# Patient Record
Sex: Female | Born: 1950 | Race: White | Hispanic: No | Smoking: Current every day smoker
Health system: Southern US, Community
[De-identification: ages and names within clinical notes are randomized; demographics above are authoritative.]

## PROBLEM LIST (undated history)

## (undated) DIAGNOSIS — K267 Chronic duodenal ulcer without hemorrhage or perforation: Secondary | ICD-10-CM

## (undated) DIAGNOSIS — M1990 Unspecified osteoarthritis, unspecified site: Secondary | ICD-10-CM

## (undated) DIAGNOSIS — R45851 Suicidal ideations: Secondary | ICD-10-CM

## (undated) DIAGNOSIS — J449 Chronic obstructive pulmonary disease, unspecified: Secondary | ICD-10-CM

## (undated) DIAGNOSIS — F419 Anxiety disorder, unspecified: Secondary | ICD-10-CM

## (undated) DIAGNOSIS — M545 Low back pain, unspecified: Secondary | ICD-10-CM

## (undated) DIAGNOSIS — M5430 Sciatica, unspecified side: Secondary | ICD-10-CM

## (undated) DIAGNOSIS — Z9289 Personal history of other medical treatment: Secondary | ICD-10-CM

## (undated) DIAGNOSIS — J189 Pneumonia, unspecified organism: Secondary | ICD-10-CM

## (undated) DIAGNOSIS — F329 Major depressive disorder, single episode, unspecified: Secondary | ICD-10-CM

## (undated) DIAGNOSIS — R7989 Other specified abnormal findings of blood chemistry: Secondary | ICD-10-CM

## (undated) DIAGNOSIS — Z9981 Dependence on supplemental oxygen: Secondary | ICD-10-CM

## (undated) DIAGNOSIS — R0602 Shortness of breath: Secondary | ICD-10-CM

## (undated) DIAGNOSIS — M810 Age-related osteoporosis without current pathological fracture: Secondary | ICD-10-CM

## (undated) DIAGNOSIS — R945 Abnormal results of liver function studies: Secondary | ICD-10-CM

## (undated) DIAGNOSIS — F319 Bipolar disorder, unspecified: Secondary | ICD-10-CM

## (undated) DIAGNOSIS — G894 Chronic pain syndrome: Secondary | ICD-10-CM

## (undated) DIAGNOSIS — E0590 Thyrotoxicosis, unspecified without thyrotoxic crisis or storm: Secondary | ICD-10-CM

## (undated) DIAGNOSIS — G8929 Other chronic pain: Secondary | ICD-10-CM

## (undated) DIAGNOSIS — F32A Depression, unspecified: Secondary | ICD-10-CM

## (undated) DIAGNOSIS — D649 Anemia, unspecified: Secondary | ICD-10-CM

## (undated) DIAGNOSIS — E559 Vitamin D deficiency, unspecified: Secondary | ICD-10-CM

## (undated) DIAGNOSIS — F1910 Other psychoactive substance abuse, uncomplicated: Secondary | ICD-10-CM

## (undated) HISTORY — DX: Other specified abnormal findings of blood chemistry: R79.89

## (undated) HISTORY — DX: Abnormal results of liver function studies: R94.5

## (undated) HISTORY — DX: Anemia, unspecified: D64.9

## (undated) HISTORY — DX: Major depressive disorder, single episode, unspecified: F32.9

## (undated) HISTORY — DX: Other psychoactive substance abuse, uncomplicated: F19.10

## (undated) HISTORY — PX: FRACTURE SURGERY: SHX138

## (undated) HISTORY — PX: AUGMENTATION MAMMAPLASTY: SUR837

## (undated) HISTORY — DX: Sciatica, unspecified side: M54.30

## (undated) HISTORY — DX: Suicidal ideations: R45.851

## (undated) HISTORY — DX: Age-related osteoporosis without current pathological fracture: M81.0

## (undated) HISTORY — PX: CATARACT EXTRACTION W/ INTRAOCULAR LENS  IMPLANT, BILATERAL: SHX1307

## (undated) HISTORY — DX: Bipolar disorder, unspecified: F31.9

## (undated) HISTORY — DX: Depression, unspecified: F32.A

---

## 1977-03-14 HISTORY — PX: TUBAL LIGATION: SHX77

## 1999-08-30 ENCOUNTER — Encounter: Payer: Self-pay | Admitting: Emergency Medicine

## 1999-08-30 ENCOUNTER — Emergency Department (HOSPITAL_COMMUNITY): Admission: EM | Admit: 1999-08-30 | Discharge: 1999-08-30 | Payer: Self-pay | Admitting: Emergency Medicine

## 2005-03-14 DIAGNOSIS — R45851 Suicidal ideations: Secondary | ICD-10-CM

## 2005-03-14 HISTORY — DX: Suicidal ideations: R45.851

## 2005-12-02 ENCOUNTER — Ambulatory Visit: Payer: Self-pay | Admitting: Hospitalist

## 2005-12-02 ENCOUNTER — Ambulatory Visit (HOSPITAL_COMMUNITY): Admission: RE | Admit: 2005-12-02 | Discharge: 2005-12-02 | Payer: Self-pay | Admitting: Hospitalist

## 2005-12-06 ENCOUNTER — Ambulatory Visit: Payer: Self-pay | Admitting: Hospitalist

## 2005-12-07 ENCOUNTER — Encounter: Payer: Self-pay | Admitting: Internal Medicine

## 2005-12-07 DIAGNOSIS — M549 Dorsalgia, unspecified: Secondary | ICD-10-CM

## 2005-12-07 DIAGNOSIS — G8929 Other chronic pain: Secondary | ICD-10-CM

## 2005-12-09 ENCOUNTER — Ambulatory Visit: Payer: Self-pay | Admitting: Internal Medicine

## 2005-12-09 ENCOUNTER — Ambulatory Visit (HOSPITAL_COMMUNITY): Admission: RE | Admit: 2005-12-09 | Discharge: 2005-12-09 | Payer: Self-pay | Admitting: Internal Medicine

## 2005-12-20 ENCOUNTER — Ambulatory Visit: Payer: Self-pay | Admitting: Internal Medicine

## 2006-01-03 ENCOUNTER — Ambulatory Visit: Payer: Self-pay | Admitting: Internal Medicine

## 2006-01-03 ENCOUNTER — Encounter (INDEPENDENT_AMBULATORY_CARE_PROVIDER_SITE_OTHER): Payer: Self-pay | Admitting: Pulmonary Disease

## 2006-01-03 LAB — CONVERTED CEMR LAB
HCT: 43 % (ref 36.0–46.0)
Leukocyte count, blood: 5.3 10*9/L (ref 4.0–10.5)
MCV: 92.1 fL (ref 78.0–100.0)
Platelets: 362 10*3/uL (ref 150–400)
RBC: 4.67 M/uL (ref 3.87–5.11)

## 2006-01-25 ENCOUNTER — Ambulatory Visit: Payer: Self-pay | Admitting: Internal Medicine

## 2006-06-01 ENCOUNTER — Ambulatory Visit: Payer: Self-pay | Admitting: Internal Medicine

## 2006-06-01 ENCOUNTER — Encounter (INDEPENDENT_AMBULATORY_CARE_PROVIDER_SITE_OTHER): Payer: Self-pay | Admitting: Internal Medicine

## 2006-06-12 ENCOUNTER — Telehealth: Payer: Self-pay | Admitting: *Deleted

## 2006-10-23 ENCOUNTER — Telehealth: Payer: Self-pay | Admitting: *Deleted

## 2006-11-20 ENCOUNTER — Telehealth (INDEPENDENT_AMBULATORY_CARE_PROVIDER_SITE_OTHER): Payer: Self-pay | Admitting: *Deleted

## 2007-01-01 ENCOUNTER — Encounter (INDEPENDENT_AMBULATORY_CARE_PROVIDER_SITE_OTHER): Payer: Self-pay | Admitting: Internal Medicine

## 2007-01-01 ENCOUNTER — Ambulatory Visit: Payer: Self-pay | Admitting: *Deleted

## 2007-01-03 ENCOUNTER — Encounter (INDEPENDENT_AMBULATORY_CARE_PROVIDER_SITE_OTHER): Payer: Self-pay | Admitting: Internal Medicine

## 2007-01-03 DIAGNOSIS — E559 Vitamin D deficiency, unspecified: Secondary | ICD-10-CM

## 2007-01-03 HISTORY — DX: Vitamin D deficiency, unspecified: E55.9

## 2007-01-03 LAB — CONVERTED CEMR LAB
Calcium: 8.6 mg/dL (ref 8.4–10.5)
Creatinine, Ser: 0.66 mg/dL (ref 0.40–1.20)
HCT: 40 % (ref 36.0–46.0)
MCHC: 33.8 g/dL (ref 30.0–36.0)
MCV: 92 fL (ref 78.0–100.0)
Platelets: 355 10*3/uL (ref 150–400)
RDW: 12.8 % (ref 11.5–14.0)

## 2007-02-18 ENCOUNTER — Encounter (INDEPENDENT_AMBULATORY_CARE_PROVIDER_SITE_OTHER): Payer: Self-pay | Admitting: Internal Medicine

## 2007-03-01 ENCOUNTER — Ambulatory Visit (HOSPITAL_COMMUNITY): Admission: RE | Admit: 2007-03-01 | Discharge: 2007-03-01 | Payer: Self-pay | Admitting: Internal Medicine

## 2007-04-19 ENCOUNTER — Encounter (INDEPENDENT_AMBULATORY_CARE_PROVIDER_SITE_OTHER): Payer: Self-pay | Admitting: Internal Medicine

## 2007-04-19 ENCOUNTER — Ambulatory Visit: Payer: Self-pay | Admitting: Hospitalist

## 2007-05-18 ENCOUNTER — Telehealth: Payer: Self-pay | Admitting: *Deleted

## 2007-05-23 ENCOUNTER — Encounter: Admission: RE | Admit: 2007-05-23 | Discharge: 2007-05-23 | Payer: Self-pay | Admitting: Sports Medicine

## 2007-06-11 ENCOUNTER — Encounter: Admission: RE | Admit: 2007-06-11 | Discharge: 2007-06-11 | Payer: Self-pay | Admitting: Sports Medicine

## 2007-10-19 ENCOUNTER — Encounter (INDEPENDENT_AMBULATORY_CARE_PROVIDER_SITE_OTHER): Payer: Self-pay | Admitting: Internal Medicine

## 2007-10-19 ENCOUNTER — Ambulatory Visit: Payer: Self-pay | Admitting: Internal Medicine

## 2007-10-20 LAB — CONVERTED CEMR LAB
ALT: 13 units/L (ref 0–35)
Albumin: 4.5 g/dL (ref 3.5–5.2)
Alkaline Phosphatase: 167 units/L — ABNORMAL HIGH (ref 39–117)
CO2: 23 meq/L (ref 19–32)
Cholesterol: 210 mg/dL — ABNORMAL HIGH (ref 0–200)
Glucose, Bld: 103 mg/dL — ABNORMAL HIGH (ref 70–99)
LDL Cholesterol: 125 mg/dL — ABNORMAL HIGH (ref 0–99)
Potassium: 4 meq/L (ref 3.5–5.3)
RBC: 4.22 M/uL (ref 3.87–5.11)
Sodium: 137 meq/L (ref 135–145)
Total Protein: 7 g/dL (ref 6.0–8.3)
Triglycerides: 51 mg/dL (ref <150)
WBC: 12.9 10*3/uL — ABNORMAL HIGH (ref 4.0–10.5)

## 2007-12-03 ENCOUNTER — Ambulatory Visit: Payer: Self-pay | Admitting: Internal Medicine

## 2007-12-03 ENCOUNTER — Encounter (INDEPENDENT_AMBULATORY_CARE_PROVIDER_SITE_OTHER): Payer: Self-pay | Admitting: Internal Medicine

## 2007-12-31 ENCOUNTER — Telehealth (INDEPENDENT_AMBULATORY_CARE_PROVIDER_SITE_OTHER): Payer: Self-pay | Admitting: Internal Medicine

## 2008-01-01 ENCOUNTER — Encounter (INDEPENDENT_AMBULATORY_CARE_PROVIDER_SITE_OTHER): Payer: Self-pay | Admitting: Internal Medicine

## 2008-01-16 ENCOUNTER — Encounter (INDEPENDENT_AMBULATORY_CARE_PROVIDER_SITE_OTHER): Payer: Self-pay | Admitting: Internal Medicine

## 2008-02-26 ENCOUNTER — Ambulatory Visit: Payer: Self-pay | Admitting: Internal Medicine

## 2008-02-26 ENCOUNTER — Encounter (INDEPENDENT_AMBULATORY_CARE_PROVIDER_SITE_OTHER): Payer: Self-pay | Admitting: Internal Medicine

## 2008-03-28 ENCOUNTER — Encounter (INDEPENDENT_AMBULATORY_CARE_PROVIDER_SITE_OTHER): Payer: Self-pay | Admitting: Internal Medicine

## 2008-03-28 ENCOUNTER — Ambulatory Visit: Payer: Self-pay | Admitting: Infectious Disease

## 2008-03-28 LAB — HM PAP SMEAR: HM Pap smear: NEGATIVE

## 2008-03-29 ENCOUNTER — Encounter (INDEPENDENT_AMBULATORY_CARE_PROVIDER_SITE_OTHER): Payer: Self-pay | Admitting: Internal Medicine

## 2008-03-29 LAB — CONVERTED CEMR LAB
Candida species: NEGATIVE
Gardnerella vaginalis: NEGATIVE
Trichomonal Vaginitis: NEGATIVE

## 2008-04-15 ENCOUNTER — Encounter: Payer: Self-pay | Admitting: Licensed Clinical Social Worker

## 2008-04-30 ENCOUNTER — Telehealth (INDEPENDENT_AMBULATORY_CARE_PROVIDER_SITE_OTHER): Payer: Self-pay | Admitting: Internal Medicine

## 2008-04-30 ENCOUNTER — Encounter (INDEPENDENT_AMBULATORY_CARE_PROVIDER_SITE_OTHER): Payer: Self-pay | Admitting: Internal Medicine

## 2008-05-12 ENCOUNTER — Ambulatory Visit: Payer: Self-pay | Admitting: Internal Medicine

## 2008-05-12 ENCOUNTER — Encounter (INDEPENDENT_AMBULATORY_CARE_PROVIDER_SITE_OTHER): Payer: Self-pay | Admitting: Internal Medicine

## 2008-05-15 ENCOUNTER — Telehealth: Payer: Self-pay | Admitting: Licensed Clinical Social Worker

## 2008-05-30 ENCOUNTER — Telehealth (INDEPENDENT_AMBULATORY_CARE_PROVIDER_SITE_OTHER): Payer: Self-pay | Admitting: *Deleted

## 2008-05-30 ENCOUNTER — Encounter: Payer: Self-pay | Admitting: Internal Medicine

## 2008-06-04 ENCOUNTER — Encounter (INDEPENDENT_AMBULATORY_CARE_PROVIDER_SITE_OTHER): Payer: Self-pay | Admitting: Internal Medicine

## 2008-06-04 ENCOUNTER — Telehealth (INDEPENDENT_AMBULATORY_CARE_PROVIDER_SITE_OTHER): Payer: Self-pay | Admitting: Internal Medicine

## 2008-06-23 ENCOUNTER — Ambulatory Visit: Payer: Self-pay | Admitting: Internal Medicine

## 2008-07-08 ENCOUNTER — Ambulatory Visit: Payer: Self-pay | Admitting: Internal Medicine

## 2008-07-08 ENCOUNTER — Encounter (INDEPENDENT_AMBULATORY_CARE_PROVIDER_SITE_OTHER): Payer: Self-pay | Admitting: Internal Medicine

## 2008-07-11 ENCOUNTER — Encounter (INDEPENDENT_AMBULATORY_CARE_PROVIDER_SITE_OTHER): Payer: Self-pay | Admitting: Internal Medicine

## 2008-07-24 ENCOUNTER — Telehealth (INDEPENDENT_AMBULATORY_CARE_PROVIDER_SITE_OTHER): Payer: Self-pay | Admitting: Internal Medicine

## 2008-07-24 ENCOUNTER — Encounter (INDEPENDENT_AMBULATORY_CARE_PROVIDER_SITE_OTHER): Payer: Self-pay | Admitting: Internal Medicine

## 2008-08-07 ENCOUNTER — Ambulatory Visit: Payer: Self-pay | Admitting: *Deleted

## 2008-08-07 ENCOUNTER — Ambulatory Visit (HOSPITAL_COMMUNITY): Admission: RE | Admit: 2008-08-07 | Discharge: 2008-08-07 | Payer: Self-pay | Admitting: *Deleted

## 2008-08-21 ENCOUNTER — Encounter (HOSPITAL_COMMUNITY): Admission: RE | Admit: 2008-08-21 | Discharge: 2008-11-19 | Payer: Self-pay | Admitting: Orthopedic Surgery

## 2008-08-25 ENCOUNTER — Ambulatory Visit: Payer: Self-pay | Admitting: Internal Medicine

## 2008-09-04 ENCOUNTER — Encounter: Admission: RE | Admit: 2008-09-04 | Discharge: 2008-09-04 | Payer: Self-pay | Admitting: Orthopedic Surgery

## 2008-09-24 ENCOUNTER — Telehealth: Payer: Self-pay | Admitting: *Deleted

## 2008-09-29 ENCOUNTER — Encounter (INDEPENDENT_AMBULATORY_CARE_PROVIDER_SITE_OTHER): Payer: Self-pay | Admitting: Internal Medicine

## 2008-09-29 ENCOUNTER — Ambulatory Visit: Payer: Self-pay | Admitting: Internal Medicine

## 2008-09-29 LAB — CONVERTED CEMR LAB: HDL: 73 mg/dL (ref >39)

## 2008-10-06 ENCOUNTER — Ambulatory Visit (HOSPITAL_COMMUNITY): Admission: RE | Admit: 2008-10-06 | Discharge: 2008-10-06 | Payer: Self-pay | Admitting: Internal Medicine

## 2008-10-06 LAB — HM MAMMOGRAPHY

## 2008-10-23 ENCOUNTER — Telehealth: Payer: Self-pay | Admitting: *Deleted

## 2008-10-30 ENCOUNTER — Encounter: Admission: RE | Admit: 2008-10-30 | Discharge: 2008-10-30 | Payer: Self-pay | Admitting: Orthopedic Surgery

## 2008-10-30 ENCOUNTER — Encounter (INDEPENDENT_AMBULATORY_CARE_PROVIDER_SITE_OTHER): Payer: Self-pay | Admitting: Internal Medicine

## 2008-11-04 ENCOUNTER — Encounter (INDEPENDENT_AMBULATORY_CARE_PROVIDER_SITE_OTHER): Payer: Self-pay | Admitting: Internal Medicine

## 2008-11-25 ENCOUNTER — Encounter
Admission: RE | Admit: 2008-11-25 | Discharge: 2009-02-11 | Payer: Self-pay | Admitting: Physical Medicine & Rehabilitation

## 2008-11-27 ENCOUNTER — Ambulatory Visit: Payer: Self-pay | Admitting: Physical Medicine & Rehabilitation

## 2008-12-11 ENCOUNTER — Encounter (INDEPENDENT_AMBULATORY_CARE_PROVIDER_SITE_OTHER): Payer: Self-pay | Admitting: Internal Medicine

## 2008-12-15 ENCOUNTER — Ambulatory Visit: Payer: Self-pay | Admitting: Physical Medicine & Rehabilitation

## 2009-01-09 ENCOUNTER — Ambulatory Visit: Payer: Self-pay | Admitting: Internal Medicine

## 2009-01-12 ENCOUNTER — Ambulatory Visit: Payer: Self-pay | Admitting: Physical Medicine & Rehabilitation

## 2009-01-13 ENCOUNTER — Telehealth (INDEPENDENT_AMBULATORY_CARE_PROVIDER_SITE_OTHER): Payer: Self-pay | Admitting: Internal Medicine

## 2009-01-14 ENCOUNTER — Ambulatory Visit: Payer: Self-pay | Admitting: Infectious Disease

## 2009-02-11 ENCOUNTER — Encounter
Admission: RE | Admit: 2009-02-11 | Discharge: 2009-03-05 | Payer: Self-pay | Admitting: Physical Medicine & Rehabilitation

## 2009-02-12 ENCOUNTER — Ambulatory Visit: Payer: Self-pay | Admitting: Physical Medicine & Rehabilitation

## 2009-03-16 ENCOUNTER — Encounter
Admission: RE | Admit: 2009-03-16 | Discharge: 2009-05-11 | Payer: Self-pay | Admitting: Physical Medicine & Rehabilitation

## 2009-03-16 ENCOUNTER — Ambulatory Visit: Payer: Self-pay | Admitting: Physical Medicine & Rehabilitation

## 2009-04-15 ENCOUNTER — Ambulatory Visit: Payer: Self-pay | Admitting: Physical Medicine & Rehabilitation

## 2009-05-11 ENCOUNTER — Encounter
Admission: RE | Admit: 2009-05-11 | Discharge: 2009-08-09 | Payer: Self-pay | Admitting: Physical Medicine & Rehabilitation

## 2009-05-13 ENCOUNTER — Ambulatory Visit: Payer: Self-pay | Admitting: Physical Medicine & Rehabilitation

## 2009-06-02 ENCOUNTER — Ambulatory Visit: Payer: Self-pay | Admitting: Internal Medicine

## 2009-06-02 DIAGNOSIS — F3130 Bipolar disorder, current episode depressed, mild or moderate severity, unspecified: Secondary | ICD-10-CM

## 2009-06-02 LAB — CONVERTED CEMR LAB: Hgb A1c MFr Bld: 5.7 %

## 2009-06-03 LAB — CONVERTED CEMR LAB
ALT: 13 units/L (ref 0–35)
AST: 17 units/L (ref 0–37)
Albumin: 4.4 g/dL (ref 3.5–5.2)
CO2: 24 meq/L (ref 19–32)
Calcium: 8.8 mg/dL (ref 8.4–10.5)
Chloride: 97 meq/L (ref 96–112)
Cholesterol: 176 mg/dL (ref 0–200)
Creatinine, Ser: 0.6 mg/dL (ref 0.40–1.20)
Potassium: 4.6 meq/L (ref 3.5–5.3)
TSH: 1.079 microintl units/mL (ref 0.350–4.5)
Total CHOL/HDL Ratio: 2.4
Total Protein: 6.6 g/dL (ref 6.0–8.3)
Vit D, 25-Hydroxy: <10 ng/mL — ABNORMAL LOW (ref 30–89)

## 2009-06-04 ENCOUNTER — Ambulatory Visit (HOSPITAL_COMMUNITY): Admission: RE | Admit: 2009-06-04 | Discharge: 2009-06-04 | Payer: Self-pay | Admitting: Internal Medicine

## 2009-06-08 ENCOUNTER — Ambulatory Visit: Payer: Self-pay | Admitting: Physical Medicine & Rehabilitation

## 2009-06-11 ENCOUNTER — Encounter (INDEPENDENT_AMBULATORY_CARE_PROVIDER_SITE_OTHER): Payer: Self-pay | Admitting: Internal Medicine

## 2009-06-17 DIAGNOSIS — M810 Age-related osteoporosis without current pathological fracture: Secondary | ICD-10-CM

## 2009-06-17 HISTORY — DX: Age-related osteoporosis without current pathological fracture: M81.0

## 2009-07-08 ENCOUNTER — Ambulatory Visit: Payer: Self-pay | Admitting: Physical Medicine & Rehabilitation

## 2009-07-31 ENCOUNTER — Ambulatory Visit: Payer: Self-pay | Admitting: Physical Medicine & Rehabilitation

## 2009-08-28 ENCOUNTER — Encounter
Admission: RE | Admit: 2009-08-28 | Discharge: 2009-11-26 | Payer: Self-pay | Admitting: Physical Medicine & Rehabilitation

## 2009-09-03 ENCOUNTER — Ambulatory Visit: Payer: Self-pay | Admitting: Physical Medicine & Rehabilitation

## 2009-09-30 ENCOUNTER — Ambulatory Visit: Payer: Self-pay | Admitting: Physical Medicine & Rehabilitation

## 2009-10-16 ENCOUNTER — Encounter: Payer: Self-pay | Admitting: Internal Medicine

## 2009-10-16 ENCOUNTER — Ambulatory Visit: Payer: Self-pay | Admitting: Internal Medicine

## 2009-10-16 ENCOUNTER — Encounter: Payer: Self-pay | Admitting: Licensed Clinical Social Worker

## 2009-10-16 DIAGNOSIS — M549 Dorsalgia, unspecified: Secondary | ICD-10-CM | POA: Insufficient documentation

## 2009-10-16 LAB — CONVERTED CEMR LAB: Vit D, 25-Hydroxy: 42 ng/mL (ref 30–89)

## 2009-10-28 ENCOUNTER — Ambulatory Visit: Payer: Self-pay | Admitting: Physical Medicine & Rehabilitation

## 2009-11-24 ENCOUNTER — Encounter
Admission: RE | Admit: 2009-11-24 | Discharge: 2010-02-22 | Payer: Self-pay | Source: Home / Self Care | Attending: Physical Medicine & Rehabilitation | Admitting: Physical Medicine & Rehabilitation

## 2009-11-27 ENCOUNTER — Ambulatory Visit: Payer: Self-pay | Admitting: Physical Medicine & Rehabilitation

## 2009-12-15 ENCOUNTER — Ambulatory Visit: Payer: Self-pay | Admitting: Physical Medicine & Rehabilitation

## 2010-01-18 ENCOUNTER — Ambulatory Visit: Payer: Self-pay | Admitting: Physical Medicine & Rehabilitation

## 2010-01-28 ENCOUNTER — Telehealth: Payer: Self-pay | Admitting: Licensed Clinical Social Worker

## 2010-02-02 ENCOUNTER — Telehealth: Payer: Self-pay | Admitting: Licensed Clinical Social Worker

## 2010-02-15 ENCOUNTER — Ambulatory Visit: Payer: Self-pay | Admitting: Physical Medicine & Rehabilitation

## 2010-03-23 ENCOUNTER — Encounter: Payer: Self-pay | Admitting: Ophthalmology

## 2010-03-23 ENCOUNTER — Encounter
Admission: RE | Admit: 2010-03-23 | Discharge: 2010-04-13 | Payer: Self-pay | Source: Home / Self Care | Attending: Physical Medicine & Rehabilitation | Admitting: Physical Medicine & Rehabilitation

## 2010-03-23 ENCOUNTER — Inpatient Hospital Stay (HOSPITAL_COMMUNITY)
Admission: EM | Admit: 2010-03-23 | Discharge: 2010-03-25 | Payer: Self-pay | Source: Home / Self Care | Attending: Internal Medicine | Admitting: Internal Medicine

## 2010-03-24 ENCOUNTER — Ambulatory Visit: Admit: 2010-03-24 | Payer: Self-pay

## 2010-03-25 ENCOUNTER — Encounter: Payer: Self-pay | Admitting: Ophthalmology

## 2010-03-25 ENCOUNTER — Ambulatory Visit: Admit: 2010-03-25 | Payer: Self-pay | Admitting: Physical Medicine & Rehabilitation

## 2010-03-25 DIAGNOSIS — I6200 Nontraumatic subdural hemorrhage, unspecified: Secondary | ICD-10-CM | POA: Insufficient documentation

## 2010-03-29 LAB — URINALYSIS, ROUTINE W REFLEX MICROSCOPIC
Bilirubin Urine: NEGATIVE
Ketones, ur: NEGATIVE mg/dL
Leukocytes, UA: NEGATIVE
Nitrite: NEGATIVE
Protein, ur: NEGATIVE mg/dL
Specific Gravity, Urine: 1.006 (ref 1.005–1.030)
Urine Glucose, Fasting: NEGATIVE mg/dL
Urobilinogen, UA: 0.2 mg/dL (ref 0.0–1.0)
pH: 7.5 (ref 5.0–8.0)

## 2010-03-29 LAB — POCT I-STAT, CHEM 8
BUN: <3 mg/dL — ABNORMAL LOW (ref 6–23)
Calcium, Ion: 1.05 mmol/L — ABNORMAL LOW (ref 1.12–1.32)
Chloride: 96 mEq/L (ref 96–112)
Creatinine, Ser: 0.8 mg/dL (ref 0.4–1.2)
Glucose, Bld: 91 mg/dL (ref 70–99)
HCT: 40 % (ref 36.0–46.0)
Hemoglobin: 13.6 g/dL (ref 12.0–15.0)
Potassium: 4.2 mEq/L (ref 3.5–5.1)
Sodium: 140 mEq/L (ref 135–145)
TCO2: 37 mmol/L (ref 0–100)

## 2010-03-29 LAB — COMPREHENSIVE METABOLIC PANEL
ALT: 17 U/L (ref 0–35)
ALT: 14 U/L (ref 0–35)
AST: 20 U/L (ref 0–37)
AST: 15 U/L (ref 0–37)
Albumin: 3.4 g/dL — ABNORMAL LOW (ref 3.5–5.2)
Albumin: 2.9 g/dL — ABNORMAL LOW (ref 3.5–5.2)
Alkaline Phosphatase: 101 U/L (ref 39–117)
Alkaline Phosphatase: 93 U/L (ref 39–117)
BUN: 2 mg/dL — ABNORMAL LOW (ref 6–23)
BUN: 6 mg/dL (ref 6–23)
CO2: 33 mEq/L — ABNORMAL HIGH (ref 19–32)
CO2: 31 mEq/L (ref 19–32)
Calcium: 8.6 mg/dL (ref 8.4–10.5)
Calcium: 7.9 mg/dL — ABNORMAL LOW (ref 8.4–10.5)
Chloride: 98 mEq/L (ref 96–112)
Chloride: 102 mEq/L (ref 96–112)
Creatinine, Ser: 0.56 mg/dL (ref 0.4–1.2)
Creatinine, Ser: 0.55 mg/dL (ref 0.4–1.2)
GFR calc Af Amer: >60 mL/min (ref >60)
GFR calc Af Amer: >60 mL/min (ref >60)
GFR calc non Af Amer: >60 mL/min (ref >60)
GFR calc non Af Amer: >60 mL/min (ref >60)
Glucose, Bld: 93 mg/dL (ref 70–99)
Glucose, Bld: 83 mg/dL (ref 70–99)
Potassium: 4.3 mEq/L (ref 3.5–5.1)
Potassium: 3.9 mEq/L (ref 3.5–5.1)
Sodium: 141 mEq/L (ref 135–145)
Sodium: 142 mEq/L (ref 135–145)
Total Bilirubin: 0.5 mg/dL (ref 0.3–1.2)
Total Bilirubin: 0.5 mg/dL (ref 0.3–1.2)
Total Protein: 6.7 g/dL (ref 6.0–8.3)
Total Protein: 6 g/dL (ref 6.0–8.3)

## 2010-03-29 LAB — CBC
HCT: 39.3 % (ref 36.0–46.0)
HCT: 37.9 % (ref 36.0–46.0)
Hemoglobin: 13.1 g/dL (ref 12.0–15.0)
Hemoglobin: 12.4 g/dL (ref 12.0–15.0)
MCH: 31.3 pg (ref 26.0–34.0)
MCH: 31.2 pg (ref 26.0–34.0)
MCHC: 33.3 g/dL (ref 30.0–36.0)
MCHC: 32.7 g/dL (ref 30.0–36.0)
MCV: 93.8 fL (ref 78.0–100.0)
MCV: 95.5 fL (ref 78.0–100.0)
Platelets: 371 10*3/uL (ref 150–400)
Platelets: 381 10*3/uL (ref 150–400)
RBC: 4.19 MIL/uL (ref 3.87–5.11)
RBC: 3.97 MIL/uL (ref 3.87–5.11)
RDW: 13.3 % (ref 11.5–15.5)
RDW: 13.5 % (ref 11.5–15.5)
WBC: 9.8 10*3/uL (ref 4.0–10.5)
WBC: 8.3 10*3/uL (ref 4.0–10.5)

## 2010-03-29 LAB — RAPID URINE DRUG SCREEN, HOSP PERFORMED
Amphetamines: NOT DETECTED
Barbiturates: NOT DETECTED
Benzodiazepines: NOT DETECTED
Cocaine: NOT DETECTED
Opiates: POSITIVE — AB
Tetrahydrocannabinol: NOT DETECTED

## 2010-03-29 LAB — DIFFERENTIAL
Basophils Absolute: 0 10*3/uL (ref 0.0–0.1)
Basophils Relative: 0 % (ref 0–1)
Eosinophils Absolute: 0.1 10*3/uL (ref 0.0–0.7)
Eosinophils Relative: 1 % (ref 0–5)
Lymphocytes Relative: 20 % (ref 12–46)
Lymphs Abs: 1.9 10*3/uL (ref 0.7–4.0)
Monocytes Absolute: 1 10*3/uL (ref 0.1–1.0)
Monocytes Relative: 10 % (ref 3–12)
Neutro Abs: 6.7 10*3/uL (ref 1.7–7.7)
Neutrophils Relative %: 69 % (ref 43–77)

## 2010-03-29 LAB — CARDIAC PANEL(CRET KIN+CKTOT+MB+TROPI)
CK, MB: 1.4 ng/mL (ref 0.3–4.0)
CK, MB: 1.4 ng/mL (ref 0.3–4.0)
Relative Index: 1.1 (ref 0.0–2.5)
Relative Index: 1.3 (ref 0.0–2.5)
Total CK: 124 U/L (ref 7–177)
Total CK: 109 U/L (ref 7–177)
Troponin I: 0.01 ng/mL (ref 0.00–0.06)
Troponin I: 0.01 ng/mL (ref 0.00–0.06)

## 2010-03-29 LAB — ACETAMINOPHEN LEVEL: Acetaminophen (Tylenol), Serum: <10 ug/mL — ABNORMAL LOW (ref 10–30)

## 2010-03-29 LAB — PROTIME-INR
INR: 0.96 (ref 0.00–1.49)
Prothrombin Time: 13 seconds (ref 11.6–15.2)

## 2010-03-29 LAB — ETHANOL: Alcohol, Ethyl (B): <5 mg/dL (ref 0–10)

## 2010-03-29 LAB — POCT CARDIAC MARKERS
CKMB, poc: 2.4 ng/mL (ref 1.0–8.0)
CKMB, poc: 2.2 ng/mL (ref 1.0–8.0)
Myoglobin, poc: 133 ng/mL (ref 12–200)
Myoglobin, poc: 154 ng/mL (ref 12–200)
Troponin i, poc: <0.05 ng/mL (ref 0.00–0.09)
Troponin i, poc: <0.05 ng/mL (ref 0.00–0.09)

## 2010-03-29 LAB — MAGNESIUM: Magnesium: 2 mg/dL (ref 1.5–2.5)

## 2010-03-29 LAB — TSH: TSH: 0.419 u[IU]/mL (ref 0.350–4.500)

## 2010-03-29 LAB — SALICYLATE LEVEL: Salicylate Lvl: <4 mg/dL (ref 2.8–20.0)

## 2010-03-29 LAB — MRSA PCR SCREENING: MRSA by PCR: NEGATIVE

## 2010-03-29 LAB — URINE MICROSCOPIC-ADD ON

## 2010-04-02 ENCOUNTER — Ambulatory Visit: Admission: RE | Admit: 2010-04-02 | Discharge: 2010-04-02 | Payer: Self-pay | Source: Home / Self Care

## 2010-04-02 DIAGNOSIS — R4182 Altered mental status, unspecified: Secondary | ICD-10-CM | POA: Insufficient documentation

## 2010-04-04 ENCOUNTER — Encounter: Payer: Self-pay | Admitting: Internal Medicine

## 2010-04-05 ENCOUNTER — Encounter: Payer: Self-pay | Admitting: Internal Medicine

## 2010-04-05 ENCOUNTER — Encounter: Payer: Self-pay | Admitting: Orthopedic Surgery

## 2010-04-10 NOTE — Discharge Summary (Signed)
[name], [name]                ACCOUNT NO.:  [id number]  MEDICAL RECORD NO.:  [id number]          PATIENT TYPE:  INP  LOCATION:  2601                         FACILITY:  [facility]  PHYSICIAN:  Mariea Stable, MD   DATE OF BIRTH:  [birth year]  DATE OF ADMISSION:  03/23/2010 DATE OF DISCHARGE:  03/25/2010                              DISCHARGE SUMMARY   DISCHARGE DIAGNOSES: 1. Altered mental status secondary to opioid over use. 2. Shortness of breath. 3. Depression/bipolar disorder. 4. Chronic hip and shoulder pain.  DISCHARGE MEDICATIONS: 1. Zyprexa 20 mg to take 1 tablet at bedtime. 2. Lamictal 100 mg tabs, take 1 tablet in the morning and 2 tablets at     night. 3. Cymbalta 30 mg tabs, take 1 tablet twice daily. 4. MS Contin 30 mg XR 12H tab, take 1 tablet twice a day by mouth. 5. Calcium carbonate 600 mg tabs, take 1 tablet by mouth 2 times     daily. 6. Alendronate 35 mg tabs, take 1 tablet by mouth every week, 30     minutes before food/drink/meds and avoid lying down for 30 minutes     after taking meds. 7. Methocarbamol 500 mg tabs, take 1 tablet by mouth 3 times a day. 8 . Vitamin B1 1000 units tabs, 2 tabs daily. 1. Ventolin HFA 109 mcg/Act aers, take 1 puff every 6 hours as needed     for wheezing.  PROCEDURES PERFORMED: 1. A 2-view chest x-ray was performed on March 23, 2010 which     demonstrated chronic obstructive pulmonary disease with advanced     upper lobe emphysematous changes.  No definite acute findings. 2. CT of the head without contrast was performed on March 23, 2010     which demonstrated a right-sided scalp hematoma without underlying     skull fracture.  There were subtle signs of extra-axial fluid     collection over the right frontal region.  There was also less     visible subarachnoid space on the right versus the left though the     significance of this was uncertain.  CONSULTATIONS:  None.  BRIEF ADMITTING HISTORY AND PHYSICAL:  This  is a 60 year old female with past medical history significant for bipolar disorder, depressive subtype, chronic pain in the shoulder and hip joints as well as OCD who presents with altered mental status after taking 8 pills of morphine 30 mg tabs.  The patient denies denying to hurt or harm herself and only claims to remember that her brother brought her to the hospital.  The patient on presentation also complained of some mild shortness of breath over the past 3 days, but little further history was able to be obtained secondary to altered mental status.  Per nursing report, the patient did have a mechanical fall during the week prior to her admission where she hit her head.  A CT scan was performed in the outside hospital in August which demonstrated a subdural hematoma.  On admission, the patient denied any chest pain or changes in her bowel movements as well as any fever or chills.  ALLERGIES:  No known drug allergies.  PAST MEDICAL HISTORY: 1. Bipolar disorder with depression. 2. Chronic hip, right shoulder and right hip pain with significant     spinal pathology managed by Dr. Wynn Banker. 3. OCD 4. Poor dentition. 5. History of suicidal ideation with a plan back in September 2007. 6. Seasonal allergies.  PHYSICAL EXAMINATION:  VITAL SIGNS ON ADMISSION:  Temperature 99.4, pulse 83, blood pressure 110/63, respiratory rate 19, O2 sat 94% on room air. GENERAL:  Limited to cooperative examination.  The patient was difficult to arouse.  She appears older than stated age. HEAD:  Healing laceration on the right temporal portion of the face and extending around the eye. EYES:  Pupils round, reactive to light and accommodation.  Vision grossly intact. MOUTH:  Oropharynx pink and moist.  No erythema or exudates. NECK:  Supple.  Full range of motion.  No thyromegaly or JVD. LUNGS:  Bilaterally clear to auscultation.  There were mild wheezes and crackles diffusely, especially in the  right middle and lower lobes. HEART:  Regular rate and rhythm.  No murmurs, gallops, or rubs. ABDOMEN:  Soft, nontender, normal bowel sounds, no distention. MUSCULOSKELETAL:  There is tenderness around the right hip joint. PULSES:  2+ DP/PT pulses bilaterally. EXTREMITIES: No clubbing, cyanosis, or edema. NEUROLOGIC:  2+ DTR reflexes bilaterally. SKIN:  No rashes.  LABS ON ADMISSION:  White blood cell count 9.8, hemoglobin 13.1, hematocrit 39.3, platelet count 371,000.  BMET:  Sodium 141, potassium 4.3, chloride 98, bicarb 33, glucose 93, BUN 2, creatinine 0.56.  INR 0.96.  Salicylate level less than 4, alcohol less than 5.  Her UDS was negative.  HOSPITAL COURSE BY PROBLEM: 1. Altered mental status secondary to opioid overuse.  Apparently, the     patient's sister-in-law contacted 911 on the day of admission     because the patient was behaving strangely over the telephone.     Report her nursing stated that the patient took 8 tablets of her MS     Contin though the patient denied this degree of excessive use after     she regained her alertness.  The patient was brought to the ED and     4 rounds of Narcan were given.  The patient had improved alertness     after each round of Narcan.  There was no concern over respiratory     distress.  As such, we did not start a Narcan drip and admitted the     patient to the step-down unit for overnight observation.  The     patient's O2 sats and vital signs remained stable throughout the     night and by hospital day #1, she had had complete resolution of     her altered mental status and was alert and oriented x3, but denied     having any memory of the prior day.  A CT scan was performed in the     ED which did not show any new acute findings.  However, it did     demonstrate an old subdural hematoma that was stable when compared     to the prior image based on records that we were able to obtained     from the hospital in [place].  As  such we did not feel that the     patient's subdural hematoma was of any significant consequence and     was unlikely to be the etiology of the patient's altered mental  status.  The patient had significant pain at the beginning of     hospital day #2 because we had been holding her morphine. As such     we restarted IV morphine for pain control.  This was accomplished     without any noted changes in the patient's mental status.  We were     certain to discuss to the patient whether or not this was a     suicidal attempt and she vehemently denied any current worsening     depression, suicidal ideation, or homicidal ideation.  This was     corroborated with the patient's family who also did not feel that     this was a suicide attempt.  The importance of the patient not     taking more than 1 MS Contin was discussed with the patient at     length as she understands the consequences of doing so. 2. Fall risk:  The patient had a significant mechanical fall in the     week prior to her admission which just occurred because she tripped     over her feet according to her.  The result of this fall was a     subdural hematoma and significant scalp laceration which is now     currently healing well.  I did provide the patient with extensive     information on the importance of avoiding such falls in the future.     We did discuss things such as removing throw rugs from her house     and getting dressed by sitting on the bed instead of trying to     dress standing up as this has caused her to fall in the past.  The     patient was encouraged to discuss this further with her primary     care physician given that the risks of subdural hematoma or hip     fracture are great in individual of her age.  The patient     understands the risks and agrees to be more careful. 3. Shortness of breath:  Based on our history, there is no known COPD     or emphysema in this patient, she also denied any current  cigarette     use.  The chest x-ray, however, did demonstrate significant signs     of COPD and emphysema.  As such, it is recommended that  DICTATION ENDS AT THIS POINT     Sinda Du, MD   ______________________________ Mariea Stable, MD    BB/MEDQ  D:  03/25/2010  T:  03/26/2010  Job:  [id number]  Electronically Signed by Sinda Du MD on 04/08/2010 10:30:04 AM Electronically Signed by Mariea Stable MD on 04/10/2010 03:26:20 PM

## 2010-04-15 NOTE — Progress Notes (Signed)
Summary: SW  Phone Note Outgoing Call   Summary of Call: Called Ms. Marina Goodell at [facility]  [phone] who confirmed that the patient does not have full Medicaid, but the Medicaid that pays for copays and coinsurance.  She is not eligible for [facility] services unfortunately.   Plan is to call patient to communicate this.       Appended Document: SW Called patient and informed her about the type of Medicaid she has.   Gave her name and phone number of her Medicaid worker should she have further questions.  Patient understood information given and thanked me for follow-up.
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
## 2010-04-15 NOTE — Progress Notes (Signed)
  Phone Note Outgoing Call   Summary of Call: Call to patient.  Inocencio Homes forwarded a phone message that she was having a problem with the [employer] and her Medicaid number.

## 2010-04-15 NOTE — Assessment & Plan Note (Signed)
Summary: REASSIGNED NEW TO DR/CFB   Vital Signs:  Patient profile:   60 year old female Height:      61 inches (154.94 cm) Weight:      93.0 pounds (42.27 kg) BMI:     17.64 Temp:     97.7 degrees F (36.50 degrees C) oral Pulse rate:   80 / minute BP sitting:   107 / 67  (right arm)  Vitals Entered By: Stanton Kidney Ditzler RN (October 16, 2009 11:22 AM) Is Patient Diabetic? No Pain Assessment Patient in pain? yes     Location: hip and back Intensity: 9-10 Type: throbbing Onset of pain  past 2-3 years Nutritional Status BMI of < 19 = underweight Nutritional Status Detail appetite fair  Have you ever been in a relationship where you felt threatened, hurt or afraid?denies   Does patient need assistance? Functional Status Self care, Cook/clean, Shopping, Social activities Ambulation Normal Comments Needs CNA and labs. CNA with pt today.   Primary Care Haydyn Girvan:  Joaquin Courts  MD   History of Present Illness: 60 yo female presents for follow up on Vit-D since her last VIt D was low.  Patient has completed a course of 8 weeks of Vit D 50,000 units.  She is also interested in getting an AID for herself at home, cannot bath herself and put on her clothes, she has fallen before trying to dress herself.  She cannot do daily activities at home.  She is followed at pain management clinic by Dr.  Fritzi Mandes. She still has back pain that is 9-10 in severity, on Morphine 30mg  three times a day. Other meds she's taking include:  Methocarbamol 500mg  three times a day cal 600 two times a day zyprexa 20 at bedtime lamotrigine 100 three times a day cymbalta 30mg  two times a day Vit D3- 1000u  Patient is depressed because her mother passed away in Sep 05, 2009.  She denies any visual hallucination but admits to hearing radio noices.  She is followed by mental health every 2 weeks.  Denies any SI/HI currently but did have SI about a year ago with plan; patient reported that she was not hospitalized  and was managed by Mental Health. She does have social support from her best friend,June and sister.    Refuse to have colonoscopy.  Had DEXA in 3/11.  Pap and mammogram 7/10.        Depression History:      The patient denies a depressed mood most of the day and a diminished interest in her usual daily activities.         Preventive Screening-Counseling & Management  Alcohol-Tobacco     Alcohol drinks/day: 0     Smoking Status: current     Smoking Cessation Counseling: yes     Smoke Cessation Stage: ready     Packs/Day: 3-4cigs/day     Year Started: AT THE AGE OF 18  Caffeine-Diet-Exercise     Does Patient Exercise: yes     Type of exercise: WALKING     Times/week: 2-3  Allergies: No Known Drug Allergies  Review of Systems  The patient denies anorexia, fever, weight loss, weight gain, vision loss, decreased hearing, hoarseness, chest pain, syncope, dyspnea on exertion, peripheral edema, prolonged cough, headaches, hemoptysis, abdominal pain, melena, hematochezia, severe indigestion/heartburn, hematuria, incontinence, genital sores, muscle weakness, suspicious skin lesions, transient blindness, difficulty walking, depression, unusual weight change, abnormal bleeding, enlarged lymph nodes, angioedema, breast masses, and testicular masses.  Physical Exam  General:  alert and well-developed. Thin  Lungs:  normal respiratory effort, no intercostal retractions, no accessory muscle use, normal breath sounds, no crackles, and no wheezes.   Heart:  normal rate, regular rhythm, no murmur, no gallop, no rub, and no JVD.   Abdomen:  soft, non-tender, normal bowel sounds, no distention, no masses, and no guarding.   Neurologic:  alert & oriented X3.      Impression & Recommendations:  Problem # 1:  DEFICIENCY, VITAMIN D NOS (ICD-268.9) Assessment Improved  Patient has been taking 8 weeks of vit D 50,000units + calcium 600 as well as getting out in the sun.  I would recheck his  vit D level again today.  If her Vit D level is still low, I would give another course of vit D 50,000 u x 8wks.    Orders: T-Vitamin D 25-Hydroxy & 1,25 Dihydroxy (0454)  Problem # 2:  OSTEOPENIA (ICD-733.90) Assessment: Unchanged  DEXA scan in 3/11 showed osteopenia.  Will start Fosamax 35mg  po qwkly and continue Ca 600 and vit D 2000 u to prevent the progression to osteoporosis especially patient is a thin white female.   Her updated medication list for this problem includes:    Alendronate Sodium 35 Mg Tabs (Alendronate sodium) .Marland Kitchen... Take 1 tablet by mouth every week 30 mins before food/drink/med and avoid lying down x 30 mins after taking med.  Problem # 3:  BACK PAIN, CHRONIC (ICD-724.5) stable.  Will continue current medication regimen that is managed by Pain Management clinic.     The following medications were removed from the medication list:    Hydrocodone-acetaminophen 10-500 Mg Tabs (Hydrocodone-acetaminophen) .Marland Kitchen... Take one tab by mouth once every 6 hours as needed for breakthrough pain    Ms Contin 30 Mg Xr12h-tab (Morphine sulfate) .Marland Kitchen... Take 1 tablet by mouth two times a day    Cyclobenzaprine Hcl 5 Mg Tabs (Cyclobenzaprine hcl) .Marland Kitchen... Take 1 tablet by mouth three times a day Her updated medication list for this problem includes:    Ms Contin 30 Mg Xr12h-tab (Morphine sulfate) .Marland Kitchen... Take 1 tablet by mouth two times a day    Methocarbamol 500 Mg Tabs (Methocarbamol) .Marland Kitchen... 1 tabletby mouth three times a day  Problem # 4:  DEPRESSION (ICD-311) Assessment: Unchanged stable. Continue current meds managed by mental health.  The following medications were removed from the medication list:    Clonazepam 1 Mg Tabs (Clonazepam) .Marland Kitchen... Take 1 tab by mouth at bedtime Her updated medication list for this problem includes:    Cymbalta 30 Mg Cpep (Duloxetine hcl) .Marland Kitchen... Take 1 tablet twice a day.  Complete Medication List: 1)  Zyprexa 20 Mg Tabs (Olanzapine) .... Take 1 tablet at bed  time. 2)  Lamictal 100 Mg Tabs (Lamotrigine) .... Take one tab in the morning and 2 tab at night 3)  Cymbalta 30 Mg Cpep (Duloxetine hcl) .... Take 1 tablet twice a day. 4)  Ms Contin 30 Mg Xr12h-tab (Morphine sulfate) .... Take 1 tablet by mouth two times a day 5)  Calcium Carbonate 600 Mg Tabs (Calcium carbonate) .... Take 1 tablet by mouth two times a day 6)  Alendronate Sodium 35 Mg Tabs (Alendronate sodium) .... Take 1 tablet by mouth every week 30 mins before food/drink/med and avoid lying down x 30 mins after taking med. 7)  Methocarbamol 500 Mg Tabs (Methocarbamol) .Marland Kitchen.. 1 tabletby mouth three times a day 8)  Vitamin D 1000 Unit Tabs (Cholecalciferol) .... 2  tablets daily  Other Orders: Social Work Referral (Social )  Patient Instructions: 1)  1. Start Fosamax 35mg  by mouth every week, 30 mins before first  food/drink/med, avoid lying down 30 mins after taking med, 2)  2. Continue taking Calcium 600 + Vit D 2000u 3)  2. Follow up in 3-4 months Prescriptions: ALENDRONATE SODIUM 35 MG TABS (ALENDRONATE SODIUM) take 1 tablet by mouth every week 30 mins before food/drink/med and avoid lying down x 30 mins after taking med.  #4 x 12   Entered and Authorized by:   Rosana Berger MD   Signed by:   Zoila Shutter MD on 10/16/2009   Method used:   Electronically to        [facility] [address] [phone]* (retail)       [address]       [place], [place]  [address]       Ph: [phone] or [phone]       Fax: [phone]   RxID:   [id number]  Process Orders Check Orders Results:     [facility]: Check successful Tests Sent for requisitioning (October 19, 2009 7:00 PM):     10/16/2009: [facility] -- T-Vitamin D 25-Hydroxy & 1,25 Dihydroxy [[id number]] (signed)     Prevention & Chronic Care Immunizations   Influenza vaccine: Fluvax 3+  (01/09/2009)    Tetanus booster: Not documented    Pneumococcal vaccine: Not  documented  Colorectal Screening   Hemoccult: Not documented   Hemoccult action/deferral: Ordered  (09/29/2008)    Colonoscopy: Not documented   Colonoscopy action/deferral: Refused  (01/09/2009)  Other Screening   Pap smear: NEGATIVE FOR INTRAEPITHELIAL LESIONS OR MALIGNANCY.  (03/28/2008)   Pap smear action/deferral: Deferred-2 yr interval  (06/02/2009)   Pap smear due: 03/29/2011    Mammogram: No specific mammographic evidence of malignancy.  Assessment: BIRADS 1. Bilateral implants.   (10/06/2008)   Mammogram action/deferral: Ordered  (09/29/2008)   Smoking status: current  (10/16/2009)   Smoking cessation counseling: yes  (10/16/2009)  Lipids   Total Cholesterol: 176  (06/02/2009)   Lipid panel action/deferral: Lipid Panel ordered   LDL: 86  (06/02/2009)   LDL Direct: Not documented   HDL: 73  (06/02/2009)   Triglycerides: 87  (06/02/2009)      Resource handout printed.

## 2010-04-15 NOTE — Miscellaneous (Signed)
Summary: [facility]  [place] [facility]   Imported By: Shon Hough 10/16/2009 16:23:23  _____________________________________________________________________  External Attachment:    Type:   Image     Comment:   External Document
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
## 2010-04-15 NOTE — Miscellaneous (Signed)
Summary: Orders Update  Clinical Lists Changes  Orders: Added new Referral order of Social Work Referral (Social ) - Signed 

## 2010-04-15 NOTE — Assessment & Plan Note (Signed)
Summary: HFU/SB.   Vital Signs:  Patient profile:   60 year old female Height:      61 inches Weight:      93.5 pounds BMI:     17.73 Temp:     97.2 degrees F rectal Pulse rate:   76 / minute BP sitting:   108 / 63  (right arm)  Vitals Entered By: Filomena Jungling NT II (April 02, 2010 9:37 AM) CC: HFU, Depression   ? VITAMIN D Is Patient Diabetic? No Pain Assessment Patient in pain? no      Nutritional Status BMI of < 19 = underweight  Have you ever been in a relationship where you felt threatened, hurt or afraid?No   Does patient need assistance? Functional Status Self care Ambulation Normal   Primary Care Provider:  Rosana Berger MD  CC:  HFU and Depression   ? VITAMIN D.  History of Present Illness: [name] is a 60 year-old lady with a history of bipolar disorder. chronic pain in shoulder, back and hips and OCD who presented for hosptial follow up. She was admitted on 1/10 for AMS in setting of excessive MS contin intake. She was fine after narcan adminitration. She did have a subdural hematoma on the CT scan which occured possibly from a fall she had 1 week prior to the admissin although this seemed to be getting better and no further workup was required.   she denies headache, weakness, episodes of passing out or other compaints.  she lives by herself in [facility] which is a semi returement facility where she gets lunch everyday and they have aid if she needs it but she cooks her own food rest of the day.  she has been taking 1 tab morphine ER three times a day as prescribed.  she was going to Dr. Wynn Banker but she says they dropped her because she tried to commit suicide  she asks if she can take a shower with her wound on the head from fall 2 weeks ago  Depression History:      The patient denies a depressed mood most of the day and a diminished interest in her usual daily activities.         Preventive Screening-Counseling &  Management  Alcohol-Tobacco     Smoking Status: current     Year Started: AGE 60     Cigars/week: 2-3 A DAY  Caffeine-Diet-Exercise     Does Patient Exercise: yes     Type of exercise: WALKING     Times/week: 2-3  Current Medications (verified): 1)  Zyprexa 20 Mg Tabs (Olanzapine) .... Take 1 Tablet At Bed Time. 2)  Lamictal 100 Mg  Tabs (Lamotrigine) .... Take One Tab in The Morning and 2 Tab At Night 3)  Cymbalta 30 Mg Cpep (Duloxetine Hcl) .... Take 1 Tablet Twice A Day. 4)  Ms Contin 30 Mg Xr12h-Tab (Morphine Sulfate) .... Take 1 Tablet By Mouth Two Times A Day 5)  Calcium Carbonate 600 Mg Tabs (Calcium Carbonate) .... Take 1 Tablet By Mouth Two Times A Day 6)  Alendronate Sodium 35 Mg Tabs (Alendronate Sodium) .... Take 1 Tablet By Mouth Every Week 30 Mins Before Food/drink/med and Avoid Lying Down X 30 Mins After Taking Med. 7)  Methocarbamol 500 Mg Tabs (Methocarbamol) .Marland Kitchen.. 1 Tabletby Mouth Three Times A Day 8)  Vitamin D 1000 Unit Tabs (Cholecalciferol) .... 2 Tablets Daily 9)  Ventolin Hfa 108 (90 Base) Mcg/act Aers (Albuterol Sulfate) .... Take  1 Puff Every 6 Hours As Needed For Wheezing. 10)  Zolpidem Tartrate 5 Mg Tabs (Zolpidem Tartrate) .Marland Kitchen.. 1 and A Half Tablet At Night As Needed For Sleep 11)  Clonazepam 1 Mg Tabs (Clonazepam) .Marland Kitchen.. 1 Tablet At Bedtime  Allergies (verified): No Known Drug Allergies  Review of Systems  The patient denies anorexia, fever, weight loss, weight gain, vision loss, decreased hearing, hoarseness, chest pain, syncope, dyspnea on exertion, peripheral edema, prolonged cough, headaches, hemoptysis, abdominal pain, melena, hematochezia, severe indigestion/heartburn, hematuria, incontinence, genital sores, muscle weakness, suspicious skin lesions, transient blindness, difficulty walking, depression, unusual weight change, abnormal bleeding, enlarged lymph nodes, angioedema, breast masses, and testicular masses.    Physical Exam  General:  Gen: VS  reveiwed, Alert, well developed, nodistress ENT: mucous membranes pink & moist. No abnormal finds in ear and nose. CVC:S1 S2 , no murmurs, no abnormal heart sounds. Lungs: Clear to auscultation B/L. No wheezes, crackles or other abnormal sounds Abdomen: soft, non distended, no tender. Normal Bowel sounds EXT: no pitting edema, no engorged veins, Pulsations normal  Neuro:alert, oriented *3, cranial nerved 2-12 intact, strenght normal in all  extremities, senstations normal to light touch.  head- wound on the head, right temple cleaned, well healed, some remianing scabs     Impression & Recommendations:  Problem # 1:  NARCOTIC ABUSE (ICD-305.90) patient says Dr. Wynn Banker will not see her anymore, but it seems like she has an appointment set up in Feb called  his office and confirmed that they are not going to see her anymore.  Will try to get her in touch with another pain clinic. will prescribe ms contin from opc if that does not work out Will not prescibe any naroctics from here today she is taking 1 tablet of ms contin three times a day as prescribed (counted the tablets in the pill bottle.)  Problem # 2:  ALTERED MENTAL STATUS (ICD-780.97) from narcotic ovedose admitted 03/23/09 patient seems ok now. she is oriented to tpp, neurological exma normal continue current pain and psychiatric medication regime per pain clinic and [facility]  Problem # 3:  HEMATOMA, SUBDURAL (ICD-432.1) no headache, neurological abnormality continue to monitor  Complete Medication List: 1)  Zyprexa 20 Mg Tabs (Olanzapine) .... Take 1 tablet at bed time. 2)  Lamictal 100 Mg Tabs (Lamotrigine) .... Take one tab in the morning and 2 tab at night 3)  Cymbalta 30 Mg Cpep (Duloxetine hcl) .... Take 1 tablet twice a day. 4)  Ms Contin 30 Mg Xr12h-tab (Morphine sulfate) .... Take 1 tablet by mouth two times a day 5)  Calcium Carbonate 600 Mg Tabs (Calcium carbonate) .... Take 1 tablet by mouth two  times a day 6)  Alendronate Sodium 35 Mg Tabs (Alendronate sodium) .... Take 1 tablet by mouth every week 30 mins before food/drink/med and avoid lying down x 30 mins after taking med. 7)  Methocarbamol 500 Mg Tabs (Methocarbamol) .Marland Kitchen.. 1 tabletby mouth three times a day 8)  Vitamin D 1000 Unit Tabs (Cholecalciferol) .... 2 tablets daily 9)  Ventolin Hfa 108 (90 Base) Mcg/act Aers (Albuterol sulfate) .... Take 1 puff every 6 hours as needed for wheezing. 10)  Zolpidem Tartrate 5 Mg Tabs (Zolpidem tartrate) .Marland Kitchen.. 1 and a half tablet at night as needed for sleep 11)  Clonazepam 1 Mg Tabs (Clonazepam) .Marland Kitchen.. 1 tablet at bedtime  Patient Instructions: 1)  Please schedule a follow-up appointment in 1 month.   Orders Added: 1)  Est. Patient Level  IV [id number]      Prevention & Chronic Care Immunizations   Influenza vaccine: Fluvax 3+  (01/09/2009)    Tetanus booster: Not documented    Pneumococcal vaccine: Not documented  Colorectal Screening   Hemoccult: Not documented   Hemoccult action/deferral: Ordered  (09/29/2008)    Colonoscopy: Not documented   Colonoscopy action/deferral: Refused  (01/09/2009)  Other Screening   Pap smear: NEGATIVE FOR INTRAEPITHELIAL LESIONS OR MALIGNANCY.  (03/28/2008)   Pap smear action/deferral: Deferred-2 yr interval  (06/02/2009)   Pap smear due: 03/29/2011    Mammogram: No specific mammographic evidence of malignancy.  Assessment: BIRADS 1. Bilateral implants.   (10/06/2008)   Mammogram action/deferral: Ordered  (09/29/2008)   Smoking status: current  (04/02/2010)   Smoking cessation counseling: yes  (10/16/2009)  Lipids   Total Cholesterol: 176  (06/02/2009)   Lipid panel action/deferral: Lipid Panel ordered   LDL: 86  (06/02/2009)   LDL Direct: Not documented   HDL: 73  (06/02/2009)   Triglycerides: 87  (06/02/2009)

## 2010-04-15 NOTE — Letter (Signed)
Summary: [facility]  [facility] [place]   Imported By: Margie Billet 06/11/2009 14:31:11  _____________________________________________________________________  External Attachment:    Type:   Image     Comment:   External Document  Appended Document: [facility] [place]    Clinical Lists Changes  Problems: Added new problem of OSTEOPOROSIS (ICD-733.00) - Signed       Impression & Recommendations:  Problem # 1:  OSTEOPOROSIS (ICD-733.00) Will need to wait until vit D is replete before starting bisphosphonate.  Will recheck Vit D level in 2-3 mos and make sure she is taking the repleting dose w/ daily calcium.   Complete Medication List: 1)  Clonazepam 1 Mg Tabs (Clonazepam) .... Take 1 tab by mouth at bedtime 2)  Hydrocodone-acetaminophen 10-500 Mg Tabs (Hydrocodone-acetaminophen) .... Take one tab by mouth once every 6 hours as needed for breakthrough pain 3)  Zyprexa 20 Mg Tabs (Olanzapine) .... Take 1 tablet at bed time. 4)  Lamictal 100 Mg Tabs (Lamotrigine) .... Take one tab in the morning and 2 tab at night 5)  Ms Contin 30 Mg Xr12h-tab (Morphine sulfate) .... Take 1 tablet by mouth two times a day 6)  Cymbalta 30 Mg Cpep (Duloxetine hcl) .... Take 1 tablet twice a day. 7)  Flonase 50 Mcg/act Susp (Fluticasone propionate) .... Instill 1 spray per nostril daily. 8)  Guaifenesin 200 Mg Tabs (Guaifenesin) .... Take 1 tablet by mouth twice a day. 9)  Cyclobenzaprine Hcl 5 Mg Tabs (Cyclobenzaprine hcl) .... Take 1 tablet by mouth three times a day 10)  Ms Contin 30 Mg Xr12h-tab (Morphine sulfate) .... Take 1 tablet by mouth two times a day 11)  Vitamin D (ergocalciferol) 50000 Unit Caps (Ergocalciferol) .... Take 1 tab by mouth once a week for 8 weeks. 12)  Calcium Carbonate 600 Mg Tabs (Calcium carbonate) .... Take 1 tablet by mouth two times a day  Appended Document: [facility] [place] Left message  on home phone ID recording - Dr Andrey Campanile suggest to cont with Vit D and to rept Vit D bloodwork 2-3 months and continue with calium suppliment.

## 2010-04-15 NOTE — Assessment & Plan Note (Signed)
Summary: Soc. Work  Met with patient and aide ([facility]).  I find a very thin frail looking Caucasian woman who expresses need for home aide services for bathing, dressing, home chores, and meal prep.  The patient has both Medicaid and Medicare.  The patient reports sciatica/back pain as main reason for need for services.  She is also coping with bipolar disorder.   Discussed with Dr. Anselm Jungling who signed off on request for in-home services.    Appended Document: Soc. Work Faxed request to [employer] today.

## 2010-04-30 ENCOUNTER — Encounter: Payer: Self-pay | Admitting: Internal Medicine

## 2010-04-30 ENCOUNTER — Ambulatory Visit (INDEPENDENT_AMBULATORY_CARE_PROVIDER_SITE_OTHER): Payer: Medicare Other | Admitting: Internal Medicine

## 2010-04-30 VITALS — BP 107/66 | HR 89 | Temp 98.9°F | Wt 96.8 lb

## 2010-04-30 DIAGNOSIS — F1910 Other psychoactive substance abuse, uncomplicated: Secondary | ICD-10-CM

## 2010-04-30 DIAGNOSIS — F329 Major depressive disorder, single episode, unspecified: Secondary | ICD-10-CM

## 2010-04-30 DIAGNOSIS — J329 Chronic sinusitis, unspecified: Secondary | ICD-10-CM | POA: Insufficient documentation

## 2010-04-30 MED ORDER — TRAMADOL HCL 50 MG PO TABS: 50.0000 mg | ORAL_TABLET | Freq: Three times a day (TID) | ORAL | Status: DC | PRN

## 2010-04-30 MED ORDER — AZITHROMYCIN 1 G PO PACK: 1.0000 | PACK | Freq: Once | ORAL | Status: AC

## 2010-04-30 NOTE — Assessment & Plan Note (Signed)
Patient has history of narcotic abuse and per Dr. Trecia Rogers report she has tried to overdose on morphine. She was dismissed to a pain clinic and was told she cannot get refill on her pain medications. I have discussed this with patient and explained that these violations I taken seriously and for that reason we cannot continue to prescribe any narcotics. I have also discussed this case with Dr. Coralee Pesa and we have agreed that we can offer patient tramadol for pain control. Patient agreed to try tramadol. I have also discussed potential drug drug interaction with Cymbalta and explained to the patient if she experiences any of those symptoms to call us back immediately or to go to emergency department.

## 2010-04-30 NOTE — Progress Notes (Signed)
Subjective:    Patient ID: [name], female    DOB: 1951/02/03, 60 y.o.   MRN: [id number]  HPI  patient is 60 year old female who presents to clinic with main concern pain in sinuses. She reports problems started approximately one week ago and has been getting progressively worse. She reports associated fevers and chills, pain in the sinus area. She has history of similar symptoms in the past when she gets treated with antibiotics her symptoms resolve. She denies other systemic symptoms of night sweats, appetite change, weight loss. In addition she denies any abdominal or urinary concerns, no blood in urine or stool, no episodes of chest pain or cough. Her other problem is chronic back pain which radiates down to her legs, 8/10 in severity, sharp to dull, alleviated with morphine. She was receiving morphine from pain clinic by Dr. Baxter Hire however, she was dismissed from pain clinic and they told her it was because she tried to overdose on morphine. Patient denies trying to overdose, she reports she was trying to control her pain and by mistake took higher doses of pain medication and recommended. She denies any depressive symptoms, has chronic difficulty with sleeping but takes sleeping medications which do help. She also reports that her depression medications which are prescribed by mental health department (Dr. Rosalia Hammers) help. She denies any suicidal or homicidal ideations at the moment. Her recent hospitalization was approximately 3 weeks ago when she was hospitalized for presumptive overdose on pain medication.   Review of Systems  Constitutional: Positive for fever and chills. Negative for activity change, appetite change, fatigue and unexpected weight change.  HENT: Positive for congestion, rhinorrhea and postnasal drip. Negative for hearing loss, ear pain, facial swelling, neck pain, neck stiffness and ear discharge.   Eyes: Negative.   Respiratory: Negative.   Cardiovascular: Negative.     Gastrointestinal: Negative.   Genitourinary: Negative.   Musculoskeletal: Positive for back pain.  Neurological: Negative.   Psychiatric/Behavioral: Negative.        Objective:   Physical Exam  Constitutional: She appears well-developed and well-nourished. No distress.  HENT:  Head: Normocephalic and atraumatic. No trismus in the jaw.  Right Ear: External ear normal.  Left Ear: External ear normal.  Nose: Mucosal edema, rhinorrhea and sinus tenderness present. No nose lacerations, nasal deformity, septal deviation or nasal septal hematoma. No epistaxis.  No foreign bodies. Right sinus exhibits maxillary sinus tenderness and frontal sinus tenderness. Left sinus exhibits maxillary sinus tenderness and frontal sinus tenderness.  Mouth/Throat: Oropharynx is clear and moist and mucous membranes are normal. She does not have dentures. No oral lesions. Abnormal dentition. Dental caries present. No dental abscesses, uvula swelling or lacerations. No oropharyngeal exudate.  Eyes: Conjunctivae and EOM are normal. Pupils are equal, round, and reactive to light. Right eye exhibits no discharge. Left eye exhibits no discharge. No scleral icterus.  Neck: Normal range of motion. Neck supple. No JVD present. No tracheal deviation present.  Cardiovascular: Normal rate, regular rhythm and intact distal pulses.  Exam reveals no gallop and no friction rub.   No murmur heard. Pulmonary/Chest: Effort normal and breath sounds normal. No respiratory distress. She has no wheezes. She has no rales. She exhibits no tenderness.  Abdominal: Soft. Bowel sounds are normal. She exhibits no distension and no mass. There is no tenderness. There is no rebound and no guarding.  Lymphadenopathy:    She has no cervical adenopathy.  Skin: Skin is warm and dry. No rash noted. She is not diaphoretic.  No erythema. No pallor.  Psychiatric: She has a normal mood and affect. Her behavior is normal. Judgment and thought content  normal.          Assessment & Plan:

## 2010-04-30 NOTE — Assessment & Plan Note (Signed)
Patient's symptoms and physical exam findings are consistent with sinusitis infection. We will treat her with a course of azithromycin antibiotic. Patient was advised if her symptoms do not improve and she experiences worsening fevers and chills she needs to call us back or go to the emergency department.

## 2010-04-30 NOTE — Assessment & Plan Note (Signed)
Depression appears to be well controlled at the moment. Patient will continue to follow with mental health department where she gets her medications from. There is a concern the patient is taking medications more than recommended. Patient has follow up appointment with the mental health department.  Please send copy of the note to mental health department expressing our concerns and we'll follow up on their recommendations.

## 2010-04-30 NOTE — Patient Instructions (Signed)
Schedule follow up appointment in 6 months

## 2010-05-06 ENCOUNTER — Other Ambulatory Visit: Payer: Self-pay | Admitting: *Deleted

## 2010-05-06 MED ORDER — AZITHROMYCIN 1 G PO PACK: 1.0000 | PACK | Freq: Once | ORAL | Status: AC

## 2010-05-06 NOTE — Telephone Encounter (Signed)
Pt states she needs another zpack, that it usually takes 2 rounds to rid her of a sinus infection, she is now requesting that #2.

## 2010-05-14 ENCOUNTER — Telehealth: Payer: Self-pay | Admitting: *Deleted

## 2010-05-14 NOTE — Telephone Encounter (Signed)
Pt calls and states the tramadol is not helping her sciatica, she states in the past she had been on something that was effective, she also requests an appt asap, transferred to appts. For scheduling

## 2010-05-18 NOTE — Telephone Encounter (Signed)
Patient needs to be evaluated in office.  I have not seen this patient in the past

## 2010-05-25 ENCOUNTER — Encounter: Payer: Self-pay | Admitting: Internal Medicine

## 2010-05-25 ENCOUNTER — Ambulatory Visit (INDEPENDENT_AMBULATORY_CARE_PROVIDER_SITE_OTHER): Payer: Medicare Other | Admitting: Internal Medicine

## 2010-05-25 VITALS — BP 113/68 | HR 86 | Temp 97.3°F | Ht 61.0 in | Wt 97.3 lb

## 2010-05-25 DIAGNOSIS — M545 Low back pain: Secondary | ICD-10-CM

## 2010-05-25 DIAGNOSIS — J329 Chronic sinusitis, unspecified: Secondary | ICD-10-CM

## 2010-05-25 MED ORDER — IPRATROPIUM BROMIDE 0.03 % NA SOLN: 2.0000 | Freq: Three times a day (TID) | NASAL | Status: DC

## 2010-05-25 MED ORDER — GABAPENTIN 400 MG PO CAPS: 400.0000 mg | ORAL_CAPSULE | Freq: Three times a day (TID) | ORAL | Status: DC

## 2010-05-25 NOTE — Assessment & Plan Note (Signed)
Symptoms consistent with post nasal drip/viral.  She does have mild tenderness on maxillary sinuses.  She stated that she completed the Azithromax prescribed by Dr. Aldine Contes already.   -Will prescribe Atrovent nasal spray- 2 sprays in each nostril 3 times per day -I advised patient that if she continues to cough and have purulent sputum with fever and chills, she needs to come back for evaluation or go to ED.

## 2010-05-25 NOTE — Assessment & Plan Note (Signed)
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 Patient has a history of narcotic abuse and was recently admitted for MS contin overdose in January 2012.  She was also dismissed from Dr. Fritzi Mandes pain clinic. She states that Tramadol and NSAIDs do not work for her and that we have tried some medication in the past that actually helped with her hip/back pain either Neurontin or Flexeril; therefore, will try Neurontin x 3 weeks and if no improvement, will switch to muscle relaxant.   We discussed all the treatment options including: steroid injections, NSAIDs, surgery, muscle relaxants, and opioids.  She said that she tried all already and does not want surgery.  Also discussed the risk of overdose again on narcotics -Will refer to another pain clinic -Will start Neurontin 400mg  taper-up dose to three times daily and advised to decrease dose if she starts feeling nauseous

## 2010-05-25 NOTE — Progress Notes (Signed)
  Subjective:    Patient ID: [name], female    DOB: 1950-09-16, 60 y.o.   MRN: [id number]  HPI Comments: She also complains of cough with clear phlegm x 1 month, post nasal drip symptoms.  Denies any fever, N/V, chestpain or SOB.  She feels that her sinus infection has improved  She states that Tramadol is not helping with her pain and she wants to be referred to a new pain clinic since she was dismissed from Dr. Trecia Rogers clinic.  Back Pain This is a chronic problem. The current episode started more than 1 year ago. The problem occurs constantly. The problem is unchanged. The pain is present in the lumbar spine. The quality of the pain is described as aching. The pain radiates to the right thigh and left thigh. The pain is at a severity of 8/10. The pain is severe. The pain is the same all the time. The symptoms are aggravated by bending and position. Associated symptoms include leg pain. Pertinent negatives include no fever. She has tried analgesics and NSAIDs for the symptoms. The treatment provided no relief.  Hip Pain  The incident occurred more than 1 week ago. There was no injury mechanism. The pain is present in the right hip and left hip. The quality of the pain is described as aching and shooting. The pain is at a severity of 8/10. The pain is severe. The pain has been constant since onset. She reports no foreign bodies present. The symptoms are aggravated by movement. She has tried acetaminophen and NSAIDs for the symptoms. The treatment provided no relief.      Review of Systems  Constitutional: Positive for chills. Negative for fever, diaphoresis and fatigue.  HENT: Positive for congestion, rhinorrhea, postnasal drip and sinus pressure. Negative for hearing loss and ear pain.   Respiratory: Positive for cough. Negative for apnea, choking, chest tightness, shortness of breath, wheezing and stridor.   Cardiovascular: Negative.   Gastrointestinal: Negative.   Genitourinary:  Negative.   Musculoskeletal: Positive for back pain.  Skin: Negative.   Neurological: Negative.   Psychiatric/Behavioral: Negative.   [all other systems reviewed and are negative       Objective:   Physical Exam  Constitutional: She is oriented to person, place, and time.       Thin, frail looking woman  HENT:  Right Ear: External ear normal.  Left Ear: External ear normal.  Nose: Nose normal.  Mouth/Throat: Oropharynx is clear and moist.  Eyes: Pupils are equal, round, and reactive to light. Left eye exhibits no discharge.  Neck: Normal range of motion. Neck supple.  Cardiovascular: Normal rate, regular rhythm and intact distal pulses.  Exam reveals no gallop and no friction rub.   No murmur heard. Pulmonary/Chest: Effort normal and breath sounds normal. No respiratory distress. She has no wheezes. She has no rales. She exhibits no tenderness.  Abdominal: Soft. She exhibits no distension and no mass. There is no tenderness. There is no rebound and no guarding.  Musculoskeletal: She exhibits tenderness.       Positive straight leg raising bilaterally  Neurological: She is alert and oriented to person, place, and time. No cranial nerve deficit.  Psychiatric: She has a normal mood and affect. Her behavior is normal. Judgment and thought content normal.          Assessment & Plan:

## 2010-05-25 NOTE — Patient Instructions (Signed)
You will need to take Neurontin 400mg  1 tablet x 1 day, then 2 tablets x 1 day, then 3 tablets daily, if you start feeling nauseous, you can decrease the dose if Neurontin does not help improve your pain, we will try a muscle relaxant next office visit Please use Atrovent 2 nasal spraysin each nostril three times daily for your post nasal drip If you continue to cough and have fever or chills, you will need to come back to the office or go to the ED for evaluation. We will refer you to another pain clinic Please follow up with Dr. Anselm Jungling in 2-3 weeks if no improvement

## 2010-06-07 ENCOUNTER — Telehealth: Payer: Self-pay | Admitting: *Deleted

## 2010-06-07 NOTE — Telephone Encounter (Signed)
Pt called to inform you that the gabapentin 400 mg is not helping her pain.  She would like something stronger.  Pt # [id number]

## 2010-06-08 NOTE — Telephone Encounter (Signed)
No change in meds between visits.  Make her an app't with Dr. Anselm Jungling.

## 2010-06-08 NOTE — Telephone Encounter (Signed)
Pt informed and scheduling will set up appointment

## 2010-06-22 ENCOUNTER — Ambulatory Visit (INDEPENDENT_AMBULATORY_CARE_PROVIDER_SITE_OTHER): Payer: Medicare Other | Admitting: Internal Medicine

## 2010-06-22 DIAGNOSIS — F1910 Other psychoactive substance abuse, uncomplicated: Secondary | ICD-10-CM

## 2010-06-22 DIAGNOSIS — J309 Allergic rhinitis, unspecified: Secondary | ICD-10-CM | POA: Insufficient documentation

## 2010-06-22 DIAGNOSIS — J329 Chronic sinusitis, unspecified: Secondary | ICD-10-CM

## 2010-06-22 DIAGNOSIS — J45909 Unspecified asthma, uncomplicated: Secondary | ICD-10-CM

## 2010-06-22 MED ORDER — FEXOFENADINE-PSEUDOEPHED ER 60-120 MG PO TB12: 1.0000 | ORAL_TABLET | Freq: Two times a day (BID) | ORAL | Status: DC

## 2010-06-22 MED ORDER — FLUTICASONE PROPIONATE 50 MCG/ACT NA SUSP: 2.0000 | Freq: Every day | NASAL | Status: DC

## 2010-06-22 MED ORDER — GUAIFENESIN ER 600 MG PO TB12: 600.0000 mg | ORAL_TABLET | Freq: Two times a day (BID) | ORAL | Status: DC

## 2010-06-22 MED ORDER — SALINE NASAL SPRAY 0.65 % NA SOLN: 1.0000 | NASAL | Status: DC | PRN

## 2010-06-22 NOTE — Assessment & Plan Note (Signed)
Red boggy mucosa, will start her on flonase for better control

## 2010-06-22 NOTE — Assessment & Plan Note (Signed)
Allergic bronchitis with wheezing likely secondary to heavy pollen exposure. I will continue atrovent nasal spray and add albuterol prn bases.

## 2010-06-22 NOTE — Patient Instructions (Signed)
Wear mask to limit pollen exposure Use air purifier for few days if possible. Take new medications as prescribed. Return in one month.

## 2010-06-22 NOTE — Assessment & Plan Note (Signed)
Patient now seen by other pain physician, not taking tramadol or gabapentin. Now on hydrocodone.

## 2010-06-22 NOTE — Assessment & Plan Note (Signed)
Has taken two z packs without releif. Her sinusitis does not look infective right now. It seems allergic. Treat with saline, docongestant with antihistamine and nasal steroids.

## 2010-06-22 NOTE — Progress Notes (Signed)
  Subjective:    Patient ID: [name], female    DOB: 1950/03/28, 60 y.o.   MRN: [id number]  HPI 60 year old female with past medical history of bipolar disorder, narcotic abuse, recent bouts of allergic rhinitis with sinusitis, who presents with complaints of unresolved sinusitis and allergic bronchitis. She reports that even after taking over-the-counter decongestant as well as allergy medicine she is no better. She's also taking Atrovent as prescribed. She continues to have some wheezing, shortness of breath when she walks outside. She also reports Henrene Dodge is a congestion, cough, inability to sleep during the night due to cough.  She's no longer taking Tylenol as well as gabapentin. Her pain is addition has now started her on 10 mg of hydrocodone.   Review of Systems  All other systems reviewed and are negative.       Objective:   Physical Exam BP 110/64  Pulse 77  Temp(Src) 98.2 F (36.8 C) (Oral)  Ht 6\' 1"  (1.854 m)  Wt 100 lb 3.2 oz (45.45 kg)  BMI 13.22 kg/m2  General Appearance:    Alert, cooperative, no distress, appears stated age  Head:    Normocephalic, without obvious abnormality, atraumatic  Eyes:    PERRL, conjunctiva/corneas clear, EOM's intact, fundi    benign, both eyes  Ears:    Normal TM's and external ear canals, both ears  Nose: Swollen red nasal mucosa, post nasal drip, sinus tenderness  Throat:   Lips, mucosa, and tongue normal; teeth and gums normal  Neck:   Supple, symmetrical, trachea midline, no adenopathy;    thyroid:  no enlargement/tenderness/nodules; no carotid   bruit or JVD  Back:     Symmetric, no curvature, ROM normal, no CVA tenderness  Lungs:     corase rhonchi, wheezing mild, good bilateral air entry  Chest Wall:    No tenderness or deformity   Heart:    Regular rate and rhythm, S1 and S2 normal, no murmur, rub   or gallop  Abdomen:     Soft, non-tender, bowel sounds active all four quadrants,    no masses, no organomegaly    Extremities:   Extremities normal, atraumatic, no cyanosis or edema  Pulses:   2+ and symmetric all extremities  Skin:   Skin color, texture, turgor normal, no rashes or lesions  Lymph nodes:   Cervical, supraclavicular, and axillary nodes normal  Neurologic:   CNII-XII intact, normal strength, sensation and reflexes    throughout          Assessment & Plan:

## 2010-07-09 ENCOUNTER — Ambulatory Visit
Admission: RE | Admit: 2010-07-09 | Discharge: 2010-07-09 | Disposition: A | Discharge status: Home / Self Care | Payer: Medicare Other | Source: Ambulatory Visit | Attending: Pain Medicine | Admitting: Pain Medicine

## 2010-07-09 ENCOUNTER — Other Ambulatory Visit: Payer: Self-pay | Admitting: Pain Medicine

## 2010-07-09 DIAGNOSIS — W19XXXA Unspecified fall, initial encounter: Secondary | ICD-10-CM

## 2010-07-12 ENCOUNTER — Telehealth: Payer: Self-pay | Admitting: Licensed Clinical Social Worker

## 2010-07-12 NOTE — Telephone Encounter (Signed)
[name] [id number]   Out of office.    Ms. [name]  Supervisor   [phone]  Left message regarding active Medicaid.  Paperwork came in regarding home aide.  No indication that patient discussed with MD.  Unclear if she has full Medicaid.

## 2010-07-27 ENCOUNTER — Ambulatory Visit (INDEPENDENT_AMBULATORY_CARE_PROVIDER_SITE_OTHER): Payer: Medicare Other | Admitting: Internal Medicine

## 2010-07-27 ENCOUNTER — Encounter: Payer: Self-pay | Admitting: Internal Medicine

## 2010-07-27 ENCOUNTER — Encounter: Payer: Medicare Other | Admitting: Internal Medicine

## 2010-07-27 DIAGNOSIS — Z8659 Personal history of other mental and behavioral disorders: Secondary | ICD-10-CM

## 2010-07-27 DIAGNOSIS — Z1211 Encounter for screening for malignant neoplasm of colon: Secondary | ICD-10-CM

## 2010-07-27 DIAGNOSIS — F1910 Other psychoactive substance abuse, uncomplicated: Secondary | ICD-10-CM

## 2010-07-27 DIAGNOSIS — Z1239 Encounter for other screening for malignant neoplasm of breast: Secondary | ICD-10-CM

## 2010-07-27 DIAGNOSIS — J309 Allergic rhinitis, unspecified: Secondary | ICD-10-CM

## 2010-07-27 NOTE — Assessment & Plan Note (Addendum)
Stable.  Denies any SI/HI/auditory or visual hallucinations.  Continue current medication.

## 2010-07-27 NOTE — Progress Notes (Signed)
  Subjective:    Patient ID: [name], female    DOB: 12/11/1950, 60 y.o.   MRN: [id number]   HPI 60 yo woman with PMH of osteoporosis, depression, bipolar disorder, allergic rhinitis presents for follow up.  She reports feeling much better after using the flonase and saline rinse and allergra + mucinex.  She only uses allegra once daily instead of twice daily as prescribed.  She still has post nasal drip and an occasional cough.  Patient denies any fever, productive phlegm, chills, n/v, chest pain, or sob.  She also reports cutting down on her cigarette use from 2 ppd to 2-3 cigarettes per day that she is planning to stop smoking because she knows it aggravates her allergy.  No other complaints at this time.    Review of Systems Per HPI     Objective:   Physical Exam  Constitutional: She is oriented to person, place, and time.  HENT:  Head: Normocephalic and atraumatic.  Right Ear: External ear normal.  Left Ear: External ear normal.  Mouth/Throat: Oropharynx is clear and moist. No oropharyngeal exudate.       Nasal mucosa: erythematous and mild swelling, no drainage  Eyes: Conjunctivae are normal. Pupils are equal, round, and reactive to light.  Neck: Normal range of motion. Neck supple. No JVD present. No tracheal deviation present. No thyromegaly present.  Cardiovascular: Normal rate and regular rhythm.  Exam reveals no gallop and no friction rub.   No murmur heard.      Distant heart sounds  Pulmonary/Chest: Effort normal and breath sounds normal. No stridor. No respiratory distress. She has no wheezes. She has no rales. She exhibits no tenderness.  Abdominal: Soft. Bowel sounds are normal. She exhibits no distension. There is no tenderness. There is no rebound and no guarding.  Musculoskeletal: Normal range of motion. She exhibits no edema and no tenderness.  Neurological: She is alert and oriented to person, place, and time. She has normal reflexes.  Skin: No rash noted. No  erythema. No pallor.  Psychiatric: She has a normal mood and affect. Her behavior is normal. Judgment and thought content normal.          Assessment & Plan:

## 2010-07-27 NOTE — Assessment & Plan Note (Signed)
Stable, no complaints at this time.  Pain medication is being prescribed by pain clinic.  She states that sometimes she loose her balance likely from narcotics; therefore, her pain doctor prescribed her a walker to prevent falls.

## 2010-07-27 NOTE — Assessment & Plan Note (Signed)
Significant improvement.  Will continue current regimen including Flonase, saline rinse, allegra, and mucinex.  Counseled patient on smoking cessation and avoid known irritants that cause her allergy.

## 2010-07-27 NOTE — Patient Instructions (Signed)
Will schedule for colonoscopy and mammogram. Follow up with Dr. Anselm Jungling in 3-4 months. Continue current medications for allergy

## 2010-07-30 NOTE — Group Therapy Note (Signed)
[name] is a 60 year old female, referred for evaluation of  back and hip pain.  The patient is looking for nonnarcotic treatment  options.  She states her pain has been ongoing for several years.  She  has had lumbar x-rays dating back to 2007, that showing some  degenerative disk disease at L4-5 as evidenced by decreased disk space.  She had bilateral facet arthropathy L4-5, L5-S1 as well.  She has had  hip films, negative hip in 2007, mild degenerative changes in right SI  joint in 2007.  She underwent MRI scanning on March 02, 2007, showing  L4-5 bilateral facet overgrowth, mild stenosis centrally.  She was  evaluated by Orthopedics, both Dr. Penni Mcclure and then Dr. Leotis Mcclure.  She  has been on MS Contin 30 mg q.12 h. and hydrocodone 10/325 q.6 in  addition to Cymbalta 20 mg 2 a day and clonazepam 1 mg b.i.d., ibuprofen  400 per day, Zyprexa 10 mg 3 tablets at night, Lamictal 1 tablet in the  morning, 2 tablets at night.   Average pain is 8/10.  Pain interferes with activity at 6/10.  Sleep is  poor.  Pain is worse with walking, sitting, inactivity, and standing.  Relief from meds is fair.  Ability to climb steps is intact.  She does  not drive.   Her review of systems is positive for dizziness, depression, and  anxiety.  She has had suicidal thoughts, but no plan with more passive  ideations, she gets followed by her psychiatrist every 2 weeks.  Monitored quite closely.  Other review of systems is positive for  diarrhea.   SOCIAL HISTORY:  Divorced, lives alone, smokes one third of a pack per  day.   Review of lumbar myelogram I looked at the films as well as the report,  this was done in a standard position as well as supine position.  She  had dynamic instability L4-5 due to facet arthropathy at that level,  also had some bilateral L5 encroachment with standing and  anterolisthesis L4 on L5, which had mild reduction with extension.   PHYSICAL EXAMINATION:  GENERAL:   No acute stress.  Mood and affect flat,  but otherwise appropriate.  NECK:  Range of motion is full.  UPPER EXTREMITIES:  Strength, range of motion, and sensation are all  normal.  Deep tendon reflexes are normal.  LOWER EXTREMITIES:  She has full range of motion in bilateral hips,  knees, and ankles.  Normal sensation in the lower extremities.  Normal  deep tendon reflexes in the lower extremities.  BACK:  Her back has tenderness to palpation in the lower lumbosacral  junction bilaterally, but worse on the left than on the right.  She has  a 0-25% of normal extension, this is very painful when she extends  particularly toward the left side.  In flexion, she has 50% of normal  range of motion and while this is painful at end range, it is not as  painful as with extension.   Lower extremities without edema.  No evidence of erythema.  No joint  swelling noted in the upper or lower extremity.   IMPRESSION:  Lumbar facet arthropathy, certainly impressive on lumbar  myelogram CT and correlating signs of pain with extension.  For this  reason, I am setting her up for medial branch blocks, L5 dorsal ramus,  L3-L4 medial branch.  She may be a candidate for radiofrequency  neurotomy if she only gets temporary relief with medial-branch  blocks.  I have discussed this with her, given some literature, she elects to  proceed with this.   She also has a followup with Dr. Darrelyn Mcclure next week.   Does not appear to have any radicular signs at this time, we will hold  off any type of epidural injections.      [name], M.D.  Electronically Signed     [facility]  D:  11/27/2008 14:43:57  T:  11/28/2008 04:44:09  Job #:  [id number]   cc:   [name], M.D.  Fax: [phone]   Jordan Courts, MD  Fax: [phone]   Dr. Tiburcio Mcclure   Dr. Leotis Mcclure

## 2010-08-05 ENCOUNTER — Telehealth: Payer: Self-pay | Admitting: Internal Medicine

## 2010-08-05 ENCOUNTER — Emergency Department (HOSPITAL_COMMUNITY): Payer: Medicare Other

## 2010-08-05 ENCOUNTER — Emergency Department (HOSPITAL_COMMUNITY)
Admission: EM | Admit: 2010-08-05 | Discharge: 2010-08-05 | Disposition: A | Discharge status: Home / Self Care | Payer: Medicare Other | Attending: Emergency Medicine | Admitting: Emergency Medicine

## 2010-08-05 DIAGNOSIS — J4489 Other specified chronic obstructive pulmonary disease: Secondary | ICD-10-CM | POA: Insufficient documentation

## 2010-08-05 DIAGNOSIS — R071 Chest pain on breathing: Secondary | ICD-10-CM | POA: Insufficient documentation

## 2010-08-05 DIAGNOSIS — W1809XA Striking against other object with subsequent fall, initial encounter: Secondary | ICD-10-CM | POA: Insufficient documentation

## 2010-08-05 DIAGNOSIS — IMO0002 Reserved for concepts with insufficient information to code with codable children: Secondary | ICD-10-CM | POA: Insufficient documentation

## 2010-08-05 DIAGNOSIS — R062 Wheezing: Secondary | ICD-10-CM | POA: Insufficient documentation

## 2010-08-05 DIAGNOSIS — J449 Chronic obstructive pulmonary disease, unspecified: Secondary | ICD-10-CM | POA: Insufficient documentation

## 2010-08-05 DIAGNOSIS — R079 Chest pain, unspecified: Secondary | ICD-10-CM | POA: Insufficient documentation

## 2010-08-05 DIAGNOSIS — F341 Dysthymic disorder: Secondary | ICD-10-CM | POA: Insufficient documentation

## 2010-08-05 NOTE — Telephone Encounter (Signed)
ED Physician Assistant called to see if we can get a close follow up appointment to follow up ED's visit for mild COPD exacerbation.

## 2010-08-10 ENCOUNTER — Ambulatory Visit: Payer: Medicare Other | Admitting: Internal Medicine

## 2010-08-11 NOTE — Telephone Encounter (Signed)
Has full Medicaid.  CCME paperwork faxed on 07/20/10.

## 2010-09-08 ENCOUNTER — Encounter (HOSPITAL_COMMUNITY): Payer: Self-pay | Admitting: Radiology

## 2010-09-08 ENCOUNTER — Encounter: Payer: Self-pay | Admitting: Internal Medicine

## 2010-09-08 ENCOUNTER — Inpatient Hospital Stay (HOSPITAL_COMMUNITY)
Admission: EM | Admit: 2010-09-08 | Discharge: 2010-09-14 | DRG: 191 | Disposition: A | Discharge status: Skilled Nursing Facility | Payer: Medicare Other | Attending: Internal Medicine | Admitting: Internal Medicine

## 2010-09-08 ENCOUNTER — Telehealth: Payer: Self-pay | Admitting: Internal Medicine

## 2010-09-08 ENCOUNTER — Emergency Department (HOSPITAL_COMMUNITY): Payer: Medicare Other

## 2010-09-08 DIAGNOSIS — E876 Hypokalemia: Secondary | ICD-10-CM | POA: Diagnosis present

## 2010-09-08 DIAGNOSIS — F17200 Nicotine dependence, unspecified, uncomplicated: Secondary | ICD-10-CM | POA: Diagnosis present

## 2010-09-08 DIAGNOSIS — J440 Chronic obstructive pulmonary disease with acute lower respiratory infection: Principal | ICD-10-CM | POA: Diagnosis present

## 2010-09-08 DIAGNOSIS — J209 Acute bronchitis, unspecified: Principal | ICD-10-CM | POA: Diagnosis present

## 2010-09-08 DIAGNOSIS — F1010 Alcohol abuse, uncomplicated: Secondary | ICD-10-CM | POA: Diagnosis present

## 2010-09-08 DIAGNOSIS — F319 Bipolar disorder, unspecified: Secondary | ICD-10-CM | POA: Diagnosis present

## 2010-09-08 DIAGNOSIS — G894 Chronic pain syndrome: Secondary | ICD-10-CM | POA: Diagnosis present

## 2010-09-08 DIAGNOSIS — R0902 Hypoxemia: Secondary | ICD-10-CM | POA: Diagnosis present

## 2010-09-08 DIAGNOSIS — Z9181 History of falling: Secondary | ICD-10-CM

## 2010-09-08 DIAGNOSIS — Z23 Encounter for immunization: Secondary | ICD-10-CM

## 2010-09-08 DIAGNOSIS — IMO0002 Reserved for concepts with insufficient information to code with codable children: Secondary | ICD-10-CM | POA: Diagnosis present

## 2010-09-08 DIAGNOSIS — E871 Hypo-osmolality and hyponatremia: Secondary | ICD-10-CM | POA: Diagnosis present

## 2010-09-08 LAB — PRO B NATRIURETIC PEPTIDE: Pro B Natriuretic peptide (BNP): 404.8 pg/mL — ABNORMAL HIGH (ref 0–125)

## 2010-09-08 LAB — CBC
MCHC: 35.6 g/dL (ref 30.0–36.0)
Platelets: 324 10*3/uL (ref 150–400)
RDW: 12.4 % (ref 11.5–15.5)

## 2010-09-08 LAB — POCT I-STAT, CHEM 8
Hemoglobin: 14.3 g/dL (ref 12.0–15.0)
Sodium: 126 mEq/L — ABNORMAL LOW (ref 135–145)
TCO2: 30 mmol/L (ref 0–100)

## 2010-09-08 LAB — BASIC METABOLIC PANEL
BUN: 5 mg/dL — ABNORMAL LOW (ref 6–23)
Potassium: 3.9 mEq/L (ref 3.5–5.1)
Sodium: 127 mEq/L — ABNORMAL LOW (ref 135–145)

## 2010-09-08 LAB — CK TOTAL AND CKMB (NOT AT ARMC)
CK, MB: 6.4 ng/mL (ref 0.3–4.0)
Total CK: 202 U/L — ABNORMAL HIGH (ref 7–177)

## 2010-09-08 NOTE — H&P (Signed)
Hospital Admission Note Date: 09/08/2010  Patient name: [name] Medical record number: [id number] Date of birth: 20-Jan-1951 Age: 60 y.o. Gender: female PCP: Carrolyn Meiers, MD  Medical Service: Internal Medicine Teaching Service B-2  Attending physician: Dr. Rod Can Resident [phone]): Dr. Lars Mage   Pager: [phone] Resident (R1): Dr. Lyn Hollingshead   Pager: [phone]  Chief Complaint: Falls, Bronchitis  History of Present Illness: [name] is a 60 year old woman who presented to the [facility] ER from [facility].  She was at her visit today at their office and states that she feel in the waiting room.  She denies any LOC, dizziness, or seizure activity.  She states that her legs felt weak.  She states that she normally is able to walk from [facility] where she lives to her grocery store and [employer] which is approximately 5 blocks away but over the last few months has gotten progressively weaker.  She states that she has had falls in the past.  Per her pain clinics last notes they have been working to taper her off her Norco onto only a Social research officer, government.  EMS reported that the patient states she took 2 of her Norco and 2 of her Klonipin before coming to her appointment.  On arrival in the ER she was somnolent but arousable. She also had an unwitnessed fall in the ER while trying to get up to use the bathroom.    She states that she has recently cut back her smoking from 2 ppd which she has done since she was 18 to 2-3 cigarettes per day.  In the ER she was found to be saturating 87% on RA and had significant bilateral wheezes.  She denies fever, chills, nausea, vomiting, diarrhea, constipation, increased sputum production.  She states she has had some SOB with activity recently.   Current Outpatient Prescriptions  Medication Sig Dispense Refill  . albuterol (VENTOLIN HFA) 108 (90 BASE) MCG/ACT inhaler Inhale 1 puff into the lungs every 6 (six) hours as needed. For wheezing       .  alendronate (FOSAMAX) 35 MG tablet Take 35 mg by mouth every 7 (seven) days. Take in the morning with a full glass of water, on an empty stomach, and do not take anything else by mouth or lie down for the next 30 min.      . calcium carbonate (OS-CAL) 600 MG TABS Take 600 mg by mouth 2 (two) times daily.        . cholecalciferol (VITAMIN D) 1000 UNITS tablet Take 2,000 Units by mouth daily.        . clonazePAM (KLONOPIN) 1 MG tablet Take 1 mg by mouth at bedtime.        . DULoxetine (CYMBALTA) 30 MG capsule Take 30 mg by mouth. Take 1 capsule in the morning an 2 at bedtime (Mental Health)      . fexofenadine-pseudoephedrine (ALLEGRA-D) 60-120 MG per tablet Take 1 tablet by mouth 2 (two) times daily.  30 tablet  2  . fluticasone (FLONASE) 50 MCG/ACT nasal spray 2 sprays by Nasal route daily.  16 g  2  . guaiFENesin (MUCINEX) 600 MG 12 hr tablet Take 1 tablet (600 mg total) by mouth 2 (two) times daily.  60 tablet  2  . HYDROcodone-acetaminophen (NORCO) 10-325 MG per tablet Take  1/2 to 1 tablet by mouth every 8 hours as needed. Given by Ernestina Penna faucette office- pain doctor      .  ipratropium (ATROVENT) 0.03 % nasal spray 2 sprays by Nasal route 3 (three) times daily.  30 mL  2  . lamoTRIgine (LAMICTAL) 100 MG tablet Take by mouth. 1 tablet in the morning and 2 tablets at night       . OLANZapine (ZYPREXA) 20 MG tablet Take 20 mg by mouth at bedtime.        . sodium chloride (OCEAN NASAL SPRAY) 0.65 % nasal spray 1 spray by Nasal route as needed for congestion.  45 mL  3  . zolpidem (AMBIEN) 5 MG tablet Take 7.5 mg by mouth at bedtime as needed. For sleep        Allergies: Review of patient's allergies indicates no known allergies.  Past Medical History  Diagnosis Date  . Depression   . Hypertension   . Suicidal ideation 2007     attempted overdose 2012  Dr. Gwendalyn Ege report  . Bipolar disorder   . Substance abuse      narcotics, alcohol, tobacco  . Active smoker    No past surgical  history on file.  Family History  Problem Relation Age of Onset  . Stroke Neg Hx   . Cancer Neg Hx    History   Social History  . Marital Status: Single    Spouse Name: N/A    Number of Children: N/A  . Years of Education: N/A   Occupational History  . Not on file.   Social History Main Topics  . Smoking status: Current Everyday Smoker -- 0.3 packs/day    Types: Cigarettes  . Smokeless tobacco: Not on file  . Alcohol Use: No  . Drug Use: No  . Sexually Active: Not on file   Other Topics Concern  . Not on file   Social History Narrative  . No narrative on file   Review of Systems: ROS was negative except for as indicated in the HPI.  Physical Exam: Vitals: Tm: 98.7  P: 81-108  BP: 137/71  Resp: 22 O2 Sat: 96% on 2L  Constitutional: Vital signs reviewed.  Patient is a thin elderly appearing woman in no acute distress and cooperative with exam. Alert and oriented x3.  Head: Normocephalic and atraumatic Ear: TM normal bilaterally Mouth: no erythema or exudates, MMM Eyes: PERRL, EOMI, conjunctivae normal, No scleral icterus.  Neck: Supple, Trachea midline normal ROM, No JVD, mass, thyromegaly, or carotid bruit present.  Cardiovascular: RRR, S1 normal, S2 normal, no MRG, pulses symmetric and intact bilaterally Pulmonary/Chest: Diffuse expiratory wheezes bilaterally, no crackles or rhonchi noted.  Abdominal: Soft. Non-tender, non-distended, bowel sounds are normal, no masses, organomegaly, or guarding present.  GU: no CVA tenderness Musculoskeletal: No joint deformities, erythema, or stiffness, ROM full and no nontender Hematology: no cervical, inginal, or axillary adenopathy.  Neurological: A&O x3, Strength is symmetric bilaterally and only mildly diminished in the lower extremities. Cranial nerve II-XII are grossly intact, no focal motor deficit, sensory intact to light touch bilaterally.  No ankle clonus noted and DTR are normal Skin: Warm, dry and intact. No rash,  cyanosis, or clubbing.  Psychiatric: Normal mood and affect. speech and behavior is normal. Judgment and thought content normal. Cognition and memory are normal.    Lab results: Admission on 09/08/2010  Component Value Range  . Sodium (mEq/L) 126* 135-145  . Potassium (mEq/L) 4.1  3.5-5.1  . Chloride (mEq/L) 86* 96-112  . BUN (mg/dL) <3* 0-45  . Creatinine, Ser (mg/dL) 4.09  8.11-9.14  . Glucose, Bld (mg/dL) 86  78-29  .  Calcium, Ion (mmol/L) 0.88* 1.12-1.32  . TCO2 (mmol/L) 30  0-100  . Hemoglobin (g/dL) 16.1  09.6-04.5  . HCT (%) 42.0  36.0-46.0  . WBC (K/uL) 9.3  4.0-10.5  . RBC (MIL/uL) 4.29  3.87-5.11  . Hemoglobin (g/dL) 40.9  81.1-91.4  . HCT (%) 38.5  36.0-46.0  . MCV (fL) 89.7  78.0-100.0  . MCH (pg) 31.9  26.0-34.0  . MCHC (g/dL) 78.2  95.6-21.3  . RDW (%) 12.4  11.5-15.5  . Platelets (K/uL) 324  150-400  . Sodium (mEq/L) 127* 135-145  . Potassium (mEq/L) 3.9  3.5-5.1  . Chloride (mEq/L) 84* 96-112  . CO2 (mEq/L) 33* 19-32  . Glucose, Bld (mg/dL) 75  08-65  . BUN (mg/dL) 5* 7-84  . Creatinine, Ser (mg/dL) <6.96* 2.95-2.84  . Calcium (mg/dL) 7.7* 1.3-24.4  . GFR calc non Af Amer (mL/min) NOT CALCULATED  >60  . GFR calc Af Amer (mL/min) NOT CALCULATED  >60   Imaging results:  Chest X-ray:  IMPRESSION:   Severe chronic obstructive lung disease/emphysema.  More prominent   interstitial markings in the lower lungs likely reflect bronchitis.  Interstitial pneumonia in that region could have a similar  appearance.    Question 1 cm density lateral to the right hilum.  This may relate  to the current inflammatory process, but followup is suggested to  assure this does not represent a developing lung mass.  CT of the C-Spine   Findings: No evidence of fracture or traumatic malalignment.  There is mild spondylosis at C4-5, C5-6 and C6-7 with small uncovertebral osteophytes.  Head CT w/o CM Findings: The brain has an unremarkable appearance without evidence  of atrophy, old  or acute infarction, mass lesion, hemorrhage,  hydrocephalus or extra-axial collection.  There is minimal mucosal  thickening in the right maxillary sinus.  X-ray of the [facility]   IMPRESSION:  Negative  Assessment & Plan by Problem: 1. SOB: this is likely multifactorial. Patient physical exam finding of diffuse wheezing and rhonchi and CXR finding consistent with acute bronchitis and severe emphysema,  COPD exacerbation is likely. She has no current diagnosis of COPD and no PFTs but clinically she appears to have this as well as her extensive smoking history.  Also infiltration on CXR could represent interstitial pneumonia, she could be harboring an infectious process, which could have triggered her COPD. Also will need to rule out acute coronary processes too.  Plan: - Cycle cardiac enzymes,  - Avelox for PNA/Bronchitis - Albuterol and Atrovent nebs - Prednisone taper - Will consider CT chest after acute presentation to better define the underlying process and evaluate the 1 cm pulmonary nodule found of CXR which will need to be followed up regardless in about 6 weeks.   2. Fall: lost her balance. She does not c/o any dizziness, no LOC. She did hit her head but does not c/o any headaches/nausea or vomiting. The etiology of the fall is uncertain at this time but is most likely accidental from the history of narcotic abuse and acute bronchitis presentation, but could be postural hypotension vs palpitations vs acute rhythm abnormality that pripated it. We will Monitor her on Tele. Start her on IV fluids.  3. Hyponatremia: serum osmolality 259.9, differential for hypo-osmolar hyponatremia include volume depletion, hormonal changes, such adrenal insufficiency, hypothyroidism, and ectopic production of atrial natriuretic peptide also advanced renal failure, primary polydipsia, and low dietary solute intake. Given the patients history, physical exam, volume depletion is the most likely cause. Will  provide  IVF and continue to monitor for improvement, if it worsens hyponatremia with IVF consider a diagnosis of SIADH.   4. Anxiety/Depression/Bipolar: Patient denies suicidal ideation, depression is at baseline, however this depression could be contributing to her pain. Will contine home meds meds.      R2/3______________________________      R1________________________________  ATTENDING: I performed and/or observed a history and physical examination of the patient.  I discussed the case with the residents as noted and reviewed the residents' notes.  I agree with the findings and plan--please refer to the attending physician note for more details.  Signature________________________________  Printed Name_____________________________

## 2010-09-08 NOTE — Telephone Encounter (Signed)
Called by PA at pain management office. Patient in for routine visit with their office found to be ataxic with severe gait disturbance. Not felt to be related to opiate pain medication. Patient being sent to ED by EMS because of severity and the fact that she could not walk safely. She has been seen on other occasions in ED and sent home, progresive and worsening. PA asked if IM service would admit patient. I notified the on call resident to review patients ED records and if needed to touch base with ED physician about possible admission for ataxix gait. Needs complete w/u including attention to psych meds/toxicity and head imaging.

## 2010-09-09 DIAGNOSIS — R52 Pain, unspecified: Secondary | ICD-10-CM

## 2010-09-09 LAB — BASIC METABOLIC PANEL
BUN: 4 mg/dL — ABNORMAL LOW (ref 6–23)
Chloride: 97 mEq/L (ref 96–112)
Potassium: 4.1 mEq/L (ref 3.5–5.1)

## 2010-09-09 LAB — CARDIAC PANEL(CRET KIN+CKTOT+MB+TROPI)
Relative Index: 2.9 — ABNORMAL HIGH (ref 0.0–2.5)
Relative Index: 2.6 — ABNORMAL HIGH (ref 0.0–2.5)
Total CK: 168 U/L (ref 7–177)
Total CK: 141 U/L (ref 7–177)
Troponin I: <0.3 ng/mL (ref <0.30)
Troponin I: <0.3 ng/mL (ref <0.30)

## 2010-09-09 LAB — CBC
HCT: 42 % (ref 36.0–46.0)
MCV: 93.1 fL (ref 78.0–100.0)
RDW: 12.8 % (ref 11.5–15.5)
WBC: 8 10*3/uL (ref 4.0–10.5)

## 2010-09-10 ENCOUNTER — Inpatient Hospital Stay (HOSPITAL_COMMUNITY): Payer: Medicare Other

## 2010-09-10 DIAGNOSIS — R0602 Shortness of breath: Secondary | ICD-10-CM

## 2010-09-10 DIAGNOSIS — Z9181 History of falling: Secondary | ICD-10-CM

## 2010-09-10 DIAGNOSIS — F3112 Bipolar disorder, current episode manic without psychotic features, moderate: Secondary | ICD-10-CM

## 2010-09-11 LAB — BASIC METABOLIC PANEL
BUN: 8 mg/dL (ref 6–23)
Calcium: 7.9 mg/dL — ABNORMAL LOW (ref 8.4–10.5)
Chloride: 98 mEq/L (ref 96–112)
Glucose, Bld: 94 mg/dL (ref 70–99)
Potassium: 3.1 mEq/L — ABNORMAL LOW (ref 3.5–5.1)
Sodium: 143 mEq/L (ref 135–145)

## 2010-09-11 LAB — CBC
Hemoglobin: 12.7 g/dL (ref 12.0–15.0)
MCHC: 33.9 g/dL (ref 30.0–36.0)
Platelets: 359 10*3/uL (ref 150–400)
RDW: 13.3 % (ref 11.5–15.5)

## 2010-09-12 LAB — BASIC METABOLIC PANEL
BUN: 9 mg/dL (ref 6–23)
Chloride: 99 mEq/L (ref 96–112)
Creatinine, Ser: <0.47 mg/dL — ABNORMAL LOW (ref 0.50–1.10)
Glucose, Bld: 117 mg/dL — ABNORMAL HIGH (ref 70–99)
Potassium: 4.5 mEq/L (ref 3.5–5.1)

## 2010-09-12 LAB — GLUCOSE, CAPILLARY

## 2010-09-12 LAB — MAGNESIUM: Magnesium: 1.9 mg/dL (ref 1.5–2.5)

## 2010-09-13 DIAGNOSIS — Z9181 History of falling: Secondary | ICD-10-CM

## 2010-09-13 DIAGNOSIS — R0602 Shortness of breath: Secondary | ICD-10-CM

## 2010-09-13 LAB — BASIC METABOLIC PANEL
BUN: 11 mg/dL (ref 6–23)
CO2: 39 mEq/L — ABNORMAL HIGH (ref 19–32)
Calcium: 7.6 mg/dL — ABNORMAL LOW (ref 8.4–10.5)
Creatinine, Ser: 0.48 mg/dL — ABNORMAL LOW (ref 0.50–1.10)
GFR calc non Af Amer: >60 mL/min (ref >60)
Glucose, Bld: 88 mg/dL (ref 70–99)
Sodium: 145 mEq/L (ref 135–145)

## 2010-09-14 DIAGNOSIS — Z9181 History of falling: Secondary | ICD-10-CM

## 2010-09-14 DIAGNOSIS — F3112 Bipolar disorder, current episode manic without psychotic features, moderate: Secondary | ICD-10-CM

## 2010-09-14 DIAGNOSIS — R0602 Shortness of breath: Secondary | ICD-10-CM

## 2010-09-17 ENCOUNTER — Telehealth: Payer: Self-pay | Admitting: *Deleted

## 2010-09-17 NOTE — Telephone Encounter (Signed)
Ms Mcclure calls, identifying herself as pt's sister, she states she has questions and concerns, i do not see her listed as a contact person but she states that she is listed at the nsg facility where pt is residing at the moment. She feels pt is overmedicating herself at home That she is falling because of first action And that pt is having mental changes... As in thinking she is staying at a hotel.  Her ph # is [phone], [name]

## 2010-10-13 HISTORY — PX: ANKLE DEBRIDEMENT: SHX1147

## 2010-10-13 HISTORY — PX: ORIF ANKLE FRACTURE: SUR919

## 2010-10-14 NOTE — Discharge Summary (Signed)
NAMEARMONI, KLUDT NO.:  [id number]  MEDICAL RECORD NO.:  [id number]  LOCATION:  [facility]                         FACILITY:  [facility]  PHYSICIAN:  Blanch Media, M.D.DATE OF BIRTH:  26-Nov-1950  DATE OF ADMISSION:  09/08/2010 DATE OF DISCHARGE:  09/14/2010                              DISCHARGE SUMMARY   ATTENDING PHYSICIAN:  Blanch Media, MD  DISCHARGE DIAGNOSES: 1. Shortness of breath secondary to chronic obstructive pulmonary     disease exacerbation. 2. Hyponatremia. 3. Fall secondary to polypharmacy. 4. Bipolar disorder. 5. Anxiety and depression. 6. Hypercarbia.  DISCHARGE MEDICATIONS: 1. Vitamin D 800 units p.o. daily with meal. 2. Cymbalta 30 mg p.o. q.a.m. 3. Cymbalta 60 mg p.o. at bedtime. 4. Advair 250/50 Diskus 1 puff inhaled b.i.d. 5. Mucinex 600 mg p.o. b.i.d. 6. Lamictal 200 mg p.o. at bedtime. 7. Avelox 400 mg p.o. daily x3 days. 8. Prednisone 10 mg take one tablet daily x1 day, then 2 tablets daily     x3 days, then 1 tablet x3 days then stop. 9. Calcium over-the-counter 1 tablet every morning. 10.Tylenol 650 b.i.d. p.r.n. pain. 11.Zyprexa 5 mg p.o. at bedtime.  DISPOSITION AND FOLLOWUP:  Ms. Abeln was discharged from [facility] on September 14, 2010 in stable and improved condition.  She has no other episode of fall and her altered mental status has resolved.  She will need to continue to take Avelox 400 mg p.o. daily x3 days as well as prednisone tapering dose as described above.  She will have a followup appointment with her primary care physician as well as Mental Health.  At that time, they will need to: 1. Reevaluate her mental meds. 2. Follow up on her pain control to ensure that the patient is     comfortable but not oversedated with pain medication.  She will     also need a followup with pain clinic. 3. Check a BMET to ensure that her hypercarbia has resolved. 4. Please follow up on her 1-cm lung nodule that was  noted on chest x-     ray on September 08, 2010.  Repeat a chest x-ray in at least 8 weeks to     ensure resolution of the nodule.  CONSULTATION:  None.  PROCEDURE PERFORMED: 1. CT head on September 08, 2010 shows negative CT head. 2. X-ray of pelvis on September 08, 2010 negative. 3. CT of cervical spine on September 08, 2010 showed no acute or traumatic     findings.  Minor degenerative changes of a chronic nature. 4. Chest x-ray on September 08, 2010, shows severe chronic obstructive lung     disease/emphysema.  More prominent interstitial markings in the     lower lung, likely reflect bronchitis.  Interstitial pneumonia in     that region could have a similar appearance.  Question 1 cm density     lateral to the right hilum.  This may relate to the current     inflammatory process, but followup is suggested to ensure that this     does not represent a developing lung mass. 5. Chest x-ray on September 10, 2010 shows emphysema with stable right  peribronchial thickening and mild nodular opacity from recent exam     - likely infectious.  No interval worsening ventilation.  Possible     small right pleural effusion.  ADMISSION HISTORY:  Ms. Strege is a 60 year old woman who presented to the ED from [facility].  She was visiting the office on the day of admission and states that she fell in the waiting room. She denied any loss of consciousness, dizziness, or seizure activity. She states that her legs felt weak.  She stated she is normally is able to walk from [facility] where she lives to grocery store and [employer] which is approximately 5 blocks away but over the last few months has gotten progressively weaker.  She stated that she has fallen in the past.  Per pain clinic last note, they have been working to taper her off Norco on to [employer] patch.  EMS reported that the patient states she took 2 pills of her Norco and 2 of her Klonopin before coming to her appointment.  On arrival, she was  somnolent but arousable.  She also had an unwitnessed fall in the ER while trying to get up to use the bathroom.  She states that she has recently cut back her smoking from 2 packs per day when she was 18 to 2-3 cigarettes per day.  In the ER, she was found to be saturating 87% on room air and had significant bilateral wheezes.  She denies any fever, chills, nausea and vomiting, diarrhea, constipation, increased sputum production.  The patient states that she has some shortness of breath with activity recently.  ADMISSION PHYSICAL EXAMINATION:  VITAL SIGNS:  Temperature 98.7, pulse 81-108, blood pressure 137/71, respirations 22, O2 sat 96% on 2 L of nasal cannula. GENERAL:  An elderly appearing woman in no acute distress, cooperative to examination, alert and oriented x3. HEENT:  Normocephalic, atraumatic.  TMs normal bilaterally.  Pupil equal, round, reactive to light and accommodation.  Extraocular movement intact.  Conjunctivae normal.  No scleral icterus. NECK:  Supple.  Trachea midline.  Normal range of motion.  No JVD.  No thyromegaly.  No carotid bruits. CARDIOVASCULAR:  Regular rate and rhythm.  S1, S2 normal.  No murmur, gallop or rubs.  Pulses symmetric and intact bilaterally. PULMONARY:  Diffuse expiratory wheezes bilaterally.  No crackle or rhonchi noted. ABDOMEN:  Soft, nontender, nondistended.  Normal bowel sounds.  No masses or organomegaly. GU:  No CVA tenderness. MUSCULOSKELETAL:  No joint deformity, erythema, or stiffness.  Full range of motion. NEUROLOGIC:  Alert and oriented x3.  Strength is symmetric bilaterally and only mildly diminished in the lower extremities.  Cranial nerves II through XII grossly intact.  No focal motor deficit.  Sensation intact to light touch.  No ankle clonus noted and DTR are normal. SKIN:  Warm, dry, and intact.  No rashes, cyanosis, or clubbing. PSYCHIATRIC:  Normal mood and affect.  Speech and behavior normal. Judgment and thought  content normal.  Cognition and memory are normal.  ADMISSION LABORATORY DATA:  Sodium 127, potassium 3.9, chloride 84, bicarb 33, glucose 75, BUN 5, creatinine less than 0.47, calcium 7.7, WBC 9.3, hematocrit 43, hemoglobin 13.7, MCV 89.7, platelets 324.  HOSPITAL COURSE: 1. Shortness of breath is likely multifactorial.  Chest x-ray was     consistent with acute bronchitis and severe emphysema, COPD     exacerbation.  The patient was treated with Avelox, albuterol, and     Atrovent nebs as well as  a prednisone tapering dose.  During PT and     OT, the patient desaturated down to 79% on room air.  The patient     will likely need home oxygen at time of discharge.  Her shortness     of breath continued to improve throughout hospital course and it is     recommended that she get an outpatient PFT as well as a repeat     chest x-ray in 6-8 weeks to ensure the resolution of a 1-cm     pulmonary nodule found on x-ray on September 08, 2010. 2. Fall secondary to loss of balance as well as polypharmacy.  The     patient denies any dizziness or loss of consciousness.  The patient     does have a history of narcotic abuse as well as on multi mental     health medications.  We discontinued Klonopin, Ambien, MS Contin,  morphine as this could be the culprit for her fall.  The patient     did not have any falling episode during this hospital course. 3. Hyponatremia with a sodium of 127 and a serum osmolality of 259.9.     Given the patient's history and physical examination, volume     depletion is the most likely cause.  The patient was given IVF     normal saline and her hyponatremia resolved with IVF repletion. 4. Bipolar disorder.  The patient denies any suicidal ideation or     homicidal ideation, or any visual or auditory lesion.  We continued     her Cymbalta as well as her Zyprexa during hospital course.  We     discontinued her other mental health medication including Klonopin,     Ambien. 5.  Chronic pain syndrome.  Pain was stable and it was controlled with     Tylenol. 6. Hypercarbia likely secondary to COPD.  We will continue to monitor     her BMET as outpatient as well as getting a PFT and smoking     cessation counseling.  DISCHARGE VITAL SIGNS:  Temperature 98.1, pulse 81, blood pressure 150/81, respirations 20, O2 sat 92% on 2 L nasal cannula.  DISCHARGE LABORATORY DATA:  Sodium 145, potassium 4.0, chloride 100, bicarb 39, glucose 88, BUN 11, creatinine 0.48, calcium 7.6.    ______________________________ Carrolyn Meiers, MD   ______________________________ Blanch Media, M.D.    MH/MEDQ  D:  09/14/2010  T:  09/14/2010  Job:  [id number]  Electronically Signed by Carrolyn Meiers MD on 09/28/2010 10:45:38 AM Electronically Signed by Blanch Media M.D. on 10/14/2010 10:31:16 AM

## 2010-10-19 ENCOUNTER — Ambulatory Visit (INDEPENDENT_AMBULATORY_CARE_PROVIDER_SITE_OTHER): Payer: Medicare Other | Admitting: Internal Medicine

## 2010-10-19 ENCOUNTER — Encounter: Payer: Self-pay | Admitting: Internal Medicine

## 2010-10-19 ENCOUNTER — Ambulatory Visit (HOSPITAL_COMMUNITY)
Admission: RE | Admit: 2010-10-19 | Discharge: 2010-10-19 | Disposition: A | Discharge status: Home / Self Care | Payer: Medicare Other | Source: Ambulatory Visit | Attending: Internal Medicine | Admitting: Internal Medicine

## 2010-10-19 VITALS — BP 121/67 | HR 101 | Temp 97.9°F | Ht 61.0 in | Wt 97.8 lb

## 2010-10-19 DIAGNOSIS — R0602 Shortness of breath: Secondary | ICD-10-CM

## 2010-10-19 DIAGNOSIS — Z8659 Personal history of other mental and behavioral disorders: Secondary | ICD-10-CM

## 2010-10-19 DIAGNOSIS — J984 Other disorders of lung: Secondary | ICD-10-CM | POA: Insufficient documentation

## 2010-10-19 DIAGNOSIS — J329 Chronic sinusitis, unspecified: Secondary | ICD-10-CM

## 2010-10-19 MED ORDER — ZOLPIDEM TARTRATE 5 MG PO TABS: 5.0000 mg | ORAL_TABLET | Freq: Every evening | ORAL | Status: DC | PRN

## 2010-10-19 MED ORDER — IPRATROPIUM BROMIDE 0.03 % NA SOLN: 2.0000 | Freq: Three times a day (TID) | NASAL | Status: DC

## 2010-10-19 MED ORDER — FLUTICASONE-SALMETEROL 250-50 MCG/DOSE IN AEPB: 1.0000 | INHALATION_SPRAY | Freq: Two times a day (BID) | RESPIRATORY_TRACT | Status: DC

## 2010-10-19 MED ORDER — GABAPENTIN 400 MG PO TABS: 400.0000 mg | ORAL_TABLET | Freq: Two times a day (BID) | ORAL | Status: DC

## 2010-10-19 MED ORDER — ALBUTEROL SULFATE HFA 108 (90 BASE) MCG/ACT IN AERS: 1.0000 | INHALATION_SPRAY | RESPIRATORY_TRACT | Status: DC | PRN

## 2010-10-19 MED ORDER — OLANZAPINE 5 MG PO TABS: 20.0000 mg | ORAL_TABLET | Freq: Every day | ORAL | Status: DC

## 2010-10-19 MED ORDER — TRAMADOL HCL 50 MG PO TABS: 50.0000 mg | ORAL_TABLET | Freq: Three times a day (TID) | ORAL | Status: DC | PRN

## 2010-10-19 NOTE — Progress Notes (Signed)
History of present illness: [name] is a 60 year old woman with past medical history bipolar, shortness of breath, anxiety and depression, chronic back and hip pain presents today for hospital followup. Patient was recently admitted on 09/08/2010 for shortness of breath most likely secondary to COPD exacerbation given her long history of smoking; however patient has never had a PFT done in the past.  In addition, chest x-ray on 09/08/2010 showed a 1 cm nodular density lateral to the right hilum and that radiology recommended repeat chest x-ray.  Since hospital discharge, patient went to rehabilitation for her deconditioning and that she has been home in the past one week.   She finished the prednisone tapering course as well as Avelox. She reports much better and that she is not drowsy or somnolent. She has not had any more falls since she left the hospital. She does report having short of breath when she walks about one to 2 blocks. She denies any chest pain, fever, chills, nausea or vomiting, or any other focal weaknesses.  She continues to have left lower back and hip pain for which she follows at a pain clinic. She also reported that she will see pain management tomorrow 10/20/2002. For her pain regimen, currently she is on Vicodin 5/325 mg by mouth Q8 hours when necessary pain, gabapentin 400 mg twice a day, and tramadol 50 mg by mouth Q8 hours when necessary. She is also going to followup with mental health this week since some of her medication were discontinued during the hospital secondary to polypharmacy and her drowsiness (Ambien and Klonopin), Zyprexa was also decreased to 5mg  and Lamictal was also decreased to 200 mg by mouth each bedtime. Of note, however since hospital discharge, patient continues to take all her medications including the ones that were supposed to be discontinued.  Review of system: As per history of present illness  Physical examination: General: alert, thin-appearing using  rolling-walker, and cooperative to examination.   Lungs: normal respiratory effort, no accessory muscle use, coarse breath sounds, no crackles, and no wheezes. Heart: normal rate, regular rhythm, no murmur, no gallop, and no rub.  Abdomen: soft, non-tender, normal bowel sounds, no distention, no guarding, no rebound tenderness Msk: no joint swelling, no joint warmth, and no redness over joints.  Pulses: 2+ DP/PT pulses bilaterally Extremities: No cyanosis, clubbing, edema Neurologic: alert & oriented X3, cranial nerves II-XII intact, strength normal in all extremities, sensation intact to light touch, and gait normal.  Skin: turgor normal and no rashes.  Psych: Oriented X3, memory intact for recent and remote, normally interactive, good eye contact, not anxious appearing, and not depressed appearing. Denies any SI or HI or hallucinations

## 2010-10-19 NOTE — Patient Instructions (Signed)
Stop taking Klonopin because it could make you more drowsy Please only take Tramadol if you are in severe pain as we discussed during the office visit because it could make you very drowsy and make you fall.  You need to put that medication away!! Follow up with pain clinic and mental health Please get chest xray and labs today.  I will call you with any abnormal lab results. Follow up with Dr. Anselm Jungling in 1-2 months

## 2010-10-19 NOTE — Assessment & Plan Note (Addendum)
This is likely secondary to COPD exacerbation given her strong smoking history.  She continues to smoke she continues to smoke 3-4 cigarettes per day whenever she has her meals as well as coffee. However patient has never had a PFTs in the past to evaluate her lung function. Today on room air her O2 sat was 92%; however, when she started to ambulate her O2 sat drops down to 88%. During hospital course, during one of her PT and OT sessions, patient actually desaturated down to 79% on room air.  It is recommended that patient use home oxygen to prevent pulmonary hypertension which is not controlled, she will eventually developed cor pulmonale.  Patient is somewhat reluctant to use home O2 at/however she states that she will try. I would like for patient to use at least 18 hours of oxygen daily in order to receive the maximum benefit. -Prescribed albuterol inhaler when necessary shortness of breath -Advised patient to continue using her Atrovent/Advair inhaler -Referral for PFT -Repeat chest x-ray to ensure that her 1 cm nodule density lateral to the right hilum is now resolved which was seen on 09/08/2010 x-ray (recommended by radiology). -Patient will followup with me in 3-4 months -Referral for home health for home O2 -Repeat BMP to make sure that her hypercarbia is now resolved.  Case discussed with Dr. Aundria Rud.

## 2010-10-19 NOTE — Assessment & Plan Note (Signed)
Stable, patient is doing well denies any SI/HI/ hallucinations. She states that her mood is great since hospital discharge.  During hospital course in June 2012, some of her medications were discontinued because of her increasing drowsiness/somnolent /recurrent falls; however patient continues to take off her medications including those that were discontinue or change in dosage. We discontinue her Klonopin and Ambien. We also decreased her Zyprexa to 5 mg by mouth each bedtime. We also decreased her Lamictal to 200 mg by mouth each bedtime.  Patient will followup with mental health this week and she was instructed to show mental health her new medication list so that the patient would not be prescribed a medication which would make her overly sedated. -Will refill her Ambien as she states that she cannot sleep without this medication -Continue Zyprexa 5 mg by mouth each bedtime -Continue Lamictal 200 mg by mouth each bedtime -Continue Cymbalta 30 mg every morning and 60 mg each bedtime -I will fax this note to her psychiatrist so that he/she will be updated  Case discussed with Dr. Aundria Rud

## 2010-10-20 LAB — BASIC METABOLIC PANEL WITH GFR
BUN: 14 mg/dL (ref 6–23)
Calcium: 8.1 mg/dL — ABNORMAL LOW (ref 8.4–10.5)
GFR, Est African American: >60 mL/min (ref >60)
GFR, Est Non African American: >60 mL/min (ref >60)
Potassium: 4.5 mEq/L (ref 3.5–5.3)
Sodium: 141 mEq/L (ref 135–145)

## 2010-10-25 ENCOUNTER — Emergency Department (HOSPITAL_COMMUNITY)
Admission: EM | Admit: 2010-10-25 | Discharge: 2010-10-25 | Disposition: A | Discharge status: Home / Self Care | Payer: Medicare Other | Attending: Emergency Medicine | Admitting: Emergency Medicine

## 2010-10-25 ENCOUNTER — Emergency Department (HOSPITAL_COMMUNITY): Payer: Medicare Other

## 2010-10-25 DIAGNOSIS — W19XXXA Unspecified fall, initial encounter: Secondary | ICD-10-CM | POA: Insufficient documentation

## 2010-10-25 DIAGNOSIS — R61 Generalized hyperhidrosis: Secondary | ICD-10-CM | POA: Insufficient documentation

## 2010-10-25 DIAGNOSIS — S92109A Unspecified fracture of unspecified talus, initial encounter for closed fracture: Secondary | ICD-10-CM | POA: Insufficient documentation

## 2010-10-25 DIAGNOSIS — R4182 Altered mental status, unspecified: Secondary | ICD-10-CM | POA: Insufficient documentation

## 2010-10-25 DIAGNOSIS — R404 Transient alteration of awareness: Secondary | ICD-10-CM | POA: Insufficient documentation

## 2010-10-25 DIAGNOSIS — Y92009 Unspecified place in unspecified non-institutional (private) residence as the place of occurrence of the external cause: Secondary | ICD-10-CM | POA: Insufficient documentation

## 2010-10-25 DIAGNOSIS — F29 Unspecified psychosis not due to a substance or known physiological condition: Secondary | ICD-10-CM | POA: Insufficient documentation

## 2010-10-25 LAB — BASIC METABOLIC PANEL
BUN: 8 mg/dL (ref 6–23)
Creatinine, Ser: 0.59 mg/dL (ref 0.50–1.10)
GFR calc Af Amer: >60 mL/min (ref >60)
GFR calc non Af Amer: >60 mL/min (ref >60)
Potassium: 3.8 mEq/L (ref 3.5–5.1)

## 2010-10-25 LAB — DIFFERENTIAL
Basophils Absolute: 0.1 10*3/uL (ref 0.0–0.1)
Basophils Relative: 1 % (ref 0–1)
Eosinophils Absolute: 0.1 10*3/uL (ref 0.0–0.7)
Monocytes Relative: 9 % (ref 3–12)
Neutrophils Relative %: 64 % (ref 43–77)

## 2010-10-25 LAB — POCT I-STAT, CHEM 8
Calcium, Ion: 1.05 mmol/L — ABNORMAL LOW (ref 1.12–1.32)
HCT: 36 % (ref 36.0–46.0)
Hemoglobin: 12.2 g/dL (ref 12.0–15.0)
Sodium: 138 mEq/L (ref 135–145)
TCO2: 28 mmol/L (ref 0–100)

## 2010-10-25 LAB — POCT I-STAT TROPONIN I: Troponin i, poc: 0.01 ng/mL (ref 0.00–0.08)

## 2010-10-25 LAB — CBC
MCH: 31.5 pg (ref 26.0–34.0)
Platelets: 351 10*3/uL (ref 150–400)
RBC: 3.65 MIL/uL — ABNORMAL LOW (ref 3.87–5.11)

## 2010-10-26 ENCOUNTER — Emergency Department (HOSPITAL_COMMUNITY): Payer: Medicare Other

## 2010-10-26 ENCOUNTER — Inpatient Hospital Stay (HOSPITAL_COMMUNITY)
Admission: EM | Admit: 2010-10-26 | Discharge: 2010-10-29 | DRG: 493 | Disposition: A | Discharge status: Skilled Nursing Facility | Payer: Medicare Other | Attending: Internal Medicine | Admitting: Internal Medicine

## 2010-10-26 ENCOUNTER — Encounter: Payer: Self-pay | Admitting: Internal Medicine

## 2010-10-26 DIAGNOSIS — S62339A Displaced fracture of neck of unspecified metacarpal bone, initial encounter for closed fracture: Secondary | ICD-10-CM | POA: Diagnosis present

## 2010-10-26 DIAGNOSIS — Z79899 Other long term (current) drug therapy: Secondary | ICD-10-CM

## 2010-10-26 DIAGNOSIS — F3130 Bipolar disorder, current episode depressed, mild or moderate severity, unspecified: Secondary | ICD-10-CM | POA: Diagnosis present

## 2010-10-26 DIAGNOSIS — W19XXXA Unspecified fall, initial encounter: Secondary | ICD-10-CM | POA: Diagnosis present

## 2010-10-26 DIAGNOSIS — R636 Underweight: Secondary | ICD-10-CM | POA: Diagnosis present

## 2010-10-26 DIAGNOSIS — J4489 Other specified chronic obstructive pulmonary disease: Secondary | ICD-10-CM | POA: Diagnosis present

## 2010-10-26 DIAGNOSIS — F17200 Nicotine dependence, unspecified, uncomplicated: Secondary | ICD-10-CM | POA: Diagnosis present

## 2010-10-26 DIAGNOSIS — S82853A Displaced trimalleolar fracture of unspecified lower leg, initial encounter for closed fracture: Principal | ICD-10-CM | POA: Diagnosis present

## 2010-10-26 DIAGNOSIS — S92109A Unspecified fracture of unspecified talus, initial encounter for closed fracture: Secondary | ICD-10-CM | POA: Diagnosis present

## 2010-10-26 DIAGNOSIS — Z681 Body mass index (BMI) 19 or less, adult: Secondary | ICD-10-CM

## 2010-10-26 DIAGNOSIS — Y92009 Unspecified place in unspecified non-institutional (private) residence as the place of occurrence of the external cause: Secondary | ICD-10-CM

## 2010-10-26 DIAGNOSIS — J449 Chronic obstructive pulmonary disease, unspecified: Secondary | ICD-10-CM | POA: Diagnosis present

## 2010-10-26 LAB — DIFFERENTIAL
Basophils Relative: 0 % (ref 0–1)
Eosinophils Absolute: 0 10*3/uL (ref 0.0–0.7)
Neutrophils Relative %: 89 % — ABNORMAL HIGH (ref 43–77)

## 2010-10-26 LAB — POCT I-STAT, CHEM 8
Calcium, Ion: 0.98 mmol/L — ABNORMAL LOW (ref 1.12–1.32)
Chloride: 102 mEq/L (ref 96–112)
HCT: 39 % (ref 36.0–46.0)
Sodium: 137 mEq/L (ref 135–145)
TCO2: 23 mmol/L (ref 0–100)

## 2010-10-26 LAB — CBC
Platelets: 374 10*3/uL (ref 150–400)
RBC: 3.92 MIL/uL (ref 3.87–5.11)
WBC: 13.8 10*3/uL — ABNORMAL HIGH (ref 4.0–10.5)

## 2010-10-26 NOTE — H&P (Signed)
Hospital Admission Note Date: 10/26/2010  Patient name:  [name]  Medical record number:  [id number] Date of birth:  1951-01-08  Age: 60 y.o. Gender:  female PCP:    Carrolyn Meiers, MD  Medical Service:   Internal Medicine Teaching Service   Attending physician:  Dr. Rogelia Boga First Contact:   Dr. Yaakov Guthrie  Pager: [phone] Second Contact:   Dr. Scot Dock  Pager: [phone] After Hours:    First Contact   Pager: [phone]      Second Contact  Pager: [phone]   Chief Complaint: fall  History of Present Illness: Patient is a 60 y.o. female with a PMHx depression, bipolar disorder, substance abuse and COPD who presents with a chief complaint of fall. Patient presented to the emergency room yesterday with similar complaints and had a ankle fracture of the right foot but left AGAINST MEDICAL ADVICE. Pateint states she went home, was out on her patio smoking a cigarette when she turned to get back into her house and fell. This happened at about 2130 last night. She states she woke up this morning with a left ankle deformity and left hand pain. Patient denied loss of consciousness, dizzyness, aura before or after the fall, loss of bladder or bowel control, palpitations, headache or seizure activity. She states that her legs felt weak. The fall was not witnessed.  She has a small bleeding abrasion on her right lower forehead.  She stated that she had decreased her klonopin and ambien as recommended for her evening dose yesterday, although she was advised to stop taking those meds in ED. Patient has had multiple episodes of fall in last several months including an admission in July 2012. Patient was seen by Dr. Anselm Jungling on August 7 when she discontinued Klonopin from her med list but patient continues to ignore the recommendations.    Current Outpatient Medications: Current Outpatient Prescriptions  Medication Sig Dispense Refill  . albuterol (VENTOLIN HFA) 108 (90 BASE) MCG/ACT inhaler Inhale 1 puff into the  lungs every 4 (four) hours as needed. For wheezing  1 Inhaler  6  . alendronate (FOSAMAX) 35 MG tablet Take 35 mg by mouth every 7 (seven) days. Take in the morning with a full glass of water, on an empty stomach, and do not take anything else by mouth or lie down for the next 30 min.      . calcium carbonate (OS-CAL) 600 MG TABS Take 600 mg by mouth 2 (two) times daily.        . cholecalciferol (VITAMIN D) 1000 UNITS tablet Take 2,000 Units by mouth daily.        . DULoxetine (CYMBALTA) 30 MG capsule Take 30 mg by mouth. Take 1 capsule in the morning an 2 at bedtime (Mental Health)      . fluticasone (FLONASE) 50 MCG/ACT nasal spray 2 sprays by Nasal route daily.  16 g  2  . Fluticasone-Salmeterol (ADVAIR) 250-50 MCG/DOSE AEPB Inhale 1 puff into the lungs 2 (two) times daily.  1 each  6  . gabapentin (NEURONTIN) 400 MG tablet Take 1 tablet (400 mg total) by mouth 2 (two) times daily.  60 tablet  1  . guaiFENesin (MUCINEX) 600 MG 12 hr tablet Take 1 tablet (600 mg total) by mouth 2 (two) times daily.  60 tablet  2  . HYDROcodone-acetaminophen (NORCO) 10-325 MG per tablet Take  1/2 to 1 tablet by mouth every 8 hours as needed. Given by Ernestina Penna faucette office- pain doctor      .  ipratropium (ATROVENT) 0.03 % nasal spray Place 2 sprays into the nose 3 (three) times daily.  30 mL  2  . lamoTRIgine (LAMICTAL) 100 MG tablet Take by mouth. 1 tablet in the morning and 2 tablets at night       . OLANZapine (ZYPREXA) 5 MG tablet Take 4 tablets (20 mg total) by mouth at bedtime.  30 tablet  3  . sodium chloride (OCEAN NASAL SPRAY) 0.65 % nasal spray 1 spray by Nasal route as needed for congestion.  45 mL  3  . traMADol (ULTRAM) 50 MG tablet Take 1 tablet (50 mg total) by mouth every 8 (eight) hours as needed for pain.  20 tablet  0  . zolpidem (AMBIEN) 5 MG tablet Take 1 tablet (5 mg total) by mouth at bedtime as needed. For sleep  30 tablet  1    Allergies: Review of patient's allergies indicates no  known allergies.  Past Medical History: Past Medical History  Diagnosis Date  . Depression   . Hypertension   . Suicidal ideation 2007     attempted overdose 2012  Dr. Gwendalyn Ege report  . Bipolar disorder   . Substance abuse      narcotics, alcohol, tobacco  . Active smoker     Past Surgical History: No past surgical history on file.  Family History: Family History  Problem Relation Age of Onset  . Stroke Neg Hx   . Cancer Neg Hx     Social History: History   Social History  . Marital Status: Single    Spouse Name: N/A    Number of Children: N/A  . Years of Education: N/A   Occupational History  . Not on file.   Social History Main Topics  . Smoking status: Current Everyday Smoker -- 0.3 packs/day    Types: Cigarettes  . Smokeless tobacco: Not on file  . Alcohol Use: No  . Drug Use: No  . Sexually Active: Not on file   Other Topics Concern  . Not on file   Social History Narrative  . No narrative on file    Review of Systems: Pertinent items are noted in HPI.  Vital Signs: T:  98.1  P:  82  BP:  114/66  RR:  20  O2 sat:  100% on 3 L nasal cannula    Physical Exam: General: Vital signs reviewed and noted. Well-developed, well-nourished, in no acute distress; alert, appropriate and cooperative throughout examination.  Head: Normocephalic, bruising noted in the right forehead and below right eye.  Eyes: PERRL, EOMI, No signs of anemia or jaundince.  Ears: TM nonerythematous, not bulging, good light reflex bilaterally.  Nose: Mucous membranes moist, not inflammed, nonerythematous.  Throat: Oropharynx nonerythematous, no exudate appreciated.   Neck: No deformities, masses, or tenderness noted.Supple, No carotid Bruits, no JVD.  Lungs:  Normal respiratory effort. Clear to auscultation BL without crackles or wheezes.  Heart: RRR. S1 and S2 normal without gallop, murmur, or rubs.  Abdomen:  BS normoactive. Soft, Nondistended, non-tender.  No masses or  organomegaly.  Extremities:  both right and left lower extremity bandaged up to the knees, left upper extremity bandaged up to elbow.  Neurologic: A&O X3, CN II - XII are grossly intact. Motor strength is 5/5 in the all 4 extremities, Sensations intact to light touch, Cerebellar signs negative.  Skin: No visible rashes, scars.   Lab results: CBC:    Component Value Date/Time   WBC 13.8* 10/26/2010 1125   HGB  13.3 10/26/2010 1143   HCT 39.0 10/26/2010 1143   PLT 374 10/26/2010 1125   MCV 93.6 10/26/2010 1125   NEUTROABS 12.2* 10/26/2010 1125   LYMPHSABS 0.7 10/26/2010 1125   MONOABS 0.8 10/26/2010 1125   EOSABS 0.0 10/26/2010 1125   BASOSABS 0.0 10/26/2010 1125      Comprehensive Metabolic Panel:    Component Value Date/Time   NA 137 10/26/2010 1143   K 4.0 10/26/2010 1143   CL 102 10/26/2010 1143   CO2 30 10/25/2010 1147   BUN 8 10/26/2010 1143   CREATININE 0.70 10/26/2010 1143   CREATININE 0.70 10/19/2010 1648   GLUCOSE 93 10/26/2010 1143   CALCIUM 8.6 10/25/2010 1147   AST 15 03/24/2010 0612   ALT 14 03/24/2010 0612   ALKPHOS 93 03/24/2010 0612   BILITOT 0.5 03/24/2010 0612   PROT 6.0 03/24/2010 0612   ALBUMIN 2.9* 03/24/2010 0612     Lab Results  Component Value Date   CKTOTAL 141 09/09/2010   CKMB 3.7 09/09/2010   TROPONINI <0.30 09/09/2010      Imaging results:  CXR 10/25/2010   Severe COPD/emphysema.  No acute cardiopulmonary disease.  Stable   chest x-ray.  CT Head and Neck  10/25/2010   No acute or traumatic finding.  Ordinary mild degenerative changes   as described. Negative head CT  Xray left ankle 10/25/2010   Avulsion fracture arising from the dorsal aspect of the distal   talus.  No other fractures.  Osteopenia.  Xray Right ankle 10/26/2010   Trimalleolar fracture / dislocation of the left ankle as discussed   Above.  Xray Right hand 10/26/2010   Mildly displaced fractures of the third and fourth metacarpal necks   with associated soft tissue  swelling.   Assessment & Plan:  Problem #1 multiple fractures following a fall: The patient will be taken acutely to the OR by Dr. Lajoyce Corners to fix left comminuted  ankle fracture. Patient currently to be kept on pain control with IV morphine and social work will be consulted to look for skilled nursing facility with ability to manage psychiatric patient's. The most likely reason for fall is medication induced gait abnormality. Despite several repeated recommendations to discontinue Klonopin and Ambien, patient continues to take these medications. We will communicate with patient's mental health clinic not to prescribe these medications for her anymore to avoid future incidence of fall. Patient does have osteopenia and is currently on vitamin D and calcium. Patient has Fosamax on the med list but compliance is questionable. Patient most likely will need to be on osteoporosis treatment following this fall. As per Dr Lajoyce Corners, she will be non weight bearing for atleast next 6 week.  Problem #2 psychiatric problems including bipolar disorder, depression and substance abuse: We will continue patient's home medications at this time. We'll check urine drug screen.   Problem #3 smoking: Nicotine patch 21 mg.   DVT PPX:  Lovenox 40 mg sq daily     (PGY1):  ____________________________________    Date/ Time:      ____________________________________     Lars Mage, M.D. (Senior resident):    ____________________________________    Date/ Time:      ____________________________________     I have seen and examined the patient. I reviewed the resident/fellow note and agree with the findings and plan of care as documented. My additions and revisions are included.   Signature:  ____________________________________________     Internal Medicine Teaching Service Attending    Date:  ____________________________________________      

## 2010-10-26 NOTE — Progress Notes (Signed)
Called by the ED physician to admit Ms [name] who came in today after a fall this morning. Patient had c/o pain in her arm and leg. While taking the history, patient refused to get admitted and demanded to be seen in the clinic instead. An appointment with Dr Thad Ranger was made for Wednesday at 10.15 am and patient was recommended to stop taking her Klonipin and [place]. Patient agreed with the recommendations and Dr Ignacia Palma in the ED discharged patient home with the above mentioned instructions.

## 2010-10-27 ENCOUNTER — Ambulatory Visit: Payer: Medicare Other | Admitting: Internal Medicine

## 2010-10-27 DIAGNOSIS — F3112 Bipolar disorder, current episode manic without psychotic features, moderate: Secondary | ICD-10-CM

## 2010-10-27 LAB — BASIC METABOLIC PANEL
CO2: 26 mEq/L (ref 19–32)
Calcium: 7 mg/dL — ABNORMAL LOW (ref 8.4–10.5)
Chloride: 104 mEq/L (ref 96–112)
Creatinine, Ser: <0.47 mg/dL — ABNORMAL LOW (ref 0.50–1.10)
Glucose, Bld: 77 mg/dL (ref 70–99)
Sodium: 139 mEq/L (ref 135–145)

## 2010-10-27 LAB — CBC
Platelets: 303 10*3/uL (ref 150–400)
RBC: 2.91 MIL/uL — ABNORMAL LOW (ref 3.87–5.11)
WBC: 11.9 10*3/uL — ABNORMAL HIGH (ref 4.0–10.5)

## 2010-10-27 LAB — VITAMIN D 25 HYDROXY (VIT D DEFICIENCY, FRACTURES): Vit D, 25-Hydroxy: 31 ng/mL (ref 30–89)

## 2010-10-27 LAB — PROTIME-INR: Prothrombin Time: 15.6 seconds — ABNORMAL HIGH (ref 11.6–15.2)

## 2010-10-27 LAB — TSH: TSH: 1.903 u[IU]/mL (ref 0.350–4.500)

## 2010-10-28 DIAGNOSIS — W19XXXA Unspecified fall, initial encounter: Secondary | ICD-10-CM

## 2010-10-28 DIAGNOSIS — S82899A Other fracture of unspecified lower leg, initial encounter for closed fracture: Secondary | ICD-10-CM

## 2010-10-28 LAB — BASIC METABOLIC PANEL
BUN: 5 mg/dL — ABNORMAL LOW (ref 6–23)
CO2: 27 mEq/L (ref 19–32)
Calcium: 7.8 mg/dL — ABNORMAL LOW (ref 8.4–10.5)
Chloride: 96 mEq/L (ref 96–112)
Creatinine, Ser: <0.47 mg/dL — ABNORMAL LOW (ref 0.50–1.10)

## 2010-10-28 LAB — CBC
HCT: 27.7 % — ABNORMAL LOW (ref 36.0–46.0)
MCH: 31.6 pg (ref 26.0–34.0)
MCV: 95.2 fL (ref 78.0–100.0)
Platelets: 257 10*3/uL (ref 150–400)
RDW: 14.3 % (ref 11.5–15.5)
WBC: 9.4 10*3/uL (ref 4.0–10.5)

## 2010-10-28 NOTE — Consult Note (Signed)
NAMESUNSHYNE, [name]                ACCOUNT NO.:  [id number]  MEDICAL RECORD NO.:  [id number]  LOCATION:  [facility]                         FACILITY:  [facility]  PHYSICIAN:  Eulogio Ditch, MD DATE OF BIRTH:  [birth year]  DATE OF CONSULTATION:  10/27/2010 DATE OF DISCHARGE:                                CONSULTATION   REASON FOR CONSULTATION:  Med adjustment, repeated falls.  HISTORY OF PRESENT ILLNESS:  A 60 year old Caucasian female who has a history of bipolar disorder, admitted on the medical floor because of a chief complaint of fall.  The patient told me that she was not told properly how to take her morphine, so she was taking morphine a lot, whenever it stopped working, she was taking it.  She denied any history of substance abuse like cocaine, marijuana, or alcohol.  She told me "I just take pain pills" and i dont abuse them.  The patient explained, "I just take medication when they do not work and Dr. Imogene Burn works on my medications, but I do not abuse them to get high.  The patient reported that she is following at [facility] for her bipolar medication in the outpatient setting.  She takes her Lamictal 200 mg at bedtime, Zyprexa 20 mg at bedtime, Cymbalta 60 mg at bedtime along with Neurontin 400 mg b.i.d.  The patient stated that this combination is working good for her and she feels much better.  The patient denied any history of psych hospitalization in the past  or any history of suicide attempts.  The patient reported that she lives by herself.  She has a home health aide who helps to get groceries and help with other stuff at home.  The patient's one sister lives in [place], son in [place], daughter in [place].  She stated that she has unlimited falls, so she is with them on the phone and has good support from them.  PAST MEDICAL HISTORY:  The patient has a history of sciatica and HTN.  ALLERGIES:  No known drug allergies.  MENTAL STATUS EXAM:  The patient  is calm, cooperative during interview. Fair eye contact.  No abnormal movements noticed.  Speech is normal in rate, rhythm, and volume.  No pressured speech.  No flight of ideas present.  She is logical and goal directed.  Not internally preoccupied, not hallucinating or delusional, not suicidal or homicidal.  Alert, awake, oriented x3.  Memory immediate, recent, and remote fair. Attention concentration fair.  Abstraction ability fair.  Insight and judgment fair.  DIAGNOSES:  AXIS I:  As per history, bipolar disorder type 1, history of substance abuse. AXIS II:  Deferred. AXIS III:  See medical notes. AXIS IV:  Chronic mental health and medical issues. AXIS V:  50.  RECOMMENDATIONS: 1. At this time, the patient can be continued on her current     medication regime. 2. I told the patient not to take Klonopin.  That would prevent her     frequent falls as she is on NO. OF MEDS. I also educated her to take her Zyprexa at bedtime,     not in the morning.  The patient was advised to remain compliant  with treatment in the outpatient setting.  Thanks for involving me in taking care of this patient.     Eulogio Ditch, MD     SA/MEDQ  D:  10/27/2010  T:  10/27/2010  Job:  [id number]  Electronically Signed by Eulogio Ditch  on 10/28/2010 09:04:37 AM

## 2010-10-29 DIAGNOSIS — F3112 Bipolar disorder, current episode manic without psychotic features, moderate: Secondary | ICD-10-CM

## 2010-10-29 LAB — PROTIME-INR: INR: 1.81 — ABNORMAL HIGH (ref 0.00–1.49)

## 2010-10-29 LAB — VITAMIN D 1,25 DIHYDROXY
Vitamin D2 1, 25 (OH)2: <8 pg/mL
Vitamin D3 1, 25 (OH)2: 21 pg/mL

## 2010-10-29 NOTE — Op Note (Signed)
NAMEGURTHA, PICKER                ACCOUNT NO.:  [id number]  MEDICAL RECORD NO.:  [id number]  LOCATION:  [facility]                         FACILITY:  [facility]  PHYSICIAN:  Nadara Mustard, MD     DATE OF BIRTH:  1950/12/14  DATE OF PROCEDURE:  10/26/2010 DATE OF DISCHARGE:                              OPERATIVE REPORT   PREOPERATIVE DIAGNOSIS:  Trimalleolar left ankle fracture-dislocation, closed.  POSTOPERATIVE DIAGNOSIS:  Trimalleolar left ankle fracture-dislocation, closed.  PROCEDURE:  Open reduction and internal fixation, trimalleolar ankle fracture.  SURGEON:  Nadara Mustard, MD  ANESTHESIA:  General.  ESTIMATED BLOOD LOSS:  Minimal.  ANTIBIOTICS:  One gram of Kefzol.  DRAINS:  None.  COMPLICATIONS:  None.  TOURNIQUET TIME:  None.  DISPOSITION:  To PACU in stable condition.  INDICATIONS FOR PROCEDURE:  The patient is a 60 year old woman with multiple psychiatric medical conditions who was seen last night in the emergency room with a talar avulsion fracture.  She was placed in a splint, was discharged home.  She then fell when she got home again and sustaining a significantly displaced fracture-dislocation of the left ankle.  She later ran overnight, was brought to the emergency room this morning.  Upon consultation through the emergency room, she was seen urgently.  She underwent conscious sedation in the emergency room for attempted closed reduction by the ER staff.  There was no reduction of the fracture dislocation and the patient was splinted with her ankle dislocated.  On examination, she had tenting of the skin with ischemic changes over the medial aspect of the ankle.  There was ecchymosis and bruising with the concern for possibly developing fracture blisters due to the significant length of time that the patient had the fracture dislocation.  She was brought urgently at this time for open reduction and internal fixation.  The patient does have a history of  tobacco abuse and discussed the patient is at risk for ischemic wounds due to the nature of the fracture dislocation with tenting of the skin as well as with the damage to the microcirculation with tobacco.  She is at risk for wound complications, wound not healing, potential for amputation. Other risks and benefits including infection, neurovascular injury, DVT, pulmonary embolus, need for additional surgery.  The patient states she understands and wished to proceed at this time.  DESCRIPTION OF PROCEDURE:  The patient was brought to OR room 5 and underwent a general anesthetic.  After adequate level of anesthesia was obtained, the patient's left lower extremity was prepped using DuraPrep and draped into sterile field.  A lateral incision was made over the fibula.  This was carried down to the fracture site.  There was significant comminution at the metaphyseal-diaphyseal junction of the fibular fracture.  The fibula was reduced out to length.  The Biomet locking plate was first locked distally.  This was used to further reduce the length of the fibula.  Using the compression screws, it was compressed proximally and multiple locking screws were placed to the distal fragment as well as compression screws proximally to stabilize the reduction.  C-arm fluoroscopy verified reduction.  Two cannulated screws, 44 mm in length, partially  threaded were placed medially.  The wound was irrigated with normal saline.  Subcu was closed using 2-0 Vicryl.  Skin was closed using approximating staples.  Radiographs both AP and lateral showed a congruent mortise and a congruent reduction in lateral view.  The wounds were covered with Adaptic, orthopedic sponges, ABD dressing, Webril, and Coban.  The patient was extubated and taken to PACU in stable condition.  Anticipate discharge to skilled nursing.     Nadara Mustard, MD     MVD/MEDQ  D:  10/26/2010  T:  10/27/2010  Job:   [id number]  Electronically Signed by Aldean Baker MD on 10/29/2010 06:21:01 AM

## 2010-11-11 ENCOUNTER — Inpatient Hospital Stay (HOSPITAL_COMMUNITY)
Admission: AD | Admit: 2010-11-11 | Discharge: 2010-11-16 | DRG: 493 | Disposition: A | Discharge status: Skilled Nursing Facility | Payer: Medicare Other | Source: Ambulatory Visit | Attending: Orthopedic Surgery | Admitting: Orthopedic Surgery

## 2010-11-11 DIAGNOSIS — J438 Other emphysema: Secondary | ICD-10-CM | POA: Diagnosis present

## 2010-11-11 DIAGNOSIS — Y831 Surgical operation with implant of artificial internal device as the cause of abnormal reaction of the patient, or of later complication, without mention of misadventure at the time of the procedure: Secondary | ICD-10-CM | POA: Diagnosis present

## 2010-11-11 DIAGNOSIS — G7109 Other specified muscular dystrophies: Secondary | ICD-10-CM | POA: Diagnosis present

## 2010-11-11 DIAGNOSIS — L02419 Cutaneous abscess of limb, unspecified: Secondary | ICD-10-CM | POA: Diagnosis present

## 2010-11-11 DIAGNOSIS — Z79899 Other long term (current) drug therapy: Secondary | ICD-10-CM

## 2010-11-11 DIAGNOSIS — T847XXA Infection and inflammatory reaction due to other internal orthopedic prosthetic devices, implants and grafts, initial encounter: Principal | ICD-10-CM | POA: Diagnosis present

## 2010-11-11 DIAGNOSIS — I10 Essential (primary) hypertension: Secondary | ICD-10-CM | POA: Diagnosis present

## 2010-11-11 DIAGNOSIS — M5430 Sciatica, unspecified side: Secondary | ICD-10-CM | POA: Diagnosis present

## 2010-11-11 DIAGNOSIS — F319 Bipolar disorder, unspecified: Secondary | ICD-10-CM | POA: Diagnosis present

## 2010-11-11 LAB — COMPREHENSIVE METABOLIC PANEL
ALT: 9 U/L (ref 0–35)
AST: 10 U/L (ref 0–37)
Albumin: 3 g/dL — ABNORMAL LOW (ref 3.5–5.2)
CO2: 30 mEq/L (ref 19–32)
Calcium: 8.8 mg/dL (ref 8.4–10.5)
Chloride: 101 mEq/L (ref 96–112)
GFR calc non Af Amer: >60 mL/min (ref >60)
Sodium: 142 mEq/L (ref 135–145)
Total Bilirubin: 0.2 mg/dL — ABNORMAL LOW (ref 0.3–1.2)

## 2010-11-11 LAB — DIFFERENTIAL
Basophils Relative: 1 % (ref 0–1)
Eosinophils Relative: 3 % (ref 0–5)
Lymphocytes Relative: 33 % (ref 12–46)
Monocytes Relative: 9 % (ref 3–12)
Neutrophils Relative %: 54 % (ref 43–77)
Smear Review: INCREASED

## 2010-11-11 LAB — CBC
HCT: 31.9 % — ABNORMAL LOW (ref 36.0–46.0)
Hemoglobin: 10.6 g/dL — ABNORMAL LOW (ref 12.0–15.0)
WBC: 8.1 10*3/uL (ref 4.0–10.5)

## 2010-11-11 LAB — PROTIME-INR: INR: 1.05 (ref 0.00–1.49)

## 2010-11-13 LAB — PROTIME-INR: Prothrombin Time: 14.6 seconds (ref 11.6–15.2)

## 2010-11-14 LAB — PROTIME-INR
INR: 1.18 (ref 0.00–1.49)
Prothrombin Time: 15.3 seconds — ABNORMAL HIGH (ref 11.6–15.2)

## 2010-11-15 LAB — PROTIME-INR: Prothrombin Time: 15.9 seconds — ABNORMAL HIGH (ref 11.6–15.2)

## 2010-11-15 NOTE — Op Note (Signed)
  [name], [name]                ACCOUNT NO.:  [id number]  MEDICAL RECORD NO.:  [id number]  LOCATION:  5021                         FACILITY:  [facility]  PHYSICIAN:  Nadara Mustard, MD     DATE OF BIRTH:  1950-06-28  DATE OF PROCEDURE:  11/12/2010 DATE OF DISCHARGE:                              OPERATIVE REPORT   PREOPERATIVE DIAGNOSIS:  Infected left ankle status post open reduction and internal fixation trimalleolar fracture dislocation.  POSTOPERATIVE DIAGNOSIS:  Infected left ankle status post open reduction and internal fixation trimalleolar fracture dislocation.  PROCEDURES: 1. Removal of deep retained hardware. 2. Debridement of skin, soft tissue and bone and excision of necrotic     wound edges. 3. Placement of antibiotic beads with vancomycin 1 g and gentamicin     160 mg.  SURGEON:  Nadara Mustard, MD  ANESTHESIA:  General.  ESTIMATED BLOOD LOSS:  Minimal.  ANTIBIOTICS:  Vanc and Zosyn preoperatively.  DRAINS:  None.  COMPLICATIONS:  None.  TOURNIQUET TIME:  None.  DISPOSITION:  To PACU in stable condition.  INDICATIONS OF PROCEDURE:  The patient is a 60 year old woman status post trimalleolar fracture dislocation who developed an infection in the left ankle.  She presents at this time for removal of deep retained hardware, placement of antibiotic beads.  Risks and benefits of surgery were discussed including persistent infection, neurovascular injury, nonhealing of the wounds, need for additional surgery.  The patient states she understands and wished to proceed at this time.  DESCRIPTION OF PROCEDURE:  The patient was brought to OR room 1 and underwent a general anesthetic after adequate level of anesthesia obtained.  The patient's left lower extremity was prepped using DuraPrep and draped into a sterile field.  The medial and lateral incisions were ellipsed out to ellipse out the necrotic tissue.  There was infection deep within the hardware.  The  hardware and screws were removed medially and laterally.  This was irrigated with pulsatile lavage.  Skin, soft tissue, muscle and bone were debrided.  Antibiotic beads were mixed on the back table with the stimulant beads 1 g of vanc, 160 mg of gentamicin.  These were placed medially and laterally.  The incision was closed using a far-near, near-far suture technique with 2-0 nylon.  The wounds were covered with Adaptic orthopedic sponges, ABD, Webril and Coban and a sugar-tong splint.  The patient was extubated, taken to PACU in stable condition.  Plan to discharge skilled nursing on Tuesday.     Nadara Mustard, MD     MVD/MEDQ  D:  11/12/2010  T:  11/12/2010  Job:  [id number]  Electronically Signed by Aldean Baker MD on 11/15/2010 06:44:42 AM

## 2010-11-16 LAB — PROTIME-INR: INR: 1.47 (ref 0.00–1.49)

## 2010-11-16 NOTE — Discharge Summary (Signed)
[name], [name]                ACCOUNT NO.:  [id number]  MEDICAL RECORD NO.:  [id number]  LOCATION:  [facility]                         FACILITY:  [facility]  PHYSICIAN:  Blanch Media, M.D.DATE OF BIRTH:  [birth year]  DATE OF ADMISSION:  10/26/2010 DATE OF DISCHARGE:  10/29/2010                        DISCHARGE SUMMARY - REFERRING   DISCHARGE DIAGNOSES: 1. Multiple fractures:  Left ankle trimalleolar fracture dislocation,     right ankle avulsion fracture of the dorsal distal talus, left hand     fractures of the third and fourth metacarpals. 2. Bipolar disorder and depression.  DISCHARGE MEDICATIONS: 1. Calcium carbonate 500 mg by mouth daily. 2. Cholecalciferol vitamin D3, 1000 units take 4 tablets (4000 units)     by mouth daily. 3. Docusate sodium 100 mg capsules take 100 mg by mouth twice daily. 4. Gabapentin 400 mg take one by mouth twice daily. 5. Lamotrigine 100 mg take one by mouth every morning and two by mouth     every night daily. 6. Nicotine 21 mg/24-hour patch NicoDerm CQ transdermally daily. 7. Oxycodone/acetaminophen 5/325 mg take every 6 hours by mouth as     needed for pain. 8. Ventolin inhaler 1-2 puffs by mouth q.2 h. p.r.n., 1-2 puffs by     mouth for emergency shortness of breath only. 9. Warfarin 1 mg by mouth daily. 10.Advair Diskus 2 puffs inhaled twice daily. 11.Duloxetine 30 mg tablets take 1 tablet every morning and 2 tablets     everyday at bedtime. 12.Guaifenesin XR 600 mg 1 tablet by mouth twice daily. 13.Olanzapine 20 mg 1 tablet by mouth daily at bedtime.  DISPOSITION:  The patient is to follow up in the outpatient clinic at [facility] in 2 to 4 weeks.  This appointment will be scheduled for the patient.  The patient is also to follow up with Orthopedics with Dr. Lajoyce Corners.  This appointment will be scheduled and the patient will be notified. The patient is being discharged to the [facility] in [facility] for  rehabilitation given her fractures in 3 extremities. She is to be no weightbearing for 6 weeks on the left lower extremity, which is to remain in the boot.  She may be weightbearing as tolerated in the boot on her right lower extremity.  She also may be weightbearing as tolerated on her left hand.  PROCEDURES:  October 25, 2010, chest x-ray:  Severe COPD emphysema.  No acute cardiopulmonary disease.  Stable chest x-ray. October 26, 2010, portable chest x-ray:  Emphysema without acute cardiopulmonary disease.  No pneumothorax or displaced rib fractures. CT of head without contrast:  Negative head CT. CT of cervical spine without contrast:  No acute or traumatic findings. Ordinary mild degenerative changes including ordinary degenerative change between the anterior arch of C1 and dens, but no slippage or encroachment.  There is mild spondylosis at C4-C5, C5-C6, and C6-C7 with small uncovertebral osteophytes.  Lung x-ray showed advanced emphysema. Right foot x-ray:  Avulsion fracture rises from the distal to dorsal aspect of the distal talus.  No other fractures.  Osteopenia. Left foot, ankle film:  Trimalleolar fracture/dislocation of the left ankle.  There is lateral dislocation of the talar with  respect to the distal tibia.  The fracture of the medial malleolus extends through the plafond.  There is marked lateral displacement of proximal retraction of the distal left fibular fragment. Left hand x-ray:  Mildly displaced fracture of the third and fourth metacarpal neck with associated soft tissue swelling.  ADMITTING HISTORY AND PHYSICAL:  The patient is a 60 year old female with a past medical history of depression, bipolar disorder, substance abuse, and COPD who presents with a chief complaint of fall.  The patient presented to the emergency room yesterday with similar complaints of ankle fracture of her right foot, but left against medical advice.  The patient states she went home, was  out in her patio smoking a cigarette, when she turned to get back in her house she fell.  This happened at 21:30 last night.  She states she woke up this morning with left ankle deformity and left hand pain.  The patient denied loss of consciousness, dizziness, or aura after the fall, loss of bladder or bowel control, palpitations, headache, or seizure activity.  She states that her legs felt weak.  The fall was not witnessed.  She had a small bleeding abrasion on her right lower forehead/nose bridge.  She stated that she had decreased her Klonopin and Ambien as recommended on her evening dose yesterday, although she actually was told to stop taking this medication twice previously including yesterday in the emergency department.  The patient has had multiple episodes of fall in the last several months including on admission in July 2012.  ADMITTING PHYSICAL EXAMINATION:  VITALS:  Temperature 98.1, pulse 82, blood pressure 114/66, respirations 20, O2 saturation of 100% on 3 L oxygen. GENERAL: Well-developed, well-nourished, in no acute distress.  Alert, appropriate, cooperative throughout the examination. HEAD:  Normocephalic, bruising noted on the right forehead/nose bridge as well as below the right eye. EYES:  Pupils equally round and reactive to light.  Extraocular motion intact.  No signs of anemia or jaundice. EARS:  TM nonerythematous.  Not bulging.  Good light reflex bilaterally. NOSE:  Mucous membranes moist, noninflamed, nonerythematous. THROAT:  Oropharynx nonerythematous.  No exudate appreciated. NECK:  No deformities, masses, or tenderness noted.  Supple.  No carotid bruits.  No JVD. LUNGS:  Normal respiratory effort.  Clear to auscultation bilaterally without crackles or wheezes. HEART:  Regular rate and rhythm.  S1 and S2 normal without gallop, murmur, or rubs. ABDOMEN:  Bowel sounds normoactive, soft, nondistended, nontender.  No masses or organomegaly. EXTREMITIES:   Both upper and lower extremities bandaged up to the knees. Left upper extremity bandaged up to the elbow. NEUROLOGIC:  Alert and oriented x3.  Cranial nerves II through XII grossly intact.  Motor strength is 5/5 in all four extremities. Sensation intact to light touch.  Cerebellar signs negative. SKIN:  No visible rashes or scars.  ADMISSION LABS:  CBC:  White blood cell is 13.8, hematocrit 39.0, hemoglobin 13.3, platelets 374, absolute neutrophils 12.2. BMET:  Sodium 137, potassium 4.0, chloride 102, bicarb 30, BUN 8, creatinine 0.70, glucose 93, calcium 8.6. Cardiac enzymes:  CK total 141, CK-MB 3.7, troponin less than 0.30.  HOSPITAL COURSE: 1. Fractures.  The patient's left ankle trimalleolar fracture was     repaired by Dr. Lajoyce Corners in Orthopedics on the day of admission.     Postoperatively, she was treated for pain initially with IV     morphine, which was subsequently converted to p.o. Percocet during     this hospitalization.  She did not  have bowel movement for the     first couple of days after surgery, so laxatives were given.  She     is being discharged on a stool softener twice daily as long as she     is taking narcotics for pain control.  She is to be     nonweightbearing on the left ankle for six weeks, but she may bear     weight as tolerated in the right ankle as long as she is wearing     the boot on that ankle.  She is to wear the boot in the left ankle     as well for protection.  She is also weightbearing as tolerated on     her left hand.  The right ankle and the left hand were not operated     on during this hospitalization. 2. Bipolar disorder/depression:  The patient's home medication regimen     was continued during hospitalization.  Psychiatric consult was     sought and the consult agreed with continuing her home medications     as she had been taking them.  Klonopin and Ambien were discontinued     on admission, but these had been previously discontinued.   She had     just been taking these against medical recommendations.  DISCHARGED VITAL SIGNS AND LAB RESULTS:  Vital signs on discharge: Temperature 98.8, pulse 98, respirations 18, blood pressure 92/58, O2 saturation 94% on 2 L of oxygen. BMET from October 28, 2010:  Sodium 142, potassium 3.6, chloride 96, bicarb 28, glucose 100, BUN 5, creatinine less than 0.47. CBC from October 28, 2010:  White blood cells 9.4, hemoglobin 9.2, hematocrit 27.7, platelets 257. INR 1.81.    ______________________________ Blanca Friend, MD   ______________________________ Blanch Media, M.D.    BW/MEDQ  D:  10/29/2010  T:  10/29/2010  Job:  [id number]  Electronically Signed by Blanca Friend MD on 11/05/2010 10:55:00 PM Electronically Signed by Blanch Media M.D. on 11/16/2010 04:38:14 PM

## 2010-11-30 ENCOUNTER — Encounter: Payer: Medicare Other | Admitting: Internal Medicine

## 2010-12-02 NOTE — Discharge Summary (Signed)
  [name], [name]                ACCOUNT NO.:  [id number]  MEDICAL RECORD NO.:  [id number]  LOCATION:  5021                         FACILITY:  [facility]  PHYSICIAN:  Nadara Mustard, MD     DATE OF BIRTH:  [birth year]  DATE OF ADMISSION:  11/11/2010 DATE OF DISCHARGE:  11/16/2010                              DISCHARGE SUMMARY   FINAL DIAGNOSIS:  Infected left ankle, status post open reduction and internal fixation with deep retained hardware.  SURGICAL PROCEDURES: 1. Removal of deep retained hardware. 2. Debridement of skin, soft tissue, and bone and excision of necrotic     wound edges. 3. Placement of antibiotic beads with vancomycin 1 g and gentamicin     160 mg.  Discharged to skilled nursing in stable condition.  IV antibiotics during the hospital stay were vancomycin and Zosyn.  DISCHARGE MEDICATIONS:  Her admission medications per the medical reconciliation form plus Tylox 1 p.o. q.4 h. p.r.n. for pain and doxycycline 100 mg p.o. b.i.d. for 1 month.  Discharged to skilled nursing in stable condition.  No wound dressing changes to the left ankle.  Physical therapy, progressive ambulation, nonweightbearing on the left.  The patient is to keep her left leg elevated at all times with her foot level with the heart.  Follow up in the office in 1 week.     Nadara Mustard, MD     MVD/MEDQ  D:  11/16/2010  T:  11/16/2010  Job:  [id number]  Electronically Signed by Aldean Baker MD on 12/02/2010 06:23:15 AM

## 2010-12-14 NOTE — Progress Notes (Signed)
Addended by: Neomia Dear on: 12/14/2010 05:03 PM   Modules accepted: Orders

## 2010-12-23 ENCOUNTER — Other Ambulatory Visit: Payer: Self-pay | Admitting: *Deleted

## 2010-12-23 DIAGNOSIS — F329 Major depressive disorder, single episode, unspecified: Secondary | ICD-10-CM

## 2010-12-23 NOTE — Telephone Encounter (Signed)
Also clarify Cymbalta instruction.

## 2010-12-23 NOTE — Telephone Encounter (Signed)
Original rx was 4 tablets daily but qty only #30.

## 2010-12-28 MED ORDER — DULOXETINE HCL 30 MG PO CPEP: ORAL_CAPSULE | ORAL | Status: DC

## 2010-12-28 NOTE — Telephone Encounter (Signed)
The cymbalta is to be taken as follows 30 mg (1 capsule) by mouth in the morning and 60 mg (2 capsules) at bedtime.  In Dr. Christie Nottingham last clinic note she documented that the olanzapine was decreased to 5 mg PO QHS at bedtime during a June admission because of "polypharmacy".  This apparently was not corrected in the medication section of the EMR.  I will therefore ask Dr. Anselm Jungling to clarify the dosing and quantity of the olanzapine since there is conflicting documentation.

## 2011-01-03 ENCOUNTER — Other Ambulatory Visit: Payer: Self-pay | Admitting: Internal Medicine

## 2011-01-03 MED ORDER — OLANZAPINE 5 MG PO TABS: 5.0000 mg | ORAL_TABLET | Freq: Every day | ORAL | Status: DC

## 2011-03-01 ENCOUNTER — Ambulatory Visit (INDEPENDENT_AMBULATORY_CARE_PROVIDER_SITE_OTHER): Payer: Medicare Other | Admitting: Internal Medicine

## 2011-03-01 ENCOUNTER — Encounter: Payer: Self-pay | Admitting: Internal Medicine

## 2011-03-01 VITALS — BP 105/59 | HR 98 | Temp 99.5°F | Ht 61.0 in | Wt 97.9 lb

## 2011-03-01 DIAGNOSIS — J449 Chronic obstructive pulmonary disease, unspecified: Secondary | ICD-10-CM

## 2011-03-01 DIAGNOSIS — J439 Emphysema, unspecified: Secondary | ICD-10-CM | POA: Insufficient documentation

## 2011-03-01 DIAGNOSIS — M79605 Pain in left leg: Secondary | ICD-10-CM | POA: Insufficient documentation

## 2011-03-01 DIAGNOSIS — Z8659 Personal history of other mental and behavioral disorders: Secondary | ICD-10-CM

## 2011-03-01 DIAGNOSIS — Z23 Encounter for immunization: Secondary | ICD-10-CM

## 2011-03-01 DIAGNOSIS — M79609 Pain in unspecified limb: Secondary | ICD-10-CM

## 2011-03-01 MED ORDER — TRAMADOL HCL 50 MG PO TABS: 50.0000 mg | ORAL_TABLET | Freq: Three times a day (TID) | ORAL | Status: DC | PRN

## 2011-03-01 MED ORDER — NICOTINE 21 MG/24HR TD PT24: 1.0000 | MEDICATED_PATCH | TRANSDERMAL | Status: DC

## 2011-03-01 NOTE — Assessment & Plan Note (Signed)
Stable.  Will continue current medications as prescribed by mental health.  Patient has an appointment to see mental health tomorrow!

## 2011-03-01 NOTE — Progress Notes (Signed)
HPI: [name] is a 60 yo woman with PMH of bipolar disorder, depression, back pain, hx of narcotic abuse, COPD who had a fall in August with multiple fractures of her left ankle as well as her left hand.  She was subsequently sent to rehab for 2 months and came home right before Thanksgiving.  She has been wearing the CAM boot and was prescribed Tramadol for pain control by Dr. Lajoyce Corners, but she states that she has not had any pain med in the past 2 months.  She is requesting for some pain med because her sciatica pain on the left leg is "acting up" and pain radiates down her left leg and that she is not able to get into pain clinic until early January.  She denies any lightheadedness, drowsiness or lethargy since she came home and has not had any other falls since the one in August.  As for her mood, she is stable and denies any manic episodes.  She is taking the medications as prescribed.  She would like to have a prescription for Nicotine Patch which had really help her quit smoking when she was at rehab.  After discharge from rehab, she resumed smoking because she does not have anymore Nicotine patch.  She also states that she is looking at assisted living place so that she can be closer to her sister and that would also be safer for patient as well. She is also scheduled to see mental health tomorrow.  No other complaints today. She denies any shortness of breath, chest pain, fever or chills. Of note, she states that she has been on oral antibiotics in the past 3 weeks twice daily for her infected left ankle but she does not know the name of the medication.  ROS: as per HPI  PE: General: alert, thin appearing walking with a walker, and cooperative to examination. .  Lungs: normal respiratory effort, no accessory muscle use, normal breath sounds, no crackles, and no wheezes. Heart: normal rate, regular rhythm, no murmur, no gallop, and no rub.  Abdomen: soft, non-tender, normal bowel sounds, no distention,  no guarding, no rebound tenderness, no hepatomegaly, and no splenomegaly.  Pulses: 2+ DP/PT pulses bilaterally Extremities: No cyanosis, clubbing, edema.  Left Knee crepitation on passive ROM, + straight leg raising, wearing CAM boot.  Right LE: wnl Neurologic: alert & oriented X3, cranial nerves II-XII intact, strength normal in all extremities, sensation intact to light touch, and walking with CAM boot Skin: turgor normal and no rashes.  Psych: Oriented X3, memory intact for recent and remote, normally interactive, good eye contact,+ slightly anxious appearing, and not depressed appearing.

## 2011-03-01 NOTE — Assessment & Plan Note (Addendum)
Likely sciatica vs lower back pain with radiculopathy.  Patient is being managed by pain clinic given her high risk of overdose as well as the sedation from psych medications.  I am very cautious with her pain medication especially narcotics given her history of altered mental status, falls, and overdose.  We ran the narcotic database on her and she only had 1 Rx filled in August by pain clinic which is consistent with her story.  She has not had any pain med in the past few months.  She is planning to see pain clinic in early January.  In the mean time, I will give a short course of Tramadol 50mg  po q8hr PRN #20 with NO refill.  As for her left ankle fracture, she is being managed by Dr. Lajoyce Corners.   -Will also refer to social work to help assist with finding an assisted living facility for patient's safety given her recurrent falls. Case discussed with attending, Dr. Phillips Odor.

## 2011-03-01 NOTE — Patient Instructions (Signed)
Take Advair 2 puffs daily, not on an as needed basis Stop smoking and resume Nicotine Patch daily You can take Tramadol 50mg  one tablet every 8 hours as needed for pain Follow up with Dr. Anselm Jungling in 4 months or sooner if needed

## 2011-03-01 NOTE — Assessment & Plan Note (Signed)
Stable.  No SOB or wheezing, fever or chills.  She has been using ventolin and Advair on an as needed basis.  I instructed patient to use Advair on a daily basis and Ventolin PRN.

## 2011-03-16 ENCOUNTER — Telehealth: Payer: Self-pay | Admitting: Licensed Clinical Social Worker

## 2011-03-16 ENCOUNTER — Other Ambulatory Visit: Payer: Self-pay | Admitting: *Deleted

## 2011-03-16 NOTE — Telephone Encounter (Signed)
Spoke w/ patient today about assisted living.  Explained how it is financed through [employer] and [employer].  Patient interested in moving to [facility] where her sister lives.  She is not so sure she wants to give up most of her check to go into AL.  The patient now has an aide 7 days a week for a total of 15 hours a week and said that she feels this is enough.  She will call me if she wants to pursue AL further and I also told her she could go to DSS for further information.

## 2011-03-16 NOTE — Telephone Encounter (Signed)
Call pt when ready [phone]

## 2011-03-21 ENCOUNTER — Other Ambulatory Visit: Payer: Self-pay | Admitting: *Deleted

## 2011-06-06 ENCOUNTER — Telehealth: Payer: Self-pay | Admitting: *Deleted

## 2011-06-06 NOTE — Telephone Encounter (Signed)
Amy rn calls and states pt dismissed self from the services of preferred pain management today after calling and requesting narcotics and being told by deanna faucette pa that narcotics were not an option based on pt's hx. Amy states pt has been calling several times weekly to request refills on tramadol and the addition of narcotics, today her behavior was addressed by pa and pt dismissed self from clinic. If you would like to speak with the pa the ph# [phone]

## 2011-06-08 NOTE — Telephone Encounter (Signed)
Pt calls for a referral to a different pain management clinic for narcotics, appt 06/09/2011 dr Anselm Jungling for referral

## 2011-06-09 ENCOUNTER — Ambulatory Visit (INDEPENDENT_AMBULATORY_CARE_PROVIDER_SITE_OTHER): Payer: Medicare Other | Admitting: Internal Medicine

## 2011-06-09 ENCOUNTER — Encounter: Payer: Self-pay | Admitting: Internal Medicine

## 2011-06-09 VITALS — BP 122/72 | HR 103 | Temp 99.1°F | Wt 87.3 lb

## 2011-06-09 DIAGNOSIS — F329 Major depressive disorder, single episode, unspecified: Secondary | ICD-10-CM

## 2011-06-09 DIAGNOSIS — M545 Low back pain: Secondary | ICD-10-CM

## 2011-06-09 DIAGNOSIS — J449 Chronic obstructive pulmonary disease, unspecified: Secondary | ICD-10-CM

## 2011-06-09 DIAGNOSIS — Z8659 Personal history of other mental and behavioral disorders: Secondary | ICD-10-CM

## 2011-06-09 MED ORDER — VITAMIN D 1000 UNITS PO TABS: 1000.0000 [IU] | ORAL_TABLET | Freq: Every day | ORAL | Status: DC

## 2011-06-09 MED ORDER — LIDOCAINE 5 % EX PTCH: 1.0000 | MEDICATED_PATCH | CUTANEOUS | Status: DC

## 2011-06-09 MED ORDER — CALCIUM CARBONATE 600 MG PO TABS: 600.0000 mg | ORAL_TABLET | Freq: Two times a day (BID) | ORAL | Status: DC

## 2011-06-09 MED ORDER — ALBUTEROL SULFATE HFA 108 (90 BASE) MCG/ACT IN AERS: 1.0000 | INHALATION_SPRAY | RESPIRATORY_TRACT | Status: DC | PRN

## 2011-06-09 MED ORDER — FLUTICASONE-SALMETEROL 250-50 MCG/DOSE IN AEPB: 1.0000 | INHALATION_SPRAY | Freq: Two times a day (BID) | RESPIRATORY_TRACT | Status: DC

## 2011-06-09 MED ORDER — ALENDRONATE SODIUM 35 MG PO TABS: 35.0000 mg | ORAL_TABLET | ORAL | Status: DC

## 2011-06-09 NOTE — Assessment & Plan Note (Signed)
Stable. Patient denies any short of breath. On lung exam, lungs were clear to auscultation bilaterally, no wheezes or crackles. Patient ran out of her inhalers.  I instructed patient to continue using the Advair twice a day and albuterol when necessary. -Refill Advair and albuterol inhalers

## 2011-06-09 NOTE — Progress Notes (Signed)
History of present illness: [name] is a 61 yo woman with PMH of depression, osteoporosis, narcotic abuse, bipolar affective disorder, COPD presents today for followup. She states that she continues to have sharp/throbbing lower back pain and left ankle pain, 8/10 in severity, without any radiation. Patient recently had a fall in which she broke her left ankle, wrists and was being managed by orthopedics Dr. Lajoyce Corners. Given her history of intentional overdose of medication especially narcotics, the patient has been seen by pain management for her pain medication. Patient actually dismissed herself from the current pain clinic because they refused to give her stronger medication. She states that tremor.has not really worked for her and would like to be referred to a new pain clinic. She has lost some weight due to poor appetite because of her pain. She reports that she has not been fallen nor feel drowsy at home.  She has not moved into an assisted living as of yet because of her medical insurance but is still planning on moving to an assisted living in [place] so she can be close to her sister.  She reports that her breathing is much better and that she does not use her inhalers at home after she ran out of the medication. She has decreased her smoking from 3 packs per day to 3 cigarettes per day now.  As for her left ankle, there is an indurated area at the end of the healed scar and she wants to know if she needs to be evaluated by orthopedics . No other complaints today.   Review of system: As per history of present illness  Physical examination: General: Pleasant, alert, thin appearing, and cooperative to examination.  Lungs: normal respiratory effort, no accessory muscle use, normal breath sounds, no crackles, and no wheezes. Heart: normal rate, regular rhythm, no murmur, no gallop, and no rub.  Abdomen: soft, non-tender, normal bowel sounds, no distention, no guarding, no rebound tenderness Pulses:  2+ DP/PT pulses bilaterally Extremities: Left lower extremity: Healed 10 cm scar on the left ankle laterally at the proximal end of the scar, there is an indurated/calcified nodular area without any tenderness, drainage, erythema or swelling.  Neurologic: alert & oriented X3, cranial nerves II-XII intact, strength normal in all extremities, sensation intact to light touch, and walking with a rolling walker  Psych: Oriented X3, memory intact for recent and remote, normally interactive, good eye contact, ++anxious appearing, and not depressed appearing.

## 2011-06-09 NOTE — Assessment & Plan Note (Signed)
Stable mood.  Continue current meds.  Being managed by mental health

## 2011-06-09 NOTE — Assessment & Plan Note (Addendum)
Patient has chronic back pain which is likely due to DJD.  Patient has been seen by several clinics and dismiss herself from the most recent clinic because they refused to give her stronger narcotics and that tramadol does not work for her. She does have a history of overdose intentionally and a history of multiple falls in the past which is very concerning. I explained to her that patient I am concerned about her safety and does not want to do any harm by prescribing her narcotics.  She verbalized her understanding and agreed to try a Lidocaine patch. -I prescribed lidocaine patch 5% daily#30 -Per patient's request, will refer her to a new pain clinic

## 2011-06-09 NOTE — Patient Instructions (Signed)
You can apply Lidocaine Patch every 24 hours as needed for pain We will refer you to a new pain clinic Follow up with Dr. Anselm Jungling in 3 months

## 2011-06-09 NOTE — Assessment & Plan Note (Signed)
Stable, will continue current regimen 

## 2011-06-14 ENCOUNTER — Telehealth: Payer: Self-pay | Admitting: *Deleted

## 2011-06-14 NOTE — Telephone Encounter (Signed)
Call from pt. Pt states lidocaine patches are not helping; requesting something stronger for the pain. Please advise.  Thanks

## 2011-06-14 NOTE — Telephone Encounter (Signed)
Tell her it will take time.  We are not able to prescribe stronger pain meds due to her history of MULTIPLE falls and overdose.  We are waiting to get her in with another pain clinic so they can better manage her. Thanks

## 2011-06-15 NOTE — Telephone Encounter (Signed)
Pt was called and informed per Dr Christie Nottingham instruction. Pt stated"something has to be done".  She is not eating nor sleeping; and stated she will lie down If she gets dizzy.

## 2011-06-16 ENCOUNTER — Other Ambulatory Visit: Payer: Self-pay | Admitting: *Deleted

## 2011-06-16 NOTE — Telephone Encounter (Signed)
Pt states the patches are working very well, she now needs step 2 of the patches

## 2011-06-16 NOTE — Telephone Encounter (Signed)
[name], have we found new clinic for [name] yet? Thanks

## 2011-06-18 MED ORDER — NICOTINE 21 MG/24HR TD PT24: 1.0000 | MEDICATED_PATCH | TRANSDERMAL | Status: AC

## 2011-06-28 ENCOUNTER — Telehealth: Payer: Self-pay | Admitting: *Deleted

## 2011-06-28 NOTE — Telephone Encounter (Signed)
Pt called stating she was seen 2 weeks ago for lumbar back pain with radiation to right leg. She was given tramadol and lidoderm patches with no relief. Can you order something else?  She states she has NOT been falling and morphine used to work well for her. Also she thought you were looking for another pain management doctor for her.  Call pt at [phone]

## 2011-06-29 NOTE — Telephone Encounter (Signed)
Paged Dr Anselm Jungling for answer.

## 2011-06-29 NOTE — Telephone Encounter (Signed)
Pt called in again states that tramadol is not helping, she is not sleeping.  She is in a lot of throbbing  pain with both legs from hip to toes.  Rates pain 8/10

## 2011-06-30 ENCOUNTER — Other Ambulatory Visit: Payer: Self-pay | Admitting: *Deleted

## 2011-06-30 NOTE — Telephone Encounter (Signed)
I talked with Dr Anselm Jungling and she only wants pt to use meds given at last visit. Lidoderm patches and tramadol. Pt informed. Purnell Shoemaker is working on finding another pain clinic for pt.  She talked with Kennon Rounds and told her that she is working on it but it has been hard to find one that will accept pt.

## 2011-06-30 NOTE — Telephone Encounter (Signed)
Tramadol was being prescribed by her previous pain clinic that is why it is on her list; however, I am not prescribing any pain meds for her beside the Lidoderm Patch given her history of overdose especially on narcotics.  Patient will need to follow up with a new pain clinic. Dr. Rogelia Boga, do you have any other suggestions to what else I can do for [name]?

## 2011-06-30 NOTE — Telephone Encounter (Signed)
I vaguely remember her from a previous admission. It seems that it is well documented by several prescibers that she is not able to safely use opioids and that the [facility] has made the decision that we cannot prescribe them to her. Unless there is a sig change in her home life / supervision / etc, we need to continue with that policy. If you feel strongly that pt needs opioids, then you can look into increased supervision - could [facility] play a role in supplying her with limited quantity of opioids, could they go to her house and put on a fentanyl patch q 3 days but not leave the extra patches at her house? Otherwise, cont to see if there is a pain clinic that will accept her although it seems doubtful.

## 2011-06-30 NOTE — Telephone Encounter (Signed)
Pt called again and asked if you would increase the dose of tramadol. Rx says take one tab twice a day.  That is not helping so she would like the dose increased.

## 2011-07-04 ENCOUNTER — Other Ambulatory Visit: Payer: Self-pay | Admitting: Internal Medicine

## 2011-07-04 MED ORDER — LIDOCAINE 5 % EX PTCH: 1.0000 | MEDICATED_PATCH | CUTANEOUS | Status: AC

## 2011-07-18 ENCOUNTER — Other Ambulatory Visit: Payer: Self-pay | Admitting: Internal Medicine

## 2011-08-10 ENCOUNTER — Telehealth: Payer: Self-pay | Admitting: *Deleted

## 2011-08-10 NOTE — Telephone Encounter (Signed)
Pt request refill on lidoderm 5%  patch  Last time given was 07/04/11

## 2011-08-11 NOTE — Telephone Encounter (Signed)
i thought she said it wasn't working.  Plus she is being prescribed narcotics at the pain center. Will not refill Lidoderm

## 2011-08-11 NOTE — Telephone Encounter (Signed)
Pt informed and voices understanding 

## 2011-09-09 ENCOUNTER — Other Ambulatory Visit: Payer: Self-pay | Admitting: *Deleted

## 2011-09-11 MED ORDER — FLUTICASONE-SALMETEROL 250-50 MCG/DOSE IN AEPB: 1.0000 | INHALATION_SPRAY | Freq: Two times a day (BID) | RESPIRATORY_TRACT | Status: DC

## 2011-09-11 MED ORDER — ALENDRONATE SODIUM 35 MG PO TABS: 35.0000 mg | ORAL_TABLET | ORAL | Status: DC

## 2011-10-11 ENCOUNTER — Encounter: Payer: Self-pay | Admitting: Internal Medicine

## 2011-10-11 ENCOUNTER — Ambulatory Visit (HOSPITAL_COMMUNITY)
Admission: RE | Admit: 2011-10-11 | Discharge: 2011-10-11 | Disposition: A | Discharge status: Home / Self Care | Payer: Medicare Other | Source: Ambulatory Visit | Attending: Internal Medicine | Admitting: Internal Medicine

## 2011-10-11 ENCOUNTER — Ambulatory Visit (INDEPENDENT_AMBULATORY_CARE_PROVIDER_SITE_OTHER): Payer: Medicare Other | Admitting: Internal Medicine

## 2011-10-11 VITALS — BP 113/72 | HR 82 | Temp 99.6°F | Ht 61.0 in | Wt 84.9 lb

## 2011-10-11 DIAGNOSIS — R05 Cough: Secondary | ICD-10-CM | POA: Insufficient documentation

## 2011-10-11 DIAGNOSIS — Z136 Encounter for screening for cardiovascular disorders: Secondary | ICD-10-CM

## 2011-10-11 DIAGNOSIS — F329 Major depressive disorder, single episode, unspecified: Secondary | ICD-10-CM

## 2011-10-11 DIAGNOSIS — J449 Chronic obstructive pulmonary disease, unspecified: Secondary | ICD-10-CM

## 2011-10-11 DIAGNOSIS — Z79899 Other long term (current) drug therapy: Secondary | ICD-10-CM

## 2011-10-11 DIAGNOSIS — Z79891 Long term (current) use of opiate analgesic: Secondary | ICD-10-CM

## 2011-10-11 DIAGNOSIS — Z13220 Encounter for screening for lipoid disorders: Secondary | ICD-10-CM

## 2011-10-11 DIAGNOSIS — R059 Cough, unspecified: Secondary | ICD-10-CM | POA: Insufficient documentation

## 2011-10-11 DIAGNOSIS — J438 Other emphysema: Secondary | ICD-10-CM | POA: Insufficient documentation

## 2011-10-11 DIAGNOSIS — Z139 Encounter for screening, unspecified: Secondary | ICD-10-CM

## 2011-10-11 DIAGNOSIS — F3289 Other specified depressive episodes: Secondary | ICD-10-CM

## 2011-10-11 DIAGNOSIS — Z7983 Long term (current) use of bisphosphonates: Secondary | ICD-10-CM

## 2011-10-11 DIAGNOSIS — E559 Vitamin D deficiency, unspecified: Secondary | ICD-10-CM

## 2011-10-11 LAB — CBC WITH DIFFERENTIAL/PLATELET
Hemoglobin: 13.9 g/dL (ref 12.0–15.0)
Lymphocytes Relative: 41 % (ref 12–46)
Lymphs Abs: 2.9 10*3/uL (ref 0.7–4.0)
Monocytes Relative: 8 % (ref 3–12)
Neutro Abs: 3.5 10*3/uL (ref 1.7–7.7)
Neutrophils Relative %: 49 % (ref 43–77)
Platelets: 400 10*3/uL (ref 150–400)
RBC: 4.38 MIL/uL (ref 3.87–5.11)
WBC: 7 10*3/uL (ref 4.0–10.5)

## 2011-10-11 MED ORDER — DOXYCYCLINE HYCLATE 100 MG PO TABS: 100.0000 mg | ORAL_TABLET | Freq: Two times a day (BID) | ORAL | Status: AC

## 2011-10-11 MED ORDER — PREDNISONE 10 MG PO TABS: ORAL_TABLET | ORAL | Status: DC

## 2011-10-11 NOTE — Assessment & Plan Note (Addendum)
Shoulder pain is likely 2/2 cough/pleuritic in nature.  Unlikely ACS given her clinical presentation.  Aortic dissection is on the differential but less likely since it is not radiating or tearing sensation.  She may have a mild COPD exacerbation given her sputum production and cough x 1 wk in duration.  Denies any fever or chills. O2 sat was 95% on RA. -Will get CXR to rule out PNA, aortic dissection -Empirically treat with 7 day course of Doxycycline 100mg  bid and Prednisone 40mg  qd -Continue albuterol and advair inhalers -Get CBC with diff -RTO if not improved

## 2011-10-11 NOTE — Assessment & Plan Note (Signed)
Will check lipid panel. 

## 2011-10-11 NOTE — Assessment & Plan Note (Signed)
Will repeat vit D 25 today -Continue taking supplement

## 2011-10-11 NOTE — Progress Notes (Signed)
HPI: [name] is a 61 yo woman with PMH of bipolar disorder, COPD presents with Cough x 1 week, with yellowish sputum.  Pain across her shoulder blade near her fractured ribs when she cough.  Sharp pain that last a few seconds and resolves after coughing spell is gone. Denies any fever or chills. Occasionally she has SOB with exertion. Denies chest pain. Doing well with pain meds- on Percocet 10/325mg  QID, denies any drowsiness or falls recently.   Has appointment with mental health in 10/2011.  ROS: as per HPI  PE: General: alert, thin appearing and cooperative to examination.   Lungs: normal respiratory effort, no accessory muscle use, coarse breath sounds, no crackles, and no wheezes. Heart: normal rate, regular rhythm, no murmur, no gallop, and no rub.  Abdomen: soft, non-tender, normal bowel sounds, no distention, no guarding, no rebound tenderness, Msk: no joint swelling, no joint warmth, and no redness over joints.  Pulses: 2+ DP/PT pulses bilaterally Extremities: No cyanosis, clubbing, edema Neurologic: nonfocal

## 2011-10-12 LAB — LIPID PANEL
Cholesterol: 200 mg/dL (ref 0–200)
Total CHOL/HDL Ratio: 2.8 Ratio
Triglycerides: 70 mg/dL (ref <150)
VLDL: 14 mg/dL (ref 0–40)

## 2011-10-12 LAB — COMPLETE METABOLIC PANEL WITH GFR
ALT: 24 U/L (ref 0–35)
BUN: 7 mg/dL (ref 6–23)
CO2: 27 mEq/L (ref 19–32)
Calcium: 9.1 mg/dL (ref 8.4–10.5)
Creat: 0.68 mg/dL (ref 0.50–1.10)
GFR, Est African American: >89 mL/min
GFR, Est Non African American: >89 mL/min
Glucose, Bld: 75 mg/dL (ref 70–99)
Total Bilirubin: 0.3 mg/dL (ref 0.3–1.2)

## 2011-10-14 ENCOUNTER — Telehealth: Payer: Self-pay | Admitting: *Deleted

## 2011-10-14 NOTE — Telephone Encounter (Signed)
Please call this pt asap re: chest xray and abx  Thank you

## 2011-10-17 NOTE — Telephone Encounter (Signed)
Have spoken w/ pt, she filled meds and started sat. She was asked to call if not feeling better in 3 to 5 days and if she has concerns before, she is agreeable

## 2011-10-17 NOTE — Telephone Encounter (Signed)
Please notify patient to go ahead and take her prescriptions as prescribed including her abx.

## 2011-10-20 ENCOUNTER — Ambulatory Visit (INDEPENDENT_AMBULATORY_CARE_PROVIDER_SITE_OTHER): Payer: Medicare Other | Admitting: Internal Medicine

## 2011-10-20 ENCOUNTER — Encounter: Payer: Self-pay | Admitting: Internal Medicine

## 2011-10-20 VITALS — BP 131/77 | HR 85 | Temp 100.3°F | Ht 61.0 in | Wt 86.2 lb

## 2011-10-20 DIAGNOSIS — J449 Chronic obstructive pulmonary disease, unspecified: Secondary | ICD-10-CM

## 2011-10-20 MED ORDER — DM-GUAIFENESIN ER 30-600 MG PO TB12: 1.0000 | ORAL_TABLET | Freq: Two times a day (BID) | ORAL | Status: AC

## 2011-10-20 NOTE — Assessment & Plan Note (Signed)
The clinical manifestation is consistent with COPD exacerbation. No s/s PNA.  - will continue current prednisone and Doxycycline - will order Mucinex 600 mg po BID x 2 weeks.  - patient is instructed to come back in one week if she does not get better.

## 2011-10-20 NOTE — Patient Instructions (Addendum)
1. Follow  Up with the clinic in one week if you do not get better 2. Come to the clinic or Go to ED if you have increase SOB. Fever.  3. Stop smoking

## 2011-10-20 NOTE — Progress Notes (Signed)
Subjective:   Patient ID: KYLINA VULTAGGIO female   DOB: 1951/02/07 61 y.o.   MRN: [id number]  HPI: Ms.Shaquia ANTHONIA MONGER is a 61 y.o.  woman with PMH of bipolar disorder, COPD and current Tobacco abuse who presents to the clinic for follow ups since her last office visit on 10/11/11.   She was evaluated by Dr. Anselm Jungling on 10/11/11 for one week course of productive cough with yellowish sputum and pleuritic pain across her shoulder blade near her fractured ribs when she cough. Her CXR shows advanced emphysematous changes but no acute overlying pulmonary process.  She was given Doxycycline 100 mg 1 tab BID and prednisone 40 mg po daily x 7 days. She did not get her medications filled until 5 days ago due to financial stress. She reports medical compliance with all of her medications. However, she does not feel better. States that her productive cough is worsening with moderate amount yellowish sputum.  She still has Sharp/stabbing chest pleuritic pain worsening with deep breathing and cough, and lasts a few seconds and resolves after coughing spell is gone. Reports feeling hot and chills but did not take her temperature at home.   Denies headache or night sweats. Denies cold like symptoms. Denies chest pressure or palpitation. Denies N/V abdominal pain. Denies dysuria and urinary frequency and urgency.   Patient has had h/o COPD well controlled with treatment of Albuterol and Advair inhaler. She has occasional SOB and wheezing which is her baseline.  No worsening of SOB or wheezing.   Past Medical History  Diagnosis Date  . Depression   . Hypertension   . Suicidal ideation 2007     attempted overdose 2012  Dr. Gwendalyn Ege report  . Bipolar disorder   . Substance abuse      narcotics, alcohol, tobacco  . Active smoker    Current Outpatient Prescriptions  Medication Sig Dispense Refill  . albuterol (VENTOLIN HFA) 108 (90 BASE) MCG/ACT inhaler Inhale 1 puff into the lungs every 4 (four) hours as needed. For  wheezing  1 Inhaler  6  . alendronate (FOSAMAX) 35 MG tablet Take 1 tablet (35 mg total) by mouth every 7 (seven) days. Take in the morning with a full glass of water, on an empty stomach, and do not take anything else by mouth or lie down for the next 30 min.  4 tablet  6  . calcium carbonate (OS-CAL) 600 MG TABS Take 1 tablet (600 mg total) by mouth 2 (two) times daily.  60 tablet  3  . cholecalciferol (VITAMIN D) 1000 UNITS tablet Take 1 tablet (1,000 Units total) by mouth daily.  31 tablet  6  . CYMBALTA 30 MG capsule TAKE 1 CAPSULE IN THE MORNING AND 2 AT BEDTIME  90 capsule  5  . doxycycline (VIBRA-TABS) 100 MG tablet Take 1 tablet (100 mg total) by mouth 2 (two) times daily.  14 tablet  0  . Fluticasone-Salmeterol (ADVAIR) 250-50 MCG/DOSE AEPB Inhale 1 puff into the lungs 2 (two) times daily.  1 each  6  . lamoTRIgine (LAMICTAL) 100 MG tablet Take by mouth. 1 tablet in the morning and 2 tablets at night       . OLANZapine (ZYPREXA) 5 MG tablet Take 1 tablet (5 mg total) by mouth at bedtime.  30 tablet  3  . oxyCODONE-acetaminophen (PERCOCET) 10-325 MG per tablet Take 1 tablet by mouth every 6 (six) hours as needed for pain.  120 tablet  0  . predniSONE (DELTASONE)  10 MG tablet Take 4 tablets daily x 7 days  28 tablet  0   Family History  Problem Relation Age of Onset  . Stroke Neg Hx   . Cancer Neg Hx    History   Social History  . Marital Status: Single    Spouse Name: N/A    Number of Children: N/A  . Years of Education: N/A   Social History Main Topics  . Smoking status: Current Everyday Smoker -- 0.3 packs/day    Types: Cigarettes  . Smokeless tobacco: None   Comment: trying to quit completely  . Alcohol Use: No  . Drug Use: No  . Sexually Active: None   Other Topics Concern  . None   Social History Narrative  . None   Review of Systems: See HPI  Objective:  Physical Exam: Filed Vitals:   10/20/11 1525  BP: 131/77  Pulse: 85  Temp: 100.3 F (37.9 C)    TempSrc: Oral  Height: 5\' 1"  (1.549 m)  Weight: 86 lb 3.2 oz (39.1 kg)  SpO2: 93%   General: alert, thin appearing and cooperative to examination.   Lungs: normal respiratory effort, no accessory muscle use, coarse breath sounds, with minimum exp. Wheezing noted at bibasilar lungs. no crackles.  Heart: normal rate, regular rhythm, no murmur, no gallop, and no rub.  Abdomen: soft, non-tender, normal bowel sounds, no distention, no guarding, no rebound tenderness, Msk: no joint swelling, no joint warmth, and no redness over joints.  Pulses: 2+ DP/PT pulses bilaterally Extremities: No cyanosis, clubbing, edema Neurologic: nonfocal  Assessment & Plan:

## 2011-10-21 LAB — BASIC METABOLIC PANEL WITH GFR
BUN: 14 mg/dL (ref 6–23)
CO2: 32 mEq/L (ref 19–32)
Calcium: 9.2 mg/dL (ref 8.4–10.5)
GFR, Est African American: >89 mL/min
Glucose, Bld: 107 mg/dL — ABNORMAL HIGH (ref 70–99)
Sodium: 139 mEq/L (ref 135–145)

## 2011-10-27 ENCOUNTER — Other Ambulatory Visit: Payer: Self-pay | Admitting: Internal Medicine

## 2011-11-02 ENCOUNTER — Ambulatory Visit (INDEPENDENT_AMBULATORY_CARE_PROVIDER_SITE_OTHER): Payer: Medicare Other | Admitting: Internal Medicine

## 2011-11-02 ENCOUNTER — Encounter: Payer: Self-pay | Admitting: Internal Medicine

## 2011-11-02 VITALS — BP 119/66 | HR 78 | Temp 98.7°F | Ht 61.0 in | Wt 87.1 lb

## 2011-11-02 DIAGNOSIS — R05 Cough: Secondary | ICD-10-CM

## 2011-11-02 MED ORDER — HYDROCOD POLST-CHLORPHEN POLST 10-8 MG/5ML PO LQCR: 5.0000 mL | Freq: Two times a day (BID) | ORAL | Status: DC | PRN

## 2011-11-02 NOTE — Patient Instructions (Signed)
We are going to give you a medicine to help stop the cough. This medicine has two medicines in it and one prevents the cough and the other treats the cause of the cough which is likely from allergies and irritation of your lungs. Please take it twice a day for 1-2 weeks then up to 2 times daily as needed. Work on stopping smoking. Please come back as needed or in 2 weeks if the cough is not improved. Our number is [phone] and you can call with questions or problems.   Cough, Adult  A cough is a reflex that helps clear your throat and airways. It can help heal the body or may be a reaction to an irritated airway. A cough may only last 2 or 3 weeks (acute) or may last more than 8 weeks (chronic).  CAUSES Acute cough:  Viral or bacterial infections.  Chronic cough:  Infections.   Allergies.   Asthma.   Post-nasal drip.   Smoking.   Heartburn or acid reflux.   Some medicines.   Chronic lung problems (COPD).   Cancer.  SYMPTOMS   Cough.   Fever.   Chest pain.   Increased breathing rate.   High-pitched whistling sound when breathing (wheezing).   Colored mucus that you cough up (sputum).  TREATMENT   A bacterial cough may be treated with antibiotic medicine.   A viral cough must run its course and will not respond to antibiotics.   Your caregiver may recommend other treatments if you have a chronic cough.  HOME CARE INSTRUCTIONS   Only take over-the-counter or prescription medicines for pain, discomfort, or fever as directed by your caregiver. Use cough suppressants only as directed by your caregiver.   Use a cold steam vaporizer or humidifier in your bedroom or home to help loosen secretions.   Sleep in a semi-upright position if your cough is worse at night.   Rest as needed.   Stop smoking if you smoke.  SEEK IMMEDIATE MEDICAL CARE IF:   You have pus in your sputum.   Your cough starts to worsen.   You cannot control your cough with suppressants and  are losing sleep.   You begin coughing up blood.   You have difficulty breathing.   You develop pain which is getting worse or is uncontrolled with medicine.   You have a fever.  MAKE SURE YOU:   Understand these instructions.   Will watch your condition.   Will get help right away if you are not doing well or get worse.  Document Released: 08/27/2010 Document Revised: 02/17/2011 Document Reviewed: 08/27/2010 [facility] Patient Information 2012 [place], [place].

## 2011-11-02 NOTE — Progress Notes (Signed)
Subjective:     Patient ID: [name], female   DOB: [birth year], 61 y.o.   MRN: [id number]  HPI The patient is a 61 YO female who comes in for cough which is ongoing for 1 month now and is not resolved with prednisone taper and doxycycline course. She has significant past medical history of COPD, depression, back pain, allergic rhinitis. She states that during this entire time course she has not had any worsening shortness of breath. She has not had any medications that were stopped or new during this time period except for prednisone, doxycycline. She has not had to use her inhalers more during this time period. She continues to use Advair, albuterol. She states that she occasionally uses the albuterol twice daily. But on a good day may only have to use it once. She states that she gets more short of breath during activity. She never has shortness of breath when she's sitting. She states that this cough has been ongoing and stayed the same over this whole month. She states she has coughing fits where she goes into spasm. She states that she's coughing up a yellowish sputum from time to time. She's not had any fevers. She states subjective chills during this month however doesn't have a thermometer and was not able to check her temperature during this time. She states that she's had prednisone taper with doxycycline course twice during this and has not seemed to help. She also took Mucinex DM which did not help her cough at all. It states that actually helped her to cough up a little bit more but never really stopped her cough. She states that she has no acid reflux symptoms, no sour taste in her mouth except immediately following medication taking. This does clear with brushing her teeth. She has no chest pain no acid reflux pain no pain at nighttime. She states the only pain she gets is when she has coughing fits she gets a little bit sore. She states that she does have allergies and has noticed that at  the onset of this cough she was having a lot more drainage from her allergies. She states that she's still having allergies fairly badly.  Review of Systems  Constitutional: Positive for chills. Negative for fever, diaphoresis, activity change, appetite change, fatigue and unexpected weight change.  HENT: Positive for congestion, rhinorrhea and postnasal drip. Negative for sore throat, sneezing, drooling, mouth sores, trouble swallowing, dental problem, voice change and sinus pressure.   Eyes: Negative.   Respiratory: Positive for cough. Negative for apnea, choking, chest tightness, shortness of breath, wheezing and stridor.   Cardiovascular: Positive for chest pain. Negative for palpitations and leg swelling.       Chest pain with coughing.   Gastrointestinal: Negative for nausea, vomiting, abdominal pain, diarrhea, constipation and abdominal distention.       No reflux or GERD symptoms.   Genitourinary: Negative.   Musculoskeletal: Positive for back pain, arthralgias and gait problem. Negative for myalgias and joint swelling.       Chronic, uses walker with seat to ambulate.  Skin: Negative.   Neurological: Negative.   Hematological: Negative.   Psychiatric/Behavioral: Negative.        Objective:   Physical Exam  Constitutional: She is oriented to person, place, and time. She appears well-developed and well-nourished.       Very skinny.  HENT:  Head: Normocephalic and atraumatic.  Eyes: EOM are normal. Pupils are equal, round, and reactive to light.  Neck: Normal range of motion. Neck supple.  Cardiovascular: Normal rate and regular rhythm.   Pulmonary/Chest: Effort normal. No respiratory distress. She has wheezes. She has no rales. She exhibits no tenderness.       Some mild diffuse expiratory wheeze present. No focal wheezing heard or rales.   Abdominal: Soft. Bowel sounds are normal. She exhibits no distension. There is no tenderness. There is no rebound.  Musculoskeletal:  Normal range of motion. She exhibits no edema.  Neurological: She is alert and oriented to person, place, and time. No cranial nerve deficit.       Assessment/Plan:   1. Chronic cough-the patient does not cough ongoing for one month. She has had treatment for possible COPD exacerbation which did not help. She had chest x-ray at the onset of this cough which did not show any focal pneumonia signs. She does not have any focal GERD symptoms however could remain in the differential. More likely possibility seems to be a postnasal drip/allergic causes to her cough. We'll trial her on first generation antihistamine in tussionex. If this does not alleviate her cough would trial PPI for her. Advised to quit smoking and she has tapered to 2 cigarettes per day.  2. Disposition-the patient will be seen back as needed or in about 2 weeks if her cough does not subside. Advised her to take Tussionex twice daily for 2 weeks then try to back off on doses and see if her cough is alleviated.

## 2011-11-03 MED ORDER — GUAIFENESIN-CODEINE 100-10 MG/5ML PO SYRP: 5.0000 mL | ORAL_SOLUTION | Freq: Three times a day (TID) | ORAL | Status: AC | PRN

## 2011-11-03 MED ORDER — CLEMASTINE FUMARATE 2.68 MG PO TABS: 2.6800 mg | ORAL_TABLET | Freq: Two times a day (BID) | ORAL | Status: DC

## 2011-11-03 NOTE — Addendum Note (Signed)
Addended by: Genella Mech A on: 11/03/2011 09:54 AM   Modules accepted: Orders

## 2011-12-14 ENCOUNTER — Other Ambulatory Visit (INDEPENDENT_AMBULATORY_CARE_PROVIDER_SITE_OTHER): Payer: Self-pay | Admitting: Surgery

## 2011-12-20 ENCOUNTER — Encounter: Payer: Self-pay | Admitting: Internal Medicine

## 2011-12-20 ENCOUNTER — Ambulatory Visit (INDEPENDENT_AMBULATORY_CARE_PROVIDER_SITE_OTHER): Payer: Medicare Other | Admitting: Internal Medicine

## 2011-12-20 VITALS — BP 117/69 | HR 79 | Temp 98.8°F | Wt 86.7 lb

## 2011-12-20 DIAGNOSIS — Z23 Encounter for immunization: Secondary | ICD-10-CM

## 2011-12-20 DIAGNOSIS — J329 Chronic sinusitis, unspecified: Secondary | ICD-10-CM

## 2011-12-20 DIAGNOSIS — J449 Chronic obstructive pulmonary disease, unspecified: Secondary | ICD-10-CM

## 2011-12-20 MED ORDER — PSEUDOEPHEDRINE HCL 60 MG PO TABS: 60.0000 mg | ORAL_TABLET | ORAL | Status: DC | PRN

## 2011-12-20 MED ORDER — ALBUTEROL SULFATE (2.5 MG/3ML) 0.083% IN NEBU: 2.5000 mg | INHALATION_SOLUTION | Freq: Four times a day (QID) | RESPIRATORY_TRACT | Status: DC | PRN

## 2011-12-20 MED ORDER — AMOXICILLIN-POT CLAVULANATE 875-125 MG PO TABS: 1.0000 | ORAL_TABLET | Freq: Two times a day (BID) | ORAL | Status: AC

## 2011-12-20 MED ORDER — FLUTICASONE PROPIONATE 50 MCG/ACT NA SUSP: 1.0000 | Freq: Every day | NASAL | Status: DC

## 2011-12-20 MED ORDER — TIOTROPIUM BROMIDE MONOHYDRATE 18 MCG IN CAPS: 18.0000 ug | ORAL_CAPSULE | Freq: Every day | RESPIRATORY_TRACT | Status: DC

## 2011-12-20 NOTE — Assessment & Plan Note (Signed)
Clinical presentation and physical exam is consistent with sinusitis, this maybe bacterial so will treat with a course of abx. -Will start Augmentin 875mg  po bid x 10 days -Nasal steroids with Flonase -Antihistamine-Clemastine was prescribed to her during last visit with Dr. Dorise Hiss -Nasal decongestant with over the counter sudafed PRN -Follow up in 2 weeks if no improvement

## 2011-12-20 NOTE — Progress Notes (Signed)
HPI: Ms. Errickson is a 60 yo woman with history of bipolar disorder, narcotic abuse now being managed by pain clinic, COPD presents today for follow up. She has been having sinus symptoms in the past 7 days with purulent(yellow and green) nasal discharge.  She also tenderness over the frontal and maxillary sinuses.  Denies any fever or ear pain. She also has post nasal congestion and SOB and productive cough with white/yellow sputum in the past several months. She completed a course of prednisone and doxy but still has a cough.  She denies having any GERD symptoms.  She still smokes about 2 cigarettes per day.   As for her pain- it is worse since the cold weather start, pain mostly in her back and legs.  She states that pain management had put stimulator in her back which really helped her leg pain but then did nothing for her back so they took it out. They are planning to insert it again but at a higher place in her spinal cord to help with her back.  No other complaints today. O2 was 89 % on arrival but ambulating O2 was 92% on RA. Patient is not on home O2.   She does want a pap smear next office visit  ROS: as per HPI  PE: General: alert, thin appearing, and cooperative to examination.  Mouth: pharynx pink and moist, no erythema, and no exudates.  FAce:+tenderness to maxillary and frontal sinuses.  TM unremarkable bilaterally.  Nasal mucosa erythematous and mild purulent discharge Lungs: normal respiratory effort, no accessory muscle use, mild breath sounds at bases, no crackles, and no wheezes. Heart: normal rate, regular rhythm, no murmur, no gallop, and no rub.  Abdomen: soft, non-tender, normal bowel sounds, no distention, no guarding, no rebound tenderness Msk: no joint swelling, no joint warmth, and no redness over joints.  Pulses: 2+ DP/PT pulses bilaterally Extremities: No cyanosis, clubbing, edema Neurologic: nonfocal  Skin: turgor normal and no rashes.  Psych: Oriented X3, memory  intact for recent and remote, normally interactive, good eye contact, + anxious appearing, and not depressed appearing.

## 2011-12-20 NOTE — Patient Instructions (Addendum)
Start taking Augmentin 875mg  one tablet twice daily Start using Flonase 1 spray in each nostril daily You can start using your nebulizers every 6 hours as needed for shortness of breath You can also take Sudafed over the counter for your congestion Follow up in 2 weeks if no improvement

## 2011-12-20 NOTE — Assessment & Plan Note (Addendum)
I do not think she has a COPD exacerbation.  Lung exam did not show any wheezing or tightness except for mild coarse breath sounds at bases.  I think her cough is 2/2 smoking and I encouraged her to stop smoking.  She has been using inhalers as prescribed.  I think she may benefit for nebulizers at home PRN.  Will not treat with prednisone and abx at this time.  Her O2 sat on ambulation was 92% on RA. -Will start neubulizer-albuterol q6hr prn -I ordered nebulizer machine -Continue albuterol and advair inhaler -Will add spiriva to her regimen

## 2011-12-22 ENCOUNTER — Other Ambulatory Visit: Payer: Self-pay | Admitting: *Deleted

## 2011-12-22 DIAGNOSIS — J449 Chronic obstructive pulmonary disease, unspecified: Secondary | ICD-10-CM

## 2011-12-23 MED ORDER — ALBUTEROL SULFATE (2.5 MG/3ML) 0.083% IN NEBU: 2.5000 mg | INHALATION_SOLUTION | Freq: Four times a day (QID) | RESPIRATORY_TRACT | Status: DC | PRN

## 2011-12-27 MED ORDER — ALBUTEROL SULFATE (2.5 MG/3ML) 0.083% IN NEBU: 2.5000 mg | INHALATION_SOLUTION | Freq: Four times a day (QID) | RESPIRATORY_TRACT | Status: DC | PRN

## 2011-12-27 NOTE — Addendum Note (Signed)
Addended by: Maura Crandall on: 12/27/2011 01:46 PM   Modules accepted: Orders

## 2011-12-28 ENCOUNTER — Telehealth: Payer: Self-pay | Admitting: *Deleted

## 2011-12-28 NOTE — Telephone Encounter (Signed)
Rx faxed.Kingsley Spittle Cassady10/16/20133:17 PM

## 2011-12-28 NOTE — Telephone Encounter (Signed)
Message left from [facility] pharmacist requesting Albuterol rx. Telephone note 12/22/11 showed rx was electronically sent to [facility]. I called Ms. Elvin So; stated she only uses [facility] - has not heard of [facility]. And she has already received the refill from [facility].

## 2012-01-10 ENCOUNTER — Emergency Department (HOSPITAL_COMMUNITY): Payer: Medicare Other

## 2012-01-10 ENCOUNTER — Encounter (HOSPITAL_COMMUNITY): Payer: Self-pay

## 2012-01-10 ENCOUNTER — Inpatient Hospital Stay (HOSPITAL_COMMUNITY)
Admission: EM | Admit: 2012-01-10 | Discharge: 2012-01-12 | DRG: 193 | Disposition: A | Discharge status: Home / Self Care | Payer: Medicare Other | Attending: Infectious Diseases | Admitting: Infectious Diseases

## 2012-01-10 DIAGNOSIS — I509 Heart failure, unspecified: Secondary | ICD-10-CM | POA: Diagnosis present

## 2012-01-10 DIAGNOSIS — J441 Chronic obstructive pulmonary disease with (acute) exacerbation: Secondary | ICD-10-CM | POA: Diagnosis present

## 2012-01-10 DIAGNOSIS — L8991 Pressure ulcer of unspecified site, stage 1: Secondary | ICD-10-CM | POA: Diagnosis present

## 2012-01-10 DIAGNOSIS — F1010 Alcohol abuse, uncomplicated: Secondary | ICD-10-CM | POA: Diagnosis present

## 2012-01-10 DIAGNOSIS — G8929 Other chronic pain: Secondary | ICD-10-CM | POA: Diagnosis present

## 2012-01-10 DIAGNOSIS — J9620 Acute and chronic respiratory failure, unspecified whether with hypoxia or hypercapnia: Secondary | ICD-10-CM | POA: Diagnosis present

## 2012-01-10 DIAGNOSIS — Z681 Body mass index (BMI) 19 or less, adult: Secondary | ICD-10-CM

## 2012-01-10 DIAGNOSIS — L89109 Pressure ulcer of unspecified part of back, unspecified stage: Secondary | ICD-10-CM | POA: Diagnosis present

## 2012-01-10 DIAGNOSIS — E559 Vitamin D deficiency, unspecified: Secondary | ICD-10-CM | POA: Diagnosis present

## 2012-01-10 DIAGNOSIS — F3130 Bipolar disorder, current episode depressed, mild or moderate severity, unspecified: Secondary | ICD-10-CM

## 2012-01-10 DIAGNOSIS — R64 Cachexia: Secondary | ICD-10-CM | POA: Diagnosis present

## 2012-01-10 DIAGNOSIS — F1110 Opioid abuse, uncomplicated: Secondary | ICD-10-CM | POA: Diagnosis present

## 2012-01-10 DIAGNOSIS — F40298 Other specified phobia: Secondary | ICD-10-CM | POA: Diagnosis not present

## 2012-01-10 DIAGNOSIS — I10 Essential (primary) hypertension: Secondary | ICD-10-CM | POA: Diagnosis present

## 2012-01-10 DIAGNOSIS — R000 Tachycardia, unspecified: Secondary | ICD-10-CM | POA: Diagnosis present

## 2012-01-10 DIAGNOSIS — R7989 Other specified abnormal findings of blood chemistry: Secondary | ICD-10-CM | POA: Diagnosis present

## 2012-01-10 DIAGNOSIS — E871 Hypo-osmolality and hyponatremia: Secondary | ICD-10-CM

## 2012-01-10 DIAGNOSIS — F17200 Nicotine dependence, unspecified, uncomplicated: Secondary | ICD-10-CM | POA: Diagnosis present

## 2012-01-10 DIAGNOSIS — J189 Pneumonia, unspecified organism: Principal | ICD-10-CM | POA: Diagnosis present

## 2012-01-10 DIAGNOSIS — Z79899 Other long term (current) drug therapy: Secondary | ICD-10-CM

## 2012-01-10 DIAGNOSIS — I959 Hypotension, unspecified: Secondary | ICD-10-CM | POA: Diagnosis present

## 2012-01-10 DIAGNOSIS — F411 Generalized anxiety disorder: Secondary | ICD-10-CM | POA: Diagnosis present

## 2012-01-10 HISTORY — DX: Vitamin D deficiency, unspecified: E55.9

## 2012-01-10 HISTORY — DX: Low back pain, unspecified: M54.50

## 2012-01-10 HISTORY — DX: Shortness of breath: R06.02

## 2012-01-10 HISTORY — DX: Chronic obstructive pulmonary disease, unspecified: J44.9

## 2012-01-10 HISTORY — DX: Chronic pain syndrome: G89.4

## 2012-01-10 HISTORY — DX: Low back pain: M54.5

## 2012-01-10 HISTORY — DX: Anxiety disorder, unspecified: F41.9

## 2012-01-10 HISTORY — DX: Unspecified osteoarthritis, unspecified site: M19.90

## 2012-01-10 HISTORY — DX: Other chronic pain: G89.29

## 2012-01-10 LAB — POCT I-STAT 3, VENOUS BLOOD GAS (G3P V)
Bicarbonate: 25.8 mEq/L — ABNORMAL HIGH (ref 20.0–24.0)
pH, Ven: 7.259 (ref 7.250–7.300)

## 2012-01-10 LAB — CREATININE, URINE, RANDOM: Creatinine, Urine: 23.1 mg/dL

## 2012-01-10 LAB — CBC WITH DIFFERENTIAL/PLATELET
Basophils Absolute: 0 10*3/uL (ref 0.0–0.1)
Basophils Relative: 0 % (ref 0–1)
Eosinophils Relative: 0 % (ref 0–5)
HCT: 34.5 % — ABNORMAL LOW (ref 36.0–46.0)
MCH: 31.7 pg (ref 26.0–34.0)
MCHC: 34.8 g/dL (ref 30.0–36.0)
MCV: 91 fL (ref 78.0–100.0)
Monocytes Absolute: 1.1 10*3/uL — ABNORMAL HIGH (ref 0.1–1.0)
RDW: 12.2 % (ref 11.5–15.5)

## 2012-01-10 LAB — COMPREHENSIVE METABOLIC PANEL
AST: 142 U/L — ABNORMAL HIGH (ref 0–37)
Albumin: 3.4 g/dL — ABNORMAL LOW (ref 3.5–5.2)
CO2: 24 mEq/L (ref 19–32)
Calcium: 7.9 mg/dL — ABNORMAL LOW (ref 8.4–10.5)
Creatinine, Ser: 0.28 mg/dL — ABNORMAL LOW (ref 0.50–1.10)
GFR calc non Af Amer: >90 mL/min (ref >90)

## 2012-01-10 LAB — BASIC METABOLIC PANEL
BUN: 6 mg/dL (ref 6–23)
CO2: 25 mEq/L (ref 19–32)
CO2: 25 mEq/L (ref 19–32)
Calcium: 7.9 mg/dL — ABNORMAL LOW (ref 8.4–10.5)
Chloride: 84 mEq/L — ABNORMAL LOW (ref 96–112)
Creatinine, Ser: 0.31 mg/dL — ABNORMAL LOW (ref 0.50–1.10)
Creatinine, Ser: 0.33 mg/dL — ABNORMAL LOW (ref 0.50–1.10)
GFR calc Af Amer: >90 mL/min (ref >90)
GFR calc non Af Amer: >90 mL/min (ref >90)
Glucose, Bld: 164 mg/dL — ABNORMAL HIGH (ref 70–99)
Potassium: 4.5 mEq/L (ref 3.5–5.1)
Sodium: 118 mEq/L — CL (ref 135–145)
Sodium: 124 mEq/L — ABNORMAL LOW (ref 135–145)

## 2012-01-10 LAB — SODIUM, URINE, RANDOM: Sodium, Ur: <10 mEq/L

## 2012-01-10 LAB — HEPATIC FUNCTION PANEL
AST: 133 U/L — ABNORMAL HIGH (ref 0–37)
Bilirubin, Direct: <0.1 mg/dL (ref 0.0–0.3)

## 2012-01-10 LAB — TSH: TSH: 1.03 u[IU]/mL (ref 0.350–4.500)

## 2012-01-10 LAB — OSMOLALITY: Osmolality: 248 mOsm/kg — ABNORMAL LOW (ref 275–300)

## 2012-01-10 LAB — OSMOLALITY, URINE: Osmolality, Ur: 464 mOsm/kg (ref 390–1090)

## 2012-01-10 LAB — RAPID URINE DRUG SCREEN, HOSP PERFORMED
Amphetamines: NOT DETECTED
Barbiturates: NOT DETECTED
Benzodiazepines: NOT DETECTED

## 2012-01-10 LAB — PRO B NATRIURETIC PEPTIDE: Pro B Natriuretic peptide (BNP): 1969 pg/mL — ABNORMAL HIGH (ref 0–125)

## 2012-01-10 LAB — CK TOTAL AND CKMB (NOT AT ARMC)
CK, MB: 16.2 ng/mL (ref 0.3–4.0)
Relative Index: 4 — ABNORMAL HIGH (ref 0.0–2.5)
Total CK: 519 U/L — ABNORMAL HIGH (ref 7–177)

## 2012-01-10 LAB — GLUCOSE, CAPILLARY: Glucose-Capillary: 102 mg/dL — ABNORMAL HIGH (ref 70–99)

## 2012-01-10 LAB — MRSA PCR SCREENING: MRSA by PCR: NEGATIVE

## 2012-01-10 LAB — TROPONIN I: Troponin I: <0.3 ng/mL (ref <0.30)

## 2012-01-10 LAB — LACTIC ACID, PLASMA: Lactic Acid, Venous: 1 mmol/L (ref 0.5–2.2)

## 2012-01-10 MED ORDER — ALBUTEROL SULFATE (5 MG/ML) 0.5% IN NEBU
10.0000 mg | INHALATION_SOLUTION | RESPIRATORY_TRACT | Status: DC
  Filled 2012-01-10: qty 0.5

## 2012-01-10 MED ORDER — LORAZEPAM 2 MG/ML IJ SOLN
0.5000 mg | Freq: Four times a day (QID) | INTRAMUSCULAR | Status: DC | PRN
  Administered 2012-01-10: 0.5 mg via INTRAVENOUS
  Filled 2012-01-10: qty 1

## 2012-01-10 MED ORDER — ALBUTEROL SULFATE (5 MG/ML) 0.5% IN NEBU
2.5000 mg | INHALATION_SOLUTION | RESPIRATORY_TRACT | Status: DC | PRN
  Administered 2012-01-10: 2.5 mg via RESPIRATORY_TRACT

## 2012-01-10 MED ORDER — AZITHROMYCIN 500 MG IV SOLR
250.0000 mg | INTRAVENOUS | Status: DC
  Administered 2012-01-11: 250 mg via INTRAVENOUS
  Filled 2012-01-10: qty 250

## 2012-01-10 MED ORDER — METHYLPREDNISOLONE SODIUM SUCC 125 MG IJ SOLR
60.0000 mg | Freq: Two times a day (BID) | INTRAMUSCULAR | Status: DC
  Administered 2012-01-10 – 2012-01-11 (×2): 60 mg via INTRAVENOUS
  Filled 2012-01-10 (×3): qty 0.96

## 2012-01-10 MED ORDER — DEXTROSE 5 % IV SOLN
1.0000 g | INTRAVENOUS | Status: DC
  Administered 2012-01-10: 1 g via INTRAVENOUS
  Filled 2012-01-10: qty 10

## 2012-01-10 MED ORDER — IPRATROPIUM BROMIDE 0.02 % IN SOLN
0.5000 mg | RESPIRATORY_TRACT | Status: DC | PRN
  Administered 2012-01-10: 0.5 mg via RESPIRATORY_TRACT

## 2012-01-10 MED ORDER — IPRATROPIUM BROMIDE 0.02 % IN SOLN
0.5000 mg | RESPIRATORY_TRACT | Status: DC
  Filled 2012-01-10: qty 2.5

## 2012-01-10 MED ORDER — METHYLPREDNISOLONE SODIUM SUCC 125 MG IJ SOLR
125.0000 mg | Freq: Once | INTRAMUSCULAR | Status: AC
  Administered 2012-01-10: 125 mg via INTRAVENOUS
  Filled 2012-01-10: qty 2

## 2012-01-10 MED ORDER — DEXTROSE 5 % IV SOLN
500.0000 mg | Freq: Once | INTRAVENOUS | Status: AC
  Administered 2012-01-10: 500 mg via INTRAVENOUS
  Filled 2012-01-10: qty 500

## 2012-01-10 MED ORDER — ONDANSETRON HCL 4 MG/2ML IJ SOLN: 4.0000 mg | Freq: Three times a day (TID) | INTRAMUSCULAR | Status: AC | PRN

## 2012-01-10 MED ORDER — OXYCODONE HCL 5 MG PO TABS
5.0000 mg | ORAL_TABLET | Freq: Four times a day (QID) | ORAL | Status: DC | PRN
  Administered 2012-01-10 – 2012-01-12 (×4): 5 mg via ORAL
  Filled 2012-01-10 (×4): qty 1

## 2012-01-10 MED ORDER — FUROSEMIDE 10 MG/ML IJ SOLN
20.0000 mg | Freq: Once | INTRAMUSCULAR | Status: DC
  Filled 2012-01-10: qty 2

## 2012-01-10 MED ORDER — ALBUTEROL SULFATE (5 MG/ML) 0.5% IN NEBU
2.5000 mg | INHALATION_SOLUTION | RESPIRATORY_TRACT | Status: DC
  Administered 2012-01-10 – 2012-01-12 (×10): 2.5 mg via RESPIRATORY_TRACT
  Filled 2012-01-10 (×10): qty 0.5

## 2012-01-10 MED ORDER — CALCIUM CARBONATE 1250 (500 CA) MG PO TABS
1250.0000 mg | ORAL_TABLET | Freq: Two times a day (BID) | ORAL | Status: DC
  Administered 2012-01-10 – 2012-01-12 (×4): 1250 mg via ORAL
  Filled 2012-01-10 (×5): qty 1

## 2012-01-10 MED ORDER — ALBUTEROL SULFATE (5 MG/ML) 0.5% IN NEBU
10.0000 mg | INHALATION_SOLUTION | RESPIRATORY_TRACT | Status: DC
  Administered 2012-01-10: 10 mg via RESPIRATORY_TRACT
  Filled 2012-01-10: qty 0.5
  Filled 2012-01-10: qty 1.5

## 2012-01-10 MED ORDER — DEXTROSE 5 % IV SOLN: 500.0000 mg | Freq: Every day | INTRAVENOUS | Status: DC

## 2012-01-10 MED ORDER — LAMOTRIGINE 100 MG PO TABS
100.0000 mg | ORAL_TABLET | Freq: Every day | ORAL | Status: DC
  Administered 2012-01-11 – 2012-01-12 (×2): 100 mg via ORAL
  Filled 2012-01-10 (×2): qty 1

## 2012-01-10 MED ORDER — VITAMIN D3 25 MCG (1000 UNIT) PO TABS
1000.0000 [IU] | ORAL_TABLET | Freq: Every day | ORAL | Status: DC
  Administered 2012-01-10 – 2012-01-12 (×3): 1000 [IU] via ORAL
  Filled 2012-01-10 (×3): qty 1

## 2012-01-10 MED ORDER — LORATADINE 10 MG PO TABS
10.0000 mg | ORAL_TABLET | Freq: Every day | ORAL | Status: DC
  Administered 2012-01-11 – 2012-01-12 (×2): 10 mg via ORAL
  Filled 2012-01-10 (×2): qty 1

## 2012-01-10 MED ORDER — LAMOTRIGINE 200 MG PO TABS
200.0000 mg | ORAL_TABLET | Freq: Every day | ORAL | Status: DC
  Administered 2012-01-10 – 2012-01-11 (×2): 200 mg via ORAL
  Filled 2012-01-10 (×4): qty 1

## 2012-01-10 MED ORDER — OXYCODONE-ACETAMINOPHEN 10-325 MG PO TABS: 1.0000 | ORAL_TABLET | Freq: Four times a day (QID) | ORAL | Status: DC | PRN

## 2012-01-10 MED ORDER — DULOXETINE HCL 60 MG PO CPEP
60.0000 mg | ORAL_CAPSULE | Freq: Every day | ORAL | Status: DC
  Administered 2012-01-10 – 2012-01-11 (×2): 60 mg via ORAL
  Filled 2012-01-10 (×3): qty 1

## 2012-01-10 MED ORDER — BIOTENE DRY MOUTH MT LIQD
15.0000 mL | Freq: Two times a day (BID) | OROMUCOSAL | Status: DC
  Administered 2012-01-10 – 2012-01-12 (×5): 15 mL via OROMUCOSAL

## 2012-01-10 MED ORDER — MOMETASONE FURO-FORMOTEROL FUM 100-5 MCG/ACT IN AERO: 2.0000 | INHALATION_SPRAY | Freq: Two times a day (BID) | RESPIRATORY_TRACT | Status: DC

## 2012-01-10 MED ORDER — IPRATROPIUM BROMIDE 0.02 % IN SOLN
0.5000 mg | RESPIRATORY_TRACT | Status: DC
  Administered 2012-01-10 – 2012-01-12 (×10): 0.5 mg via RESPIRATORY_TRACT
  Filled 2012-01-10 (×10): qty 2.5

## 2012-01-10 MED ORDER — OXYCODONE-ACETAMINOPHEN 5-325 MG PO TABS
1.0000 | ORAL_TABLET | Freq: Four times a day (QID) | ORAL | Status: DC | PRN
  Administered 2012-01-10 – 2012-01-12 (×5): 1 via ORAL
  Filled 2012-01-10 (×6): qty 1

## 2012-01-10 MED ORDER — DULOXETINE HCL 30 MG PO CPEP
30.0000 mg | ORAL_CAPSULE | Freq: Every day | ORAL | Status: DC
  Administered 2012-01-11 – 2012-01-12 (×2): 30 mg via ORAL
  Filled 2012-01-10 (×2): qty 1

## 2012-01-10 MED ORDER — FLUTICASONE PROPIONATE 50 MCG/ACT NA SUSP
1.0000 | Freq: Every day | NASAL | Status: DC
  Administered 2012-01-11 – 2012-01-12 (×2): 1 via NASAL
  Filled 2012-01-10: qty 16

## 2012-01-10 MED ORDER — CALCIUM CARBONATE 600 MG PO TABS
600.0000 mg | ORAL_TABLET | Freq: Two times a day (BID) | ORAL | Status: DC
  Filled 2012-01-10: qty 1

## 2012-01-10 MED ORDER — OLANZAPINE 5 MG PO TABS
5.0000 mg | ORAL_TABLET | Freq: Every day | ORAL | Status: DC
  Administered 2012-01-10 – 2012-01-11 (×2): 5 mg via ORAL
  Filled 2012-01-10 (×3): qty 1

## 2012-01-10 MED ORDER — ALBUTEROL SULFATE HFA 108 (90 BASE) MCG/ACT IN AERS
2.0000 | INHALATION_SPRAY | RESPIRATORY_TRACT | Status: DC | PRN
  Filled 2012-01-10: qty 6.7

## 2012-01-10 MED ORDER — CHLORHEXIDINE GLUCONATE 0.12 % MT SOLN
15.0000 mL | Freq: Two times a day (BID) | OROMUCOSAL | Status: DC
  Administered 2012-01-10 – 2012-01-12 (×4): 15 mL via OROMUCOSAL
  Filled 2012-01-10 (×6): qty 15

## 2012-01-10 MED ORDER — BENZONATATE 100 MG PO CAPS
100.0000 mg | ORAL_CAPSULE | Freq: Three times a day (TID) | ORAL | Status: DC | PRN
  Filled 2012-01-10 (×2): qty 1

## 2012-01-10 MED ORDER — IPRATROPIUM BROMIDE 0.02 % IN SOLN
0.5000 mg | RESPIRATORY_TRACT | Status: DC
  Administered 2012-01-10: 0.5 mg via RESPIRATORY_TRACT
  Filled 2012-01-10 (×2): qty 2.5

## 2012-01-10 NOTE — ED Notes (Signed)
Respiratory at bedside with RN ready to transport pt to floor. Admitting MD at bedside speaking to pt.

## 2012-01-10 NOTE — Progress Notes (Signed)
CRITICAL VALUE ALERT  Critical value received:  Serum osmolarity-248  Date of notification:  01-10-12  Time of notification:  1633  Critical value read back:yes  Nurse who received alert:  Armida Sans RN  MD notified (1st page):  Saralyn Pilar MD  Time of first page:  1635  MD notified (2nd page):  Time of second page:  Responding MD:  Saralyn Pilar MD  Time MD responded:  1640

## 2012-01-10 NOTE — ED Provider Notes (Signed)
History     CSN: [id number]  Arrival date & time 01/10/12  4098   First MD Initiated Contact with Patient 01/10/12 [id number]      No chief complaint on file.   (Consider location/radiation/quality/duration/timing/severity/associated sxs/prior treatment) HPI Respiratory distress, wheezing.  Pt with h/o of COPD. Does not wear oxygen at home. Denies any pain.  The location of the patient's problem is lungs and occurred at home. Pt lives by herself.  Onset was gradual last night with worsening course since that time.   Modifying factors:  Worse with exertion. Did not improve with home breathing treatment.  Associated symptoms: productive cough, no fever, no abd pain. No AMS.    Past Medical History  Diagnosis Date  . Depression   . Hypertension   . Suicidal ideation 2007     attempted overdose 2012  Dr. Gwendalyn Ege report  . Bipolar disorder   . Substance abuse      narcotics, alcohol, tobacco  . Active smoker   . COPD (chronic obstructive pulmonary disease)     History reviewed. No pertinent past surgical history.  Family History  Problem Relation Age of Onset  . Stroke Neg Hx   . Cancer Neg Hx     History  Substance Use Topics  . Smoking status: Current Every Day Smoker -- 0.3 packs/day    Types: Cigarettes  . Smokeless tobacco: Not on file   Comment: trying to quit completely  . Alcohol Use: No    OB History    Grav Para Term Preterm Abortions TAB SAB Ect Mult Living                  Review of Systems Positive for respiratory distress, cough. Negative for vomiting, diarrhea.  All other systems reviewed and negative unless noted in HPI.    Allergies  Review of patient's allergies indicates no known allergies.  Home Medications   Current Outpatient Rx  Name Route Sig Dispense Refill  . ALBUTEROL SULFATE (2.5 MG/3ML) 0.083% IN NEBU Nebulization Take 3 mLs (2.5 mg total) by nebulization every 6 (six) hours as needed for wheezing. 75 mL 12  . ALBUTEROL SULFATE  HFA 108 (90 BASE) MCG/ACT IN AERS Inhalation Inhale 1 puff into the lungs every 4 (four) hours as needed. For wheezing 1 Inhaler 6  . ALENDRONATE SODIUM 35 MG PO TABS Oral Take 1 tablet (35 mg total) by mouth every 7 (seven) days. Take in the morning with a full glass of water, on an empty stomach, and do not take anything else by mouth or lie down for the next 30 min. 4 tablet 6  . CALCIUM CARBONATE 600 MG PO TABS Oral Take 1 tablet (600 mg total) by mouth 2 (two) times daily. 60 tablet 3  . VITAMIN D 1000 UNITS PO TABS Oral Take 1 tablet (1,000 Units total) by mouth daily. 31 tablet 6  . CLEMASTINE FUMARATE 2.68 MG PO TABS Oral Take 1 tablet (2.68 mg total) by mouth 2 (two) times daily. 60 tablet 0  . CYMBALTA 30 MG PO CPEP  TAKE 1 CAPSULE IN THE MORNING AND 2 AT BEDTIME 90 capsule 5  . FLUTICASONE PROPIONATE 50 MCG/ACT NA SUSP Nasal Place 1 spray into the nose daily. 16 g 2  . FLUTICASONE-SALMETEROL 250-50 MCG/DOSE IN AEPB Inhalation Inhale 1 puff into the lungs 2 (two) times daily. 1 each 6  . LAMOTRIGINE 100 MG PO TABS Oral Take by mouth. 1 tablet in the morning and 2 tablets  at night     . OLANZAPINE 5 MG PO TABS Oral Take 1 tablet (5 mg total) by mouth at bedtime. 30 tablet 3  . OXYCODONE-ACETAMINOPHEN 10-325 MG PO TABS Oral Take 1 tablet by mouth every 6 (six) hours as needed for pain. 120 tablet 0    Prescribed by Pain CLINIC  . PROAIR HFA 108 (90 BASE) MCG/ACT IN AERS  INHALE 1 PUFF IN MOUTH EVERY 4 HOURS AS NEEDED FOR WHEEZING 1 Inhaler 2  . PSEUDOEPHEDRINE HCL 60 MG PO TABS Oral Take 1 tablet (60 mg total) by mouth every 4 (four) hours as needed for congestion. 30 tablet 0  . TIOTROPIUM BROMIDE MONOHYDRATE 18 MCG IN CAPS Inhalation Place 1 capsule (18 mcg total) into inhaler and inhale daily. 30 capsule 2    BP 160/77  Pulse 96  Temp 98.4 F (36.9 C) (Oral)  Resp 22  SpO2 100%  Physical Exam Nursing note and vitals reviewed.  Constitutional: Pt is alert and appears stated age.  Able to speak in 2-3 word phrases. Oropharynx: Airway open without erythema or exudate. Respiratory: Tachypnea. Wheezing bilaterally.  CV: Extremities warm and well perfused. Neuro: No motor nor sensory deficit. Head: Normocephalic and atraumatic. Eyes: No conjunctivitis, no scleral icterus. Neck: Supple, no mass. Chest: Non-tender. Abdomen: Soft, non-tender MSK: Extremities are atraumatic without deformity. Skin: No rash, no wounds.  ED Course  Procedures (including critical care time)  Labs Reviewed  CBC WITH DIFFERENTIAL - Abnormal; Notable for the following:    WBC 13.0 (*)     RBC 3.79 (*)     HCT 34.5 (*)     Neutrophils Relative 86 (*)     Neutro Abs 11.1 (*)     Lymphocytes Relative 6 (*)     Monocytes Absolute 1.1 (*)     All other components within normal limits  COMPREHENSIVE METABOLIC PANEL - Abnormal; Notable for the following:    Sodium 117 (*)     Chloride 81 (*)     Glucose, Bld 133 (*)     Creatinine, Ser 0.28 (*)     Calcium 7.9 (*)     Albumin 3.4 (*)     AST 142 (*)     ALT 166 (*)     Alkaline Phosphatase 258 (*)     All other components within normal limits  PRO B NATRIURETIC PEPTIDE - Abnormal; Notable for the following:    Pro B Natriuretic peptide (BNP) 1969.0 (*)     All other components within normal limits  POCT I-STAT 3, BLOOD GAS (G3P V) - Abnormal; Notable for the following:    pCO2, Ven 57.5 (*)     pO2, Ven 122.0 (*)     Bicarbonate 25.8 (*)     Acid-base deficit 3.0 (*)     All other components within normal limits  LACTIC ACID, PLASMA  TROPONIN I  CULTURE, BLOOD (ROUTINE X 2)  CULTURE, BLOOD (ROUTINE X 2)  OSMOLALITY, URINE  SODIUM, URINE, RANDOM  OSMOLALITY  HEPATIC FUNCTION PANEL  TSH  HEPATITIS PANEL, ACUTE  BASIC METABOLIC PANEL  URINE RAPID DRUG SCREEN (HOSP PERFORMED)   Dg Chest Portable 1 View  01/10/2012  *RADIOLOGY REPORT*  Clinical Data: Severe COPD today, former smoking history  PORTABLE CHEST - 1 VIEW   Comparison: Chest x-ray of 10/11/2011  Findings: Severe changes of COPD are noted with marked hyperaeration.  There is some haziness at the left lung base and a patchy area of pneumonia cannot  be excluded.  A lateral chest x-ray would be helpful.  No definite effusion is seen.  Mediastinal contours are stable.  The heart is within normal limits in size.  IMPRESSION:  1.  Severe COPD. 2.  Haziness at the left lung base.  Cannot exclude pneumonia. Recommend two-view chest x-ray of possible   Original Report Authenticated By: Juline Patch, M.D.      1. COPD exacerbation   2. Hyponatremia       MDM  61 y.o. female here with respiratory distress, wheezing.  Pertinent past problems include COPD, tobacco use, HTN, bipolar. Likely COPD exacerbation will treat accordingly. Will also give abx due to sputum production.   Medications/interventions:  Oxygen, duoneb continuous, IV solumedrol, IV rocephin, IV azithro.   Data reviewed: EKG ordered and interpreted by me: NSR, no QRS widening, nonspecific ST-T wave changes, no significant changes fromprior EKG and right atrial enlargment.  Lab tests ordered and reviewed by me: VBG with respiratory acidosis, CBC with slight leukocytosis, BNP elevated, CMP remarkable for hyponatremia, elevated liver enzymes. Lactic acid, troponin low.  I independently viewed the following imaging studies and reviewed radiology's interpretation as summarized: CXR with severe COPD, cannot exclude pneumonia.  Course of care: Started on bipap secondary to respiratory acidosis. Pt continues to be stable. Called medicine teaching service. Plan for step down.   Medical Decision Making discussed with ED attending Gerhard Munch, MD         Charm Barges, MD 01/10/12 1316

## 2012-01-10 NOTE — ED Notes (Signed)
Pt undressed, in gown, on monitor, continuous pulse oximetry and  blood pressure cuff; EKG performed; pt receiving a breathing treatment at this time

## 2012-01-10 NOTE — Progress Notes (Signed)
2035: Pt 02 stats @ 100% on BIPAP, pt noncompliant with keeping mask on. Weaned pt on to nasal cannula at 4/L min. Pt no distress noted. O2 stats >95%. Pt complains of hunger and feeling fatigued.Will continue to monitor and update MD on pt status and complaints of pt. 2145: Dr.Kesty notified of pt status and complaints. MD reccommended to wean pt  down to 2L/min nasal cannula to keep pt at goal of 90%- 92% O2 stats. Also, allowed pt to have as much crackers and peanut butter as desired with sips of water. Updated pt on continued course of care. Will continue to monitor pt.

## 2012-01-10 NOTE — ED Notes (Signed)
Reports given to Samantha, RN

## 2012-01-10 NOTE — ED Notes (Signed)
Dr.Lockwood notified of critical Na level.

## 2012-01-10 NOTE — H&P (Signed)
Internal Medicine Teaching Service Attending Note Date: 01/10/2012  Patient name: [name]  Medical record number: [id number]  Date of birth: April 06, 1950   I have seen and evaluated [name] and discussed their care with the Residency Team.   61 yo F with hx of bipolar, narcotic abuse, and COPD (no home O2, not on steroids, no previous intubations). She was seen in IM clinic 10-8 and treated for sinusitis with augmentin for 10 days. She now comes to ED with 2 days of worsening SOB and cough productive of green sputum. She had no f/c at home. She had difficulty sleeping/laying flat last PM to the point she had to sleep in a chair.     . methylPREDNISolone (SOLU-MEDROL) injection  125 mg Intravenous Once    Physical Exam: Blood pressure 160/80, pulse 85, temperature 98.4 F (36.9 C), temperature source Oral, resp. rate 25, SpO2 87.00%. General appearance: alert, cachectic and moderate distress Neck: using accesory muscles Lungs: rhonchi bilaterally Heart: tachycardic Abdomen: normal findings: bowel sounds normal and soft, non-tender Extremities: edema none  Lab results: Results for orders placed during the hospital encounter of 01/10/12 (from the past 24 hour(s))  CBC WITH DIFFERENTIAL     Status: Abnormal   Collection Time   01/10/12 10:17 AM      Component Value Range   WBC 13.0 (*) 4.0 - 10.5 K/uL   RBC 3.79 (*) 3.87 - 5.11 MIL/uL   Hemoglobin 12.0  12.0 - 15.0 g/dL   HCT 78.2 (*) 95.6 - 21.3 %   MCV 91.0  78.0 - 100.0 fL   MCH 31.7  26.0 - 34.0 pg   MCHC 34.8  30.0 - 36.0 g/dL   RDW 08.6  57.8 - 46.9 %   Platelets 346  150 - 400 K/uL   Neutrophils Relative 86 (*) 43 - 77 %   Neutro Abs 11.1 (*) 1.7 - 7.7 K/uL   Lymphocytes Relative 6 (*) 12 - 46 %   Lymphs Abs 0.8  0.7 - 4.0 K/uL   Monocytes Relative 8  3 - 12 %   Monocytes Absolute 1.1 (*) 0.1 - 1.0 K/uL   Eosinophils Relative 0  0 - 5 %   Eosinophils Absolute 0.0  0.0 - 0.7 K/uL   Basophils Relative 0  0 -  1 %   Basophils Absolute 0.0  0.0 - 0.1 K/uL  COMPREHENSIVE METABOLIC PANEL     Status: Abnormal   Collection Time   01/10/12 10:17 AM      Component Value Range   Sodium 117 (*) 135 - 145 mEq/L   Potassium 4.4  3.5 - 5.1 mEq/L   Chloride 81 (*) 96 - 112 mEq/L   CO2 24  19 - 32 mEq/L   Glucose, Bld 133 (*) 70 - 99 mg/dL   BUN 7  6 - 23 mg/dL   Creatinine, Ser 6.29 (*) 0.50 - 1.10 mg/dL   Calcium 7.9 (*) 8.4 - 10.5 mg/dL   Total Protein 6.8  6.0 - 8.3 g/dL   Albumin 3.4 (*) 3.5 - 5.2 g/dL   AST 528 (*) 0 - 37 U/L   ALT 166 (*) 0 - 35 U/L   Alkaline Phosphatase 258 (*) 39 - 117 U/L   Total Bilirubin 0.3  0.3 - 1.2 mg/dL   GFR calc non Af Amer >90  >90 mL/min   GFR calc Af Amer >90  >90 mL/min  PRO B NATRIURETIC PEPTIDE     Status:  Abnormal   Collection Time   01/10/12 10:25 AM      Component Value Range   Pro B Natriuretic peptide (BNP) 1969.0 (*) 0 - 125 pg/mL  LACTIC ACID, PLASMA     Status: Normal   Collection Time   01/10/12 10:25 AM      Component Value Range   Lactic Acid, Venous 1.0  0.5 - 2.2 mmol/L  POCT I-STAT 3, BLOOD GAS (G3P V)     Status: Abnormal   Collection Time   01/10/12 10:53 AM      Component Value Range   pH, Ven 7.259  7.250 - 7.300   pCO2, Ven 57.5 (*) 45.0 - 50.0 mmHg   pO2, Ven 122.0 (*) 30.0 - 45.0 mmHg   Bicarbonate 25.8 (*) 20.0 - 24.0 mEq/L   TCO2 28  0 - 100 mmol/L   O2 Saturation 98.0     Acid-base deficit 3.0 (*) 0.0 - 2.0 mmol/L   Sample type VENOUS    TROPONIN I     Status: Normal   Collection Time   01/10/12 12:01 PM      Component Value Range   Troponin I <0.30  <0.30 ng/mL  HEPATIC FUNCTION PANEL     Status: Abnormal   Collection Time   01/10/12 12:24 PM      Component Value Range   Total Protein 7.0  6.0 - 8.3 g/dL   Albumin 3.4 (*) 3.5 - 5.2 g/dL   AST 604 (*) 0 - 37 U/L   ALT 168 (*) 0 - 35 U/L   Alkaline Phosphatase 262 (*) 39 - 117 U/L   Total Bilirubin 0.2 (*) 0.3 - 1.2 mg/dL   Bilirubin, Direct <5.4  0.0 - 0.3 mg/dL    Indirect Bilirubin NOT CALCULATED  0.3 - 0.9 mg/dL  BASIC METABOLIC PANEL     Status: Abnormal   Collection Time   01/10/12 12:24 PM      Component Value Range   Sodium 118 (*) 135 - 145 mEq/L   Potassium 4.0  3.5 - 5.1 mEq/L   Chloride 80 (*) 96 - 112 mEq/L   CO2 25  19 - 32 mEq/L   Glucose, Bld 164 (*) 70 - 99 mg/dL   BUN 6  6 - 23 mg/dL   Creatinine, Ser 0.98 (*) 0.50 - 1.10 mg/dL   Calcium 7.9 (*) 8.4 - 10.5 mg/dL   GFR calc non Af Amer >90  >90 mL/min   GFR calc Af Amer >90  >90 mL/min    Imaging results:  Dg Chest Portable 1 View  01/10/2012  *RADIOLOGY REPORT*  Clinical Data: Severe COPD today, former smoking history  PORTABLE CHEST - 1 VIEW  Comparison: Chest x-ray of 10/11/2011  Findings: Severe changes of COPD are noted with marked hyperaeration.  There is some haziness at the left lung base and a patchy area of pneumonia cannot be excluded.  A lateral chest x-ray would be helpful.  No definite effusion is seen.  Mediastinal contours are stable.  The heart is within normal limits in size.  IMPRESSION:  1.  Severe COPD. 2.  Haziness at the left lung base.  Cannot exclude pneumonia. Recommend two-view chest x-ray of possible   Original Report Authenticated By: Juline Patch, M.D.     Assessment and Plan: I agree with the formulated Assessment and Plan with the following changes:   COPD LLL pneumonia  Would- start anbx (ceftriaxone and azithro)  nebulizers  Steroids  Close  monitoring of CO2.  BiPAP as needed  HypoNatremia  Check urine Na, serum and urine Osm  Hepatitis  Not clear source of this. Check hepatitis seroolgies, HIV  Could be due to fluid overload?  CHF?  BNP elevated but clinically she seems dry. CXR does not definitively say CHF...  Check TTE  Check cardiac enzymes   Ginnie Smart, MD 10/29/20132:25 PM

## 2012-01-10 NOTE — Progress Notes (Addendum)
Documented on pt in error

## 2012-01-10 NOTE — H&P (Signed)
[name] is an 61 y.o. female. HPI  [name] is a 61 yo woman with a history of COPD, HTN, and manic depressive disorder that presents with a 1 day history of increasing shortness of breath.  Per the patient she was fine 10/27 but woke up 10/28 with shortness of breath which worsened throughout the day progressing to orthopnea, she mentioned having to sleep in her chair last night due to dyspnea with lying flat.  Pt endorses productive cough (greenish sputum) starting 10/28.    Review of Systems  Constitutional: Positive for diaphoresis. Negative for fever and chills.  Respiratory: Positive for cough, sputum production, shortness of breath and wheezing.   Cardiovascular: Positive for orthopnea.  Neurological: Negative for dizziness.  Psychiatric/Behavioral: The patient is nervous/anxious.     Past Medical Hx:  Active Medical Problems - Past Medical History  Diagnosis Date  . Depression   . Hypertension   . Suicidal ideation 2007     attempted overdose 2012  Dr. Gwendalyn Ege report  . Bipolar disorder   . Substance abuse      narcotics, alcohol, tobacco  . COPD (chronic obstructive pulmonary disease)     not on home oxygen  . Chronic pain syndrome     follows at pain managment  . DEFICIENCY, VITAMIN D NOS 01/03/2007  . Shortness of breath     "all the time" (01/10/2012)  . Arthritis     "legs, back" (01/10/2012)  . Chronic lower back pain   . Anxiety    Home Medications -  Albuterol -Inhale 2 puffs into the lungs every 4 (four) hours as needed. For shortness of breath Tiotropium (SPIRIVA) 18 MCG inhalation capsule - 18 mcg inhaled daily Fluticasone-Salmeterol (ADVAIR) 250-50 MCG/DOSE AEPB - 1 puff into the lungs 2 (two) times daily  Clemastine (TAVIST) 2.68 MG TABS - Take 2.68 mg by mouth 2 (two) times daily. Fluticasone (FLONASE) 50 MCG/ACT nasal spray - 1 spray into the nose daily pseudoephedrine (SUDAFED) 60 MG tablet - 60 mg by mouth every 4 (four) hours as needed.  For congestion  Alendronate - 35 mg by mouth every 7 (seven) days Calcium Carbonate - 600 mg by mouth 2 (two) times daily  Duloxetine (CYMBALTA) 30 MG capsule - Take 30-60 mg by mouth daily. Takes 30 mg in the morning and 60 mg in the evenings Lamotrigine (LAMICTAL) 100 MG tablet - Take 100-200 mg by mouth 2 (two) times daily. 1 tablet in the morning and 2 tablets at night Olanzapine (ZYPREXA) 5 MG tablet - Take 5 mg by mouth at bedtime  Oxycodone-acetaminophen (PERCOCET) 10-325 MG per tablet - 1 tablet by mouth every 6 (six) hours as needed. For pain  Allergies -  Allergies  Allergen Reactions  . Banana Nausea And Vomiting   Surgeries -  Ankle Surgery - ORIF 11/16/2010  Family Hx: Negative for stroke Negative for cancer  Social Hx: Current smoker - 1/3 ppd Denies alcohol use Denies illicit drug use  Physical Exam  Vitals - Temp 36.9, BP 160/80, Pulse 85, Resp rate 22, SpO2 100 on BiPAP General - Cachectic appearing woman in bed in acute distress, BiPAP mask in place HEENT - NCAT CV - RRR, no murmurs, rubs, or clicks appreciated on auscultation, 2+ radial and dorsalis pedis pulses bilaterally Lungs - Increased work of breathing (accessory muscle use on both inhalation and exhalation), expiratory wheezes in all lung fields, no crackles heard, lungs tympanic to percussion in all fields Abdominal - Difficult to assess  as pt was tightening abdominal muscles significantly on expiration, +BS, no pain or tenderness to palpation, no masses felt Extremities - No signs of peripheral edema Neuro - Alert and oriented x 3, pt moved all extremities spontaneously, normal grip strength, 5/5 plantar flexion and dorsiflexion Psych - Appropriate for interaction  Labs: Results for orders placed during the hospital encounter of 01/10/12 (from the past 72 hour(s))  CBC WITH DIFFERENTIAL     Status: Abnormal   Collection Time   01/10/12 10:17 AM      Component Value Range Comment   WBC 13.0 (*)  4.0 - 10.5 K/uL    RBC 3.79 (*) 3.87 - 5.11 MIL/uL    Hemoglobin 12.0  12.0 - 15.0 g/dL    HCT 40.9 (*) 81.1 - 46.0 %    MCV 91.0  78.0 - 100.0 fL    MCH 31.7  26.0 - 34.0 pg    MCHC 34.8  30.0 - 36.0 g/dL    RDW 91.4  78.2 - 95.6 %    Platelets 346  150 - 400 K/uL    Neutrophils Relative 86 (*) 43 - 77 %    Neutro Abs 11.1 (*) 1.7 - 7.7 K/uL    Lymphocytes Relative 6 (*) 12 - 46 %    Lymphs Abs 0.8  0.7 - 4.0 K/uL    Monocytes Relative 8  3 - 12 %    Monocytes Absolute 1.1 (*) 0.1 - 1.0 K/uL    Eosinophils Relative 0  0 - 5 %    Eosinophils Absolute 0.0  0.0 - 0.7 K/uL    Basophils Relative 0  0 - 1 %    Basophils Absolute 0.0  0.0 - 0.1 K/uL   COMPREHENSIVE METABOLIC PANEL     Status: Abnormal   Collection Time   01/10/12 10:17 AM      Component Value Range Comment   Sodium 117 (*) 135 - 145 mEq/L    Potassium 4.4  3.5 - 5.1 mEq/L    Chloride 81 (*) 96 - 112 mEq/L    CO2 24  19 - 32 mEq/L    Glucose, Bld 133 (*) 70 - 99 mg/dL    BUN 7  6 - 23 mg/dL    Creatinine, Ser 2.13 (*) 0.50 - 1.10 mg/dL    Calcium 7.9 (*) 8.4 - 10.5 mg/dL    Total Protein 6.8  6.0 - 8.3 g/dL    Albumin 3.4 (*) 3.5 - 5.2 g/dL    AST 086 (*) 0 - 37 U/L    ALT 166 (*) 0 - 35 U/L    Alkaline Phosphatase 258 (*) 39 - 117 U/L    Total Bilirubin 0.3  0.3 - 1.2 mg/dL    GFR calc non Af Amer >90  >90 mL/min    GFR calc Af Amer >90  >90 mL/min   PRO B NATRIURETIC PEPTIDE     Status: Abnormal   Collection Time   01/10/12 10:25 AM      Component Value Range Comment   Pro B Natriuretic peptide (BNP) 1969.0 (*) 0 - 125 pg/mL   LACTIC ACID, PLASMA     Status: Normal   Collection Time   01/10/12 10:25 AM      Component Value Range Comment   Lactic Acid, Venous 1.0  0.5 - 2.2 mmol/L   POCT I-STAT 3, BLOOD GAS (G3P V)     Status: Abnormal   Collection Time   01/10/12 10:53 AM  Component Value Range Comment   pH, Ven 7.259  7.250 - 7.300    pCO2, Ven 57.5 (*) 45.0 - 50.0 mmHg    pO2, Ven 122.0 (*)  30.0 - 45.0 mmHg    Bicarbonate 25.8 (*) 20.0 - 24.0 mEq/L    TCO2 28  0 - 100 mmol/L    O2 Saturation 98.0      Acid-base deficit 3.0 (*) 0.0 - 2.0 mmol/L    Sample type VENOUS     TROPONIN I     Status: Normal   Collection Time   01/10/12 12:01 PM      Component Value Range Comment   Troponin I <0.30  <0.30 ng/mL   OSMOLALITY     Status: Abnormal   Collection Time   01/10/12 12:24 PM      Component Value Range Comment   Osmolality 248 (*) 275 - 300 mOsm/kg   HEPATIC FUNCTION PANEL     Status: Abnormal   Collection Time   01/10/12 12:24 PM      Component Value Range Comment   Total Protein 7.0  6.0 - 8.3 g/dL    Albumin 3.4 (*) 3.5 - 5.2 g/dL    AST 161 (*) 0 - 37 U/L    ALT 168 (*) 0 - 35 U/L    Alkaline Phosphatase 262 (*) 39 - 117 U/L    Total Bilirubin 0.2 (*) 0.3 - 1.2 mg/dL    Bilirubin, Direct <0.9  0.0 - 0.3 mg/dL    Indirect Bilirubin NOT CALCULATED  0.3 - 0.9 mg/dL   TSH     Status: Normal   Collection Time   01/10/12 12:24 PM      Component Value Range Comment   TSH 1.030  0.350 - 4.500 uIU/mL   BASIC METABOLIC PANEL     Status: Abnormal   Collection Time   01/10/12 12:24 PM      Component Value Range Comment   Sodium 118 (*) 135 - 145 mEq/L    Potassium 4.0  3.5 - 5.1 mEq/L    Chloride 80 (*) 96 - 112 mEq/L    CO2 25  19 - 32 mEq/L    Glucose, Bld 164 (*) 70 - 99 mg/dL    BUN 6  6 - 23 mg/dL    Creatinine, Ser 6.04 (*) 0.50 - 1.10 mg/dL    Calcium 7.9 (*) 8.4 - 10.5 mg/dL    GFR calc non Af Amer >90  >90 mL/min    GFR calc Af Amer >90  >90 mL/min   OSMOLALITY, URINE     Status: Normal   Collection Time   01/10/12  1:39 PM      Component Value Range Comment   Osmolality, Ur 464  390 - 1090 mOsm/kg   SODIUM, URINE, RANDOM     Status: Normal   Collection Time   01/10/12  1:39 PM      Component Value Range Comment   Sodium, Ur <10   RESULTS CONFIRMED BY MANUAL DILUTION  URINE RAPID DRUG SCREEN (HOSP PERFORMED)     Status: Normal   Collection Time    01/10/12  1:39 PM      Component Value Range Comment   Opiates NONE DETECTED  NONE DETECTED    Cocaine NONE DETECTED  NONE DETECTED    Benzodiazepines NONE DETECTED  NONE DETECTED    Amphetamines NONE DETECTED  NONE DETECTED    Tetrahydrocannabinol NONE DETECTED  NONE DETECTED    Barbiturates  NONE DETECTED  NONE DETECTED   TROPONIN I     Status: Normal   Collection Time   01/10/12  3:10 PM      Component Value Range Comment   Troponin I <0.30  <0.30 ng/mL   CK TOTAL AND CKMB     Status: Abnormal   Collection Time   01/10/12  3:10 PM      Component Value Range Comment   Total CK 408 (*) 7 - 177 U/L    CK, MB 16.2 (*) 0.3 - 4.0 ng/mL    Relative Index 4.0 (*) 0.0 - 2.5   MRSA PCR SCREENING     Status: Normal   Collection Time   01/10/12  3:22 PM      Component Value Range Comment   MRSA by PCR NEGATIVE  NEGATIVE     Imaging: Dg Chest Portable 1 View  01/10/2012  *RADIOLOGY REPORT*  Clinical Data: Severe COPD today, former smoking history  PORTABLE CHEST - 1 VIEW  Comparison: Chest x-ray of 10/11/2011  Findings: Severe changes of COPD are noted with marked hyperaeration.  There is some haziness at the left lung base and a patchy area of pneumonia cannot be excluded.  A lateral chest x-ray would be helpful.  No definite effusion is seen.  Mediastinal contours are stable.  The heart is within normal limits in size.  IMPRESSION:  1.  Severe COPD. 2.  Haziness at the left lung base.  Cannot exclude pneumonia. Recommend two-view chest x-ray of possible   Original Report Authenticated By: Juline Patch, M.D.     Assessment/Plan [name] is a 61 yo woman with a history of COPD, HTN, and multiple psychiatric issues on HD #0 being treated for an acute COPD exacerbation and hyponatremia.  Acute COPD Exacerbation:  Pt has a past history of COPD, shortness of breath came on suddenly with no associated fever or chills making pneumonia less likely.  Chest x-ray has been interpreted as severe  COPD but could not rule out pneumonia.  She meets 3 out of 3 cardinal criteria for a moderate to severe COPD exacerbation (increased dyspnea, increased sputum volume, sputum is purulent). -Scheduled nebulized albuterol -Scheduled ipratropium - anticholinergic  -Systemic corticosteroids -Empiric abx therapy - Ceftriaxone and Azithromycin -O2 therapy - keep O2 sat >92%; BiPAP prn if respiratory effort decreases -2 view chest x ray to further assess if pt has pneumonia  Hyponatremia:  Plasma osmolality is decreased at 248 indicating a hypotonic hyponatremia, pt appears euvolemic on clinical exam, and urine sodium is <10 suggesting kidneys are reclaiming maximal sodium a common reaction to decreased perfusion.  Could be a result of CHF and volume overload.  SIADH is also in the differential, she is a smoker and has lost 10% of body weight in the last year, however urine osmolality is 464 would expect it to be higher in SIADH.  CO2 is normal arguing against renal tubular acidosis and TSH is normal arguing against hypothyroidism, cannot rule out glucocorticoid deficiency.  She is also a very thin cachectic appearing woman, her weight has been trending down, and her albumin is slightly low at 3.4 making malnutrion a possibility however her total protein is normal arguing against decreased intake.  Medication side effects should also be considered, duloxetine is cautioned against in a pt with hyponatremia.   -Morning cortisol  Elevated Pro-BNP:  Pt does not appear clinically hypervolemia (no crackles on pulmonary exam, no peripheral edema, no significant findings on cardiovascular exam)  but Pro-BNP is increased to 1969 suggests volume overload.  CK-MB is elevated but troponin is normal making an acute coronary syndrome less likely, CK could have been increased due to increased respiratory muscle use -Defer treatment until we better understand her hyponatremia  HTN: Pt has a historical diagnosis of HTN but is  currently on no anti-hypertensives at home, pt presented with HTN but has since resolved -Not treating HTN because her BP's have been in the normal range for the last few hours -Monitor vital signs  Elevated Liver Function Tests: Two potential causes for this patient are inflammation from an infectious source and venous congestion from CHF or hepatic vein obstruction.  A biliary tract obstruction or insult is potentially a cause given her increased alkaline phosphatase but is less likely given her decreased bilirubin level.  Liver damage 2/2 alcohol is a potential cause but normally AST is greater than ALT.   -Hepatitis panel  Anxiety: Pt became claustrophobic with BiPAP mask on and was refusing to wear it -Lorazepam prn -O2 therapy via mask with BiPAP prn if respiratory effort decreases  Depression/Bipolar Disorder: -Continue home medications of Duloxetine, Lamotrigine, and Olanzapine   Frederik Pear 01/10/2012, 6:13 PM   Addendum to medical student admission note:  I have seen the patient and reviewed the admission note by Frederik Pear and discussed the care of the patient with him. See below for documentation of my findings, assessment, and plans.   Subjective:    Interval Events:  I agree with above documentation.  Progressive dyspnea for 24 hours with productive cough and purulent sputum.  Also reports some orthopnea but no dizziness, fevers, chills, or chest pain.    Objective:    Vital Signs:  Above vital signs were reviewed.   Physical Exam: GENERAL:  alert and oriented; moderate distress ENT:  moist mucosa NECK:  no JVD LUNGS:  wheezes throughout expiratory phase and in all lung fields; accessory muscle use; no rales HEART:  normal rate; regular rhythm, exam limited by lung sounds ABDOMEN:  soft, non-tender, normal bowel sounds, no masses palpated EXTREMITIES:  no edema SKIN:  normal turgor     Labs:  Above labs and radiographic studies were reviewed.     Assessment/ Plan:    I agree with the above assessment and plan.  Acute COPD exacerbation:  Treating with abx, steroids, and nebulized treatments.  Hyponatremia:  SIADH versus CHF.  AM cortisol ordered.  Acute on chronic respiratory failure:  Secondary to acute COPD exacerbation.  Requires non-rebreather and PRN bipap.  Elevated LFTs:  Viral hepatitis possible.  Normal bilirubin makes obstructive processes unlikely including choledocholithiasis, PSC, and cholangiocarcinoma.  Bilirubin levels can be normal early in PBC - consider ordering anti-mitochondrial antibody.    Length of Stay: 0 days   Signed by:  Dorthula Rue. Earlene Plater, MD PGY-I, Internal Medicine Pager [phone]  01/10/2012, 7:39 PM

## 2012-01-10 NOTE — Progress Notes (Addendum)
    1552 MD Earlene Plater assessed pt, informed nurse that pt will remain on non re-breather at this time with BIPAP PRN.  Nurse informed MD that pt complains of a cough and "feeling claustrophobic" with the mask on, MD will provide orders.  Nurse will continue to monitor.  Pt removed BIPAP mask and refused to place it back on face.  Nurse place non rebreather mask on pt; saturations 100.  Pt has increased work of breathing.  MD notified

## 2012-01-10 NOTE — Progress Notes (Signed)
CRITICAL VALUE ALERT  Critical value received:  CKMB 16.2  Date of notification:  01/10/12  Time of notification:  1600  Critical value read back:yes  Nurse who received alert:  Lora Havens  MD notified (1st page):  MD Earlene Plater  Time of first page:  1600  MD notified (2nd page):  Time of second page:  Responding MD:  MD Earlene Plater  Time MD responded:  [phone]

## 2012-01-10 NOTE — Progress Notes (Signed)
Utilization Review Completed.[name] T10/29/2013   

## 2012-01-10 NOTE — ED Notes (Signed)
Pt from home, c/o increasing SOB since yesterday, did own neb at home with no relief, now only exp wheezes, very tight, denies pain, fire had sats of 64% on room air

## 2012-01-11 ENCOUNTER — Inpatient Hospital Stay (HOSPITAL_COMMUNITY): Payer: Medicare Other

## 2012-01-11 LAB — GLUCOSE, CAPILLARY
Glucose-Capillary: 113 mg/dL — ABNORMAL HIGH (ref 70–99)
Glucose-Capillary: 107 mg/dL — ABNORMAL HIGH (ref 70–99)
Glucose-Capillary: 166 mg/dL — ABNORMAL HIGH (ref 70–99)

## 2012-01-11 LAB — HEPATITIS PANEL, ACUTE
HCV Ab: NEGATIVE
Hepatitis B Surface Ag: NEGATIVE

## 2012-01-11 LAB — BASIC METABOLIC PANEL
CO2: 26 mEq/L (ref 19–32)
Chloride: 89 mEq/L — ABNORMAL LOW (ref 96–112)
Glucose, Bld: 102 mg/dL — ABNORMAL HIGH (ref 70–99)
Potassium: 4.3 mEq/L (ref 3.5–5.1)
Sodium: 130 mEq/L — ABNORMAL LOW (ref 135–145)

## 2012-01-11 LAB — CORTISOL-AM, BLOOD: Cortisol - AM: 16.6 ug/dL (ref 4.3–22.4)

## 2012-01-11 LAB — CK TOTAL AND CKMB (NOT AT ARMC): Total CK: 431 U/L — ABNORMAL HIGH (ref 7–177)

## 2012-01-11 LAB — TROPONIN I: Troponin I: <0.3 ng/mL (ref <0.30)

## 2012-01-11 MED ORDER — ENOXAPARIN SODIUM 30 MG/0.3ML ~~LOC~~ SOLN
20.0000 mg | SUBCUTANEOUS | Status: DC
  Filled 2012-01-11 (×2): qty 0.2

## 2012-01-11 MED ORDER — PREDNISONE 50 MG PO TABS
60.0000 mg | ORAL_TABLET | Freq: Every day | ORAL | Status: AC
  Administered 2012-01-11: 60 mg via ORAL
  Filled 2012-01-11: qty 1

## 2012-01-11 MED ORDER — PREDNISONE 50 MG PO TABS
60.0000 mg | ORAL_TABLET | Freq: Every day | ORAL | Status: DC
  Administered 2012-01-12: 60 mg via ORAL
  Filled 2012-01-11 (×2): qty 1

## 2012-01-11 MED ORDER — INSULIN ASPART 100 UNIT/ML ~~LOC~~ SOLN: 0.0000 [IU] | Freq: Every day | SUBCUTANEOUS | Status: DC

## 2012-01-11 MED ORDER — IBUPROFEN 400 MG PO TABS
400.0000 mg | ORAL_TABLET | Freq: Four times a day (QID) | ORAL | Status: DC | PRN
  Administered 2012-01-11: 400 mg via ORAL
  Filled 2012-01-11 (×3): qty 1

## 2012-01-11 MED ORDER — INSULIN ASPART 100 UNIT/ML ~~LOC~~ SOLN
0.0000 [IU] | Freq: Three times a day (TID) | SUBCUTANEOUS | Status: DC
  Administered 2012-01-11: 7 [IU] via SUBCUTANEOUS
  Administered 2012-01-12: 2 [IU] via SUBCUTANEOUS

## 2012-01-11 MED ORDER — AZITHROMYCIN 250 MG PO TABS
250.0000 mg | ORAL_TABLET | ORAL | Status: DC
  Administered 2012-01-11: 250 mg via ORAL
  Filled 2012-01-11 (×2): qty 1

## 2012-01-11 MED ORDER — DEXTROSE 5 % IV SOLN
1.0000 g | INTRAVENOUS | Status: DC
  Administered 2012-01-11: 1 g via INTRAVENOUS
  Filled 2012-01-11 (×2): qty 10

## 2012-01-11 MED ORDER — ENOXAPARIN SODIUM 40 MG/0.4ML ~~LOC~~ SOLN
40.0000 mg | SUBCUTANEOUS | Status: DC
  Administered 2012-01-11: 40 mg via SUBCUTANEOUS
  Filled 2012-01-11: qty 0.4

## 2012-01-11 MED ORDER — ENOXAPARIN SODIUM 40 MG/0.4ML ~~LOC~~ SOLN: 40.0000 mg | SUBCUTANEOUS | Status: DC

## 2012-01-11 NOTE — Progress Notes (Signed)
Patient ID: [name], female   DOB: [birth year], 61 y.o.   MRN: [id number] Medical Student Daily Progress Note   Subjective:    Interval Events:  Pt had several episodes of hypotension and tachycardia overnight.  Per record pt was urinating throughout the night with an overall loss of >2 L, upon questioning pt reports that she normally drinks 10 or more 8 oz glasses of water daily and often urinates 7 or more times a day including nocturia.  Patient says she feels, "100% better" this morning with regards to her breathing.  Pt denies SOB but endorses productive cough that is worse than her baseline.  Pt says she feels like she has a fever stating, "When I get a fever my skin hurts and it is feeling really sensitive now."  She endorses a frontal throbbing headache and stiff neck which she contributes to poor sleep position.  Pt denies SOB, chills, chest pain, changes in vision, abdominal pain, weakness, numbness, or tingling.    Objective:    Vital Signs:   Temp:  [97.6 F (36.4 C)-99.5 F (37.5 C)] 98.8 F (37.1 C) (10/30 1218) Pulse Rate:  [79-114] 101  (10/30 1218) Resp:  [17-25] 22  (10/30 1218) BP: (89-135)/(43-72) 127/57 mmHg (10/30 1218) SpO2:  [93 %-100 %] 94 % (10/30 1218) FiO2 (%):  [6 %-100 %] 6 % (10/29 1738) Weight:  [39 kg (85 lb 15.7 oz)-40.2 kg (88 lb 10 oz)] 39 kg (85 lb 15.7 oz) (10/30 0500) Last BM Date: 01/10/12   Weights: 24-hour Weight change:   Filed Weights   01/10/12 1441 01/10/12 1512 01/11/12 0500  Weight: 40.2 kg (88 lb 10 oz) 40.2 kg (88 lb 10 oz) 39 kg (85 lb 15.7 oz)   Net since admission:  -1.2 kg   Intake/Output:   Intake/Output Summary (Last 24 hours) at 01/11/12 1334 Last data filed at 01/11/12 1215  Gross per 24 hour  Intake    390 ml  Output   3752 ml  Net  -3362 ml      Physical Exam: GENERAL:  alert and oriented; resting comfortably in bed and in no distress EYES:  pupils equal, round, and reactive to light; sclera  anicteric ENT:  moist mucosa, throat is clean and non-erythematous NECK:  no JVD, no pain with flexion LUNGS:  End expiratory wheezes in all lung fields, slightly increased work of breathing some accessory muscle use but markedly better than at admission HEART:  normal rate; regular rhythm; normal S1 and S2, no S3 or S4 appreciated; no murmurs, rubs, or clicks ABDOMEN:  soft, non-tender, normal bowel sounds, no masses palpated EXTREMITIES:  No peripheral edema, 2+ radial and dorsalis pedis pulses NEURO: EOMI, tongue protrudes midline, symmetric smile, symmetric eyebrow raise, normal tight eye closure bilaterally, normal facial and extremity sensation to light touch, 5/5 strength in dorsiflexion and plantar flexion bilaterally, good grip strength SKIN:  normal turgor, stage 1 decubitus noted at sacrum (non-blanchable 1x1 cm spot at top right of gluteal cleft), pt did not complain of pain with touching of any skin during the exam   Labs: Basic Metabolic Panel:  Lab 01/11/12 7829 01/10/12 2128 01/10/12 1224 01/10/12 1017  NA 130* 124* 118* 117*  K 4.3 4.5 4.0 4.4  CL 89* 84* 80* 81*  CO2 26 25 25 24   GLUCOSE 102* 94 164* 133*  BUN 7 6 6 7   CREATININE 0.37* 0.33* 0.31* 0.28*  CALCIUM 8.6 8.9 7.9* --  MG -- -- -- --  PHOS -- -- -- --    Liver Function Tests:  Lab 01/10/12 1224 01/10/12 1017  AST 133* 142*  ALT 168* 166*  ALKPHOS 262* 258*  BILITOT 0.2* 0.3  PROT 7.0 6.8  ALBUMIN 3.4* 3.4*    CBC:  Lab 01/10/12 1017  WBC 13.0*  NEUTROABS 11.1*  HGB 12.0  HCT 34.5*  MCV 91.0  PLT 346    Cardiac Enzymes:  Lab 01/11/12 0355 01/10/12 2128 01/10/12 1510 01/10/12 1201  CKTOTAL 431* 519* 408* --  CKMB 14.1* 18.4* 16.2* --  CKMBINDEX -- -- -- --  TROPONINI <0.30 <0.30 <0.30 <0.30    BNP (last 3 results):  Basename 01/10/12 1025  PROBNP 1969.0*    CBG:  Lab 01/11/12 1238 01/11/12 0757 01/10/12 2213  GLUCAP 107* 113* 102*   Cortisol AM: 16.6 (4.3 - 22.4 ug/dL  reference range)  Urine Osmolality: 464  FENa: 0.23% (Ur Na - <10, Ur Cr - 23.1, Pl Na - 130, Pl Cr - 0.37)  Microbiology: Results for orders placed during the hospital encounter of 01/10/12  CULTURE, BLOOD (ROUTINE X 2)     Status: Normal (Preliminary result)   Collection Time   01/10/12 10:30 AM      Component Value Range Status Comment   Specimen Description BLOOD LEFT ANTECUBITAL   Final    Special Requests BOTTLES DRAWN AEROBIC AND ANAEROBIC 10CC   Final    Culture  Setup Time 01/10/2012 19:43   Final    Culture     Final    Value:        BLOOD CULTURE RECEIVED NO GROWTH TO DATE CULTURE WILL BE HELD FOR 5 DAYS BEFORE ISSUING A FINAL NEGATIVE REPORT   Report Status PENDING   Incomplete   CULTURE, BLOOD (ROUTINE X 2)     Status: Normal (Preliminary result)   Collection Time   01/10/12 10:35 AM      Component Value Range Status Comment   Specimen Description BLOOD LEFT HAND   Final    Special Requests BOTTLES DRAWN AEROBIC AND ANAEROBIC 10CC   Final    Culture  Setup Time 01/10/2012 19:42   Final    Culture     Final    Value:        BLOOD CULTURE RECEIVED NO GROWTH TO DATE CULTURE WILL BE HELD FOR 5 DAYS BEFORE ISSUING A FINAL NEGATIVE REPORT   Report Status PENDING   Incomplete   MRSA PCR SCREENING     Status: Normal   Collection Time   01/10/12  3:22 PM      Component Value Range Status Comment   MRSA by PCR NEGATIVE  NEGATIVE Final    HIV testing: NON REACTIVE (10/29 1510)  Imaging: Dg Chest 2 View  01/11/2012  *RADIOLOGY REPORT*  Clinical Data: COPD exacerbation, possible left lower lobe pneumonia  CHEST - 2 VIEW  Comparison: 01/10/2012  Findings: Emphysematous changes/hyperinflation.  Patchy retrocardiac opacity on the lateral view, although less conspicuous on the current frontal view, suspicious for left lower lobe pneumonia.  No pleural effusion or pneumothorax.  Possible nodular opacity in the medial right upper lobe, overlying the posterior sixth rib.   Cardiomediastinal silhouette is within normal limits.  Mild degenerative changes of the visualized thoracolumbar spine.  IMPRESSION: Patchy left lower lobe opacity, suspicious for pneumonia.  Possible nodular opacity overlying the medial right upper lobe. While this may reflect superimposition of osseous structures, consider CT with contrast for further evaluation.   Original Report  Authenticated By: Charline Bills, M.D.    Dg Chest Portable 1 View  01/10/2012  *RADIOLOGY REPORT*  Clinical Data: Severe COPD today, former smoking history  PORTABLE CHEST - 1 VIEW  Comparison: Chest x-ray of 10/11/2011  Findings: Severe changes of COPD are noted with marked hyperaeration.  There is some haziness at the left lung base and a patchy area of pneumonia cannot be excluded.  A lateral chest x-ray would be helpful.  No definite effusion is seen.  Mediastinal contours are stable.  The heart is within normal limits in size.  IMPRESSION:  1.  Severe COPD. 2.  Haziness at the left lung base.  Cannot exclude pneumonia. Recommend two-view chest x-ray of possible   Original Report Authenticated By: Juline Patch, M.D.       Medications:    Infusions:    . DISCONTD: albuterol 10 mg (01/10/12 1025)  . DISCONTD: ipratropium 0.5 mg (01/10/12 1055)    Scheduled Medications:    . albuterol  2.5 mg Nebulization Q4H   And  . ipratropium  0.5 mg Nebulization Q4H  . antiseptic oral rinse  15 mL Mouth Rinse q12n4p  . azithromycin  250 mg Intravenous Q24H  . calcium carbonate  1,250 mg Oral BID  . chlorhexidine  15 mL Mouth Rinse BID  . cholecalciferol  1,000 Units Oral Daily  . DULoxetine  30 mg Oral Daily  . DULoxetine  60 mg Oral QHS  . enoxaparin (LOVENOX) injection  40 mg Subcutaneous Q24H  . fluticasone  1 spray Each Nare Daily  . lamoTRIgine  100 mg Oral Daily  . lamoTRIgine  200 mg Oral QHS  . loratadine  10 mg Oral Daily  . methylPREDNISolone (SOLU-MEDROL) injection  60 mg Intravenous BID  .  OLANZapine  5 mg Oral QHS  . DISCONTD: albuterol  10 mg Nebulization Q4H  . DISCONTD: azithromycin (ZITHROMAX) 500 MG IVPB  500 mg Intravenous Daily  . DISCONTD: calcium carbonate  600 mg Oral BID  . DISCONTD: cefTRIAXone (ROCEPHIN)  IV  1 g Intravenous Q24H  . DISCONTD: enoxaparin (LOVENOX) injection  40 mg Subcutaneous Q24H  . DISCONTD: furosemide  20 mg Intravenous Once  . DISCONTD: ipratropium  0.5 mg Nebulization Q4H  . DISCONTD: mometasone-formoterol  2 puff Inhalation BID     PRN Medications: albuterol, albuterol, benzonatate, ipratropium, LORazepam, ondansetron (ZOFRAN) IV, oxyCODONE, oxyCODONE-acetaminophen, DISCONTD: oxyCODONE-acetaminophen    Assessment/ Plan:    Assessment/Plan  [name] is a 61 yo woman with a history of COPD, HTN, and multiple psychiatric issues on HD #1 being treated for an acute COPD exacerbation and hyponatremia.   Acute COPD Exacerbation: Pt has a past history of COPD, shortness of breath came on suddenly with no associated fever or chills making pneumonia less likely. Chest x-ray has been interpreted as severe COPD but could not rule out pneumonia. She met 3 out of 3 cardinal criteria for a moderate to severe COPD exacerbation at admission (increased dyspnea, increased sputum volume, sputum is purulent).  -Scheduled nebulized albuterol  -Scheduled ipratropium - anticholinergic  -Systemic corticosteroids  -Abx therapy - azithromycin Day #2  -O2 therapy - currently on 1 L/min via nasal canula, keep O2 sat >92%; BiPAP prn if respiratory effort decreases  -Scheduled loratadine  -Repeat CXR is suspicious for pneumonia will continue current abx therapy, given clinical improvement no need for CT at this time -Goal is return her to baseline of no supplementary O2 and ambulatory/able perform ADL's independently  Hyponatremia:  Na  has increased today 130, after seeing her output through the night and high water intake from history, 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  -Morning cortisol was normal making adrenal insufficiency less likely -Pt continues to appear euvolemic on exam -Continue to monitor BMET  -Anticipate resolution with normal urination and reduced fluid intake  Elevated Pro-BNP: Pt does not appear clinically hypervolemia (no crackles on pulmonary exam, no peripheral edema, no significant findings on cardiovascular exam) but Pro-BNP is increased to 1969 suggests volume overload. CK-MB is still elevated but trending down troponin is still normal making an acute coronary syndrome less likely, CK could have been increased due to increased respiratory muscle use.  Given increased likelyhood of primary polydipsia increased intravascular volume would explain increased Pro-BNP.  -No treatment needed   Hypotension/Tachycardia: Likely secondary to increased diuresis after polydipsia, tachycardia could also be a side effect of her bronchodilator and corticosteroid therapies -Despite lack of evidence on clinical exam increased Pro-BNP suggests fluid overload -Pt is taking PO well -Likely to resolve without intervention as she recovers from fluid overload and COPD exacerbation  Stage 1 Decubitus: On physical exam non-blanchable 1x1 cm area noted at sacrum -Offload the patient with frequent turns, air mattress, or other device -Cover ulcer with bandage -Maintain nutrition -Monitor for skin breakdown  HTN: Pt has a historical diagnosis of HTN but is currently on no anti-hypertensives at home, pt presented with HTN but has since resolved.  Pt was actually HYPOtensive overnight -Not treating HTN because her BP's have been in the normal or low range since admission  -Monitor vital signs per floor routine  Elevated Liver Function Tests: Two potential causes for this patient are inflammation from an infectious source and venous congestion from CHF or hepatic vein obstruction. A biliary tract obstruction or insult is  potentially a cause given her increased alkaline phosphatase but is less likely given her decreased bilirubin level. Liver damage 2/2 alcohol is a potential cause but normally AST is greater than ALT.  -Hepatitis panel pending -HIV non reactive  Anxiety: Pt became claustrophobic with BiPAP mask on and was refusing to wear it  -Don't anticipate issues as patient's saturations are stable on nasal canula -Lorazepam prn  -O2 therapy via mask with BiPAP prn if respiratory effort decreases   Depression/Bipolar Disorder:  -Continue home medications of Duloxetine, Lamotrigine, and Olanzapine  VTE Prophylaxis: -Scheduled enoxaparin  Diet: -Regular diet  Dispo: -Plan to discharge back to home at [facility] once pt has returned to baseline (no supplemental O2, ambulatory, able to perform ADLs)      Length of Stay: 1 days   This is a Psychologist, occupational Note.  The care of the patient was discussed with Dr. Earlene Plater and the assessment and plan formulated with their assistance.  Please see their attached note or addendum for official documentation of the daily encounter.  Addendum to medical student daily progress note:  I have seen the patient and reviewed the daily progress note by Frederik Pear and discussed the care of the patient with him. See below for documentation of my findings, assessment, and plans.   Subjective:    Interval Events:  I agree with above documentation.  Much improved.  Able to tolerate PO now.      Objective:    Vital Signs:  Above vital signs were reviewed.   Physical Exam: GENERAL:  alert and oriented; resting comfortably in bed and in no distress ENT:  moist mucosa LUNGS:  End-expiratory wheezes auscultated in all lung fields, normal work of breathing HEART:  normal rate; regular rhythm; normal S1 and S2, no S3 or S4 appreciated; no murmurs, rubs, or clicks     Labs:  Above labs and radiographic studies were reviewed.    Assessment/ Plan:    I agree  with the above assessment and plan.  1.   Acute COPD exacerbation and acute on chronic respiratory failure:  Transitioned to oral prednisone and we anticipate a 12 day taper of this.  Still on PO azithromycin and IV ceftriaxone; anticipate transition to PO abx tomorrow.  Will space out nebulized treatments tomorrow and can d/c home once she is weaned off of oxygen and scheduled nebs.  2.   Hyponatremia:  Very mixed picture.  Clinical picture does not support CHF.  Laboratory studies do not completely fit with SIADH or psychogenic polydipsia.  It is, however, improving.  TSH and morning cortisol were normal.  3.   Elevated LFTs:  We will obtain an abdominal ultrasound after repeating LFTs tomorrow.  This will likely be deferred to outpatient arena.    Length of Stay: 1 days   Signed by:  Dorthula Rue. Earlene Plater, MD PGY-I, Internal Medicine Pager [phone]  01/11/2012, 5:55 PM

## 2012-01-11 NOTE — Progress Notes (Signed)
Pt is now off bipap and saturations are at 94% on 1lpm .  bipap was removed from room today.

## 2012-01-11 NOTE — Progress Notes (Signed)
Internal Medicine Teaching Service Attending Note Date: 01/11/2012  Patient name: [name]  Medical record number: [id number]  Date of birth: August 31, 1950   I have seen and evaluated [name] and discussed their care with the Residency Team.   Feels better. No CP. Continued cough.     Marland Kitchen albuterol  2.5 mg Nebulization Q4H   And  . ipratropium  0.5 mg Nebulization Q4H  . antiseptic oral rinse  15 mL Mouth Rinse q12n4p  . azithromycin  250 mg Intravenous Q24H  . azithromycin (ZITHROMAX) 500 MG IVPB  500 mg Intravenous Once  . calcium carbonate  1,250 mg Oral BID  . chlorhexidine  15 mL Mouth Rinse BID  . cholecalciferol  1,000 Units Oral Daily  . DULoxetine  30 mg Oral Daily  . DULoxetine  60 mg Oral QHS  . enoxaparin (LOVENOX) injection  40 mg Subcutaneous Q24H  . fluticasone  1 spray Each Nare Daily  . lamoTRIgine  100 mg Oral Daily  . lamoTRIgine  200 mg Oral QHS  . loratadine  10 mg Oral Daily  . methylPREDNISolone (SOLU-MEDROL) injection  125 mg Intravenous Once  . methylPREDNISolone (SOLU-MEDROL) injection  60 mg Intravenous BID  . OLANZapine  5 mg Oral QHS  . DISCONTD: albuterol  10 mg Nebulization Q4H  . DISCONTD: azithromycin (ZITHROMAX) 500 MG IVPB  500 mg Intravenous Daily  . DISCONTD: calcium carbonate  600 mg Oral BID  . DISCONTD: cefTRIAXone (ROCEPHIN)  IV  1 g Intravenous Q24H  . DISCONTD: enoxaparin (LOVENOX) injection  40 mg Subcutaneous Q24H  . DISCONTD: furosemide  20 mg Intravenous Once  . DISCONTD: ipratropium  0.5 mg Nebulization Q4H  . DISCONTD: mometasone-formoterol  2 puff Inhalation BID    Physical Exam: Blood pressure 110/54, pulse 90, temperature 98.2 F (36.8 C), temperature source Oral, resp. rate 21, height 5\' 1"  (1.549 m), weight 39 kg (85 lb 15.7 oz), SpO2 99.00%. General appearance: alert, cooperative, cachectic and no distress Neck: JVD - several cm above sternal notch and supple, symmetrical, trachea midline Resp: rhonchi  bilaterally and wheezes bilaterally Cardio: tachycardia GI: normal findings: bowel sounds normal and soft, non-tender Extremities: edema none  Lab results: Results for orders placed during the hospital encounter of 01/10/12 (from the past 24 hour(s))  CBC WITH DIFFERENTIAL     Status: Abnormal   Collection Time   01/10/12 10:17 AM      Component Value Range   WBC 13.0 (*) 4.0 - 10.5 K/uL   RBC 3.79 (*) 3.87 - 5.11 MIL/uL   Hemoglobin 12.0  12.0 - 15.0 g/dL   HCT 40.9 (*) 81.1 - 91.4 %   MCV 91.0  78.0 - 100.0 fL   MCH 31.7  26.0 - 34.0 pg   MCHC 34.8  30.0 - 36.0 g/dL   RDW 78.2  95.6 - 21.3 %   Platelets 346  150 - 400 K/uL   Neutrophils Relative 86 (*) 43 - 77 %   Neutro Abs 11.1 (*) 1.7 - 7.7 K/uL   Lymphocytes Relative 6 (*) 12 - 46 %   Lymphs Abs 0.8  0.7 - 4.0 K/uL   Monocytes Relative 8  3 - 12 %   Monocytes Absolute 1.1 (*) 0.1 - 1.0 K/uL   Eosinophils Relative 0  0 - 5 %   Eosinophils Absolute 0.0  0.0 - 0.7 K/uL   Basophils Relative 0  0 - 1 %   Basophils Absolute 0.0  0.0 - 0.1  K/uL  COMPREHENSIVE METABOLIC PANEL     Status: Abnormal   Collection Time   01/10/12 10:17 AM      Component Value Range   Sodium 117 (*) 135 - 145 mEq/L   Potassium 4.4  3.5 - 5.1 mEq/L   Chloride 81 (*) 96 - 112 mEq/L   CO2 24  19 - 32 mEq/L   Glucose, Bld 133 (*) 70 - 99 mg/dL   BUN 7  6 - 23 mg/dL   Creatinine, Ser 1.47 (*) 0.50 - 1.10 mg/dL   Calcium 7.9 (*) 8.4 - 10.5 mg/dL   Total Protein 6.8  6.0 - 8.3 g/dL   Albumin 3.4 (*) 3.5 - 5.2 g/dL   AST 829 (*) 0 - 37 U/L   ALT 166 (*) 0 - 35 U/L   Alkaline Phosphatase 258 (*) 39 - 117 U/L   Total Bilirubin 0.3  0.3 - 1.2 mg/dL   GFR calc non Af Amer >90  >90 mL/min   GFR calc Af Amer >90  >90 mL/min  PRO B NATRIURETIC PEPTIDE     Status: Abnormal   Collection Time   01/10/12 10:25 AM      Component Value Range   Pro B Natriuretic peptide (BNP) 1969.0 (*) 0 - 125 pg/mL  LACTIC ACID, PLASMA     Status: Normal   Collection Time    01/10/12 10:25 AM      Component Value Range   Lactic Acid, Venous 1.0  0.5 - 2.2 mmol/L  CULTURE, BLOOD (ROUTINE X 2)     Status: Normal (Preliminary result)   Collection Time   01/10/12 10:30 AM      Component Value Range   Specimen Description BLOOD LEFT ANTECUBITAL     Special Requests BOTTLES DRAWN AEROBIC AND ANAEROBIC 10CC     Culture  Setup Time 01/10/2012 19:43     Culture       Value:        BLOOD CULTURE RECEIVED NO GROWTH TO DATE CULTURE WILL BE HELD FOR 5 DAYS BEFORE ISSUING A FINAL NEGATIVE REPORT   Report Status PENDING    CULTURE, BLOOD (ROUTINE X 2)     Status: Normal (Preliminary result)   Collection Time   01/10/12 10:35 AM      Component Value Range   Specimen Description BLOOD LEFT HAND     Special Requests BOTTLES DRAWN AEROBIC AND ANAEROBIC 10CC     Culture  Setup Time 01/10/2012 19:42     Culture       Value:        BLOOD CULTURE RECEIVED NO GROWTH TO DATE CULTURE WILL BE HELD FOR 5 DAYS BEFORE ISSUING A FINAL NEGATIVE REPORT   Report Status PENDING    POCT I-STAT 3, BLOOD GAS (G3P V)     Status: Abnormal   Collection Time   01/10/12 10:53 AM      Component Value Range   pH, Ven 7.259  7.250 - 7.300   pCO2, Ven 57.5 (*) 45.0 - 50.0 mmHg   pO2, Ven 122.0 (*) 30.0 - 45.0 mmHg   Bicarbonate 25.8 (*) 20.0 - 24.0 mEq/L   TCO2 28  0 - 100 mmol/L   O2 Saturation 98.0     Acid-base deficit 3.0 (*) 0.0 - 2.0 mmol/L   Sample type VENOUS    TROPONIN I     Status: Normal   Collection Time   01/10/12 12:01 PM      Component Value Range  Troponin I <0.30  <0.30 ng/mL  OSMOLALITY     Status: Abnormal   Collection Time   01/10/12 12:24 PM      Component Value Range   Osmolality 248 (*) 275 - 300 mOsm/kg  HEPATIC FUNCTION PANEL     Status: Abnormal   Collection Time   01/10/12 12:24 PM      Component Value Range   Total Protein 7.0  6.0 - 8.3 g/dL   Albumin 3.4 (*) 3.5 - 5.2 g/dL   AST 147 (*) 0 - 37 U/L   ALT 168 (*) 0 - 35 U/L   Alkaline Phosphatase  262 (*) 39 - 117 U/L   Total Bilirubin 0.2 (*) 0.3 - 1.2 mg/dL   Bilirubin, Direct <8.2  0.0 - 0.3 mg/dL   Indirect Bilirubin NOT CALCULATED  0.3 - 0.9 mg/dL  TSH     Status: Normal   Collection Time   01/10/12 12:24 PM      Component Value Range   TSH 1.030  0.350 - 4.500 uIU/mL  BASIC METABOLIC PANEL     Status: Abnormal   Collection Time   01/10/12 12:24 PM      Component Value Range   Sodium 118 (*) 135 - 145 mEq/L   Potassium 4.0  3.5 - 5.1 mEq/L   Chloride 80 (*) 96 - 112 mEq/L   CO2 25  19 - 32 mEq/L   Glucose, Bld 164 (*) 70 - 99 mg/dL   BUN 6  6 - 23 mg/dL   Creatinine, Ser 9.56 (*) 0.50 - 1.10 mg/dL   Calcium 7.9 (*) 8.4 - 10.5 mg/dL   GFR calc non Af Amer >90  >90 mL/min   GFR calc Af Amer >90  >90 mL/min  OSMOLALITY, URINE     Status: Normal   Collection Time   01/10/12  1:39 PM      Component Value Range   Osmolality, Ur 464  390 - 1090 mOsm/kg  SODIUM, URINE, RANDOM     Status: Normal   Collection Time   01/10/12  1:39 PM      Component Value Range   Sodium, Ur <10    URINE RAPID DRUG SCREEN (HOSP PERFORMED)     Status: Normal   Collection Time   01/10/12  1:39 PM      Component Value Range   Opiates NONE DETECTED  NONE DETECTED   Cocaine NONE DETECTED  NONE DETECTED   Benzodiazepines NONE DETECTED  NONE DETECTED   Amphetamines NONE DETECTED  NONE DETECTED   Tetrahydrocannabinol NONE DETECTED  NONE DETECTED   Barbiturates NONE DETECTED  NONE DETECTED  HIV ANTIBODY (ROUTINE TESTING)     Status: Normal   Collection Time   01/10/12  3:10 PM      Component Value Range   HIV NON REACTIVE  NON REACTIVE  TROPONIN I     Status: Normal   Collection Time   01/10/12  3:10 PM      Component Value Range   Troponin I <0.30  <0.30 ng/mL  CK TOTAL AND CKMB     Status: Abnormal   Collection Time   01/10/12  3:10 PM      Component Value Range   Total CK 408 (*) 7 - 177 U/L   CK, MB 16.2 (*) 0.3 - 4.0 ng/mL   Relative Index 4.0 (*) 0.0 - 2.5  MRSA PCR SCREENING      Status: Normal   Collection Time  01/10/12  3:22 PM      Component Value Range   MRSA by PCR NEGATIVE  NEGATIVE  CREATININE, URINE, RANDOM     Status: Normal   Collection Time   01/10/12  5:30 PM      Component Value Range   Creatinine, Urine 23.10    TROPONIN I     Status: Normal   Collection Time   01/10/12  9:28 PM      Component Value Range   Troponin I <0.30  <0.30 ng/mL  CK TOTAL AND CKMB     Status: Abnormal   Collection Time   01/10/12  9:28 PM      Component Value Range   Total CK 519 (*) 7 - 177 U/L   CK, MB 18.4 (*) 0.3 - 4.0 ng/mL   Relative Index 3.5 (*) 0.0 - 2.5  BASIC METABOLIC PANEL     Status: Abnormal   Collection Time   01/10/12  9:28 PM      Component Value Range   Sodium 124 (*) 135 - 145 mEq/L   Potassium 4.5  3.5 - 5.1 mEq/L   Chloride 84 (*) 96 - 112 mEq/L   CO2 25  19 - 32 mEq/L   Glucose, Bld 94  70 - 99 mg/dL   BUN 6  6 - 23 mg/dL   Creatinine, Ser 2.95 (*) 0.50 - 1.10 mg/dL   Calcium 8.9  8.4 - 62.1 mg/dL   GFR calc non Af Amer >90  >90 mL/min   GFR calc Af Amer >90  >90 mL/min  GLUCOSE, CAPILLARY     Status: Abnormal   Collection Time   01/10/12 10:13 PM      Component Value Range   Glucose-Capillary 102 (*) 70 - 99 mg/dL   Comment 1 Notify RN     Comment 2 Documented in Chart    TROPONIN I     Status: Normal   Collection Time   01/11/12  3:55 AM      Component Value Range   Troponin I <0.30  <0.30 ng/mL  CK TOTAL AND CKMB     Status: Abnormal   Collection Time   01/11/12  3:55 AM      Component Value Range   Total CK 431 (*) 7 - 177 U/L   CK, MB 14.1 (*) 0.3 - 4.0 ng/mL   Relative Index 3.3 (*) 0.0 - 2.5  BASIC METABOLIC PANEL     Status: Abnormal   Collection Time   01/11/12  3:55 AM      Component Value Range   Sodium 130 (*) 135 - 145 mEq/L   Potassium 4.3  3.5 - 5.1 mEq/L   Chloride 89 (*) 96 - 112 mEq/L   CO2 26  19 - 32 mEq/L   Glucose, Bld 102 (*) 70 - 99 mg/dL   BUN 7  6 - 23 mg/dL   Creatinine, Ser 3.08 (*)  0.50 - 1.10 mg/dL   Calcium 8.6  8.4 - 65.7 mg/dL   GFR calc non Af Amer >90  >90 mL/min   GFR calc Af Amer >90  >90 mL/min    Imaging results:  Dg Chest 2 View  01/11/2012  *RADIOLOGY REPORT*  Clinical Data: COPD exacerbation, possible left lower lobe pneumonia  CHEST - 2 VIEW  Comparison: 01/10/2012  Findings: Emphysematous changes/hyperinflation.  Patchy retrocardiac opacity on the lateral view, although less conspicuous on the current frontal view, suspicious for left lower lobe pneumonia.  No pleural effusion or pneumothorax.  Possible nodular opacity in the medial right upper lobe, overlying the posterior sixth rib.  Cardiomediastinal silhouette is within normal limits.  Mild degenerative changes of the visualized thoracolumbar spine.  IMPRESSION: Patchy left lower lobe opacity, suspicious for pneumonia.  Possible nodular opacity overlying the medial right upper lobe. While this may reflect superimposition of osseous structures, consider CT with contrast for further evaluation.   Original Report Authenticated By: Charline Bills, M.D.    Dg Chest Portable 1 View  01/10/2012  *RADIOLOGY REPORT*  Clinical Data: Severe COPD today, former smoking history  PORTABLE CHEST - 1 VIEW  Comparison: Chest x-ray of 10/11/2011  Findings: Severe changes of COPD are noted with marked hyperaeration.  There is some haziness at the left lung base and a patchy area of pneumonia cannot be excluded.  A lateral chest x-ray would be helpful.  No definite effusion is seen.  Mediastinal contours are stable.  The heart is within normal limits in size.  IMPRESSION:  1.  Severe COPD. 2.  Haziness at the left lung base.  Cannot exclude pneumonia. Recommend two-view chest x-ray of possible   Original Report Authenticated By: Juline Patch, M.D.     Assessment and Plan: I agree with the formulated Assessment and Plan with the following changes:   COPD LLL PNA (day 2 ceftriaxone/azithro) hypoNatremia Hepatitis Elevated  CK/MB   CHF? BNP 1969  Na better F/u TTE BCx are NGTD. Consider CV eval.  continue steroids Tele bed?

## 2012-01-11 NOTE — ED Provider Notes (Signed)
  I performed a history and physical examination of [name] and discussed her management with Dr. Gregary Cromer.  I agree with the history, physical, assessment, and plan of care, with the following exceptions: None  I saw the ECG, relevant labs and studies - I agree with the interpretation.   Elyse Jarvis, MD 01/11/12 [phone]

## 2012-01-11 NOTE — Progress Notes (Signed)
CRITICAL VALUE ALERT  Critical value received: CK-MB 18.4  Date of notification:  Viewed by Servando Salina, RN 01/11/12  Time of notification: No notification by Lab  Critical value read back:no  Nurse who received alert:  Viewed by Suzan Slick   MD notified: Dr. Collier Bullock  Time of first page: [phone]  MD notified (2nd page):  Time of second page:  Responding MD: Koleen Distance  Time MD responded:  [phone]

## 2012-01-11 NOTE — Progress Notes (Signed)
CRITICAL VALUE ALERT  Critical value received: CK-MB 14.1  Date of notification:  Viewed by Laveda Norman Gaelle Adriance,RN 01/11/12  Time of notification: No notification by Lab  Critical value read back:no  Nurse who received alert:  Viewed by Suzan Slick  MD notified (1st page):  Dr. Collier Bullock  Time of first page: [phone]  MD notified (2nd page):  Time of second page:  Responding MD: [facility]  Time MD responded:  [id number]  [id number]: Notified MD of no order for VTE prophylaxis. No new orders received.

## 2012-01-12 ENCOUNTER — Inpatient Hospital Stay (HOSPITAL_COMMUNITY): Payer: Medicare Other

## 2012-01-12 LAB — BASIC METABOLIC PANEL
BUN: 11 mg/dL (ref 6–23)
CO2: 29 mEq/L (ref 19–32)
Calcium: 9.1 mg/dL (ref 8.4–10.5)
Creatinine, Ser: 0.39 mg/dL — ABNORMAL LOW (ref 0.50–1.10)
GFR calc non Af Amer: >90 mL/min (ref >90)
Glucose, Bld: 146 mg/dL — ABNORMAL HIGH (ref 70–99)

## 2012-01-12 LAB — HEPATIC FUNCTION PANEL
ALT: 86 U/L — ABNORMAL HIGH (ref 0–35)
AST: 37 U/L (ref 0–37)
Albumin: 3.1 g/dL — ABNORMAL LOW (ref 3.5–5.2)
Alkaline Phosphatase: 186 U/L — ABNORMAL HIGH (ref 39–117)
Bilirubin, Direct: <0.1 mg/dL (ref 0.0–0.3)
Total Bilirubin: 0.2 mg/dL — ABNORMAL LOW (ref 0.3–1.2)
Total Protein: 6.5 g/dL (ref 6.0–8.3)

## 2012-01-12 LAB — GLUCOSE, CAPILLARY: Glucose-Capillary: 154 mg/dL — ABNORMAL HIGH (ref 70–99)

## 2012-01-12 MED ORDER — LEVOFLOXACIN 750 MG PO TABS
750.0000 mg | ORAL_TABLET | Freq: Every day | ORAL | Status: DC
  Administered 2012-01-12: 750 mg via ORAL
  Filled 2012-01-12: qty 1

## 2012-01-12 MED ORDER — ENOXAPARIN SODIUM 30 MG/0.3ML ~~LOC~~ SOLN
20.0000 mg | SUBCUTANEOUS | Status: DC
  Administered 2012-01-12: 20 mg via SUBCUTANEOUS
  Filled 2012-01-12: qty 0.2

## 2012-01-12 MED ORDER — PREDNISONE 20 MG PO TABS: ORAL_TABLET | ORAL | Status: DC

## 2012-01-12 MED ORDER — PREDNISONE 20 MG PO TABS: ORAL_TABLET | ORAL | Status: AC

## 2012-01-12 MED ORDER — LEVOFLOXACIN 750 MG PO TABS: 750.0000 mg | ORAL_TABLET | Freq: Every day | ORAL | Status: DC

## 2012-01-12 MED ORDER — ENOXAPARIN SODIUM 30 MG/0.3ML ~~LOC~~ SOLN
20.0000 mg | SUBCUTANEOUS | Status: DC
  Filled 2012-01-12: qty 0.2

## 2012-01-12 MED ORDER — LEVOFLOXACIN 750 MG PO TABS: 750.0000 mg | ORAL_TABLET | Freq: Every day | ORAL | Status: AC

## 2012-01-12 NOTE — Progress Notes (Signed)
Pt off oxygen saturation 90-92 percent sitting calmly but drops to 87-88% with eating, talking or minimal movement in bed. Denies feeling SOB.

## 2012-01-12 NOTE — Progress Notes (Addendum)
Pt sleeping oxygen saturation on RA 88%.  Pt ambulated down hall to nurses station 154feet and back.  Gait steady but c/o dizziness at nurses station.  Oxyen saturation slowly dropped to 79% by end of walk.  Remained in 80s until oxygen placed back on pt for several minutes.  Harsh cough noted, pt states bringing up tan sputum.   Pt oxygen saturation increased to 88% after being on the 1L cannula for 1 minute and 93% after 2 minutes.  Pt states dizziness has passed.

## 2012-01-12 NOTE — Progress Notes (Signed)
Internal Medicine Teaching Service Attending Note Date: 01/12/2012  Patient name: [name]  Medical record number: [id number]  Date of birth: 1950-05-01   I have seen and evaluated [name] and discussed their care with the Residency Team.   Feels better. O2 turned off and pt maintains SPO2 in the mid 90s    . antiseptic oral rinse  15 mL Mouth Rinse q12n4p  . calcium carbonate  1,250 mg Oral BID  . chlorhexidine  15 mL Mouth Rinse BID  . cholecalciferol  1,000 Units Oral Daily  . DULoxetine  30 mg Oral Daily  . DULoxetine  60 mg Oral QHS  . enoxaparin (LOVENOX) injection  20 mg Subcutaneous Q24H  . fluticasone  1 spray Each Nare Daily  . insulin aspart  0-5 Units Subcutaneous QHS  . insulin aspart  0-9 Units Subcutaneous TID WC  . lamoTRIgine  100 mg Oral Daily  . lamoTRIgine  200 mg Oral QHS  . levofloxacin  750 mg Oral Daily  . loratadine  10 mg Oral Daily  . OLANZapine  5 mg Oral QHS  . predniSONE  60 mg Oral Q supper  . predniSONE  60 mg Oral Q breakfast  . DISCONTD: albuterol  2.5 mg Nebulization Q4H  . DISCONTD: azithromycin  250 mg Intravenous Q24H  . DISCONTD: azithromycin  250 mg Oral Q24H  . DISCONTD: cefTRIAXone (ROCEPHIN)  IV  1 g Intravenous Q24H  . DISCONTD: enoxaparin (LOVENOX) injection  20 mg Subcutaneous Q24H  . DISCONTD: enoxaparin (LOVENOX) injection  20 mg Subcutaneous Q24H  . DISCONTD: enoxaparin (LOVENOX) injection  40 mg Subcutaneous Q24H  . DISCONTD: ipratropium  0.5 mg Nebulization Q4H  . DISCONTD: methylPREDNISolone (SOLU-MEDROL) injection  60 mg Intravenous BID     Physical Exam: Blood pressure 131/76, pulse 95, temperature 97.9 F (36.6 C), temperature source Oral, resp. rate 16, height 5\' 1"  (1.549 m), weight 36.5 kg (80 lb 7.5 oz), SpO2 90.00%. General appearance: alert, cooperative and no distress Resp: wheezes bibasilar and bilaterally Cardio: regular rate and rhythm GI: normal findings: bowel sounds normal and soft,  non-tender  Lab results: Results for orders placed during the hospital encounter of 01/10/12 (from the past 24 hour(s))  GLUCOSE, CAPILLARY     Status: Abnormal   Collection Time   01/11/12 12:38 PM      Component Value Range   Glucose-Capillary 107 (*) 70 - 99 mg/dL   Comment 1 Notify RN    GLUCOSE, CAPILLARY     Status: Abnormal   Collection Time   01/11/12  4:17 PM      Component Value Range   Glucose-Capillary 330 (*) 70 - 99 mg/dL  GLUCOSE, CAPILLARY     Status: Abnormal   Collection Time   01/11/12  9:42 PM      Component Value Range   Glucose-Capillary 166 (*) 70 - 99 mg/dL   Comment 1 Documented in Chart     Comment 2 Notify RN    BASIC METABOLIC PANEL     Status: Abnormal   Collection Time   01/12/12  5:24 AM      Component Value Range   Sodium 138  135 - 145 mEq/L   Potassium 5.0  3.5 - 5.1 mEq/L   Chloride 99  96 - 112 mEq/L   CO2 29  19 - 32 mEq/L   Glucose, Bld 146 (*) 70 - 99 mg/dL   BUN 11  6 - 23 mg/dL   Creatinine, Ser  0.39 (*) 0.50 - 1.10 mg/dL   Calcium 9.1  8.4 - 45.4 mg/dL   GFR calc non Af Amer >90  >90 mL/min   GFR calc Af Amer >90  >90 mL/min  HEPATIC FUNCTION PANEL     Status: Abnormal   Collection Time   01/12/12  5:24 AM      Component Value Range   Total Protein 6.5  6.0 - 8.3 g/dL   Albumin 3.1 (*) 3.5 - 5.2 g/dL   AST 37  0 - 37 U/L   ALT 86 (*) 0 - 35 U/L   Alkaline Phosphatase 186 (*) 39 - 117 U/L   Total Bilirubin 0.2 (*) 0.3 - 1.2 mg/dL   Bilirubin, Direct <0.9  0.0 - 0.3 mg/dL   Indirect Bilirubin NOT CALCULATED  0.3 - 0.9 mg/dL  GLUCOSE, CAPILLARY     Status: Abnormal   Collection Time   01/12/12  8:32 AM      Component Value Range   Glucose-Capillary 115 (*) 70 - 99 mg/dL    Imaging results:  Dg Chest 2 View  01/11/2012  *RADIOLOGY REPORT*  Clinical Data: COPD exacerbation, possible left lower lobe pneumonia  CHEST - 2 VIEW  Comparison: 01/10/2012  Findings: Emphysematous changes/hyperinflation.  Patchy retrocardiac  opacity on the lateral view, although less conspicuous on the current frontal view, suspicious for left lower lobe pneumonia.  No pleural effusion or pneumothorax.  Possible nodular opacity in the medial right upper lobe, overlying the posterior sixth rib.  Cardiomediastinal silhouette is within normal limits.  Mild degenerative changes of the visualized thoracolumbar spine.  IMPRESSION: Patchy left lower lobe opacity, suspicious for pneumonia.  Possible nodular opacity overlying the medial right upper lobe. While this may reflect superimposition of osseous structures, consider CT with contrast for further evaluation.   Original Report Authenticated By: Charline Bills, M.D.    US Abdomen Complete  01/12/2012  *RADIOLOGY REPORT*  Clinical Data:  Elevated LFTs  COMPLETE ABDOMINAL ULTRASOUND  Comparison:  CT scan 09/04/2008  Findings:  Gallbladder:  No gallstones, gallbladder wall thickening, or pericholecystic fluid. No sonographic Murphy's sign.  Common bile duct:  Measures 5 mm in diameter within normal limits.  Liver:  No intrahepatic biliary ductal dilatation. Within normal limits in parenchymal echogenicity. Again noted a small cyst in the right hepatic lobe subcapsular measures 9 mm on the prior exam measures 9 x 7 mm.  IVC:  Appears normal.  Pancreas:  No focal abnormality seen.  Spleen:  Measures 4.4 cm in length.  Normal echogenicity.  Right Kidney:  Measures 11 cm in length.  No mass, hydronephrosis or diagnostic renal calculus  Left Kidney:  Measures 11.7 cm in length.  No mass, hydronephrosis or diagnostic renal calculus  Abdominal aorta:  No aneurysm identified. Measures up to 1.6 cm in diameter.  IMPRESSION:  1.  No gallstones are noted within gallbladder.  Normal CBD. 2.  Stable small right hepatic lobe cyst.  3.  No hydronephrosis or diagnostic renal calculus.   Original Report Authenticated By: Natasha Mead, M.D.     Assessment and Plan: I agree with the formulated Assessment and Plan with the  following changes:   COPD  LLL PNA (day 3 ceftriaxone/azithro)  hypoNatremia (resolved) Hepatitis  Elevated CK/MB  CHF? BNP 1969   Plan is for her to be titrated off O2 (see recnt RN note). Hopefully home without O2.  She will be d/c on anbx as well.  Could consider TTE at outpt f/u if  clinically needed

## 2012-01-12 NOTE — Progress Notes (Signed)
Order for d/c to home.  Home oxygen arranged per case management.  D/c instructions reviewed with pt using teach back and pt verbalized understanding.  Written instructions given to pt.  IV d/ced without problems and reviewed s/s of infection at site.  Pt given Rx and told when all meds need to be taken.   Pt states she understands instructions.  She states she has no ride home.  SW notified and bus pass given to pt.  Pt states she has no pants.  Disposable scrubs ordered.  Pt to d/c via WC with nurse tech.  Oxygen at bedside and to go with pt.  All belongings including glasses to go with pt.

## 2012-01-12 NOTE — Discharge Summary (Signed)
Patient Name:  [name] MRN: [id number]  PCP: Mathis Dad, MD DOB:  08/19/1950       Date of Admission:  01/10/2012  Date of Discharge:  01/12/2012      Attending Physician: Ginnie Smart, MD         DISCHARGE DIAGNOSES: 1.   Acute COPD exacerbation:  Home with O2, prednisone, and levofloxacin 2.   Acute on chronic respiratory failure:  Improved, on home O2 now 3.   Hyponatremia:  resolved 4.   Decubitus, stage 1:  stable 5.   Elevated liver function tests:  improving 6.   Claustrophobia:  With bipap 7.   Depression:  stable 8.   Bipolar disorder:  Stable 9.   Community acquired pneumonia:  levofloxacin   DISPOSITION AND FOLLOW-UP: [name] is to follow-up with the listed providers as detailed below, at which time, the following should be addressed:  1. Follow-up visits: 1. ELEVATED LFTs:  Bilirubin remained normal but AST, ALT, and alkaline phosphatase were elevated at admission, but then trended down.  Abdominal ultrasound was unremarkable.  Follow up with repeat LFT panel. 2. COPD:  Home with prednisone taper and antibiotics.  Home oxygen was also set up.  Assess how she is doing regarding oxygen use at home. 3. HYPONATREMIA:  Mixed picture regarding cause and actual volume status.  We considered "tea & toast" nutrition as a cause, psychogenic polydipsia, SIADH, and CHF.  Clinical picture and laboratory data did not fit any of these.  Follow up with BMET. 4. DECUBITUS:  Stage 1 on sacrum.  Monitor for progression/resolution.  2. Labs and images needed: 1. BMET - monitor sodium 2. LFTs  3. Pending labs and tests needing follow-up:  NONE   Follow-up Information    Follow up with HO,MICHELE, MD. On 01/17/2012. (Tuesday November 5th at 2:15PM)    Contact information:   [address] [place] [address] [phone]         Discharge Orders    Future Appointments: Provider: Department: Dept Phone: Center:   01/17/2012 2:15 PM  Mathis Dad, MD [facility] [phone] [facility]     Future Orders Please Complete By Expires   Diet - low sodium heart healthy      Increase activity slowly      Call MD for:  temperature >100.4      Call MD for:  difficulty breathing, headache or visual disturbances      Call MD for:  persistant dizziness or light-headedness          DISCHARGE MEDICATIONS:   Medication List     As of 01/12/2012  3:24 PM    TAKE these medications         albuterol 108 (90 BASE) MCG/ACT inhaler   Commonly known as: PROVENTIL HFA;VENTOLIN HFA   Inhale 1 puff into the lungs every 4 (four) hours as needed. For wheezing      albuterol (2.5 MG/3ML) 0.083% nebulizer solution   Commonly known as: PROVENTIL   Take 2.5 mg by nebulization every 6 (six) hours as needed. Shortness of breath      albuterol 108 (90 BASE) MCG/ACT inhaler   Commonly known as: PROVENTIL HFA;VENTOLIN HFA   Inhale 2 puffs into the lungs every 4 (four) hours as needed. For shortness of breath      alendronate 35 MG tablet   Commonly known as: FOSAMAX   Take 35 mg by mouth  every 7 (seven) days. Take in the morning with a full glass of water, on an empty stomach, and do not take anything else by mouth or lie down for the next 30 min.      calcium carbonate 600 MG Tabs   Commonly known as: OS-CAL   Take 600 mg by mouth 2 (two) times daily.      cholecalciferol 1000 UNITS tablet   Commonly known as: VITAMIN D   Take 1,000 Units by mouth daily.      clemastine 2.68 MG Tabs   Commonly known as: TAVIST   Take 2.68 mg by mouth 2 (two) times daily.      DULoxetine 30 MG capsule   Commonly known as: CYMBALTA   Take 30-60 mg by mouth daily. Takes 30 mg in the morning and 60 mg in the evenings      fluticasone 50 MCG/ACT nasal spray   Commonly known as: FLONASE   Place 1 spray into the nose daily.      Fluticasone-Salmeterol 250-50 MCG/DOSE Aepb   Commonly known as: ADVAIR   Inhale 1 puff into the lungs 2 (two) times  daily.      lamoTRIgine 100 MG tablet   Commonly known as: LAMICTAL   Take 100-200 mg by mouth 2 (two) times daily. 1 tablet in the morning and 2 tablets at night      levofloxacin 750 MG tablet   Commonly known as: LEVAQUIN   Take 1 tablet (750 mg total) by mouth daily.      OLANZapine 5 MG tablet   Commonly known as: ZYPREXA   Take 5 mg by mouth at bedtime.      PERCOCET 10-325 MG per tablet   Generic drug: oxyCODONE-acetaminophen   Take 1 tablet by mouth every 6 (six) hours as needed. For pain      predniSONE 20 MG tablet   Commonly known as: DELTASONE   Take by mouth with breakfast. Take 3 pills (60mg ) for 2 days, then 2 pills (40mg ) for 3 days, then 1 pill (20mg ) for 3 days. Finish on 11/8.      pseudoephedrine 60 MG tablet   Commonly known as: SUDAFED   Take 60 mg by mouth every 4 (four) hours as needed. For congestion      tiotropium 18 MCG inhalation capsule   Commonly known as: SPIRIVA   Place 18 mcg into inhaler and inhale daily.         CONSULTS:  NONE   PROCEDURES PERFORMED:   Dg Chest Portable 1 View 01/10/2012  FINDINGS: Severe changes of COPD are noted with marked hyperaeration.  There is some haziness at the left lung base and a patchy area of pneumonia cannot be excluded.  A lateral chest x-ray would be helpful.  No definite effusion is seen.  Mediastinal contours are stable.  The heart is within normal limits in size.   IMPRESSION:  1.  Severe COPD. 2.  Haziness at the left lung base.  Cannot exclude pneumonia. Recommend two-view chest x-ray of possible    Dg Chest 2 View 01/11/2012  FINDINGS: Emphysematous changes/hyperinflation.  Patchy retrocardiac opacity on the lateral view, although less conspicuous on the current frontal view, suspicious for left lower lobe pneumonia.  No pleural effusion or pneumothorax.  Possible nodular opacity in the medial right upper lobe, overlying the posterior sixth rib.  Cardiomediastinal silhouette is within normal  limits.  Mild degenerative changes of the visualized thoracolumbar spine.   IMPRESSION: Patchy  left lower lobe opacity, suspicious for pneumonia.  Possible nodular opacity overlying the medial right upper lobe. While this may reflect superimposition of osseous structures, consider CT with contrast for further evaluation.  US Abdomen Complete 01/12/2012  FINDINGS:  Gallbladder:  No gallstones, gallbladder wall thickening, or pericholecystic fluid. No sonographic Murphy's sign.  Common bile duct:  Measures 5 mm in diameter within normal limits.  Liver:  No intrahepatic biliary ductal dilatation. Within normal limits in parenchymal echogenicity. Again noted a small cyst in the right hepatic lobe subcapsular measures 9 mm on the prior exam measures 9 x 7 mm.  IVC:  Appears normal.  Pancreas:  No focal abnormality seen.  Spleen:  Measures 4.4 cm in length.  Normal echogenicity.  Right Kidney:  Measures 11 cm in length.  No mass, hydronephrosis or diagnostic renal calculus  Left Kidney:  Measures 11.7 cm in length.  No mass, hydronephrosis or diagnostic renal calculus  Abdominal aorta:  No aneurysm identified. Measures up to 1.6 cm in diameter.   IMPRESSION:  1.  No gallstones are noted within gallbladder.  Normal CBD. 2.  Stable small right hepatic lobe cyst.  3.  No hydronephrosis or diagnostic renal calculus.     ADMISSION DATA: H&P:  Ms. Madole is a 61 yo woman with a history of COPD, HTN, and manic depressive disorder that presents with a 1 day history of increasing shortness of breath. Per the patient she was fine 10/27 but woke up 10/28 with shortness of breath which worsened throughout the day progressing to orthopnea, she mentioned having to sleep in her chair last night due to dyspnea with lying flat. Pt endorses productive cough (greenish sputum) starting 10/28.   Physical Exam: Vitals - Temp 36.9, BP 160/80, Pulse 85, Resp rate 22, SpO2 100 on BiPAP  General - Cachectic appearing woman in bed in  acute distress, BiPAP mask in place  HEENT - NCAT  CV - RRR, no murmurs, rubs, or clicks appreciated on auscultation, 2+ radial and dorsalis pedis pulses bilaterally  Lungs - Increased work of breathing (accessory muscle use on both inhalation and exhalation), expiratory wheezes in all lung fields, no crackles heard, lungs tympanic to percussion in all fields  Abdominal - Difficult to assess as pt was tightening abdominal muscles significantly on expiration, +BS, no pain or tenderness to palpation, no masses felt  Extremities - No signs of peripheral edema  Neuro - Alert and oriented x 3, pt moved all extremities spontaneously, normal grip strength, 5/5 plantar flexion and dorsiflexion  Psych - Appropriate for interaction   Labs: Basic Metabolic Panel:  Lab  01/12/12 0524  01/11/12 0355  01/10/12 2128  01/10/12 1224  01/10/12 1017   NA  138  130*  124*  118*  117*   K  5.0  4.3  4.5  4.0  4.4   CL  99  89*  84*  80*  81*   CO2  29  26  25  25  24    GLUCOSE  146*  102*  94  164*  133*   BUN  11  7  6  6  7    CREATININE  0.39*  0.37*  0.33*  0.31*  0.28*   CALCIUM  9.1  8.6  8.9  --  --   MG  --  --  --  --  --   PHOS  --  --  --  --  --    Liver Function Tests:  Lab  01/12/12 0524  01/10/12 1224  01/10/12 1017   AST  37  133*  142*   ALT  86*  168*  166*   ALKPHOS  186*  262*  258*   BILITOT  0.2*  0.2*  0.3   PROT  6.5  7.0  6.8   ALBUMIN  3.1*  3.4*  3.4*    CBC:  Lab  01/10/12 1017   WBC  13.0*   NEUTROABS  11.1*   HGB  12.0   HCT  34.5*   MCV  91.0   PLT  346    Cardiac Enzymes:  Lab  01/11/12 0355  01/10/12 2128  01/10/12 1510  01/10/12 1201   CKTOTAL  431*  519*  408*  --   CKMB  14.1*  18.4*  16.2*  --   CKMBINDEX  --  --  --  --   TROPONINI  <0.30  <0.30  <0.30  <0.30    Venous blood gas Lab  01/10/12 1053  pH 7.259  pCO2 57.5  pO2 122.0  Bicarb 25.8  CO2 28  O2 Sat 98    HOSPITAL COURSE: 1.   Acute COPD Exacerbation:  Pt presented with increased  dyspnea, increased sputum production, and a change in sputum culture. Physical exam was significant for expiratory wheezes throughout all lung fields. Chest x ray demonstrated COPD and questionable pneumonia. During this stay she was managed with scheduled nubulizers of albuterol and ipratropium for bronchodilation. Empiric antibiotic therapy was started with ceftriaxone and azithromycin and continued until last day of admission when she was switched to levofloxacin. Pt is being discharged with a 7 day course of levofloxacin (6 more days). Inflammation was treated with methylprednisolone until last day of admission when she was switched to oral prednisone. She is being discharged on a 9 day total taper of prednisone (8 more days). On last day of admission pt was taken off of O2 and stayed stable 90-92% but desaturated to 88% with eating and talking; on ambulation on room air she desaturated to 79%. She is being discharged with home oxygen therapy.  2.   Hyponatremia : Discovered on admission labs pt had a Na of 118. She was initially fluid restricted yet she urinated almost 5 L of urine during this admission and her sodium corrected to 130 on 10/31 and normalized to 138 on 10/31. No pharmaceutical intervention was used. Very mixed picture. Clinical picture does not support CHF. Laboratory studies do not completely fit with SIADH or psychogenic polydipsia. It is, however, improving. TSH and morning cortisol were normal.  3.   Elevated Pro-BNP:  Discovered with initial lab work up for CHF concern. Pt's physical exams did not suggest fluid overload, she showed no other signs of heart failure, and her sodium corrected so no further work up or treatment was pursued.  4.   Stage 1 decubitus: Found by nursing staff at admission on patient's sacrum. It was followed with serial physical exams for further breakdown with no evidence of progression. Pt was encouraged to turn herself in bed regularly and maintain good  nutrition when her NPO status was lifted.   5.   Elevated liver function tests:  At admission:  AST 142, ALT 166, alkaline phosphatase 258, and total bilirubin 0.3.  These decreased to:  AST 37, ALT 86, and alkaline phosphatase 186 at discharge.  Abdominal ultrasound is remarkable only for a stable hepatic cyst.  6.   Claustrophobia:   Pt became  claustrophobic with BiPAP mask on and was refusing to wear it. Lorazepam was ordered and used once.  7.   Depression and bipolar disorder:  Stable.  Kept on home medications of duloxetine, lamotrigine, and olanzapine.  8.   Community acquired pneumonia:  Left lower lobe visualized on chest xray.  Likely trigger for #1.  Treated with levofloxacin as per #1.   DISCHARGE DATA: Vital Signs: BP 136/66  Pulse 88  Temp 98 F (36.7 C) (Oral)  Resp 18  Ht 5\' 1"  (1.549 m)  Wt 80 lb 7.5 oz (36.5 kg)  BMI 15.20 kg/m2  SpO2 93%  Labs: Results for orders placed during the hospital encounter of 01/10/12 (from the past 24 hour(s))  GLUCOSE, CAPILLARY     Status: Abnormal   Collection Time   01/11/12  4:17 PM      Component Value Range   Glucose-Capillary 330 (*) 70 - 99 mg/dL  GLUCOSE, CAPILLARY     Status: Abnormal   Collection Time   01/11/12  9:42 PM      Component Value Range   Glucose-Capillary 166 (*) 70 - 99 mg/dL   Comment 1 Documented in Chart     Comment 2 Notify RN    BASIC METABOLIC PANEL     Status: Abnormal   Collection Time   01/12/12  5:24 AM      Component Value Range   Sodium 138  135 - 145 mEq/L   Potassium 5.0  3.5 - 5.1 mEq/L   Chloride 99  96 - 112 mEq/L   CO2 29  19 - 32 mEq/L   Glucose, Bld 146 (*) 70 - 99 mg/dL   BUN 11  6 - 23 mg/dL   Creatinine, Ser 1.61 (*) 0.50 - 1.10 mg/dL   Calcium 9.1  8.4 - 09.6 mg/dL   GFR calc non Af Amer >90  >90 mL/min   GFR calc Af Amer >90  >90 mL/min  HEPATIC FUNCTION PANEL     Status: Abnormal   Collection Time   01/12/12  5:24 AM      Component Value Range   Total Protein 6.5   6.0 - 8.3 g/dL   Albumin 3.1 (*) 3.5 - 5.2 g/dL   AST 37  0 - 37 U/L   ALT 86 (*) 0 - 35 U/L   Alkaline Phosphatase 186 (*) 39 - 117 U/L   Total Bilirubin 0.2 (*) 0.3 - 1.2 mg/dL   Bilirubin, Direct <0.4  0.0 - 0.3 mg/dL   Indirect Bilirubin NOT CALCULATED  0.3 - 0.9 mg/dL  GLUCOSE, CAPILLARY     Status: Abnormal   Collection Time   01/12/12  8:32 AM      Component Value Range   Glucose-Capillary 115 (*) 70 - 99 mg/dL  GLUCOSE, CAPILLARY     Status: Abnormal   Collection Time   01/12/12 12:41 PM      Component Value Range   Glucose-Capillary 154 (*) 70 - 99 mg/dL     Time spent on discharge: 45 minutes   Services Ordered on Discharge: PT - No OT - No RN- No Other - No  Signed by:  Dorthula Rue. Earlene Plater, MD PGY-I, Internal Medicine  01/12/2012, 3:24 PM

## 2012-01-12 NOTE — Progress Notes (Addendum)
Patient ID: [name], female   DOB: 01/19/1951, 61 y.o.   MRN: [id number] Medical Student Daily Progress Note   Subjective:    Interval Events:   No acute events overnight.  Pt feels that her breathing is back to baseline.  Still has a productive cough but this again is baseline for her.  She is able to ambulate to the bedside commode, she is apprehensive to try any more activity saying she is attached to too many things. No new complaints this morning.  Denies HA, chest pain, SOB, abdominal pain, and weakness/tingling in extremities.    Objective:    Vital Signs:   Temp:  [97 F (36.1 C)-98.8 F (37.1 C)] 97.9 F (36.6 C) (10/31 0747) Pulse Rate:  [82-103] 95  (10/31 1000) Resp:  [14-22] 16  (10/31 1000) BP: (117-134)/(50-76) 131/76 mmHg (10/31 0747) SpO2:  [89 %-100 %] 90 % (10/31 1000) Weight:  [36.5 kg (80 lb 7.5 oz)] 36.5 kg (80 lb 7.5 oz) (10/31 0430) Last BM Date: 01/10/12   Weights: 24-hour Weight change: -3.7 kg (-8 lb 2.5 oz)  Filed Weights   01/10/12 1512 01/11/12 0500 01/12/12 0430  Weight: 40.2 kg (88 lb 10 oz) 39 kg (85 lb 15.7 oz) 36.5 kg (80 lb 7.5 oz)   Net since admission:  -3.7 kg   Intake/Output:   Intake/Output Summary (Last 24 hours) at 01/12/12 1055 Last data filed at 01/12/12 0900  Gross per 24 hour  Intake   1255 ml  Output   3075 ml  Net  -1820 ml     Net since admission:  -4822 mL LBM - 10/29   Physical Exam: GENERAL: alert and oriented; resting comfortably sitting up in bed and in no distress  EYES: pupils equal, round, and reactive to light; sclera anicteric  ENT: moist mucosa, throat is clean and non-erythematous  NECK: no JVD, no lymphadenopathy LUNGS: Faint end expiratory wheezes in all lung fields, minimal if any accessory muscle use appreciated HEART: normal rate; regular rhythm; normal S1 and S2, no S3 or S4 appreciated; no murmurs, rubs, or clicks  ABDOMEN: soft, tender to deep palpation in RUQ, normal bowel sounds, no  masses palpated  EXTREMITIES: No peripheral edema, no calf tenderness, 2+ radial and dorsalis pedis pulses  NEURO: EOMI, tongue protrudes midline  SKIN: normal turgor, stage 1 decubitus noted at sacrum (non-blanchable 1x1 cm spot at top right of gluteal cleft)    Labs: Basic Metabolic Panel:  Lab 01/12/12 2956 01/11/12 0355 01/10/12 2128 01/10/12 1224 01/10/12 1017  NA 138 130* 124* 118* 117*  K 5.0 4.3 4.5 4.0 4.4  CL 99 89* 84* 80* 81*  CO2 29 26 25 25 24   GLUCOSE 146* 102* 94 164* 133*  BUN 11 7 6 6 7   CREATININE 0.39* 0.37* 0.33* 0.31* 0.28*  CALCIUM 9.1 8.6 8.9 -- --  MG -- -- -- -- --  PHOS -- -- -- -- --    Liver Function Tests:  Lab 01/12/12 0524 01/10/12 1224 01/10/12 1017  AST 37 133* 142*  ALT 86* 168* 166*  ALKPHOS 186* 262* 258*  BILITOT 0.2* 0.2* 0.3  PROT 6.5 7.0 6.8  ALBUMIN 3.1* 3.4* 3.4*   No results found for this basename: LIPASE:5,AMYLASE:5 in the last 168 hours No results found for this basename: AMMONIA:3 in the last 168 hours  CBC:  Lab 01/10/12 1017  WBC 13.0*  NEUTROABS 11.1*  HGB 12.0  HCT 34.5*  MCV 91.0  PLT 346  Cardiac Enzymes:  Lab 01/11/12 0355 01/10/12 2128 01/10/12 1510 01/10/12 1201  CKTOTAL 431* 519* 408* --  CKMB 14.1* 18.4* 16.2* --  CKMBINDEX -- -- -- --  TROPONINI <0.30 <0.30 <0.30 <0.30    BNP (last 3 results):  Basename 01/10/12 1025  PROBNP 1969.0*    CBG:  Lab 01/12/12 0832 01/11/12 2142 01/11/12 1617 01/11/12 1238 01/11/12 0757  GLUCAP 115* 166* 330* 107* 113*    Coagulation Studies: No results found for this basename: LABPROT:5,INR:5 in the last 72 hours  Microbiology: Results for orders placed during the hospital encounter of 01/10/12  CULTURE, BLOOD (ROUTINE X 2)     Status: Normal (Preliminary result)   Collection Time   01/10/12 10:30 AM      Component Value Range Status Comment   Specimen Description BLOOD LEFT ANTECUBITAL   Final    Special Requests BOTTLES DRAWN AEROBIC AND ANAEROBIC  10CC   Final    Culture  Setup Time 01/10/2012 19:43   Final    Culture     Final    Value:        BLOOD CULTURE RECEIVED NO GROWTH TO DATE CULTURE WILL BE HELD FOR 5 DAYS BEFORE ISSUING A FINAL NEGATIVE REPORT   Report Status PENDING   Incomplete   CULTURE, BLOOD (ROUTINE X 2)     Status: Normal (Preliminary result)   Collection Time   01/10/12 10:35 AM      Component Value Range Status Comment   Specimen Description BLOOD LEFT HAND   Final    Special Requests BOTTLES DRAWN AEROBIC AND ANAEROBIC 10CC   Final    Culture  Setup Time 01/10/2012 19:42   Final    Culture     Final    Value:        BLOOD CULTURE RECEIVED NO GROWTH TO DATE CULTURE WILL BE HELD FOR 5 DAYS BEFORE ISSUING A FINAL NEGATIVE REPORT   Report Status PENDING   Incomplete   MRSA PCR SCREENING     Status: Normal   Collection Time   01/10/12  3:22 PM      Component Value Range Status Comment   MRSA by PCR NEGATIVE  NEGATIVE Final     Imaging: Dg Chest 2 View  01/11/2012  *RADIOLOGY REPORT*  Clinical Data: COPD exacerbation, possible left lower lobe pneumonia  CHEST - 2 VIEW  Comparison: 01/10/2012  Findings: Emphysematous changes/hyperinflation.  Patchy retrocardiac opacity on the lateral view, although less conspicuous on the current frontal view, suspicious for left lower lobe pneumonia.  No pleural effusion or pneumothorax.  Possible nodular opacity in the medial right upper lobe, overlying the posterior sixth rib.  Cardiomediastinal silhouette is within normal limits.  Mild degenerative changes of the visualized thoracolumbar spine.  IMPRESSION: Patchy left lower lobe opacity, suspicious for pneumonia.  Possible nodular opacity overlying the medial right upper lobe. While this may reflect superimposition of osseous structures, consider CT with contrast for further evaluation.   Original Report Authenticated By: Charline Bills, M.D.    US Abdomen Complete  01/12/2012  *RADIOLOGY REPORT*  Clinical Data:  Elevated LFTs   COMPLETE ABDOMINAL ULTRASOUND  Comparison:  CT scan 09/04/2008  Findings:  Gallbladder:  No gallstones, gallbladder wall thickening, or pericholecystic fluid. No sonographic Murphy's sign.  Common bile duct:  Measures 5 mm in diameter within normal limits.  Liver:  No intrahepatic biliary ductal dilatation. Within normal limits in parenchymal echogenicity. Again noted a small cyst in the right hepatic lobe subcapsular  measures 9 mm on the prior exam measures 9 x 7 mm.  IVC:  Appears normal.  Pancreas:  No focal abnormality seen.  Spleen:  Measures 4.4 cm in length.  Normal echogenicity.  Right Kidney:  Measures 11 cm in length.  No mass, hydronephrosis or diagnostic renal calculus  Left Kidney:  Measures 11.7 cm in length.  No mass, hydronephrosis or diagnostic renal calculus  Abdominal aorta:  No aneurysm identified. Measures up to 1.6 cm in diameter.  IMPRESSION:  1.  No gallstones are noted within gallbladder.  Normal CBD. 2.  Stable small right hepatic lobe cyst.  3.  No hydronephrosis or diagnostic renal calculus.   Original Report Authenticated By: Natasha Mead, M.D.       Medications:    Scheduled Medications:    . antiseptic oral rinse  15 mL Mouth Rinse q12n4p  . calcium carbonate  1,250 mg Oral BID  . chlorhexidine  15 mL Mouth Rinse BID  . cholecalciferol  1,000 Units Oral Daily  . DULoxetine  30 mg Oral Daily  . DULoxetine  60 mg Oral QHS  . enoxaparin (LOVENOX) injection  20 mg Subcutaneous Q24H  . fluticasone  1 spray Each Nare Daily  . insulin aspart  0-5 Units Subcutaneous QHS  . insulin aspart  0-9 Units Subcutaneous TID WC  . lamoTRIgine  100 mg Oral Daily  . lamoTRIgine  200 mg Oral QHS  . levofloxacin  750 mg Oral Daily  . loratadine  10 mg Oral Daily  . OLANZapine  5 mg Oral QHS  . predniSONE  60 mg Oral Q supper  . predniSONE  60 mg Oral Q breakfast  . DISCONTD: albuterol  2.5 mg Nebulization Q4H  . DISCONTD: azithromycin  250 mg Intravenous Q24H  . DISCONTD:  azithromycin  250 mg Oral Q24H  . DISCONTD: cefTRIAXone (ROCEPHIN)  IV  1 g Intravenous Q24H  . DISCONTD: enoxaparin (LOVENOX) injection  40 mg Subcutaneous Q24H  . DISCONTD: ipratropium  0.5 mg Nebulization Q4H  . DISCONTD: methylPREDNISolone (SOLU-MEDROL) injection  60 mg Intravenous BID     PRN Medications: albuterol, albuterol, benzonatate, ibuprofen, ipratropium, LORazepam, oxyCODONE, oxyCODONE-acetaminophen    Assessment/ Plan:    Ms. Lee is a 61 yo woman with a history of COPD, HTN, and multiple psychiatric issues on HD #2 being treated for an acute COPD exacerbation and hyponatremia.   Acute COPD Exacerbation: Pt has a past history of COPD, shortness of breath came on suddenly with no associated fever or chills making pneumonia less likely. 2 view CXR showed COPD with suspicion for pneumonia. She met 3 out of 3 cardinal criteria for a moderate to severe COPD exacerbation at admission (increased dyspnea, increased sputum volume, sputum is purulent).  -Currently scheduled albuterol  -Currently scheduled ipratropium -Oral Prednisone with taper at discharge - started on 60 mg yesterday, then 60 mg x4 days, 40 mg x4 days, finish with 20 mg x 4 days  -Ceftriaxone and azithromycin Day #3, pt tolerated oral abx well, will dc these today, start 7 day course of oral levofloxacin today  -O2 therapy - 90-92% saturation on RA, dipped to 87% while eating and talking; Plan to keep her off O2 and ambulate her this afternoon w/ pulse oximetry if O2 sats drop to 87% or less plan to discharge her on home oxygen. 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  -No further intervention needed  Elevated Pro-BNP: Pt does not appear clinically hypervolemia (no crackles on pulmonary exam, no peripheral edema, no significant findings on  cardiovascular exam) but Pro-BNP is increased to 1969 suggests volume overload. CK-MB is still elevated but trending down troponin is still normal making an acute coronary syndrome less likely, CK could have been increased due to increased respiratory muscle use. Given increased likelyhood of primary polydipsia increased intravascular volume would explain increased Pro-BNP.  -No treatment needed   Hypotension/Tachycardia: Likely secondary to increased diuresis after polydipsia, tachycardia could also be a side effect of her bronchodilator and corticosteroid therapies  -Pt's BP has been fluctuating but roughly normal -No treatment needed  Stage 1 Decubitus: On physical exam non-blanchable 1x1 cm area noted at sacrum  -Offload the patient with frequent turns, air mattress, or other device  -Cover ulcer with bandage  -Maintain nutrition  -Monitor for skin breakdown   HTN: Pt has a historical diagnosis of HTN but is currently on no anti-hypertensives at home, pt presented with HTN but has since resolved. Pt was actually HYPOtensive overnight  -Not treating HTN because her BP's have been in the normal or low range since admission  -Monitor vital signs per floor routine   Elevated Liver Function Tests: AST has corrected ALT still elevated but significantly decreased, alkaline phoshatase also decreasing.  Two potential causes for this patient are inflammation from an infectious source and venous congestion from CHF or hepatic vein obstruction. A biliary tract obstruction or insult is potentially a cause given her increased alkaline phosphatase but is less likely given her decreased bilirubin level. Liver damage 2/2 alcohol is a potential cause but normally AST is greater than ALT.  -Ultrasound of abdomen was normal -Hepatitis panel negative  -HIV non reactive x2 -Follow up as an outpatient  Anxiety: Pt became claustrophobic with BiPAP mask on and was refusing to wear it  -Don't anticipate issues as  patient's saturations are stable on nasal canula  -Lorazepam prn  -O2 therapy via mask with BiPAP prn if respiratory effort decreases   Depression/Bipolar Disorder:  -Continue home medications of Duloxetine, Lamotrigine, and Olanzapine   VTE Prophylaxis:  -Scheduled enoxaparin   Diet:  -Regular diet   Dispo:  -Plan to discharge back to home at [facility] today    Length of Stay: 2 days   This is a Psychologist, occupational Note.  The care of the patient was discussed with Dr. Earlene Plater and the assessment and plan formulated with their assistance.  Please see their attached note or addendum for official documentation of the daily encounter.  Addendum to medical student daily progress note:  I have seen the patient and reviewed the daily progress note by Frederik Pear and discussed the care of the patient with him. See below for documentation of my findings, assessment, and plans.   Subjective:    Interval Events:  I agree with above documentation.  Much better, almost back to baseline.    Objective:    Vital Signs:  Above vital signs were reviewed.   Physical Exam: GENERAL:  alert and oriented; resting comfortably in bed and in no distress LUNGS:  mild, end-expiratory wheezes; normal work of breathing HEART:  normal rate; regular rhythm; normal S1 and S2, no S3 or S4 appreciated; no murmurs, rubs, or clicks     Labs:  Above labs and radiographic studies were reviewed.    Assessment/ Plan:    I agree  with the above assessment and plan.  1.   Acute COPD exacerbation and acute on chronic respiratory failure:  Transitioned to oral prednisone; she will complete a 9 day taper.  Antibiotics transitioned to oral levofloxacin and she will take this for 6 more days.  She desaturates to 79% with walking, so we are setting her up with home oxygen.  2.   Hyponatremia:  Very mixed picture.  Clinical picture does not support CHF.  Laboratory studies do not completely fit with SIADH or  psychogenic polydipsia.  It is, however, improving.  TSH and morning cortisol were normal.  Sodium correct to 138 today.  3.   Elevated LFTs:  AST, ALT, and alkaline phosphatase are down trending.  Abdominal ultrasound is remarkable only for a stable hepatic cyst.  4.   Community acquired pneumonia:  Opacity noted in left lower lobe suspicious for pneumonia.  Likely trigger for #1.  Will treat with levofloxacin.  5.   Disposition:  Home with home oxygen.  Follow up with IM clinic.    Length of Stay: 2 days   Signed by:  Dorthula Rue. Earlene Plater, MD PGY-I, Internal Medicine Pager [phone]  01/12/2012, 3:06 PM

## 2012-01-12 NOTE — Progress Notes (Signed)
Pt seen, discussed with housestaff. Please see my note as well.

## 2012-01-12 NOTE — Care Management Note (Signed)
    Page 1 of 1   01/12/2012     3:03:32 PM   CARE MANAGEMENT NOTE 01/12/2012  Patient:  [name], [name]   Account Number:  [id number]  Date Initiated:  01/11/2012  Documentation initiated by:  Alvira Philips Assessment:   61 yr-old female adm with COPD exac; lives alone @ [address], has neb machine from [place]     Action/Plan:   Anticipated DC Date:  01/12/2012   Anticipated DC Plan:  HOME/SELF CARE      DC Planning Services  CM consult      Choice offered to / List presented to:             Status of service:   Medicare Important Message given?   (If response is "NO", the following Medicare IM given date fields will be blank) Date Medicare IM given:   Date Additional Medicare IM given:    Discharge Disposition:    Per UR Regulation:  Reviewed for med. necessity/level of care/duration of stay  If discussed at Long Length of Stay Meetings, dates discussed:    Comments:  PCP:  Dr. Carrolyn Meiers [facility]  01-12-12 3pm Avie Arenas, RNBSN [phone] Plan for patient to dc home today with continuous oxygen at 2 liters.  Notified [facility] of need - have paperwork ready to fax but no order placed yet.  Waiting for order to fax to [facility].  Mandy from [facility] called back to say she has already delivered tank to room and will deliver oxygen concentrator to house once she is discharged.  Will have nurse call Angelica Chessman when patient leaving hospital to deliver concentrator.  01/11/12 1059 Henrietta Mayo RN MSN CCM Received TC from [place] with Patsy Lager [phone]) who states that overnight oximetry revealed the need for nocturnal O2. Pt was hospitalized before O2 was delivered.  Mandy requested TC when pt discharged so [facility] can supply nocturnal O2.  If continuous O2 is needed, O2 sat of 88% or lower will need to be documented per protocol.

## 2012-01-16 LAB — CULTURE, BLOOD (ROUTINE X 2): Culture: NO GROWTH

## 2012-01-17 ENCOUNTER — Ambulatory Visit (INDEPENDENT_AMBULATORY_CARE_PROVIDER_SITE_OTHER): Payer: Medicare Other | Admitting: Internal Medicine

## 2012-01-17 ENCOUNTER — Encounter: Payer: Medicare Other | Admitting: Internal Medicine

## 2012-01-17 ENCOUNTER — Encounter: Payer: Self-pay | Admitting: Internal Medicine

## 2012-01-17 VITALS — BP 129/65 | HR 88 | Temp 100.0°F | Ht 61.0 in | Wt 87.0 lb

## 2012-01-17 DIAGNOSIS — E871 Hypo-osmolality and hyponatremia: Secondary | ICD-10-CM

## 2012-01-17 DIAGNOSIS — J449 Chronic obstructive pulmonary disease, unspecified: Secondary | ICD-10-CM

## 2012-01-17 DIAGNOSIS — R7989 Other specified abnormal findings of blood chemistry: Secondary | ICD-10-CM

## 2012-01-17 NOTE — Assessment & Plan Note (Signed)
She was recently found be hyponatremic with Na level of 117. It was a very mixed pictured but largely felt to be a result of 'tea and toast'. Na level was better when she was discharged with 138 on 10/31. Plan  - check with CMET to include LFTs as well which were elevated at the same time  -she will be contacted if the results are abnormal

## 2012-01-17 NOTE — Assessment & Plan Note (Signed)
She was found to have elevated liver enzymes during her recent hospitalization. Hepatitis panel was negative. The cause of this elevation in liver enzymes was not determined. Plan -Comprehensive metabolic panel.

## 2012-01-17 NOTE — Patient Instructions (Addendum)
Please continue with your current medication. No changes has been made. Please continue with your oxygen all the time  Please stop smoking  You will be contacted if the blood check results are abnormal otherwise, Dr Anselm Jungling will discuss with you the results

## 2012-01-17 NOTE — Assessment & Plan Note (Addendum)
Ms. Urbanski was recently admitted for COPD exacerbation and was started on home oxygen at 2 L per minute. She has responded well to this treatment in combination with a short course of oral prednisone and levofloxacin for treatment of the COPD exacerbation. Her oxygen saturation today in the clinic was 92% on 2 L by nasal cannula. The patient reports improved breathing and increased exercise tolerance. She does not have any complaints. She does not report any wheezing. She reports that she has cut down on cigarette smoking from 2 packs per day to just 2 cigarettes per day. She has been congratulated on this achievement. She reports extra caution to smoke outside the house, and she has been counseled about the risk of fire if she smokes with her oxygen on connected.   Plan  - continue with home oxygen  - continue with inhalers as prescribed including albuterol, Advair and Spiriva -she will complete her remaining days of oral levofloxacin and predinosone -She will follow up with Dr Anselm Jungling in 3 months  - she has been encouraged to call the clinic at time she has a problem

## 2012-01-17 NOTE — Progress Notes (Signed)
Patient ID: [name], female   DOB: [birth year], 61 y.o.   MRN: [id number]  Subjective:   Patient ID: [name] female   DOB: 1950/08/27 61 y.o.   MRN: [id number]  HPI: [name] is a 61 y.o. with past medical history significant for COPD, hypertension, depression, chronic low back pain, and history of substance abuse, presents today for hospital followup.   The patient was admitted recently and discharged on 01/12/2012 with a diagnosis of COPD exacerbation. She was discharged on levofloxacin in an oral prednisone to treat this exacerbation. She was also started on home oxygen at discharge. She is currently on 2 L per min. The patient reports that she is doing well with better breathing, no history of cough, no wheezing, no chest pain, no fevers or chills. She does not report any sputum production. She reports to be adherent with her medications. She does not have any complaints today and she is very happy with the improvement after treatment. In the hospital. Her inhalers include albuterol as needed, Spiriva and Advair. She is taking the rest of her medications with good compliance.  She also presents for followup blood check for hyponatremia and elevated liver enzymes. The cause of these was undetermined in the hospital.     Past Medical History  Diagnosis Date  . Depression   . Hypertension   . Suicidal ideation 2007     attempted overdose 2012  Dr. Gwendalyn Ege report  . Bipolar disorder   . Substance abuse      narcotics, alcohol, tobacco  . COPD (chronic obstructive pulmonary disease)     not on home oxygen  . Chronic pain syndrome     follows at pain managment  . DEFICIENCY, VITAMIN D NOS 01/03/2007  . Shortness of breath     "all the time" (01/10/2012)  . Arthritis     "legs, back" (01/10/2012)  . Chronic lower back pain   . Anxiety    Current Outpatient Prescriptions  Medication Sig Dispense Refill  . albuterol (PROVENTIL HFA;VENTOLIN HFA) 108 (90 BASE)  MCG/ACT inhaler Inhale 2 puffs into the lungs every 4 (four) hours as needed. For shortness of breath      . albuterol (PROVENTIL) (2.5 MG/3ML) 0.083% nebulizer solution Take 2.5 mg by nebulization every 6 (six) hours as needed. Shortness of breath      . alendronate (FOSAMAX) 35 MG tablet Take 35 mg by mouth every 7 (seven) days. Take in the morning with a full glass of water, on an empty stomach, and do not take anything else by mouth or lie down for the next 30 min.      . calcium carbonate (OS-CAL) 600 MG TABS Take 600 mg by mouth 2 (two) times daily.      . cholecalciferol (VITAMIN D) 1000 UNITS tablet Take 1,000 Units by mouth daily.      . clemastine (TAVIST) 2.68 MG TABS Take 2.68 mg by mouth 2 (two) times daily.      . DULoxetine (CYMBALTA) 30 MG capsule Take 30-60 mg by mouth daily. Takes 30 mg in the morning and 60 mg in the evenings      . fluticasone (FLONASE) 50 MCG/ACT nasal spray Place 1 spray into the nose daily.      . Fluticasone-Salmeterol (ADVAIR) 250-50 MCG/DOSE AEPB Inhale 1 puff into the lungs 2 (two) times daily.      Marland Kitchen lamoTRIgine (LAMICTAL) 100 MG tablet Take 100-200 mg by mouth 2 (two) times daily.  1 tablet in the morning and 2 tablets at night      . levofloxacin (LEVAQUIN) 750 MG tablet Take 1 tablet (750 mg total) by mouth daily.  6 tablet  0  . OLANZapine (ZYPREXA) 5 MG tablet Take 5 mg by mouth at bedtime.      Marland Kitchen oxyCODONE-acetaminophen (PERCOCET) 10-325 MG per tablet Take 1 tablet by mouth every 6 (six) hours as needed. For pain  120 tablet  0  . predniSONE (DELTASONE) 20 MG tablet Take by mouth with breakfast. Take 3 pills (60mg ) for 2 days, then 2 pills (40mg ) for 3 days, then 1 pill (20mg ) for 3 days. Finish on 11/8.  15 tablet  0  . pseudoephedrine (SUDAFED) 60 MG tablet Take 60 mg by mouth every 4 (four) hours as needed. For congestion      . tiotropium (SPIRIVA) 18 MCG inhalation capsule Place 18 mcg into inhaler and inhale daily.      . [DISCONTINUED]  albuterol (PROVENTIL HFA;VENTOLIN HFA) 108 (90 BASE) MCG/ACT inhaler Inhale 1 puff into the lungs every 4 (four) hours as needed. For wheezing       Family History  Problem Relation Age of Onset  . Stroke Neg Hx   . Cancer Neg Hx    History   Social History  . Marital Status: Single    Spouse Name: N/A    Number of Children: N/A  . Years of Education: N/A   Social History Main Topics  . Smoking status: Current Every Day Smoker -- 0.1 packs/day for 42 years    Types: Cigarettes  . Smokeless tobacco: Never Used     Comment: trying to quit completely - 2 cigs/day.  . Alcohol Use: No  . Drug Use: No  . Sexually Active: None   Other Topics Concern  . None   Social History Narrative  . None   Review of Systems: Review of Systems  Constitutional: Negative.   HENT: Negative.   Eyes: Negative.   Respiratory: Negative for cough, sputum production, shortness of breath and wheezing.   Cardiovascular: Negative for chest pain, palpitations, orthopnea, claudication, leg swelling and PND.  Gastrointestinal: Negative.   Genitourinary: Negative.   Musculoskeletal: Negative.   Neurological: Negative.   Endo/Heme/Allergies: Negative.   Psychiatric/Behavioral: Negative.     Objective:  Physical Exam: Filed Vitals:   01/17/12 1420 01/17/12 1435  BP: 129/65   Pulse: 88   Temp: 100 F (37.8 C)   TempSrc: Oral   Height: 5\' 1"  (1.549 m)   Weight: 87 lb (39.463 kg)   SpO2: 86% 91%   Physical Exam  Constitutional: She is oriented to person, place, and time. No distress.  HENT:  Head: Normocephalic and atraumatic.  Eyes: Conjunctivae normal and EOM are normal. Pupils are equal, round, and reactive to light.  Neck: Normal range of motion. Neck supple.  Cardiovascular: Normal rate, regular rhythm, normal heart sounds and intact distal pulses.  Exam reveals no gallop and no friction rub.   No murmur heard. Pulmonary/Chest: Effort normal and breath sounds normal. No respiratory  distress. She has no wheezes. She has no rales. She exhibits no tenderness.  Abdominal: Soft. Bowel sounds are normal.  Musculoskeletal: She exhibits no edema and no tenderness.  Neurological: She is alert and oriented to person, place, and time. No cranial nerve deficit.  Skin: Skin is warm and dry.  Psychiatric: Affect normal.    Assessment & Plan:   The assessment and plan for the care of  this patient has been discussed with Dr. Rogelia Boga, as detailed, under eahc Problem.  In the brief, the patient has responded well to home oxygen with oxygen saturations being 92% on 2 L min when measured in the clinic. Examination of her lungs is unremarkable without wheezing and with good air entry. The patient is not in respiratory distress. She reports good compliance to oxygen. She has a nursing aid who check on her in her apartment for 2hrs daily. There are no changes that have been made in her medications and she will continue with the rest of her treatments. She will complete her antibiotics and an oral of prednisolone as prescribed on discharge. A complete metabolic panel has been ordered and the patient will be contacted in case of abnormal results.

## 2012-01-18 LAB — COMPREHENSIVE METABOLIC PANEL
ALT: 34 U/L (ref 0–35)
BUN: 9 mg/dL (ref 6–23)
CO2: 31 mEq/L (ref 19–32)
Calcium: 8.7 mg/dL (ref 8.4–10.5)
Creat: 0.52 mg/dL (ref 0.50–1.10)
Glucose, Bld: 99 mg/dL (ref 70–99)
Sodium: 137 mEq/L (ref 135–145)
Total Bilirubin: 0.3 mg/dL (ref 0.3–1.2)
Total Protein: 6 g/dL (ref 6.0–8.3)

## 2012-01-19 NOTE — Progress Notes (Signed)
I saw, examined, and discussed the patient with Dr Kirtland Bouchard and agree with the note contained here. [name] looks so much better today than she has in the past. She has cut down from 2 PPD to 2 cig per day that she smokes outside off O2. No more falls since stopping opioids and benzos. Able to walk with her walker and O2 to nearby grocery store and pharmacy even though it is up hill although she has to stop and sit to rest.

## 2012-02-16 ENCOUNTER — Other Ambulatory Visit: Payer: Self-pay | Admitting: Internal Medicine

## 2012-03-25 ENCOUNTER — Other Ambulatory Visit: Payer: Self-pay | Admitting: Internal Medicine

## 2012-04-30 ENCOUNTER — Encounter: Payer: Self-pay | Admitting: Internal Medicine

## 2012-04-30 ENCOUNTER — Ambulatory Visit (INDEPENDENT_AMBULATORY_CARE_PROVIDER_SITE_OTHER): Payer: Medicare Other | Admitting: Internal Medicine

## 2012-04-30 VITALS — BP 92/50 | HR 78 | Temp 97.9°F | Ht 61.0 in | Wt 89.1 lb

## 2012-04-30 DIAGNOSIS — I959 Hypotension, unspecified: Secondary | ICD-10-CM

## 2012-04-30 DIAGNOSIS — J329 Chronic sinusitis, unspecified: Secondary | ICD-10-CM

## 2012-04-30 DIAGNOSIS — J019 Acute sinusitis, unspecified: Secondary | ICD-10-CM

## 2012-04-30 LAB — BASIC METABOLIC PANEL WITH GFR
BUN: 10 mg/dL (ref 6–23)
CO2: 29 mEq/L (ref 19–32)
Chloride: 95 mEq/L — ABNORMAL LOW (ref 96–112)
Creat: 0.53 mg/dL (ref 0.50–1.10)
Potassium: 4.6 mEq/L (ref 3.5–5.3)

## 2012-04-30 LAB — CBC WITH DIFFERENTIAL/PLATELET
Eosinophils Relative: 2 % (ref 0–5)
HCT: 34.6 % — ABNORMAL LOW (ref 36.0–46.0)
Hemoglobin: 11.8 g/dL — ABNORMAL LOW (ref 12.0–15.0)
Lymphocytes Relative: 41 % (ref 12–46)
Lymphs Abs: 3 10*3/uL (ref 0.7–4.0)
MCV: 95.3 fL (ref 78.0–100.0)
Monocytes Absolute: 0.7 10*3/uL (ref 0.1–1.0)
Monocytes Relative: 9 % (ref 3–12)
Neutro Abs: 3.5 10*3/uL (ref 1.7–7.7)
RBC: 3.63 MIL/uL — ABNORMAL LOW (ref 3.87–5.11)
WBC: 7.3 10*3/uL (ref 4.0–10.5)

## 2012-04-30 MED ORDER — FLUTICASONE PROPIONATE 50 MCG/ACT NA SUSP: 1.0000 | Freq: Two times a day (BID) | NASAL | Status: DC

## 2012-04-30 MED ORDER — AMOXICILLIN-POT CLAVULANATE 875-125 MG PO TABS: 1.0000 | ORAL_TABLET | Freq: Two times a day (BID) | ORAL | Status: DC

## 2012-04-30 MED ORDER — ZOLPIDEM TARTRATE 5 MG PO TABS: 5.0000 mg | ORAL_TABLET | Freq: Every evening | ORAL | Status: DC | PRN

## 2012-04-30 MED ORDER — PSEUDOEPHEDRINE HCL 60 MG PO TABS: 60.0000 mg | ORAL_TABLET | Freq: Four times a day (QID) | ORAL | Status: DC | PRN

## 2012-04-30 MED ORDER — CHLORZOXAZONE 500 MG PO TABS: 500.0000 mg | ORAL_TABLET | Freq: Three times a day (TID) | ORAL | Status: DC | PRN

## 2012-04-30 MED ORDER — OXYCODONE-ACETAMINOPHEN 10-325 MG PO TABS: 1.5000 | ORAL_TABLET | Freq: Four times a day (QID) | ORAL | Status: DC | PRN

## 2012-04-30 NOTE — Progress Notes (Signed)
Patient ID: LYNNANN KNUDSEN, female   DOB: Dec 17, 1950, 62 y.o.   MRN: [id number]  History of present illness: Ms. Stull is a 62 yo woman with PMH of bipolar disorder presents for sinus pressure since Friday. +sore throat, +post nasal drip, +nasal congestion, clear and yellow but not milky.  + coughwith milky clear sputum.  Increased in sputum production.  No fever, +chills but no n/v (able to tolerate food and fluids), abd pain, diarrhea or lightheadedness. +sob but no chest pain.  +headache in sinuses areas.     Review of system: As per history of present illness  Physical examination: General: alert, thin appearing woman in no acute distress and cooperative to examination.  FACE: tenderness to palpation of maxillary and frontal sinuses.  +mild mucopurulent nasal drainage with erythema mucosa.  TM unremarkable bilaterally.  No tonsillar exudates.   Lungs: normal respiratory effort, no accessory muscle use, normal breath sounds, no crackles, and no wheezes. Wearing 2L Stem Heart: normal rate, regular rhythm, no murmur, no gallop, and no rub.  Abdomen: soft, non-tender, normal bowel sounds, no distention, no guarding, no rebound tenderness Neurologic: nonfocal Skin: turgor normal and no rashes.  Psych: appropriate

## 2012-04-30 NOTE — Assessment & Plan Note (Addendum)
BP Readings from Last 3 Encounters:  04/30/12 92/50  01/17/12 129/65  01/12/12 135/65    Lab Results  Component Value Date   NA 137 01/17/2012   K 4.8 01/17/2012   CREATININE 0.52 01/17/2012    Assessment:  Blood pressure control:  soft BP, unclear etiology.  Questionable Infection from sinusitis?  She is completely asymptomatic and is able to tolerate oral intake.  Her SBP is usually in 120's.  Progress toward BP goal:   NO    Plan:  Will check stat CBC and BMP, lactic acid-will call patient back with results  Encourage oral fluid intake  Will see her back in 1 week  Case discussed with attending, Dr. Eben Burow

## 2012-04-30 NOTE — Patient Instructions (Addendum)
Start taking Augmentin one tablet twice daily x 10 days Start using Flonase twice daily for your nose You can take Sudafed for nasal decongestant Follow up in 2 weeks if no improvement

## 2012-04-30 NOTE — Assessment & Plan Note (Signed)
Viral vs bacterial.  Given mucopurulent drainage and tenderness to palpation of her maxillary and frontal sinuses, she may have acute bacterial sinusitis.   Start taking Augmentin one tablet twice daily x 10 days Start using Flonase twice daily  Sudafed for nasal decongestant Follow up in 1 weeks if no improvement or go to the ED if you have nausea or vomiting or increased in shortness of breath

## 2012-05-07 ENCOUNTER — Encounter: Payer: Self-pay | Admitting: Internal Medicine

## 2012-05-07 ENCOUNTER — Ambulatory Visit (HOSPITAL_COMMUNITY)
Admission: RE | Admit: 2012-05-07 | Discharge: 2012-05-07 | Disposition: A | Discharge status: Home / Self Care | Payer: Medicare Other | Source: Ambulatory Visit | Attending: Internal Medicine | Admitting: Internal Medicine

## 2012-05-07 ENCOUNTER — Ambulatory Visit (INDEPENDENT_AMBULATORY_CARE_PROVIDER_SITE_OTHER): Payer: Medicare Other | Admitting: Internal Medicine

## 2012-05-07 VITALS — BP 115/60 | HR 83 | Temp 99.7°F | Ht 61.0 in | Wt 89.4 lb

## 2012-05-07 DIAGNOSIS — I959 Hypotension, unspecified: Secondary | ICD-10-CM

## 2012-05-07 DIAGNOSIS — R059 Cough, unspecified: Secondary | ICD-10-CM | POA: Insufficient documentation

## 2012-05-07 DIAGNOSIS — J329 Chronic sinusitis, unspecified: Secondary | ICD-10-CM

## 2012-05-07 DIAGNOSIS — R05 Cough: Secondary | ICD-10-CM | POA: Insufficient documentation

## 2012-05-07 DIAGNOSIS — J449 Chronic obstructive pulmonary disease, unspecified: Secondary | ICD-10-CM | POA: Insufficient documentation

## 2012-05-07 DIAGNOSIS — J4489 Other specified chronic obstructive pulmonary disease: Secondary | ICD-10-CM | POA: Insufficient documentation

## 2012-05-07 MED ORDER — AMOXICILLIN-POT CLAVULANATE 875-125 MG PO TABS: 1.0000 | ORAL_TABLET | Freq: Two times a day (BID) | ORAL | Status: DC

## 2012-05-07 NOTE — Assessment & Plan Note (Signed)
Resolved. Labs drawn on 2/17 were normal, normal lactic acid level

## 2012-05-07 NOTE — Progress Notes (Signed)
Patient ID: [name], female   DOB: Jul 30, 1950, 62 y.o.   MRN: [id number] History of present illness: Ms. [name] is a 62 yo woman who is well known to me with hx of bipolar, copd on home O2: 2L [place]  presents today for follow up.  She was seen on 2/17 for sinusitis and has finished 8/10 days of Augmentin. She still has 2 more days left.  Still not feeling good, aching all over and feels like she went to sleep all the time.  Feel sluggish, head hurts, stuff up with clear nasal drainage, drainage in back of throat. Coughing, bring up clear sputum (much more than usual). No fever or chills.  Baseline SOB. BP last visit was slight soft so she is here to recheck BP.   ROS: as per HPI  PE; General: alert, thin appearing but nad, and cooperative to examination.  HEENT: Erythematous nasal mucosa with mild purulent drainage, no oral exudate noted, tympanic membrane unremarkable. + mild tenderness to palpation of maxillary/ frontal sinuses. Lungs: normal respiratory effort, no accessory muscle use, decreased breath sounds, no crackles, and no wheezes. Heart: normal rate, regular rhythm, no murmur, no gallop, and no rub.  Abdomen: soft, non-tender, normal bowel sounds, no distention, no guarding, no rebound tenderness Neurologic: nonfocal Skin: turgor normal and no rashes.  Psych: appropriate

## 2012-05-07 NOTE — Assessment & Plan Note (Signed)
She has only taken 8 days of Augmentin.  I think she may have superimposed bacterial in addition to viral. -Continue 6 more days of Abx -Continue using flonase -Sudafed for nasal decongestant -Will get CXR to rule out pna

## 2012-05-07 NOTE — Patient Instructions (Addendum)
Will get chest xray today and I will call you with any abnormal results Take 6 more days of antibiotics: Augmentin 875mg  one tablet twice daily Follow up with Dr. Anselm Jungling in 1-2 weeks if no improvement

## 2012-05-15 ENCOUNTER — Ambulatory Visit (INDEPENDENT_AMBULATORY_CARE_PROVIDER_SITE_OTHER): Payer: Medicare Other | Admitting: Internal Medicine

## 2012-05-15 ENCOUNTER — Encounter: Payer: Self-pay | Admitting: Internal Medicine

## 2012-05-15 VITALS — BP 107/55 | HR 76 | Temp 98.2°F | Wt 91.7 lb

## 2012-05-15 DIAGNOSIS — J329 Chronic sinusitis, unspecified: Secondary | ICD-10-CM

## 2012-05-15 DIAGNOSIS — Z139 Encounter for screening, unspecified: Secondary | ICD-10-CM

## 2012-05-15 DIAGNOSIS — J449 Chronic obstructive pulmonary disease, unspecified: Secondary | ICD-10-CM

## 2012-05-15 MED ORDER — DOXYCYCLINE HYCLATE 100 MG PO TABS: 100.0000 mg | ORAL_TABLET | Freq: Two times a day (BID) | ORAL | Status: DC

## 2012-05-15 MED ORDER — PREDNISONE 20 MG PO TABS: ORAL_TABLET | ORAL | Status: DC

## 2012-05-15 NOTE — Progress Notes (Signed)
Patient ID: [name], female   DOB: November 09, 1950, 62 y.o.   MRN: [id number] History of present illness: [name] is a 62 yo woman who is well known to me with hx of bipolar, copd on home O2: 2L [place] presents today for follow up. She was seen on 2/17 for sinusitis and has finished 8/10 days of Augmentin. She was then seen on 2/24 because her symptoms did not improve. She was instructed to finish 6 more days of Augmentin for total of 2 weeks. She presents today because her symptoms have been persistent and that she still feel the frontal as well as maxillary headache along with nasal congestion and drainage. She feels more short of breath and has been using her inhaler 4-5 times daily in addition to her nebulizers. She has a productive sputum that is milky in color. She continues to feel sluggish but denies any fever or chills. O2 sat was 97% on 2 L nasal cannula. She is currently not short of breath during office visit.  ROS: as per HPI   PE;  General: alert, thin appearing but nad, and cooperative to examination.  HEENT: Erythematous nasal mucosa with mild purulent drainage, no oral exudate noted, tympanic membrane unremarkable. + tenderness to palpation of maxillary/ frontal sinuses. Lungs: normal respiratory effort, no accessory muscle use, decreased breath sounds, no crackles, and + mild wheezes diffusely  Heart: normal rate, regular rhythm, no murmur, no gallop, and no rub.  Abdomen: soft, non-tender, normal bowel sounds, no distention, no guarding, no rebound tenderness Neurologic: nonfocal Skin: turgor normal and no rashes.  Psych: appropriate

## 2012-05-15 NOTE — Assessment & Plan Note (Signed)
Will need to schedule for a mammogram as well as a Pap smear

## 2012-05-15 NOTE — Assessment & Plan Note (Signed)
Persistent sinusitis despite 2 courses of Augmentin and nasal steroids. I will switch to a different antibiotic class to see if this will help her symptoms. Next up would be refer her to ENT and she may need a CT of sinus. -Doxycycline 100 mg by mouth twice a day x10 days -Prednisone taper course x10 days -Continue Flonase spray - Saline rinse prior to using Flonase nasal spray for better absorption of the medication -Will refer her to ENT -Patient is to followup in 2 weeks if no improved -Case discussed with attending, Dr. Criselda Peaches

## 2012-05-15 NOTE — Assessment & Plan Note (Addendum)
Patient has mild wheezing on lung exam. She also has been using inhalers 4-5 times per day in addition to her nebulizers. She does report some shortness of breath but not currently in the office. O2 sat was 97% on 2 L nasal cannula which is baseline for her. Recent chest x-ray on February 24 did not show any infiltrates therefore will not repeat chest x-ray today. -Will treat as mild COPD exacerbation as outpatient -Prednisone taper course x10 days -Doxycycline 100 mg by mouth twice a day x10 days -Patient is to follow up in 2-3 weeks if not improved

## 2012-05-15 NOTE — Patient Instructions (Addendum)
Start taking doxycycline 100 mg twice daily for 10 day Start taking prednisone as prescribed We'll refer you to ears nose and throat doctor Followup in 2 weeks if no improvement

## 2012-05-24 ENCOUNTER — Other Ambulatory Visit: Payer: Self-pay | Admitting: *Deleted

## 2012-05-24 DIAGNOSIS — J449 Chronic obstructive pulmonary disease, unspecified: Secondary | ICD-10-CM

## 2012-05-24 MED ORDER — FLUTICASONE-SALMETEROL 250-50 MCG/DOSE IN AEPB: 1.0000 | INHALATION_SPRAY | Freq: Two times a day (BID) | RESPIRATORY_TRACT | Status: DC

## 2012-06-05 ENCOUNTER — Encounter: Payer: Self-pay | Admitting: Internal Medicine

## 2012-06-05 ENCOUNTER — Ambulatory Visit (INDEPENDENT_AMBULATORY_CARE_PROVIDER_SITE_OTHER): Payer: Medicare Other | Admitting: Internal Medicine

## 2012-06-05 VITALS — BP 113/65 | HR 82 | Temp 98.3°F | Ht 61.0 in | Wt 91.6 lb

## 2012-06-05 DIAGNOSIS — Z139 Encounter for screening, unspecified: Secondary | ICD-10-CM

## 2012-06-05 DIAGNOSIS — Z8659 Personal history of other mental and behavioral disorders: Secondary | ICD-10-CM

## 2012-06-05 DIAGNOSIS — J449 Chronic obstructive pulmonary disease, unspecified: Secondary | ICD-10-CM

## 2012-06-05 DIAGNOSIS — J329 Chronic sinusitis, unspecified: Secondary | ICD-10-CM

## 2012-06-05 MED ORDER — AZITHROMYCIN 250 MG PO TABS: ORAL_TABLET | ORAL | Status: AC

## 2012-06-05 MED ORDER — CHLORPHENIRAMINE MALEATE 4 MG PO TABS: 4.0000 mg | ORAL_TABLET | Freq: Four times a day (QID) | ORAL | Status: DC | PRN

## 2012-06-05 MED ORDER — SODIUM CHLORIDE 0.9 % IV SOLN: 125.0000 mg | Freq: Once | INTRAVENOUS | Status: DC

## 2012-06-05 MED ORDER — METHYLPREDNISOLONE SODIUM SUCC 125 MG IJ SOLR
125.0000 mg | Freq: Once | INTRAMUSCULAR | Status: AC
  Administered 2012-06-05: 125 mg via INTRAMUSCULAR

## 2012-06-05 MED ORDER — PREDNISONE 20 MG PO TABS: ORAL_TABLET | ORAL | Status: DC

## 2012-06-05 NOTE — Assessment & Plan Note (Addendum)
Patient has increase in coughing as well as sputum reduction in yellow in color, increase in shortness of breath and wheezing on lung exam. She is not hypoxic, O2 sat 94% on 2 L nasal cannula. Although patient has failed outpatient treatment, she does not want to get admitted to the hospital and does not want to get another chest x-ray today. But she does know to go to the ED if she becomes more short of breath at home. Gave IM solumedrol 125mg  in office -Will start Z-Pak x5 days -Prednisone tapering course x10 days -Albuterol inhaler, nebulizers -Will also try chlorpheniramine to help herr With the runny nose as it has anticholinergic effect -Continue O2 supplementation at home -Consider CT chest and sinus if no improvement

## 2012-06-05 NOTE — Addendum Note (Signed)
Addended by: Angelina Ok F on: 06/05/2012 04:51 PM   Modules accepted: Orders

## 2012-06-05 NOTE — Assessment & Plan Note (Addendum)
Patient has tried Augmentin as well as doxycycline in the past 2-3 weeks without improvement in addition to Prednisone taper course. Patient continues to report copious nasal drainage which could have caused her to have cough and wheezing. -I will try chlorphentermine to help dry her out -We'll also send her to ENT- she was not able to get an appointment with ENT last week because her insurance had a different provider -She is currently on Flonase spray at home

## 2012-06-05 NOTE — Addendum Note (Signed)
Addended by: Maura Crandall on: 06/05/2012 03:51 PM   Modules accepted: Orders

## 2012-06-05 NOTE — Patient Instructions (Addendum)
Take Zpak as prescribed Take Prednisone as prescribed Will refer to ENT doctor Please go to the ED if you become more short of breath Follow up in 2 weeks

## 2012-06-05 NOTE — Assessment & Plan Note (Signed)
Will need pap next visit Schedule for mammogram and screening colonoscopy

## 2012-06-05 NOTE — Progress Notes (Signed)
Patient ID: [name], female   DOB: 21-Jun-1950, 62 y.o.   MRN: [id number] HPI: Ms. [name] is here for follow up on her sinusitis. Patient states "I still feel lousy".  COughing more, with yellow sputum.  No chills or fever, but more SOB.  Using albuterol inhaler every 4 hrs and nebs 3 times per day. Nose is still runny.  She has not seen ENT yet. She completed doxy and prednisone 10 days ago. CXR 1 month ago did not show any infiltrate.  Patient does not want another CXR today.    ROS: as per HPI  PE: General: alert, well-developed, and cooperative to examination.  HEENT: white plagues in her nasal mucosa., mild erythema.  No oral exudates Lungs: normal respiratory effort, no accessory muscle use, normal breath sounds, no crackles, and + scattered wheezes throughout Heart: normal rate, regular rhythm, no murmur, no gallop, and no rub.  Abdomen: soft, non-tender, normal bowel sounds, no distention, no guarding, no rebound tenderness Neurologic: nonfocal Skin: turgor normal and no rashes.  Psych: appropriate

## 2012-06-06 ENCOUNTER — Telehealth: Payer: Self-pay | Admitting: Licensed Clinical Social Worker

## 2012-06-06 NOTE — Telephone Encounter (Signed)
Ms. Derocher was referred to CSW for additional information on Assisted Living.  Ms. Tashiro states she receives PCS daily to assist with housekeeping and bathing.  Pt states she had a fall some time ago in the bathtub which she had a 3 month SNF placement, and has made pt fearful of getting in/out of bathtub.  Ms. Iannelli states she is independent with managing her medications and appointments.  Pt inquired if her current living facility is considered independent living.  Pt states she pays monthly rent and receives no services from apt complex.  From pt's explanation, this is independent living with PCS daily.  CSW informed Ms. Elvin [facility] facilities offer nursing staff and add'l services.  Pt states she is comfortable with her current living situation and thanked CSW for clarifying difference between independent living and assisted living.  CSW informed Ms. Chirino if and when she is in need of add'l resources pt is eligible for [facility] care management.  Pt aware and denies add'l needs at this time.  CSW will sign off.

## 2012-06-19 ENCOUNTER — Ambulatory Visit (INDEPENDENT_AMBULATORY_CARE_PROVIDER_SITE_OTHER): Payer: Medicare Other | Admitting: Internal Medicine

## 2012-06-19 ENCOUNTER — Encounter: Payer: Self-pay | Admitting: Internal Medicine

## 2012-06-19 VITALS — BP 112/58 | HR 77 | Temp 99.8°F | Ht 61.0 in | Wt 88.3 lb

## 2012-06-19 DIAGNOSIS — J329 Chronic sinusitis, unspecified: Secondary | ICD-10-CM

## 2012-06-19 MED ORDER — CETIRIZINE HCL 10 MG PO TABS: 10.0000 mg | ORAL_TABLET | Freq: Every day | ORAL | Status: DC

## 2012-06-19 NOTE — Progress Notes (Signed)
Subjective:   Patient ID: [name] female   DOB: 1950/03/27 62 y.o.   MRN: [id number]  HPI: [name] is a 62 y.o.woman with PMH of HTN, COPD on home O2 2L, arthritis, chronic pain syndrome,  depression, and bipolar disorder, who presents to the clinic for followup  She was evaluated and treated for persistent sinusitis since 04/30/12.   Sinusitis evaluation as follows. 04/30/12--14 days of Augmentin 05/07/12--CXR indicated COPD. No active disease 05/15/12-- Doxycycline 10 days, prednisone taper x 10 days, ENT referral 06/04/12--Z pack x 5 d, prednisone taper x 10 d, Albuterol inhaler, chlorpheniramine  Patient reports no improvement/worsening of her symptoms after she finished the treatment prescribed on 05/25/12.  She reports that she feels tired and "drained". Still has sinus congestion and pressure.  C/o postnasal drip and worsening of cough/wheezing in the morning. Productive cough with moderate beige and yellow thick sputum. C/o similar drainage from her nostrils.   States that she has increased wheezing and SOB mainly when she lies down.  Used more albuterol inhaler 3-4 times daily.  Denies headache,  fever, chill, night sweats, chest pain, chest pressure, nausea, vomiting, abdominal pain. Decreased appetite and 3 lbs weight loss.    Patient states that she has allergy problem and usually takes OTC Claritin. She has not taken any anti-allergy medication this year. She was given chlorpheniramine on 06/04/12 and states that it causes her dry mouth. She would like to try a different allergy medication.    Past Medical History  Diagnosis Date  . Depression   . Hypertension   . Suicidal ideation 2007     attempted overdose 2012  Dr. Gwendalyn Ege report  . Bipolar disorder   . Substance abuse      narcotics, alcohol, tobacco  . COPD (chronic obstructive pulmonary disease)     not on home oxygen  . Chronic pain syndrome     follows at pain managment  . DEFICIENCY, VITAMIN D  NOS 01/03/2007  . Shortness of breath     "all the time" (01/10/2012)  . Arthritis     "legs, back" (01/10/2012)  . Chronic lower back pain   . Anxiety    Current Outpatient Prescriptions  Medication Sig Dispense Refill  . albuterol (PROVENTIL HFA;VENTOLIN HFA) 108 (90 BASE) MCG/ACT inhaler Inhale 2 puffs into the lungs every 4 (four) hours as needed. For shortness of breath      . albuterol (PROVENTIL) (2.5 MG/3ML) 0.083% nebulizer solution Take 2.5 mg by nebulization every 6 (six) hours as needed. Shortness of breath      . alendronate (FOSAMAX) 35 MG tablet Take 35 mg by mouth every 7 (seven) days. Take in the morning with a full glass of water, on an empty stomach, and do not take anything else by mouth or lie down for the next 30 min.      . calcium carbonate (OS-CAL) 600 MG TABS Take 600 mg by mouth 2 (two) times daily.      . cetirizine (ZYRTEC) 10 MG tablet Take 1 tablet (10 mg total) by mouth daily.  30 tablet  4  . chlorpheniramine (SB CHLORPHENIRAMINE) 4 MG tablet Take 1 tablet (4 mg total) by mouth every 6 (six) hours as needed for allergies or rhinitis.  30 tablet  0  . chlorzoxazone (PARAFON) 500 MG tablet Take 1 tablet (500 mg total) by mouth 3 (three) times daily as needed for muscle spasms.  30 tablet  0  . cholecalciferol (VITAMIN D) 1000  UNITS tablet Take 1,000 Units by mouth daily.      . clemastine (TAVIST) 2.68 MG TABS Take 2.68 mg by mouth 2 (two) times daily.      . DULoxetine (CYMBALTA) 30 MG capsule Take 30-60 mg by mouth daily. Takes 30 mg in the morning and 60 mg in the evenings      . fluticasone (FLONASE) 50 MCG/ACT nasal spray Place 1 spray into the nose 2 (two) times daily.  16 g  1  . Fluticasone-Salmeterol (ADVAIR) 250-50 MCG/DOSE AEPB Inhale 1 puff into the lungs 2 (two) times daily.  60 each  11  . lamoTRIgine (LAMICTAL) 100 MG tablet Take 100-200 mg by mouth 2 (two) times daily. 1 tablet in the morning and 2 tablets at night      . OLANZapine (ZYPREXA) 5 MG  tablet Take 5 mg by mouth at bedtime.      Marland Kitchen oxyCODONE-acetaminophen (PERCOCET) 10-325 MG per tablet Take 1.5 tablets by mouth every 6 (six) hours as needed for pain. For pain  120 tablet  0  . predniSONE (DELTASONE) 20 MG tablet Take 5 tablets daily x 2 days, then 4 tablet daily x 2 days, then 3 tablets daily x 2 days, then 2 tablets daily x 2, then 1 tablet daily x 2 days  30 tablet  0  . PROAIR HFA 108 (90 BASE) MCG/ACT inhaler INHALE 1 PUFF IN MOUTH EVERY 4 HOURS AS NEEDED FOR WHEEZING  8.5 each  2  . PROAIR HFA 108 (90 BASE) MCG/ACT inhaler INHALE 1 PUFF IN MOUTH EVERY 4 HOURS AS NEEDED FOR WHEEZING  8.5 each  2  . tiotropium (SPIRIVA) 18 MCG inhalation capsule Place 18 mcg into inhaler and inhale daily.      Marland Kitchen zolpidem (AMBIEN) 5 MG tablet Take 1 tablet (5 mg total) by mouth at bedtime as needed for sleep.  30 tablet  0   No current facility-administered medications for this visit.   Family History  Problem Relation Age of Onset  . Stroke Neg Hx   . Cancer Neg Hx    History   Social History  . Marital Status: Single    Spouse Name: N/A    Number of Children: N/A  . Years of Education: N/A   Social History Main Topics  . Smoking status: Current Every Day Smoker -- 0.12 packs/day for 42 years    Types: Cigarettes  . Smokeless tobacco: Never Used     Comment: trying to quit completely - 2 cigs/day.  . Alcohol Use: No  . Drug Use: No  . Sexually Active: None   Other Topics Concern  . None   Social History Narrative  . None   Review of Systems: Review of Systems:  Constitutional:  Denies fever, chills, diaphoresis, positive appetite change and fatigue.   HEENT:  positive congestion,  Rhinorrhea, denies sneezing, mouth sores, trouble swallowing, neck pain   Respiratory:  positive SOB, cough, and wheezing.   Cardiovascular:  Denies palpitations and leg swelling.   Gastrointestinal:  Denies nausea, vomiting, abdominal pain, diarrhea, constipation, blood in stool and  abdominal distention.   Genitourinary:  Denies dysuria, urgency, frequency, hematuria, flank pain and difficulty urinating.   Musculoskeletal:  Denies myalgias, back pain, joint swelling, arthralgias and gait problem.   Skin:  Denies pallor, rash and wound.   Neurological:  Denies dizziness, seizures, syncope, weakness, light-headedness, numbness and headaches.    .    Objective:  Physical Exam: Filed Vitals:  06/19/12 1351  BP: 112/58  Pulse: 77  Temp: 99.8 F (37.7 C)  TempSrc: Oral  Height: 5\' 1"  (1.549 m)  Weight: 88 lb 4.8 oz (40.053 kg)  SpO2: 100%   General: alert, well-developed, and cooperative to examination.  HEENT: Erythematous nasal mucosa with mild purulent drainage, no oral exudate noted, tympanic membrane unremarkable. + tenderness to palpation of maxillary/ frontal sinuses. Lungs: normal respiratory effort, no accessory muscle use, normal breath sounds, no crackles, and no wheezes. Heart: normal rate, regular rhythm, no murmur, no gallop, and no rub.  Abdomen: soft, non-tender, normal bowel sounds, no distention, no guarding, no rebound tenderness, no hepatomegaly, and no splenomegaly.  Msk: no joint swelling, no joint warmth, and no redness over joints.  Pulses: 2+ DP/PT pulses bilaterally Extremities: No cyanosis, clubbing, edema Neurologic: alert & oriented X3, cranial nerves II-XII intact, strength normal in all extremities, sensation intact to light touch, and gait normal.  Skin: turgor normal and no rashes.  Psych: Oriented X3, memory intact for recent and remote, normally interactive, good eye contact, not anxious appearing, and not depressed appearing.    Assessment & Plan:

## 2012-06-19 NOTE — Patient Instructions (Addendum)
1. Will try Zyrtec 10 mg daily for one week. 2. You will need sinus CT if no improvement. Please call your PCP Dr. Anselm Jungling in one week.   Cetirizine tablets What is this medicine? CETIRIZINE (se TI ra zeen) is an antihistamine. This medicine is used to treat or prevent symptoms of allergies. It is also used to help reduce itchy skin rash and hives. This medicine may be used for other purposes; ask your health care provider or pharmacist if you have questions. What should I tell my health care provider before I take this medicine? They need to know if you have any of these conditions: -kidney disease -liver disease -an unusual or allergic reaction to cetirizine, hydroxyzine, other medicines, foods, dyes, or preservatives -pregnant or trying to get pregnant -breast-feeding How should I use this medicine? Take this medicine by mouth with a glass of water. Follow the directions on the prescription label. You can take this medicine with food or on an empty stomach. Take your medicine at regular times. Do not take more often than directed. You may need to take this medicine for several days before your symptoms improve. Talk to your pediatrician regarding the use of this medicine in children. Special care may be needed. While this drug may be prescribed for children as young as 31 years of age for selected conditions, precautions do apply. Overdosage: If you think you have taken too much of this medicine contact a poison control center or emergency room at once. NOTE: This medicine is only for you. Do not share this medicine with others. What if I miss a dose? If you miss a dose, take it as soon as you can. If it is almost time for your next dose, take only that dose. Do not take double or extra doses. What may interact with this medicine? -other medicines for colds or allergies -theophylline This list may not describe all possible interactions. Give your health care provider a list of all the medicines,  herbs, non-prescription drugs, or dietary supplements you use. Also tell them if you smoke, drink alcohol, or use illegal drugs. Some items may interact with your medicine. What should I watch for while using this medicine? Visit your doctor or health care professional for regular checks on your health. Tell your doctor if your symptoms do not improve. You may get drowsy or dizzy. Do not drive, use machinery, or do anything that needs mental alertness until you know how this medicine affects you. Do not stand or sit up quickly, especially if you are an older patient. This reduces the risk of dizzy or fainting spells. Your mouth may get dry. Chewing sugarless gum or sucking hard candy, and drinking plenty of water may help. Contact your doctor if the problem does not go away or is severe. What side effects may I notice from receiving this medicine? Side effects that you should report to your doctor or health care professional as soon as possible: -allergic reactions like skin rash, itching or hives, swelling of the face, lips, or tongue -changes in vision or hearing -fast heartbeat -high blood pressure -infection -trouble passing urine or change in the amount of urine Side effects that usually do not require medical attention (report to your doctor or health care professional if they continue or are bothersome): -irritability -loss of sleep -sore throat -stomach pain -swelling This list may not describe all possible side effects. Call your doctor for medical advice about side effects. You may report side effects to  FDA at 1-800-FDA-1088. Where should I keep my medicine? Keep out of the reach of children. Store at room temperature between 15 and 30 degrees C (59 and 86 degrees F). Throw away any unused medicine after the expiration date. NOTE: This sheet is a summary. It may not cover all possible information. If you have questions about this medicine, talk to your doctor, pharmacist, or health  care provider.  2013, [employer]. (05/07/2007 6:28:02 PM)

## 2012-06-19 NOTE — Assessment & Plan Note (Addendum)
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  She has had nasal saline, intranasal glucocorticoid, oral glucocorticoid, antibiotics and antihistamine without much improvement. I discussed the management of chronic sinusitis with her. Given her clinical manifestations along with tenderness over her facial sinuses, I discussed about the sinus CT for further diagnosis and ENT referral since she failed medical treatment.   Patient states that she has dry mouth, which is likely from anticholinergic effects 2/2 chlorpheniramine. She would like to try 2nd generation of antihistamine like zyrtec for another week before she would consider sinus CT scan.   -I will prescribe Zyrtec for her to try for another week. -She is instructed to come to the clinic if her symptoms worsen, or she develops fever, chills, and worsening of SOB. Otherwise she can follow up with her PCP Dr. Anselm Jungling in one week. Patient understands above plan.  - ENT referral is in process. We do not have an appt yet.

## 2012-06-23 ENCOUNTER — Other Ambulatory Visit: Payer: Self-pay | Admitting: Internal Medicine

## 2012-06-27 ENCOUNTER — Ambulatory Visit (INDEPENDENT_AMBULATORY_CARE_PROVIDER_SITE_OTHER): Payer: Medicare Other | Admitting: Radiation Oncology

## 2012-06-27 ENCOUNTER — Encounter: Payer: Self-pay | Admitting: Radiation Oncology

## 2012-06-27 ENCOUNTER — Ambulatory Visit: Payer: Medicare Other | Admitting: Radiation Oncology

## 2012-06-27 VITALS — BP 114/58 | HR 83 | Temp 98.0°F | Ht 61.0 in

## 2012-06-27 DIAGNOSIS — J4489 Other specified chronic obstructive pulmonary disease: Secondary | ICD-10-CM

## 2012-06-27 DIAGNOSIS — J449 Chronic obstructive pulmonary disease, unspecified: Secondary | ICD-10-CM

## 2012-06-27 DIAGNOSIS — J329 Chronic sinusitis, unspecified: Secondary | ICD-10-CM

## 2012-06-27 MED ORDER — FLUTICASONE PROPIONATE 50 MCG/ACT NA SUSP: 1.0000 | Freq: Two times a day (BID) | NASAL | Status: DC

## 2012-06-27 MED ORDER — PREDNISONE 20 MG PO TABS: 40.0000 mg | ORAL_TABLET | Freq: Every day | ORAL | Status: DC

## 2012-06-27 NOTE — Assessment & Plan Note (Signed)
Pt's symptoms persist with some slight worsening. Will pursue diagnostic imaging at this time, as to most effectively treat the patient for chronic rhinosinusitis it will be necessary to identify whether or not it is associated with polyposis, as this would change management.  - CT sinus - prednisone 40mg  x 5d => will update pt with CT results and amend therapy as indicated - flonase bid (pt was not previously using this medication) - cont zyrtec - will start antibiotics x 3 wks if CT does not reveal polyps

## 2012-06-27 NOTE — Progress Notes (Signed)
Pt aware of appt CT [facility] 06/29/12 10:30AM. [facility] Timiya Howells RN 06/27/12 10:50AM

## 2012-06-27 NOTE — Progress Notes (Signed)
Agree with note and plans . 

## 2012-06-27 NOTE — Progress Notes (Signed)
Subjective:    Patient ID: [name], female    DOB: Jul 29, 1950, 62 y.o.   MRN: [id number]  HPI Patient is a 62 year old woman with PMH significant for COPD requiring 2 L home O2 (24 hours a day) who presents to clinic today for followup of complaints consistent with sinusitis. She has had these complaints since 04/2012, however she states she has milder baseline sinus congestion and postnasal drip for at least 2 years. Since 04/30/2012 when her symptoms began to worsen, she has been prescribed 2 courses of oral steroids as well as a 14 day course of Augmentin, a ten-day course of doxycycline, and a five-day course of azithromycin.   She has not taken any steroids or antibiotics for approximately 2 weeks as of today's visit, and states her symptoms are worsening. She complains of facial pressure and postnasal drainage, but also admits to increased shortness of breath, wheezing, and increased cough with sputum productive of yellow/brown material. She has been using her albuterol inhaler more frequently at home.   Review of Systems  All other systems reviewed and are negative.   Current Outpatient Medications: Current Outpatient Prescriptions  Medication Sig Dispense Refill  . albuterol (PROVENTIL HFA;VENTOLIN HFA) 108 (90 BASE) MCG/ACT inhaler Inhale 2 puffs into the lungs every 4 (four) hours as needed. For shortness of breath      . albuterol (PROVENTIL) (2.5 MG/3ML) 0.083% nebulizer solution Take 2.5 mg by nebulization every 6 (six) hours as needed. Shortness of breath      . alendronate (FOSAMAX) 35 MG tablet Take 35 mg by mouth every 7 (seven) days. Take in the morning with a full glass of water, on an empty stomach, and do not take anything else by mouth or lie down for the next 30 min.      . calcium carbonate (OS-CAL) 600 MG TABS Take 600 mg by mouth 2 (two) times daily.      . cetirizine (ZYRTEC) 10 MG tablet Take 1 tablet (10 mg total) by mouth daily.  30 tablet  4  . chlorzoxazone  (PARAFON) 500 MG tablet Take 1 tablet (500 mg total) by mouth 3 (three) times daily as needed for muscle spasms.  30 tablet  0  . cholecalciferol (VITAMIN D) 1000 UNITS tablet Take 1,000 Units by mouth daily.      . clemastine (TAVIST) 2.68 MG TABS Take 2.68 mg by mouth 2 (two) times daily.      . DULoxetine (CYMBALTA) 30 MG capsule Take 30-60 mg by mouth daily. Takes 30 mg in the morning and 60 mg in the evenings      . fluticasone (FLONASE) 50 MCG/ACT nasal spray Place 1 spray into the nose 2 (two) times daily.  16 g  1  . Fluticasone-Salmeterol (ADVAIR) 250-50 MCG/DOSE AEPB Inhale 1 puff into the lungs 2 (two) times daily.  60 each  11  . lamoTRIgine (LAMICTAL) 100 MG tablet Take 100-200 mg by mouth 2 (two) times daily. 1 tablet in the morning and 2 tablets at night      . OLANZapine (ZYPREXA) 5 MG tablet Take 5 mg by mouth at bedtime.      Marland Kitchen oxyCODONE-acetaminophen (PERCOCET) 10-325 MG per tablet Take 1.5 tablets by mouth every 6 (six) hours as needed for pain. For pain  120 tablet  0  . predniSONE (DELTASONE) 20 MG tablet Take 2 tablets (40 mg total) by mouth daily.  10 tablet  0  . PROAIR HFA 108 (90 BASE) MCG/ACT inhaler  INHALE 1 PUFF IN MOUTH EVERY 4 HOURS AS NEEDED FOR WHEEZING  8.5 each  2  . PROAIR HFA 108 (90 BASE) MCG/ACT inhaler INHALE 1 PUFF IN MOUTH EVERY 4 HOURS AS NEEDED FOR WHEEZING  8.5 each  2  . tiotropium (SPIRIVA) 18 MCG inhalation capsule Place 18 mcg into inhaler and inhale daily.      Marland Kitchen zolpidem (AMBIEN) 5 MG tablet Take 1 tablet (5 mg total) by mouth at bedtime as needed for sleep.  30 tablet  0   No current facility-administered medications for this visit.    Allergies: Allergies  Allergen Reactions  . Banana Nausea And Vomiting     Past Medical History: Past Medical History  Diagnosis Date  . Depression   . Hypertension   . Suicidal ideation 2007     attempted overdose 2012  Dr. Gwendalyn Ege report  . Bipolar disorder   . Substance abuse      narcotics,  alcohol, tobacco  . COPD (chronic obstructive pulmonary disease)     not on home oxygen  . Chronic pain syndrome     follows at pain managment  . DEFICIENCY, VITAMIN D NOS 01/03/2007  . Shortness of breath     "all the time" (01/10/2012)  . Arthritis     "legs, back" (01/10/2012)  . Chronic lower back pain   . Anxiety     Past Surgical History: Past Surgical History  Procedure Laterality Date  . Ankle surgery  11/16/2010    s/p ORIF; left  . Cesarean section  59; 1979    Family History: Family History  Problem Relation Age of Onset  . Stroke Neg Hx   . Cancer Neg Hx     Social History: History   Social History  . Marital Status: Single    Spouse Name: N/A    Number of Children: N/A  . Years of Education: N/A   Occupational History  . Not on file.   Social History Main Topics  . Smoking status: Current Every Day Smoker -- 0.12 packs/day for 42 years    Types: Cigarettes  . Smokeless tobacco: Never Used     Comment: trying to quit completely - 2 cigs/day.  . Alcohol Use: No  . Drug Use: No  . Sexually Active: Not on file   Other Topics Concern  . Not on file   Social History Narrative  . No narrative on file     Vital Signs: Blood pressure 114/58, pulse 83, temperature 98 F (36.7 C), temperature source Oral, height 5\' 1"  (1.549 m), SpO2 95.00%.      Objective:   Physical Exam  Constitutional: She is oriented to person, place, and time. She appears well-developed and well-nourished.   in place.   HENT:  Head: Normocephalic and atraumatic.  Nasal turbinates appear normal, with no injection or erythema. Cobblestoning of posterior oropharynx. TTP over maxillary and frontal sinuses.   Eyes: Conjunctivae are normal. Pupils are equal, round, and reactive to light.  Neck: Normal range of motion. Neck supple. No tracheal deviation present.  Cardiovascular: Normal rate and regular rhythm.   No murmur heard. Pulmonary/Chest: Effort normal. She has  decreased breath sounds. She has no wheezes. She has no rales.  Abdominal: Soft. Bowel sounds are normal. She exhibits no distension. There is no tenderness.  Musculoskeletal: Normal range of motion. She exhibits no edema.  Neurological: She is alert and oriented to person, place, and time. No cranial nerve deficit.  Skin: Skin is  warm and dry. No erythema.  Psychiatric: She has a normal mood and affect. Her behavior is normal.          Assessment & Plan:

## 2012-06-27 NOTE — Patient Instructions (Addendum)
Please take prednisone as prescribed today--you will complete a 5 day course unless we call you with instructions to continue this medication for longer.   Begin using your flonase nasal spray twice daily in each nostril as discussed during your visit.   We will contact you with the results of your CT scan.   It was a pleasure to meet you. Have a great day.

## 2012-06-27 NOTE — Assessment & Plan Note (Signed)
Pt is having a mild COPD exacerbation 2/2 sinusitis. SOB is only minimally increased from baseline. As there is no evidence of pulmonary infection given the source of her complaints are related to sinus disease, no antibiotics were prescribed today for COPD exacerbation, despite having 3/3 cardinal symptoms.  - prednisone 40mg  x 5d - cont albuterol - cont advair - cont home O2

## 2012-06-29 ENCOUNTER — Ambulatory Visit (HOSPITAL_COMMUNITY)
Admission: RE | Admit: 2012-06-29 | Discharge: 2012-06-29 | Disposition: A | Discharge status: Home / Self Care | Payer: Medicare Other | Source: Ambulatory Visit | Attending: Internal Medicine | Admitting: Internal Medicine

## 2012-06-29 DIAGNOSIS — J342 Deviated nasal septum: Secondary | ICD-10-CM | POA: Insufficient documentation

## 2012-06-29 DIAGNOSIS — D164 Benign neoplasm of bones of skull and face: Secondary | ICD-10-CM | POA: Insufficient documentation

## 2012-06-29 DIAGNOSIS — J329 Chronic sinusitis, unspecified: Secondary | ICD-10-CM | POA: Insufficient documentation

## 2012-07-09 ENCOUNTER — Ambulatory Visit (INDEPENDENT_AMBULATORY_CARE_PROVIDER_SITE_OTHER): Payer: Medicare Other | Admitting: Internal Medicine

## 2012-07-09 ENCOUNTER — Encounter: Payer: Self-pay | Admitting: Internal Medicine

## 2012-07-09 VITALS — BP 128/64 | HR 83 | Temp 97.8°F | Ht 61.0 in | Wt 86.2 lb

## 2012-07-09 DIAGNOSIS — J302 Other seasonal allergic rhinitis: Secondary | ICD-10-CM | POA: Insufficient documentation

## 2012-07-09 DIAGNOSIS — J309 Allergic rhinitis, unspecified: Secondary | ICD-10-CM

## 2012-07-09 MED ORDER — FLUTICASONE PROPIONATE 50 MCG/ACT NA SUSP: 2.0000 | Freq: Two times a day (BID) | NASAL | Status: DC

## 2012-07-09 NOTE — Progress Notes (Signed)
Patient: [name]   MRN: [id number]  DOB: February 05, 1951  PCP: Mathis Dad, MD   Subjective:    CC: Results   HPI: [name] is a 62 y.o. female with a PMHx as outlined below, who presented to clinic today for the following:  1) Seasonal Allergies / Allergic rhinitis - patient indicates a 2 month history of worsening symptoms. She Admits to symptoms of congestion, sneezing, sore throat, nasal blockage, post nasal drip and productive cough (unchanged from her baseline productive cough), itchy watery eyes, sneezing, hoarseness of throat. Denies swollen glands, fever and chills. The patient does not experience seasonal allergies historically, and at present, she is on antihistamines/ nasal steroids - has been on x 1 month. Has been helpful, but not completely resolving issue. She has not required increased usage of her albuterol or oxygen during this time. Is known to have bad allergies and has been to an allergist in the past (was told it is easier to distinguish what environmental allergies she does not have). Since 04/30/2012 when her symptoms began to worsen, she has been prescribed 2 courses of oral steroids as well as a 14 day course of Augmentin, a ten-day course of doxycycline, and a five-day course of azithromycin. She was started on Zyrtec and Flonase ~ 1 month ago with some improvement of sx.  During last visit on 4/16, had CT maxillosinus that was essentially wnl. As well, treated with an additional 5 day course of prednisone - was not helpful for sx.    Review of Systems: Per HPI.   Current Outpatient Medications: Medication Sig  . ADVAIR DISKUS 250-50 MCG/DOSE AEPB INHALE 1 PUFF INTO THE LUNGS 2 (TWO) TIMES DAILY.  Marland Kitchen albuterol (PROVENTIL HFA;VENTOLIN HFA) 108 (90 BASE) MCG/ACT inhaler Inhale 2 puffs into the lungs every 4 (four) hours as needed. For shortness of breath  . albuterol (PROVENTIL) (2.5 MG/3ML) 0.083% nebulizer solution Take 2.5 mg by nebulization every 6  (six) hours as needed. Shortness of breath  . alendronate (FOSAMAX) 35 MG tablet Take 35 mg by mouth every 7 (seven) days. Take in the morning with a full glass of water, on an empty stomach, and do not take anything else by mouth or lie down for the next 30 min.  . calcium carbonate (OS-CAL) 600 MG TABS Take 600 mg by mouth 2 (two) times daily.  . cetirizine (ZYRTEC) 10 MG tablet Take 1 tablet (10 mg total) by mouth daily.  . chlorzoxazone (PARAFON) 500 MG tablet Take 1 tablet (500 mg total) by mouth 3 (three) times daily as needed for muscle spasms.  . cholecalciferol (VITAMIN D) 1000 UNITS tablet Take 1,000 Units by mouth daily.  . clemastine (TAVIST) 2.68 MG TABS Take 2.68 mg by mouth 2 (two) times daily.  . DULoxetine (CYMBALTA) 30 MG capsule Take 30-60 mg by mouth daily. Takes 30 mg in the morning and 60 mg in the evenings  . fluticasone (FLONASE) 50 MCG/ACT nasal spray Place 1 spray into the nose 2 (two) times daily.  . Fluticasone-Salmeterol (ADVAIR) 250-50 MCG/DOSE AEPB Inhale 1 puff into the lungs 2 (two) times daily.  Marland Kitchen lamoTRIgine (LAMICTAL) 100 MG tablet Take 100-200 mg by mouth 2 (two) times daily. 1 tablet in the morning and 2 tablets at night  . OLANZapine (ZYPREXA) 5 MG tablet Take 5 mg by mouth at bedtime.  Marland Kitchen oxyCODONE-acetaminophen (PERCOCET) 10-325 MG per tablet Take 1.5 tablets by mouth every 6 (six) hours as needed for pain.  For pain  . predniSONE (DELTASONE) 20 MG tablet Take 2 tablets (40 mg total) by mouth daily.  Marland Kitchen PROAIR HFA 108 (90 BASE) MCG/ACT inhaler INHALE 1 PUFF IN MOUTH EVERY 4 HOURS AS NEEDED FOR WHEEZING  . PROAIR HFA 108 (90 BASE) MCG/ACT inhaler INHALE 1 PUFF IN MOUTH EVERY 4 HOURS AS NEEDED FOR WHEEZING  . tiotropium (SPIRIVA) 18 MCG inhalation capsule Place 18 mcg into inhaler and inhale daily.  Marland Kitchen zolpidem (AMBIEN) 5 MG tablet Take 1 tablet (5 mg total) by mouth at bedtime as needed for sleep.   No current facility-administered medications for this visit.     Allergies: Allergies  Allergen Reactions  . Banana Nausea And Vomiting    Past Medical History  Diagnosis Date  . Depression   . Hypertension   . Suicidal ideation 2007     attempted overdose 2012  Dr. Gwendalyn Ege report  . Bipolar disorder   . Substance abuse      narcotics, alcohol, tobacco  . COPD (chronic obstructive pulmonary disease)     not on home oxygen  . Chronic pain syndrome     follows at pain managment  . DEFICIENCY, VITAMIN D NOS 01/03/2007  . Shortness of breath     "all the time" (01/10/2012)  . Arthritis     "legs, back" (01/10/2012)  . Chronic lower back pain   . Anxiety      Objective:    Physical Exam: Filed Vitals:   07/09/12 0916  BP: 128/64  Pulse: 83  Temp: 97.8 F (36.6 C)     General: Vital signs reviewed and noted. Thin, chronically ill appearing woman, in no acute distress; alert, appropriate and cooperative throughout examination.  Head: Normocephalic, atraumatic.  Eyes: conjunctivae/corneas clear. PERRL.  Ears: TM nonerythematous, not bulging, good light reflex bilaterally.  Nose: Mucous membranes moist, mildly inflammed, nonerythematous.  Throat: Oropharynx nonerythematous, no exudate appreciated.   Neck: No deformities, masses, or tenderness noted.  Lungs:  Normal respiratory effort. Decreased breath sounds diffusely. Otherwise, no overt crackles or wheezes.  Heart: RRR. S1 and S2 normal without gallop, rubs. No murmur.  Abdomen:  BS normoactive. Soft, Nondistended, non-tender.  No masses or organomegaly.  Extremities: No pretibial edema.    Assessment/ Plan:   The patient's case and plan of care was discussed with attending physician, Dr. Doneen Poisson.

## 2012-07-09 NOTE — Assessment & Plan Note (Signed)
Pertinent Data Reviewed: Maxillofacial CT (06/29/2012) - IMPRESSION: No evidence of sinonasal polyposis. Stable and essentially negative paranasal sinuses. Original Report Authenticated By: Erskine Speed, M.D.  Assessment: Patient describes symptoms very consistent with seasonal allergies atop baseline significant environmental allergies (as per allergy testing several years ago). She has already had multiple courses of Abx and steroid therapy without resolution. I am more inclined to treat towards her allergies first, which are likely exacerbated in this unpredictable season. She has noted some improvement with starting Zyrtec and Flonase a month ago.  Plan:      Continue Zyrtec.  Increase flonase to 2 inhalations BID.  PRN nasal washings with distilled water if needed.  Avoid allergens as able - staying indoors more and wearing mask as able outside.  If persistent symptoms, may require allergy/ immunology referral for possible allergy shots. The patient was informed of the red flag symptoms that should prompt her immediate return for reevaluation - such as, but not limited to the following: Fevers, chills, worsening headaches, difficulty breathing, shortness of breath. She verbally expresses understanding of the information provided.

## 2012-07-09 NOTE — Patient Instructions (Addendum)
General Instructions:  Please follow-up at the clinic in 1 month, at which time we will reevaluate your nasal congestion and chronic issues - OR, please follow-up in the clinic sooner if needed.  There have been changes in your medications:  INCREASE your flonase to 2 sprays twice a day    If you develop fevers, chills, worsening headaches or congestion - come in sooner for reevaluation.  If symptoms worsen, or new symptoms arise, please call the clinic or go to the ER.  PLEASE BRING ALL OF YOUR MEDICATIONS  IN A BAG TO YOUR NEXT APPOINTMENT   Treatment Goals:  Goals (1 Years of Data) as of 07/09/12   None      Self Care Goals & Plans:  Self Care Goal 06/27/2012  Manage my medications take my medicines as prescribed; bring my medications to every visit; refill my medications on time; follow the sick day instructions if I am sick  Monitor my health keep track of my weight  Eat healthy foods eat more vegetables; eat fruit for snacks and desserts; eat foods that are low in salt; eat baked foods instead of fried foods; eat smaller portions  Be physically active take a walk every day; find an activity I enjoy     Allergic Rhinitis Allergic rhinitis is when the mucous membranes in the nose respond to allergens. Allergens are particles in the air that cause your body to have an allergic reaction. This causes you to release allergic antibodies. Through a chain of events, these eventually cause you to release histamine into the blood stream (hence the use of antihistamines). Although meant to be protective to the body, it is this release that causes your discomfort, such as frequent sneezing, congestion and an itchy runny nose.  CAUSES  The pollen allergens may come from grasses, trees, and weeds. This is seasonal allergic rhinitis, or "hay fever." Other allergens cause year-round allergic rhinitis (perennial allergic rhinitis) such as house dust mite allergen, pet dander and mold spores.   SYMPTOMS   Nasal stuffiness (congestion).  Runny, itchy nose with sneezing and tearing of the eyes.  There is often an itching of the mouth, eyes and ears. It cannot be cured, but it can be controlled with medications. DIAGNOSIS  If you are unable to determine the offending allergen, skin or blood testing may find it. TREATMENT   Avoid the allergen.  Medications and allergy shots (immunotherapy) can help.  Hay fever may often be treated with antihistamines in pill or nasal spray forms. Antihistamines block the effects of histamine. There are over-the-counter medicines that may help with nasal congestion and swelling around the eyes. Check with your caregiver before taking or giving this medicine. If the treatment above does not work, there are many new medications your caregiver can prescribe. Stronger medications may be used if initial measures are ineffective. Desensitizing injections can be used if medications and avoidance fails. Desensitization is when a patient is given ongoing shots until the body becomes less sensitive to the allergen. Make sure you follow up with your caregiver if problems continue. SEEK MEDICAL CARE IF:   You develop fever (more than 100.5 F (38.1 C).  You develop a cough that does not stop easily (persistent).  You have shortness of breath.  You start wheezing.  Symptoms interfere with normal daily activities. Document Released: 11/23/2000 Document Revised: 05/23/2011 Document Reviewed: 06/04/2008 [facility] Patient Information 2013 [place], [place].

## 2012-07-10 ENCOUNTER — Other Ambulatory Visit: Payer: Self-pay | Admitting: *Deleted

## 2012-07-10 NOTE — Progress Notes (Signed)
Case discussed with Dr. Kalia-Reynolds at the time of the visit.  We reviewed the resident's history and exam and pertinent patient test results.  I agree with the assessment, diagnosis and plan of care documented in the resident's note. 

## 2012-07-11 MED ORDER — ALBUTEROL SULFATE HFA 108 (90 BASE) MCG/ACT IN AERS: INHALATION_SPRAY | RESPIRATORY_TRACT | Status: DC

## 2012-08-08 ENCOUNTER — Ambulatory Visit (INDEPENDENT_AMBULATORY_CARE_PROVIDER_SITE_OTHER): Payer: Medicare Other | Admitting: Internal Medicine

## 2012-08-08 ENCOUNTER — Encounter: Payer: Self-pay | Admitting: Internal Medicine

## 2012-08-08 VITALS — BP 117/66 | HR 87 | Temp 98.8°F | Ht 61.0 in | Wt 86.9 lb

## 2012-08-08 DIAGNOSIS — J309 Allergic rhinitis, unspecified: Secondary | ICD-10-CM

## 2012-08-08 DIAGNOSIS — J302 Other seasonal allergic rhinitis: Secondary | ICD-10-CM

## 2012-08-08 DIAGNOSIS — Z1239 Encounter for other screening for malignant neoplasm of breast: Secondary | ICD-10-CM

## 2012-08-08 DIAGNOSIS — R634 Abnormal weight loss: Secondary | ICD-10-CM

## 2012-08-08 DIAGNOSIS — J449 Chronic obstructive pulmonary disease, unspecified: Secondary | ICD-10-CM

## 2012-08-08 MED ORDER — LEVOCETIRIZINE DIHYDROCHLORIDE 5 MG PO TABS
5.0000 mg | ORAL_TABLET | Freq: Every evening | ORAL | Status: DC
Start: 2012-08-08 — End: 2012-08-21

## 2012-08-08 MED ORDER — BECLOMETHASONE DIPROP MONOHYD 42 MCG/SPRAY NA SUSP: 2.0000 | Freq: Two times a day (BID) | NASAL | Status: DC

## 2012-08-08 MED ORDER — ENSURE HIGH PROTEIN PO LIQD: 1.0000 | Freq: Three times a day (TID) | ORAL | Status: DC

## 2012-08-08 NOTE — Assessment & Plan Note (Signed)
I will change patient to Levocetrizine and beclomethasone nasal spray. Recommended to continue nasal washing when necessary. Reevaluate patient in 3-4 weeks.

## 2012-08-08 NOTE — Progress Notes (Signed)
Subjective:   Patient ID: [name] female   DOB: 12-Dec-1950 62 y.o.   MRN: [id number]  HPI: [name] is a 62 y.o. female with past history significant as outlined below who presented to with congestion, coughing, nasal drainage. Patient was evaluated with similar symptoms on 07/09/12. She was instructed to continue Zyrtec and increase Flonase to 2 sprays twice a day as well as  nasal washing prn.  Patient noted that this  did not improve her symptoms at all. She continues to have congestion and nasal drainage along with that coughing. She has the coughing spells the cause of her wheezing and she has to use her albuterol inhaler. She noted she normally takes her Advair, Spiriva and albuterol nebulizer 3 times a day. Patient was further complaining about that her legs would just give out on her. Denies any pain in her joints. Denies any headache, vision changes, numbness or tingling.  tsh normal 10/ 2013  Past Medical History  Diagnosis Date  . Depression   . Hypertension   . Suicidal ideation 2007     attempted overdose 2012  Dr. Gwendalyn Ege report  . Bipolar disorder   . Substance abuse      narcotics, alcohol, tobacco  . COPD (chronic obstructive pulmonary disease)     not on home oxygen  . Chronic pain syndrome     follows at pain managment  . DEFICIENCY, VITAMIN D NOS 01/03/2007  . Shortness of breath     "all the time" (01/10/2012)  . Arthritis     "legs, back" (01/10/2012)  . Chronic lower back pain   . Anxiety    Current Outpatient Prescriptions  Medication Sig Dispense Refill  . albuterol (PROAIR HFA) 108 (90 BASE) MCG/ACT inhaler INHALE 1 PUFF IN MOUTH EVERY 4 HOURS AS NEEDED FOR WHEEZING  8.5 each  2  . albuterol (PROVENTIL) (2.5 MG/3ML) 0.083% nebulizer solution Take 2.5 mg by nebulization every 6 (six) hours as needed. Shortness of breath      . alendronate (FOSAMAX) 35 MG tablet Take 35 mg by mouth every 7 (seven) days. Take in the morning with a full glass  of water, on an empty stomach, and do not take anything else by mouth or lie down for the next 30 min.      . calcium carbonate (OS-CAL) 600 MG TABS Take 600 mg by mouth 2 (two) times daily.      . cetirizine (ZYRTEC) 10 MG tablet Take 1 tablet (10 mg total) by mouth daily.  30 tablet  4  . chlorzoxazone (PARAFON) 500 MG tablet Take 1 tablet (500 mg total) by mouth 3 (three) times daily as needed for muscle spasms.  30 tablet  0  . cholecalciferol (VITAMIN D) 1000 UNITS tablet Take 1,000 Units by mouth daily.      . clemastine (TAVIST) 2.68 MG TABS Take 2.68 mg by mouth 2 (two) times daily.      . DULoxetine (CYMBALTA) 30 MG capsule Take 30-60 mg by mouth daily. Takes 30 mg in the morning and 60 mg in the evenings      . fluticasone (FLONASE) 50 MCG/ACT nasal spray Place 2 sprays into the nose 2 (two) times daily.  16 g  1  . Fluticasone-Salmeterol (ADVAIR) 250-50 MCG/DOSE AEPB Inhale 1 puff into the lungs 2 (two) times daily.  60 each  11  . lamoTRIgine (LAMICTAL) 100 MG tablet Take 100-200 mg by mouth 2 (two) times daily. 1 tablet in the  morning and 2 tablets at night      . OLANZapine (ZYPREXA) 5 MG tablet Take 5 mg by mouth at bedtime.      Marland Kitchen oxyCODONE-acetaminophen (PERCOCET) 10-325 MG per tablet Take 1.5 tablets by mouth every 6 (six) hours as needed for pain. For pain  120 tablet  0  . PROAIR HFA 108 (90 BASE) MCG/ACT inhaler INHALE 1 PUFF IN MOUTH EVERY 4 HOURS AS NEEDED FOR WHEEZING  8.5 each  2  . tiotropium (SPIRIVA) 18 MCG inhalation capsule Place 18 mcg into inhaler and inhale daily.      Marland Kitchen zolpidem (AMBIEN) 5 MG tablet Take 1 tablet (5 mg total) by mouth at bedtime as needed for sleep.  30 tablet  0   No current facility-administered medications for this visit.   Family History  Problem Relation Age of Onset  . Stroke Neg Hx   . Cancer Neg Hx    History   Social History  . Marital Status: Single    Spouse Name: N/A    Number of Children: N/A  . Years of Education: N/A    Social History Main Topics  . Smoking status: Current Every Day Smoker -- 0.12 packs/day for 42 years    Types: Cigarettes  . Smokeless tobacco: Never Used     Comment: trying to quit completely - 2 cigs/day.  . Alcohol Use: No  . Drug Use: No  . Sexually Active: None   Other Topics Concern  . None   Social History Narrative  . None   Review of Systems: Constitutional: Denies fever, chills, diaphoresis, appetite change and fatigue.  HEENT: Noted itchy eyes, congestion, sore throat, rhinorrhea but denies mouth sores, trouble swallowing, neck pain, neck stiffness and tinnitus.   Respiratory: Noted occasionally SOB, DOE, cough, chest tightness and wheezing.   Cardiovascular: Denies chest pain, palpitations and leg swelling.  Gastrointestinal: Denies nausea, vomiting, abdominal pain, diarrhea, constipation, blood in stool and abdominal distention.  Endocrine: Denies: hot or cold intolerance, sweats, changes in hair or nails, polyuria, polydipsia. Musculoskeletal: Noted myalgias Skin: Denies pallor, rash and wound.  Neurological: Denies dizziness,  light-headedness, numbness and headaches.    Objective:  Physical Exam: Filed Vitals:   08/08/12 0923  BP: 117/66  Pulse: 87  Temp: 98.8 F (37.1 C)  TempSrc: Oral  Height: 5\' 1"  (1.549 m)  Weight: 86 lb 14.4 oz (39.418 kg)  SpO2: 98%   Constitutional: Vital signs reviewed.  Patient is a well-developed and well-nourished female  in no acute distress and cooperative with exam. Alert and oriented x3.  Head: Normocephalic and atraumatic Ear: TM normal bilaterally Nose: Boggy nasal mucosa, erythematosus .  Mouth: no erythema or exudates, MMM Eyes: PERRL, EOMI, conjunctivae normal, No scleral icterus.  Neck: Supple, .  Cardiovascular: RRR, S1 normal, S2 normal, no MRG, pulses symmetric and intact bilaterally Pulmonary/Chest: normal respiratory effort, CTAB, no wheezes, rales, or rhonchi Abdominal: Soft. Non-tender, non-distended,  bowel sounds are normal, no masses, organomegaly, or guarding present.  Musculoskeletal: No joint deformities, erythema, or stiffness, ROM full and no nontender Hematology: no cervical adenopathy.  Neurological: A&O x3, Strength is normal and symmetric bilaterally, cranial nerve II-XII are grossly intact, no focal motor deficit, sensory intact to light touch bilaterally.  Skin: Warm, dry and intact. No rash, cyanosis, or clubbing.

## 2012-08-08 NOTE — Assessment & Plan Note (Addendum)
Patient is currently using Advair, Spiriva and albuterol every 6 hours. Associates in home oxygen.  No PFTs on file. We will obtain PFT. Furthermore patient should be referred to pulmonary rehabilitation. Awaiting PFT results for these. Instructed patient that she needs to take Advair and Spiriva on a daily basis albuterol only as needed. If she 6 been worsening symptoms she needs to be evaluated in the clinic. Consider referral patient to nutritionist .

## 2012-08-08 NOTE — Assessment & Plan Note (Addendum)
TSH within normal limits 12/2012. Most likely due to advanced COPD.  Patient noted that she is eating on a regular basis but her appetite is not great.  Screening are not up to date at this point. We will refer patient for mammogram. 2 next office visit patient needs to be referred for colonoscopy and  Pap smear should be obtained.  Discussed with patient that she needs to increase her calorie intake. Prescribed  Ensure 3 times a day. Consider referral to initiate nutritionist.

## 2012-08-08 NOTE — Patient Instructions (Addendum)
1. Stop taking Zyrtec and Flonase nasal spray 2. Start using levocetrizine 5 daily at bedtime and beclomethasone nasal spray 3. Increase your protein intake 4. We will refer you to the pulmonary rehabilitation 5. If you're experiencing shortness of breath, cough, wheezing for more than 3 days please call the clinic for further instruction

## 2012-08-10 NOTE — Progress Notes (Signed)
Case discussed with Dr.Illath soon after the resident saw the patient.  We reviewed the resident's history and exam and pertinent patient test results.  I agree with the assessment, diagnosis and plan of care documented in the resident's note. 

## 2012-08-14 ENCOUNTER — Ambulatory Visit (HOSPITAL_COMMUNITY)
Admission: RE | Admit: 2012-08-14 | Discharge: 2012-08-14 | Disposition: A | Discharge status: Home / Self Care | Payer: Medicare Other | Source: Ambulatory Visit | Attending: Internal Medicine | Admitting: Internal Medicine

## 2012-08-14 DIAGNOSIS — J4489 Other specified chronic obstructive pulmonary disease: Secondary | ICD-10-CM | POA: Insufficient documentation

## 2012-08-14 DIAGNOSIS — J449 Chronic obstructive pulmonary disease, unspecified: Secondary | ICD-10-CM

## 2012-08-14 MED ORDER — ALBUTEROL SULFATE (5 MG/ML) 0.5% IN NEBU
2.5000 mg | INHALATION_SOLUTION | Freq: Once | RESPIRATORY_TRACT | Status: AC
  Administered 2012-08-14: 2.5 mg via RESPIRATORY_TRACT

## 2012-08-21 ENCOUNTER — Encounter: Payer: Self-pay | Admitting: Internal Medicine

## 2012-08-21 ENCOUNTER — Ambulatory Visit (HOSPITAL_BASED_OUTPATIENT_CLINIC_OR_DEPARTMENT_OTHER): Payer: Medicare Other | Admitting: Internal Medicine

## 2012-08-21 VITALS — BP 109/60 | HR 78 | Temp 98.7°F | Ht 61.0 in | Wt 90.2 lb

## 2012-08-21 DIAGNOSIS — J302 Other seasonal allergic rhinitis: Secondary | ICD-10-CM

## 2012-08-21 DIAGNOSIS — J449 Chronic obstructive pulmonary disease, unspecified: Secondary | ICD-10-CM

## 2012-08-21 DIAGNOSIS — J309 Allergic rhinitis, unspecified: Secondary | ICD-10-CM

## 2012-08-21 DIAGNOSIS — Z8659 Personal history of other mental and behavioral disorders: Secondary | ICD-10-CM

## 2012-08-21 MED ORDER — ALBUTEROL SULFATE HFA 108 (90 BASE) MCG/ACT IN AERS: INHALATION_SPRAY | RESPIRATORY_TRACT | Status: DC

## 2012-08-21 MED ORDER — FEXOFENADINE-PSEUDOEPHED ER 60-120 MG PO TB12: 1.0000 | ORAL_TABLET | Freq: Two times a day (BID) | ORAL | Status: DC

## 2012-08-21 NOTE — Assessment & Plan Note (Signed)
Patient continues to have rhinorrhea/nasal congestion despite using Zyrtec or Xyzal, Clemastine, or Beclomethasone.  CT was unremarkable in 06/2012, negative for sinusitis. I put her on chlorpheniramine but that did not help her symptoms. She has not been able to see ENT due to her insurance.  I think she may benefit from seeing an Allergist in the future.   -Will stop Clemastine -Start Allegra-pseudoephrine D (she has not tried Claritin or Allegra) -Continue Beclomethasone -Consider Kenalog injection if no improvement

## 2012-08-21 NOTE — Assessment & Plan Note (Signed)
Physical exam only showed very mild scattered wheezes with decreased breath sounds. I do not think she has a COPD exacerbation, O2 sat was 97% on 2 L nasal cannula. Patient continues to smoke 2 cigarettes per day down from 2 packs per day. I encouraged patient to stop smoking in addition to refer patient for pulmonary rehabilitation. -PFT result pending. -Will refer patient to pulmonology/pulmonary rehabilitation -Continue Advair, Spiriva, albuterol inhaler, albuterol nebulizers

## 2012-08-21 NOTE — Addendum Note (Signed)
Addended by: Carrolyn Meiers T on: 08/21/2012 07:22 PM   Modules accepted: Orders, Medications, Level of Service

## 2012-08-21 NOTE — Patient Instructions (Addendum)
Will refer to pulmonologist for pulmonary rehabilitation Start Allegra twice daily Will schedule for papsmear next visit Follow up in 3 months

## 2012-08-21 NOTE — Progress Notes (Signed)
Patient ID: [name], female   DOB: Dec 19, 1950, 62 y.o.   MRN: [id number] [name] is a 62 year old woman with past medical history of bipolar disorder, COPD presents today for followup. She states that she has been Wheezing lately.  She uses albuterol inhaler 3-4 times per day.  She feels more SOB but she can walk up to 1/2 mile before feeling SOB. She is currently using 2L [place] at home continuously. O2 sat was 97% on 2 L in the office today. She denied any increase in sputum production, fever or chills.  She continues to have nasal congestion and states that the Zyrtec and Xyzal did not work for her. She has not tried Claritin nor Allegra. CT of her sinuses in April 2004 to was unremarkable. Weight gain since she started drinking Ensure. She is scheduled for mammogram on 08/29/2012. She would like to defer her Pap smear today and schedule for next office visit. She states that she had a PFT done last week    ROS: as per HPI  PE: General: alert, thin-appearing, and cooperative to examination.  Lungs: normal respiratory effort, no accessory muscle use, decrease breath sounds diffusely, no crackles, and very mild scattered wheezes.on 2 L nasal cannula  Heart: normal rate, regular rhythm, no murmur, no gallop, and no rub.  Abdomen: soft, non-tender, normal bowel sounds, no distention, no guarding, no rebound tenderness Neurologic: nonfocal Skin: turgor normal and no rashes.  Psych: appropriate

## 2012-08-21 NOTE — Assessment & Plan Note (Signed)
Stable. She denies any suicide ideation, hallucinations. Patient is being followed by mental health.

## 2012-08-22 NOTE — Progress Notes (Signed)
Case discussed with Dr. Ho at the time of the visit.  We reviewed the resident's history and exam and pertinent patient test results.  I agree with the assessment, diagnosis and plan of care documented in the resident's note. 

## 2012-08-29 ENCOUNTER — Ambulatory Visit (HOSPITAL_COMMUNITY)
Admission: RE | Admit: 2012-08-29 | Discharge: 2012-08-29 | Disposition: A | Discharge status: Home / Self Care | Payer: Medicare Other | Source: Ambulatory Visit | Attending: Internal Medicine | Admitting: Internal Medicine

## 2012-08-29 DIAGNOSIS — Z1231 Encounter for screening mammogram for malignant neoplasm of breast: Secondary | ICD-10-CM | POA: Insufficient documentation

## 2012-08-29 DIAGNOSIS — Z1239 Encounter for other screening for malignant neoplasm of breast: Secondary | ICD-10-CM

## 2012-09-03 ENCOUNTER — Encounter: Payer: Self-pay | Admitting: Internal Medicine

## 2012-09-19 ENCOUNTER — Institutional Professional Consult (permissible substitution): Payer: Medicare Other | Admitting: Pulmonary Disease

## 2012-09-19 NOTE — Addendum Note (Signed)
Addended by: Neomia Dear on: 09/19/2012 06:42 PM   Modules accepted: Orders

## 2012-09-24 ENCOUNTER — Encounter: Payer: Self-pay | Admitting: Pulmonary Disease

## 2012-10-19 ENCOUNTER — Telehealth: Payer: Self-pay | Admitting: *Deleted

## 2012-10-19 NOTE — Telephone Encounter (Signed)
Pt called problems with nausea only when she takes the Ensure. Ding Ensure 3 times per day.  Wt now at home 87lb. Pain has inc for past 6 months. Appt made 10/22/12 1:45PM Dr Zada Girt. Stanton Kidney Jodeci Roarty RN 10/19/12 10:45AM

## 2012-10-22 ENCOUNTER — Ambulatory Visit: Payer: Medicare Other | Admitting: Internal Medicine

## 2012-10-25 ENCOUNTER — Ambulatory Visit (INDEPENDENT_AMBULATORY_CARE_PROVIDER_SITE_OTHER): Payer: Medicare Other | Admitting: Internal Medicine

## 2012-10-25 ENCOUNTER — Encounter: Payer: Self-pay | Admitting: Internal Medicine

## 2012-10-25 VITALS — BP 124/54 | HR 83 | Temp 97.2°F | Ht 60.25 in | Wt 83.3 lb

## 2012-10-25 DIAGNOSIS — R634 Abnormal weight loss: Secondary | ICD-10-CM

## 2012-10-25 DIAGNOSIS — R112 Nausea with vomiting, unspecified: Secondary | ICD-10-CM

## 2012-10-25 DIAGNOSIS — J45909 Unspecified asthma, uncomplicated: Secondary | ICD-10-CM

## 2012-10-25 MED ORDER — CETIRIZINE HCL 5 MG PO TABS: 5.0000 mg | ORAL_TABLET | Freq: Every evening | ORAL | Status: DC | PRN

## 2012-10-25 MED ORDER — ONDANSETRON HCL 4 MG PO TABS: 4.0000 mg | ORAL_TABLET | Freq: Three times a day (TID) | ORAL | Status: DC | PRN

## 2012-10-25 NOTE — Patient Instructions (Signed)
General Instructions: Will try Zofran for nausea and vomiting. We will also try Zyrtec for your postnasal drainage  Please take your ensure  Please call clinic if symptoms do not improve.  Treatment Goals:  Goals (1 Years of Data) as of 10/25/12   None      Progress Toward Treatment Goals:    Self Care Goals & Plans:  Self Care Goal 10/25/2012  Manage my medications take my medicines as prescribed; bring my medications to every visit; refill my medications on time; follow the sick day instructions if I am sick  Monitor my health keep track of my weight  Eat healthy foods eat more vegetables; eat fruit for snacks and desserts; eat baked foods instead of fried foods; eat smaller portions  Be physically active take a walk every day; find an activity I enjoy  Stop smoking -

## 2012-10-26 NOTE — Assessment & Plan Note (Signed)
She has lost 6.7LB over the past 2 months. She weighs 83LB today. Her lowest weight in the last year was 80lb. Wt loss is most likely 2/2 severe COPD. I have encouraged her to take her ensure as before.

## 2012-10-26 NOTE — Assessment & Plan Note (Signed)
The etiology of her nausea and vomiting is unclear. Without fevers, chills, rigors, or change of appetite, fatigue, an infectious cause was unlikely. I considered her medications, but these too are unlikely given that she has not recently added any new medications or changed dosage of any her usual medications. She also denies unusual food. Her continuous postnasal drip is probably likely contributing. I do not suspect electrolyte imbalance.  Plan. - I discussed with her that the cause of her N/V remained unclear to me. I offered to try Zofran as a symptomatic management strategy. - If the symptoms persist, we can consider doing further evaluation with CMP, urinalysis, CBC, stool exam etc.  - I encouraged her to resume her Ensure - she will f/u if symptoms persist.

## 2012-10-26 NOTE — Assessment & Plan Note (Signed)
She has tried multiple antihistamines in the past with varying results - currently on Allegra.  She reports, that over-the-counter stuff like Claritin and Mucinex do not help at all. She is willing to try anything that can help with her postnasal drip. She already on steroid nasal spray which I encouraged her to continue.  I recommended a trial of Zyrtec at a low of 5 mg at bedtime. I warned her about the sedating effect of this medication and to stop it if she becoming too sleepy or dizzy.

## 2012-10-26 NOTE — Progress Notes (Signed)
Patient ID: [name], female   DOB: [birth year], 62 y.o.   MRN: [id number]   Subjective:   HPI: [name] is a 62 y.o. and medical history of depression, hypertension, COPD, oxygen dependent, bipolar disorder, presents to the clinic with complaints of nausea and vomiting for one week and also postnasal drip, which has worsened recently.  Nausea and vomiting. Symptoms have been present for one week. She reports feeling very nauseous and had 2 episodes of emesis today after breakfast. There was no blood. She denies fevers, chills, or increased fatigue. She has tried to continue by mouth intake. No history of abdominal pain diarrhea, bloody stools, or pain on passing stool. No urinary symptoms. No new medications  recently. She initially thought that her nausea was caused by Ensure , which she's been taking for a couple of months. She discontinued this 2 days ago but the symptoms have persisted. This is the first time she's getting such symptoms. Has not tried anything to help Her diet has not changed recently. She is unsure whether she ate something bad.   Postnasal drip. Patient has chronic allergic rhinitis, and she has chronic postnasal drip and runny nose. However, she feels that her symptoms are worsened recently. No exposure to suspicious allergens. Her usual fexofenadine has not helped. She denies cough, shortness of breath, worsening of wheezing, ear pain, sore throat, nasal stuffiness, headaches, photophobia, swollen lymph nodes or recent contact with someone with similar symptoms. She has never been evaluated by ENT.   Past Medical History  Diagnosis Date  . Depression   . Hypertension   . Suicidal ideation 2007     attempted overdose 2012  Dr. Gwendalyn Ege report  . Bipolar disorder   . Substance abuse      narcotics, alcohol, tobacco  . COPD (chronic obstructive pulmonary disease)     not on home oxygen  . Chronic pain syndrome     follows at pain managment  .  DEFICIENCY, VITAMIN D NOS 01/03/2007  . Shortness of breath     "all the time" (01/10/2012)  . Arthritis     "legs, back" (01/10/2012)  . Chronic lower back pain   . Anxiety    Current Outpatient Prescriptions  Medication Sig Dispense Refill  . albuterol (PROAIR HFA) 108 (90 BASE) MCG/ACT inhaler INHALE 1 PUFF IN MOUTH EVERY2- 4 HOURS AS NEEDED FOR WHEEZING  18 each  2  . albuterol (PROVENTIL) (2.5 MG/3ML) 0.083% nebulizer solution Take 2.5 mg by nebulization every 6 (six) hours as needed. Shortness of breath      . alendronate (FOSAMAX) 35 MG tablet Take 35 mg by mouth every 7 (seven) days. Take in the morning with a full glass of water, on an empty stomach, and do not take anything else by mouth or lie down for the next 30 min.      . beclomethasone (BECONASE-AQ) 42 MCG/SPRAY nasal spray Place 2 sprays into the nose 2 (two) times daily. Dose is for each nostril.  25 g  0  . calcium carbonate (OS-CAL) 600 MG TABS Take 600 mg by mouth 2 (two) times daily.      . chlorzoxazone (PARAFON) 500 MG tablet Take 1 tablet (500 mg total) by mouth 3 (three) times daily as needed for muscle spasms.  30 tablet  0  . cholecalciferol (VITAMIN D) 1000 UNITS tablet Take 1,000 Units by mouth daily.      . DULoxetine (CYMBALTA) 30 MG capsule Take 30-60 mg by mouth  daily. Takes 30 mg in the morning and 60 mg in the evenings      . fexofenadine-pseudoephedrine (ALLEGRA-D) 60-120 MG per tablet Take 1 tablet by mouth 2 (two) times daily.  60 tablet  2  . Fluticasone-Salmeterol (ADVAIR) 250-50 MCG/DOSE AEPB Inhale 1 puff into the lungs 2 (two) times daily.  60 each  11  . lamoTRIgine (LAMICTAL) 100 MG tablet Take 100-200 mg by mouth 2 (two) times daily. 1 tablet in the morning and 2 tablets at night      . Nutritional Supplements (ENSURE HIGH PROTEIN) LIQD Take 1 Can by mouth 3 (three) times daily.  90 Can  2  . OLANZapine (ZYPREXA) 5 MG tablet Take 5 mg by mouth at bedtime.      Marland Kitchen oxyCODONE-acetaminophen (PERCOCET)  10-325 MG per tablet Take 1.5 tablets by mouth every 6 (six) hours as needed for pain. For pain  120 tablet  0  . tiotropium (SPIRIVA) 18 MCG inhalation capsule Place 18 mcg into inhaler and inhale daily.      Marland Kitchen zolpidem (AMBIEN) 5 MG tablet Take 1 tablet (5 mg total) by mouth at bedtime as needed for sleep.  30 tablet  0  . cetirizine (ZYRTEC) 5 MG tablet Take 1 tablet (5 mg total) by mouth at bedtime as needed for rhinitis.  30 tablet  0  . ondansetron (ZOFRAN) 4 MG tablet Take 1 tablet (4 mg total) by mouth every 8 (eight) hours as needed for nausea.  20 tablet  0   No current facility-administered medications for this visit.   Family History  Problem Relation Age of Onset  . Stroke Neg Hx   . Cancer Neg Hx    History   Social History  . Marital Status: Single    Spouse Name: N/A    Number of Children: N/A  . Years of Education: N/A   Social History Main Topics  . Smoking status: Current Every Day Smoker -- 0.12 packs/day for 42 years    Types: Cigarettes  . Smokeless tobacco: Never Used     Comment: trying to quit completely - 2 cigs/day.  . Alcohol Use: No  . Drug Use: No  . Sexual Activity: None   Other Topics Concern  . None   Social History Narrative  . None   Review of Systems: Cardiovascular: No chest pain, palpitations and leg swelling.  Genitourinary: No dysuria, frequency, hematuria, or flank pain.  Musculoskeletal: No myalgias, back pain, joint swelling, arthralgias    Objective:  Physical Exam: Filed Vitals:   10/25/12 1408  BP: 124/54  Pulse: 83  Temp: 97.2 F (36.2 C)  TempSrc: Oral  Height: 5' 0.25" (1.53 m)  Weight: 83 lb 4.8 oz (37.785 kg)  SpO2: 95%   General: Pleasant, little woman. She appears older than her stated age. No acute distress. On 2 L/min Lungs: CTA bilaterally. HEENT: normal finding - not oropharyngeal erythema. Tympanic membranes appear normal bilaterally. No inflammation of nasal turbinates. No palpable lymph nodes in the  neck. Heart: RRR; no extra sounds or murmurs  Abdomen: Non-distended, normal BS, soft, nontender; no hepatosplenomegaly  Extremities: Evidence of muscle waisting. No pedal edema. No joint swelling or tenderness. Neurologic: Alert and oriented x3. No obvious neurologic deficits.  Assessment & Plan:  I have discussed my assessment and plan  with Dr. Criselda Peaches as detailed under problem based charting.

## 2012-10-27 NOTE — Progress Notes (Signed)
Case discussed with Dr. Kazibwe soon after the resident saw the patient.  We reviewed the resident's history and exam and pertinent patient test results.  I agree with the assessment, diagnosis, and plan of care documented in the resident's note. 

## 2012-11-07 ENCOUNTER — Ambulatory Visit (INDEPENDENT_AMBULATORY_CARE_PROVIDER_SITE_OTHER): Payer: Medicare Other | Admitting: Internal Medicine

## 2012-11-07 ENCOUNTER — Encounter: Payer: Self-pay | Admitting: Internal Medicine

## 2012-11-07 ENCOUNTER — Encounter: Payer: Self-pay | Admitting: Gastroenterology

## 2012-11-07 VITALS — BP 114/61 | HR 87 | Temp 98.9°F | Ht 61.0 in | Wt 84.8 lb

## 2012-11-07 DIAGNOSIS — R112 Nausea with vomiting, unspecified: Secondary | ICD-10-CM

## 2012-11-07 MED ORDER — OMEPRAZOLE 20 MG PO CPDR: 20.0000 mg | DELAYED_RELEASE_CAPSULE | Freq: Every day | ORAL | Status: DC

## 2012-11-07 NOTE — Progress Notes (Signed)
Patient ID: [name], female   DOB: 11-19-1950, 62 y.o.   MRN: [id number]    Subjective:   Patient ID: [name] female   DOB: 06-19-1950 62 y.o.   MRN: [id number]  HPI: [name] is a 62 y.o. CF here for an acute visit for weight loss.  She has a PMH listed below.  Please see assessment and plan for further details.     Past Medical History  Diagnosis Date  . Depression   . Hypertension   . Suicidal ideation 2007     attempted overdose 2012  Dr. Gwendalyn Ege report  . Bipolar disorder   . Substance abuse      narcotics, alcohol, tobacco  . COPD (chronic obstructive pulmonary disease)     not on home oxygen  . Chronic pain syndrome     follows at pain managment  . DEFICIENCY, VITAMIN D NOS 01/03/2007  . Shortness of breath     "all the time" (01/10/2012)  . Arthritis     "legs, back" (01/10/2012)  . Chronic lower back pain   . Anxiety    Current Outpatient Prescriptions  Medication Sig Dispense Refill  . albuterol (PROAIR HFA) 108 (90 BASE) MCG/ACT inhaler INHALE 1 PUFF IN MOUTH EVERY2- 4 HOURS AS NEEDED FOR WHEEZING  18 each  2  . albuterol (PROVENTIL) (2.5 MG/3ML) 0.083% nebulizer solution Take 2.5 mg by nebulization every 6 (six) hours as needed. Shortness of breath      . alendronate (FOSAMAX) 35 MG tablet Take 35 mg by mouth every 7 (seven) days. Take in the morning with a full glass of water, on an empty stomach, and do not take anything else by mouth or lie down for the next 30 min.      . beclomethasone (BECONASE-AQ) 42 MCG/SPRAY nasal spray Place 2 sprays into the nose 2 (two) times daily. Dose is for each nostril.  25 g  0  . calcium carbonate (OS-CAL) 600 MG TABS Take 600 mg by mouth 2 (two) times daily.      . cetirizine (ZYRTEC) 5 MG tablet Take 1 tablet (5 mg total) by mouth at bedtime as needed for rhinitis.  30 tablet  0  . chlorzoxazone (PARAFON) 500 MG tablet Take 1 tablet (500 mg total) by mouth 3 (three) times daily as needed for muscle  spasms.  30 tablet  0  . cholecalciferol (VITAMIN D) 1000 UNITS tablet Take 1,000 Units by mouth daily.      . DULoxetine (CYMBALTA) 30 MG capsule Take 30-60 mg by mouth daily. Takes 30 mg in the morning and 60 mg in the evenings      . fexofenadine-pseudoephedrine (ALLEGRA-D) 60-120 MG per tablet Take 1 tablet by mouth 2 (two) times daily.  60 tablet  2  . Fluticasone-Salmeterol (ADVAIR) 250-50 MCG/DOSE AEPB Inhale 1 puff into the lungs 2 (two) times daily.  60 each  11  . lamoTRIgine (LAMICTAL) 100 MG tablet Take 100-200 mg by mouth 2 (two) times daily. 1 tablet in the morning and 2 tablets at night      . Nutritional Supplements (ENSURE HIGH PROTEIN) LIQD Take 1 Can by mouth 3 (three) times daily.  90 Can  2  . OLANZapine (ZYPREXA) 5 MG tablet Take 5 mg by mouth at bedtime.      . ondansetron (ZOFRAN) 4 MG tablet Take 1 tablet (4 mg total) by mouth every 8 (eight) hours as needed for nausea.  20 tablet  0  .  oxyCODONE-acetaminophen (PERCOCET) 10-325 MG per tablet Take 1.5 tablets by mouth every 6 (six) hours as needed for pain. For pain  120 tablet  0  . tiotropium (SPIRIVA) 18 MCG inhalation capsule Place 18 mcg into inhaler and inhale daily.      Marland Kitchen zolpidem (AMBIEN) 5 MG tablet Take 1 tablet (5 mg total) by mouth at bedtime as needed for sleep.  30 tablet  0   No current facility-administered medications for this visit.   Family History  Problem Relation Age of Onset  . Stroke Neg Hx   . Cancer Neg Hx    History   Social History  . Marital Status: Single    Spouse Name: N/A    Number of Children: N/A  . Years of Education: N/A   Social History Main Topics  . Smoking status: Current Every Day Smoker -- 0.12 packs/day for 42 years    Types: Cigarettes  . Smokeless tobacco: Never Used     Comment: trying to quit completely - 2 cigs/day.  . Alcohol Use: No  . Drug Use: No  . Sexual Activity: None   Other Topics Concern  . None   Social History Narrative  . None   Review of  Systems: Pertinent items are noted in HPI.  Objective:  Physical Exam: Filed Vitals:   11/07/12 0814  BP: 114/61  Pulse: 87  Temp: 98.9 F (37.2 C)  TempSrc: Oral  Height: 5\' 1"  (1.549 m)  Weight: 84 lb 12.8 oz (38.465 kg)  SpO2: 98%   Constitutional: Vital signs reviewed.  Patient is an ill appearing caucasian female in no acute distress and cooperative with exam.  Head: Normocephalic and atraumatic Eyes: PERRL, EOMI, conjunctivae normal, No scleral icterus.  Neck: Supple, Trachea midline .  Cardiovascular: RRR, S1 normal, S2 normal, no MRG, pulses symmetric and intact bilaterally Pulmonary/Chest: normal respiratory effort, CTAB, no wheezes, rales, or rhonchi Abdominal: Soft. Non-tender, non-distended, bowel sounds are normal, no masses, organomegaly, or guarding present.  Musculoskeletal: No joint deformities, erythema, or stiffness Neurological: A&O x3, cranial nerve II-XII are grossly intact, no focal motor deficit Skin: Warm, dry and intact. No rash, cyanosis, or clubbing.  Psychiatric: Normal mood and affect.   Assessment & Plan:

## 2012-11-07 NOTE — Patient Instructions (Addendum)
Return to clinic in 1-2 months with Dr. Darci Needle  1. We will refer you to a stomach doctor 2. Please discontinue taking ibuprofen or other NSAIDS 3. I have sent your prescription for a stomach medicine to your pharmacy-please take 1 pill daily    NSAIDs and Peptic Ulcers A peptic ulcer is a defect that forms in the lining of the stomach or first part of the small intestine.  CAUSES  Most peptic ulcers are caused by infection with the bacterium H. pylori (Helicobacter pylori). Some peptic ulcers are caused by prolonged use of NSAIDs (nonsteroidal anti-inflammatory drugs). NSAIDs include aspirin, ibuprofen, and naproxen sodium. SYMPTOMS  An ulcer can cause:  A gnawing, burning pain in the upper abdomen.  Nausea.  Vomiting.  Loss of appetite.  Weight loss.  Fatigue. Normally the stomach has three defenses against digestive juices:   Mucus that coats the stomach lining and shields it from stomach acid.  The chemical bicarbonate that neutralizes stomach acid.  Blood circulation to the stomach lining that aids in cell renewal and repair. NSAIDs hinder all of these protective mechanisms. With the stomach's defenses down, digestive juices can damage the sensitive stomach lining and cause ulcers. TREATMENT   NSAID-induced ulcers usually heal once the person stops taking the medication. Your caregiver may recommend taking antacids to neutralize the acid. Antacids help the healing process and relieve symptoms. Drugs called H2-blockers or proton-pump inhibitors decrease the amount of acid the stomach produces. Medicines that protect the stomach lining also help with healing.  If a person with an NSAID ulcer also tests positive for H. pylori, he or she will be treated with antibiotics.  Surgery may be necessary if an ulcer recurs or fails to heal.  Surgery may also be necessary if complications like:  Severe bleeding.  Perforation.  Obstruction develop. Anyone taking NSAIDs who  experiences symptoms of peptic ulcer should see their caregiver for prompt treatment. Delaying diagnosis and treatment can lead to complications and the need for surgery. FOR MORE INFORMATION National Digestive Diseases Information Clearinghouse: http://digestive.EntertainmentBlogging.co.nz.htm Document Released: 02/04/2004 Document Revised: 05/23/2011 Document Reviewed: 02/13/2007 [facility] Patient Information 2013 [place], [place].

## 2012-11-07 NOTE — Assessment & Plan Note (Addendum)
Pt has been experiencing nausea and vomiting for a couple of months with associated weight loss.  She denies any fever/chills, abdominal pain, or GERD symptoms.  She states the N/V occur mainly before she eats.  She states the emesis usually appears like whatever she has eaten and denies any frank blood or coffee-grounds like appearance.  She does report use of ibuprofen however and she is still smoking 1-2 cigs per day.  Additionally, she has a h/o anemia.  On physical exam, she is cachetic appearing and looks chronically ill, otherwise no abnormalities.  She reports never having any endoscopies.  Possibilities include: PUD, gastric or other malignancy, gastric outlet obstruction, gastroparesis.  PUD is likely given NSAID use and smoking history, but the pt does not c/o abdominal pain; malignancy is possible given her associated weight loss.  Gastric outlet obstruction is possible but pt has no pain.  Unlikely gastroparesis-pt is not known to have DM.  -referral to GI for EGD given alarm symptoms of weight loss and anemia -pt given information regarding NSAIDS and PUD -trial of prilosec 20mg  daily -mail hemoccult cards to pt -f/u in 1-2 months with Dr. Darci Needle, PCP

## 2012-11-11 ENCOUNTER — Encounter: Payer: Self-pay | Admitting: Internal Medicine

## 2012-11-11 NOTE — Progress Notes (Signed)
I saw and evaluated the patient.  I personally confirmed the key portions of the history and exam documented by Dr. Delane Ginger and I reviewed pertinent patient test results.  The assessment, diagnosis, and plan were formulated together and I agree with the documentation in the resident's note. [name] looks quite good today compared to the past. She is living in independent living. Uses her walker when she leaves her apt. She has had multiple fx and is not taking her alendronate. Will not push for her to take it until her GI issues are resolved.

## 2012-11-13 ENCOUNTER — Encounter: Payer: Self-pay | Admitting: *Deleted

## 2012-11-20 ENCOUNTER — Ambulatory Visit (INDEPENDENT_AMBULATORY_CARE_PROVIDER_SITE_OTHER): Payer: Medicare Other | Admitting: Gastroenterology

## 2012-11-20 ENCOUNTER — Encounter: Payer: Self-pay | Admitting: Gastroenterology

## 2012-11-20 VITALS — BP 108/60 | HR 74 | Ht 59.75 in | Wt 81.5 lb

## 2012-11-20 DIAGNOSIS — R7989 Other specified abnormal findings of blood chemistry: Secondary | ICD-10-CM

## 2012-11-20 DIAGNOSIS — F1920 Other psychoactive substance dependence, uncomplicated: Secondary | ICD-10-CM

## 2012-11-20 DIAGNOSIS — F1120 Opioid dependence, uncomplicated: Secondary | ICD-10-CM

## 2012-11-20 DIAGNOSIS — J438 Other emphysema: Secondary | ICD-10-CM

## 2012-11-20 DIAGNOSIS — R110 Nausea: Secondary | ICD-10-CM

## 2012-11-20 DIAGNOSIS — R634 Abnormal weight loss: Secondary | ICD-10-CM

## 2012-11-20 MED ORDER — ESOMEPRAZOLE MAGNESIUM 40 MG PO CPDR: 40.0000 mg | DELAYED_RELEASE_CAPSULE | Freq: Every day | ORAL | Status: DC

## 2012-11-20 NOTE — Patient Instructions (Addendum)
You have been given a separate informational sheet regarding your tobacco use, the importance of quitting and local resources to help you quit.  You have been scheduled for a CT scan of the abdomen and pelvis at [facility] ([address]---this is in the same building as [facility]).   You are scheduled on 11-22-2012 at 9 am. You should arrive 15 minutes prior to your appointment time for registration. Please follow the written instructions below on the day of your exam:  WARNING: IF YOU ARE ALLERGIC TO IODINE/X-RAY DYE, PLEASE NOTIFY RADIOLOGY IMMEDIATELY AT [phone]! YOU WILL BE GIVEN A 13 HOUR PREMEDICATION PREP.  1) Do not eat or drink anything after 4 am (4 hours prior to your test) 2) You have been given 2 bottles of oral contrast to drink. The solution may taste better if refrigerated, but do NOT add ice or any other liquid to this solution. Shake well before drinking.    Drink 1 bottle of contrast @ 7 am (2 hours prior to your exam)  Drink 1 bottle of contrast @ 8 am (1 hour prior to your exam)  You may take any medications as prescribed with a small amount of water except for the following: Metformin, Glucophage, Glucovance, Avandamet, Riomet, Fortamet, Actoplus Met, Janumet, Glumetza or Metaglip. The above medications must be held the day of the exam AND 48 hours after the exam.  The purpose of you drinking the oral contrast is to aid in the visualization of your intestinal tract. The contrast solution may cause some diarrhea. Before your exam is started, you will be given a small amount of fluid to drink. Depending on your individual set of symptoms, you may also receive an intravenous injection of x-ray contrast/dye. Plan on being at [facility] for 30 minutes or long, depending on the type of exam you are having performed.  This test typically takes 30-45 minutes to complete.  If you have any questions regarding your exam or if you need to  reschedule, you may call the CT department at [phone] between the hours of 8:00 am and 5:00 pm, Monday-Friday.  _____________________________________________________________________________________________________________________________________________________________________  Please follow up with Dr. Jarold Motto in one month  Your physician has requested that you go to the basement for the following lab work before leaving today: Lipase Protime Anemia Profile Amylase Celiac Panel  AMA  Alpha 1 Antitrypsin  Alpha Feto-Protein     We have given you samples of the following medication to take: Nexium 40 mg, please take one capsule by mouth once daily If this works well for you please call back for a prescription.

## 2012-11-20 NOTE — Progress Notes (Signed)
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  She is a former 2 pack a day smoker, and allegedly down to 2 cigarettes a day.  She has marked limitation in her exercise and mobility.  She is referred by Teaching Service for evaluation of nausea without any other GI complaints except for slow progressive weight loss.  She denies true reflux symptoms, dysphagia, abdominal pain, change in bowel habits, melena, hematochezia, or any history of hepatitis or pancreatitis.  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  She also is on a variety of inhalers and psychotropic medications for bipolar disorder.  She denies a specific food intolerances, abuse of alcohol or NSAIDs.  Review of her record shows abnormal liver function test one year ago with transaminases approximately 150.  Ultrasound the abdomen at that time was negative and hepatitis serologies were negative.  There was a small right hepatic cyst.  She's not had previous endoscopy and colonoscopies because of her pulmonary status.  Currently addition to her medications she is using Ensure without difficulty.  I have reviewed this patient's present history, medical and surgical past history, allergies and medications.     ROS:   All other review of systems negative except as in present illness she does complain of insomnia, urinary incontinency, chronic depression, chronic arthritis and back pain.  Allergies  Allergen Reactions  . Banana Nausea And Vomiting  . Pollen Extract    Outpatient Prescriptions Prior to Visit  Medication Sig Dispense Refill  . albuterol (PROAIR HFA) 108 (90 BASE) MCG/ACT inhaler INHALE 1 PUFF IN MOUTH EVERY2- 4 HOURS AS NEEDED FOR WHEEZING  18 each  2  . albuterol (PROVENTIL) (2.5 MG/3ML) 0.083% nebulizer solution Take 2.5 mg by nebulization every 6 (six) hours as needed. Shortness of breath      .  alendronate (FOSAMAX) 35 MG tablet Take 35 mg by mouth every 7 (seven) days. Take in the morning with a full glass of water, on an empty stomach, and do not take anything else by mouth or lie down for the next 30 min.      . beclomethasone (BECONASE-AQ) 42 MCG/SPRAY nasal spray Place 2 sprays into the nose 2 (two) times daily. Dose is for each nostril.  25 g  0  . calcium carbonate (OS-CAL) 600 MG TABS Take 600 mg by mouth 2 (two) times daily.      . cetirizine (ZYRTEC) 5 MG tablet Take 1 tablet (5 mg total) by mouth at bedtime as needed for rhinitis.  30 tablet  0  . chlorzoxazone (PARAFON) 500 MG tablet Take 1 tablet (500 mg total) by mouth 3 (three) times daily as needed for muscle spasms.  30 tablet  0  . cholecalciferol (VITAMIN D) 1000 UNITS tablet Take 1,000 Units by mouth daily.      . DULoxetine (CYMBALTA) 30 MG capsule Take 30-60 mg by mouth daily. Takes 30 mg in the morning and 60 mg in the evenings      . fexofenadine-pseudoephedrine (ALLEGRA-D) 60-120 MG per tablet Take 1 tablet by mouth 2 (two) times daily.  60 tablet  2  . Fluticasone-Salmeterol (ADVAIR) 250-50 MCG/DOSE AEPB Inhale 1 puff into the lungs 2 (two) times daily.  60 each  11  . lamoTRIgine (LAMICTAL) 100 MG tablet Take 100-200 mg by mouth 2 (two) times daily. 1 tablet in the morning and 2 tablets at  night      . Nutritional Supplements (ENSURE HIGH PROTEIN) LIQD Take 1 Can by mouth 3 (three) times daily.  90 Can  2  . OLANZapine (ZYPREXA) 5 MG tablet Take 5 mg by mouth at bedtime.      Marland Kitchen omeprazole (PRILOSEC) 20 MG capsule Take 1 capsule (20 mg total) by mouth daily.  30 capsule  3  . ondansetron (ZOFRAN) 4 MG tablet Take 1 tablet (4 mg total) by mouth every 8 (eight) hours as needed for nausea.  20 tablet  0  . oxyCODONE-acetaminophen (PERCOCET) 10-325 MG per tablet Take 1.5 tablets by mouth every 6 (six) hours as needed for pain. For pain  120 tablet  0  . tiotropium (SPIRIVA) 18 MCG inhalation capsule Place 18 mcg into  inhaler and inhale daily.      Marland Kitchen zolpidem (AMBIEN) 5 MG tablet Take 1 tablet (5 mg total) by mouth at bedtime as needed for sleep.  30 tablet  0   No facility-administered medications prior to visit.   Past Medical History  Diagnosis Date  . Depression   . Hypertension   . Suicidal ideation 2007     attempted overdose 2012  Dr. Gwendalyn Ege report  . Bipolar disorder   . Substance abuse      narcotics, alcohol, tobacco  . COPD (chronic obstructive pulmonary disease)     not on home oxygen  . Chronic pain syndrome     follows at pain managment  . DEFICIENCY, VITAMIN D NOS 01/03/2007  . Shortness of breath     "all the time" (01/10/2012)  . Arthritis     "legs, back" (01/10/2012)  . Chronic lower back pain   . Anxiety   . OSTEOPOROSIS 06/17/2009    DEXA 05/2009 : L femur -2.9; R femur -2.5. Alendronate on med list but not taking. Needs addressed ASAP as h/o fractures.    . Sciatic pain    Past Surgical History  Procedure Laterality Date  . Ankle surgery  11/16/2010    s/p ORIF; left  . Cesarean section  1974; 1979   History   Social History  . Marital Status: Single    Spouse Name: N/A    Number of Children: 2  . Years of Education: N/A   Occupational History  . retired    Social History Main Topics  . Smoking status: Current Every Day Smoker -- 0.12 packs/day for 42 years    Types: Cigarettes  . Smokeless tobacco: Never Used     Comment: trying to quit completely - 2 cigs/day.  . Alcohol Use: No  . Drug Use: No  . Sexual Activity: None   Other Topics Concern  . None   Social History Narrative  . None   Family History  Problem Relation Age of Onset  . Stroke Neg Hx   . Cancer Neg Hx         Physical Exam: Pleasant patient in no acute distress on oxygen.  Blood pressure 114/61, pulse 87 and weight 84 pounds.  I cannot appreciate stigmata of chronic liver disease. General well developed well nourished patient in no acute distress, appearing their stated  age Eyes PERRLA, no icterus, fundoscopic exam per opthamologist Skin no lesions noted Neck supple, no adenopathy, no thyroid enlargement, no tenderness Chest clear to percussion and auscultation,,, no wheezes or rhonchi with diminished breath sounds in both lung fields. Heart no significant murmurs, gallops or rubs noted Abdomen no hepatosplenomegaly masses or tenderness, BS normal.  Rectal inspection normal no fissures, or fistulae noted.  No masses or tenderness on digital exam. Stool guaiac negative. Extremities no acute joint lesions, edema, phlebitis or evidence of cellulitis. Neurologic patient oriented x 3, cranial nerves intact, no focal neurologic deficits noted. Psychological mental status normal and normal affect.  Assessment and plan: Severe COPD with probable element of pulmonary hypertension, and I suspect hepatic congestion accounting for abnormal liver enzymes.  Her only complaint today is of" nausea" which is a very nonspecific symptom is probably tied into advancing end-stage pulmonary problems.  I will repeat her liver function tests, amylase, lipase, celiac serologies, and obtain CT scan of the abdomen and pelvis.  She is not a candidate for endoscopy or colonoscopy because of a pulmonary insufficiency.  Anemia profile also ordered for review with a hemoglobin of 11.  Right been on regular oxycodone, she denies problems with constipation.  Family history is noncontributory.  She is to continue other medications as listed , and I will give her an empiric trial of Nexium 40 mg a day to see if this helps her nausea.  If her CT scan is negative, will see her back in one month's time for followup.  Also will check alpha-fetoprotein level and with her emphysema we will check alpha-1 antitrypsin level.  She could have liver disease associated with alpha-1 antitrypsin deficiency.  Also I've encouraged her to continue her Ensure by mouth protein supplements.

## 2012-11-21 ENCOUNTER — Telehealth: Payer: Self-pay | Admitting: Internal Medicine

## 2012-11-21 NOTE — Telephone Encounter (Signed)
Patient rescheduled for 11/27/12 1:00.  She is aware to be NPO x contrast after 9 and drink at 11 and 12

## 2012-11-22 ENCOUNTER — Other Ambulatory Visit: Payer: Medicare Other

## 2012-11-27 ENCOUNTER — Ambulatory Visit (INDEPENDENT_AMBULATORY_CARE_PROVIDER_SITE_OTHER)
Admission: RE | Admit: 2012-11-27 | Discharge: 2012-11-27 | Disposition: A | Discharge status: Home / Self Care | Payer: Medicare Other | Source: Ambulatory Visit | Attending: Gastroenterology | Admitting: Gastroenterology

## 2012-11-27 DIAGNOSIS — R110 Nausea: Secondary | ICD-10-CM

## 2012-11-27 DIAGNOSIS — R7989 Other specified abnormal findings of blood chemistry: Secondary | ICD-10-CM

## 2012-11-27 MED ORDER — IOHEXOL 300 MG/ML  SOLN
75.0000 mL | Freq: Once | INTRAMUSCULAR | Status: AC | PRN
  Administered 2012-11-27: 75 mL via INTRAVENOUS

## 2012-11-28 ENCOUNTER — Telehealth: Payer: Self-pay | Admitting: *Deleted

## 2012-11-28 DIAGNOSIS — K838 Other specified diseases of biliary tract: Secondary | ICD-10-CM

## 2012-11-28 DIAGNOSIS — R945 Abnormal results of liver function studies: Secondary | ICD-10-CM

## 2012-11-28 NOTE — Telephone Encounter (Signed)
Message copied by Florene Glen on Wed Nov 28, 2012 10:58 AM ------      Message from: Mike Gip S      Created: Wed Nov 28, 2012 10:01 AM       Pt need to get labs done that Dr. Jarold Motto had ordered. Let her know the CT shows dilation of bile ducts, otherwise ok, and no definite findings to explain wt loss. She Needs an MRCP, and follow up appt with  Dr. Jarold Motto ------

## 2012-11-28 NOTE — Telephone Encounter (Signed)
lmom for pt to call back. She needs to have the labs drawn Dr Jarold Motto ordered and we added a CMET for the MRCP. MRCP is ordered for 12/05/12 at 5pm; arrive at 4:45 pm [facility]; NPO 4 hours prior.

## 2012-11-28 NOTE — Telephone Encounter (Signed)
Informed pt of CT results and the need for an MRCP; pt is claustrophobic so she was r/s at GI at [address] on 12/07/12 at 1pm. She is to be NPO 4 hours prior to the exam. Pt stated understanding.

## 2012-11-30 ENCOUNTER — Other Ambulatory Visit: Payer: Self-pay | Admitting: Gastroenterology

## 2012-11-30 ENCOUNTER — Other Ambulatory Visit (INDEPENDENT_AMBULATORY_CARE_PROVIDER_SITE_OTHER): Payer: Medicare Other

## 2012-11-30 DIAGNOSIS — R7989 Other specified abnormal findings of blood chemistry: Secondary | ICD-10-CM

## 2012-11-30 DIAGNOSIS — F1920 Other psychoactive substance dependence, uncomplicated: Secondary | ICD-10-CM

## 2012-11-30 DIAGNOSIS — J438 Other emphysema: Secondary | ICD-10-CM

## 2012-11-30 DIAGNOSIS — F1120 Opioid dependence, uncomplicated: Secondary | ICD-10-CM

## 2012-11-30 DIAGNOSIS — R110 Nausea: Secondary | ICD-10-CM

## 2012-11-30 DIAGNOSIS — R634 Abnormal weight loss: Secondary | ICD-10-CM

## 2012-11-30 LAB — FOLATE: Folate: >24.8 ng/mL (ref >5.9)

## 2012-11-30 LAB — IBC PANEL
Saturation Ratios: 23.6 % (ref 20.0–50.0)
Transferrin: 324.5 mg/dL (ref 212.0–360.0)

## 2012-11-30 LAB — VITAMIN B12: Vitamin B-12: 549 pg/mL (ref 211–911)

## 2012-11-30 LAB — LIPASE: Lipase: 18 U/L (ref 11.0–59.0)

## 2012-11-30 LAB — PROTIME-INR: Prothrombin Time: 11.1 s (ref 10.2–12.4)

## 2012-12-03 LAB — CELIAC PANEL 10
Endomysial Screen: NEGATIVE
Gliadin IgA: 4.2 U/mL (ref <20)
IgA: 191 mg/dL (ref 69–380)
Tissue Transglut Ab: 4.2 U/mL (ref <20)
Tissue Transglutaminase Ab, IgA: 3.2 U/mL (ref <20)

## 2012-12-03 LAB — ANTI-SMOOTH MUSCLE/MITOCHOND.: Smooth Muscle Ab: 7 Units (ref 0–19)

## 2012-12-05 ENCOUNTER — Ambulatory Visit (HOSPITAL_COMMUNITY): Payer: Medicare Other

## 2012-12-07 ENCOUNTER — Ambulatory Visit
Admission: RE | Admit: 2012-12-07 | Discharge: 2012-12-07 | Disposition: A | Discharge status: Home / Self Care | Payer: Medicare Other | Source: Ambulatory Visit | Attending: Physician Assistant | Admitting: Physician Assistant

## 2012-12-07 DIAGNOSIS — R945 Abnormal results of liver function studies: Secondary | ICD-10-CM

## 2012-12-07 DIAGNOSIS — K838 Other specified diseases of biliary tract: Secondary | ICD-10-CM

## 2012-12-07 MED ORDER — GADOBENATE DIMEGLUMINE 529 MG/ML IV SOLN
7.0000 mL | Freq: Once | INTRAVENOUS | Status: AC | PRN
  Administered 2012-12-07: 7 mL via INTRAVENOUS

## 2012-12-10 ENCOUNTER — Telehealth: Payer: Self-pay | Admitting: Gastroenterology

## 2012-12-10 MED ORDER — ONDANSETRON 4 MG PO TBDP: ORAL_TABLET | ORAL | Status: DC

## 2012-12-10 NOTE — Telephone Encounter (Signed)
Pt reports she is usually nauseated in the am and after throwing up a couple of times, she's OK for the rest of the day. Yesterday and this am, the nausea has been constant and she throws up the anti emetics.  Per Mike Gip, PA OK to order Zofran ODT and inform pt she has a dilated CBD. We will send the MRCP for Dr Christella Hartigan and Dr Jarold Motto to review; she may need an EUS. Pt states she needs 24 hours notice for transportation issues. She will call back if the zofran does not work.

## 2012-12-24 ENCOUNTER — Encounter: Payer: Self-pay | Admitting: *Deleted

## 2013-01-01 ENCOUNTER — Ambulatory Visit (INDEPENDENT_AMBULATORY_CARE_PROVIDER_SITE_OTHER): Payer: Medicare Other | Admitting: Gastroenterology

## 2013-01-01 ENCOUNTER — Encounter: Payer: Self-pay | Admitting: Gastroenterology

## 2013-01-01 VITALS — BP 110/60 | HR 92 | Ht 61.0 in | Wt 79.0 lb

## 2013-01-01 DIAGNOSIS — R1110 Vomiting, unspecified: Secondary | ICD-10-CM

## 2013-01-01 DIAGNOSIS — J441 Chronic obstructive pulmonary disease with (acute) exacerbation: Secondary | ICD-10-CM

## 2013-01-01 DIAGNOSIS — K3184 Gastroparesis: Secondary | ICD-10-CM

## 2013-01-01 MED ORDER — METOCLOPRAMIDE HCL 10 MG PO TABS: 10.0000 mg | ORAL_TABLET | Freq: Every day | ORAL | Status: DC

## 2013-01-01 NOTE — Patient Instructions (Signed)
We have sent the following medications to your pharmacy for you to pick up at your convenience: Reglan Please call in 10-14 days and let us know how you are doing. CC:  Windell Hummingbird MD

## 2013-01-01 NOTE — Progress Notes (Signed)
History of Present Illness: This is a 62 year old Caucasian female with" morning sickness" consistent of nausea and vomiting in the morning of partially digested food products.  Recent workup including labs and CT scan were all unremarkable.  Mild ductal dilatation Limited MRCP which was unremarkable.  Labs also were uneventful.  She is on Prilosec 20 mg a day for acid reflux and denies any GERD whatsoever.  Other medications are listed and reviewed include sublingual Zofran, oxycodone, Lamictal,zyprexa and spirva along with constant oxygen administration for end-stage COPD.  She has not been felt to be an endoscopic candidate because of her severe COPD and lack of epigastric pain, anorexia or weight loss or any weakness suspect pancreatic or biliary disease otherwise.  He has regular bowel movements without melena or hematochezia.    Current Medications, Allergies, Past Medical History, Past Surgical History, Family History and Social History were reviewed in [employer] record.  ROS: All systems were reviewed and are negative unless otherwise stated in the HPI.         Assessment and plan: I think that this patient's symptoms are most likely secondary to her severe COPD which is oxygen dependent and seems to be advancing in nature.  I see no contraindication to a trial of Reglan 10 mg at bedtime, and I've explained side effects to watch for and to stop if these occur.  I do not think she would get in trouble with a small nocturnal dosage of Reglan to 10 mg.  I've also encouraged her to use protein enteral supplements to maintain her weight.  Ultrasound exam showed no evidence of cholelithiasis.  Amylase, lipase, liver profile, alpha-fetoprotein, and celiac profile all normal.  She also has normal alpha 1 antitrypsin level.  Chest x-rays CT scans looked like severe emphysema.  She may need gastric emptying scan depending on her results and clinical course.

## 2013-02-06 ENCOUNTER — Encounter: Payer: Self-pay | Admitting: Internal Medicine

## 2013-02-06 ENCOUNTER — Encounter: Payer: Medicare Other | Admitting: Internal Medicine

## 2013-02-06 ENCOUNTER — Ambulatory Visit: Payer: Medicare Other | Admitting: Internal Medicine

## 2013-02-19 ENCOUNTER — Other Ambulatory Visit: Payer: Self-pay | Admitting: *Deleted

## 2013-02-20 MED ORDER — ALBUTEROL SULFATE HFA 108 (90 BASE) MCG/ACT IN AERS: INHALATION_SPRAY | RESPIRATORY_TRACT | Status: DC

## 2013-02-26 ENCOUNTER — Encounter: Payer: Self-pay | Admitting: Internal Medicine

## 2013-02-26 ENCOUNTER — Other Ambulatory Visit: Payer: Self-pay | Admitting: Internal Medicine

## 2013-02-26 ENCOUNTER — Ambulatory Visit (INDEPENDENT_AMBULATORY_CARE_PROVIDER_SITE_OTHER): Payer: Medicare Other | Admitting: Internal Medicine

## 2013-02-26 VITALS — BP 114/64 | HR 97 | Temp 100.6°F | Ht 61.0 in | Wt 78.2 lb

## 2013-02-26 DIAGNOSIS — E785 Hyperlipidemia, unspecified: Secondary | ICD-10-CM

## 2013-02-26 DIAGNOSIS — J449 Chronic obstructive pulmonary disease, unspecified: Secondary | ICD-10-CM

## 2013-02-26 DIAGNOSIS — R634 Abnormal weight loss: Secondary | ICD-10-CM

## 2013-02-26 DIAGNOSIS — Z23 Encounter for immunization: Secondary | ICD-10-CM

## 2013-02-26 LAB — LIPID PANEL
LDL Cholesterol: 76 mg/dL (ref 0–99)
Triglycerides: 83 mg/dL (ref <150)
VLDL: 17 mg/dL (ref 0–40)

## 2013-02-26 MED ORDER — TIOTROPIUM BROMIDE MONOHYDRATE 18 MCG IN CAPS: 18.0000 ug | ORAL_CAPSULE | Freq: Every day | RESPIRATORY_TRACT | Status: DC

## 2013-02-26 NOTE — Patient Instructions (Signed)
1. continue home O2 2 L 2. please start to use the inhaler daily 3. we'll give a flu shot today 4 followup with clinic in 2-3 month   Chronic Obstructive Pulmonary Disease Chronic obstructive pulmonary disease (COPD) is a condition in which airflow from the lungs is restricted. The lungs can never return to normal, but there are measures you can take which will improve them and make you feel better. CAUSES   Smoking.  Exposure to secondhand smoke.  Breathing in irritants such as air pollution, dust, cigarette smoke, strong odors, aerosol sprays, or paint fumes.  History of lung infections. SYMPTOMS   Deep, persistent (chronic) cough with a large amount of thick mucus.  Wheezing.  Shortness of breath, especially with physical activity.  Feeling like you cannot get enough air.  Difficulty breathing.  Rapid breaths (tachypnea).  Gray or bluish discoloration (cyanosis) of the skin, especially in fingers, toes, or lips.  Fatigue.  Weight loss.  Swelling in legs, ankles, or feet.  Fast heartbeat (tachycardia).  Frequent lung infections.   Chest tightness. DIAGNOSIS  Initial diagnosis may be based on your history, symptoms, and physical examination. Additional tests for COPD may include:  Chest X-ray.  Computed tomography (CT) scan.  Lung (pulmonary) function tests.  Blood tests. TREATMENT  Treatment focuses on making you comfortable (supportive care). Your caregiver may prescribe medicines (inhaled or pills) to help improve your breathing. Additional treatment options may include oxygen therapy and pulmonary rehabilitation. Treatment should also include reducing your exposure to known irritants and following a plan to stop smoking. HOME CARE INSTRUCTIONS   Take all medicines, including antibiotic medicines, as directed by your caregiver.  Use inhaled medicines as directed by your caregiver.  Avoid medicines or cough syrups that dry up your airway  (antihistamines) and slow down the elimination of secretions. This decreases respiratory capacity and may lead to infections.  If you smoke, stop smoking.  Avoid exposure to smoke, chemicals, and fumes that aggravate your breathing.  Avoid contact with individuals that have a contagious illness.  Avoid extreme temperature and humidity changes.  Use humidifiers at home and at your bedside if they do not make breathing difficult.  Drink enough water and fluids to keep your urine clear or pale yellow. This loosens secretions.  Eat healthy foods. Eating smaller, more frequent meals and resting before meals may help you maintain your strength.  Ask your caregiver about the use of vitamins and mineral supplements.  Stay active. Exercise and physical activity will help maintain your ability to do things you want to do.  Balance activity with periods of rest.  Assume a position of comfort if you become short of breath.  Learn and use relaxation techniques.  Learn and use controlled breathing techniques as directed by your caregiver. Controlled breathing techniques include:  Pursed lip breathing. This breathing technique starts with breathing in (inhaling) through your nose for 1 second. Next, purse your lips as if you were going to whistle. Then breathe out (exhale) through the pursed lips for 2 seconds.  Diaphragmatic breathing. Start by putting one hand on your abdomen just above your waist. Inhale slowly through your nose. The hand on your abdomen should move out. Then exhale slowly through pursed lips. You should be able to feel the hand on your abdomen moving in as you exhale.  Learn and use controlled coughing to clear mucus from your lungs. Controlled coughing is a series of short, progressive coughs. The steps of controlled coughing are: 1.  Lean your head slightly forward. 2. Breathe in deeply using diaphragmatic breathing. 3. Try to hold your breath for 3 seconds. 4. Keep your  mouth slightly open while coughing twice. 5. Spit any mucus out into a tissue. 6. Rest and repeat the steps once or twice as needed.  Receive all protective vaccines your caregiver suggests, especially pneumococcal and influenza vaccines.  Learn to manage stress.  Schedule and attend all follow-up appointments as directed by your caregiver. It is important to keep all your appointments.  Participate in pulmonary rehabilitation as directed by your caregiver.  Use home oxygen as suggested. SEEK MEDICAL CARE IF:   You are coughing up more mucus than usual.  There is a change in the color or thickness of the mucus.  Breathing is more labored than usual.  Your breathing is faster than usual.  Your skin color is more cyanotic than usual.  You are running out of the medicine you take for your breathing.  You are anxious, apprehensive, or restless.  You have a fever. SEEK IMMEDIATE MEDICAL CARE IF:   You have a rapid heart rate.  You have shortness of breath while you are resting.  You have shortness of breath that prevents you from being able to talk.  You have shortness of breath that prevents you from performing your usual physical activities.  You have chest pain lasting longer than 5 minutes.  You have a seizure.  Your family or friends notice that you are agitated or confused. MAKE SURE YOU:   Understand these instructions.  Will watch your condition.  Will get help right away if you are not doing well or get worse. Document Released: 12/08/2004 Document Revised: 11/23/2011 Document Reviewed: 10/25/2012 [facility] Patient Information 2014 [place], [place].

## 2013-02-26 NOTE — Progress Notes (Signed)
Patient: [name]   MRN: [id number]  DOB: [birth year]  PCP: Jordan Hummingbird, MD   Subjective:    CC: COPD and Medication Refill   HPI: [name] is a 62 y.o. female with a PMHx of COPD with home O2, HTN, polysubstance abuse (narcotics, alcohol and tobacco), chronic pain syndrome, anxiety, depression, bipolar disorder, who presented to clinic today for face to face home O2 recertification:  # COPD with home O2 2 L  Patient states that she feels okay.  She reports that her breathing and shortness breath is at baseline.  She reports that she's compliant with her treatment of albuterol PRN and Advair inhaler.  She has Spiriva on her medication list, however, she states that she has not been taking this med and is unsure why.  She is here for evaluation of face to face home oxygen recertification. Of note, she continues to smoke 2 cig daily.   Health maintenance -Influenza vaccines given today   Review of Systems: Per HPI.   Current Outpatient Medications: Current Outpatient Prescriptions  Medication Sig Dispense Refill  . albuterol (PROAIR HFA) 108 (90 BASE) MCG/ACT inhaler INHALE 1 PUFF IN MOUTH EVERY2- 4 HOURS AS NEEDED FOR WHEEZING  18 g  1  . albuterol (PROVENTIL) (2.5 MG/3ML) 0.083% nebulizer solution Take 2.5 mg by nebulization every 6 (six) hours as needed. Shortness of breath      . alendronate (FOSAMAX) 35 MG tablet Take 35 mg by mouth every 7 (seven) days. Take in the morning with a full glass of water, on an empty stomach, and do not take anything else by mouth or lie down for the next 30 min.      Marland Kitchen AMBULATORY NON FORMULARY MEDICATION Continuous O2 @@ 2LPM      . calcium carbonate (OS-CAL) 600 MG TABS Take 600 mg by mouth 2 (two) times daily.      . cetirizine (ZYRTEC) 5 MG tablet Take 1 tablet (5 mg total) by mouth at bedtime as needed for rhinitis.  30 tablet  0  . cholecalciferol (VITAMIN D) 1000 UNITS tablet Take 1,000 Units by mouth daily.      .  DULoxetine (CYMBALTA) 30 MG capsule Take 30-60 mg by mouth daily. Takes 30 mg in the morning and 60 mg in the evenings      . esomeprazole (NEXIUM) 40 MG capsule Take 1 capsule (40 mg total) by mouth daily before breakfast.  15 capsule  0  . fentaNYL (DURAGESIC - DOSED MCG/HR) 75 MCG/HR Place 75 mcg onto the skin every 3 (three) days.      . fexofenadine-pseudoephedrine (ALLEGRA-D) 60-120 MG per tablet Take 1 tablet by mouth 2 (two) times daily.  60 tablet  2  . Fluticasone-Salmeterol (ADVAIR) 250-50 MCG/DOSE AEPB Inhale 1 puff into the lungs 2 (two) times daily.  60 each  11  . lamoTRIgine (LAMICTAL) 100 MG tablet Take 100-200 mg by mouth 2 (two) times daily. 1 tablet in the morning and 2 tablets at night      . metoCLOPramide (REGLAN) 10 MG tablet Take 1 tablet (10 mg total) by mouth at bedtime.  30 tablet  6  . Nutritional Supplements (ENSURE HIGH PROTEIN) LIQD Take 1 Can by mouth 3 (three) times daily.  90 Can  2  . OLANZapine (ZYPREXA) 5 MG tablet Take 5 mg by mouth at bedtime.      Marland Kitchen omeprazole (PRILOSEC) 20 MG capsule Take 1 capsule (20 mg total) by mouth daily.  30 capsule  3  . ondansetron (ZOFRAN ODT) 4 MG disintegrating tablet Place one tablet onto tongue to dissolve every 8 hours as needed for nausea.  30 tablet  0  . ondansetron (ZOFRAN) 4 MG tablet Take 1 tablet (4 mg total) by mouth every 8 (eight) hours as needed for nausea.  20 tablet  0  . oxyCODONE (ROXICODONE) 15 MG immediate release tablet Take 15 mg by mouth every 6 (six) hours as needed for pain.      . Tapentadol HCl (NUCYNTA) 100 MG TABS Take 100 mg by mouth 4 (four) times daily.      Marland Kitchen zolpidem (AMBIEN) 5 MG tablet Take 1 tablet (5 mg total) by mouth at bedtime as needed for sleep.  30 tablet  0  . beclomethasone (BECONASE-AQ) 42 MCG/SPRAY nasal spray Place 2 sprays into the nose 2 (two) times daily. Dose is for each nostril.  25 g  0  . tiotropium (SPIRIVA) 18 MCG inhalation capsule Place 1 capsule (18 mcg total) into  inhaler and inhale daily.  32 capsule  11   No current facility-administered medications for this visit.    Allergies: Allergies  Allergen Reactions  . Banana Nausea And Vomiting  . Pollen Extract     Past Medical History  Diagnosis Date  . Depression   . Hypertension   . Suicidal ideation 2007     attempted overdose 2012  Dr. Gwendalyn Ege report  . Bipolar disorder   . Substance abuse      narcotics, alcohol, tobacco  . COPD (chronic obstructive pulmonary disease)     not on home oxygen  . Chronic pain syndrome     follows at pain managment  . DEFICIENCY, VITAMIN D NOS 01/03/2007  . Shortness of breath     "all the time" (01/10/2012)  . Arthritis     "legs, back" (01/10/2012)  . Chronic lower back pain   . Anxiety   . OSTEOPOROSIS 06/17/2009    DEXA 05/2009 : L femur -2.9; R femur -2.5. Alendronate on med list but not taking. Needs addressed ASAP as h/o fractures.    . Sciatic pain   . Anemia   . Elevated liver function tests     Objective:    Physical Exam: Filed Vitals:   02/26/13 1056  BP: 114/64  Pulse: 97  Temp: 100.6 F (38.1 C)--she drinks hot coffee during the clinic visit.   TempSrc: Oral  Height: 5\' 1"  (1.549 m)  Weight: 78 lb 3.2 oz (35.471 kg)  SpO2: 92%    General:  fragile lady with a chronic ill appearing.  Baseline shortness of breath   Head: Normocephalic, atraumatic.  Lungs:  Wear 2 L . Baseline SOB, Clear to auscultation BL without crackles. Occasional exp. wheezes.  Heart: RRR. S1 and S2 normal without gallop, rubs, murmur.  Abdomen:  BS normoactive. Soft, Nondistended, non-tender.  No masses or organomegaly.  Extremities: No pretibial edema.   Assessment/ Plan:    no

## 2013-02-26 NOTE — Addendum Note (Signed)
Addended byDierdre Searles, Jocelyn Lowery on: 02/26/2013 12:30 PM   Modules accepted: Orders

## 2013-02-26 NOTE — Progress Notes (Signed)
SATURATION QUALIFICATIONS: (This note is used to comply with regulatory documentation for home oxygen)  Patient Saturations on Room Air at Rest = 94%   Patient Saturations on Room Air while Ambulating = 87-88%   Patient Saturations on 2 Liters of oxygen while Ambulating = 91-93%

## 2013-02-26 NOTE — Assessment & Plan Note (Addendum)
Assessment: She has COPD GOLD IV.  Patient is at her baseline and here for home O2 recertification. Her O2Sat was 87% on RA with ambulation, and 91-92% on 2L [place] with ambulation.  PFT in June 2014 reviewed.  FEV1        28 % pred prebronch and 32% pred post bronch FEV1/FVC 59%  pred prebronch and 60% pred post bronch   Plan - she will need continuous home O2 2L [place] - will give her influenza vaccine today. Tylenol 650 mg po x 1 given before the injection.  - reinforcement of importance of medical compliance especially taking Spiriva. One year refill on Spiriva sent to her pharmacy.  - patient lack of knowledge of COPD disease and does not know purse lip breathing techniques. Detailed verbal and written educations on COPD home care measure given to patient. - Will send referral for pulmonary rehabilitation given her Gold IV COPD. -She understands above information and agrees with the plan.

## 2013-02-26 NOTE — Progress Notes (Signed)
Case discussed with Dr. Li soon after the resident saw the patient. We reviewed the resident's history and exam and pertinent patient test results. I agree with the assessment, diagnosis, and plan of care documented in the resident's note. 

## 2013-02-27 ENCOUNTER — Other Ambulatory Visit: Payer: Self-pay

## 2013-02-27 ENCOUNTER — Observation Stay (HOSPITAL_BASED_OUTPATIENT_CLINIC_OR_DEPARTMENT_OTHER)
Admission: EM | Admit: 2013-02-27 | Discharge: 2013-02-28 | Disposition: A | Discharge status: Home Health | Payer: Medicare Other | Source: Home / Self Care | Attending: Emergency Medicine | Admitting: Emergency Medicine

## 2013-02-27 ENCOUNTER — Emergency Department (HOSPITAL_COMMUNITY): Payer: Medicare Other

## 2013-02-27 ENCOUNTER — Encounter (HOSPITAL_COMMUNITY): Payer: Self-pay | Admitting: Emergency Medicine

## 2013-02-27 DIAGNOSIS — J329 Chronic sinusitis, unspecified: Secondary | ICD-10-CM | POA: Diagnosis present

## 2013-02-27 DIAGNOSIS — D539 Nutritional anemia, unspecified: Secondary | ICD-10-CM

## 2013-02-27 DIAGNOSIS — J441 Chronic obstructive pulmonary disease with (acute) exacerbation: Secondary | ICD-10-CM | POA: Insufficient documentation

## 2013-02-27 DIAGNOSIS — E871 Hypo-osmolality and hyponatremia: Secondary | ICD-10-CM

## 2013-02-27 DIAGNOSIS — R0602 Shortness of breath: Secondary | ICD-10-CM | POA: Insufficient documentation

## 2013-02-27 DIAGNOSIS — I10 Essential (primary) hypertension: Secondary | ICD-10-CM | POA: Insufficient documentation

## 2013-02-27 DIAGNOSIS — M810 Age-related osteoporosis without current pathological fracture: Secondary | ICD-10-CM | POA: Insufficient documentation

## 2013-02-27 DIAGNOSIS — G894 Chronic pain syndrome: Secondary | ICD-10-CM | POA: Insufficient documentation

## 2013-02-27 DIAGNOSIS — F3130 Bipolar disorder, current episode depressed, mild or moderate severity, unspecified: Secondary | ICD-10-CM

## 2013-02-27 DIAGNOSIS — R1115 Cyclical vomiting syndrome unrelated to migraine: Secondary | ICD-10-CM | POA: Insufficient documentation

## 2013-02-27 DIAGNOSIS — F319 Bipolar disorder, unspecified: Secondary | ICD-10-CM | POA: Insufficient documentation

## 2013-02-27 HISTORY — DX: Dependence on supplemental oxygen: Z99.81

## 2013-02-27 LAB — CBC
MCV: 104.2 fL — ABNORMAL HIGH (ref 78.0–100.0)
Platelets: 330 10*3/uL (ref 150–400)
RBC: 3.09 MIL/uL — ABNORMAL LOW (ref 3.87–5.11)
RDW: 12 % (ref 11.5–15.5)
WBC: 6.9 10*3/uL (ref 4.0–10.5)

## 2013-02-27 LAB — BASIC METABOLIC PANEL
CO2: 29 mEq/L (ref 19–32)
Calcium: 7.7 mg/dL — ABNORMAL LOW (ref 8.4–10.5)
Creatinine, Ser: 0.33 mg/dL — ABNORMAL LOW (ref 0.50–1.10)
GFR calc Af Amer: >90 mL/min (ref >90)
GFR calc non Af Amer: >90 mL/min (ref >90)
Potassium: 4.1 mEq/L (ref 3.5–5.1)
Sodium: 129 mEq/L — ABNORMAL LOW (ref 135–145)

## 2013-02-27 LAB — TSH: TSH: 0.508 u[IU]/mL (ref 0.350–4.500)

## 2013-02-27 LAB — POCT I-STAT TROPONIN I: Troponin i, poc: 0 ng/mL (ref 0.00–0.08)

## 2013-02-27 MED ORDER — ENOXAPARIN SODIUM 40 MG/0.4ML ~~LOC~~ SOLN
40.0000 mg | SUBCUTANEOUS | Status: DC
  Filled 2013-02-27: qty 0.4

## 2013-02-27 MED ORDER — OXYCODONE-ACETAMINOPHEN 5-325 MG PO TABS
1.0000 | ORAL_TABLET | Freq: Once | ORAL | Status: AC
  Administered 2013-02-27: 1 via ORAL
  Filled 2013-02-27: qty 1

## 2013-02-27 MED ORDER — MAGNESIUM SULFATE 40 MG/ML IJ SOLN
2.0000 g | Freq: Once | INTRAMUSCULAR | Status: AC
  Administered 2013-02-27: 2 g via INTRAVENOUS
  Filled 2013-02-27: qty 50

## 2013-02-27 MED ORDER — SODIUM CHLORIDE 0.9 % IV SOLN: 250.0000 mL | INTRAVENOUS | Status: DC | PRN

## 2013-02-27 MED ORDER — METOCLOPRAMIDE HCL 10 MG PO TABS
10.0000 mg | ORAL_TABLET | Freq: Every day | ORAL | Status: DC
  Administered 2013-02-28: 10 mg via ORAL
  Filled 2013-02-27 (×2): qty 1

## 2013-02-27 MED ORDER — AZITHROMYCIN 250 MG PO TABS: 250.0000 mg | ORAL_TABLET | Freq: Every day | ORAL | Status: DC

## 2013-02-27 MED ORDER — DULOXETINE HCL 60 MG PO CPEP
60.0000 mg | ORAL_CAPSULE | Freq: Every day | ORAL | Status: DC
  Administered 2013-02-28: 60 mg via ORAL
  Filled 2013-02-27 (×2): qty 1

## 2013-02-27 MED ORDER — OXYCODONE HCL 5 MG PO TABS
15.0000 mg | ORAL_TABLET | Freq: Four times a day (QID) | ORAL | Status: DC | PRN
  Administered 2013-02-27 – 2013-02-28 (×3): 15 mg via ORAL
  Filled 2013-02-27 (×3): qty 3

## 2013-02-27 MED ORDER — ZOLPIDEM TARTRATE 5 MG PO TABS: 5.0000 mg | ORAL_TABLET | Freq: Every evening | ORAL | Status: DC | PRN

## 2013-02-27 MED ORDER — VITAMIN D3 25 MCG (1000 UNIT) PO TABS
1000.0000 [IU] | ORAL_TABLET | Freq: Every day | ORAL | Status: DC
  Administered 2013-02-28: 1000 [IU] via ORAL
  Filled 2013-02-27: qty 1

## 2013-02-27 MED ORDER — IPRATROPIUM BROMIDE 0.02 % IN SOLN
0.5000 mg | Freq: Four times a day (QID) | RESPIRATORY_TRACT | Status: DC
  Administered 2013-02-28 (×2): 0.5 mg via RESPIRATORY_TRACT
  Filled 2013-02-27 (×2): qty 2.5

## 2013-02-27 MED ORDER — PANTOPRAZOLE SODIUM 40 MG PO TBEC
40.0000 mg | DELAYED_RELEASE_TABLET | Freq: Every day | ORAL | Status: DC
  Administered 2013-02-28: 40 mg via ORAL
  Filled 2013-02-27: qty 1

## 2013-02-27 MED ORDER — ALENDRONATE SODIUM 35 MG PO TABS
35.0000 mg | ORAL_TABLET | ORAL | Status: DC
Start: 2013-02-27 — End: 2013-02-27

## 2013-02-27 MED ORDER — AZITHROMYCIN 500 MG PO TABS
500.0000 mg | ORAL_TABLET | Freq: Every day | ORAL | Status: AC
  Administered 2013-02-28: 500 mg via ORAL
  Filled 2013-02-27 (×2): qty 1

## 2013-02-27 MED ORDER — ALBUTEROL SULFATE (5 MG/ML) 0.5% IN NEBU
5.0000 mg | INHALATION_SOLUTION | Freq: Once | RESPIRATORY_TRACT | Status: AC
  Administered 2013-02-27: 5 mg via RESPIRATORY_TRACT
  Filled 2013-02-27: qty 1

## 2013-02-27 MED ORDER — ENSURE PLUS PO LIQD
1.0000 | Freq: Three times a day (TID) | ORAL | Status: DC
  Filled 2013-02-27 (×7): qty 237

## 2013-02-27 MED ORDER — OLANZAPINE 5 MG PO TABS
5.0000 mg | ORAL_TABLET | Freq: Every day | ORAL | Status: DC
  Administered 2013-02-28: 5 mg via ORAL
  Filled 2013-02-27 (×2): qty 1

## 2013-02-27 MED ORDER — FENTANYL 50 MCG/HR TD PT72
75.0000 ug | MEDICATED_PATCH | TRANSDERMAL | Status: DC
  Administered 2013-02-28: 10:00:00 75 ug via TRANSDERMAL
  Filled 2013-02-27 (×2): qty 1

## 2013-02-27 MED ORDER — GUAIFENESIN-DM 100-10 MG/5ML PO SYRP
5.0000 mL | ORAL_SOLUTION | ORAL | Status: DC | PRN
  Filled 2013-02-27: qty 5

## 2013-02-27 MED ORDER — DULOXETINE HCL 30 MG PO CPEP
30.0000 mg | ORAL_CAPSULE | Freq: Every day | ORAL | Status: DC
  Administered 2013-02-28: 30 mg via ORAL
  Filled 2013-02-27: qty 1

## 2013-02-27 MED ORDER — LEVOFLOXACIN IN D5W 750 MG/150ML IV SOLN
750.0000 mg | Freq: Once | INTRAVENOUS | Status: AC
  Administered 2013-02-27: 750 mg via INTRAVENOUS
  Filled 2013-02-27: qty 150

## 2013-02-27 MED ORDER — ONDANSETRON HCL 4 MG PO TABS: 4.0000 mg | ORAL_TABLET | Freq: Three times a day (TID) | ORAL | Status: DC | PRN

## 2013-02-27 MED ORDER — ALBUTEROL (5 MG/ML) CONTINUOUS INHALATION SOLN
15.0000 mg/h | INHALATION_SOLUTION | Freq: Once | RESPIRATORY_TRACT | Status: AC
Start: 2013-02-27 — End: 2013-02-27
  Administered 2013-02-27: 15 mg/h via RESPIRATORY_TRACT
  Filled 2013-02-27: qty 20

## 2013-02-27 MED ORDER — SODIUM CHLORIDE 0.9 % IJ SOLN
3.0000 mL | Freq: Two times a day (BID) | INTRAMUSCULAR | Status: DC
  Administered 2013-02-28: 3 mL via INTRAVENOUS

## 2013-02-27 MED ORDER — PREDNISONE 50 MG PO TABS
50.0000 mg | ORAL_TABLET | Freq: Every day | ORAL | Status: DC
  Administered 2013-02-28: 50 mg via ORAL
  Filled 2013-02-27 (×2): qty 1

## 2013-02-27 MED ORDER — LAMOTRIGINE 100 MG PO TABS
100.0000 mg | ORAL_TABLET | Freq: Every day | ORAL | Status: DC
  Administered 2013-02-28: 100 mg via ORAL
  Filled 2013-02-27: qty 1

## 2013-02-27 MED ORDER — ALBUTEROL SULFATE (5 MG/ML) 0.5% IN NEBU
2.5000 mg | INHALATION_SOLUTION | Freq: Four times a day (QID) | RESPIRATORY_TRACT | Status: DC
  Administered 2013-02-28 (×2): 2.5 mg via RESPIRATORY_TRACT
  Filled 2013-02-27 (×2): qty 0.5

## 2013-02-27 MED ORDER — SODIUM CHLORIDE 0.9 % IJ SOLN: 3.0000 mL | INTRAMUSCULAR | Status: DC | PRN

## 2013-02-27 MED ORDER — CALCIUM CARBONATE 1250 (500 CA) MG PO TABS
600.0000 mg | ORAL_TABLET | Freq: Two times a day (BID) | ORAL | Status: DC
  Filled 2013-02-27: qty 0.5

## 2013-02-27 MED ORDER — ALBUTEROL SULFATE (5 MG/ML) 0.5% IN NEBU
2.5000 mg | INHALATION_SOLUTION | RESPIRATORY_TRACT | Status: DC | PRN
  Administered 2013-02-28 (×2): 2.5 mg via RESPIRATORY_TRACT
  Filled 2013-02-27 (×2): qty 0.5

## 2013-02-27 MED ORDER — LAMOTRIGINE 200 MG PO TABS
200.0000 mg | ORAL_TABLET | Freq: Every day | ORAL | Status: DC
  Administered 2013-02-28: 200 mg via ORAL
  Filled 2013-02-27 (×2): qty 1

## 2013-02-27 MED ORDER — METHYLPREDNISOLONE SODIUM SUCC 125 MG IJ SOLR
80.0000 mg | Freq: Once | INTRAMUSCULAR | Status: AC
  Administered 2013-02-27: 80 mg via INTRAVENOUS
  Filled 2013-02-27: qty 2

## 2013-02-27 MED ORDER — IPRATROPIUM BROMIDE 0.02 % IN SOLN
0.5000 mg | Freq: Once | RESPIRATORY_TRACT | Status: AC
  Administered 2013-02-27: 0.5 mg via RESPIRATORY_TRACT
  Filled 2013-02-27: qty 2.5

## 2013-02-27 NOTE — ED Provider Notes (Signed)
CSN: [id number]     Arrival date & time 02/27/13  1716 History   First MD Initiated Contact with Patient 02/27/13 1741     Chief Complaint  Patient presents with  . Shortness of Breath   (Consider location/radiation/quality/duration/timing/severity/associated sxs/prior Treatment) HPI  This is a 62 year old female with history of COPD on 2 L nasal cannula home who presents with shortness of breath and wheezing. Patient reports one-day history of symptoms. She reports a chronic productive cough of brown sputum. She denies any fevers. She states that her breathing treatments at home are not helping. She has not had to increase her oxygen requirement at home. She's not currently on prednisone. She denies any chest pain. Past Medical History  Diagnosis Date  . Depression   . Hypertension   . Suicidal ideation 2007     attempted overdose 2012  Dr. Gwendalyn Ege report  . Bipolar disorder   . Substance abuse      narcotics, alcohol, tobacco  . Chronic pain syndrome     follows at pain managment  . DEFICIENCY, VITAMIN D NOS 01/03/2007  . Chronic lower back pain   . Anxiety   . OSTEOPOROSIS 06/17/2009    DEXA 05/2009 : L femur -2.9; R femur -2.5. Alendronate on med list but not taking. Needs addressed ASAP as h/o fractures.    . Sciatic pain   . Anemia   . Elevated liver function tests   . COPD (chronic obstructive pulmonary disease)   . On home oxygen therapy     "2L; 24/7" (02/27/2013)  . Shortness of breath     "all the time" (02/27/2013)  . Arthritis     "legs, back; hips, primarily left hip" (02/27/2013)   Past Surgical History  Procedure Laterality Date  . Orif ankle fracture Left 11/16/2010  . Cesarean section  1974; 1979  . Tubal ligation  1979  . Augmentation mammaplasty  ~ 2007   Family History  Problem Relation Age of Onset  . Stroke Neg Hx   . Cancer Neg Hx    History  Substance Use Topics  . Smoking status: Current Every Day Smoker -- 0.20 packs/day for 44 years   Types: Cigarettes  . Smokeless tobacco: Never Used     Comment: 02/27/2013 "smoking 2 cigarettes/day; that's down from 2 ppd"  . Alcohol Use: Yes     Comment: 02/27/2013 "nothing in 6 years; never had a problem w/it"   OB History   Grav Para Term Preterm Abortions TAB SAB Ect Mult Living                 Review of Systems  Constitutional: Negative for fever.  Respiratory: Positive for cough and shortness of breath. Negative for chest tightness.   Cardiovascular: Negative for chest pain and leg swelling.  Gastrointestinal: Negative for nausea, vomiting and abdominal pain.  Genitourinary: Negative for dysuria.  Skin: Negative for wound.  Neurological: Negative for dizziness and headaches.  All other systems reviewed and are negative.    Allergies  Banana and Pollen extract  Home Medications   No current outpatient prescriptions on file. BP 134/65  Pulse 86  Temp(Src) 98.4 F (36.9 C) (Oral)  Resp 16  SpO2 97% Physical Exam  Nursing note and vitals reviewed. Constitutional: She is oriented to person, place, and time.  Chronically ill-appearing, thin, tachypneic  HENT:  Head: Normocephalic and atraumatic.  Mucous membranes dry  Eyes: Pupils are equal, round, and reactive to light.  Neck: Neck supple.  Cardiovascular: Normal rate, regular rhythm and normal heart sounds.   No murmur heard. Pulmonary/Chest: No respiratory distress. She has wheezes. She has no rales.  Tachypnea with increased work of breathing, prolonged expiratory phase with wheezing in all lung fields, fair air movement  Abdominal: Soft. There is no tenderness.  Musculoskeletal: She exhibits no edema.  Neurological: She is alert and oriented to person, place, and time.  Skin: Skin is warm and dry.  Psychiatric: She has a normal mood and affect.    ED Course  Procedures (including critical care time) Labs Review Labs Reviewed  BASIC METABOLIC PANEL - Abnormal; Notable for the following:    Sodium  129 (*)    Chloride 90 (*)    Creatinine, Ser 0.33 (*)    Calcium 7.7 (*)    All other components within normal limits  CBC - Abnormal; Notable for the following:    RBC 3.09 (*)    Hemoglobin 10.9 (*)    HCT 32.2 (*)    MCV 104.2 (*)    MCH 35.3 (*)    All other components within normal limits  POCT I-STAT TROPONIN I   Imaging Review Dg Chest Portable 1 View  02/27/2013   CLINICAL DATA:  Difficulty breathing  EXAM: PORTABLE CHEST - 1 VIEW  COMPARISON:  Portable exam 1837 hr compared to 05/07/2012  FINDINGS: Normal heart size, mediastinal contours, and pulmonary vascularity.  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  Mild scattered interstitial prominence at the lower lungs, likely unchanged accounting for differences in technique.  No acute infiltrate, pleural effusion or pneumothorax.  No acute osseous findings.  IMPRESSION: COPD changes.  No acute abnormalities.   Electronically Signed   By: Ulyses Southward M.D.   On: 02/27/2013 18:57    EKG Interpretation    Date/Time:    Ventricular Rate:    PR Interval:    QRS Duration:   QT Interval:    QTC Calculation:   R Axis:     Text Interpretation:              MDM   1. COPD exacerbation    This is a 85 -year-old female with COPD on home oxygen who presents with shortness of breath and wheezing. She is in mild respiratory distress on exam. Patient was given steroids, continuous nebulization. X-rays negative for pneumonia. Patient was given Levaquin given history of COPD. Lab work is reassuring. Patient continues to wheeze following continuous nebulization. Patient will be admitted for further management of acute COPD exacerbation.   Shon Baton, MD 02/28/13 [phone]

## 2013-02-27 NOTE — ED Notes (Addendum)
Horton, MD verified that Oxycodone 15 mg is not contraindicated to admin at this time, percocet 5-325 mg given 20:02

## 2013-02-27 NOTE — Progress Notes (Signed)
PHARMACIST - PHYSICIAN COMMUNICATION  CONCERNING: P&T Medication Policy Regarding Oral Bisphosphonates  RECOMMENDATION: Your order for alendronate (Fosamax), ibandronate (Boniva), or risedronate (Actonel) has been discontinued at this time.  If the patient's post-hospital medical condition warrants safe use of this class of drugs, please resume the pre-hospital regimen upon discharge.  DESCRIPTION:  Alendronate (Fosamax), ibandronate (Boniva), and risedronate (Actonel) can cause severe esophageal erosions in patients who are unable to remain upright at least 30 minutes after taking this medication.   Since brief interruptions in therapy are thought to have minimal impact on bone mineral density, the Pharmacy & Therapeutics Committee has established that bisphosphonate orders should be routinely discontinued during hospitalization.   To override this safety policy and permit administration of Boniva, Fosamax, or Actonel in the hospital, prescribers must write "DO NOT HOLD" in the comments section when placing the order for this class of medications.   Janice Coffin

## 2013-02-27 NOTE — H&P (Signed)
Date: 02/27/2013               Patient Name:  [name] MRN: [id number]  DOB: [birth year] Age / Sex: 62 y.o., female   PCP: Windell Hummingbird, MD         Medical Service: Internal Medicine Teaching Service     Attending physician: Dr. Criselda Peaches  Internal Medicine Teaching Service Contact Information  Weekday Hours (7AM-5PM):   1st Contact: Dr. Vivi Barrack          Pager:[phone] 2nd Contact: Dr. Leonia Reeves       Pager: [phone]  ** If no return call within 15 minutes (after trying both pagers listed above), please call after hours pagers.   After 5 pm or weekends: 1st Contact: Pager: [phone] 2nd Contact: Pager: [phone]  Chief Complaint: SOB, cough  History of Present Illness:  This is a 62yo WF w/ PMH of COPD (GOLD IV on 2LPM O2 at home), depression, chronic pain, bipolar disorder, chronic N/V (unknown etiology, followed by GI) who presents w/ c/o worsening of her baseline cough and SOB x 1 week. Patient also reports that her cough is productive of brown sputum at baseline, but that the amount of sputum has increased recently. Pt has been taking her albuterol nebulizer TID, which is what she usually does, but has been taking her albuterol inhaler 4-5 times daily (usually takes this 2-3 times daily). She is complaint with her Advair, but has not been taking Spiriva for months. She was told by Dr. Dierdre Searles yesterday in clinic to continue the Spiriva, so she restarted taking this medication yesterday. Patient does have chronic N/V that is worse in the mornings for which she has been evaluated by Dr. Jarold Motto with GI. Per his note, he thinks these symptoms are 2/2 her severe and progressive COPD. She takes reglan qHS and zofran prn for her symptoms. Pt denies F/C, CP, URI symptoms, abd pain, diarrhea, BLE edema. She lives alone and has a [facility] aide that comes to help hear each morning. No sick contacts. She was seen in clinic yesterday and noted to be at her baseline health status, though did  have a T 100.6. She received the flu shot yesterday as well.   In the ED, T 98.9 (though 100.6 in clinic yesterday), BP 132/77, HR 86, RR 20, SpO2 91% O2 2LPM. BMP Na 129, Ca 7.7. CBC Hb 10.9 (baseline). POC troponin negative. CXR no acute process. Patient received albuterol/atrovent nebs, solumedrol 80mg  x 1 dose, levaquin 750mg  x 1 dose, magnesium 2g IV.   Meds: No current facility-administered medications for this encounter.   Current Outpatient Prescriptions  Medication Sig Dispense Refill  . albuterol (PROAIR HFA) 108 (90 BASE) MCG/ACT inhaler INHALE 1 PUFF IN MOUTH EVERY2- 4 HOURS AS NEEDED FOR WHEEZING  18 g  1  . albuterol (PROVENTIL) (2.5 MG/3ML) 0.083% nebulizer solution Take 2.5 mg by nebulization every 6 (six) hours as needed. Shortness of breath      . alendronate (FOSAMAX) 35 MG tablet Take 35 mg by mouth every 7 (seven) days. Take in the morning with a full glass of water, on an empty stomach, and do not take anything else by mouth or lie down for the next 30 min.      Marland Kitchen AMBULATORY NON FORMULARY MEDICATION Continuous O2 @@ 2LPM      . beclomethasone (BECONASE-AQ) 42 MCG/SPRAY nasal spray Place 2 sprays into the nose 2 (two) times daily. Dose is for each nostril.  25  g  0  . calcium carbonate (OS-CAL) 600 MG TABS Take 600 mg by mouth 2 (two) times daily.      . cetirizine (ZYRTEC) 5 MG tablet Take 1 tablet (5 mg total) by mouth at bedtime as needed for rhinitis.  30 tablet  0  . cholecalciferol (VITAMIN D) 1000 UNITS tablet Take 1,000 Units by mouth daily.      . DULoxetine (CYMBALTA) 30 MG capsule Take 30-60 mg by mouth daily. Takes 30 mg in the morning and 60 mg in the evenings      . esomeprazole (NEXIUM) 40 MG capsule Take 1 capsule (40 mg total) by mouth daily before breakfast.  15 capsule  0  . fentaNYL (DURAGESIC - DOSED MCG/HR) 75 MCG/HR Place 75 mcg onto the skin every 3 (three) days.      . fexofenadine-pseudoephedrine (ALLEGRA-D) 60-120 MG per tablet Take 1 tablet by  mouth 2 (two) times daily.  60 tablet  2  . Fluticasone-Salmeterol (ADVAIR) 250-50 MCG/DOSE AEPB Inhale 1 puff into the lungs 2 (two) times daily.  60 each  11  . lamoTRIgine (LAMICTAL) 100 MG tablet Take 100-200 mg by mouth 2 (two) times daily. 1 tablet in the morning and 2 tablets at night      . metoCLOPramide (REGLAN) 10 MG tablet Take 1 tablet (10 mg total) by mouth at bedtime.  30 tablet  6  . Nutritional Supplements (ENSURE HIGH PROTEIN) LIQD Take 1 Can by mouth 3 (three) times daily.  90 Can  2  . OLANZapine (ZYPREXA) 5 MG tablet Take 5 mg by mouth at bedtime.      Marland Kitchen omeprazole (PRILOSEC) 20 MG capsule Take 1 capsule (20 mg total) by mouth daily.  30 capsule  3  . ondansetron (ZOFRAN) 4 MG tablet Take 1 tablet (4 mg total) by mouth every 8 (eight) hours as needed for nausea.  20 tablet  0  . oxyCODONE (ROXICODONE) 15 MG immediate release tablet Take 15 mg by mouth every 6 (six) hours as needed for pain.      . Tapentadol HCl (NUCYNTA) 100 MG TABS Take 100 mg by mouth 4 (four) times daily.      Marland Kitchen tiotropium (SPIRIVA) 18 MCG inhalation capsule Place 1 capsule (18 mcg total) into inhaler and inhale daily.  32 capsule  11  . zolpidem (AMBIEN) 5 MG tablet Take 1 tablet (5 mg total) by mouth at bedtime as needed for sleep.  30 tablet  0   Pt does not take zyrtec, allegra, omeprazole or nexium; she is unsure if she takes reglan and zofran  Allergies: Allergies as of 02/27/2013 - Review Complete 02/27/2013  Allergen Reaction Noted  . Banana Nausea And Vomiting 01/10/2012  . Pollen extract  11/20/2012   Past Medical History  Diagnosis Date  . Depression   . Hypertension   . Suicidal ideation 2007     attempted overdose 2012  Dr. Gwendalyn Ege report  . Bipolar disorder   . Substance abuse      narcotics, alcohol, tobacco  . COPD (chronic obstructive pulmonary disease)     not on home oxygen  . Chronic pain syndrome     follows at pain managment  . DEFICIENCY, VITAMIN D NOS 01/03/2007  .  Shortness of breath     "all the time" (01/10/2012)  . Arthritis     "legs, back" (01/10/2012)  . Chronic lower back pain   . Anxiety   . OSTEOPOROSIS 06/17/2009    DEXA  05/2009 : L femur -2.9; R femur -2.5. Alendronate on med list but not taking. Needs addressed ASAP as h/o fractures.    . Sciatic pain   . Anemia   . Elevated liver function tests    Past Surgical History  Procedure Laterality Date  . Ankle surgery  11/16/2010    s/p ORIF; left  . Cesarean section  1974; 1979   Family History  Problem Relation Age of Onset  . Stroke Neg Hx   . Cancer Neg Hx    History   Social History  . Marital Status: Single    Spouse Name: N/A    Number of Children: 2  . Years of Education: N/A   Occupational History  . retired    Social History Main Topics  . Smoking status: Current Every Day Smoker -- 0.12 packs/day for 42 years    Types: Cigarettes  . Smokeless tobacco: Never Used     Comment: trying to quit completely - 2 cigs/day.  . Alcohol Use: No  . Drug Use: No  . Sexual Activity: Not on file   Other Topics Concern  . Not on file   Social History Narrative  . No narrative on file    Review of Systems: General: no fevers, chills, changes in weight, changes in appetite Skin: no rash HEENT: no nasal congestion, sore throat Pulm: see HPI CV: no chest pain, palpitations Abd: +nausea/vomiting; no abdominal pain, diarrhea/constipation GU: no dysuria Ext: +intermittent R sided "muscle cramp" but nothing now; no arthralgias, myalgias Neuro: no weakness, numbness, or tingling  Physical Exam: Blood pressure 131/58, pulse 91, temperature 98.9 F (37.2 C), temperature source Oral, resp. rate 13, SpO2 91.00%. on 2LPM O2 [place]  General: alert, cooperative, and in no apparent distress; speaking in complete sentences HEENT: pupils equal round and reactive to light, vision grossly intact, oropharynx clear and non-erythematous, MMM  Neck: supple, no lymphadenopathy Lungs:  diffuse wheezing bilaterally w/ increased work of expiration on deep breathing Heart: regular rate and rhythm, no murmurs, gallops, or rubs Abdomen: soft, non-tender, non-distended, normal bowel sounds Extremities: warm extremities, no BLE edema Neurologic: alert & oriented X3, cranial nerves II-XII grossly intact, strength grossly intact, sensation intact to light touch  Lab results: Basic Metabolic Panel:  Recent Labs  28/41/32 1725  NA 129*  K 4.1  CL 90*  CO2 29  GLUCOSE 99  BUN 7  CREATININE 0.33*  CALCIUM 7.7*   CBC:  Recent Labs  02/27/13 1725  WBC 6.9  HGB 10.9*  HCT 32.2*  MCV 104.2*  PLT 330   Fasting Lipid Panel:  Recent Labs  02/26/13 1155  CHOL 184  HDL 91  LDLCALC 76  TRIG 83  CHOLHDL 2.0   Imaging results:  Dg Chest Portable 1 View  02/27/2013   CLINICAL DATA:  Difficulty breathing  EXAM: PORTABLE CHEST - 1 VIEW  COMPARISON:  Portable exam 1837 hr compared to 05/07/2012  FINDINGS: Normal heart size, mediastinal contours, and pulmonary vascularity.  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  Mild scattered interstitial prominence at the lower lungs, likely unchanged accounting for differences in technique.  No acute infiltrate, pleural effusion or pneumothorax.  No acute osseous findings.  IMPRESSION: COPD changes.  No acute abnormalities.   Electronically Signed   By: Ulyses Southward M.D.   On: 02/27/2013 18:57   Other results: EKG: NSR, normal axis and intervals, no ischemic changes; ?Q waves in V1-2.  Assessment & Plan  by Problem:  # Acute COPD Exacerbation:  Patient's worsening shortness of breath and productive cough are most likely caused by an acute COPD exacerbation. She has history of severe COPD, Gold Stage IV (last PFTs 08/2012), but not to compliant her Spiriva recently and she continues to smoke. Other differential diagnoses include, but less likely, pulmonary embolism (No chest pain, no signs of a DVT on  exam, Well's score with low probability),  pneumonia (although patient had mild fever in clinic yesterday, she does not have leukocytosis and chest x-ray is neg for PNA), CHF (no history of CHF, she is euvolemic today).  Patient received albuterol/atrovent nebs, 1 dose of IV Solu-Medrol 80 mg, and a 2 g of magnesium sulfate IV in ED as well as 1 dose of Levaquin in ED. Pt felt somewhat improved when we evaluated her in the ED. - Will admit patient Med-surg - Nebulizers: albuterol and Atrovent every 6 hours, plus albuterol every 2 hours, prn - oral azithromycin for 5 days.  - Oral prednisone 50 mg daily  -O2 therapy - keep O2 sat >92%; - Robitussin for cough  - Patient was given a referral to pulmonary rehabilitation when she was evaluated in clinic yesterday.  # Hyponatremia: Patient has chronic intermittent hyponatremia. This problem was worked up without conclusive diagnosis in the past when she was hospitalized on 01/10/12. It seemed to have a mixed picture. Laboratory studies did not completely fit with SIADH or psychogenic polydipsia. TSH and morning cortisol were normal. Her sodium is 129 on admission, indicating very mild hyponatremia without symptoms. It is perhaps secondary to the complications from psychotic medications . -will f/u BMP as outpatient  # Mental health disorders: Patient carries diagnoses of anxiety, depression, bipolar disorder. She is taking Lamictal, olanzapine, Cymbalta at home. No suicidal homicidal ideation. -will continue home medications  # Macrocytic anemia:  Vitamin B12 and folic acid level were normal in the past on 11/30/12. Her medications such as Lamictal and olanzapine may have partially contributed. Hgb is stable 11.8 on 04/30/12 to 10.9 today. -f/u CBC as outpatient  # Chronic nausea and vomiting: Unclear etiology, per GI, Dr Jarold Motto, patient's symptoms are most likely secondary to her severe COPD. Symptoms are controlled by Zofran and Reglan at home. -will  continue Zofran and Reglan  Dispo: Disposition is deferred at this time, awaiting improvement of current medical problems. Anticipated discharge in approximately 1 day(s).   The patient does have a current PCP Windell Hummingbird, MD) and does need an [facility] 2 hospital follow-up appointment after discharge.  The patient does not have transportation limitations that hinder transportation to clinic appointments.  Signed: Windell Hummingbird, MD 02/27/2013, 10:18 PM

## 2013-02-27 NOTE — ED Notes (Signed)
SOB all day. Productive cough - brownish.   continuous oxygen @ 2 L. Expiratory wheezing.

## 2013-02-28 MED ORDER — ACETAMINOPHEN 325 MG PO TABS
650.0000 mg | ORAL_TABLET | Freq: Four times a day (QID) | ORAL | Status: DC | PRN
  Administered 2013-02-28: 650 mg via ORAL
  Filled 2013-02-28: qty 2

## 2013-02-28 MED ORDER — ENOXAPARIN SODIUM 40 MG/0.4ML ~~LOC~~ SOLN
40.0000 mg | SUBCUTANEOUS | Status: DC
  Administered 2013-02-28: 40 mg via SUBCUTANEOUS
  Filled 2013-02-28 (×2): qty 0.4

## 2013-02-28 MED ORDER — CALCIUM CARBONATE 1250 (500 CA) MG PO TABS
1250.0000 mg | ORAL_TABLET | Freq: Two times a day (BID) | ORAL | Status: DC
  Administered 2013-02-28: 1250 mg via ORAL
  Filled 2013-02-28 (×2): qty 1

## 2013-02-28 MED ORDER — AZITHROMYCIN 250 MG PO TABS: 250.0000 mg | ORAL_TABLET | Freq: Every day | ORAL | Status: DC

## 2013-02-28 MED ORDER — PREDNISONE 50 MG PO TABS: 50.0000 mg | ORAL_TABLET | Freq: Every day | ORAL | Status: DC

## 2013-02-28 MED ORDER — ALBUTEROL SULFATE (2.5 MG/3ML) 0.083% IN NEBU: 2.5000 mg | INHALATION_SOLUTION | RESPIRATORY_TRACT | Status: DC | PRN

## 2013-02-28 NOTE — Progress Notes (Signed)
Subjective: Patient seen and examined at the bedside. She reports some shortness of breath with exertion. Otherwise denies chest pain, cough, abdominal pain, nausea, vomiting. She is interested in going home if she can.  Objective: Vital signs in last 24 hours: Filed Vitals:   02/27/13 2200 02/27/13 2230 02/27/13 2351 02/28/13 0511  BP: 136/62 133/59 134/65 141/75  Pulse: 93 83 86 102  Temp:   98.4 F (36.9 C) 98.8 F (37.1 C)  TempSrc:   Oral Oral  Resp: 23 11 16 18   SpO2: 95% 97% 97% 96%   Weight change:   Intake/Output Summary (Last 24 hours) at 02/28/13 0933 Last data filed at 02/28/13 0015  Gross per 24 hour  Intake    360 ml  Output      0 ml  Net    360 ml   Physical Exam:  General: alert, cooperative, and in no apparent distress; speaking in complete sentences HEENT: pupils equal round and reactive to light, vision grossly intact, oropharynx clear and non-erythematous, MMM  Neck: supple, no lymphadenopathy Lungs: mild expiratory wheezing bilaterally, slightly increased I:E ratio Heart: regular rate and rhythm, no murmurs, gallops, or rubs Abdomen: soft, non-tender, non-distended, normal bowel sounds  Extremities: warm extremities, no BLE edema Neurologic: alert & oriented X3, cranial nerves II-XII grossly intact, strength grossly intact, sensation intact to light touch  Lab Results: Basic Metabolic Panel:  Recent Labs Lab 02/27/13 1725  NA 129*  K 4.1  CL 90*  CO2 29  GLUCOSE 99  BUN 7  CREATININE 0.33*  CALCIUM 7.7*   Liver Function Tests: No results found for this basename: AST, ALT, ALKPHOS, BILITOT, PROT, ALBUMIN,  in the last 168 hours No results found for this basename: LIPASE, AMYLASE,  in the last 168 hours No results found for this basename: AMMONIA,  in the last 168 hours CBC:  Recent Labs Lab 02/27/13 1725  WBC 6.9  HGB 10.9*  HCT 32.2*  MCV 104.2*  PLT 330   Cardiac Enzymes: No results found for this basename: CKTOTAL, CKMB,  CKMBINDEX, TROPONINI,  in the last 168 hours BNP: No results found for this basename: PROBNP,  in the last 168 hours D-Dimer: No results found for this basename: DDIMER,  in the last 168 hours CBG: No results found for this basename: GLUCAP,  in the last 168 hours Hemoglobin A1C: No results found for this basename: HGBA1C,  in the last 168 hours Fasting Lipid Panel:  Recent Labs Lab 02/26/13 1155  CHOL 184  HDL 91  LDLCALC 76  TRIG 83  CHOLHDL 2.0   Thyroid Function Tests:  Recent Labs Lab 02/26/13 1155  TSH 0.508   Coagulation: No results found for this basename: LABPROT, INR,  in the last 168 hours Anemia Panel: No results found for this basename: VITAMINB12, FOLATE, FERRITIN, TIBC, IRON, RETICCTPCT,  in the last 168 hours Urine Drug Screen: Drugs of Abuse     Component Value Date/Time   LABOPIA NONE DETECTED 01/10/2012 1339   COCAINSCRNUR NONE DETECTED 01/10/2012 1339   LABBENZ NONE DETECTED 01/10/2012 1339   AMPHETMU NONE DETECTED 01/10/2012 1339   THCU NONE DETECTED 01/10/2012 1339   LABBARB NONE DETECTED 01/10/2012 1339    Alcohol Level: No results found for this basename: ETH,  in the last 168 hours Urinalysis: No results found for this basename: COLORURINE, APPERANCEUR, LABSPEC, PHURINE, GLUCOSEU, HGBUR, BILIRUBINUR, KETONESUR, PROTEINUR, UROBILINOGEN, NITRITE, LEUKOCYTESUR,  in the last 168 hours   Micro Results: No results found  for this or any previous visit (from the past 240 hour(s)). Studies/Results: Dg Chest Portable 1 View  02/27/2013   CLINICAL DATA:  Difficulty breathing  EXAM: PORTABLE CHEST - 1 VIEW  COMPARISON:  Portable exam 1837 hr compared to 05/07/2012  FINDINGS: Normal heart size, mediastinal contours, and pulmonary vascularity.  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  Mild scattered interstitial prominence at the lower lungs, likely unchanged accounting for differences in technique.  No acute  infiltrate, pleural effusion or pneumothorax.  No acute osseous findings.  IMPRESSION: COPD changes.  No acute abnormalities.   Electronically Signed   By: Ulyses Southward M.D.   On: 02/27/2013 18:57   Medications: I have reviewed the patient's current medications. Scheduled Meds: . albuterol  2.5 mg Nebulization Q6H  . [START ON 03/01/2013] azithromycin  250 mg Oral Daily  . calcium carbonate  1,250 mg Oral BID  . cholecalciferol  1,000 Units Oral Daily  . DULoxetine  30 mg Oral Daily  . DULoxetine  60 mg Oral QHS  . enoxaparin (LOVENOX) injection  40 mg Subcutaneous Q24H  . ENSURE PLUS  1 Can Oral TID  . fentaNYL  75 mcg Transdermal Q72H  . ipratropium  0.5 mg Nebulization Q6H  . lamoTRIgine  100 mg Oral Daily  . lamoTRIgine  200 mg Oral QHS  . metoCLOPramide  10 mg Oral QHS  . OLANZapine  5 mg Oral QHS  . pantoprazole  40 mg Oral Daily  . predniSONE  50 mg Oral Q breakfast  . sodium chloride  3 mL Intravenous Q12H   Continuous Infusions:  PRN Meds:.sodium chloride, acetaminophen, albuterol, guaiFENesin-dextromethorphan, ondansetron, oxyCODONE, sodium chloride, zolpidem Assessment/Plan:  #Acute COPD Exacerbation - Patient's worsening shortness of breath and productive cough are most likely caused by an acute COPD exacerbation. She has history of severe COPD, Gold Stage IV (last PFTs 08/2012). She has not been compliant with her Spiriva recently and she continues to smoke. Symptoms have improved s/p albuterol/atrovent nebs, steroids, magnesium sulfate, antibiotics. She is comfortable today and saturating well on 3L. - Nebulizers: DuoNebs every 6 hours, plus albuterol every 2 hours, prn  - Oral azithromycin for 5 days - Oral prednisone 50 mg daily - O2 therapy - keep O2 sat >92% - Robitussin for cough - Patient was given a referral to pulmonary rehabilitation when she was evaluated in clinic this week - Will have nurse walk patient on home O2 to see if she needs additional O2 with  exertion going forward - Medically stable for discharge home later today with close follow up  #Hyponatremia - Patient has chronic intermittent hyponatremia. This problem was worked up without conclusive diagnosis in the past when she was hospitalized on 01/10/12. It seemed to have a mixed picture. Laboratory studies did not completely fit with SIADH or psychogenic polydipsia. TSH and morning cortisol were normal. Her sodium is 129 on admission, indicating very mild hyponatremia without symptoms. It is perhaps secondary to the complications from psychotic medications .  - F/u BMP as outpatient   #Mental health disorders - Patient carries diagnoses of anxiety, depression, bipolar disorder. She is taking Lamictal, olanzapine, Cymbalta at home. No suicidal homicidal ideation.  - Continue home medications   #Macrocytic anemia - Vitamin B12 and folic acid level were normal in the past on 11/30/12. Her medications such as Lamictal and olanzapine may have partially contributed. Hgb is stable 11.8 on 04/30/12 to 10.9 today.  - F/u CBC as outpatient   #  Chronic nausea and vomiting - Unclear etiology. Per GI, Dr. Jarold Motto, patient's symptoms are most likely secondary to her severe COPD. Symptoms are controlled by Zofran and Reglan at home.  - Continue home Zofran and Reglan  #DVT PPX - Lovenox subq  Dispo: Disposition is deferred at this time, awaiting improvement of current medical problems.  Anticipated discharge in approximately 1-3 day(s).   The patient does have a current PCP Windell Hummingbird, MD) and does need an [facility] hospital follow-up appointment after discharge.  The patient does not have transportation limitations that hinder transportation to clinic appointments.  .Services Needed at time of discharge: Y = Yes, Blank = No PT:   OT:   RN:   Equipment:   Other:     LOS: 1 day   Vivi Barrack, MD 02/28/2013, 9:33 AM

## 2013-02-28 NOTE — Progress Notes (Signed)
Ambulated patient 96ft on 2.5L of O2 in hall at 1330. Patient sated 94-97% on 2.5L. At 1350 became short of breath and PRN albuterol treatment given. Notified Dr. Claudell Kyle. Will continue to monitor

## 2013-02-28 NOTE — Progress Notes (Signed)
UR completed 

## 2013-02-28 NOTE — Progress Notes (Signed)
CSW (Clinical Child psychotherapist) informed that pt needs transportation assistance and is not appropriate to take the bus. CSW spoke with pt and provided pt with taxi voucher. CSW called [employer] and informed nurse tech that taxi will be at [employer] entrance around 3:50.   Mackayla Mullins, LCSWA [phone]

## 2013-02-28 NOTE — Discharge Summary (Signed)
Name: [name] MRN: [id number] DOB: 1950-12-02 62 y.o. PCP: Windell Hummingbird, MD  Date of Admission: 02/27/2013  5:37 PM Date of Discharge: 02/28/2013 Attending Physician: Inez Catalina, MD  Discharge Diagnosis: 1. Acute COPD Exacerbation 2. Hyponatremia 3. Mental health disorders 4. Macrocytic anemia  5. Chronic nausea and vomiting  Discharge Medications:   Medication List         albuterol 108 (90 BASE) MCG/ACT inhaler  Commonly known as:  PROAIR HFA  INHALE 1 PUFF IN MOUTH EVERY2- 4 HOURS AS NEEDED FOR WHEEZING     albuterol (2.5 MG/3ML) 0.083% nebulizer solution  Commonly known as:  PROVENTIL  Take 3 mLs (2.5 mg total) by nebulization every 4 (four) hours as needed. Shortness of breath     alendronate 35 MG tablet  Commonly known as:  FOSAMAX  Take 35 mg by mouth every 7 (seven) days. Take in the morning with a full glass of water, on an empty stomach, and do not take anything else by mouth or lie down for the next 30 min.     AMBULATORY NON FORMULARY MEDICATION  Continuous O2 @@ 2LPM     azithromycin 250 MG tablet  Commonly known as:  ZITHROMAX  Take 1 tablet (250 mg total) by mouth daily.     beclomethasone 42 MCG/SPRAY nasal spray  Commonly known as:  BECONASE-AQ  Place 2 sprays into the nose 2 (two) times daily. Dose is for each nostril.     calcium carbonate 600 MG Tabs tablet  Commonly known as:  OS-CAL  Take 600 mg by mouth 2 (two) times daily.     cetirizine 5 MG tablet  Commonly known as:  ZYRTEC  Take 1 tablet (5 mg total) by mouth at bedtime as needed for rhinitis.     cholecalciferol 1000 UNITS tablet  Commonly known as:  VITAMIN D  Take 1,000 Units by mouth daily.     DULoxetine 30 MG capsule  Commonly known as:  CYMBALTA  Take 30-60 mg by mouth daily. Takes 30 mg in the morning and 60 mg in the evenings     ENSURE HIGH PROTEIN Liqd  Take 1 Can by mouth 3 (three) times daily.     esomeprazole 40 MG capsule  Commonly known as:   NEXIUM  Take 1 capsule (40 mg total) by mouth daily before breakfast.     fentaNYL 75 MCG/HR  Commonly known as:  DURAGESIC - dosed mcg/hr  Place 75 mcg onto the skin every 3 (three) days.     fexofenadine-pseudoephedrine 60-120 MG per tablet  Commonly known as:  ALLEGRA-D  Take 1 tablet by mouth 2 (two) times daily.     Fluticasone-Salmeterol 250-50 MCG/DOSE Aepb  Commonly known as:  ADVAIR  Inhale 1 puff into the lungs 2 (two) times daily.     lamoTRIgine 100 MG tablet  Commonly known as:  LAMICTAL  Take 100-200 mg by mouth 2 (two) times daily. 1 tablet in the morning and 2 tablets at night     metoCLOPramide 10 MG tablet  Commonly known as:  REGLAN  Take 1 tablet (10 mg total) by mouth at bedtime.     NUCYNTA 100 MG Tabs  Generic drug:  Tapentadol HCl  Take 100 mg by mouth 4 (four) times daily.     OLANZapine 5 MG tablet  Commonly known as:  ZYPREXA  Take 5 mg by mouth at bedtime.     omeprazole 20 MG capsule  Commonly known  as:  PRILOSEC  Take 1 capsule (20 mg total) by mouth daily.     ondansetron 4 MG tablet  Commonly known as:  ZOFRAN  Take 1 tablet (4 mg total) by mouth every 8 (eight) hours as needed for nausea.     oxyCODONE 15 MG immediate release tablet  Commonly known as:  ROXICODONE  Take 15 mg by mouth every 6 (six) hours as needed for pain.     predniSONE 50 MG tablet  Commonly known as:  DELTASONE  Take 1 tablet (50 mg total) by mouth daily with breakfast.     tiotropium 18 MCG inhalation capsule  Commonly known as:  SPIRIVA  Place 1 capsule (18 mcg total) into inhaler and inhale daily.     zolpidem 5 MG tablet  Commonly known as:  AMBIEN  Take 1 tablet (5 mg total) by mouth at bedtime as needed for sleep.        Disposition and follow-up:   [name] was discharged from [facility] in Stable condition.  At the hospital follow up visit please address:  1.  Improved SOB with exertion on new O2 regimen (2.5L at rest,  3.5L with exertion)? Compliance with respiratory medicines especially Spiriva, Zyrtec. Please continue to encourage pulmonary follow up (referral placed 12/16).  2.  Labs / imaging needed at time of follow-up: BMP, CBC  3.  Pending labs/ test needing follow-up: None  Follow-up Appointments: Follow-up Information   Follow up with Christen Bame, MD On 03/15/2013. (@1 :45pm. This is your primary care clinic.)    Specialty:  Internal Medicine   Contact information:   [address] [place] [address] [phone]       Discharge Instructions: Discharge Orders   Future Appointments Provider Department Dept Phone   03/15/2013 1:45 PM Christen Bame, MD [facility] [phone]   Future Orders Complete By Expires   Diet - low sodium heart healthy  As directed    Increase activity slowly  As directed       Consultations:  None  Procedures Performed:  Dg Chest Portable 1 View  02/27/2013   CLINICAL DATA:  Difficulty breathing  EXAM: PORTABLE CHEST - 1 VIEW  COMPARISON:  Portable exam 1837 hr compared to 05/07/2012  FINDINGS: Normal heart size, mediastinal contours, and pulmonary vascularity.  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  Mild scattered interstitial prominence at the lower lungs, likely unchanged accounting for differences in technique.  No acute infiltrate, pleural effusion or pneumothorax.  No acute osseous findings.  IMPRESSION: COPD changes.  No acute abnormalities.   Electronically Signed   By: Ulyses Southward M.D.   On: 02/27/2013 18:57    Admission HPI:  This is a 62yo WF w/ PMH of COPD (GOLD IV on 2LPM O2 at home), depression, chronic pain, bipolar disorder, chronic N/V (unknown etiology, followed by GI) who presents w/ c/o worsening of her baseline cough and SOB x 1 week. Patient also reports that her cough is productive of brown sputum at baseline, but that the amount of sputum has increased recently. Pt has been  taking her albuterol nebulizer TID, which is what she usually does, but has been taking her albuterol inhaler 4-5 times daily (usually takes this 2-3 times daily). She is complaint with her Advair, but has not been taking Spiriva for months. She was told by Dr. Dierdre Searles yesterday in clinic to continue the Spiriva, so she restarted taking  this medication yesterday. Patient does have chronic N/V that is worse in the mornings for which she has been evaluated by Dr. Jarold Motto with GI. Per his note, he thinks these symptoms are 2/2 her severe and progressive COPD. She takes reglan qHS and zofran prn for her symptoms. Pt denies F/C, CP, URI symptoms, abd pain, diarrhea, BLE edema. She lives alone and has a [facility] aide that comes to help hear each morning. No sick contacts. She was seen in clinic yesterday and noted to be at her baseline health status, though did have a T 100.6. She received the flu shot yesterday as well.  In the ED, T 98.9 (though 100.6 in clinic yesterday), BP 132/77, HR 86, RR 20, SpO2 91% O2 2LPM. BMP Na 129, Ca 7.7. CBC Hb 10.9 (baseline). POC troponin negative. CXR no acute process. Patient received albuterol/atrovent nebs, solumedrol 80mg  x 1 dose, levaquin 750mg  x 1 dose, magnesium 2g IV.   Physical Exam:  Blood pressure 131/58, pulse 91, temperature 98.9 F (37.2 C), temperature source Oral, resp. rate 13, SpO2 91.00%. on 2LPM O2 [place]  General: alert, cooperative, and in no apparent distress; speaking in complete sentences HEENT: pupils equal round and reactive to light, vision grossly intact, oropharynx clear and non-erythematous, MMM  Neck: supple, no lymphadenopathy Lungs: diffuse wheezing bilaterally w/ increased work of expiration on deep breathing Heart: regular rate and rhythm, no murmurs, gallops, or rubs Abdomen: soft, non-tender, non-distended, normal bowel sounds  Extremities: warm extremities, no BLE edema Neurologic: alert & oriented X3, cranial nerves II-XII grossly intact,  strength grossly intact, sensation intact to light touch   Hospital Course by problem list:   1. Acute COPD Exacerbation - Patient's worsening shortness of breath and productive cough were most likely caused by an acute COPD exacerbation. She has history of severe COPD, Gold Stage IV (last PFTs 08/2012). She had not been compliant with her Spiriva and Zyrtec and she continues to smoke. Symptoms improved s/p albuterol/atrovent nebs, steroids, magnesium sulfate, antibiotics. She was comfortable on discharge and saturating well on her home 2.5L. With ambulation she sated 94-97% on 2.5L, however did become subjectively short of breath at the end of the session. I have instructed the patient to increase her home O2 by a liter when she is exerting herself at home. We have discharged her on a 5-day course of azithromycin, a weeklong course of prednisone, and her home inhaler and nebulizer regimen q4h with counseling on Spiriva use. Patient was given a referral to pulmonary rehabilitation when she was last evaluated in clinic on 12/16. We feel she will really benefit from this and encouraged her to follow up. It is possible that her presentation to the hospital this time represents more of a progression of her disease than an acute exacerbation.  2. Hyponatremia - Patient has chronic intermittent hyponatremia. This problem was worked up without conclusive diagnosis in the past when she was hospitalized on 01/10/12. It seemed to have a mixed picture. Laboratory studies did not completely fit with SIADH or psychogenic polydipsia. TSH and morning cortisol were normal. Her sodium was 129 on admission, indicating very mild hyponatremia without symptoms. It is perhaps secondary to the complications from psychotic medications. F/u BMP as outpatient.  3. Mental health disorders - Patient carries diagnoses of anxiety, depression, bipolar disorder. She is taking Lamictal, olanzapine, Cymbalta at home. No suicidal homicidal  ideation. We continued her home medications.  4. Macrocytic anemia - Vitamin B12 and folic acid level were normal in  the past on 11/30/12. Her medications such as Lamictal and olanzapine may have partially contributed. Hgb was stable 11.8 on 04/30/12 to 10.9 on admission. F/u CBC as outpatient.  5. Chronic nausea and vomiting - Unclear etiology. Per GI, Dr. Jarold Motto, patient's symptoms are most likely secondary to her severe COPD. Symptoms are controlled by Zofran and Reglan at home. We continued these.   Discharge Vitals:   BP 142/61  Pulse 102  Temp(Src) 99 F (37.2 C) (Oral)  Resp 18  SpO2 97%  Discharge Labs:  Results for orders placed during the hospital encounter of 02/27/13 (from the past 24 hour(s))  BASIC METABOLIC PANEL     Status: Abnormal   Collection Time    02/27/13  5:25 PM      Result Value Range   Sodium 129 (*) 135 - 145 mEq/L   Potassium 4.1  3.5 - 5.1 mEq/L   Chloride 90 (*) 96 - 112 mEq/L   CO2 29  19 - 32 mEq/L   Glucose, Bld 99  70 - 99 mg/dL   BUN 7  6 - 23 mg/dL   Creatinine, Ser 6.96 (*) 0.50 - 1.10 mg/dL   Calcium 7.7 (*) 8.4 - 10.5 mg/dL   GFR calc non Af Amer >90  >90 mL/min   GFR calc Af Amer >90  >90 mL/min  CBC     Status: Abnormal   Collection Time    02/27/13  5:25 PM      Result Value Range   WBC 6.9  4.0 - 10.5 K/uL   RBC 3.09 (*) 3.87 - 5.11 MIL/uL   Hemoglobin 10.9 (*) 12.0 - 15.0 g/dL   HCT 29.5 (*) 28.4 - 13.2 %   MCV 104.2 (*) 78.0 - 100.0 fL   MCH 35.3 (*) 26.0 - 34.0 pg   MCHC 33.9  30.0 - 36.0 g/dL   RDW 44.0  10.2 - 72.5 %   Platelets 330  150 - 400 K/uL  POCT I-STAT TROPONIN I     Status: None   Collection Time    02/27/13  6:07 PM      Result Value Range   Troponin i, poc 0.00  0.00 - 0.08 ng/mL   Comment 3             Signed: Vivi Barrack, MD 02/28/2013, 3:39 PM   Time Spent on Discharge: 40 minutes Services Ordered on Discharge: None Equipment Ordered on Discharge: None

## 2013-02-28 NOTE — H&P (Signed)
  Date: 02/28/2013  Patient name: [name]  Medical record number: [id number]  Date of birth: 1950/05/15   I have seen and evaluated [name] and discussed their care with the Residency Team.  I saw [name] with the resident team.  She noted to me that her main symptom was increased SOB with activity.  She also presented with worsening cough and SOB X [redacted] week along with increased sputum production.  She has been using her albuterol inhaler more often.  She has apparently not been taking her spiriva at home.  She was seen in clinic yesterday and noted to have a temp of 100.6, but does not complain of fever or systemic symptoms.  In the ED, she was saturating 91% on [facility] which is her home dose.  She received steroids, nebs and 1 dose of levaquin.   Assessment and Plan: I have seen and evaluated the patient as outlined above. I agree with the formulated Assessment and Plan as detailed in the residents' admission note, with the following changes:   1. COPD exacerbation: This is the most likely explanation for her symptoms, however, progressive worsening of her COPD could be to blame as well.  She will receive scheduled nebulizers, steroids with PO prednisone, azithromycin for 5 days and cough medicine.  She will remain on her O2, increase as needed to keep sats around 92%.  Would ask nursing to walk her with Oxygen to see if she requires higher doses with activity now.  Will also encourage her to take her spiriva at home.  She would benefit from pulmonary rehab.   Her other issues are chronic and stable and discussed in the resident note.  She may be able to be discharged home relatively quickly, which she has expressed interest in.   Inez Catalina, MD 12/18/20149:33 AM

## 2013-02-28 NOTE — Progress Notes (Signed)
Patient discharged to home. Discharge instructions and rx given and explained and patient stated understanding. Patients IV was removed and she left unit in a stable condition via wheelchair.

## 2013-03-01 ENCOUNTER — Other Ambulatory Visit: Payer: Self-pay

## 2013-03-01 ENCOUNTER — Encounter (HOSPITAL_COMMUNITY): Payer: Self-pay | Admitting: Emergency Medicine

## 2013-03-01 ENCOUNTER — Emergency Department (HOSPITAL_COMMUNITY)
Admission: EM | Admit: 2013-03-01 | Discharge: 2013-03-01 | Disposition: A | Discharge status: Home / Self Care | Payer: Medicare Other | Attending: Emergency Medicine | Admitting: Emergency Medicine

## 2013-03-01 DIAGNOSIS — J449 Chronic obstructive pulmonary disease, unspecified: Secondary | ICD-10-CM

## 2013-03-01 DIAGNOSIS — F411 Generalized anxiety disorder: Secondary | ICD-10-CM | POA: Insufficient documentation

## 2013-03-01 DIAGNOSIS — Z8739 Personal history of other diseases of the musculoskeletal system and connective tissue: Secondary | ICD-10-CM | POA: Insufficient documentation

## 2013-03-01 DIAGNOSIS — F319 Bipolar disorder, unspecified: Secondary | ICD-10-CM | POA: Insufficient documentation

## 2013-03-01 DIAGNOSIS — Z792 Long term (current) use of antibiotics: Secondary | ICD-10-CM | POA: Insufficient documentation

## 2013-03-01 DIAGNOSIS — R0609 Other forms of dyspnea: Secondary | ICD-10-CM

## 2013-03-01 DIAGNOSIS — I10 Essential (primary) hypertension: Secondary | ICD-10-CM | POA: Insufficient documentation

## 2013-03-01 DIAGNOSIS — G8929 Other chronic pain: Secondary | ICD-10-CM | POA: Insufficient documentation

## 2013-03-01 DIAGNOSIS — Z862 Personal history of diseases of the blood and blood-forming organs and certain disorders involving the immune mechanism: Secondary | ICD-10-CM | POA: Insufficient documentation

## 2013-03-01 DIAGNOSIS — Z9981 Dependence on supplemental oxygen: Secondary | ICD-10-CM | POA: Insufficient documentation

## 2013-03-01 DIAGNOSIS — Z8639 Personal history of other endocrine, nutritional and metabolic disease: Secondary | ICD-10-CM | POA: Insufficient documentation

## 2013-03-01 DIAGNOSIS — J441 Chronic obstructive pulmonary disease with (acute) exacerbation: Secondary | ICD-10-CM | POA: Insufficient documentation

## 2013-03-01 DIAGNOSIS — R0600 Dyspnea, unspecified: Secondary | ICD-10-CM

## 2013-03-01 DIAGNOSIS — F17200 Nicotine dependence, unspecified, uncomplicated: Secondary | ICD-10-CM | POA: Insufficient documentation

## 2013-03-01 DIAGNOSIS — IMO0002 Reserved for concepts with insufficient information to code with codable children: Secondary | ICD-10-CM | POA: Insufficient documentation

## 2013-03-01 DIAGNOSIS — Z79899 Other long term (current) drug therapy: Secondary | ICD-10-CM | POA: Insufficient documentation

## 2013-03-01 MED ORDER — OXYCODONE HCL 5 MG PO TABS
15.0000 mg | ORAL_TABLET | Freq: Once | ORAL | Status: AC
  Administered 2013-03-01: 15 mg via ORAL
  Filled 2013-03-01: qty 3

## 2013-03-01 MED ORDER — ALBUTEROL SULFATE (5 MG/ML) 0.5% IN NEBU
5.0000 mg | INHALATION_SOLUTION | Freq: Once | RESPIRATORY_TRACT | Status: AC
  Administered 2013-03-01: 5 mg via RESPIRATORY_TRACT
  Filled 2013-03-01: qty 1

## 2013-03-01 MED ORDER — ALPRAZOLAM 0.25 MG PO TABS: 0.2500 mg | ORAL_TABLET | Freq: Two times a day (BID) | ORAL | Status: DC | PRN

## 2013-03-01 MED ORDER — ALPRAZOLAM 0.5 MG PO TABS
0.2500 mg | ORAL_TABLET | Freq: Once | ORAL | Status: AC
  Administered 2013-03-01: 0.25 mg via ORAL
  Filled 2013-03-01: qty 1

## 2013-03-01 NOTE — ED Notes (Signed)
Pt waiting in room for ride due to being O2 dependent

## 2013-03-01 NOTE — ED Notes (Signed)
Friend called for ride.

## 2013-03-01 NOTE — ED Notes (Signed)
Contact information for friend, Dorene Grebe [phone] office number, [phone] cell number

## 2013-03-01 NOTE — Consult Note (Signed)
  Date: 03/01/2013  Patient name: [name]  Medical record number: [id number]  Date of birth: 1950/09/19   I have seen and evaluated [name] and discussed their care with the Residency Team.  I evaluated [name] with the medicine team.  She was noted to be at about her baseline from when she was discharged and her pulmonary exam was actually improved with decreased wheezing.  She does have an increased I:E ratio with pursing of lips, this is baseline as well.  She presented because she was concerned about being home alone as she was having acute episodes of air hunger that did improve with nebulization.  She has been taking her steroids as prescribed.  She has been referred to pulmonary rehab, which will likely be of use to her.  She is treated also for chronic pain with fentanyl and oxycodone.  She is likely not receiving much relief from the fentanyl patch as she has very little subQ fat.    Assessment and Plan: I have seen and evaluated the patient as outlined above. I agree with the formulated Assessment and Plan as detailed in the residents' admission note, with the following changes:   1. Air hunger in the setting of severe COPD: I agree with the team that this is likely not a true exacerbation but more related to progression of her disease.  Her labs are not significantly changed from previous and she has no leukocytosis.  A CXR showed no acute abnormalities.  We will give her her pain medication and trial of low dose xanax for anxiety.  If this improves her symptoms, we will let her go home from the ED.  We have encouraged her to seek out ALF or SNF as monitoring and close supervision may be preferential for her acute needs, however, she is not overly interested in these options.   UPDATE: Eval about 1 hour after receiving therapy, [name] was improved and did think she could go home.  She was somewhat sleepy from the medications, but she has a friend who can pick her up.  We  will discharge her from the ED at this time.   Inez Catalina, MD 12/19/20143:39 PM

## 2013-03-01 NOTE — ED Notes (Signed)
Internal medicine at bedside to discuss patient symptoms and condition, decide on possible admission.

## 2013-03-01 NOTE — ED Notes (Signed)
Pt c/o increased SOB and chronic pain; pt noted to have 2 fentanyl patches on; pt on home O2 at 3L and cough noted

## 2013-03-01 NOTE — Consult Note (Signed)
Date: 03/01/2013               Patient Name:  [name] MRN: [id number]  DOB: Dec 09, 1950 Age / Sex: 62 y.o., female   PCP: Windell Hummingbird, MD         Requesting Physician: Dr. Bonnita Levan. Bernette Mayers, MD    Consulting Reason:  Shortness of breath and concern for unsafe discharge     Chief Complaint: Shortness of breath  History of Present Illness:  [name] is a 62 y.o. 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  [name] was admitted to the hospital on 12/17 for COPD exacerbation. She had not been compliant with her Spiriva and Zyrtec at home, and she continues to smoke. Symptoms improved s/p albuterol/atrovent nebs, steroids, magnesium sulfate, antibiotics. She was comfortable on discharge yesterday, and saturating well on her home 2.5-3L. With ambulation she sated 94-97% on 2.5L, however she did become subjectively short of breath and anxious at the end of the session with no desaturation. Because of this, I instructed the patient to increase her home O2 by a liter when she was exerting herself at home. We discharged her on a 5-day course of azithromycin, a weeklong course of prednisone, and her home inhaler and nebulizer regimen q4h with counseling on Spiriva use. Patient was given a referral to pulmonary rehabilitation when she was last evaluated in clinic on 12/16. We felt that her presentation to the hospital may have represented more of a progression of her disease than an acute exacerbation, and that pulmonary rehabilitation would be of the upmost important going forward.  Today her appearance is stable. She is comfortable, speaking in complete sentences, not using accessory muscles. She describes an episode of anxiety and air hunger at home, which led to her presentation to the ED but has now resolved s/p albuterol  nebulization. She lives alone and has a [facility] aide that comes to help her each morning. Her sister is coming in for the holidays but won't be here until tomorrow. She is absolutely against SNF placement but willing to learn more about ALF and discuss it in the future. She is requesting a pain pill.   Meds: No current facility-administered medications for this encounter.   Current Outpatient Prescriptions  Medication Sig Dispense Refill  . albuterol (PROAIR HFA) 108 (90 BASE) MCG/ACT inhaler INHALE 1 PUFF IN MOUTH EVERY2- 4 HOURS AS NEEDED FOR WHEEZING  18 g  1  . albuterol (PROVENTIL) (2.5 MG/3ML) 0.083% nebulizer solution Take 3 mLs (2.5 mg total) by nebulization every 4 (four) hours as needed. Shortness of breath  75 mL  12  . alendronate (FOSAMAX) 35 MG tablet Take 35 mg by mouth every 7 (seven) days. Take in the morning with a full glass of water, on an empty stomach, and do not take anything else by mouth or lie down for the next 30 min.      Marland Kitchen AMBULATORY NON FORMULARY MEDICATION Continuous O2 @@ 2LPM      . azithromycin (ZITHROMAX) 250 MG tablet Take 1 tablet (250 mg total) by mouth daily.  5 tablet  0  . cetirizine (ZYRTEC) 5 MG tablet Take 1 tablet (5 mg total) by mouth at bedtime as needed for rhinitis.  30 tablet  0  . cholecalciferol (VITAMIN D) 1000 UNITS tablet Take 1,000  Units by mouth daily.      . DULoxetine (CYMBALTA) 30 MG capsule Take 30-60 mg by mouth daily. Takes 30 mg in the morning and 60 mg in the evenings      . fentaNYL (DURAGESIC - DOSED MCG/HR) 75 MCG/HR Place 75 mcg onto the skin every 3 (three) days.      . Fluticasone-Salmeterol (ADVAIR) 250-50 MCG/DOSE AEPB Inhale 1 puff into the lungs 2 (two) times daily.  60 each  11  . lamoTRIgine (LAMICTAL) 100 MG tablet Take 100-200 mg by mouth 2 (two) times daily. 1 tablet in the morning and 2 tablets at night      . metoCLOPramide (REGLAN) 10 MG tablet Take 1 tablet (10 mg total) by mouth at bedtime.  30 tablet  6  .  Nutritional Supplements (ENSURE HIGH PROTEIN) LIQD Take 1 Can by mouth 3 (three) times daily.  90 Can  2  . OLANZapine (ZYPREXA) 5 MG tablet Take 5 mg by mouth at bedtime.      Marland Kitchen omeprazole (PRILOSEC) 20 MG capsule Take 1 capsule (20 mg total) by mouth daily.  30 capsule  3  . ondansetron (ZOFRAN) 4 MG tablet Take 1 tablet (4 mg total) by mouth every 8 (eight) hours as needed for nausea.  20 tablet  0  . oxyCODONE (ROXICODONE) 15 MG immediate release tablet Take 15 mg by mouth every 6 (six) hours as needed for pain.      . predniSONE (DELTASONE) 50 MG tablet Take 1 tablet (50 mg total) by mouth daily with breakfast.  7 tablet  0  . Tapentadol HCl (NUCYNTA) 100 MG TABS Take 100 mg by mouth 4 (four) times daily.      Marland Kitchen tiotropium (SPIRIVA) 18 MCG inhalation capsule Place 1 capsule (18 mcg total) into inhaler and inhale daily.  32 capsule  11  . zolpidem (AMBIEN) 5 MG tablet Take 1 tablet (5 mg total) by mouth at bedtime as needed for sleep.  30 tablet  0    Allergies: Allergies as of 03/01/2013 - Review Complete 03/01/2013  Allergen Reaction Noted  . Banana Nausea And Vomiting 01/10/2012  . Pollen extract  11/20/2012   Past Medical History  Diagnosis Date  . Depression   . Hypertension   . Suicidal ideation 2007     attempted overdose 2012  Dr. Gwendalyn Ege report  . Bipolar disorder   . Substance abuse      narcotics, alcohol, tobacco  . Chronic pain syndrome     follows at pain managment  . DEFICIENCY, VITAMIN D NOS 01/03/2007  . Chronic lower back pain   . Anxiety   . OSTEOPOROSIS 06/17/2009    DEXA 05/2009 : L femur -2.9; R femur -2.5. Alendronate on med list but not taking. Needs addressed ASAP as h/o fractures.    . Sciatic pain   . Anemia   . Elevated liver function tests   . COPD (chronic obstructive pulmonary disease)   . On home oxygen therapy     "2L; 24/7" (02/27/2013)  . Shortness of breath     "all the time" (02/27/2013)  . Arthritis     "legs, back; hips, primarily  left hip" (02/27/2013)   Past Surgical History  Procedure Laterality Date  . Orif ankle fracture Left 11/16/2010  . Cesarean section  1974; 1979  . Tubal ligation  1979  . Augmentation mammaplasty  ~ 2007   Family History  Problem Relation Age of Onset  . Stroke Neg Hx   .  Cancer Neg Hx    History   Social History  . Marital Status: Single    Spouse Name: N/A    Number of Children: 2  . Years of Education: N/A   Occupational History  . retired    Social History Main Topics  . Smoking status: Current Every Day Smoker -- 0.20 packs/day for 44 years    Types: Cigarettes  . Smokeless tobacco: Never Used     Comment: 02/27/2013 "smoking 2 cigarettes/day; that's down from 2 ppd"  . Alcohol Use: Yes     Comment: 02/27/2013 "nothing in 6 years; never had a problem w/it"  . Drug Use: No  . Sexual Activity: No   Other Topics Concern  . Not on file   Social History Narrative  . No narrative on file    Review of Systems: Pertinent items are noted in HPI.  Physical Exam: Blood pressure 132/79, pulse 86, temperature 98.8 F (37.1 C), temperature source Oral, resp. rate 22, SpO2 96.00%. Physical Exam  Constitutional: She is oriented to person, place, and time and well-developed, well-nourished, and in no distress. She appears cachectic.  HENT:  Head: Normocephalic and atraumatic.  Eyes: Conjunctivae and EOM are normal. Pupils are equal, round, and reactive to light.  Neck: Normal range of motion. Neck supple.  Cardiovascular: Normal rate, regular rhythm, normal heart sounds and intact distal pulses.  Exam reveals no gallop and no friction rub.   No murmur heard. Pulmonary/Chest: Effort normal. No accessory muscle usage or stridor. Not tachypneic. No respiratory distress. She has wheezes (Mild, diffuse). She has no rales. She exhibits no tenderness.  Abdominal: Soft. She exhibits no distension. There is no tenderness.  Musculoskeletal: Normal range of motion. She exhibits no  edema and no tenderness.  Neurological: She is alert and oriented to person, place, and time. GCS score is 15.  Skin: Skin is warm and dry. She is not diaphoretic.  Psychiatric: Affect normal.  A little somnolent.     Lab results: Basic Metabolic Panel:  Recent Labs  29/56/21 1725  NA 129*  K 4.1  CL 90*  CO2 29  GLUCOSE 99  BUN 7  CREATININE 0.33*  CALCIUM 7.7*   Liver Function Tests: No results found for this basename: AST, ALT, ALKPHOS, BILITOT, PROT, ALBUMIN,  in the last 72 hours No results found for this basename: LIPASE, AMYLASE,  in the last 72 hours No results found for this basename: AMMONIA,  in the last 72 hours CBC:  Recent Labs  02/27/13 1725  WBC 6.9  HGB 10.9*  HCT 32.2*  MCV 104.2*  PLT 330   Cardiac Enzymes: No results found for this basename: CKTOTAL, CKMB, CKMBINDEX, TROPONINI,  in the last 72 hours BNP: No results found for this basename: PROBNP,  in the last 72 hours D-Dimer: No results found for this basename: DDIMER,  in the last 72 hours CBG: No results found for this basename: GLUCAP,  in the last 72 hours Hemoglobin A1C: No results found for this basename: HGBA1C,  in the last 72 hours Fasting Lipid Panel: No results found for this basename: CHOL, HDL, LDLCALC, TRIG, CHOLHDL, LDLDIRECT,  in the last 72 hours Thyroid Function Tests: No results found for this basename: TSH, T4TOTAL, FREET4, T3FREE, THYROIDAB,  in the last 72 hours Anemia Panel: No results found for this basename: VITAMINB12, FOLATE, FERRITIN, TIBC, IRON, RETICCTPCT,  in the last 72 hours Coagulation: No results found for this basename: LABPROT, INR,  in the last  72 hours Urine Drug Screen: Drugs of Abuse     Component Value Date/Time   LABOPIA NONE DETECTED 01/10/2012 1339   COCAINSCRNUR NONE DETECTED 01/10/2012 1339   LABBENZ NONE DETECTED 01/10/2012 1339   AMPHETMU NONE DETECTED 01/10/2012 1339   THCU NONE DETECTED 01/10/2012 1339   LABBARB NONE DETECTED  01/10/2012 1339    Alcohol Level: No results found for this basename: ETH,  in the last 72 hours Urinalysis: No results found for this basename: COLORURINE, APPERANCEUR, LABSPEC, PHURINE, GLUCOSEU, HGBUR, BILIRUBINUR, KETONESUR, PROTEINUR, UROBILINOGEN, NITRITE, LEUKOCYTESUR,  in the last 72 hours   Imaging results:  Dg Chest Portable 1 View  02/27/2013   CLINICAL DATA:  Difficulty breathing  EXAM: PORTABLE CHEST - 1 VIEW  COMPARISON:  Portable exam 1837 hr compared to 05/07/2012  FINDINGS: Normal heart size, mediastinal contours, and pulmonary vascularity.  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  Mild scattered interstitial prominence at the lower lungs, likely unchanged accounting for differences in technique.  No acute infiltrate, pleural effusion or pneumothorax.  No acute osseous findings.  IMPRESSION: COPD changes.  No acute abnormalities.   Electronically Signed   By: Ulyses Southward M.D.   On: 02/27/2013 18:57    Other results: EKG: normal EKG, normal sinus rhythm, unchanged from previous tracings.  Assessment, Plan, & Recommendations by Problem: [name] is a 62 y.o. 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  #Shortness of breath - Patient is recovering from a COPD exacerbation. She has history of severe COPD, Gold Stage IV (last PFTs 08/2012). She is comfortable today and saturating well on her home 3L, but complaining of disturbing episodes of shortness of breath at home. Suspect air hunger and anxiety are feeding into this. Will treat with home oxycodone and a small dose of Xanax, and re-evaluate in a few hours. This will likely continue to be a chronic problem for her moving forward as her COPD is quite severe. She lives alone and would benefit from ALF palcement, but she is still  contemplating the decision.  - Home oxycodone 15mg  for air hunger and pain - Xanax 0.25mg  for anxiety - Nebulizers: DuoNebs every 4 hours, plus albuterol every 2 hours, prn  - Continue Oral azithromycin for 5 days  - Continue Oral prednisone 50 mg daily  - Continue O2 therapy - keep O2 sat >92%  - If still stable and subjective air hunger and anxiety have improved on re-evaluation, medically stable for discharge home with a xanax prescription and planned follow up in the clinic on Jan. 2 and with pulmonary rehabilitation   Dispo: Disposition is deferred at this time, awaiting improvement of current medical problems. Anticipated discharge in approximately 0-1 day(s).   The patient does have a current PCP Windell Hummingbird, MD) and does need an [facility] hospital follow-up appointment after discharge.  The patient does not have transportation limitations that hinder transportation to clinic appointments.  Signed: Vivi Barrack, MD 03/01/2013, 2:18 PM

## 2013-03-01 NOTE — ED Provider Notes (Signed)
CSN: [id number]     Arrival date & time 03/01/13  1105 History   First MD Initiated Contact with Patient 03/01/13 1121     Chief Complaint  Patient presents with  . Shortness of Breath   (Consider location/radiation/quality/duration/timing/severity/associated sxs/prior Treatment) Patient is a 62 y.o. female presenting with shortness of breath.  Shortness of Breath  Pt with history of COPD as well as anxiety presents from home with home health aid and her supervisor with reports of multiple SOB episodes this morning which scared her. She states she has been using inhaler and nebs at home as directed. She was admitted for same 2 days ago and discharged yesterday. She is concerned because she lives alone and will not have any help at home during the holidays. Her sister is scheduled to come to town tomorrow however.   Past Medical History  Diagnosis Date  . Depression   . Hypertension   . Suicidal ideation 2007     attempted overdose 2012  Dr. Gwendalyn Ege report  . Bipolar disorder   . Substance abuse      narcotics, alcohol, tobacco  . Chronic pain syndrome     follows at pain managment  . DEFICIENCY, VITAMIN D NOS 01/03/2007  . Chronic lower back pain   . Anxiety   . OSTEOPOROSIS 06/17/2009    DEXA 05/2009 : L femur -2.9; R femur -2.5. Alendronate on med list but not taking. Needs addressed ASAP as h/o fractures.    . Sciatic pain   . Anemia   . Elevated liver function tests   . COPD (chronic obstructive pulmonary disease)   . On home oxygen therapy     "2L; 24/7" (02/27/2013)  . Shortness of breath     "all the time" (02/27/2013)  . Arthritis     "legs, back; hips, primarily left hip" (02/27/2013)   Past Surgical History  Procedure Laterality Date  . Orif ankle fracture Left 11/16/2010  . Cesarean section  1974; 1979  . Tubal ligation  1979  . Augmentation mammaplasty  ~ 2007   Family History  Problem Relation Age of Onset  . Stroke Neg Hx   . Cancer Neg Hx    History   Substance Use Topics  . Smoking status: Current Every Day Smoker -- 0.20 packs/day for 44 years    Types: Cigarettes  . Smokeless tobacco: Never Used     Comment: 02/27/2013 "smoking 2 cigarettes/day; that's down from 2 ppd"  . Alcohol Use: Yes     Comment: 02/27/2013 "nothing in 6 years; never had a problem w/it"   OB History   Grav Para Term Preterm Abortions TAB SAB Ect Mult Living                 Review of Systems  Respiratory: Positive for shortness of breath.    All other systems reviewed and are negative except as noted in HPI.   Allergies  Banana and Pollen extract  Home Medications   Current Outpatient Rx  Name  Route  Sig  Dispense  Refill  . albuterol (PROAIR HFA) 108 (90 BASE) MCG/ACT inhaler      INHALE 1 PUFF IN MOUTH EVERY2- 4 HOURS AS NEEDED FOR WHEEZING   18 g   1   . albuterol (PROVENTIL) (2.5 MG/3ML) 0.083% nebulizer solution   Nebulization   Take 3 mLs (2.5 mg total) by nebulization every 4 (four) hours as needed. Shortness of breath   75 mL   12   .  alendronate (FOSAMAX) 35 MG tablet   Oral   Take 35 mg by mouth every 7 (seven) days. Take in the morning with a full glass of water, on an empty stomach, and do not take anything else by mouth or lie down for the next 30 min.         Marland Kitchen AMBULATORY NON FORMULARY MEDICATION      Continuous O2 @@ 2LPM         . azithromycin (ZITHROMAX) 250 MG tablet   Oral   Take 1 tablet (250 mg total) by mouth daily.   5 tablet   0   . cetirizine (ZYRTEC) 5 MG tablet   Oral   Take 1 tablet (5 mg total) by mouth at bedtime as needed for rhinitis.   30 tablet   0   . cholecalciferol (VITAMIN D) 1000 UNITS tablet   Oral   Take 1,000 Units by mouth daily.         . DULoxetine (CYMBALTA) 30 MG capsule   Oral   Take 30-60 mg by mouth daily. Takes 30 mg in the morning and 60 mg in the evenings         . fentaNYL (DURAGESIC - DOSED MCG/HR) 75 MCG/HR   Transdermal   Place 75 mcg onto the skin every 3  (three) days.         . Fluticasone-Salmeterol (ADVAIR) 250-50 MCG/DOSE AEPB   Inhalation   Inhale 1 puff into the lungs 2 (two) times daily.   60 each   11   . lamoTRIgine (LAMICTAL) 100 MG tablet   Oral   Take 100-200 mg by mouth 2 (two) times daily. 1 tablet in the morning and 2 tablets at night         . metoCLOPramide (REGLAN) 10 MG tablet   Oral   Take 1 tablet (10 mg total) by mouth at bedtime.   30 tablet   6   . Nutritional Supplements (ENSURE HIGH PROTEIN) LIQD   Oral   Take 1 Can by mouth 3 (three) times daily.   90 Can   2   . OLANZapine (ZYPREXA) 5 MG tablet   Oral   Take 5 mg by mouth at bedtime.         Marland Kitchen omeprazole (PRILOSEC) 20 MG capsule   Oral   Take 1 capsule (20 mg total) by mouth daily.   30 capsule   3   . ondansetron (ZOFRAN) 4 MG tablet   Oral   Take 1 tablet (4 mg total) by mouth every 8 (eight) hours as needed for nausea.   20 tablet   0   . oxyCODONE (ROXICODONE) 15 MG immediate release tablet   Oral   Take 15 mg by mouth every 6 (six) hours as needed for pain.         . predniSONE (DELTASONE) 50 MG tablet   Oral   Take 1 tablet (50 mg total) by mouth daily with breakfast.   7 tablet   0   . Tapentadol HCl (NUCYNTA) 100 MG TABS   Oral   Take 100 mg by mouth 4 (four) times daily.         Marland Kitchen tiotropium (SPIRIVA) 18 MCG inhalation capsule   Inhalation   Place 1 capsule (18 mcg total) into inhaler and inhale daily.   32 capsule   11   . zolpidem (AMBIEN) 5 MG tablet   Oral   Take 1 tablet (5 mg total) by  mouth at bedtime as needed for sleep.   30 tablet   0     Prescribed by dr Larey Dresser by mental health    BP 105/63  Pulse 92  Temp(Src) 98.8 F (37.1 C) (Oral)  Resp 22  SpO2 96% Physical Exam  Nursing note and vitals reviewed. Constitutional: She is oriented to person, place, and time. She appears well-developed and well-nourished.  HENT:  Head: Normocephalic and atraumatic.  Eyes: EOM are normal. Pupils  are equal, round, and reactive to light.  Neck: Normal range of motion. Neck supple.  Cardiovascular: Normal rate, normal heart sounds and intact distal pulses.   Pulmonary/Chest: Effort normal. No respiratory distress. She has wheezes (mild end expiratory wheeze, moderate volitional upper airway wheezing).  Abdominal: Bowel sounds are normal. She exhibits no distension. There is no tenderness.  Musculoskeletal: Normal range of motion. She exhibits no edema and no tenderness.  Neurological: She is alert and oriented to person, place, and time. She has normal strength. No cranial nerve deficit or sensory deficit.  Skin: Skin is warm and dry. No rash noted.  Psychiatric: She has a normal mood and affect.    ED Course  Procedures (including critical care time) Labs Review Labs Reviewed - No data to display Imaging Review Dg Chest Portable 1 View  02/27/2013   CLINICAL DATA:  Difficulty breathing  EXAM: PORTABLE CHEST - 1 VIEW  COMPARISON:  Portable exam 1837 hr compared to 05/07/2012  FINDINGS: Normal heart size, mediastinal contours, and pulmonary vascularity.  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  Mild scattered interstitial prominence at the lower lungs, likely unchanged accounting for differences in technique.  No acute infiltrate, pleural effusion or pneumothorax.  No acute osseous findings.  IMPRESSION: COPD changes.  No acute abnormalities.   Electronically Signed   By: Ulyses Southward M.D.   On: 02/27/2013 18:57    EKG Interpretation   None       MDM   1. Dyspnea     Pt is well appearing, not in any distress with normal SpO2. I will discuss with [facility] Resident on call regarding treatment options for the patient.    Charles B. Bernette Mayers, MD 03/01/13 [phone]

## 2013-03-01 NOTE — ED Notes (Signed)
PT ambulated with baseline gait; VSS; A&Ox3; no signs of distress; respirations even and unlabored; skin warm and dry; no questions upon discharge.  

## 2013-03-02 ENCOUNTER — Inpatient Hospital Stay (HOSPITAL_COMMUNITY)
Admission: EM | Admit: 2013-03-02 | Discharge: 2013-03-05 | DRG: 190 | Disposition: A | Discharge status: Skilled Nursing Facility | Payer: Medicare Other | Attending: Internal Medicine | Admitting: Internal Medicine

## 2013-03-02 ENCOUNTER — Emergency Department (HOSPITAL_COMMUNITY): Payer: Medicare Other

## 2013-03-02 ENCOUNTER — Encounter (HOSPITAL_COMMUNITY): Payer: Self-pay | Admitting: Emergency Medicine

## 2013-03-02 DIAGNOSIS — Z9851 Tubal ligation status: Secondary | ICD-10-CM

## 2013-03-02 DIAGNOSIS — J441 Chronic obstructive pulmonary disease with (acute) exacerbation: Principal | ICD-10-CM

## 2013-03-02 DIAGNOSIS — I10 Essential (primary) hypertension: Secondary | ICD-10-CM | POA: Diagnosis present

## 2013-03-02 DIAGNOSIS — R64 Cachexia: Secondary | ICD-10-CM

## 2013-03-02 DIAGNOSIS — R0602 Shortness of breath: Secondary | ICD-10-CM | POA: Diagnosis present

## 2013-03-02 DIAGNOSIS — J9610 Chronic respiratory failure, unspecified whether with hypoxia or hypercapnia: Secondary | ICD-10-CM | POA: Diagnosis present

## 2013-03-02 DIAGNOSIS — G894 Chronic pain syndrome: Secondary | ICD-10-CM

## 2013-03-02 DIAGNOSIS — Z9981 Dependence on supplemental oxygen: Secondary | ICD-10-CM

## 2013-03-02 DIAGNOSIS — F1110 Opioid abuse, uncomplicated: Secondary | ICD-10-CM | POA: Diagnosis present

## 2013-03-02 DIAGNOSIS — E43 Unspecified severe protein-calorie malnutrition: Secondary | ICD-10-CM | POA: Diagnosis present

## 2013-03-02 DIAGNOSIS — F17200 Nicotine dependence, unspecified, uncomplicated: Secondary | ICD-10-CM | POA: Diagnosis present

## 2013-03-02 DIAGNOSIS — M810 Age-related osteoporosis without current pathological fracture: Secondary | ICD-10-CM | POA: Diagnosis present

## 2013-03-02 DIAGNOSIS — R0600 Dyspnea, unspecified: Secondary | ICD-10-CM

## 2013-03-02 DIAGNOSIS — IMO0002 Reserved for concepts with insufficient information to code with codable children: Secondary | ICD-10-CM

## 2013-03-02 DIAGNOSIS — Z79899 Other long term (current) drug therapy: Secondary | ICD-10-CM

## 2013-03-02 DIAGNOSIS — G8929 Other chronic pain: Secondary | ICD-10-CM | POA: Diagnosis present

## 2013-03-02 DIAGNOSIS — R062 Wheezing: Secondary | ICD-10-CM

## 2013-03-02 DIAGNOSIS — F3130 Bipolar disorder, current episode depressed, mild or moderate severity, unspecified: Secondary | ICD-10-CM | POA: Diagnosis present

## 2013-03-02 DIAGNOSIS — F411 Generalized anxiety disorder: Secondary | ICD-10-CM | POA: Diagnosis present

## 2013-03-02 DIAGNOSIS — D539 Nutritional anemia, unspecified: Secondary | ICD-10-CM | POA: Diagnosis present

## 2013-03-02 DIAGNOSIS — J439 Emphysema, unspecified: Secondary | ICD-10-CM | POA: Diagnosis present

## 2013-03-02 DIAGNOSIS — R1115 Cyclical vomiting syndrome unrelated to migraine: Secondary | ICD-10-CM | POA: Diagnosis present

## 2013-03-02 DIAGNOSIS — E871 Hypo-osmolality and hyponatremia: Secondary | ICD-10-CM | POA: Diagnosis present

## 2013-03-02 LAB — COMPREHENSIVE METABOLIC PANEL
AST: 27 U/L (ref 0–37)
Albumin: 3.8 g/dL (ref 3.5–5.2)
Calcium: 8.3 mg/dL — ABNORMAL LOW (ref 8.4–10.5)
Chloride: 94 mEq/L — ABNORMAL LOW (ref 96–112)
Creatinine, Ser: 0.34 mg/dL — ABNORMAL LOW (ref 0.50–1.10)
GFR calc non Af Amer: >90 mL/min (ref >90)
Glucose, Bld: 105 mg/dL — ABNORMAL HIGH (ref 70–99)
Total Bilirubin: 0.1 mg/dL — ABNORMAL LOW (ref 0.3–1.2)
Total Protein: 6.7 g/dL (ref 6.0–8.3)

## 2013-03-02 LAB — CBC
Hemoglobin: 11.3 g/dL — ABNORMAL LOW (ref 12.0–15.0)
MCH: 34.9 pg — ABNORMAL HIGH (ref 26.0–34.0)
MCHC: 33.3 g/dL (ref 30.0–36.0)
MCV: 104.6 fL — ABNORMAL HIGH (ref 78.0–100.0)
Platelets: 341 10*3/uL (ref 150–400)
RDW: 11.9 % (ref 11.5–15.5)
WBC: 11.6 10*3/uL — ABNORMAL HIGH (ref 4.0–10.5)

## 2013-03-02 MED ORDER — IPRATROPIUM BROMIDE 0.02 % IN SOLN
0.5000 mg | Freq: Once | RESPIRATORY_TRACT | Status: AC
  Administered 2013-03-02: 0.5 mg via RESPIRATORY_TRACT
  Filled 2013-03-02: qty 2.5

## 2013-03-02 MED ORDER — ALBUTEROL SULFATE (5 MG/ML) 0.5% IN NEBU
2.5000 mg | INHALATION_SOLUTION | Freq: Four times a day (QID) | RESPIRATORY_TRACT | Status: DC | PRN
  Administered 2013-03-02 – 2013-03-03 (×3): 2.5 mg via RESPIRATORY_TRACT
  Filled 2013-03-02 (×3): qty 0.5

## 2013-03-02 MED ORDER — ALBUTEROL SULFATE HFA 108 (90 BASE) MCG/ACT IN AERS
2.0000 | INHALATION_SPRAY | RESPIRATORY_TRACT | Status: DC | PRN
  Administered 2013-03-03: 2 via RESPIRATORY_TRACT
  Filled 2013-03-02: qty 6.7

## 2013-03-02 MED ORDER — ALBUTEROL SULFATE (5 MG/ML) 0.5% IN NEBU
5.0000 mg | INHALATION_SOLUTION | Freq: Once | RESPIRATORY_TRACT | Status: AC
  Administered 2013-03-02: 5 mg via RESPIRATORY_TRACT
  Filled 2013-03-02: qty 1

## 2013-03-02 MED ORDER — OXYCODONE HCL 5 MG PO TABS
15.0000 mg | ORAL_TABLET | Freq: Four times a day (QID) | ORAL | Status: DC | PRN
  Administered 2013-03-02 – 2013-03-05 (×11): 15 mg via ORAL
  Filled 2013-03-02 (×11): qty 3

## 2013-03-02 MED ORDER — ENSURE COMPLETE PO LIQD
237.0000 mL | Freq: Three times a day (TID) | ORAL | Status: DC
  Administered 2013-03-03 – 2013-03-05 (×8): 237 mL via ORAL

## 2013-03-02 MED ORDER — LAMOTRIGINE 200 MG PO TABS
200.0000 mg | ORAL_TABLET | Freq: Every day | ORAL | Status: DC
  Filled 2013-03-02: qty 1

## 2013-03-02 MED ORDER — LAMOTRIGINE 100 MG PO TABS: 100.0000 mg | ORAL_TABLET | Freq: Every day | ORAL | Status: DC

## 2013-03-02 MED ORDER — OLANZAPINE 5 MG PO TABS
5.0000 mg | ORAL_TABLET | Freq: Every day | ORAL | Status: DC
  Administered 2013-03-02 – 2013-03-04 (×3): 5 mg via ORAL
  Filled 2013-03-02 (×4): qty 1

## 2013-03-02 MED ORDER — ZOLPIDEM TARTRATE 5 MG PO TABS
5.0000 mg | ORAL_TABLET | Freq: Every evening | ORAL | Status: DC | PRN
  Administered 2013-03-04: 5 mg via ORAL
  Filled 2013-03-02: qty 1

## 2013-03-02 MED ORDER — DULOXETINE HCL 60 MG PO CPEP
60.0000 mg | ORAL_CAPSULE | Freq: Every day | ORAL | Status: DC
  Administered 2013-03-02 – 2013-03-04 (×3): 60 mg via ORAL
  Filled 2013-03-02 (×4): qty 1

## 2013-03-02 MED ORDER — TRAMADOL HCL 50 MG PO TABS
50.0000 mg | ORAL_TABLET | Freq: Once | ORAL | Status: AC
  Administered 2013-03-02: 50 mg via ORAL
  Filled 2013-03-02: qty 1

## 2013-03-02 MED ORDER — LAMOTRIGINE 200 MG PO TABS
200.0000 mg | ORAL_TABLET | Freq: Every evening | ORAL | Status: DC
  Administered 2013-03-02 – 2013-03-04 (×4): 200 mg via ORAL
  Filled 2013-03-02 (×4): qty 1

## 2013-03-02 MED ORDER — LAMOTRIGINE 100 MG PO TABS: 100.0000 mg | ORAL_TABLET | Freq: Two times a day (BID) | ORAL | Status: DC

## 2013-03-02 MED ORDER — TIOTROPIUM BROMIDE MONOHYDRATE 18 MCG IN CAPS
18.0000 ug | ORAL_CAPSULE | Freq: Every day | RESPIRATORY_TRACT | Status: DC
  Administered 2013-03-03 – 2013-03-05 (×4): 18 ug via RESPIRATORY_TRACT
  Filled 2013-03-02: qty 5

## 2013-03-02 MED ORDER — FENTANYL 50 MCG/HR TD PT72
75.0000 ug | MEDICATED_PATCH | TRANSDERMAL | Status: DC
  Filled 2013-03-02 (×2): qty 1

## 2013-03-02 MED ORDER — TAPENTADOL HCL 50 MG PO TABS
100.0000 mg | ORAL_TABLET | Freq: Four times a day (QID) | ORAL | Status: DC
  Administered 2013-03-02 – 2013-03-05 (×12): 100 mg via ORAL
  Filled 2013-03-02 (×20): qty 2

## 2013-03-02 MED ORDER — ENOXAPARIN SODIUM 30 MG/0.3ML ~~LOC~~ SOLN
30.0000 mg | SUBCUTANEOUS | Status: DC
  Administered 2013-03-02 – 2013-03-04 (×3): 30 mg via SUBCUTANEOUS
  Filled 2013-03-02 (×4): qty 0.3

## 2013-03-02 MED ORDER — ALPRAZOLAM 0.25 MG PO TABS
0.2500 mg | ORAL_TABLET | Freq: Once | ORAL | Status: AC
  Administered 2013-03-02: 0.25 mg via ORAL
  Filled 2013-03-02: qty 1

## 2013-03-02 MED ORDER — PANTOPRAZOLE SODIUM 40 MG PO TBEC
40.0000 mg | DELAYED_RELEASE_TABLET | Freq: Every day | ORAL | Status: DC
  Administered 2013-03-02 – 2013-03-05 (×4): 40 mg via ORAL
  Filled 2013-03-02 (×4): qty 1

## 2013-03-02 MED ORDER — DULOXETINE HCL 30 MG PO CPEP: 30.0000 mg | ORAL_CAPSULE | Freq: Every day | ORAL | Status: DC

## 2013-03-02 MED ORDER — VITAMIN D3 25 MCG (1000 UNIT) PO TABS
1000.0000 [IU] | ORAL_TABLET | Freq: Every day | ORAL | Status: DC
  Administered 2013-03-02 – 2013-03-05 (×4): 1000 [IU] via ORAL
  Filled 2013-03-02 (×4): qty 1

## 2013-03-02 MED ORDER — ENSURE HIGH PROTEIN PO LIQD: 1.0000 | Freq: Three times a day (TID) | ORAL | Status: DC

## 2013-03-02 MED ORDER — DULOXETINE HCL 30 MG PO CPEP
30.0000 mg | ORAL_CAPSULE | Freq: Every day | ORAL | Status: DC
  Administered 2013-03-03 – 2013-03-05 (×3): 30 mg via ORAL
  Filled 2013-03-02 (×3): qty 1

## 2013-03-02 MED ORDER — MOMETASONE FURO-FORMOTEROL FUM 100-5 MCG/ACT IN AERO
2.0000 | INHALATION_SPRAY | Freq: Two times a day (BID) | RESPIRATORY_TRACT | Status: DC
  Administered 2013-03-02 – 2013-03-05 (×6): 2 via RESPIRATORY_TRACT
  Filled 2013-03-02: qty 8.8

## 2013-03-02 MED ORDER — LAMOTRIGINE 100 MG PO TABS
100.0000 mg | ORAL_TABLET | Freq: Every morning | ORAL | Status: DC
  Administered 2013-03-03 – 2013-03-05 (×3): 100 mg via ORAL
  Filled 2013-03-02 (×3): qty 1

## 2013-03-02 MED ORDER — ALPRAZOLAM 0.25 MG PO TABS
0.2500 mg | ORAL_TABLET | Freq: Two times a day (BID) | ORAL | Status: DC | PRN
  Administered 2013-03-03: 0.25 mg via ORAL
  Filled 2013-03-02: qty 1

## 2013-03-02 MED ORDER — ONDANSETRON HCL 4 MG PO TABS: 4.0000 mg | ORAL_TABLET | Freq: Three times a day (TID) | ORAL | Status: DC | PRN

## 2013-03-02 MED ORDER — METOCLOPRAMIDE HCL 10 MG PO TABS
10.0000 mg | ORAL_TABLET | Freq: Every day | ORAL | Status: DC
  Administered 2013-03-02 – 2013-03-04 (×3): 10 mg via ORAL
  Filled 2013-03-02 (×5): qty 1

## 2013-03-02 NOTE — ED Provider Notes (Signed)
CSN: [id number]     Arrival date & time 03/02/13  0915 History   First MD Initiated Contact with Patient 03/02/13 [phone]     Chief Complaint  Patient presents with  . Shortness of Breath   (Consider location/radiation/quality/duration/timing/severity/associated sxs/prior Treatment) Patient is a 62 y.o. female presenting with shortness of breath. The history is provided by the patient.  Shortness of Breath Associated symptoms: wheezing   Associated symptoms: no abdominal pain, no chest pain, no fever, no headaches, no neck pain, no rash, no sore throat and no vomiting   pt with copd, c/o wheezing and sob for the past few days.  Pt notes 2 prior ed visit, 1 admission, stating she doesn't feel like she was kept long enough as sob, wheezing persist.  Pt states quit smoking 3 days ago.  Uses home o2 3 liters continuously. Has recently increased home neb use to 4x/day. Denies home oral steroid use/pred.  Occasional non prod cough. No fever or chills. No chest pain or discomfort. No leg pain or swelling. No orthopnea or pnd.      Past Medical History  Diagnosis Date  . Depression   . Hypertension   . Suicidal ideation 2007     attempted overdose 2012  Dr. Gwendalyn Ege report  . Bipolar disorder   . Substance abuse      narcotics, alcohol, tobacco  . Chronic pain syndrome     follows at pain managment  . DEFICIENCY, VITAMIN D NOS 01/03/2007  . Chronic lower back pain   . Anxiety   . OSTEOPOROSIS 06/17/2009    DEXA 05/2009 : L femur -2.9; R femur -2.5. Alendronate on med list but not taking. Needs addressed ASAP as h/o fractures.    . Sciatic pain   . Anemia   . Elevated liver function tests   . COPD (chronic obstructive pulmonary disease)   . On home oxygen therapy     "2L; 24/7" (02/27/2013)  . Shortness of breath     "all the time" (02/27/2013)  . Arthritis     "legs, back; hips, primarily left hip" (02/27/2013)   Past Surgical History  Procedure Laterality Date  . Orif ankle  fracture Left 11/16/2010  . Cesarean section  1974; 1979  . Tubal ligation  1979  . Augmentation mammaplasty  ~ 2007   Family History  Problem Relation Age of Onset  . Stroke Neg Hx   . Cancer Neg Hx    History  Substance Use Topics  . Smoking status: Current Every Day Smoker -- 0.20 packs/day for 44 years    Types: Cigarettes  . Smokeless tobacco: Never Used     Comment: 02/27/2013 "smoking 2 cigarettes/day; that's down from 2 ppd"  . Alcohol Use: Yes     Comment: 02/27/2013 "nothing in 6 years; never had a problem w/it"   OB History   Grav Para Term Preterm Abortions TAB SAB Ect Mult Living                 Review of Systems  Constitutional: Negative for fever and chills.  HENT: Negative for sore throat.   Eyes: Negative for redness.  Respiratory: Positive for shortness of breath and wheezing.   Cardiovascular: Negative for chest pain and leg swelling.  Gastrointestinal: Negative for vomiting and abdominal pain.  Genitourinary: Negative for flank pain.  Musculoskeletal: Negative for back pain and neck pain.  Skin: Negative for rash.  Neurological: Negative for headaches.  Hematological: Does not bruise/bleed easily.  Psychiatric/Behavioral:  Negative for confusion.    Allergies  Banana and Pollen extract  Home Medications   Current Outpatient Rx  Name  Route  Sig  Dispense  Refill  . albuterol (PROAIR HFA) 108 (90 BASE) MCG/ACT inhaler      INHALE 1 PUFF IN MOUTH EVERY2- 4 HOURS AS NEEDED FOR WHEEZING   18 g   1   . albuterol (PROVENTIL) (2.5 MG/3ML) 0.083% nebulizer solution   Nebulization   Take 3 mLs (2.5 mg total) by nebulization every 4 (four) hours as needed. Shortness of breath   75 mL   12   . alendronate (FOSAMAX) 35 MG tablet   Oral   Take 35 mg by mouth every 7 (seven) days. Take in the morning with a full glass of water, on an empty stomach, and do not take anything else by mouth or lie down for the next 30 min.         Marland Kitchen ALPRAZolam (XANAX)  0.25 MG tablet   Oral   Take 1 tablet (0.25 mg total) by mouth 2 (two) times daily as needed for anxiety.   30 tablet   0   . AMBULATORY NON FORMULARY MEDICATION      Continuous O2 @@ 2LPM         . azithromycin (ZITHROMAX) 250 MG tablet   Oral   Take 1 tablet (250 mg total) by mouth daily.   5 tablet   0   . cetirizine (ZYRTEC) 5 MG tablet   Oral   Take 1 tablet (5 mg total) by mouth at bedtime as needed for rhinitis.   30 tablet   0   . cholecalciferol (VITAMIN D) 1000 UNITS tablet   Oral   Take 1,000 Units by mouth daily.         . DULoxetine (CYMBALTA) 30 MG capsule   Oral   Take 30-60 mg by mouth daily. Takes 30 mg in the morning and 60 mg in the evenings         . fentaNYL (DURAGESIC - DOSED MCG/HR) 75 MCG/HR   Transdermal   Place 75 mcg onto the skin every 3 (three) days.         . Fluticasone-Salmeterol (ADVAIR) 250-50 MCG/DOSE AEPB   Inhalation   Inhale 1 puff into the lungs 2 (two) times daily.   60 each   11   . lamoTRIgine (LAMICTAL) 100 MG tablet   Oral   Take 100-200 mg by mouth 2 (two) times daily. 1 tablet in the morning and 2 tablets at night         . metoCLOPramide (REGLAN) 10 MG tablet   Oral   Take 1 tablet (10 mg total) by mouth at bedtime.   30 tablet   6   . Nutritional Supplements (ENSURE HIGH PROTEIN) LIQD   Oral   Take 1 Can by mouth 3 (three) times daily.   90 Can   2   . OLANZapine (ZYPREXA) 5 MG tablet   Oral   Take 5 mg by mouth at bedtime.         Marland Kitchen omeprazole (PRILOSEC) 20 MG capsule   Oral   Take 1 capsule (20 mg total) by mouth daily.   30 capsule   3   . ondansetron (ZOFRAN) 4 MG tablet   Oral   Take 1 tablet (4 mg total) by mouth every 8 (eight) hours as needed for nausea.   20 tablet   0   . oxyCODONE (  ROXICODONE) 15 MG immediate release tablet   Oral   Take 15 mg by mouth every 6 (six) hours as needed for pain.         . predniSONE (DELTASONE) 50 MG tablet   Oral   Take 1 tablet (50 mg  total) by mouth daily with breakfast.   7 tablet   0   . Tapentadol HCl (NUCYNTA) 100 MG TABS   Oral   Take 100 mg by mouth 4 (four) times daily.         Marland Kitchen tiotropium (SPIRIVA) 18 MCG inhalation capsule   Inhalation   Place 1 capsule (18 mcg total) into inhaler and inhale daily.   32 capsule   11   . zolpidem (AMBIEN) 5 MG tablet   Oral   Take 1 tablet (5 mg total) by mouth at bedtime as needed for sleep.   30 tablet   0     Prescribed by dr Larey Dresser by mental health    There were no vitals taken for this visit. Physical Exam  Nursing note and vitals reviewed. Constitutional: She is oriented to person, place, and time. She appears distressed.  Very thin appearing, tachypneic, resp dis.   HENT:  Mouth/Throat: Oropharynx is clear and moist.  Eyes: Conjunctivae are normal. No scleral icterus.  Neck: Neck supple. No JVD present. No tracheal deviation present.  Cardiovascular: Normal rate, regular rhythm, normal heart sounds and intact distal pulses.   Pulmonary/Chest: She is in respiratory distress. She has wheezes.  Abdominal: Soft. Normal appearance and bowel sounds are normal. She exhibits no distension. There is no tenderness.  Musculoskeletal: She exhibits no edema and no tenderness.  Neurological: She is alert and oriented to person, place, and time.  Skin: Skin is warm and dry. No rash noted.  Psychiatric: She has a normal mood and affect.    ED Course  Procedures (including critical care time)   Results for orders placed during the hospital encounter of 03/02/13  CBC      Result Value Range   WBC 11.6 (*) 4.0 - 10.5 K/uL   RBC 3.24 (*) 3.87 - 5.11 MIL/uL   Hemoglobin 11.3 (*) 12.0 - 15.0 g/dL   HCT 16.1 (*) 09.6 - 04.5 %   MCV 104.6 (*) 78.0 - 100.0 fL   MCH 34.9 (*) 26.0 - 34.0 pg   MCHC 33.3  30.0 - 36.0 g/dL   RDW 40.9  81.1 - 91.4 %   Platelets 341  150 - 400 K/uL  COMPREHENSIVE METABOLIC PANEL      Result Value Range   Sodium 138  135 - 145 mEq/L    Potassium 3.6  3.5 - 5.1 mEq/L   Chloride 94 (*) 96 - 112 mEq/L   CO2 32  19 - 32 mEq/L   Glucose, Bld 105 (*) 70 - 99 mg/dL   BUN 8  6 - 23 mg/dL   Creatinine, Ser 7.82 (*) 0.50 - 1.10 mg/dL   Calcium 8.3 (*) 8.4 - 10.5 mg/dL   Total Protein 6.7  6.0 - 8.3 g/dL   Albumin 3.8  3.5 - 5.2 g/dL   AST 27  0 - 37 U/L   ALT 33  0 - 35 U/L   Alkaline Phosphatase 106  39 - 117 U/L   Total Bilirubin 0.1 (*) 0.3 - 1.2 mg/dL   GFR calc non Af Amer >90  >90 mL/min   GFR calc Af Amer >90  >90 mL/min   Dg Chest  2 View  03/02/2013   CLINICAL DATA:  Shortness of breath.  EXAM: CHEST  2 VIEW  COMPARISON:  Chest x-ray 02/27/2013.  FINDINGS: Marked hyperexpansion of the lungs with severe pruning of the pulmonary vasculature, most pronounced in the upper lungs, compatible with bullous emphysema. Diffuse peribronchial cuffing. No definite acute consolidative airspace disease. No pleural effusions. No definite pneumothorax. No evidence of pulmonary edema. Heart size is normal. Upper mediastinal contours are within normal limits.  IMPRESSION: 1. Chronic changes of advanced bullous emphysema redemonstrated, without definite radiographic evidence of acute cardiopulmonary disease.   Electronically Signed   By: Trudie Reed M.D.   On: 03/02/2013 10:38   Dg Chest Portable 1 View  02/27/2013   CLINICAL DATA:  Difficulty breathing  EXAM: PORTABLE CHEST - 1 VIEW  COMPARISON:  Portable exam 1837 hr compared to 05/07/2012  FINDINGS: Normal heart size, mediastinal contours, and pulmonary vascularity.  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  Mild scattered interstitial prominence at the lower lungs, likely unchanged accounting for differences in technique.  No acute infiltrate, pleural effusion or pneumothorax.  No acute osseous findings.  IMPRESSION: COPD changes.  No acute abnormalities.   Electronically Signed   By: Ulyses Southward M.D.   On: 02/27/2013 18:57     EKG  Interpretation   None       MDM  Iv ns. Labs. Cxr.  Albuterol and atrovent neb.  Ems alreay gave iv solumedrol, and neb x 2.  Reviewed nursing notes and prior charts for additional history.    After several nebs, improved air exchange, home persistent diffuse wheezing.    Pt feels requires admit, w persistent c/o dyspnea and wheezing.  pcp service called to admit.      Suzi Roots, MD 03/03/13 [phone]

## 2013-03-02 NOTE — Progress Notes (Signed)
Date: 03/02/2013               Patient Name:  [name] MRN: [id number]  DOB: 1950-07-20 Age / Sex: 62 y.o., female   PCP: Windell Hummingbird, MD              Medical Service: Internal Medicine Teaching Service              Attending Physician: Dr. Inez Catalina, MD    First Contact: Dr. Mariea Clonts Pager: [phone]  Second Contact: Dr. Zada Girt Pager: [phone]            After Hours (After 5p/  First Contact Pager: [phone]  weekends / holidays): Second Contact Pager: [phone]   Chief Complaint: Increased shortness of breath since morning.  History of Present Illness: [name] is a 62 y.o. female with PMH of COPD (GOLD IV on 2-3L O2 at home), depression, chronic pain, bipolar disorder, chronic N/V (unknown etiology, followed by GI) who presents w/ an episode of shortness of breath at home , which started on the morning of admission.  She reports that she suddenly developed shortness of breath shortly after she woke up. She felt scared and started breathing heavily. EMS was called and she received several breathing treatments en route. Her breathing significantly improved soon after she arrived in the emergency department. She denies intercurrent fevers, increased sputum production, increased cough, wheezing, chills, nausea, or vomiting. She reports good compliance with her home medications for COPD including albuterol nebulizer, Advair, Spiriva, and albuterol inhalers. During our evaluation in the ED she stated "please if you let me go home, you have to signed my death certificate".  The patient lives alone but she has a home aide, who comes to the house 7 days a week and spends a few hours/day. She requires assistance with ADLs. She reports that she can no longer live alone. Patient's sister was present at bedside and expressed the same concerns about her safety at home. Sister also requested a Ms. Williamson is placed in a short-term rehabilitation, where it will be safer for her.    Of note, patient had been evaluated in the ED at least twice over the past week, with a very similar presentation. During her evaluation on 03/01/2013, she was discharged from the emergency department, as it was determined that has symptoms were not due to COPD exacerbation, but instead severe anxiety that worsens her COPD symptoms/SOB. It was also felt that her COPD has progressed and the plan was referral to pulmonary rehabilitation. Patient was not interested in skilled nursing home placement at that time.   Review of Systems: Constitutional: Denies fever, chills, diaphoresis, appetite change and fatigue.  HEENT: Denies photophobia, eye pain, redness, hearing loss, ear pain, congestion, sore throat, rhinorrhea, sneezing, mouth sores, trouble swallowing, neck pain, neck stiffness and tinnitus.  Respiratory: Denies chest pain. Cardiovascular: Denies chest pain, palpitations and leg swelling.  Gastrointestinal: Denies nausea, vomiting, abdominal pain, diarrhea, constipation,blood in stool and abdominal distention.  Genitourinary: Denies dysuria, urgency, frequency, hematuria, flank pain and difficulty urinating.  Musculoskeletal: Reports chronic back pain, and requests for her fentanyl patches and her other pain medications which she takes at home.  Skin: Denies pallor, rash and wound.  Neurological: Denies dizziness, seizures, syncope, weakness, lightheadedness, numbness and headaches.  Hematological: Denies adenopathy. Easy bruising, personal or family bleeding history  Psychiatric/Behavioral: Denies suicidal ideation, mood changes, confusion, nervousness, sleep disturbance and agitation.  She admits to severe anxiety especially when she  is alone at home.   Medications Prior to Admission  Medication Sig Dispense Refill  . albuterol (PROAIR HFA) 108 (90 BASE) MCG/ACT inhaler INHALE 1 PUFF IN MOUTH EVERY2- 4 HOURS AS NEEDED FOR WHEEZING  18 g  1  . albuterol (PROVENTIL) (2.5 MG/3ML) 0.083%  nebulizer solution Take 3 mLs (2.5 mg total) by nebulization every 4 (four) hours as needed. Shortness of breath  75 mL  12  . ALPRAZolam (XANAX) 0.25 MG tablet Take 1 tablet (0.25 mg total) by mouth 2 (two) times daily as needed for anxiety.  30 tablet  0  . cholecalciferol (VITAMIN D) 1000 UNITS tablet Take 1,000 Units by mouth daily.      . DULoxetine (CYMBALTA) 30 MG capsule Take 30-60 mg by mouth 2 (two) times daily. Takes 30 mg in a.m. and 60 mg in p.m.      Marland Kitchen fentaNYL (DURAGESIC - DOSED MCG/HR) 75 MCG/HR Place 75 mcg onto the skin every 3 (three) days.      . Fluticasone-Salmeterol (ADVAIR) 250-50 MCG/DOSE AEPB Inhale 1 puff into the lungs 2 (two) times daily.  60 each  11  . lamoTRIgine (LAMICTAL) 100 MG tablet Take 100-200 mg by mouth 2 (two) times daily. 1 tablet in the morning and 2 tablets at night      . metoCLOPramide (REGLAN) 10 MG tablet Take 1 tablet (10 mg total) by mouth at bedtime.  30 tablet  6  . Nutritional Supplements (ENSURE HIGH PROTEIN) LIQD Take 1 Can by mouth 3 (three) times daily.  90 Can  2  . OLANZapine (ZYPREXA) 5 MG tablet Take 5 mg by mouth at bedtime.      Marland Kitchen omeprazole (PRILOSEC) 20 MG capsule Take 1 capsule (20 mg total) by mouth daily.  30 capsule  3  . ondansetron (ZOFRAN) 4 MG tablet Take 1 tablet (4 mg total) by mouth every 8 (eight) hours as needed for nausea.  20 tablet  0  . oxyCODONE (ROXICODONE) 15 MG immediate release tablet Take 15 mg by mouth every 6 (six) hours as needed for pain.      . Tapentadol HCl (NUCYNTA) 100 MG TABS Take 100 mg by mouth 4 (four) times daily.      Marland Kitchen tiotropium (SPIRIVA) 18 MCG inhalation capsule Place 1 capsule (18 mcg total) into inhaler and inhale daily.  32 capsule  11  . zolpidem (AMBIEN) 5 MG tablet Take 1 tablet (5 mg total) by mouth at bedtime as needed for sleep.  30 tablet  0  . AMBULATORY NON FORMULARY MEDICATION Continuous O2 @@ 2LPM        Allergies: Allergies as of 03/02/2013 - Review Complete 03/02/2013    Allergen Reaction Noted  . Banana Nausea And Vomiting 01/10/2012  . Pollen extract  11/20/2012   Past Medical History  Diagnosis Date  . Depression   . Hypertension   . Suicidal ideation 2007     attempted overdose 2012  Dr. Gwendalyn Ege report  . Bipolar disorder   . Substance abuse      narcotics, alcohol, tobacco  . Chronic pain syndrome     follows at pain managment  . DEFICIENCY, VITAMIN D NOS 01/03/2007  . Chronic lower back pain   . Anxiety   . OSTEOPOROSIS 06/17/2009    DEXA 05/2009 : L femur -2.9; R femur -2.5. Alendronate on med list but not taking. Needs addressed ASAP as h/o fractures.    . Sciatic pain   . Anemia   .  Elevated liver function tests   . COPD (chronic obstructive pulmonary disease)   . On home oxygen therapy     "2L; 24/7" (02/27/2013)  . Shortness of breath     "all the time" (02/27/2013)  . Arthritis     "legs, back; hips, primarily left hip" (02/27/2013)   Past Surgical History  Procedure Laterality Date  . Orif ankle fracture Left 11/16/2010  . Cesarean section  1974; 1979  . Tubal ligation  1979  . Augmentation mammaplasty  ~ 2007   Family History  Problem Relation Age of Onset  . Stroke Neg Hx   . Cancer Neg Hx    History   Social History  . Marital Status: Single    Spouse Name: N/A    Number of Children: 2  . Years of Education: N/A   Occupational History  . retired    Social History Main Topics  . Smoking status: Current Every Day Smoker -- 0.20 packs/day for 44 years    Types: Cigarettes  . Smokeless tobacco: Never Used     Comment: 02/27/2013 "smoking 2 cigarettes/day; that's down from 2 ppd"  . Alcohol Use: Yes     Comment: 02/27/2013 "nothing in 6 years; never had a problem w/it"  . Drug Use: No  . Sexual Activity: No   Other Topics Concern  . Not on file   Social History Narrative  . No narrative on file      Physical Exam: Filed Vitals:   03/02/13 1557  BP: 137/55  Pulse: 101  Temp: 99.7 F (37.6 C)   Resp: 18   General: Cachectic, alert, cooperative, and in no apparent distress; speaking in complete sentences, sister and home aid at bedside  HEENT: pupils equal round and reactive to light, vision grossly intact, oropharynx clear and non-erythematous, MMM  Neck: supple, no lymphadenopathy Lungs: Chest exam is clear with very minimal wheezing.  Abdomen: non-tender, non-distended, normal bowel sounds  Extremities: warm extremities, no BLE edema Neurologic: alert & oriented X3, cranial nerves II-XII grossly intact, strength grossly intact, sensation intact to li   Lab results: Basic Metabolic Panel:  Recent Labs  16/10/96 0946  NA 138  K 3.6  CL 94*  CO2 32  GLUCOSE 105*  BUN 8  CREATININE 0.34*  CALCIUM 8.3*   Liver Function Tests:  Recent Labs  03/02/13 0946  AST 27  ALT 33  ALKPHOS 106  BILITOT 0.1*  PROT 6.7  ALBUMIN 3.8   CBC:  Recent Labs  03/02/13 0946  WBC 11.6*  HGB 11.3*  HCT 33.9*  MCV 104.6*  PLT 341    Imaging results:  Dg Chest 2 View  03/02/2013   CLINICAL DATA:  Shortness of breath.  EXAM: CHEST  2 VIEW  COMPARISON:  Chest x-ray 02/27/2013.  FINDINGS: Marked hyperexpansion of the lungs with severe pruning of the pulmonary vasculature, most pronounced in the upper lungs, compatible with bullous emphysema. Diffuse peribronchial cuffing. No definite acute consolidative airspace disease. No pleural effusions. No definite pneumothorax. No evidence of pulmonary edema. Heart size is normal. Upper mediastinal contours are within normal limits.  IMPRESSION: 1. Chronic changes of advanced bullous emphysema redemonstrated, without definite radiographic evidence of acute cardiopulmonary disease.   Electronically Signed   By: Trudie Reed M.D.   On: 03/02/2013 10:38    Other results: EKG: NSR, normal axis and intervals, no ischemic changes; ?Q waves in V1-2.  Assessment & Plan by Problem: Principal Problem:   Shortness of breath  Active  Problems:   DEPRESSION   BACK PAIN, LUMBAR   OSTEOPOROSIS   BIPOLAR AFFECTIVE DISORDER, DEPRESSED, HX OF   NARCOTIC ABUSE   Bronchitis, allergic   COPD (chronic obstructive pulmonary disease)   Loss of weight   Chronic pain   Chronic respiratory failure   This is is a 75 female with PMH of COPD (GOLD IV on 2-3L O2 at home), depression, chronic pain, bipolar disorder, chronic N/V (unknown etiology, followed by GI) who presents w/ an episode of shortness of breath at home , which started on the morning of admission.  #SOB: Patient's presentation with acute shortness of breath in the setting of severe COPD raises a concern of COPD exacerbation versus PNA. However, patient admits to severe anxiety and sudden onset of shortness of breath. She reports "being very scared and feeling short of breath." A big component of this is most likely attributable to anxiety as opposed to COPD exacerbation or PNA. Patient and family expressed interest in a skilled nursing facility placement on this admission. At the time of her evaluation in the ED, patient was breathing stable, and chest exam was unremarkable (only mild wheezes). She had received 3 rounds of DuoNeb treatments and she appeared to be at baseline. Chest x-ray revealed chronic changes of advanced bullous emphysema without acute cardiopulmonary disease. Patient is afebrile without leukocytosis.   Plan. - Admitted to a MedSurg bed - SNF placement - Patient's family interested in speaking to social worker, likely on Monday. - Continue with home medications for COPD - Continue with home oxygen 2-3 L/min - No need for steroids or antibiotics  # Mental health disorders: Patient carries diagnoses of anxiety, depression, bipolar disorder. She is taking Lamictal, olanzapine, Cymbalta at home. No suicidal homicidal ideation.  -will continue home medications   # Macrocytic anemia: Vitamin B12 and folic acid level were normal in the past on 11/30/12. Her  medications such as Lamictal and olanzapine may have partially contributed. Hgb is stable 11.8 on 04/30/12 to 10.9 today.  -f/u CBC as outpatient   # Chronic nausea and vomiting: Unclear etiology, per GI, Dr Jarold Motto, patient's symptoms are most likely secondary to her severe COPD. Symptoms are controlled by Zofran and Reglan at home.  -will continue Zofran and Reglan  # Chronic Pain:  Chronic back pain, secondary to "sciatica". Continue with home medications including fentanyl patch, oxycodone, and Tapentadol.   Dispo: Disposition is deferred at this time, awaiting improvement of current medical problems. Anticipated discharge in approximately 2-3 day(s).   The patient does have a current PCP Windell Hummingbird, MD), therefore is require OPC follow-up after discharge.   The patient does have transportation limitations that hinder transportation to clinic appointments.   Signed:  Dow Adolph, MD PGY-2 Internal Medicine Teaching Service Pager: [phone] (7pm-7am) 03/02/2013, 5:39 PM

## 2013-03-02 NOTE — ED Notes (Signed)
Pt states she has chronic pain and takes 15mg  of oxycodone at home. Pt's Nurse tech spoke with RN outside of room and states that MD took pt off of 15mg  oxycodone bc she was abusing this. Pt's nurse tech also reports that pt has said multiple times that she has considered taking her life. Pt is not reporting these feelings today. Notified MD of this.

## 2013-03-02 NOTE — ED Notes (Signed)
Ordered pt heart healthy meal tray. Pt is resting in bed eating.

## 2013-03-02 NOTE — ED Notes (Signed)
Per EMS, pt woke up around 5am and felt very SOB. Pt took a nebulizer treatment at 6am then a rescue inhaler with minimal relief. EMS was called and once they arrived pt wanted to try duoneb before being transported. No relief. En route, pt received 125mg  solu medrol and a second duoneb with 5mg  albuterol and 1mg  atrovent. EKG was NSR. EMS vitals 140/82, HR 100, initial O2 88% on RA, now 100% 2L.. (Pt was here yesterday for the same problem)

## 2013-03-02 NOTE — ED Notes (Signed)
Pt fixated on chronic back pain and requesting more medication. Notified RN in report that pt is needing pain medication. Unable to obtain signed and held medication while in ED so will make sure pt is able to be medicated once arrives on floor.

## 2013-03-02 NOTE — H&P (Signed)
Date: 03/02/2013               Patient Name:  [name] MRN: [id number]  DOB: 1950-04-11 Age / Sex: 62 y.o., female   PCP: Windell Hummingbird, MD              Medical Service: Internal Medicine Teaching Service              Attending Physician: Dr. Inez Catalina, MD    First Contact: Dr. Mariea Clonts Pager: [phone]  Second Contact: Dr. Zada Girt Pager: [phone]            After Hours (After 5p/  First Contact Pager: [phone]  weekends / holidays): Second Contact Pager: [phone]   Chief Complaint: Increased shortness of breath since morning.  History of Present Illness: [name] is a 62 y.o. female with PMH of COPD (GOLD IV on 2-3L O2 at home), depression, chronic pain, bipolar disorder, chronic N/V (unknown etiology, followed by GI) who presents w/ an episode of shortness of breath at home , which started on the morning of admission.  She reports that she suddenly developed shortness of breath shortly after she woke up today. She felt scared and started breathing heavily. EMS was called and she received several breathing treatments enroute. Her breathing significantly improved soon after she arrived in the emergency department. She denies intercurrent fevers, increased sputum production, increased cough, wheezing, chills, nausea, or vomiting. She reports good compliance with her home medications for COPD including albuterol nebulizer, Advair, Spiriva, and albuterol inhalers. During our evaluation in the ED she stated "please if you let me go home, you will have to signed my death certificate".  The patient lives alone but she has a home aide, who comes to the house 7 days a week and spends a few hours/day. She requires assistance with ADLs. She reports that she can no longer live alone. Patient's sister was present at bedside and expressed the same concerns about her safety at home. Sister also requested that [name] is placed in a short-term rehabilitation, where it will be safer  for her.   Of note, patient had been evaluated in the ED at least twice over the past week, with a very similar presentation. During her evaluation on 03/01/2013, she was discharged from the emergency department, as it was determined that her symptoms were not due to COPD exacerbation, but instead severe anxiety that worsens her COPD symptoms/SOB. It was also felt that her COPD has progressed and the plan was referral to pulmonary rehabilitation. Patient was not interested in skilled nursing home placement at that time.   Review of Systems: Constitutional: Denies fever, chills, diaphoresis, appetite change and fatigue.  HEENT: Denies photophobia, eye pain, redness, hearing loss, ear pain, congestion, sore throat, rhinorrhea, sneezing, mouth sores, trouble swallowing, neck pain, neck stiffness and tinnitus.  Respiratory: Denies chest pain. Cardiovascular: Denies chest pain, palpitations and leg swelling.  Gastrointestinal: Denies nausea, vomiting, abdominal pain, diarrhea, constipation,blood in stool and abdominal distention.  Genitourinary: Denies dysuria, urgency, frequency, hematuria, flank pain and difficulty urinating.  Musculoskeletal: Reports chronic back pain, and requests for her fentanyl patches and her other pain medications which she takes at home.  Skin: Denies pallor, rash and wound.  Neurological: Denies dizziness, seizures, syncope, weakness, lightheadedness, numbness and headaches.  Hematological: Denies adenopathy. Easy bruising, personal or family bleeding history  Psychiatric/Behavioral: Denies suicidal ideation, mood changes, confusion, nervousness, sleep disturbance and agitation.  She admits to severe anxiety especially when  she is alone at home.   Medications Prior to Admission  Medication Sig Dispense Refill  . albuterol (PROAIR HFA) 108 (90 BASE) MCG/ACT inhaler INHALE 1 PUFF IN MOUTH EVERY2- 4 HOURS AS NEEDED FOR WHEEZING  18 g  1  . albuterol (PROVENTIL) (2.5 MG/3ML)  0.083% nebulizer solution Take 3 mLs (2.5 mg total) by nebulization every 4 (four) hours as needed. Shortness of breath  75 mL  12  . ALPRAZolam (XANAX) 0.25 MG tablet Take 1 tablet (0.25 mg total) by mouth 2 (two) times daily as needed for anxiety.  30 tablet  0  . cholecalciferol (VITAMIN D) 1000 UNITS tablet Take 1,000 Units by mouth daily.      . DULoxetine (CYMBALTA) 30 MG capsule Take 30-60 mg by mouth 2 (two) times daily. Takes 30 mg in a.m. and 60 mg in p.m.      Marland Kitchen fentaNYL (DURAGESIC - DOSED MCG/HR) 75 MCG/HR Place 75 mcg onto the skin every 3 (three) days.      . Fluticasone-Salmeterol (ADVAIR) 250-50 MCG/DOSE AEPB Inhale 1 puff into the lungs 2 (two) times daily.  60 each  11  . lamoTRIgine (LAMICTAL) 100 MG tablet Take 100-200 mg by mouth 2 (two) times daily. 1 tablet in the morning and 2 tablets at night      . metoCLOPramide (REGLAN) 10 MG tablet Take 1 tablet (10 mg total) by mouth at bedtime.  30 tablet  6  . Nutritional Supplements (ENSURE HIGH PROTEIN) LIQD Take 1 Can by mouth 3 (three) times daily.  90 Can  2  . OLANZapine (ZYPREXA) 5 MG tablet Take 5 mg by mouth at bedtime.      Marland Kitchen omeprazole (PRILOSEC) 20 MG capsule Take 1 capsule (20 mg total) by mouth daily.  30 capsule  3  . ondansetron (ZOFRAN) 4 MG tablet Take 1 tablet (4 mg total) by mouth every 8 (eight) hours as needed for nausea.  20 tablet  0  . oxyCODONE (ROXICODONE) 15 MG immediate release tablet Take 15 mg by mouth every 6 (six) hours as needed for pain.      . Tapentadol HCl (NUCYNTA) 100 MG TABS Take 100 mg by mouth 4 (four) times daily.      Marland Kitchen tiotropium (SPIRIVA) 18 MCG inhalation capsule Place 1 capsule (18 mcg total) into inhaler and inhale daily.  32 capsule  11  . zolpidem (AMBIEN) 5 MG tablet Take 1 tablet (5 mg total) by mouth at bedtime as needed for sleep.  30 tablet  0  . AMBULATORY NON FORMULARY MEDICATION Continuous O2 @@ 2LPM        Allergies: Allergies as of 03/02/2013 - Review Complete 03/02/2013   Allergen Reaction Noted  . Banana Nausea And Vomiting 01/10/2012  . Pollen extract  11/20/2012   Past Medical History  Diagnosis Date  . Depression   . Hypertension   . Suicidal ideation 2007     attempted overdose 2012  Dr. Gwendalyn Ege report  . Bipolar disorder   . Substance abuse      narcotics, alcohol, tobacco  . Chronic pain syndrome     follows at pain managment  . DEFICIENCY, VITAMIN D NOS 01/03/2007  . Chronic lower back pain   . Anxiety   . OSTEOPOROSIS 06/17/2009    DEXA 05/2009 : L femur -2.9; R femur -2.5. Alendronate on med list but not taking. Needs addressed ASAP as h/o fractures.    . Sciatic pain   . Anemia   .  Elevated liver function tests   . COPD (chronic obstructive pulmonary disease)   . On home oxygen therapy     "2L; 24/7" (02/27/2013)  . Shortness of breath     "all the time" (02/27/2013)  . Arthritis     "legs, back; hips, primarily left hip" (02/27/2013)   Past Surgical History  Procedure Laterality Date  . Orif ankle fracture Left 11/16/2010  . Cesarean section  1974; 1979  . Tubal ligation  1979  . Augmentation mammaplasty  ~ 2007   Family History  Problem Relation Age of Onset  . Stroke Neg Hx   . Cancer Neg Hx    History   Social History  . Marital Status: Single    Spouse Name: N/A    Number of Children: 2  . Years of Education: N/A   Occupational History  . retired    Social History Main Topics  . Smoking status: Current Every Day Smoker -- 0.20 packs/day for 44 years    Types: Cigarettes  . Smokeless tobacco: Never Used     Comment: 02/27/2013 "smoking 2 cigarettes/day; that's down from 2 ppd"  . Alcohol Use: Yes     Comment: 02/27/2013 "nothing in 6 years; never had a problem w/it"  . Drug Use: No  . Sexual Activity: No   Other Topics Concern  . Not on file   Social History Narrative  . No narrative on file      Physical Exam: Filed Vitals:   03/02/13 2221  BP: 128/64  Pulse: 92  Temp: 98.3 F (36.8 C)   Resp: 16   General: Cachectic, alert, cooperative, and in no apparent distress; speaking in complete sentences, sister and home aid at bedside  HEENT: pupils equal round and reactive to light, vision grossly intact, oropharynx clear and non-erythematous, MMM  Neck: supple, no lymphadenopathy Lungs: Chest exam is clear with very minimal wheezing.  Abdomen: non-tender, non-distended, normal bowel sounds  Extremities: warm extremities, no BLE edema Neurologic: alert & oriented X3, cranial nerves II-XII grossly intact, strength grossly intact, sensation intact to li   Lab results: Basic Metabolic Panel:  Recent Labs  08/65/78 0946  NA 138  K 3.6  CL 94*  CO2 32  GLUCOSE 105*  BUN 8  CREATININE 0.34*  CALCIUM 8.3*   Liver Function Tests:  Recent Labs  03/02/13 0946  AST 27  ALT 33  ALKPHOS 106  BILITOT 0.1*  PROT 6.7  ALBUMIN 3.8   CBC:  Recent Labs  03/02/13 0946  WBC 11.6*  HGB 11.3*  HCT 33.9*  MCV 104.6*  PLT 341    Imaging results:  Dg Chest 2 View  03/02/2013   CLINICAL DATA:  Shortness of breath.  EXAM: CHEST  2 VIEW  COMPARISON:  Chest x-ray 02/27/2013.  FINDINGS: Marked hyperexpansion of the lungs with severe pruning of the pulmonary vasculature, most pronounced in the upper lungs, compatible with bullous emphysema. Diffuse peribronchial cuffing. No definite acute consolidative airspace disease. No pleural effusions. No definite pneumothorax. No evidence of pulmonary edema. Heart size is normal. Upper mediastinal contours are within normal limits.  IMPRESSION: 1. Chronic changes of advanced bullous emphysema redemonstrated, without definite radiographic evidence of acute cardiopulmonary disease.   Electronically Signed   By: Trudie Reed M.D.   On: 03/02/2013 10:38    Other results: EKG: NSR, normal axis and intervals, no ischemic changes; ?Q waves in V1-2.  Assessment & Plan by Problem: Principal Problem:   Shortness of breath  Active  Problems:   DEPRESSION   BACK PAIN, LUMBAR   OSTEOPOROSIS   BIPOLAR AFFECTIVE DISORDER, DEPRESSED, HX OF   NARCOTIC ABUSE   Bronchitis, allergic   COPD (chronic obstructive pulmonary disease)   Loss of weight   Chronic pain   Chronic respiratory failure   This is is a 59 female with PMH of COPD (GOLD IV on 2-3L O2 at home), depression, chronic pain, bipolar disorder, chronic N/V (unknown etiology, followed by GI) who presents w/ an episode of shortness of breath at home , which started on the morning of admission.  #SOB: Patient's presentation with acute shortness of breath in the setting of severe COPD raises a concern of COPD exacerbation versus PNA. However, patient admits to severe anxiety and sudden onset of shortness of breath which has happened three times this week. She reports "being very scared followed by feeling short of breath." A big component of this is most likely attributable to anxiety as opposed to COPD exacerbation or PNA. Patient and family expressed interest in a skilled nursing facility placement on this admission. At the time of ou evaluation in the ED, patient was breathing stable, and chest exam was unremarkable (only mild wheezes on chest exam). She had received 3 rounds of DuoNeb treatments and she appeared to be at baseline. Chest x-ray revealed chronic changes of advanced bullous emphysema without acute cardiopulmonary disease. Patient is afebrile without leukocytosis.   Plan. - Admitted to a MedSurg bed - SNF placement - Patient's family interested in speaking to social worker, likely on Monday. - Continue with home medications for COPD - Continue with home oxygen 2-3 L/min - No need for steroids or antibiotics  # Mental health disorders: Patient carries diagnoses of anxiety, depression, bipolar disorder. She is taking Lamictal, olanzapine, Cymbalta at home. No suicidal homicidal ideation.  -will continue home medications   # Macrocytic anemia: Vitamin B12  and folic acid level were normal in the past on 11/30/12. Her medications such as Lamictal and olanzapine may have partially contributed. Hgb is stable 11.8 on 04/30/12 to 10.9 today.  -f/u CBC as outpatient   # Chronic nausea and vomiting: Unclear etiology, per GI, Dr Jarold Motto, patient's symptoms are most likely secondary to her severe COPD. Symptoms are controlled by Zofran and Reglan at home.  -will continue Zofran and Reglan  # Chronic Pain:  Chronic back pain, secondary to "sciatica". Continue with home medications including fentanyl patch, oxycodone, and Tapentadol.   Dispo: Disposition is deferred at this time, awaiting improvement of current medical problems. Anticipated discharge in approximately 2-3 day(s).   The patient does have a current PCP Windell Hummingbird, MD), therefore is require OPC follow-up after discharge.   The patient does have transportation limitations that hinder transportation to clinic appointments.   Signed:  Dow Adolph, MD PGY-2 Internal Medicine Teaching Service Pager: [phone] (7pm-7am) 03/02/2013, 10:39 PM

## 2013-03-02 NOTE — ED Notes (Signed)
Internal medicine MD at bedside

## 2013-03-03 MED ORDER — PREDNISONE 20 MG PO TABS
20.0000 mg | ORAL_TABLET | Freq: Every day | ORAL | Status: DC
  Filled 2013-03-03: qty 1

## 2013-03-03 MED ORDER — AZITHROMYCIN 250 MG PO TABS
250.0000 mg | ORAL_TABLET | Freq: Every day | ORAL | Status: DC
  Administered 2013-03-03 – 2013-03-05 (×3): 250 mg via ORAL
  Filled 2013-03-03 (×3): qty 1

## 2013-03-03 MED ORDER — ALBUTEROL SULFATE (5 MG/ML) 0.5% IN NEBU
2.5000 mg | INHALATION_SOLUTION | RESPIRATORY_TRACT | Status: DC
  Administered 2013-03-03 – 2013-03-05 (×12): 2.5 mg via RESPIRATORY_TRACT
  Filled 2013-03-03 (×12): qty 0.5

## 2013-03-03 MED ORDER — PNEUMOCOCCAL VAC POLYVALENT 25 MCG/0.5ML IJ INJ
0.5000 mL | INJECTION | INTRAMUSCULAR | Status: AC
  Administered 2013-03-04: 0.5 mL via INTRAMUSCULAR
  Filled 2013-03-03: qty 0.5

## 2013-03-03 MED ORDER — PREDNISONE 20 MG PO TABS
20.0000 mg | ORAL_TABLET | Freq: Every day | ORAL | Status: DC
  Administered 2013-03-04 – 2013-03-05 (×2): 20 mg via ORAL
  Filled 2013-03-03 (×3): qty 1

## 2013-03-03 MED ORDER — ALBUTEROL SULFATE HFA 108 (90 BASE) MCG/ACT IN AERS
1.0000 | INHALATION_SPRAY | Freq: Four times a day (QID) | RESPIRATORY_TRACT | Status: DC | PRN
Start: 2013-03-03 — End: 2013-03-05
  Administered 2013-03-04: 2 via RESPIRATORY_TRACT
  Filled 2013-03-03: qty 6.7

## 2013-03-03 MED ORDER — ALBUTEROL SULFATE (5 MG/ML) 0.5% IN NEBU: 2.5000 mg | INHALATION_SOLUTION | Freq: Four times a day (QID) | RESPIRATORY_TRACT | Status: DC

## 2013-03-03 MED ORDER — ACETAMINOPHEN 325 MG PO TABS
650.0000 mg | ORAL_TABLET | Freq: Four times a day (QID) | ORAL | Status: DC | PRN
  Administered 2013-03-03 – 2013-03-05 (×6): 650 mg via ORAL
  Filled 2013-03-03 (×6): qty 2

## 2013-03-03 MED ORDER — ALBUTEROL SULFATE (5 MG/ML) 0.5% IN NEBU: 2.5000 mg | INHALATION_SOLUTION | RESPIRATORY_TRACT | Status: DC | PRN

## 2013-03-03 MED ORDER — ALPRAZOLAM 0.25 MG PO TABS
0.2500 mg | ORAL_TABLET | Freq: Three times a day (TID) | ORAL | Status: DC | PRN
  Administered 2013-03-03 – 2013-03-05 (×6): 0.25 mg via ORAL
  Filled 2013-03-03 (×6): qty 1

## 2013-03-03 NOTE — H&P (Addendum)
  Date: 03/03/2013  Patient name: [name]  Medical record number: [id number]  Date of birth: 1950/04/16   I have seen and evaluated [name] and discussed their care with the Residency Team.  [name] is a 62yo woman, well known to our service, who presented again for acute SOB in the setting of severe COPD.  She was treated for a COPD exacerbation earlier this week, unclear if she was taking steroids/azithromycin at home.  She returns for acute episodes of breathlessness, feeling scared and tight sensation in her chest with a feeling of impending doom.  She reports these make her very anxious to be at home on her own.  She is compliant with her medications.  Her inhalers work somewhat to improve her symptoms.   Assessment and Plan: I have seen and evaluated the patient as outlined above. I agree with the formulated Assessment and Plan as detailed in the residents' admission note, with the following changes:   1. Severe COPD, recovering from an acute exacerbation with likely anxiety/air hunger: Agree with admission, scheduled nebs, restart prednisone for completion of exacerbation treatment and azithromycin.  We will start her on a low dose anxiolytic for her symptoms, will attempt PRN to start but schedule if needed.  She has agreed to look into ALF or SNF.  Will need new PT evaluation given change in status.   Continue home O2.  2. Chronic pain: She is on a fentanyl patch, but given her thinness, this may not be helping her much.  Will consider changing her pain control while in house  3. 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   Other issues per resident note.   Jordan Catalina, MD 12/21/201411:43 AM

## 2013-03-03 NOTE — Progress Notes (Signed)
Clinical Social Work Department BRIEF PSYCHOSOCIAL ASSESSMENT 03/03/2013  Patient:  RUCHAMA, KUBICEK     Account Number:  [id number]     Admit date:  03/02/2013  Clinical Social Worker:  Hendricks Milo  Date/Time:  03/03/2013 03:21 PM  Referred by:  Physician  Date Referred:  03/03/2013 Referred for  SNF Placement   Other Referral:   Interview type:  Patient Other interview type:    PSYCHOSOCIAL DATA Living Status:  ALONE Admitted from facility:   Level of care:   Primary support name:  Celestia Khat Primary support relationship to patient:  SIBLING Degree of support available:   Very supportive.    CURRENT CONCERNS  Other Concerns:    SOCIAL WORK ASSESSMENT / PLAN Clinical Social Worker (CSW) met with patient to discuss SNF placement. Patient reported that she wanted to go to [facility]. Patient is agreeable to SNF search in [facility]. CSW spoke with admissions coordinator at [facility] who reported that she would make a bed offer. Paula Compton also submitted the patient's PASARR on Friday 03/01/13.   Assessment/plan status:  Psychosocial Support/Ongoing Assessment of Needs Other assessment/ plan:   Information/referral to community resources:   CSW gave patient's SNF list.    PATIENT'S/FAMILY'S RESPONSE TO PLAN OF CARE: Patient thanked CSW for visit.

## 2013-03-03 NOTE — Progress Notes (Signed)
Subjective: Her shortness of breath is improving. She reports that she never filled her prescription for prednisone or azithromycin after her discharge from the hospital. She is very interested in going to SNF soon, perhaps on Monday.   Objective: Vital signs in last 24 hours: Filed Vitals:   03/02/13 2221 03/03/13 0601 03/03/13 1220 03/03/13 1418  BP: 128/64 145/71  158/83  Pulse: 92 83 102 98  Temp: 98.3 F (36.8 C) 98.3 F (36.8 C)  98.6 F (37 C)  TempSrc: Oral Oral    Resp: 16 16 18 18   SpO2: 96% 98% 96% 97%   Weight change:   Intake/Output Summary (Last 24 hours) at 03/03/13 1743 Last data filed at 03/03/13 1033  Gross per 24 hour  Intake    360 ml  Output      0 ml  Net    360 ml   Vitals reviewed. General: Sitting in bed, in NAD HEENT: no scleral icterus, wearing glasses, MMM Cardiac: RRR, no rubs, murmurs or gallops Pulm: clear to auscultation bilaterally with expiratory wheezing diffusely Abd: soft, nontender, nondistended, BS present Ext: warm and well perfused, no pedal edema Neuro: alert and oriented X3, moves all extremities voluntarily  Lab Results: Basic Metabolic Panel:  Recent Labs Lab 02/27/13 1725 03/02/13 0946  NA 129* 138  K 4.1 3.6  CL 90* 94*  CO2 29 32  GLUCOSE 99 105*  BUN 7 8  CREATININE 0.33* 0.34*  CALCIUM 7.7* 8.3*   Liver Function Tests:  Recent Labs Lab 03/02/13 0946  AST 27  ALT 33  ALKPHOS 106  BILITOT 0.1*  PROT 6.7  ALBUMIN 3.8   CBC:  Recent Labs Lab 02/27/13 1725 03/02/13 0946  WBC 6.9 11.6*  HGB 10.9* 11.3*  HCT 32.2* 33.9*  MCV 104.2* 104.6*  PLT 330 341   Fasting Lipid Panel:  Recent Labs Lab 02/26/13 1155  CHOL 184  HDL 91  LDLCALC 76  TRIG 83  CHOLHDL 2.0   Thyroid Function Tests:  Recent Labs Lab 02/26/13 1155  TSH 0.508   Urine Drug Screen: Drugs of Abuse     Component Value Date/Time   LABOPIA NONE DETECTED 01/10/2012 1339   COCAINSCRNUR NONE DETECTED 01/10/2012 1339   LABBENZ NONE DETECTED 01/10/2012 1339   AMPHETMU NONE DETECTED 01/10/2012 1339   THCU NONE DETECTED 01/10/2012 1339   LABBARB NONE DETECTED 01/10/2012 1339     Studies/Results: Dg Chest 2 View  03/02/2013   CLINICAL DATA:  Shortness of breath.  EXAM: CHEST  2 VIEW  COMPARISON:  Chest x-ray 02/27/2013.  FINDINGS: Marked hyperexpansion of the lungs with severe pruning of the pulmonary vasculature, most pronounced in the upper lungs, compatible with bullous emphysema. Diffuse peribronchial cuffing. No definite acute consolidative airspace disease. No pleural effusions. No definite pneumothorax. No evidence of pulmonary edema. Heart size is normal. Upper mediastinal contours are within normal limits.  IMPRESSION: 1. Chronic changes of advanced bullous emphysema redemonstrated, without definite radiographic evidence of acute cardiopulmonary disease.   Electronically Signed   By: Trudie Reed M.D.   On: 03/02/2013 10:38   Medications: I have reviewed the patient's current medications. Scheduled Meds: . albuterol  2.5 mg Nebulization Q4H  . azithromycin  250 mg Oral Daily  . cholecalciferol  1,000 Units Oral Daily  . DULoxetine  30 mg Oral Daily  . DULoxetine  60 mg Oral QHS  . enoxaparin (LOVENOX) injection  30 mg Subcutaneous Q24H  . feeding supplement (ENSURE COMPLETE)  237  mL Oral TID BM  . fentaNYL  75 mcg Transdermal Q72H  . lamoTRIgine  100 mg Oral q morning - 10a  . lamoTRIgine  200 mg Oral QPM  . metoCLOPramide  10 mg Oral QHS  . mometasone-formoterol  2 puff Inhalation BID  . OLANZapine  5 mg Oral QHS  . pantoprazole  40 mg Oral Daily  . [START ON 03/04/2013] pneumococcal 23 valent vaccine  0.5 mL Intramuscular Tomorrow-1000  . [START ON 03/04/2013] predniSONE  20 mg Oral Q breakfast  . tapentadol  100 mg Oral QID  . tiotropium  18 mcg Inhalation Daily   Continuous Infusions:  PRN Meds:.acetaminophen, albuterol, ALPRAZolam, ondansetron, oxyCODONE,  zolpidem Assessment/Plan: This is is a 89 female with PMH of COPD (GOLD IV on 2-3L O2 at home), depression, chronic pain, bipolar disorder, chronic N/V (unknown etiology, followed by GI) who presents w/ an episode of shortness of breath at home , which started on the morning of admission.   SOB: She had not picked up her prednisone or azithromycin after her discharge from the hospital for her COPD exacerbation. She continues to have a cough mildly productive of yellow sputum. She has experienced air hunger episodes and reports "being very scared followed by feeling short of breath". Although she has refused SNF placement in the past, she is now very interested in being in a facility as she requires assistance for her ADLs. She understands she has severe COPD. CXR with no acute changes, she is afebrile, with no leucocytosis, making PNA less likely.  - CSW actively working on SNF placement  - Continue with home oxygen 2-3 L/min  - Xanax 0.25mg  TID PRN for air hunger/anxiety - Resume antibiotic treatment with azithromycin, will likely need 4 more days for a total of 5 day therapy.  - DuoNebs with Xopenex q4hours, albuterol inhaler q6hr PRN for wheezing/shortness of breath.  - Prednisone 20mg  daily, would benefit from 4 more days of tx - incentive spirometry, flutter valve - Nutrition consult  Mental health disorders: Patient carries diagnoses of anxiety, depression, bipolar disorder. She is taking Lamictal, olanzapine, Cymbalta at home. No suicidal homicidal ideation.  -will continue home medications   Macrocytic anemia: Vitamin B12 and folic acid level were normal in the past on 11/30/12. Her medications such as Lamictal and olanzapine may have partially contributed. Hgb is stable 11.8 on 04/30/12 to 10.9 presentation with no signs of bleeding.   -f/u CBC as outpatient   Chronic nausea and vomiting: Unclear etiology, per GI, Dr Jarold Motto, patient's symptoms are most likely secondary to her severe  COPD. Symptoms are controlled by Zofran and Reglan at home.  -will continue Zofran and Reglan   Chronic Pain: Chronic back pain, secondary to "sciatica". Continue with home medications including fentanyl patch (though she has very little adipose tissue with likely not much benefit from this medication), oxycodone, and Tapentadol.   DVT prophylaxis: Heparin SQ  Diet: regular   Dispo: Disposition is deferred at this time, awaiting improvement of current medical problems.  Anticipated discharge in approximately 1-2 day(s).   The patient does have a current PCP Windell Hummingbird, MD) and does need an [facility] hospital follow-up appointment after discharge.  The patient does have transportation limitations that hinder transportation to clinic appointments.  .Services Needed at time of discharge: Y = Yes, Blank = No PT:   OT:   RN:   Equipment:   Other:     LOS: 1 day   Ky Barban, MD 03/03/2013, 5:43  PM

## 2013-03-03 NOTE — Progress Notes (Signed)
Clinical Social Work Department CLINICAL SOCIAL WORK PLACEMENT NOTE 03/03/2013  Patient:  [name], [name]  Account Number:  [id number] Admit date:  03/02/2013  Clinical Social Worker:  Jetta Lout, Theresia Majors  Date/time:  03/03/2013 03:25 PM  Clinical Social Work is seeking post-discharge placement for this patient at the following level of care:   SKILLED NURSING   (*CSW will update this form in Epic as items are completed)   03/03/2013  Patient/family provided with [facility] Department of Clinical Social Work's list of facilities offering this level of care within the geographic area requested by the patient (or if unable, by the patient's family).  03/03/2013  Patient/family informed of their freedom to choose among providers that offer the needed level of care, that participate in Medicare, Medicaid or managed care program needed by the patient, have an available bed and are willing to accept the patient.  03/03/2013  Patient/family informed of [facility]' ownership interest in [facility], as well as of the fact that they are under no obligation to receive care at this facility.  PASARR submitted to EDS on 03/01/2013 PASARR number received from EDS on   FL2 transmitted to all facilities in geographic area requested by pt/family on   FL2 transmitted to all facilities within larger geographic area on   Patient informed that his/her managed care company has contracts with or will negotiate with  certain facilities, including the following:     Patient/family informed of bed offers received:   Patient chooses bed at  Physician recommends and patient chooses bed at    Patient to be transferred to  on   Patient to be transferred to facility by   The following physician request were entered in Epic:   Additional Comments: Admissions coordinator Paula Compton submitted for PASARR on 03/01/13.

## 2013-03-04 ENCOUNTER — Telehealth (HOSPITAL_COMMUNITY): Payer: Self-pay | Admitting: *Deleted

## 2013-03-04 ENCOUNTER — Encounter (HOSPITAL_COMMUNITY): Payer: Self-pay | Admitting: Emergency Medicine

## 2013-03-04 DIAGNOSIS — J441 Chronic obstructive pulmonary disease with (acute) exacerbation: Secondary | ICD-10-CM | POA: Diagnosis present

## 2013-03-04 DIAGNOSIS — D539 Nutritional anemia, unspecified: Secondary | ICD-10-CM

## 2013-03-04 DIAGNOSIS — E43 Unspecified severe protein-calorie malnutrition: Secondary | ICD-10-CM | POA: Diagnosis present

## 2013-03-04 MED ORDER — OXYCODONE HCL 15 MG PO TABS: 15.0000 mg | ORAL_TABLET | Freq: Four times a day (QID) | ORAL | Status: DC | PRN

## 2013-03-04 MED ORDER — AZITHROMYCIN 250 MG PO TABS: 250.0000 mg | ORAL_TABLET | Freq: Every day | ORAL | Status: DC

## 2013-03-04 MED ORDER — FENTANYL 75 MCG/HR TD PT72: 75.0000 ug | MEDICATED_PATCH | TRANSDERMAL | Status: DC

## 2013-03-04 MED ORDER — PREDNISONE 20 MG PO TABS: 20.0000 mg | ORAL_TABLET | Freq: Every day | ORAL | Status: DC

## 2013-03-04 MED ORDER — ALPRAZOLAM 0.25 MG PO TABS: 0.2500 mg | ORAL_TABLET | Freq: Two times a day (BID) | ORAL | Status: DC | PRN

## 2013-03-04 NOTE — Evaluation (Signed)
Physical Therapy Evaluation Patient Details Name: [name] MRN: [id number] DOB: August 20, 1950 Today's Date: 03/04/2013 Time: 4098-1191 PT Time Calculation (min): 20 min  PT Assessment / Plan / Recommendation History of Present Illness  Pt is a 62 y/o female admitted s/p COPD exacerbation. Pt was recently treated for a COPD exacerbation and d/c'd home last week.  Clinical Impression  This patient presents with acute pain and decreased functional independence following a COPD exacerbation. At the time of PT eval, pt required use of rescue inhaler at beginning of session for sudden "attack" while pt was supine - not brought on by activity or talking. Decreased distance able to ambulate at one time due to reported SOB, however pt appeared to be somewhat self-limiting, as pt did not appear to be in any respiratory distress throughout gait training. . This patient is appropriate for skilled PT interventions to address functional limitations, improve safety and independence with functional mobility, and return to PLOF.     PT Assessment  Patient needs continued PT services    Follow Up Recommendations  SNF    Does the patient have the potential to tolerate intense rehabilitation      Barriers to Discharge Decreased caregiver support Pt lives alone    Equipment Recommendations  None recommended by PT (Equipment needs TBD by next venue of care)    Recommendations for Other Services     Frequency Min 3X/week    Precautions / Restrictions Precautions Precautions: Fall Restrictions Weight Bearing Restrictions: No   Pertinent Vitals/Pain On 3L/min supplemental O2 throughout session. Pt reports severe SOB and RN was contacted for rescue inhaler. Pt promptly returned to normal state after use.       Mobility  Bed Mobility Bed Mobility: Supine to Sit;Sitting - Scoot to Edge of Bed Supine to Sit: 5: Supervision;HOB elevated Sitting - Scoot to Edge of Bed: 6: Modified independent  (Device/Increase time) Details for Bed Mobility Assistance: Pt states she sleeps on 2 pillows at home. HOB elevated to simulate home environment, however pt required [facility] higher than home due to SOB.  Transfers Transfers: Sit to Stand;Stand to Sit Sit to Stand: 4: Min guard;From bed;With upper extremity assist Stand to Sit: 4: Min guard;To chair/3-in-1;With upper extremity assist Details for Transfer Assistance: VC's for hand placement and safety awareness regarding locking brakes on rollator. Ambulation/Gait Ambulation/Gait Assistance: 4: Min guard Ambulation Distance (Feet): 25 Feet Assistive device: 4-wheeled walker Ambulation/Gait Assistance Details: No physical assist required. On 3L/min supplemental O2 throughout gait training. Pt somewhat self-limiting with distance, stating need to go back to the chair due to increased SOB however pt did not appear to be in any increased respiratory distress.  Gait Pattern: Step-through pattern;Decreased stride length;Shuffle Gait velocity: Decreased Stairs: No Wheelchair Mobility Wheelchair Mobility: No    Exercises General Exercises - Lower Extremity Ankle Circles/Pumps: 10 reps Long Arc Quad: 10 reps Hip ABduction/ADduction: 10 reps (Isometric adduction with pillow)   PT Diagnosis: Difficulty walking;Acute pain  PT Problem List: Decreased strength;Decreased range of motion;Decreased activity tolerance;Decreased balance;Decreased mobility;Decreased knowledge of use of DME;Decreased safety awareness;Pain;Cardiopulmonary status limiting activity PT Treatment Interventions: DME instruction;Gait training;Stair training;Functional mobility training;Therapeutic activities;Therapeutic exercise;Neuromuscular re-education;Patient/family education     PT Goals(Current goals can be found in the care plan section) Acute Rehab PT Goals Patient Stated Goal: To d/c to SNF for STR PT Goal Formulation: With patient Time For Goal Achievement:  03/11/13 Potential to Achieve Goals: Fair  Visit Information  Last PT Received On: 03/04/13 Assistance Needed: +  1 History of Present Illness: Pt is a 62 y/o female admitted s/p COPD exacerbation. Pt was recently treated for a COPD exacerbation and d/c'd home last week.       Prior Functioning  Home Living Family/patient expects to be discharged to:: Skilled nursing facility Living Arrangements: Alone Prior Function Level of Independence: Needs assistance Gait / Transfers Assistance Needed: Uses 4-wheeled walker when leaves the apt ADL's / Homemaking Assistance Needed: Aide that comes to help with bathing Communication Communication: No difficulties Dominant Hand: Right    Cognition  Cognition Arousal/Alertness: Awake/alert Behavior During Therapy: WFL for tasks assessed/performed Overall Cognitive Status: Within Functional Limits for tasks assessed    Extremity/Trunk Assessment Upper Extremity Assessment Upper Extremity Assessment: Defer to OT evaluation Lower Extremity Assessment Lower Extremity Assessment: Generalized weakness Cervical / Trunk Assessment Cervical / Trunk Assessment: Kyphotic   Balance Balance Balance Assessed: Yes Static Sitting Balance Static Sitting - Balance Support: Feet supported;No upper extremity supported Static Sitting - Level of Assistance: 5: Stand by assistance Static Standing Balance Static Standing - Balance Support: Bilateral upper extremity supported Static Standing - Level of Assistance: 5: Stand by assistance (Min guard)  End of Session PT - End of Session Equipment Utilized During Treatment: Gait belt;Oxygen Activity Tolerance: Patient tolerated treatment well (Limited by reported SOB) Patient left: in chair;with call bell/phone within reach;Other (comment) (MD present in room) Nurse Communication: Mobility status  GP     Ruthann Cancer 03/04/2013, 10:36 AM  Ruthann Cancer, PT, DPT [phone]

## 2013-03-04 NOTE — Progress Notes (Addendum)
Subjective: Patient seen and examined at the bedside. Her shortness of breath is improving. Patient reports she has a bed at a SNF but cannot go until Wednesday per social work.  Objective: Vital signs in last 24 hours: Filed Vitals:   03/03/13 1418 03/03/13 2106 03/04/13 0543 03/04/13 0950  BP: 158/83 136/68 146/69   Pulse: 98 92 71   Temp: 98.6 F (37 C) 98 F (36.7 C) 97.9 F (36.6 C)   TempSrc:  Axillary Oral   Resp: 18 18 16    SpO2: 97% 97% 98% 96%   Weight change:   Intake/Output Summary (Last 24 hours) at 03/04/13 1029 Last data filed at 03/04/13 0545  Gross per 24 hour  Intake    420 ml  Output      0 ml  Net    420 ml   Vitals reviewed. General: Sitting in bed, in NAD HEENT: no scleral icterus, wearing glasses, MMM Cardiac: RRR, no rubs, murmurs or gallops Pulm: clear to auscultation bilaterally with expiratory wheezing diffusely Abd: soft, nontender, nondistended, BS present Ext: warm and well perfused, no pedal edema Neuro: alert and oriented X3, moves all extremities voluntarily  Lab Results: Basic Metabolic Panel:  Recent Labs Lab 02/27/13 1725 03/02/13 0946  NA 129* 138  K 4.1 3.6  CL 90* 94*  CO2 29 32  GLUCOSE 99 105*  BUN 7 8  CREATININE 0.33* 0.34*  CALCIUM 7.7* 8.3*   Liver Function Tests:  Recent Labs Lab 03/02/13 0946  AST 27  ALT 33  ALKPHOS 106  BILITOT 0.1*  PROT 6.7  ALBUMIN 3.8   CBC:  Recent Labs Lab 02/27/13 1725 03/02/13 0946  WBC 6.9 11.6*  HGB 10.9* 11.3*  HCT 32.2* 33.9*  MCV 104.2* 104.6*  PLT 330 341   Fasting Lipid Panel:  Recent Labs Lab 02/26/13 1155  CHOL 184  HDL 91  LDLCALC 76  TRIG 83  CHOLHDL 2.0   Thyroid Function Tests:  Recent Labs Lab 02/26/13 1155  TSH 0.508   Urine Drug Screen: Drugs of Abuse     Component Value Date/Time   LABOPIA NONE DETECTED 01/10/2012 1339   COCAINSCRNUR NONE DETECTED 01/10/2012 1339   LABBENZ NONE DETECTED 01/10/2012 1339   AMPHETMU NONE  DETECTED 01/10/2012 1339   THCU NONE DETECTED 01/10/2012 1339   LABBARB NONE DETECTED 01/10/2012 1339     Studies/Results: No results found. Medications: I have reviewed the patient's current medications. Scheduled Meds: . albuterol  2.5 mg Nebulization Q4H  . azithromycin  250 mg Oral Daily  . cholecalciferol  1,000 Units Oral Daily  . DULoxetine  30 mg Oral Daily  . DULoxetine  60 mg Oral QHS  . enoxaparin (LOVENOX) injection  30 mg Subcutaneous Q24H  . feeding supplement (ENSURE COMPLETE)  237 mL Oral TID BM  . fentaNYL  75 mcg Transdermal Q72H  . lamoTRIgine  100 mg Oral q morning - 10a  . lamoTRIgine  200 mg Oral QPM  . metoCLOPramide  10 mg Oral QHS  . mometasone-formoterol  2 puff Inhalation BID  . OLANZapine  5 mg Oral QHS  . pantoprazole  40 mg Oral Daily  . predniSONE  20 mg Oral Q breakfast  . tapentadol  100 mg Oral QID  . tiotropium  18 mcg Inhalation Daily   Continuous Infusions:  PRN Meds:.acetaminophen, albuterol, ALPRAZolam, ondansetron, oxyCODONE, zolpidem Assessment/Plan: This is is a 38 female with PMH of COPD (GOLD IV on 2-3L O2 at home), depression, chronic  pain, bipolar disorder, chronic N/V (unknown etiology, followed by GI) who presents w/ an episode of shortness of breath at home , which started on the morning of admission.   COPD exacerbation: She had not picked up her prednisone or azithromycin after her discharge from the hospital for her COPD exacerbation. She continues to have a cough mildly productive of yellow sputum and episodes of shortness of breath. She has experienced significant anxiety with air hunger episodes and reports "being very scared followed by feeling short of breath". Although she has refused SNF placement in the past, she is now very interested in being in a facility as she requires assistance for her ADLs. She understands she has severe COPD. CXR with no acute changes, she is afebrile, with no leucocytosis, making PNA less likely.    - CSW actively working on SNF placement, medically stable for discharge when bed available - Continue with home oxygen 2-3 L/min  - Xanax 0.25mg  TID PRN for air hunger/anxiety - Resume antibiotic treatment with azithromycin, will likely need 4 more days for a total of 5 day therapy.  - DuoNebs with Xopenex q4hours, albuterol inhaler q6hr PRN for wheezing/shortness of breath.  - Prednisone 20mg  daily, would benefit from 4 more days of tx - incentive spirometry, flutter valve - Nutrition consult > Severe malnutrition in the context of chronic illness, Encourage intake at meals and snacks., Ensure Complete po TID, each supplement provides 350 kcal and 13 grams of protein  Mental health disorders: Patient carries diagnoses of anxiety, depression, bipolar disorder. She is taking Lamictal, olanzapine, Cymbalta at home. No suicidal homicidal ideation.  -will continue home medications   Macrocytic anemia: Vitamin B12 and folic acid level were normal in the past on 11/30/12. Her medications such as Lamictal and olanzapine may have partially contributed. Hgb is stable 11.8 on 04/30/12 to 10.9 presentation with no signs of bleeding.   -f/u CBC as outpatient   Chronic nausea and vomiting: Unclear etiology, per GI, Dr Jarold Motto, patient's symptoms are most likely secondary to her severe COPD. Symptoms are controlled by Zofran and Reglan at home.  -will continue Zofran and Reglan   Chronic Pain: Chronic back pain, secondary to "sciatica". Continue with home medications including fentanyl patch (though she has very little adipose tissue with likely not much benefit from this medication), oxycodone, and Tapentadol.   DVT prophylaxis: Heparin SQ  Diet: regular   Dispo: Disposition is deferred at this time, awaiting improvement of current medical problems.  Anticipated discharge in approximately 1-2 day(s).   The patient does have a current PCP Windell Hummingbird, MD) and does need an [facility] hospital follow-up  appointment after discharge.  The patient does have transportation limitations that hinder transportation to clinic appointments.  .Services Needed at time of discharge: Y = Yes, Blank = No PT:   OT:   RN:   Equipment:   Other:     LOS: 2 days   Vivi Barrack, MD 03/04/2013, 10:29 AM

## 2013-03-04 NOTE — Progress Notes (Signed)
INITIAL NUTRITION ASSESSMENT  DOCUMENTATION CODES Per approved criteria  -Severe malnutrition in the context of chronic illness -Underweight   INTERVENTION:  1. Encourage intake at meals and snacks.   2. Ensure Complete po TID, each supplement provides 350 kcal and 13 grams of protein  NUTRITION DIAGNOSIS: Malnutrition related to chronic disease as evidenced by severe fat and muscle wasting.   Goal: [name] to meet >/= 90% of their estimated nutrition needs   Monitor:  PO intake, weight trend, labs  Reason for Assessment: consult  62 y.o. female  Admitting Dx: Shortness of breath  ASSESSMENT: [name] with hx of severe COPD admitted acute SOB. [name] was recently discharged for hospital for acute exacerbation of her COPD. She was not taking her steroids and antibiotics at home after last admission. [name]. Lunch is provided to her in the cafeteria M-F. [name] is responsible for all other meals. Breakfast is oatmeal with butter and brown sugar, Dinner is a frozen meal such as stoffers. [name] admits to being too SOB to prepare at least 1 meal per week if not more. [name] does drink 3 ensures every day. [name] had been weighing 80-83 lb until about 6 months ago when she started to gradually lose weight. [name] is now down to 78 lb.  [name] reports that she will be discharged to an ALF where meals will be prepared for her. [name] agrees that this is what will be best for her as she is struggling by herself.   Nutrition Focused Physical Exam:  Subcutaneous Fat:  Orbital Region: severe wasting Upper Arm Region: severe wasting  Thoracic and Lumbar Region: severe wasting  Muscle:  Temple Region: severe wasting Clavicle Bone Region: severe wasting Clavicle and Acromion Bone Region: severe wasting Scapular Bone Region: severe wasting Dorsal Hand: severe wasting Patellar Region: severe wasting Anterior Thigh Region: severe wasting Posterior Calf Region: severe wasting  Edema: not present    Height: Ht Readings from Last 1 Encounters:  02/26/13 5\' 1"  (1.549 m)    Weight: Wt Readings from Last 1 Encounters:  02/26/13 78 lb 3.2 oz (35.471 kg)    Ideal Body Weight: 47.7 kg  % Ideal Body Weight: 74%  Wt Readings from Last 10 Encounters:  02/26/13 78 lb 3.2 oz (35.471 kg)  01/01/13 79 lb (35.834 kg)  11/20/12 81 lb 8 oz (36.968 kg)  11/07/12 84 lb 12.8 oz (38.465 kg)  10/25/12 83 lb 4.8 oz (37.785 kg)  08/21/12 90 lb 3.2 oz (40.914 kg)  08/08/12 86 lb 14.4 oz (39.418 kg)  07/09/12 86 lb 3.2 oz (39.1 kg)  06/19/12 88 lb 4.8 oz (40.053 kg)  06/05/12 91 lb 9.6 oz (41.549 kg)    Usual Body Weight: 80-83 lb   % Usual Body Weight: 94-98%  BMI:  There is no weight on file to calculate BMI.  Estimated Nutritional Needs: Kcal: 1250-1400 Protein: 60-75 grams Fluid: > 1.5 L/day  Skin: no issues noted  Diet Order: General  EDUCATION NEEDS: -No education needs identified at this time   Intake/Output Summary (Last 24 hours) at 03/04/13 0913 Last data filed at 03/04/13 0545  Gross per 24 hour  Intake    420 ml  Output      0 ml  Net    420 ml    Last BM: 12/21   Labs:   Recent Labs Lab 02/27/13 1725 03/02/13 0946  NA 129* 138  K 4.1 3.6  CL 90* 94*  CO2 29 32  BUN 7 8  CREATININE 0.33* 0.34*  CALCIUM 7.7* 8.3*  GLUCOSE 99 105*    CBG (last 3)  No results found for this basename: GLUCAP,  in the last 72 hours  Scheduled Meds: . albuterol  2.5 mg Nebulization Q4H  . azithromycin  250 mg Oral Daily  . cholecalciferol  1,000 Units Oral Daily  . DULoxetine  30 mg Oral Daily  . DULoxetine  60 mg Oral QHS  . enoxaparin (LOVENOX) injection  30 mg Subcutaneous Q24H  . feeding supplement (ENSURE COMPLETE)  237 mL Oral TID BM  . fentaNYL  75 mcg Transdermal Q72H  . lamoTRIgine  100 mg Oral q morning - 10a  . lamoTRIgine  200 mg Oral QPM  . metoCLOPramide  10 mg Oral QHS  . mometasone-formoterol  2 puff Inhalation BID  . OLANZapine  5 mg Oral  QHS  . pantoprazole  40 mg Oral Daily  . pneumococcal 23 valent vaccine  0.5 mL Intramuscular Tomorrow-1000  . predniSONE  20 mg Oral Q breakfast  . tapentadol  100 mg Oral QID  . tiotropium  18 mcg Inhalation Daily    Continuous Infusions:   Past Medical History  Diagnosis Date  . Depression   . Hypertension   . Suicidal ideation 2007     attempted overdose 2012  Dr. Gwendalyn Ege report  . Bipolar disorder   . Substance abuse      narcotics, alcohol, tobacco  . Chronic pain syndrome     follows at pain managment  . DEFICIENCY, VITAMIN D NOS 01/03/2007  . Chronic lower back pain   . Anxiety   . OSTEOPOROSIS 06/17/2009    DEXA 05/2009 : L femur -2.9; R femur -2.5. Alendronate on med list but not taking. Needs addressed ASAP as h/o fractures.    . Sciatic pain   . Anemia   . Elevated liver function tests   . COPD (chronic obstructive pulmonary disease)   . On home oxygen therapy     "2L; 24/7" (02/27/2013)  . Shortness of breath     "all the time" (02/27/2013)  . Arthritis     "legs, back; hips, primarily left hip" (02/27/2013)    Past Surgical History  Procedure Laterality Date  . Orif ankle fracture Left 11/16/2010  . Cesarean section  1974; 1979  . Tubal ligation  1979  . Augmentation mammaplasty  ~ [address], LDN, CNSC [phone] Pager [phone] After Hours Pager

## 2013-03-04 NOTE — Progress Notes (Signed)
  Date: 03/04/2013  Patient name: [name]  Medical record number: [id number]  Date of birth: 22-Apr-1950   This patient has been seen and the plan of care was discussed with the house staff. Please see their note for complete details. I concur with their findings with the following additions/corrections:  [name] is improved today but continues to have episodes of acute breathlessness and panic, xanax is helping.  Apparently, she was not taking medications for acute exacerbation at home (prednisone and azithromycin), so her symptoms could be related to an undertreated exacerbation as well.  The team has restarted these.  We are awaiting SNF placement, CM/SW involved.   Inez Catalina, MD 03/04/2013, 2:05 PM

## 2013-03-04 NOTE — Discharge Summary (Signed)
Name: [name] MRN: [id number] DOB: May 23, 1950 62 y.o. PCP: Windell Hummingbird, MD  Date of Admission: 03/02/2013  9:15 AM Date of Discharge: 03/05/2013 Attending Physician: Inez Catalina, MD  Discharge Diagnosis: Principal Problem:   Shortness of breath Active Problems:   DEPRESSION   BACK PAIN, LUMBAR   OSTEOPOROSIS   BIPOLAR AFFECTIVE DISORDER, DEPRESSED, HX OF   NARCOTIC ABUSE   Bronchitis, allergic   COPD (chronic obstructive pulmonary disease)   Loss of weight   Chronic pain   Chronic respiratory failure   Protein-calorie malnutrition, severe  Discharge Medications:   Medication List         albuterol 108 (90 BASE) MCG/ACT inhaler  Commonly known as:  PROAIR HFA  INHALE 1 PUFF IN MOUTH EVERY2- 4 HOURS AS NEEDED FOR WHEEZING     albuterol (2.5 MG/3ML) 0.083% nebulizer solution  Commonly known as:  PROVENTIL  Take 3 mLs (2.5 mg total) by nebulization every 4 (four) hours as needed. Shortness of breath     ALPRAZolam 0.25 MG tablet  Commonly known as:  XANAX  Take 1 tablet (0.25 mg total) by mouth 2 (two) times daily as needed for anxiety.     AMBULATORY NON FORMULARY MEDICATION  Continuous O2 @@ 2LPM     azithromycin 250 MG tablet  Commonly known as:  ZITHROMAX  Take 1 tablet (250 mg total) by mouth daily.     cholecalciferol 1000 UNITS tablet  Commonly known as:  VITAMIN D  Take 1,000 Units by mouth daily.     DULoxetine 30 MG capsule  Commonly known as:  CYMBALTA  Take 30-60 mg by mouth 2 (two) times daily. Takes 30 mg in a.m. and 60 mg in p.m.     ENSURE HIGH PROTEIN Liqd  Take 1 Can by mouth 3 (three) times daily.     fentaNYL 75 MCG/HR  Commonly known as:  DURAGESIC - dosed mcg/hr  Place 1 patch (75 mcg total) onto the skin every 3 (three) days.     Fluticasone-Salmeterol 250-50 MCG/DOSE Aepb  Commonly known as:  ADVAIR  Inhale 1 puff into the lungs 2 (two) times daily.     lamoTRIgine 100 MG tablet  Commonly known as:  LAMICTAL    Take 100-200 mg by mouth 2 (two) times daily. 1 tablet in the morning and 2 tablets at night     metoCLOPramide 10 MG tablet  Commonly known as:  REGLAN  Take 1 tablet (10 mg total) by mouth at bedtime.     NUCYNTA 100 MG Tabs  Generic drug:  Tapentadol HCl  Take 100 mg by mouth 4 (four) times daily.     OLANZapine 5 MG tablet  Commonly known as:  ZYPREXA  Take 5 mg by mouth at bedtime.     omeprazole 20 MG capsule  Commonly known as:  PRILOSEC  Take 1 capsule (20 mg total) by mouth daily.     ondansetron 4 MG tablet  Commonly known as:  ZOFRAN  Take 1 tablet (4 mg total) by mouth every 8 (eight) hours as needed for nausea.     oxyCODONE 15 MG immediate release tablet  Commonly known as:  ROXICODONE  Take 1 tablet (15 mg total) by mouth every 6 (six) hours as needed for pain.     predniSONE 20 MG tablet  Commonly known as:  DELTASONE  Take 1 tablet (20 mg total) by mouth daily with breakfast.     tiotropium 18 MCG inhalation capsule  Commonly known as:  SPIRIVA  Place 1 capsule (18 mcg total) into inhaler and inhale daily.     zolpidem 5 MG tablet  Commonly known as:  AMBIEN  Take 1 tablet (5 mg total) by mouth at bedtime as needed for sleep.        Disposition and follow-up:   Ms.Eleen H Batterman was discharged from [facility] in Stable condition.  At the hospital follow up visit please address:   1.  Shortness of breath? Pulmonary rehabilitation appointment follow up?  2.  Labs / imaging needed at time of follow-up: CBC  3.  Pending labs/ test needing follow-up: None  Follow-up Appointments: Follow-up Information   Follow up with Christen Bame, MD On 03/15/2013. (At 1:45 PM. This is your primary care clinic.)    Specialty:  Internal Medicine   Contact information:   [address] [place] [address] [phone]       Discharge Instructions: Discharge Orders   Future Appointments Provider Department Dept Phone   03/15/2013 1:45 PM  Christen Bame, MD [facility] [phone]   Future Orders Complete By Expires   Diet - low sodium heart healthy  As directed    Increase activity slowly  As directed       Consultations:  None  Procedures Performed:  Dg Chest 2 View  03/02/2013   CLINICAL DATA:  Shortness of breath.  EXAM: CHEST  2 VIEW  COMPARISON:  Chest x-ray 02/27/2013.  FINDINGS: Marked hyperexpansion of the lungs with severe pruning of the pulmonary vasculature, most pronounced in the upper lungs, compatible with bullous emphysema. Diffuse peribronchial cuffing. No definite acute consolidative airspace disease. No pleural effusions. No definite pneumothorax. No evidence of pulmonary edema. Heart size is normal. Upper mediastinal contours are within normal limits.  IMPRESSION: 1. Chronic changes of advanced bullous emphysema redemonstrated, without definite radiographic evidence of acute cardiopulmonary disease.   Electronically Signed   By: Trudie Reed M.D.   On: 03/02/2013 10:38   Dg Chest Portable 1 View  02/27/2013   CLINICAL DATA:  Difficulty breathing  EXAM: PORTABLE CHEST - 1 VIEW  COMPARISON:  Portable exam 1837 hr compared to 05/07/2012  FINDINGS: Normal heart size, mediastinal contours, and pulmonary vascularity.  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  Mild scattered interstitial prominence at the lower lungs, likely unchanged accounting for differences in technique.  No acute infiltrate, pleural effusion or pneumothorax.  No acute osseous findings.  IMPRESSION: COPD changes.  No acute abnormalities.   Electronically Signed   By: Ulyses Southward M.D.   On: 02/27/2013 18:57     Admission HPI:  ERCELL Mcclure is a 62 y.o. female with PMH of COPD (GOLD IV on 2-3L O2 at home), depression, chronic pain, bipolar disorder, chronic N/V (unknown etiology, followed by GI) who presents w/ an episode of shortness of breath at home , which started on the morning  of admission.  She reports that she suddenly developed shortness of breath shortly after she woke up today. She felt scared and started breathing heavily. EMS was called and she received several breathing treatments enroute. Her breathing significantly improved soon after she arrived in the emergency department. She denies intercurrent fevers, increased sputum production, increased cough, wheezing, chills, nausea, or vomiting. She reports good compliance with her home medications for COPD including albuterol nebulizer, Advair, Spiriva, and albuterol inhalers. During our evaluation in the ED she stated "please if you  let me go home, you will have to signed my death certificate". The patient lives alone but she has a home aide, who comes to the house 7 days a week and spends a few hours/day. She requires assistance with ADLs. She reports that she can no longer live alone. Patient's sister was present at bedside and expressed the same concerns about her safety at home. Sister also requested that Ms. Hitch is placed in a short-term rehabilitation, where it will be safer for her.  Of note, patient had been evaluated in the ED at least twice over the past week, with a very similar presentation. During her evaluation on 03/01/2013, she was discharged from the emergency department, as it was determined that her symptoms were not due to COPD exacerbation, but instead severe anxiety that worsens her COPD symptoms/SOB. It was also felt that her COPD has progressed and the plan was referral to pulmonary rehabilitation. Patient was not interested in skilled nursing home placement at that time.  Physical Exam:  Filed Vitals:    03/02/13 2221   BP:  128/64   Pulse:  92   Temp:  98.3 F (36.8 C)   Resp:  16    General: Cachectic, alert, cooperative, and in no apparent distress; speaking in complete sentences, sister and home aid at bedside  HEENT: pupils equal round and reactive to light, vision grossly intact,  oropharynx clear and non-erythematous, MMM  Neck: supple, no lymphadenopathy Lungs: Chest exam is clear with very minimal wheezing.  Abdomen: non-tender, non-distended, normal bowel sounds  Extremities: warm extremities, no BLE edema Neurologic: alert & oriented X3, cranial nerves II-XII grossly intact, strength grossly intact, sensation intact to light touch   Hospital Course by problem list: This is is a 65 female with PMH of COPD (GOLD IV on 2-3L O2 at home), depression, chronic pain, bipolar disorder, chronic N/V (unknown etiology, followed by GI) who presents w/ an episode of shortness of breath at home , which started on the morning of admission.   1. Acute COPD exacerbation - She had not picked up her prednisone or azithromycin after her discharge from the hospital last week. She continues to have a cough mildly productive of yellow sputum and episodes of shortness of breath. She has also experienced significant anxiety with air hunger episodes and reports "being very scared followed by feeling short of breath". Although she has refused SNF placement in the past, she is now interested in being in a facility as she requires assistance for her ADLs. She understands she has severe COPD. CXR with no acute changes, she is afebrile, with no leucocytosis, making PNA less likely. We continued with home oxygen 2-3 L/min. We have prescribed Xanax 0.25mg  TID PRN for air hunger/anxiety and will send her to SNF with this prescription. We resumed antibiotic treatment with azithromycin, she will likely need 3 more days of this for a total of 5 day therapy. We gave DuoNebs with Xopenex q4hours, albuterol inhaler q6hr PRN for wheezing/shortness of breath. We continued Prednisone 20mg  daily, she would benefit from 3 more days of treatment.  2. Severe malnutrition in the context of chronic illness - Patient weighs 78lbs. Nutrition consult was placed. Recommended encouraging intake at meals and snacks and  supplementing with Ensure Complete po TID, each supplement provides 350 kcal and 13 grams of protein.  3. Mental health disorders - Patient carries diagnoses of anxiety, depression, bipolar disorder. She is taking Lamictal, olanzapine, Cymbalta at home. No suicidal homicidal ideation. We continued  her home medications.  4. Macrocytic anemia - Vitamin B12 and folic acid level were normal in the past on 11/30/12. Her medications such as Lamictal and olanzapine may have partially contributed. Hgb is stable 11.8 on 04/30/12 to 10.9 presentation with no signs of bleeding. We may follow up a CBC as outpatient.  5. Chronic nausea and vomiting - Unclear etiology, per GI, Dr Jarold Motto, patient's symptoms are most likely secondary to her severe COPD. Symptoms are controlled by Zofran and Reglan at home. We continued Zofran and Reglan.  6. Chronic Pain: Chronic back pain, secondary to "sciatica". Continue with home medications including fentanyl patch (though she has very little adipose tissue with likely not much benefit from this medication), oxycodone, and Tapentadol.    Discharge Vitals:   BP 132/76  Pulse 95  Temp(Src) 98.7 F (37.1 C) (Oral)  Resp 18  Ht 5\' 1"  (1.549 m)  SpO2 98%  Discharge Labs:  BMET    Component Value Date/Time   NA 138 03/02/2013 0946   K 3.6 03/02/2013 0946   CL 94* 03/02/2013 0946   CO2 32 03/02/2013 0946   GLUCOSE 105* 03/02/2013 0946   BUN 8 03/02/2013 0946   CREATININE 0.34* 03/02/2013 0946   CREATININE 0.65 11/30/2012 1303   CALCIUM 8.3* 03/02/2013 0946   GFRNONAA >90 03/02/2013 0946   GFRAA >90 03/02/2013 0946    CBC    Component Value Date/Time   WBC 11.6* 03/02/2013 0946   RBC 3.24* 03/02/2013 0946   HGB 11.3* 03/02/2013 0946   HCT 33.9* 03/02/2013 0946   PLT 341 03/02/2013 0946   MCV 104.6* 03/02/2013 0946   MCH 34.9* 03/02/2013 0946   MCHC 33.3 03/02/2013 0946   RDW 11.9 03/02/2013 0946   LYMPHSABS 3.0 04/30/2012 1431   MONOABS 0.7 04/30/2012  1431   EOSABS 0.2 04/30/2012 1431   BASOSABS 0.0 04/30/2012 1431     Signed: Rocco Serene, MD 03/05/2013, 10:49 AM   Time Spent on Discharge: 40 minutes Services Ordered on Discharge: None Equipment Ordered on Discharge: None

## 2013-03-04 NOTE — Evaluation (Signed)
Occupational Therapy Evaluation Patient Details Name: [name] MRN: [id number] DOB: [birth year] Today's Date: 03/04/2013 Time: 7829-5621 OT Time Calculation (min): 22 min  OT Assessment / Plan / Recommendation History of present illness Pt is a 62 y/o female admitted s/p COPD exacerbation. Pt was recently treated for a COPD exacerbation and d/c'd home last week.   Clinical Impression   Pt presents with below problem list. Pt will benefit from acute OT to increase independence prior to d/c. Pt had a few "attacks" and used inhaler during session (nurse notified)-educated on deep breathing.    OT Assessment  Patient needs continued OT Services    Follow Up Recommendations  SNF;Supervision/Assistance - 24 hour    Barriers to Discharge Decreased caregiver support    Equipment Recommendations  Other (comment) (defer to next venue)    Recommendations for Other Services    Frequency  Min 2X/week    Precautions / Restrictions Precautions Precautions: Fall Restrictions Weight Bearing Restrictions: No   Pertinent Vitals/Pain Pain 10/10 in back. Nurse notified.     ADL  Grooming: Teeth care;Set up;Supervision/safety Where Assessed - Grooming: Supported standing Lower Body Dressing: Min guard Where Assessed - Lower Body Dressing: Supported sit to stand Toilet Transfer: Personnel officer Method: Sit to Barista: Comfort height toilet;Regular height toilet;Grab bars Toileting - Architect and Hygiene: Min guard Where Assessed - Engineer, mining and Hygiene: Sit to stand from 3-in-1 or toilet Tub/Shower Transfer Method: Not assessed Equipment Used: Other (comment);Gait belt (oxygen; rollator) Transfers/Ambulation Related to ADLs: Min guard for ambulation-cues to be sure to lock brakes and Min guard/Supervision for transfers. ADL Comments: Educated on energy conservation techniques. Educated on using two  cups for teeth care to help prevent bending as pt has back pain. Educated on use of reacher to help with LB dressing as well as to cross legs over knees to help avoid bending and help with back pain.      OT Diagnosis: Acute pain;Other (comment) (Cardiopulmonary status limiting activity)  OT Problem List: Decreased strength;Decreased activity tolerance;Impaired balance (sitting and/or standing);Decreased knowledge of use of DME or AE;Decreased knowledge of precautions;Pain;Cardiopulmonary status limiting activity OT Treatment Interventions: Self-care/ADL training;DME and/or AE instruction;Therapeutic activities;Patient/family education;Balance training;Energy conservation   OT Goals(Current goals can be found in the care plan section) Acute Rehab OT Goals Patient Stated Goal: not stated OT Goal Formulation: With patient Time For Goal Achievement: 03/11/13 Potential to Achieve Goals: Good ADL Goals Pt Will Perform Grooming: with modified independence;standing Pt Will Perform Upper Body Bathing: with modified independence;sitting Pt Will Perform Lower Body Bathing: with modified independence;sit to/from stand Pt Will Perform Lower Body Dressing: with modified independence;sit to/from stand Pt Will Transfer to Toilet: with modified independence;ambulating (3 in 1 over commode) Pt Will Perform Toileting - Clothing Manipulation and hygiene: with modified independence;sit to/from stand Additional ADL Goal #1: Pt will independently verbalize and demonstrate 3/3 energy conservation techniques during ADLs.   Visit Information  Last OT Received On: 03/04/13 Assistance Needed: +1 History of Present Illness: Pt is a 62 y/o female admitted s/p COPD exacerbation. Pt was recently treated for a COPD exacerbation and d/c'd home last week.       Prior Functioning     Home Living Family/patient expects to be discharged to:: Assisted living Living Arrangements: Alone Additional Comments: pt has regular  commode and tub/shower at home Prior Function Level of Independence: Needs assistance Gait / Transfers Assistance Needed: Uses 4-wheeled walker when leaves the apt  ADL's / Homemaking Assistance Needed: Aide that comes to assist pt in/out of shower and also cleans Communication Communication: No difficulties Dominant Hand: Right         Vision/Perception     Cognition  Cognition Arousal/Alertness: Awake/alert Behavior During Therapy: WFL for tasks assessed/performed Overall Cognitive Status: Within Functional Limits for tasks assessed    Extremity/Trunk Assessment Upper Extremity Assessment Upper Extremity Assessment: Overall WFL for tasks assessed Lower Extremity Assessment Lower Extremity Assessment: Defer to PT evaluation    Mobility Bed Mobility Bed Mobility: Not assessed Transfers Transfers: Sit to Stand;Stand to Sit Sit to Stand: 4: Min guard;5: Supervision;With upper extremity assist;From chair/3-in-1;From toilet Stand to Sit: 4: Min guard;5: Supervision;To chair/3-in-1;To toilet Details for Transfer Assistance: Cues for technique.            End of Session OT - End of Session Equipment Utilized During Treatment: Gait belt;Other (comment);Oxygen (rollator) Activity Tolerance: Patient tolerated treatment well Patient left: in chair;with call bell/phone within reach  GO     Earlie Raveling OTR/L [phone] 03/04/2013, 11:35 AM

## 2013-03-05 DIAGNOSIS — R112 Nausea with vomiting, unspecified: Secondary | ICD-10-CM

## 2013-03-05 DIAGNOSIS — E41 Nutritional marasmus: Secondary | ICD-10-CM

## 2013-03-05 MED ORDER — IBUPROFEN 400 MG PO TABS
400.0000 mg | ORAL_TABLET | Freq: Once | ORAL | Status: AC
  Administered 2013-03-05: 400 mg via ORAL
  Filled 2013-03-05: qty 1

## 2013-03-05 NOTE — Discharge Summary (Signed)
I saw [name] on day of discharge and agree with discharge plan.

## 2013-03-05 NOTE — Progress Notes (Signed)
Subjective: [name] is doing well this morning, states she feels the best she has in a long time.  Her only complaint is frontal headache which she reports she has had for days and does not respond to Tylenol.  Gave ibuprofen 400 mg once and headache resolved.   Objective: Vital signs in last 24 hours: Filed Vitals:   03/04/13 2214 03/05/13 0413 03/05/13 0712 03/05/13 1228  BP: 136/75  132/76   Pulse: 92  95 90  Temp: 98.9 F (37.2 C)  98.7 F (37.1 C)   TempSrc:      Resp: 18  18 16   Height:      SpO2:  97% 98% 97%   Weight change:   Intake/Output Summary (Last 24 hours) at 03/05/13 1323 Last data filed at 03/05/13 0813  Gross per 24 hour  Intake    880 ml  Output      0 ml  Net    880 ml   Vitals reviewed. General: Sitting in bed, NAD HEENT: NCAT, no scleral icterus Cardiac: RRR, no rubs, murmurs or gallops Pulm: clear to auscultation bilaterally with expiratory wheezing diffusely Abd: soft, nontender, nondistended, BS present Ext: warm and well perfused, no pedal edema Neuro: alert and oriented X3, moves all extremities voluntarily  Lab Results: Basic Metabolic Panel:  Recent Labs Lab 02/27/13 1725 03/02/13 0946  NA 129* 138  K 4.1 3.6  CL 90* 94*  CO2 29 32  GLUCOSE 99 105*  BUN 7 8  CREATININE 0.33* 0.34*  CALCIUM 7.7* 8.3*   Liver Function Tests:  Recent Labs Lab 03/02/13 0946  AST 27  ALT 33  ALKPHOS 106  BILITOT 0.1*  PROT 6.7  ALBUMIN 3.8   CBC:  Recent Labs Lab 02/27/13 1725 03/02/13 0946  WBC 6.9 11.6*  HGB 10.9* 11.3*  HCT 32.2* 33.9*  MCV 104.2* 104.6*  PLT 330 341    Medications: I have reviewed the patient's current medications. Scheduled Meds: . albuterol  2.5 mg Nebulization Q4H  . azithromycin  250 mg Oral Daily  . cholecalciferol  1,000 Units Oral Daily  . DULoxetine  30 mg Oral Daily  . DULoxetine  60 mg Oral QHS  . enoxaparin (LOVENOX) injection  30 mg Subcutaneous Q24H  . feeding supplement (ENSURE  COMPLETE)  237 mL Oral TID BM  . fentaNYL  75 mcg Transdermal Q72H  . lamoTRIgine  100 mg Oral q morning - 10a  . lamoTRIgine  200 mg Oral QPM  . metoCLOPramide  10 mg Oral QHS  . mometasone-formoterol  2 puff Inhalation BID  . OLANZapine  5 mg Oral QHS  . pantoprazole  40 mg Oral Daily  . predniSONE  20 mg Oral Q breakfast  . tapentadol  100 mg Oral QID  . tiotropium  18 mcg Inhalation Daily   PRN Meds:.acetaminophen, albuterol, ALPRAZolam, ondansetron, oxyCODONE, zolpidem  Assessment/Plan: 62 y/o woman with PMH COPD (GOLD IV on 2-3L O2 at home), chronic pain, bipolar disorder, depression, chronic N/V (unknown etiology, followed by GI) who presents with shortness of breath which started on the morning of admission.   COPD exacerbation: She had not picked up her prednisone or azithromycin after her discharge from the hospital for her COPD exacerbation. She continues to have a cough mildly productive of yellow sputum and episodes of shortness of breath. She has experienced significant anxiety with air hunger episodes and reports "being very scared followed by feeling short of breath."  Although she has refused SNF  placement in the past, she is now interested in being in a facility as she requires assistance for her ADLs.  She understands she has severe COPD.  CXR with no acute changes, she is afebrile, with no leucocytosis, making PNA less likely.  -SNF today ([facility]) - Azithromycin for 5 days total  - Prednisone 20mg  daily fir 5 days total - DuoNebs with Xopenex q4hours, albuterol inhaler q6hr PRN for wheezing/shortness of breath - Continue 2-3 L/min O2  - Xanax 0.25mg  TID PRN for air hunger/anxiety - Nutrition consult- Severe malnutrition in the context of chronic illness, Ensure Complete PO TID  Mental health disorders: Patient carries diagnoses of anxiety, depression, bipolar disorder. She is taking Lamictal, olanzapine, Cymbalta at home. No SI/HI.  -continue home medications    Macrocytic anemia: Vitamin B12 and folic acid level were normal in the past on 11/30/12. Her medications such as Lamictal and olanzapine may have partially contributed. Hgb is stable (11.8 on 04/30/12 to 10.9 presentation), no signs of bleeding.   -f/u CBC as outpatient   Chronic nausea and vomiting: Unclear etiology, per GI, Dr. Jarold Motto, patient's symptoms are most likely secondary to her severe COPD. Symptoms are controlled by Zofran and Reglan at home.  -continue Zofran and Reglan   Chronic Pain: Chronic back pain, secondary to "sciatica". Continue with home medications including fentanyl patch (though she has very little adipose tissue with likely not much benefit from this medication), oxycodone, and Tapentadol.   DVT prophylaxis: Heparin SQ TID  Dispo: Anticipated discharge today.   The patient does have a current PCP Windell Hummingbird, MD) and does need an [facility] hospital follow-up appointment after discharge.  The patient does have transportation limitations that hinder transportation to clinic appointments.  .Services Needed at time of discharge: Y = Yes, Blank = No PT:   OT:   RN:   Equipment:   Other:     LOS: 3 days   Rocco Serene, MD 03/05/2013, 1:23 PM

## 2013-03-05 NOTE — Progress Notes (Signed)
CSW (Clinical Child psychotherapist) prepared pt dc packet and placed with shadow chart. CSW arranged non-emergent ambulance transport. Pt, pt nurse, and facility informed. Pt belongings to be transported to facility tomorrow by CSW supervisor. Pt is aware and was agreeable to this plan.  CSW signing off.  Excell Neyland, LCSWA [phone]

## 2013-03-05 NOTE — Progress Notes (Signed)
Physical Therapy Treatment Patient Details Name: [name] MRN: [id number] DOB: 03/07/1951 Today's Date: 03/05/2013 Time: 4098-1191 PT Time Calculation (min): 23 min  PT Assessment / Plan / Recommendation  History of Present Illness Pt is a 62 y/o female admitted s/p COPD exacerbation. Pt was recently treated for a COPD exacerbation and d/c'd home last week.   PT Comments   Pt progressing towards physical therapy goals, with good rehab effort today. Pt was able to improve gait distance, and tolerate increased therapeutic exercise.   Follow Up Recommendations  SNF     Does the patient have the potential to tolerate intense rehabilitation     Barriers to Discharge        Equipment Recommendations  Other (comment) (Equipment needs TBD by next venue of care)    Recommendations for Other Services    Frequency Min 3X/week   Progress towards PT Goals Progress towards PT goals: Progressing toward goals  Plan Current plan remains appropriate    Precautions / Restrictions Precautions Precautions: Fall Restrictions Weight Bearing Restrictions: No   Pertinent Vitals/Pain Pt reports pain in her back due to sciatica during some exercise, however this was improved with positioning.    Mobility  Bed Mobility Bed Mobility: Not assessed Details for Bed Mobility Assistance: Pt sitting up in chair upon arrival. Transfers Transfers: Sit to Stand;Stand to Sit Sit to Stand: 5: Supervision;From chair/3-in-1;With upper extremity assist Stand to Sit: 5: Supervision;To chair/3-in-1;With upper extremity assist Details for Transfer Assistance: VC's for hand placement on seated surface and to lock brakes on 4-wheeled walker prior to initiating transfers.  Ambulation/Gait Ambulation/Gait Assistance: 4: Min guard Ambulation Distance (Feet): 40 Feet Assistive device: 4-wheeled walker Ambulation/Gait Assistance Details: No physical assist required. On 3L/min supplemental O2 throughout gait  training. Pt somewhat self-limiting with distance, stating need to go back to the chair due to increased SOB however pt did not appear to be in any increased respiratory distress.  Gait Pattern: Step-through pattern;Decreased stride length;Shuffle Gait velocity: Decreased Stairs: No Wheelchair Mobility Wheelchair Mobility: No    Exercises Total Joint Exercises Ankle Circles/Pumps: 10 reps Quad Sets: 10 reps Gluteal Sets: 10 reps Towel Squeeze: 10 reps Hip ABduction/ADduction: 10 reps Long Arc Quad: 10 reps   PT Diagnosis:    PT Problem List:   PT Treatment Interventions:     PT Goals (current goals can now be found in the care plan section) Acute Rehab PT Goals Patient Stated Goal: To d/c to SNF for STR PT Goal Formulation: With patient Time For Goal Achievement: 03/11/13 Potential to Achieve Goals: Fair  Visit Information  Last PT Received On: 03/05/13 Assistance Needed: +1 History of Present Illness: Pt is a 62 y/o female admitted s/p COPD exacerbation. Pt was recently treated for a COPD exacerbation and d/c'd home last week.    Subjective Data  Subjective: "I haven't had an episode today, I feel much better." Patient Stated Goal: To d/c to SNF for STR   Cognition  Cognition Arousal/Alertness: Awake/alert Behavior During Therapy: WFL for tasks assessed/performed Overall Cognitive Status: Within Functional Limits for tasks assessed    Balance  Balance Balance Assessed: Yes Static Sitting Balance Static Sitting - Balance Support: Feet supported;No upper extremity supported Static Sitting - Level of Assistance: 5: Stand by assistance Static Standing Balance Static Standing - Balance Support: Bilateral upper extremity supported Static Standing - Level of Assistance: 5: Stand by assistance  End of Session PT - End of Session Equipment Utilized During Treatment: Gait belt;Oxygen  Activity Tolerance: Patient tolerated treatment well Patient left: in chair;with call  bell/phone within reach;Other (comment) Nurse Communication: Mobility status   GP     Ruthann Cancer 03/05/2013, 11:45 AM  Ruthann Cancer, PT, DPT [phone]

## 2013-03-05 NOTE — Discharge Summary (Signed)
I saw [name] on day of discharge and agree with the plan for COPD exacerbation therapy.

## 2013-03-06 ENCOUNTER — Non-Acute Institutional Stay (SKILLED_NURSING_FACILITY): Payer: Medicare Other | Admitting: Adult Health

## 2013-03-06 ENCOUNTER — Other Ambulatory Visit: Payer: Self-pay

## 2013-03-06 ENCOUNTER — Encounter: Payer: Self-pay | Admitting: Adult Health

## 2013-03-06 DIAGNOSIS — E43 Unspecified severe protein-calorie malnutrition: Secondary | ICD-10-CM

## 2013-03-06 DIAGNOSIS — G8929 Other chronic pain: Secondary | ICD-10-CM

## 2013-03-06 DIAGNOSIS — G4700 Insomnia, unspecified: Secondary | ICD-10-CM

## 2013-03-06 DIAGNOSIS — F3160 Bipolar disorder, current episode mixed, unspecified: Secondary | ICD-10-CM | POA: Insufficient documentation

## 2013-03-06 DIAGNOSIS — M545 Low back pain: Secondary | ICD-10-CM

## 2013-03-06 DIAGNOSIS — K219 Gastro-esophageal reflux disease without esophagitis: Secondary | ICD-10-CM

## 2013-03-06 DIAGNOSIS — J449 Chronic obstructive pulmonary disease, unspecified: Secondary | ICD-10-CM

## 2013-03-06 DIAGNOSIS — J9610 Chronic respiratory failure, unspecified whether with hypoxia or hypercapnia: Secondary | ICD-10-CM

## 2013-03-06 MED ORDER — ALPRAZOLAM 0.25 MG PO TABS: 0.2500 mg | ORAL_TABLET | Freq: Two times a day (BID) | ORAL | Status: DC | PRN

## 2013-03-06 MED ORDER — IPRATROPIUM-ALBUTEROL 0.5-2.5 (3) MG/3ML IN SOLN: 3.0000 mL | Freq: Four times a day (QID) | RESPIRATORY_TRACT | Status: DC

## 2013-03-06 MED ORDER — GUAIFENESIN ER 600 MG PO TB12: 1200.0000 mg | ORAL_TABLET | Freq: Two times a day (BID) | ORAL | Status: DC

## 2013-03-06 NOTE — Progress Notes (Signed)
Patient ID: [name], female   DOB: 1951-01-30, 62 y.o.   MRN: [id number]     Allergies  Allergen Reactions  . Banana Nausea And Vomiting  . Pollen Extract      Chief Complaint  Patient presents with  . Hospitalization Follow-up    HPI:  She has recently been hospitalized for her increased level of anxiety and copd. She stated that she felt so short of breath that she felt like she was going to die. There was no definite exacerbation present in the hospital. She is here for short term rehab with her goal to return home.   Past Medical History  Diagnosis Date  . Depression   . Suicidal ideation 2007     attempted overdose 2012  Dr. Gwendalyn Ege report  . Bipolar disorder   . Substance abuse      narcotics, alcohol, tobacco  . Chronic pain syndrome     follows at pain managment  . DEFICIENCY, VITAMIN D NOS 01/03/2007  . Chronic lower back pain   . Anxiety   . OSTEOPOROSIS 06/17/2009    DEXA 05/2009 : L femur -2.9; R femur -2.5. Alendronate on med list but not taking. Needs addressed ASAP as h/o fractures.    . Elevated liver function tests   . On home oxygen therapy     "2L; 24/7" (02/27/2013)  . Shortness of breath     "all the time" (02/27/2013)  . COPD (chronic obstructive pulmonary disease)     oxygen dependent  . Sciatic pain   . Arthritis     "legs, back; hips, primarily left hip" (02/27/2013)  . Anemia     Past Surgical History  Procedure Laterality Date  . Orif ankle fracture Left 11/16/2010  . Cesarean section  1974; 1979  . Tubal ligation  1979  . Augmentation mammaplasty  ~ 2007    VITAL SIGNS BP 137/72  Pulse 60  Ht 5\' 1"  (1.549 m)  Wt 80 lb 3.2 oz (36.378 kg)  BMI 15.16 kg/m2   Patient's Medications  New Prescriptions   No medications on file  Previous Medications   ALBUTEROL (PROAIR HFA) 108 (90 BASE) MCG/ACT INHALER    INHALE 1 PUFF IN MOUTH EVERY2- 4 HOURS AS NEEDED FOR WHEEZING   ALBUTEROL (PROVENTIL) (2.5 MG/3ML) 0.083% NEBULIZER  SOLUTION    Take 3 mLs (2.5 mg total) by nebulization every 4 (four) hours as needed. Shortness of breath   ALPRAZOLAM (XANAX) 0.25 MG TABLET    Take 1 tablet (0.25 mg total) by mouth 2 (two) times daily as needed for anxiety.   AMBULATORY NON FORMULARY MEDICATION    Continuous O2 @@ 2LPM   AZITHROMYCIN (ZITHROMAX) 250 MG TABLET    Take 1 tablet (250 mg total) by mouth daily.   BUDESONIDE-FORMOTEROL (SYMBICORT) 160-4.5 MCG/ACT INHALER    Inhale 2 puffs into the lungs 2 (two) times daily.   CHOLECALCIFEROL (VITAMIN D) 1000 UNITS TABLET    Take 1,000 Units by mouth daily.   DULOXETINE (CYMBALTA) 30 MG CAPSULE    Take 30-60 mg by mouth 2 (two) times daily. Takes 30 mg in a.m. and 60 mg in p.m.   FENTANYL (DURAGESIC - DOSED MCG/HR) 75 MCG/HR    Place 1 patch (75 mcg total) onto the skin every 3 (three) days.   LAMOTRIGINE (LAMICTAL) 100 MG TABLET    Take 100-200 mg by mouth 2 (two) times daily. 1 tablet in the morning and 2 tablets at night   METOCLOPRAMIDE (  REGLAN) 10 MG TABLET    Take 1 tablet (10 mg total) by mouth at bedtime.   NUTRITIONAL SUPPLEMENTS (ENSURE HIGH PROTEIN) LIQD    Take 1 Can by mouth 3 (three) times daily.   OLANZAPINE (ZYPREXA) 5 MG TABLET    Take 5 mg by mouth at bedtime.   OMEPRAZOLE (PRILOSEC) 20 MG CAPSULE    Take 1 capsule (20 mg total) by mouth daily.   ONDANSETRON (ZOFRAN) 4 MG TABLET    Take 1 tablet (4 mg total) by mouth every 8 (eight) hours as needed for nausea.   OXYCODONE (ROXICODONE) 15 MG IMMEDIATE RELEASE TABLET    Take 1 tablet (15 mg total) by mouth every 6 (six) hours as needed for pain.   PREDNISONE (DELTASONE) 20 MG TABLET    Take 1 tablet (20 mg total) by mouth daily with breakfast.   TAPENTADOL HCL (NUCYNTA) 100 MG TABS    Take 100 mg by mouth 4 (four) times daily.   TIOTROPIUM (SPIRIVA) 18 MCG INHALATION CAPSULE    Place 1 capsule (18 mcg total) into inhaler and inhale daily.   ZOLPIDEM (AMBIEN) 5 MG TABLET    Take 1 tablet (5 mg total) by mouth at bedtime  as needed for sleep.  Modified Medications   No medications on file  Discontinued Medications   FLUTICASONE-SALMETEROL (ADVAIR) 250-50 MCG/DOSE AEPB    Inhale 1 puff into the lungs 2 (two) times daily.    SIGNIFICANT DIAGNOSTIC EXAMS  02-27-13: chest x-ray: COPD changes. No acute abnormalities.   02-20-13: chest x-ray: 1. Chronic changes of advanced bullous emphysema redemonstrated, without definite radiographic evidence of acute cardiopulmonary disease.     LABS REVIEWED:   02-26-13: tsh 0.508; chol 184; ldl 76; trig 83 02-27-13: wbc 6.9; hgb 10.9; hct 32.2; mcv 104.2; plt 330; glucose 99; bun 7; creat 0.33; k+4.1; na++129 03-02-13: wbc 11.6; hgb 11.3; hct 33.9; mcv 104.6; plt 341; gucose 105; bun 8; creat 0.34; k+3.6; na++138; liver normal albumin 3.8     Review of Systems  Constitutional: Positive for malaise/fatigue.  Respiratory: Positive for shortness of breath and wheezing. Negative for cough.        Is 02 dependent   Cardiovascular: Negative for chest pain, palpitations and leg swelling.  Gastrointestinal: Negative for heartburn and constipation.  Musculoskeletal: Positive for back pain. Negative for myalgias.       Has chronic back pain is managed   Skin: Negative.   Neurological: Negative for headaches.  Psychiatric/Behavioral: Negative for depression. The patient is nervous/anxious. The patient does not have insomnia.      Physical Exam  Constitutional: She is oriented to person, place, and time. No distress.  cathectic   Neck: Neck supple. No JVD present. No thyromegaly present.  Cardiovascular: Normal rate, regular rhythm and intact distal pulses.   Respiratory: Effort normal. No respiratory distress. She has wheezes.  Has mild expiratory wheezes throughout   GI: Soft. Bowel sounds are normal. She exhibits no distension. There is no tenderness.  Musculoskeletal: Normal range of motion. She exhibits no edema.  Neurological: She is alert and oriented to  person, place, and time.  Skin: Skin is warm and dry. She is not diaphoretic.  Psychiatric: She has a normal mood and affect.    ASSESSMENT/ PLAN:  1. Copd: she is presently stable is 02 dependent; will continue her symbicort 160/4.5 mcg 2 puffs twice daily; spiriva daily; albuterol 2 puffs or neb treatment every 4 hours as needed; she is on long  term prednisone therapy. She is on zithromax 250 mg daily through 03-08-13; will have her complete her abt. Due to her wheezing will being duoneb every 6 hours; mucinex 1200 mg twice daily  and will monitor her status.   2. Bipolar disorder: she is presently emotionally stable will continue xanax 0.25 mg twice daily as needed; this was started in the hospital. Will continue her cymbalta 30 mg in the am and 60 mg in the pm which she also takes for pain management. Will continue her lamictal 100 mg in the am and 200 mg in the pm; will continue her zyprexa 5 mg nightly and will continue to monitor her emotional status.   3. gerd she is stable will continue prilosec 20 mg daily and reglan 10 mg nightly   4. Chronic low back pain: her pain is presently being managed; will continue cymbalta 30 mg in the am and 60 mg in the pm; will continue her duragesic patch 75 mcg every 3 days. She has very little body fat; I am concerned that she may be receiving the full benefit from this medication. Will continue her nucynta 100 mg four times daily; will continue her oxycodone 15 mg every 6 hours as needed will monitor her pain management  5. Severe malnutrition: will continue her current diet orders; will monitor her weight per the facility policy at this time. Will continue her supplements as directed and will monitor her status   6. Insomnia: will continue ambien 5 mg nightly as needed.   Time spent with patient 50 minutes.

## 2013-03-11 ENCOUNTER — Telehealth (HOSPITAL_COMMUNITY): Payer: Self-pay | Admitting: *Deleted

## 2013-03-15 ENCOUNTER — Telehealth (HOSPITAL_COMMUNITY): Payer: Self-pay

## 2013-03-15 ENCOUNTER — Encounter: Payer: Self-pay | Admitting: Internal Medicine

## 2013-03-15 ENCOUNTER — Ambulatory Visit: Payer: Medicare Other | Admitting: Internal Medicine

## 2013-03-15 NOTE — Telephone Encounter (Signed)
I have called and left a message with [facility] to inquire about participation in Pulmonary Rehab. This is the third message left.  We have also sent a letter.

## 2013-03-18 ENCOUNTER — Non-Acute Institutional Stay (SKILLED_NURSING_FACILITY): Payer: Medicare Other | Admitting: Internal Medicine

## 2013-03-18 ENCOUNTER — Encounter: Payer: Self-pay | Admitting: Internal Medicine

## 2013-03-18 DIAGNOSIS — F3160 Bipolar disorder, current episode mixed, unspecified: Secondary | ICD-10-CM

## 2013-03-18 DIAGNOSIS — J9610 Chronic respiratory failure, unspecified whether with hypoxia or hypercapnia: Secondary | ICD-10-CM

## 2013-03-18 DIAGNOSIS — G8929 Other chronic pain: Secondary | ICD-10-CM

## 2013-03-18 DIAGNOSIS — K219 Gastro-esophageal reflux disease without esophagitis: Secondary | ICD-10-CM

## 2013-03-18 DIAGNOSIS — G4700 Insomnia, unspecified: Secondary | ICD-10-CM

## 2013-03-18 DIAGNOSIS — J449 Chronic obstructive pulmonary disease, unspecified: Secondary | ICD-10-CM

## 2013-03-18 NOTE — Progress Notes (Signed)
Patient ID: [name], female   DOB: [birth year], 63 y.o.   MRN: [id number]     [facility]    PCP: Windell Hummingbird, MD  Code Status: dnr  Allergies  Allergen Reactions  . Banana Nausea And Vomiting  . Pollen Extract     Chief Complaint: new admit  HPI:  63 y/o female patient is here for STR after hospital admission with copd exacerbation from 03/02/13- 03/05/13. She is currently stable, seen in her room today and working well with PT and OT. She denies any complaints. No concerns from staff. She has GOLD stage 4 copd and is on 3 l o2 at home.  She also has hx of depression, anxiety, chronic pain among others.   Review of Systems:  Constitutional: Negative for fever, chills, weight loss, malaise/fatigue and diaphoresis.  HENT: Negative for congestion, hearing loss and sore throat.   Eyes: Negative for blurred vision, double vision and discharge.  Respiratory: Negative for cough, sputum production, shortness of breath and wheezing.   Cardiovascular: Negative for chest pain, palpitations, orthopnea and leg swelling.  Gastrointestinal: Negative for heartburn, nausea, vomiting, abdominal pain, diarrhea and constipation.  Genitourinary: Negative for dysuria, urgency, frequency and flank pain.  Musculoskeletal: Negative for back pain, falls, joint pain and myalgias.  Skin: Negative for itching and rash.  Neurological:  Negative for dizziness, tingling, focal weakness and headaches.  Psychiatric/Behavioral: Negative for depression and memory loss. The patient is not nervous/anxious.     Past Medical History  Diagnosis Date  . Depression   . Suicidal ideation 2007     attempted overdose 2012  Dr. Gwendalyn Ege report  . Bipolar disorder   . Substance abuse      narcotics, alcohol, tobacco  . Chronic pain syndrome     follows at pain managment  . DEFICIENCY, VITAMIN D NOS 01/03/2007  . Chronic lower back pain   . Anxiety   . OSTEOPOROSIS 06/17/2009    DEXA 05/2009  : L femur -2.9; R femur -2.5. Alendronate on med list but not taking. Needs addressed ASAP as h/o fractures.    . Elevated liver function tests   . On home oxygen therapy     "2L; 24/7" (02/27/2013)  . Shortness of breath     "all the time" (02/27/2013)  . COPD (chronic obstructive pulmonary disease)     oxygen dependent  . Sciatic pain   . Arthritis     "legs, back; hips, primarily left hip" (02/27/2013)  . Anemia    Past Surgical History  Procedure Laterality Date  . Orif ankle fracture Left 11/16/2010  . Cesarean section  1974; 1979  . Tubal ligation  1979  . Augmentation mammaplasty  ~ 2007   Social History:   reports that she has been smoking Cigarettes.  She has a 8.8 pack-year smoking history. She has never used smokeless tobacco. She reports that she does not drink alcohol or use illicit drugs.  Family History  Problem Relation Age of Onset  . Stroke Neg Hx   . Cancer Neg Hx     Medications: Patient's Medications  New Prescriptions   No medications on file  Previous Medications   ALBUTEROL (PROAIR HFA) 108 (90 BASE) MCG/ACT INHALER    INHALE 1 PUFF IN MOUTH EVERY2- 4 HOURS AS NEEDED FOR WHEEZING   ALBUTEROL (PROVENTIL) (2.5 MG/3ML) 0.083% NEBULIZER SOLUTION    Take 3 mLs (2.5 mg total) by nebulization every 4 (four) hours as needed. Shortness of breath  ALPRAZOLAM (XANAX) 0.25 MG TABLET    Take 1 tablet (0.25 mg total) by mouth 2 (two) times daily as needed for anxiety.   AMBULATORY NON FORMULARY MEDICATION    Continuous O2 @@ 2LPM   AZITHROMYCIN (ZITHROMAX) 250 MG TABLET    Take 1 tablet (250 mg total) by mouth daily.   BUDESONIDE-FORMOTEROL (SYMBICORT) 160-4.5 MCG/ACT INHALER    Inhale 2 puffs into the lungs 2 (two) times daily.   CHOLECALCIFEROL (VITAMIN D) 1000 UNITS TABLET    Take 1,000 Units by mouth daily.   DULOXETINE (CYMBALTA) 30 MG CAPSULE    Take 30-60 mg by mouth 2 (two) times daily. Takes 30 mg in a.m. and 60 mg in p.m.   FENTANYL (DURAGESIC - DOSED  MCG/HR) 75 MCG/HR    Place 1 patch (75 mcg total) onto the skin every 3 (three) days.   GUAIFENESIN (MUCINEX) 600 MG 12 HR TABLET    Take 2 tablets (1,200 mg total) by mouth 2 (two) times daily.   IPRATROPIUM-ALBUTEROL (DUONEB) 0.5-2.5 (3) MG/3ML SOLN    Take 3 mLs by nebulization every 6 (six) hours.   LAMOTRIGINE (LAMICTAL) 100 MG TABLET    Take 100-200 mg by mouth 2 (two) times daily. 1 tablet in the morning and 2 tablets at night   METOCLOPRAMIDE (REGLAN) 10 MG TABLET    Take 1 tablet (10 mg total) by mouth at bedtime.   NUTRITIONAL SUPPLEMENTS (ENSURE HIGH PROTEIN) LIQD    Take 1 Can by mouth 3 (three) times daily.   OLANZAPINE (ZYPREXA) 5 MG TABLET    Take 5 mg by mouth at bedtime.   OMEPRAZOLE (PRILOSEC) 20 MG CAPSULE    Take 1 capsule (20 mg total) by mouth daily.   ONDANSETRON (ZOFRAN) 4 MG TABLET    Take 1 tablet (4 mg total) by mouth every 8 (eight) hours as needed for nausea.   OXYCODONE (ROXICODONE) 15 MG IMMEDIATE RELEASE TABLET    Take 1 tablet (15 mg total) by mouth every 6 (six) hours as needed for pain.   PREDNISONE (DELTASONE) 20 MG TABLET    Take 1 tablet (20 mg total) by mouth daily with breakfast.   TAPENTADOL HCL (NUCYNTA) 100 MG TABS    Take 100 mg by mouth 4 (four) times daily.   TIOTROPIUM (SPIRIVA) 18 MCG INHALATION CAPSULE    Place 1 capsule (18 mcg total) into inhaler and inhale daily.   ZOLPIDEM (AMBIEN) 5 MG TABLET    Take 1 tablet (5 mg total) by mouth at bedtime as needed for sleep.  Modified Medications   No medications on file  Discontinued Medications   No medications on file     Physical Exam: Filed Vitals:   03/18/13 1801  BP: 110/60  Pulse: 84  Temp: 97 F (36.1 C)  Resp: 18  SpO2: 96%   General- elderly female in no acute distress, cachectic Head- atraumatic, normocephalic Eyes- PERRLA, EOMI, no pallor, no icterus, no discharge Neck- no lymphadenopathy, no thyromegaly, no jugular vein distension, no carotid bruit Cardiovascular- normal s1,s2,  no murmurs/ rubs/ gallops Respiratory- bilateral no wheeze, no rhonchi, no crackles, poor air entry Abdomen- bowel sounds present, soft, non tender Musculoskeletal- able to move all 4 extremities, no spinal and paraspinal tenderness, steady gait, using a walker Neurological- no focal deficit, normal reflexes, normal muscle strength Psychiatry- alert and oriented to person, place and time, normal mood and affect   Labs reviewed: Basic Metabolic Panel:  Recent Labs  04/30/12 1431 11/30/12 1303  02/27/13 1725 03/02/13 0946  NA 137  --  129* 138  K 4.6  --  4.1 3.6  CL 95*  --  90* 94*  CO2 29  --  29 32  GLUCOSE 79  --  99 105*  BUN 10  --  7 8  CREATININE 0.53 0.65 0.33* 0.34*  CALCIUM 9.0  --  7.7* 8.3*   Liver Function Tests:  Recent Labs  03/02/13 0946  AST 27  ALT 33  ALKPHOS 106  BILITOT 0.1*  PROT 6.7  ALBUMIN 3.8    Recent Labs  11/30/12 1303  LIPASE 18.0  AMYLASE 103   No results found for this basename: AMMONIA,  in the last 8760 hours CBC:  Recent Labs  04/30/12 1431 02/27/13 1725 03/02/13 0946  WBC 7.3 6.9 11.6*  NEUTROABS 3.5  --   --   HGB 11.8* 10.9* 11.3*  HCT 34.6* 32.2* 33.9*  MCV 95.3 104.2* 104.6*  PLT 468* 330 341    Radiological Exams:  Dg Chest 2 View  03/02/2013   CLINICAL DATA:  Shortness of breath.  EXAM: CHEST  2 VIEW  COMPARISON:  Chest x-ray 02/27/2013.  FINDINGS: Marked hyperexpansion of the lungs with severe pruning of the pulmonary vasculature, most pronounced in the upper lungs, compatible with bullous emphysema. Diffuse peribronchial cuffing. No definite acute consolidative airspace disease. No pleural effusions. No definite pneumothorax. No evidence of pulmonary edema. Heart size is normal. Upper mediastinal contours are within normal limits.  IMPRESSION: 1. Chronic changes of advanced bullous emphysema redemonstrated, without definite radiographic evidence of acute cardiopulmonary disease.   Electronically Signed   By:  Vinnie Langton M.D.   On: 03/02/2013 10:38   Dg Chest Portable 1 View  02/27/2013   CLINICAL DATA:  Difficulty breathing  EXAM: PORTABLE CHEST - 1 VIEW COMPARISON:  Portable exam 2025 hr compared to 05/07/2012  FINDINGS: Normal heart size, mediastinal contours, and pulmonary vascularity.  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  Mild scattered interstitial prominence at the lower lungs, likely unchanged accounting for differences in technique.  No acute infiltrate, pleural effusion or pneumothorax.  No acute osseous findings.  IMPRESSION: COPD changes.  No acute abnormalities.   Electronically Signed   By: Lavonia Dana M.D.   On: 02/27/2013 18:57      Assessment/Plan  Dyspnea- in setting of chronic copd, recent exacerbation and deconditioning, improved breathing with oxygen and therapy sessions.   COPD- stable on continuous oxygen by nasal canula, continue her symbicort, spiriva, chronic prednisone and prn albuterol therapy. Completed her z pack. Monitor for early signs of copd exacerbation. Has been working with therapy team and has improved strength  GERD- continue prilosec 20 mg daily and reglan 10 mg daily  Insomnia- continue ambien 5 mg nightly as needed  Bipolar disorder- currently stable on xanax 0.25 mg twice daily as needed with her cymbalta 30 mg in the am and 60 mg in the pm. Will continue her lamictal 100 mg in the am and 200 mg in the pm and zyprexa 5 mg daily. Monitor clinically   Chronic low back pain- under control with her current pain med regimen. continue cymbalta 30 mg in the am and 60 mg in the pm, duragesic patch 75 mcg every 3 days, nucynta 100 mg four times daily and oxycodone 15 mg every 6 hours as needed  Family/ staff Communication: reviewed care plan with patient and nursing supervisor  Goals of care: to return  home   Labs/tests ordered: none

## 2013-03-19 ENCOUNTER — Encounter: Payer: Self-pay | Admitting: Adult Health

## 2013-03-19 ENCOUNTER — Non-Acute Institutional Stay (SKILLED_NURSING_FACILITY): Payer: Medicare Other | Admitting: Adult Health

## 2013-03-19 DIAGNOSIS — G8929 Other chronic pain: Secondary | ICD-10-CM

## 2013-03-19 DIAGNOSIS — F3160 Bipolar disorder, current episode mixed, unspecified: Secondary | ICD-10-CM

## 2013-03-19 DIAGNOSIS — J441 Chronic obstructive pulmonary disease with (acute) exacerbation: Secondary | ICD-10-CM

## 2013-03-19 DIAGNOSIS — E43 Unspecified severe protein-calorie malnutrition: Secondary | ICD-10-CM

## 2013-03-19 DIAGNOSIS — J9610 Chronic respiratory failure, unspecified whether with hypoxia or hypercapnia: Secondary | ICD-10-CM

## 2013-03-19 NOTE — Progress Notes (Signed)
Patient ID: [name], female   DOB: [birth year], 63 y.o.   MRN: [id number]     [facility]  Allergies  Allergen Reactions  . Banana Nausea And Vomiting  . Pollen Extract      Chief Complaint  Patient presents with  . Discharge Note    HPI:  She is being discharged to home; she will need home health for pt/ot/nursing. She will need tub bench to allow her to take showers independently. She will need prescriptions to be written. She had been hospitalized for her copd. She was admitted to this facility for short term rehab and is ready for discharge to home.   Past Medical History  Diagnosis Date  . Depression   . Suicidal ideation 2007     attempted overdose 2012  Dr. Tivis Ringer report  . Bipolar disorder   . Substance abuse      narcotics, alcohol, tobacco  . Chronic pain syndrome     follows at pain managment  . DEFICIENCY, VITAMIN D NOS 01/03/2007  . Chronic lower back pain   . Anxiety   . OSTEOPOROSIS 06/17/2009    DEXA 05/2009 : L femur -2.9; R femur -2.5. Alendronate on med list but not taking. Needs addressed ASAP as h/o fractures.    . Elevated liver function tests   . On home oxygen therapy     "2L; 24/7" (02/27/2013)  . Shortness of breath     "all the time" (02/27/2013)  . COPD (chronic obstructive pulmonary disease)     oxygen dependent  . Sciatic pain   . Arthritis     "legs, back; hips, primarily left hip" (02/27/2013)  . Anemia     Past Surgical History  Procedure Laterality Date  . Orif ankle fracture Left 11/16/2010  . Cesarean section  1974; 1979  . Tubal ligation  1979  . Augmentation mammaplasty  ~ 2007    VITAL SIGNS BP 128/78  Pulse 74  Ht 5' (1.524 m)  Wt 81 lb 12.8 oz (37.104 kg)  BMI 15.98 kg/m2   Patient's Medications  New Prescriptions   No medications on file  Previous Medications   ALBUTEROL (PROAIR HFA) 108 (90 BASE) MCG/ACT INHALER    INHALE 1 PUFF IN MOUTH EVERY2- 4 HOURS AS NEEDED FOR WHEEZING   ALBUTEROL  (PROVENTIL) (2.5 MG/3ML) 0.083% NEBULIZER SOLUTION    Take 3 mLs (2.5 mg total) by nebulization every 4 (four) hours as needed. Shortness of breath   ALPRAZOLAM (XANAX) 0.25 MG TABLET    Take 1 tablet (0.25 mg total) by mouth 2 (two) times daily as needed for anxiety.   AMBULATORY NON FORMULARY MEDICATION    Continuous O2 @@ 2LPM   AZITHROMYCIN (ZITHROMAX) 250 MG TABLET    Take 1 tablet (250 mg total) by mouth daily.   BUDESONIDE-FORMOTEROL (SYMBICORT) 160-4.5 MCG/ACT INHALER    Inhale 2 puffs into the lungs 2 (two) times daily.   CHOLECALCIFEROL (VITAMIN D) 1000 UNITS TABLET    Take 1,000 Units by mouth daily.   DULOXETINE (CYMBALTA) 30 MG CAPSULE    Take 30-60 mg by mouth 2 (two) times daily. Takes 30 mg in a.m. and 60 mg in p.m.   FENTANYL (DURAGESIC - DOSED MCG/HR) 75 MCG/HR    Place 1 patch (75 mcg total) onto the skin every 3 (three) days.   GUAIFENESIN (MUCINEX) 600 MG 12 HR TABLET    Take 2 tablets (1,200 mg total) by mouth 2 (two) times daily.   IPRATROPIUM-ALBUTEROL (  DUONEB) 0.5-2.5 (3) MG/3ML SOLN    Take 3 mLs by nebulization every 6 (six) hours.   LAMOTRIGINE (LAMICTAL) 100 MG TABLET    Take 100-200 mg by mouth 2 (two) times daily. 1 tablet in the morning and 2 tablets at night   METOCLOPRAMIDE (REGLAN) 10 MG TABLET    Take 1 tablet (10 mg total) by mouth at bedtime.   NUTRITIONAL SUPPLEMENTS (ENSURE HIGH PROTEIN) LIQD    Take 1 Can by mouth 3 (three) times daily.   OLANZAPINE (ZYPREXA) 5 MG TABLET    Take 5 mg by mouth at bedtime.   OMEPRAZOLE (PRILOSEC) 20 MG CAPSULE    Take 1 capsule (20 mg total) by mouth daily.   ONDANSETRON (ZOFRAN) 4 MG TABLET    Take 1 tablet (4 mg total) by mouth every 8 (eight) hours as needed for nausea.   OXYCODONE (ROXICODONE) 15 MG IMMEDIATE RELEASE TABLET    Take 1 tablet (15 mg total) by mouth every 6 (six) hours as needed for pain.   PREDNISONE (DELTASONE) 20 MG TABLET    Take 1 tablet (20 mg total) by mouth daily with breakfast.   TAPENTADOL HCL  (NUCYNTA) 100 MG TABS    Take 100 mg by mouth 4 (four) times daily.   TIOTROPIUM (SPIRIVA) 18 MCG INHALATION CAPSULE    Place 1 capsule (18 mcg total) into inhaler and inhale daily.   ZOLPIDEM (AMBIEN) 5 MG TABLET    Take 1 tablet (5 mg total) by mouth at bedtime as needed for sleep.  Modified Medications   No medications on file  Discontinued Medications   No medications on file    SIGNIFICANT DIAGNOSTIC EXAMS   02-27-13: chest x-ray: COPD changes. No acute abnormalities.   02-20-13: chest x-ray: 1. Chronic changes of advanced bullous emphysema redemonstrated, without definite radiographic evidence of acute cardiopulmonary disease.     LABS REVIEWED:   02-26-13: tsh 0.508; chol 184; ldl 76; trig 83 02-27-13: wbc 6.9; hgb 10.9; hct 32.2; mcv 104.2; plt 330; glucose 99; bun 7; creat 0.33; k+4.1; na++129 03-02-13: wbc 11.6; hgb 11.3; hct 33.9; mcv 104.6; plt 341; gucose 105; bun 8; creat 0.34; k+3.6; na++138; liver normal albumin 3.8     Review of Systems  Constitutional: negative   Respiratory: negative for shortness of breath. Negative for cough.        Is 02 dependent   Cardiovascular: Negative for chest pain, palpitations and leg swelling.  Gastrointestinal: Negative for heartburn and constipation.  Musculoskeletal: Positive for back pain. Negative for myalgias.       Has chronic back pain is managed   Skin: Negative.   Neurological: Negative for headaches.  Psychiatric/Behavioral: Negative for depression. The patient is not nervous The patient does not have insomnia.      Physical Exam  Constitutional: She is oriented to person, place, and time. No distress.  cathectic   Neck: Neck supple. No JVD present. No thyromegaly present.  Cardiovascular: Normal rate, regular rhythm and intact distal pulses.   Respiratory: Effort normal. No respiratory distress lung sounds clear.  GI: Soft. Bowel sounds are normal. She exhibits no distension. There is no tenderness.    Musculoskeletal: Normal range of motion. She exhibits no edema.  Neurological: She is alert and oriented to person, place, and time.  Skin: Skin is warm and dry. She is not diaphoretic.  Psychiatric: She has a normal mood and affect.    ASSESSMENT/ PLAN:  Will discharge to home with home health for  pt/ot/nursing. Will need shower bench. She will continue to need her home 02 and neb machine. Her prescriptions have been written.   Time spent with patient 40 minutes.

## 2013-03-21 DIAGNOSIS — J441 Chronic obstructive pulmonary disease with (acute) exacerbation: Secondary | ICD-10-CM

## 2013-03-21 DIAGNOSIS — E41 Nutritional marasmus: Secondary | ICD-10-CM

## 2013-03-21 DIAGNOSIS — F329 Major depressive disorder, single episode, unspecified: Secondary | ICD-10-CM

## 2013-03-21 DIAGNOSIS — F319 Bipolar disorder, unspecified: Secondary | ICD-10-CM

## 2013-03-21 DIAGNOSIS — M5430 Sciatica, unspecified side: Secondary | ICD-10-CM

## 2013-03-21 DIAGNOSIS — D539 Nutritional anemia, unspecified: Secondary | ICD-10-CM

## 2013-03-21 DIAGNOSIS — F3289 Other specified depressive episodes: Secondary | ICD-10-CM

## 2013-03-22 ENCOUNTER — Telehealth: Payer: Self-pay | Admitting: *Deleted

## 2013-03-22 NOTE — Telephone Encounter (Signed)
Call from pt - needs clarification on how to take Prednisone. States she d/c from the hospital last month and recently from [facility]. She has 3 bottles of Prednisone 10mg . Chart has 20mg  daily. Please clarify for pt.   Thanks

## 2013-03-25 ENCOUNTER — Telehealth: Payer: Self-pay | Admitting: *Deleted

## 2013-03-25 ENCOUNTER — Other Ambulatory Visit: Payer: Self-pay | Admitting: *Deleted

## 2013-03-25 DIAGNOSIS — R112 Nausea with vomiting, unspecified: Secondary | ICD-10-CM

## 2013-03-25 MED ORDER — ONDANSETRON HCL 4 MG PO TABS: 4.0000 mg | ORAL_TABLET | Freq: Three times a day (TID) | ORAL | Status: DC | PRN

## 2013-03-25 NOTE — Telephone Encounter (Signed)
Domitca with [facility] [phone] called to have a verbal order to continue home visit - a verbal order given to cont home visits. She wanted to inform clinic pt was still smoking - 2 cigs per day. Home was heavy of cig smoke. 2 cig butts were noted in ash tray. Hilda Blades Accalia Rigdon RN 03/25/13 10:40AM

## 2013-03-25 NOTE — Telephone Encounter (Signed)
I spoke with patient regarding her prednisone. It appears that patient was discharged from the hospital to a SNF with a plan to continue the prednisone 20mg  daily for 4 more days. Somehow patient was discharged from her SNF with multiple bottles of prednisone and was confused about how much to take. I do not see any documentation regarding a plan for chronic prednisone therapy while she was hospitalized. However, in the note from the nursing home there was mention of this. I do not believe patient needs chronic prednisone and her breathing has much improved, so I asked patient to stop taking the prednisone. She has an appointment in [facility] on 1/15 and she plans to follow up at that time.

## 2013-03-28 ENCOUNTER — Ambulatory Visit (INDEPENDENT_AMBULATORY_CARE_PROVIDER_SITE_OTHER): Payer: Medicaid Other | Admitting: Internal Medicine

## 2013-03-28 VITALS — BP 107/70 | HR 94 | Temp 98.9°F | Ht 61.0 in | Wt 83.8 lb

## 2013-03-28 DIAGNOSIS — Z09 Encounter for follow-up examination after completed treatment for conditions other than malignant neoplasm: Secondary | ICD-10-CM

## 2013-03-28 DIAGNOSIS — M545 Low back pain, unspecified: Secondary | ICD-10-CM

## 2013-03-28 DIAGNOSIS — J449 Chronic obstructive pulmonary disease, unspecified: Secondary | ICD-10-CM

## 2013-03-28 MED ORDER — OXYCODONE HCL 15 MG PO TABS: 15.0000 mg | ORAL_TABLET | Freq: Four times a day (QID) | ORAL | Status: DC

## 2013-03-28 NOTE — Patient Instructions (Signed)
It was nice to meet you today and so wonderful to see and hear you are doing better and HOME!!  Keep up the wonderful work with your exercise and drinking ensure.   Continue with the 3 L oxygen and nebulizers and we will help you follow up with your lung doctors.   The year can only get better!!

## 2013-03-28 NOTE — Assessment & Plan Note (Signed)
She has severe COPD Gold stage IV and has required increased oxygen dependence to 3 L from 2 L along with increased nebulizer use to 4 times a day instead of 3 times a day. The patient would like to be seen and evaluated by pulmonology again given the end-stage nature of her disease. Does not appear to be an acute exacerbation and seems to have reached a new baseline. -Referral to pulmonology -Home rehabilitation recommendation -cont 3L o2 and QID nebs -cont inhalers

## 2013-03-28 NOTE — Assessment & Plan Note (Signed)
Patient has a long-standing known history of chronic back pain likely secondary to DJD. She's been recently managed on no patches and OxyIR but states has been changed over to a medication at discharge from the nursing home but it is not providing results. Medication was not found when [facility] completed with patient during visit.  -d/c other medication -pt follows up in pain clinic -con fentanyl patch -oxyIR refill given

## 2013-03-28 NOTE — Progress Notes (Signed)
Subjective:    Patient ID: [name], female    DOB: [birth year], 63 y.o.   MRN: [id number]  HPI [name] is a 63 yo woman pmh as listed below presents for NH discharge and hospital discharge.   Pt was discharged from nursing home on 03/19/13 for some PT and has been doing excellent at home. She states that her nebulizer were increased to QID and her O2 requirement was changed to 3L and that has provided great relief to her symptoms. She completed the steroids and Zpack for copd exacerbation.   At home she has had a good appetite and gained weight she continues to participate in physical therapy with home health PT. She denied any significant source of breath and has not had to use her rescue inhaler at all since her discharge home. She only has a slight to minimal cough that is much improved and only a productive on occasions with gray sputum. She does not have significant shortness of breath from her baseline but still has some dyspnea on exertion that is relieved quickly with rest. She denied any fever/chills, nausea/vomiting/diarrhea.  The only pain she complains of is chronic back pain and she states that she had some recent pain medication change during her discharge and that is not working for her and she would like to return to her OxyIR and fentanyl patch as before.  Review of Systems  Constitutional: Negative for fever, chills, diaphoresis, activity change, fatigue and unexpected weight change.  HENT: Negative for congestion, sinus pressure and sore throat.   Respiratory: Positive for cough (intermittent not above baseline) and shortness of breath (not above baseline). Negative for chest tightness.   Cardiovascular: Negative for chest pain, palpitations and leg swelling.  Gastrointestinal: Negative for nausea, vomiting, abdominal pain, diarrhea and constipation.  Musculoskeletal: Positive for back pain (chronic ). Negative for myalgias.  Neurological: Negative for dizziness,  syncope, light-headedness and headaches.    Past Medical History  Diagnosis Date  . Depression   . Suicidal ideation 2007     attempted overdose 2012  Dr. Tivis Ringer report  . Bipolar disorder   . Substance abuse      narcotics, alcohol, tobacco  . Chronic pain syndrome     follows at pain managment  . DEFICIENCY, VITAMIN D NOS 01/03/2007  . Chronic lower back pain   . Anxiety   . OSTEOPOROSIS 06/17/2009    DEXA 05/2009 : L femur -2.9; R femur -2.5. Alendronate on med list but not taking. Needs addressed ASAP as h/o fractures.    . Elevated liver function tests   . On home oxygen therapy     "2L; 24/7" (02/27/2013)  . Shortness of breath     "all the time" (02/27/2013)  . COPD (chronic obstructive pulmonary disease)     oxygen dependent  . Sciatic pain   . Arthritis     "legs, back; hips, primarily left hip" (02/27/2013)  . Anemia    Social, surgical, family history reviewed with patient and updated in appropriate chart locations.      Objective:   Physical Exam Filed Vitals:   03/28/13 0906  BP: 107/70  Pulse: 94  Temp: 98.9 F (37.2 C)   General: sitting in chair, wearing O2, very thin HEENT: PERRL, EOMI, no scleral icterus Cardiac: RRR, no rubs, murmurs or gallops Pulm: clear to auscultation bilaterally, no crackles or wheezes or rhonchi, moving normal volumes of air Abd: soft, nontender, nondistended, BS present Ext: warm and well  perfused, no pedal edema Neuro: alert and oriented X3, cranial nerves II-XII grossly intact     Assessment & Plan:  Please see problem oriented charting  Pt discussed with Dr. Marinda Elk

## 2013-03-29 NOTE — Progress Notes (Signed)
Case discussed with Dr. Sadek soon after the resident saw the patient.  We reviewed the resident's history and exam and pertinent patient test results.  I agree with the assessment, diagnosis, and plan of care documented in the resident's note. 

## 2013-04-02 NOTE — Telephone Encounter (Signed)
Pt was seen by Dr Algis Liming 03/28/13 in clinic.

## 2013-04-17 ENCOUNTER — Encounter: Payer: Self-pay | Admitting: Internal Medicine

## 2013-04-17 ENCOUNTER — Ambulatory Visit (INDEPENDENT_AMBULATORY_CARE_PROVIDER_SITE_OTHER): Payer: PRIVATE HEALTH INSURANCE | Admitting: Internal Medicine

## 2013-04-17 VITALS — BP 107/62 | HR 80 | Temp 98.1°F | Ht 61.0 in | Wt 83.5 lb

## 2013-04-17 DIAGNOSIS — J309 Allergic rhinitis, unspecified: Secondary | ICD-10-CM

## 2013-04-17 DIAGNOSIS — R112 Nausea with vomiting, unspecified: Secondary | ICD-10-CM | POA: Insufficient documentation

## 2013-04-17 DIAGNOSIS — J302 Other seasonal allergic rhinitis: Secondary | ICD-10-CM

## 2013-04-17 DIAGNOSIS — J449 Chronic obstructive pulmonary disease, unspecified: Secondary | ICD-10-CM

## 2013-04-17 DIAGNOSIS — J4489 Other specified chronic obstructive pulmonary disease: Secondary | ICD-10-CM

## 2013-04-17 DIAGNOSIS — R110 Nausea: Secondary | ICD-10-CM

## 2013-04-17 DIAGNOSIS — M545 Low back pain, unspecified: Secondary | ICD-10-CM

## 2013-04-17 MED ORDER — BUDESONIDE-FORMOTEROL FUMARATE 160-4.5 MCG/ACT IN AERO: 2.0000 | INHALATION_SPRAY | Freq: Two times a day (BID) | RESPIRATORY_TRACT | Status: DC

## 2013-04-17 MED ORDER — OXYCODONE HCL 15 MG PO TABS: 15.0000 mg | ORAL_TABLET | Freq: Four times a day (QID) | ORAL | Status: DC

## 2013-04-17 MED ORDER — FLUTICASONE PROPIONATE 50 MCG/ACT NA SUSP: 1.0000 | Freq: Every day | NASAL | Status: DC

## 2013-04-17 NOTE — Assessment & Plan Note (Signed)
I refilled patient's Flonase today. She reports good control of her symptoms.

## 2013-04-17 NOTE — Progress Notes (Signed)
Patient ID: [name], female   DOB: [birth year], 63 y.o.   MRN: [id number] HPI The patient is a 63 y.o. female with a history of   ROS: General: no fevers, chills, changes in weight, changes in appetite Skin: no rash HEENT: no blurry vision, hearing changes, sore throat Pulm: no dyspnea, coughing, wheezing CV: no chest pain, palpitations, shortness of breath Abd: no abdominal pain, nausea/vomiting, diarrhea/constipation GU: no dysuria, hematuria, polyuria Ext: no arthralgias, myalgias Neuro: no weakness, numbness, or tingling  Filed Vitals:   04/17/13 0957  BP: 107/62  Pulse: 80  Temp: 98.1 F (36.7 C)    PEX General: alert, cooperative, and in no apparent distress HEENT: pupils equal round and reactive to light, vision grossly intact, oropharynx clear and non-erythematous  Neck: supple, no lymphadenopathy, JVD, or carotid bruits Lungs: clear to ascultation bilaterally, normal work of respiration, no wheezes, rales, ronchi Heart: regular rate and rhythm, no murmurs, gallops, or rubs Abdomen: soft, non-tender, non-distended, normal bowel sounds Extremities: 2+ DP/PT pulses bilaterally, no cyanosis, clubbing, or edema Neurologic: alert & oriented X3, cranial nerves II-XII intact, strength grossly intact, sensation intact to light touch  Current Outpatient Prescriptions on File Prior to Visit  Medication Sig Dispense Refill  . albuterol (PROAIR HFA) 108 (90 BASE) MCG/ACT inhaler INHALE 1 PUFF IN MOUTH EVERY2- 4 HOURS AS NEEDED FOR WHEEZING  18 g  1  . albuterol (PROVENTIL) (2.5 MG/3ML) 0.083% nebulizer solution Take 3 mLs (2.5 mg total) by nebulization every 4 (four) hours as needed. Shortness of breath  75 mL  12  . ALPRAZolam (XANAX) 0.25 MG tablet Take 1 tablet (0.25 mg total) by mouth 2 (two) times daily as needed for anxiety.  60 tablet  5  . AMBULATORY NON FORMULARY MEDICATION Continuous O2 @@ 2LPM      . budesonide-formoterol (SYMBICORT) 160-4.5 MCG/ACT inhaler Inhale 2  puffs into the lungs 2 (two) times daily.      . cholecalciferol (VITAMIN D) 1000 UNITS tablet Take 1,000 Units by mouth daily.      . DULoxetine (CYMBALTA) 30 MG capsule Take 30-60 mg by mouth 2 (two) times daily. Takes 30 mg in a.m. and 60 mg in p.m.      Marland Kitchen fentaNYL (DURAGESIC - DOSED MCG/HR) 75 MCG/HR Place 1 patch (75 mcg total) onto the skin every 3 (three) days.  5 patch  0  . guaiFENesin (MUCINEX) 600 MG 12 hr tablet Take 2 tablets (1,200 mg total) by mouth 2 (two) times daily.  120 tablet  11  . ipratropium-albuterol (DUONEB) 0.5-2.5 (3) MG/3ML SOLN Take 3 mLs by nebulization every 6 (six) hours.  360 mL  11  . lamoTRIgine (LAMICTAL) 100 MG tablet Take 100-200 mg by mouth 2 (two) times daily. 1 tablet in the morning and 2 tablets at night      . metoCLOPramide (REGLAN) 10 MG tablet Take 1 tablet (10 mg total) by mouth at bedtime.  30 tablet  6  . Nutritional Supplements (ENSURE HIGH PROTEIN) LIQD Take 1 Can by mouth 3 (three) times daily.  90 Can  2  . OLANZapine (ZYPREXA) 5 MG tablet Take 5 mg by mouth at bedtime.      Marland Kitchen omeprazole (PRILOSEC) 20 MG capsule Take 1 capsule (20 mg total) by mouth daily.  30 capsule  3  . ondansetron (ZOFRAN) 4 MG tablet Take 1 tablet (4 mg total) by mouth every 8 (eight) hours as needed for nausea.  20 tablet  0  .  oxyCODONE (ROXICODONE) 15 MG immediate release tablet Take 1 tablet (15 mg total) by mouth 4 (four) times daily.  120 tablet  0  . Tapentadol HCl (NUCYNTA) 100 MG TABS Take 100 mg by mouth 4 (four) times daily.      Marland Kitchen tiotropium (SPIRIVA) 18 MCG inhalation capsule Place 1 capsule (18 mcg total) into inhaler and inhale daily.  32 capsule  11  . zolpidem (AMBIEN) 5 MG tablet Take 1 tablet (5 mg total) by mouth at bedtime as needed for sleep.  30 tablet  0   No current facility-administered medications on file prior to visit.    Assessment/Plan

## 2013-04-17 NOTE — Assessment & Plan Note (Signed)
Patient with severe COPD Gold stage IV. Patient's symptoms are stable on oxygen 3 L per minute nasal cannula as well as duo nebs 4 times a day. Patient was referred to pulmonology at her last visit and will followup as scheduled with them.

## 2013-04-17 NOTE — Assessment & Plan Note (Addendum)
The patient has chronic back pain for which she was followed in pain clinic up until she was fired from this clinic last week. I sent out another referral for pain clinic. I discussed with patient that I am comfortable refilling her oxycodone today, however I do not plan to make this a habit. She will need to be followed by the pain clinic. Patient endorses taking an extra dose "here and there "of her oxycodone at home. I discussed with her that she should not be doing this and I will only prescribe her an early refill this month because she was not previously aware that she should not be doing this. I prescribed patient oxycodone IR 15 mg every 4 hours, #120. This should last her for 1 month while she gets established at another pain clinic.

## 2013-04-17 NOTE — Patient Instructions (Signed)
Thank you for your visit.  Today I refilled your oxycodone for your back pain. I also put a new referral for pain clinic. Please followup at the pain clinic as they will need to manage your chronic pain. Please only take the oxycodone every 4 hours, no more.  Please call your gastroenterologist, Dr. Sharlett Iles, for an appointment to followup with your nausea.  I refilled your Symbicort and Flonase today.  Please follow up with me in 3 months.

## 2013-04-17 NOTE — Assessment & Plan Note (Signed)
Patient with chronic nausea for the past 4-5 months. She's been seen by GI, Dr. Sharlett Iles, who noted that patient may need a gastric emptying study. I asked patient to call their office to schedule followup appointment with GI. She is amenable to this plan. We will continue patient Zofran and Reglan as previously prescribed. Patient's nausea may be secondary to her progressive COPD as noted in Dr. Buel Ream note in October 2014. I'm also concerned that patient's chronic opioid use may be contributing to her nausea. I discussed this with patient, however patient is not interested in discontinuing narcotics at this time given her chronic pain.

## 2013-04-17 NOTE — Progress Notes (Signed)
Patient ID: [name], female   DOB: February 18, 1951, 63 y.o.   MRN: [id number] HPI The patient is a 63 y.o. female with a history of COPD (GOLD IV on 3L O2 at home and duoneb QID), depression, chronic pain, bipolar disorder, chronic N/V (unknown etiology, followed by GI) who presents for routine clinic visit.  COPD, gold stage IV- Patient notes that her SOB is at baseline and that she has been doing well with regard to her breathing since her oxygen was increased to 3LPM continuously and her duoneb frequency was increased to QID from TID after her hospitalization during December 2014. She is able to walk daily without limiting shortness of breath as long as she is wearing her oxygen. She is using her rescue inhaler 1-2 times per day, which is much less than she had been using it prior to her hospitalization in the beginning of January 2015 (had been using it 4-5 times daily). Overall, she is satisfied with how her breathing is doing recently. Patient has a [place] aide who assists her at home.   Chronic nausea (last 5 months)- Patient has had intermittent nausea for the last 4-5 months. Patient notes that the nausea had improved for about 1 month a few weeks ago, but has been worsening over the last month. Patient reports that the nausea is worse with when she wakes up in the morning and tends to resolve in the afternoon. She has not been vomiting. No diarrhea or abd pain. She has dissolving zofran tabs that help to relieve her symptoms. She also has metoclopramide 10mg  that she takes qHS. Patient was evaluated by GI (Dr. Sharlett Iles) in 12/2012. Dr. Sharlett Iles noted that her N/V may be related to her severe and progressing COPD. He also mentioned that she may need a gastric emptying study if her symptoms do not improve. Patient is willing to call GI to schedule a follow up appointment with them. Of note, patient has h/o severe protein-calorie malnutrition and she drinks Ensure TID at home.  Chronic back pain-  Patient has h/o chronic back pain, which had been managed by a pain clinic. Unfortunately, patient's pain was not well controlled at her last visit at our clinic, so she was prescribed oxycodone at that time. Since this was a violation of her pain contract with her pain clinic, she was discharged from this pain clinic. She is requesting more oxycodone today. She is currently managed on a fentanyl patch and oxycodone.   ROS: General: no fevers, chills, changes in weight, changes in appetite Skin: no rash HEENT: no blurry vision, hearing changes, sore throat Pulm: see HPI CV: no chest pain, palpitations Abd: see HPI GU: no dysuria, hematuria, polyuria Back: see HPI Neuro: no weakness, numbness, or tingling  Filed Vitals:   04/17/13 0957  BP: 107/62  Pulse: 80  Temp: 98.1 F (36.7 C)  SpO2 95% on 3LPM O2 [place]  Physical Exam General: thin elderly woman, alert, cooperative, and in no apparent distress; on oxygen per [place] at 3LPM HEENT: pupils equal round and reactive to light, vision grossly intact, oropharynx clear and non-erythematous, MMM Neck: supple Lungs: clear to ascultation bilaterally, normal work of respiration, no wheezes, rales, ronchi Heart: regular rate and rhythm, no murmurs, gallops, or rubs Abdomen: soft, very thin, mild epigastric TTP, non-distended, normal bowel sounds Extremities: very thin extremities, though they are warm b/l without pedal edema Neurologic: alert & oriented X3, grossly intact, patient uses a walker to ambulate  Current Outpatient Prescriptions on File  Prior to Visit  Medication Sig Dispense Refill  . albuterol (PROAIR HFA) 108 (90 BASE) MCG/ACT inhaler INHALE 1 PUFF IN MOUTH EVERY2- 4 HOURS AS NEEDED FOR WHEEZING  18 g  1  . albuterol (PROVENTIL) (2.5 MG/3ML) 0.083% nebulizer solution Take 3 mLs (2.5 mg total) by nebulization every 4 (four) hours as needed. Shortness of breath  75 mL  12  . ALPRAZolam (XANAX) 0.25 MG tablet Take 1 tablet (0.25 mg  total) by mouth 2 (two) times daily as needed for anxiety.  60 tablet  5  . AMBULATORY NON FORMULARY MEDICATION Continuous O2 @@ 2LPM      . budesonide-formoterol (SYMBICORT) 160-4.5 MCG/ACT inhaler Inhale 2 puffs into the lungs 2 (two) times daily.      . cholecalciferol (VITAMIN D) 1000 UNITS tablet Take 1,000 Units by mouth daily.      . DULoxetine (CYMBALTA) 30 MG capsule Take 30-60 mg by mouth 2 (two) times daily. Takes 30 mg in a.m. and 60 mg in p.m.      Marland Kitchen fentaNYL (DURAGESIC - DOSED MCG/HR) 75 MCG/HR Place 1 patch (75 mcg total) onto the skin every 3 (three) days.  5 patch  0  . guaiFENesin (MUCINEX) 600 MG 12 hr tablet Take 2 tablets (1,200 mg total) by mouth 2 (two) times daily.  120 tablet  11  . ipratropium-albuterol (DUONEB) 0.5-2.5 (3) MG/3ML SOLN Take 3 mLs by nebulization every 6 (six) hours.  360 mL  11  . lamoTRIgine (LAMICTAL) 100 MG tablet Take 100-200 mg by mouth 2 (two) times daily. 1 tablet in the morning and 2 tablets at night      . metoCLOPramide (REGLAN) 10 MG tablet Take 1 tablet (10 mg total) by mouth at bedtime.  30 tablet  6  . Nutritional Supplements (ENSURE HIGH PROTEIN) LIQD Take 1 Can by mouth 3 (three) times daily.  90 Can  2  . OLANZapine (ZYPREXA) 5 MG tablet Take 5 mg by mouth at bedtime.      Marland Kitchen omeprazole (PRILOSEC) 20 MG capsule Take 1 capsule (20 mg total) by mouth daily.  30 capsule  3  . ondansetron (ZOFRAN) 4 MG tablet Take 1 tablet (4 mg total) by mouth every 8 (eight) hours as needed for nausea.  20 tablet  0  . oxyCODONE (ROXICODONE) 15 MG immediate release tablet Take 1 tablet (15 mg total) by mouth 4 (four) times daily.  120 tablet  0  . Tapentadol HCl (NUCYNTA) 100 MG TABS Take 100 mg by mouth 4 (four) times daily.      Marland Kitchen tiotropium (SPIRIVA) 18 MCG inhalation capsule Place 1 capsule (18 mcg total) into inhaler and inhale daily.  32 capsule  11  . zolpidem (AMBIEN) 5 MG tablet Take 1 tablet (5 mg total) by mouth at bedtime as needed for sleep.  30  tablet  0   No current facility-administered medications on file prior to visit.    Assessment/Plan

## 2013-04-18 NOTE — Progress Notes (Signed)
Case discussed with Dr. Mechele Claude at the time of the visit.  We reviewed the resident's history and exam and pertinent patient test results.  I agree with the assessment, diagnosis, and plan of care documented in the resident's note.

## 2013-04-25 ENCOUNTER — Ambulatory Visit (INDEPENDENT_AMBULATORY_CARE_PROVIDER_SITE_OTHER): Payer: Medicare Other | Admitting: Pulmonary Disease

## 2013-04-25 ENCOUNTER — Encounter (INDEPENDENT_AMBULATORY_CARE_PROVIDER_SITE_OTHER): Payer: Self-pay

## 2013-04-25 ENCOUNTER — Telehealth: Payer: Self-pay | Admitting: Pulmonary Disease

## 2013-04-25 ENCOUNTER — Encounter: Payer: Self-pay | Admitting: Pulmonary Disease

## 2013-04-25 VITALS — BP 150/72 | HR 80 | Temp 99.0°F | Ht 60.0 in | Wt 85.0 lb

## 2013-04-25 DIAGNOSIS — J439 Emphysema, unspecified: Secondary | ICD-10-CM

## 2013-04-25 DIAGNOSIS — J449 Chronic obstructive pulmonary disease, unspecified: Secondary | ICD-10-CM

## 2013-04-25 DIAGNOSIS — J438 Other emphysema: Secondary | ICD-10-CM

## 2013-04-25 MED ORDER — IPRATROPIUM-ALBUTEROL 0.5-2.5 (3) MG/3ML IN SOLN: 3.0000 mL | Freq: Four times a day (QID) | RESPIRATORY_TRACT | Status: DC

## 2013-04-25 MED ORDER — BUDESONIDE 0.5 MG/2ML IN SUSP: 0.5000 mg | Freq: Two times a day (BID) | RESPIRATORY_TRACT | Status: DC

## 2013-04-25 NOTE — Patient Instructions (Signed)
Stop albuterol in your nebulizer machine. Can use albuterol inhaler for rescue when away from home. Use duoneb in your nebulizer machine 4 times a day no matter what, and can take 2 additional treatments on bad days. Add budesonide 0.5mg  to your duoneb treatments twice a day only (usually am and afternoon) Stop symbicort and spiriva. Will refer to pulmonary rehab program at [facility] Stop smoking 100%.!  This is the most important part of your treatment.  Will schedule for breathing studies here in the next few weeks. Will see you back in 3 mos, but let me know if the medication changes are not going well.

## 2013-04-25 NOTE — Assessment & Plan Note (Signed)
The patient carries the diagnosis of severe COPD with chronic respiratory failure, but has not had PFTs for documentation. 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 We will need to schedule her for full PFTs. She has at least had an alpha-1 antitrypsin level that was normal in 2014. She currently is on a bronchodilator regimen with significant redundancy, and I have discussed with her the advantages and disadvantages of long-acting versus short-acting bronchodilators. She feels that she does well with nebulizer treatments at this point, and we will therefore discontinue her long-acting medications. We will ensure that she has a beta agonist, anti cholinergic, and an inhaled steroid in her nebulizer treatments.  I have also stressed to her the importance of aggressive pulmonary rehabilitation, and 100% total smoking cessation. Ultimately, she may be a potential candidate for lung transplantation if she continues to have poor functional status despite maximal treatment.

## 2013-04-25 NOTE — Progress Notes (Signed)
   Subjective:    Patient ID: [name], female    DOB: 05/21/1950, 63 y.o.   MRN: [id number]  HPI The patient is a 63 year old female who I've been asked to see for management of COPD. The patient has a long-standing history of tobacco abuse, but is currently down to 2 cigarettes a day. She notes breathing problems for greater than 2 years, but feels that her shortness of breath has been progressive in nature. She has been on oxygen for over a year. The patient states that she can walk 4 blocks around her apartment building with only intermittent dyspnea on exertion. She will get extremely winded walking up one flight of stairs. She has significant musculoskeletal issues and deconditioning, and would like to improve her exercise endurance. She has a mild chronic cough with minimal mucus at this time, but I suspect this is related to her ongoing smoking. She denies having any underlying cardiac disease or lower extremity edema. She has had an alpha-1 antitrypsin level checked which was normal. The patient had a recent acute exacerbation in December of last year, but feels that she has done well since then. A chest x-ray at that time showed bullous emphysema. She has never had pulmonary function studies, but has been scheduled for them on many occasions. Her current regimen his Symbicort and Spiriva, DuoNeb neb treatments 4 times a day, as well as albuterol by metered-dose inhaler or nebulizer for rescue. She is also on oxygen at 3 L 24 hours a day.   Review of Systems  Constitutional: Negative for fever and unexpected weight change.  HENT: Negative for congestion, dental problem, ear pain, nosebleeds, postnasal drip, rhinorrhea, sinus pressure, sneezing, sore throat and trouble swallowing.   Eyes: Negative for redness and itching.  Respiratory: Positive for cough and shortness of breath. Negative for chest tightness and wheezing.   Cardiovascular: Negative for palpitations and leg swelling.   Gastrointestinal: Negative for nausea and vomiting.  Genitourinary: Negative for dysuria.  Musculoskeletal: Negative for joint swelling.  Skin: Negative for rash.  Neurological: Negative for headaches.  Hematological: Does not bruise/bleed easily.  Psychiatric/Behavioral: Positive for dysphoric mood. The patient is not nervous/anxious.        Objective:   Physical Exam Constitutional:  Thin female, no acute distress  HENT:  Nares patent without discharge  Oropharynx without exudate, palate and uvula are normal  Eyes:  Perrla, eomi, no scleral icterus  Neck:  No JVD, no TMG  Cardiovascular:  Normal rate, regular rhythm, no rubs or gallops.  No murmurs        Intact distal pulses but decreased.  Pulmonary : diminished breath sounds, no stridor or respiratory distress   No rales, rhonchi, or wheezing  Abdominal:  Soft, nondistended, bowel sounds present.  No tenderness noted.   Musculoskeletal:  No lower extremity edema noted.  Lymph Nodes:  No cervical lymphadenopathy noted  Skin:  No cyanosis noted  Neurologic:  Alert, appropriate, moves all 4 extremities without obvious deficit.         Assessment & Plan:

## 2013-04-25 NOTE — Telephone Encounter (Signed)
Called spoke with amanda from [facility]. They are needing pt duoneb RX faxed to them. They did not receive this. They need this faxed to [phone]. I have done so. Nothing further needed

## 2013-04-26 ENCOUNTER — Telehealth: Payer: Self-pay | Admitting: Pulmonary Disease

## 2013-04-26 DIAGNOSIS — J449 Chronic obstructive pulmonary disease, unspecified: Secondary | ICD-10-CM

## 2013-04-26 MED ORDER — IPRATROPIUM-ALBUTEROL 0.5-2.5 (3) MG/3ML IN SOLN: 3.0000 mL | Freq: Four times a day (QID) | RESPIRATORY_TRACT | Status: DC

## 2013-04-26 NOTE — Telephone Encounter (Signed)
[name] called back and stated that they did not receive the rx for the duoneb.  She is requesting that this be faxed to them today.  Ok to stamp this rx for the pt.  This has been completed and nothing further is needed.

## 2013-04-26 NOTE — Telephone Encounter (Signed)
Spoke with [facility]. She is needing RX for duoneb. I advised her I faxed this over to them yesterday afternoon. She is going to check on this again and call us back.

## 2013-04-29 ENCOUNTER — Encounter: Payer: PRIVATE HEALTH INSURANCE | Admitting: Internal Medicine

## 2013-05-06 ENCOUNTER — Telehealth (HOSPITAL_COMMUNITY): Payer: Self-pay

## 2013-05-06 ENCOUNTER — Telehealth: Payer: Self-pay | Admitting: Pulmonary Disease

## 2013-05-06 NOTE — Telephone Encounter (Signed)
I called and spoke with pt. She reports the budesonide in her neb has improved her breathing. She is very thankful of [facility] putting her on the right track. FYI for [facility]

## 2013-05-06 NOTE — Telephone Encounter (Signed)
I spoke with patient regarding Pulmonary Rehab referral.  Patient is interested but is concerned about transportation because she does not drive. [name] is going to look into her options and call me back. I will follow up.

## 2013-05-08 ENCOUNTER — Ambulatory Visit: Payer: Medicare Other | Admitting: Internal Medicine

## 2013-05-09 ENCOUNTER — Inpatient Hospital Stay (HOSPITAL_COMMUNITY): Admission: RE | Admit: 2013-05-09 | Payer: Medicare Other | Source: Ambulatory Visit

## 2013-05-09 ENCOUNTER — Ambulatory Visit: Payer: Medicare Other | Admitting: Internal Medicine

## 2013-05-14 ENCOUNTER — Encounter: Payer: Self-pay | Admitting: Internal Medicine

## 2013-05-14 ENCOUNTER — Ambulatory Visit (INDEPENDENT_AMBULATORY_CARE_PROVIDER_SITE_OTHER): Payer: Medicare Other | Admitting: Internal Medicine

## 2013-05-14 VITALS — BP 130/72 | HR 108 | Temp 100.2°F | Ht 61.0 in | Wt 84.0 lb

## 2013-05-14 DIAGNOSIS — Z0000 Encounter for general adult medical examination without abnormal findings: Secondary | ICD-10-CM | POA: Insufficient documentation

## 2013-05-14 DIAGNOSIS — J069 Acute upper respiratory infection, unspecified: Secondary | ICD-10-CM

## 2013-05-14 DIAGNOSIS — M545 Low back pain, unspecified: Secondary | ICD-10-CM

## 2013-05-14 DIAGNOSIS — J441 Chronic obstructive pulmonary disease with (acute) exacerbation: Secondary | ICD-10-CM

## 2013-05-14 DIAGNOSIS — G8929 Other chronic pain: Secondary | ICD-10-CM

## 2013-05-14 DIAGNOSIS — J439 Emphysema, unspecified: Secondary | ICD-10-CM

## 2013-05-14 MED ORDER — ZOSTER VACCINE LIVE 19400 UNT/0.65ML ~~LOC~~ SOLR: 0.6500 mL | Freq: Once | SUBCUTANEOUS | Status: DC

## 2013-05-14 MED ORDER — OXYCODONE HCL 15 MG PO TABS: 15.0000 mg | ORAL_TABLET | Freq: Four times a day (QID) | ORAL | Status: DC

## 2013-05-14 NOTE — Progress Notes (Signed)
Case discussed with Dr. Schooler soon after the resident saw the patient.  We reviewed the resident's history and exam and pertinent patient test results.  I agree with the assessment, diagnosis, and plan of care documented in the resident's note. 

## 2013-05-14 NOTE — Assessment & Plan Note (Signed)
Likely viral, will manage conservatively without antibiotics.

## 2013-05-14 NOTE — Assessment & Plan Note (Signed)
Likely secondary to viral URI. No dyspnea or increased purulence but does have increased cough. Will manage conservatively with continued neb treatments of ipratropium-albuterol and budesonide. Pt with clear lungs without egophony. Instructed to contact clinic if symptoms worsen including increased sob, increased phlegm, chest pain, etc.

## 2013-05-14 NOTE — Progress Notes (Signed)
   Subjective:    Patient ID: [name], female    DOB: [birth year], 63 y.o.   MRN: [id number]  HPI  Pt presents for f/u of COPD exacerbation.  She reports improvement in shortness of breath since adding budesonide to her neb treatments but now has increased cough with cream colored sputum.  Had been referred to pulm rehab but states that she cant afford $40 co-pay.  Continues to smoke but has cut down to 2 cigarettes a day. Still with chronic nausea but has not followed up with GI.  Also missed her PFT appt. Pt has recently been dismissed from Pain Clinic for receiving pain meds from this clinic which is a contract violation.  She states that she needs to get a [place] identification in order to be accepted into the new clinic which she is "working on". Requesting refill of oxycodone IR 15 mg qid.    Review of Systems  Constitutional: Positive for fever. Negative for diaphoresis, appetite change and fatigue.       Chronic nausea  HENT: Negative for congestion, rhinorrhea and sinus pressure.   Eyes: Negative.   Respiratory: Positive for cough. Negative for shortness of breath.        Increased creamy phlegm  Cardiovascular: Negative for chest pain, palpitations and leg swelling.  Gastrointestinal: Positive for nausea. Negative for vomiting, constipation and blood in stool.  Genitourinary: Negative for dysuria.  Musculoskeletal: Negative.   Skin: Negative.   Neurological: Negative for headaches.  Psychiatric/Behavioral: Negative for agitation.       Objective:   Physical Exam  Constitutional: She is oriented to person, place, and time. She appears well-developed and well-nourished. No distress.  HENT:  Head: Normocephalic and atraumatic.  Wearing nasal canula on  95% 3L  Eyes: Conjunctivae and EOM are normal. Pupils are equal, round, and reactive to light.  Neck: Normal range of motion. Neck supple.  Cardiovascular: Normal rate, regular rhythm, normal heart sounds and intact distal  pulses.   Pulmonary/Chest: Effort normal and breath sounds normal. No respiratory distress. She has no wheezes. She has no rales.  Abdominal: Soft. Bowel sounds are normal.  Neurological: She is alert and oriented to person, place, and time.  Skin: Skin is warm and dry.  Psychiatric: She has a normal mood and affect.          Assessment & Plan:  See problem list charting:

## 2013-05-14 NOTE — Assessment & Plan Note (Signed)
Spoke with PCP.  Will refill for 3-4 week supply in anticipation that she will soon be accepted to another pain clinic.

## 2013-05-14 NOTE — Patient Instructions (Addendum)
You likely have a worsening of your COPD from a mild respiratory infection. No antibiotics are required at this time. Your lung exam is good today without wheezing or concern for pneumonia. We have provided a prescription for the shingles vaccine. Take this to your pharmacy. It is important to keep your appointments. Please call and re-schedule your breathing test (Pulmonary Function Tests). Contact Dr. Buel Ream office to discuss further evaluation of your nausea. He may want to order the gastric emptying study that was discussed. Dr. Mechele Claude will refill the oxycodone this month only but will not continue.  You will need to get the required identification to the pain clinic. Please stop smoking.  Even a few cigarettes a day will continue to cause problems for you with your COPD. If your cough worsening, please call the clinic so that we may further assist you.

## 2013-06-10 ENCOUNTER — Telehealth (HOSPITAL_COMMUNITY): Payer: Self-pay

## 2013-06-10 NOTE — Telephone Encounter (Signed)
Patient states that she can not afford Pulmonary Rehab.  I encouraged Elijah to call us in the future if her insurance situation changes.

## 2013-06-18 ENCOUNTER — Other Ambulatory Visit: Payer: Self-pay | Admitting: *Deleted

## 2013-06-18 NOTE — Telephone Encounter (Signed)
Pt also request Advair Diskus 250/50  Last filled 3/9 Not on med list.

## 2013-06-19 ENCOUNTER — Other Ambulatory Visit: Payer: Self-pay | Admitting: Internal Medicine

## 2013-06-19 MED ORDER — ALBUTEROL SULFATE HFA 108 (90 BASE) MCG/ACT IN AERS: INHALATION_SPRAY | RESPIRATORY_TRACT | Status: DC

## 2013-06-19 NOTE — Telephone Encounter (Signed)
Please read last note

## 2013-07-12 ENCOUNTER — Encounter: Payer: Self-pay | Admitting: Pulmonary Disease

## 2013-07-22 ENCOUNTER — Other Ambulatory Visit: Payer: Self-pay | Admitting: Internal Medicine

## 2013-07-23 ENCOUNTER — Ambulatory Visit: Payer: Medicare Other | Admitting: Pulmonary Disease

## 2013-09-02 ENCOUNTER — Encounter: Payer: Medicare Other | Admitting: Internal Medicine

## 2013-10-07 ENCOUNTER — Other Ambulatory Visit: Payer: Self-pay | Admitting: *Deleted

## 2013-10-07 MED ORDER — FLUTICASONE-SALMETEROL 250-50 MCG/DOSE IN AEPB: 1.0000 | INHALATION_SPRAY | Freq: Two times a day (BID) | RESPIRATORY_TRACT | Status: DC

## 2013-10-16 ENCOUNTER — Emergency Department (HOSPITAL_COMMUNITY): Payer: Medicare Other

## 2013-10-16 ENCOUNTER — Telehealth: Payer: Self-pay | Admitting: *Deleted

## 2013-10-16 ENCOUNTER — Emergency Department (HOSPITAL_COMMUNITY)
Admission: EM | Admit: 2013-10-16 | Discharge: 2013-10-16 | Disposition: A | Discharge status: Home / Self Care | Payer: Medicare Other | Attending: Emergency Medicine | Admitting: Emergency Medicine

## 2013-10-16 ENCOUNTER — Encounter (HOSPITAL_COMMUNITY): Payer: Self-pay | Admitting: Emergency Medicine

## 2013-10-16 DIAGNOSIS — R059 Cough, unspecified: Secondary | ICD-10-CM | POA: Insufficient documentation

## 2013-10-16 DIAGNOSIS — Z8739 Personal history of other diseases of the musculoskeletal system and connective tissue: Secondary | ICD-10-CM | POA: Diagnosis not present

## 2013-10-16 DIAGNOSIS — M810 Age-related osteoporosis without current pathological fracture: Secondary | ICD-10-CM | POA: Diagnosis not present

## 2013-10-16 DIAGNOSIS — R05 Cough: Secondary | ICD-10-CM | POA: Diagnosis not present

## 2013-10-16 DIAGNOSIS — F17200 Nicotine dependence, unspecified, uncomplicated: Secondary | ICD-10-CM | POA: Insufficient documentation

## 2013-10-16 DIAGNOSIS — IMO0002 Reserved for concepts with insufficient information to code with codable children: Secondary | ICD-10-CM | POA: Insufficient documentation

## 2013-10-16 DIAGNOSIS — Z862 Personal history of diseases of the blood and blood-forming organs and certain disorders involving the immune mechanism: Secondary | ICD-10-CM | POA: Diagnosis not present

## 2013-10-16 DIAGNOSIS — Z79899 Other long term (current) drug therapy: Secondary | ICD-10-CM | POA: Insufficient documentation

## 2013-10-16 DIAGNOSIS — F411 Generalized anxiety disorder: Secondary | ICD-10-CM | POA: Insufficient documentation

## 2013-10-16 DIAGNOSIS — G8929 Other chronic pain: Secondary | ICD-10-CM | POA: Insufficient documentation

## 2013-10-16 DIAGNOSIS — J439 Emphysema, unspecified: Secondary | ICD-10-CM

## 2013-10-16 DIAGNOSIS — Z8719 Personal history of other diseases of the digestive system: Secondary | ICD-10-CM | POA: Diagnosis not present

## 2013-10-16 DIAGNOSIS — J441 Chronic obstructive pulmonary disease with (acute) exacerbation: Secondary | ICD-10-CM | POA: Diagnosis present

## 2013-10-16 DIAGNOSIS — F319 Bipolar disorder, unspecified: Secondary | ICD-10-CM | POA: Diagnosis not present

## 2013-10-16 LAB — BASIC METABOLIC PANEL
Anion gap: 13 (ref 5–15)
BUN: 6 mg/dL (ref 6–23)
CALCIUM: 8 mg/dL — AB (ref 8.4–10.5)
CO2: 31 mEq/L (ref 19–32)
Chloride: 99 mEq/L (ref 96–112)
Creatinine, Ser: 0.34 mg/dL — ABNORMAL LOW (ref 0.50–1.10)
GFR calc Af Amer: >90 mL/min (ref >90)
GFR calc non Af Amer: >90 mL/min (ref >90)
Glucose, Bld: 94 mg/dL (ref 70–99)
POTASSIUM: 3.9 meq/L (ref 3.7–5.3)
Sodium: 143 mEq/L (ref 137–147)

## 2013-10-16 LAB — PRO B NATRIURETIC PEPTIDE: Pro B Natriuretic peptide (BNP): 480.2 pg/mL — ABNORMAL HIGH (ref 0–125)

## 2013-10-16 LAB — CBC
HCT: 28.6 % — ABNORMAL LOW (ref 36.0–46.0)
Hemoglobin: 9 g/dL — ABNORMAL LOW (ref 12.0–15.0)
MCH: 31.1 pg (ref 26.0–34.0)
MCHC: 31.5 g/dL (ref 30.0–36.0)
MCV: 99 fL (ref 78.0–100.0)
PLATELETS: 533 10*3/uL — AB (ref 150–400)
RBC: 2.89 MIL/uL — AB (ref 3.87–5.11)
RDW: 15.9 % — ABNORMAL HIGH (ref 11.5–15.5)
WBC: 6.8 10*3/uL (ref 4.0–10.5)

## 2013-10-16 LAB — I-STAT TROPONIN, ED: Troponin i, poc: 0.01 ng/mL (ref 0.00–0.08)

## 2013-10-16 MED ORDER — OXYCODONE HCL 5 MG PO TABS
15.0000 mg | ORAL_TABLET | Freq: Once | ORAL | Status: AC
  Administered 2013-10-16: 15 mg via ORAL
  Filled 2013-10-16: qty 3

## 2013-10-16 MED ORDER — PREDNISONE 20 MG PO TABS: 40.0000 mg | ORAL_TABLET | Freq: Every day | ORAL | Status: DC

## 2013-10-16 MED ORDER — ALBUTEROL SULFATE (2.5 MG/3ML) 0.083% IN NEBU
5.0000 mg | INHALATION_SOLUTION | Freq: Once | RESPIRATORY_TRACT | Status: AC
  Administered 2013-10-16: 5 mg via RESPIRATORY_TRACT
  Filled 2013-10-16: qty 6

## 2013-10-16 MED ORDER — IPRATROPIUM BROMIDE 0.02 % IN SOLN
0.5000 mg | Freq: Once | RESPIRATORY_TRACT | Status: AC
  Administered 2013-10-16: 0.5 mg via RESPIRATORY_TRACT
  Filled 2013-10-16: qty 2.5

## 2013-10-16 MED ORDER — METHYLPREDNISOLONE SODIUM SUCC 125 MG IJ SOLR
125.0000 mg | Freq: Once | INTRAMUSCULAR | Status: AC
  Administered 2013-10-16: 125 mg via INTRAVENOUS
  Filled 2013-10-16: qty 2

## 2013-10-16 NOTE — ED Notes (Addendum)
Pt ambulated well to the restroom and down the hall.  SpO2 started at 92%, decreased to 88% by return to room on her home O2 nasal cannula of 3L.

## 2013-10-16 NOTE — Telephone Encounter (Signed)
Pt called - past 2 days SOB. On 3L O2 via cannula. Due for nebulizer tx at 10AM. Pt states she is upset due to SOB. Pt states the last time she felt this way - they admitted pt to hospital. Suggest to pt to go to ER - no appt open till 10:45AM in [place] RN 10/16/13 9:45AM

## 2013-10-16 NOTE — Discharge Instructions (Signed)

## 2013-10-16 NOTE — ED Notes (Signed)
Patient transported to X-ray 

## 2013-10-16 NOTE — ED Notes (Signed)
Patient presents today with a chief complaint of increased shortness of breath since yesterday upon waking with productive cough x years . Patient is on home oxygen for COPD at 3 liters continuously, denies chest pain, nausea, vomiting.

## 2013-10-16 NOTE — ED Notes (Signed)
Patient transported to CT 

## 2013-10-16 NOTE — ED Provider Notes (Signed)
CSN: [id number]     Arrival date & time 10/16/13  1022 History   First MD Initiated Contact with Patient 10/16/13 1042     Chief Complaint  Patient presents with  . Shortness of Breath     (Consider location/radiation/quality/duration/timing/severity/associated sxs/prior Treatment) HPI Comments: Patient is a 63 year old female with history of bipolar disorder, depression, substance abuse, chronic pain, COPD who presents to the emergency department today with chief complaint of shortness of breath. She reports that she has been having gradually worsening shortness of breath since yesterday. She called her doctor today and her doctor suggested she come to the emergency department. She has had a change in her sputum over the past week. She has been admitted in the past for COPD exacerbation and states this feels similar to that time. She denies any fever, chills, nausea, vomiting, abdominal pain. She is on 3 L of oxygen at home. She wears oxygen all the time.  The history is provided by the patient. No language interpreter was used.    Past Medical History  Diagnosis Date  . Depression   . Suicidal ideation 2007     attempted overdose 2012  Dr. Tivis Ringer report  . Bipolar disorder   . Substance abuse      narcotics, alcohol, tobacco  . Chronic pain syndrome     follows at pain managment  . DEFICIENCY, VITAMIN D NOS 01/03/2007  . Chronic lower back pain   . Anxiety   . OSTEOPOROSIS 06/17/2009    DEXA 05/2009 : L femur -2.9; R femur -2.5. Alendronate on med list but not taking. Needs addressed ASAP as h/o fractures.    . Elevated liver function tests   . On home oxygen therapy     "2L; 24/7" (02/27/2013)  . Shortness of breath     "all the time" (02/27/2013)  . COPD (chronic obstructive pulmonary disease)     oxygen dependent  . Sciatic pain   . Arthritis     "legs, back; hips, primarily left hip" (02/27/2013)  . Anemia    Past Surgical History  Procedure Laterality Date  . Orif  ankle fracture Left 11/16/2010  . Cesarean section  1974; 1979  . Tubal ligation  1979  . Augmentation mammaplasty  ~ 2007   Family History  Problem Relation Age of Onset  . Stroke Neg Hx   . Cancer Neg Hx    History  Substance Use Topics  . Smoking status: Current Every Day Smoker -- 0.20 packs/day for 44 years    Types: Cigarettes  . Smokeless tobacco: Never Used     Comment: 02/27/2013 "smoking 2 cigarettes/day; that's down from 2 ppd"  . Alcohol Use: No     Comment: 02/27/2013 "nothing in 6 years; never had a problem w/it"   OB History   Grav Para Term Preterm Abortions TAB SAB Ect Mult Living                 Review of Systems  Constitutional: Negative for fever and chills.  Respiratory: Positive for cough and shortness of breath.   Cardiovascular: Negative for chest pain.  Gastrointestinal: Negative for nausea, vomiting and abdominal pain.  All other systems reviewed and are negative.     Allergies  Banana and Pollen extract  Home Medications   Prior to Admission medications   Medication Sig Start Date End Date Taking? Authorizing Provider  albuterol (PROVENTIL) (2.5 MG/3ML) 0.083% nebulizer solution Take 3 mLs (2.5 mg total) by nebulization every 4 (  four) hours as needed. Shortness of breath 02/28/13   Lesly Dukes, MD  alendronate (FOSAMAX) 35 MG tablet Take 35 mg by mouth every 7 (seven) days. Take with a full glass of water on an empty stomach.    Historical Provider, MD  ALPRAZolam Duanne Moron) 0.25 MG tablet Take 1 tablet (0.25 mg total) by mouth 2 (two) times daily as needed for anxiety. 03/06/13   Mahima Bubba Camp, MD  AMBULATORY NON FORMULARY MEDICATION Continuous O2 @@ 3LPM    Historical Provider, MD  budesonide (PULMICORT) 0.5 MG/2ML nebulizer solution Take 2 mLs (0.5 mg total) by nebulization 2 (two) times daily. Use with DuoNeb 04/25/13   Kathee Delton, MD  budesonide-formoterol [facility]) 160-4.5 MCG/ACT inhaler Inhale 2 puffs into the lungs 2 (two) times  daily. 04/17/13   Rebecca Eaton, MD  calcium carbonate (OS-CAL) 600 MG TABS tablet Take 600 mg by mouth 2 (two) times daily with a meal.    Historical Provider, MD  cetirizine (ZYRTEC) 10 MG tablet Take 10 mg by mouth daily.    Historical Provider, MD  chlorzoxazone (PARAFON) 500 MG tablet Take 500 mg by mouth 3 (three) times daily as needed for muscle spasms.    Historical Provider, MD  cholecalciferol (VITAMIN D) 1000 UNITS tablet Take 1,000 Units by mouth daily. 06/09/11   Ansel Bong, MD  DULoxetine (CYMBALTA) 30 MG capsule Take 30-60 mg by mouth 2 (two) times daily. Takes 30 mg in a.m. and 60 mg in p.m.    Historical Provider, MD  fentaNYL (DURAGESIC - DOSED MCG/HR) 75 MCG/HR Place 1 patch (75 mcg total) onto the skin every 3 (three) days. 03/04/13   Lesly Dukes, MD  fexofenadine-pseudoephedrine (ALLEGRA-D) 60-120 MG per tablet Take 1 tablet by mouth 2 (two) times daily.    Historical Provider, MD  fluticasone (FLONASE) 50 MCG/ACT nasal spray Place 1-2 sprays into both nostrils daily. 04/17/13 04/17/14  Rebecca Eaton, MD  Fluticasone-Salmeterol (ADVAIR DISKUS) 250-50 MCG/DOSE AEPB Inhale 1 puff into the lungs 2 (two) times daily. 10/07/13   Sid Falcon, MD  ipratropium-albuterol (DUONEB) 0.5-2.5 (3) MG/3ML SOLN Take 3 mLs by nebulization every 6 (six) hours. 04/26/13   Kathee Delton, MD  lamoTRIgine (LAMICTAL) 100 MG tablet Take 100-200 mg by mouth 2 (two) times daily. 1 tablet in the morning and 2 tablets at night    Historical Provider, MD  metoCLOPramide (REGLAN) 10 MG tablet Take 1 tablet (10 mg total) by mouth at bedtime. 01/01/13   Sable Feil, MD  Nutritional Supplements (ENSURE HIGH PROTEIN) LIQD Take 1 Can by mouth 3 (three) times daily. 08/08/12   Rosalia Hammers, MD  OLANZapine (ZYPREXA) 5 MG tablet Take 5 mg by mouth at bedtime. 01/03/11   Ansel Bong, MD  omeprazole (PRILOSEC) 20 MG capsule Take 1 capsule (20 mg total) by mouth daily. 11/07/12   Jones Bales, MD   ondansetron (ZOFRAN) 4 MG tablet Take 1 tablet (4 mg total) by mouth every 8 (eight) hours as needed for nausea. 03/25/13   Rebecca Eaton, MD  oxyCODONE (ROXICODONE) 15 MG immediate release tablet Take 1 tablet (15 mg total) by mouth 4 (four) times daily. 05/14/13   Valaria Good, MD  PROAIR HFA 108 (90 BASE) MCG/ACT inhaler INHALE 1 PUFF IN MOUTH EVERY 2-4 HOURS AS NEEDED FOR WHEEZING 07/22/13   Rebecca Eaton, MD  Tapentadol HCl (NUCYNTA) 100 MG TABS Take 100 mg by mouth 4 (four) times daily.    Historical Provider, MD  tiotropium (SPIRIVA) 18 MCG inhalation capsule Place 1 capsule (18 mcg total) into inhaler and inhale daily. 02/26/13 05/06/14  Charlann Lange, MD  zolpidem (AMBIEN) 5 MG tablet Take 1 tablet (5 mg total) by mouth at bedtime as needed for sleep. 04/30/12   Ansel Bong, MD  zoster vaccine live, PF, (ZOSTAVAX) 41962 UNT/0.65ML injection Inject 19,400 Units into the skin once. 05/14/13   Valaria Good, MD   BP 147/59  Pulse 76  Temp(Src) 98.8 F (37.1 C) (Oral)  Resp 20  SpO2 95% Physical Exam  Nursing note and vitals reviewed. Constitutional: She is oriented to person, place, and time. She appears well-developed and well-nourished. No distress.  HENT:  Head: Normocephalic and atraumatic.  Right Ear: External ear normal.  Left Ear: External ear normal.  Nose: Nose normal.  Mouth/Throat: Oropharynx is clear and moist.  Eyes: Conjunctivae are normal.  Neck: Normal range of motion.  Cardiovascular: Normal rate, regular rhythm and normal heart sounds.   Pulmonary/Chest: Effort normal. No stridor. No respiratory distress. She has decreased breath sounds. She has wheezes.  Speaking in full sentences  Abdominal: Soft. She exhibits no distension.  Musculoskeletal: Normal range of motion.  Neurological: She is alert and oriented to person, place, and time. She has normal strength.  Skin: Skin is warm and dry. She is not diaphoretic. No erythema.  Psychiatric: She has a normal mood  and affect. Her behavior is normal.    ED Course  Procedures (including critical care time) Labs Review Labs Reviewed  BASIC METABOLIC PANEL - Abnormal; Notable for the following:    Creatinine, Ser 0.34 (*)    Calcium 8.0 (*)    All other components within normal limits  CBC - Abnormal; Notable for the following:    RBC 2.89 (*)    Hemoglobin 9.0 (*)    HCT 28.6 (*)    RDW 15.9 (*)    Platelets 533 (*)    All other components within normal limits  PRO B NATRIURETIC PEPTIDE - Abnormal; Notable for the following:    Pro B Natriuretic peptide (BNP) 480.2 (*)    All other components within normal limits  I-STAT TROPOININ, ED    Imaging Review Dg Chest 2 View (if Patient Has Fever And/or Copd)  10/16/2013   CLINICAL DATA:  Shortness of breath and cough  EXAM: CHEST  2 VIEW  COMPARISON:  March 02, 2013  FINDINGS: There is underlying emphysematous change with extensive bullous disease in the upper lobes bilaterally. There is no edema or consolidation. Interstitial prominence in the lower lung zones is in large part is due to redistribution of blood flow to viable segments of lung. There is, however, a somewhat vague area of increased opacity in the right middle lobe seen on both frontal and lateral views. On the frontal view, this area measures approximately 3 x 2.5 cm. Elsewhere lungs are clear.  Heart size is within normal limits. Pulmonary vascularity reflects underlying emphysema. No adenopathy. No bone lesions.  IMPRESSION: Underlying emphysema. Vague opacity in the right middle lobe region which warrants correlation with noncontrast enhanced chest CT to further assess. Elsewhere lungs are grossly clear. Mild interstitial prominence the lower lobes in large part is felt to be due to redistribution of blood flow to viable segments of lung given the degree of bullous disease in the upper lobes diffusely.   Electronically Signed   By: Lowella Grip M.D.   On: 10/16/2013 11:32   Ct Chest  Wo Contrast  10/16/2013   CLINICAL DATA:  Increased shortness of breath. History of productive cough.  EXAM: CT CHEST WITHOUT CONTRAST  TECHNIQUE: Multidetector CT imaging of the chest was performed following the standard protocol without IV contrast.  COMPARISON:  Chest radiograph 10/16/2013.  Abdominal CT 09/04/2008  FINDINGS: There is a 1.6 cm low-density nodule in the right thyroid lobe. There was a nodule in this region on a cervical spine CT from 10/25/2010 and the nodule previously measured roughly 1.2 cm. Patient has bilateral breast implants. There does not appear to be significant mediastinal lymphadenopathy. No significant pericardial or pleural fluid. There is a punctate calcification in the left kidney mid pole but no evidence for hydronephrosis. No acute abnormalities in the upper abdomen.  Patient has severe bullous emphysema, particularly in the upper lobes. Trachea and mainstem bronchi are patent. No suspicious pulmonary nodules. No significant airspace disease or consolidation. There is a stable punctate pleural-based density in the left lower lobe superior segment on sequence 3, image 32. This is likely benign based on the stability. No acute bone abnormality.  IMPRESSION: Severe emphysematous disease without an acute chest abnormality.  Punctate left kidney stone or calcification.  1.6 cm right thyroid nodule. This nodule may have enlarged since 2012. This nodule could be further characterized with a thyroid ultrasound.   Electronically Signed   By: Markus Daft M.D.   On: 10/16/2013 15:07     EKG Interpretation None      MDM   Final diagnoses:  Pulmonary emphysema, unspecified emphysema type    Patient presents to ED with shortness of breath which has been gradually worsening over the past 2 days. Patient feels significantly improved and back to baseline after 2 breathing treatments. Patient able to ambulate in halls with oxygen saturation 88-91%. She reports feeling well with this  ambulation. Patient given solumedrol in ED. Patient feels comfortable with discharge. She will follow up with PCP. Discussed reasons to return to ED immediately. Vital signs stable for discharge. Discussed case with Dr. Rogene Houston who agrees with plan. Patient / Family / Caregiver informed of clinical course, understand medical decision-making process, and agree with plan.     Elwyn Lade, PA-C 10/17/13 1146

## 2013-10-18 NOTE — ED Provider Notes (Signed)
Medical screening examination/treatment/procedure(s) were performed by non-physician practitioner and as supervising physician I was immediately available for consultation/collaboration.   EKG Interpretation   Date/Time:  Wednesday October 16 2013 10:33:33 EDT Ventricular Rate:  78 PR Interval:  120 QRS Duration: 60 QT Interval:  334 QTC Calculation: 380 R Axis:   94 Text Interpretation:  Normal sinus rhythm Rightward axis Septal infarct ,  age undetermined Abnormal ECG ED PHYSICIAN INTERPRETATION AVAILABLE IN  [facility] Confirmed by TEST, Record ([id number]) on 10/18/2013 7:19:36 AM        Fredia Sorrow, MD 10/18/13 1335

## 2013-10-27 ENCOUNTER — Encounter: Payer: Self-pay | Admitting: Internal Medicine

## 2013-10-27 NOTE — Progress Notes (Unsigned)
Patient ID: [name], female   DOB: Aug 22, 1950, 63 y.o.   MRN: [id number]  I am completing paperwork today that requires my signature from [place].  10/24/13: authorization for albuterol nebulizer soln

## 2013-11-08 ENCOUNTER — Inpatient Hospital Stay (HOSPITAL_COMMUNITY)
Admission: EM | Admit: 2013-11-08 | Discharge: 2013-11-11 | DRG: 193 | Disposition: A | Discharge status: Home / Self Care | Payer: Medicare Other | Attending: Internal Medicine | Admitting: Internal Medicine

## 2013-11-08 ENCOUNTER — Emergency Department (HOSPITAL_COMMUNITY): Payer: Medicare Other

## 2013-11-08 ENCOUNTER — Encounter (HOSPITAL_COMMUNITY): Payer: Self-pay | Admitting: Emergency Medicine

## 2013-11-08 DIAGNOSIS — R32 Unspecified urinary incontinence: Secondary | ICD-10-CM | POA: Diagnosis present

## 2013-11-08 DIAGNOSIS — J439 Emphysema, unspecified: Secondary | ICD-10-CM | POA: Diagnosis present

## 2013-11-08 DIAGNOSIS — M129 Arthropathy, unspecified: Secondary | ICD-10-CM | POA: Diagnosis present

## 2013-11-08 DIAGNOSIS — Z9981 Dependence on supplemental oxygen: Secondary | ICD-10-CM | POA: Diagnosis not present

## 2013-11-08 DIAGNOSIS — E43 Unspecified severe protein-calorie malnutrition: Secondary | ICD-10-CM | POA: Diagnosis present

## 2013-11-08 DIAGNOSIS — Z66 Do not resuscitate: Secondary | ICD-10-CM | POA: Diagnosis present

## 2013-11-08 DIAGNOSIS — G8929 Other chronic pain: Secondary | ICD-10-CM | POA: Diagnosis present

## 2013-11-08 DIAGNOSIS — Z681 Body mass index (BMI) 19 or less, adult: Secondary | ICD-10-CM | POA: Diagnosis not present

## 2013-11-08 DIAGNOSIS — J9620 Acute and chronic respiratory failure, unspecified whether with hypoxia or hypercapnia: Secondary | ICD-10-CM | POA: Diagnosis present

## 2013-11-08 DIAGNOSIS — R64 Cachexia: Secondary | ICD-10-CM | POA: Diagnosis present

## 2013-11-08 DIAGNOSIS — J441 Chronic obstructive pulmonary disease with (acute) exacerbation: Secondary | ICD-10-CM | POA: Diagnosis present

## 2013-11-08 DIAGNOSIS — K219 Gastro-esophageal reflux disease without esophagitis: Secondary | ICD-10-CM | POA: Diagnosis present

## 2013-11-08 DIAGNOSIS — J189 Pneumonia, unspecified organism: Secondary | ICD-10-CM | POA: Diagnosis present

## 2013-11-08 DIAGNOSIS — J449 Chronic obstructive pulmonary disease, unspecified: Secondary | ICD-10-CM

## 2013-11-08 DIAGNOSIS — F3130 Bipolar disorder, current episode depressed, mild or moderate severity, unspecified: Secondary | ICD-10-CM

## 2013-11-08 DIAGNOSIS — F17200 Nicotine dependence, unspecified, uncomplicated: Secondary | ICD-10-CM | POA: Diagnosis present

## 2013-11-08 DIAGNOSIS — M810 Age-related osteoporosis without current pathological fracture: Secondary | ICD-10-CM | POA: Diagnosis present

## 2013-11-08 DIAGNOSIS — D509 Iron deficiency anemia, unspecified: Secondary | ICD-10-CM | POA: Diagnosis present

## 2013-11-08 LAB — I-STAT TROPONIN, ED: TROPONIN I, POC: 0.01 ng/mL (ref 0.00–0.08)

## 2013-11-08 LAB — BASIC METABOLIC PANEL
ANION GAP: 18 — AB (ref 5–15)
BUN: 9 mg/dL (ref 6–23)
CHLORIDE: 93 meq/L — AB (ref 96–112)
CO2: 27 meq/L (ref 19–32)
Calcium: 8 mg/dL — ABNORMAL LOW (ref 8.4–10.5)
Creatinine, Ser: 0.38 mg/dL — ABNORMAL LOW (ref 0.50–1.10)
GFR calc Af Amer: >90 mL/min (ref >90)
GFR calc non Af Amer: >90 mL/min (ref >90)
GLUCOSE: 97 mg/dL (ref 70–99)
POTASSIUM: 3.8 meq/L (ref 3.7–5.3)
SODIUM: 138 meq/L (ref 137–147)

## 2013-11-08 LAB — CBC
HCT: 29.2 % — ABNORMAL LOW (ref 36.0–46.0)
Hemoglobin: 9.1 g/dL — ABNORMAL LOW (ref 12.0–15.0)
MCH: 29.5 pg (ref 26.0–34.0)
MCHC: 31.2 g/dL (ref 30.0–36.0)
MCV: 94.8 fL (ref 78.0–100.0)
PLATELETS: 617 10*3/uL — AB (ref 150–400)
RBC: 3.08 MIL/uL — AB (ref 3.87–5.11)
RDW: 16.8 % — ABNORMAL HIGH (ref 11.5–15.5)
WBC: 9.8 10*3/uL (ref 4.0–10.5)

## 2013-11-08 MED ORDER — LEVOFLOXACIN IN D5W 500 MG/100ML IV SOLN
500.0000 mg | Freq: Once | INTRAVENOUS | Status: AC
  Administered 2013-11-08: 500 mg via INTRAVENOUS
  Filled 2013-11-08 (×2): qty 100

## 2013-11-08 MED ORDER — METHYLPREDNISOLONE SODIUM SUCC 125 MG IJ SOLR
125.0000 mg | INTRAMUSCULAR | Status: AC
  Administered 2013-11-08: 125 mg via INTRAVENOUS
  Filled 2013-11-08: qty 2

## 2013-11-08 MED ORDER — ALBUTEROL SULFATE (2.5 MG/3ML) 0.083% IN NEBU
2.5000 mg | INHALATION_SOLUTION | RESPIRATORY_TRACT | Status: DC
  Administered 2013-11-08: 2.5 mg via RESPIRATORY_TRACT
  Filled 2013-11-08: qty 3

## 2013-11-08 MED ORDER — ONDANSETRON HCL 4 MG/2ML IJ SOLN
4.0000 mg | Freq: Four times a day (QID) | INTRAMUSCULAR | Status: DC | PRN
Start: 2013-11-08 — End: 2013-11-11

## 2013-11-08 MED ORDER — METHYLPREDNISOLONE SODIUM SUCC 125 MG IJ SOLR
125.0000 mg | Freq: Every day | INTRAMUSCULAR | Status: DC
  Administered 2013-11-08: 125 mg via INTRAVENOUS
  Filled 2013-11-08: qty 2

## 2013-11-08 MED ORDER — DULOXETINE HCL 30 MG PO CPEP
30.0000 mg | ORAL_CAPSULE | Freq: Two times a day (BID) | ORAL | Status: DC
  Administered 2013-11-08 – 2013-11-11 (×6): 30 mg via ORAL
  Filled 2013-11-08 (×7): qty 1

## 2013-11-08 MED ORDER — ONDANSETRON HCL 4 MG PO TABS: 4.0000 mg | ORAL_TABLET | Freq: Four times a day (QID) | ORAL | Status: DC | PRN

## 2013-11-08 MED ORDER — METHYLPREDNISOLONE SODIUM SUCC 125 MG IJ SOLR
60.0000 mg | Freq: Two times a day (BID) | INTRAMUSCULAR | Status: DC
  Filled 2013-11-08: qty 0.96

## 2013-11-08 MED ORDER — OXYCODONE HCL 5 MG PO TABS
15.0000 mg | ORAL_TABLET | Freq: Four times a day (QID) | ORAL | Status: DC
  Administered 2013-11-08 – 2013-11-11 (×11): 15 mg via ORAL
  Filled 2013-11-08 (×11): qty 3

## 2013-11-08 MED ORDER — GUAIFENESIN-DM 100-10 MG/5ML PO SYRP
5.0000 mL | ORAL_SOLUTION | ORAL | Status: DC | PRN
  Administered 2013-11-09: 5 mL via ORAL
  Filled 2013-11-08: qty 5

## 2013-11-08 MED ORDER — ALBUTEROL SULFATE (2.5 MG/3ML) 0.083% IN NEBU: 2.5000 mg | INHALATION_SOLUTION | RESPIRATORY_TRACT | Status: DC | PRN

## 2013-11-08 MED ORDER — LEVOFLOXACIN IN D5W 750 MG/150ML IV SOLN
750.0000 mg | INTRAVENOUS | Status: DC
  Administered 2013-11-09: 750 mg via INTRAVENOUS
  Filled 2013-11-08: qty 150

## 2013-11-08 MED ORDER — IPRATROPIUM BROMIDE 0.02 % IN SOLN
0.5000 mg | RESPIRATORY_TRACT | Status: DC
  Administered 2013-11-08 (×2): 0.5 mg via RESPIRATORY_TRACT
  Filled 2013-11-08 (×2): qty 2.5

## 2013-11-08 MED ORDER — ALBUTEROL SULFATE (2.5 MG/3ML) 0.083% IN NEBU
2.5000 mg | INHALATION_SOLUTION | Freq: Four times a day (QID) | RESPIRATORY_TRACT | Status: DC
  Administered 2013-11-09 – 2013-11-11 (×10): 2.5 mg via RESPIRATORY_TRACT
  Filled 2013-11-08 (×12): qty 3

## 2013-11-08 MED ORDER — IPRATROPIUM BROMIDE 0.02 % IN SOLN: 0.5000 mg | RESPIRATORY_TRACT | Status: DC

## 2013-11-08 MED ORDER — SODIUM CHLORIDE 0.9 % IV SOLN: INTRAVENOUS | Status: DC

## 2013-11-08 MED ORDER — IPRATROPIUM BROMIDE 0.02 % IN SOLN
0.5000 mg | Freq: Four times a day (QID) | RESPIRATORY_TRACT | Status: DC
  Administered 2013-11-09 – 2013-11-11 (×10): 0.5 mg via RESPIRATORY_TRACT
  Filled 2013-11-08 (×12): qty 2.5

## 2013-11-08 MED ORDER — LEVOFLOXACIN IN D5W 250 MG/50ML IV SOLN
250.0000 mg | Freq: Once | INTRAVENOUS | Status: AC
  Administered 2013-11-08: 250 mg via INTRAVENOUS
  Filled 2013-11-08: qty 50

## 2013-11-08 MED ORDER — METOCLOPRAMIDE HCL 10 MG PO TABS
10.0000 mg | ORAL_TABLET | Freq: Two times a day (BID) | ORAL | Status: DC
  Administered 2013-11-08 – 2013-11-11 (×6): 10 mg via ORAL
  Filled 2013-11-08 (×7): qty 1

## 2013-11-08 MED ORDER — MOMETASONE FURO-FORMOTEROL FUM 100-5 MCG/ACT IN AERO
2.0000 | INHALATION_SPRAY | Freq: Two times a day (BID) | RESPIRATORY_TRACT | Status: DC
  Administered 2013-11-08 – 2013-11-11 (×5): 2 via RESPIRATORY_TRACT
  Filled 2013-11-08: qty 8.8

## 2013-11-08 MED ORDER — HEPARIN SODIUM (PORCINE) 5000 UNIT/ML IJ SOLN
5000.0000 [IU] | Freq: Three times a day (TID) | INTRAMUSCULAR | Status: DC
  Administered 2013-11-08 – 2013-11-11 (×8): 5000 [IU] via SUBCUTANEOUS
  Filled 2013-11-08 (×11): qty 1

## 2013-11-08 MED ORDER — ZOLPIDEM TARTRATE 5 MG PO TABS
5.0000 mg | ORAL_TABLET | Freq: Every day | ORAL | Status: DC
  Administered 2013-11-08 – 2013-11-10 (×3): 5 mg via ORAL
  Filled 2013-11-08 (×3): qty 1

## 2013-11-08 MED ORDER — ONDANSETRON HCL 4 MG/2ML IJ SOLN: 4.0000 mg | Freq: Three times a day (TID) | INTRAMUSCULAR | Status: AC | PRN

## 2013-11-08 MED ORDER — METHYLPREDNISOLONE SODIUM SUCC 125 MG IJ SOLR
60.0000 mg | Freq: Two times a day (BID) | INTRAMUSCULAR | Status: DC
  Administered 2013-11-09 – 2013-11-10 (×4): 60 mg via INTRAVENOUS
  Filled 2013-11-08 (×7): qty 0.96

## 2013-11-08 MED ORDER — ONDANSETRON HCL 4 MG PO TABS: 4.0000 mg | ORAL_TABLET | Freq: Three times a day (TID) | ORAL | Status: DC | PRN

## 2013-11-08 MED ORDER — LORATADINE 10 MG PO TABS
10.0000 mg | ORAL_TABLET | Freq: Every day | ORAL | Status: DC
Start: 2013-11-08 — End: 2013-11-11
  Administered 2013-11-08 – 2013-11-11 (×4): 10 mg via ORAL
  Filled 2013-11-08 (×4): qty 1

## 2013-11-08 NOTE — H&P (Signed)
Date: 11/08/2013               Patient Name:  [name] MRN: [id number]  DOB: November 10, 1950 Age / Sex: 63 y.o., female   PCP: Jordan Rakes, MD         Medical Service: Internal Medicine Teaching Service         Attending Physician: Dr. Bartholomew Crews, MD    First Contact: Dr. Charlott Mcclure Pager: [phone]  Second Contact: Dr. Adele Mcclure Pager: [phone]       After Hours (After 5p/  First Contact Pager: [phone]  weekends / holidays): Second Contact Pager: [phone]   Chief Complaint: "not feeling well" x 2-3 days  History of Present Illness: [name] is a 63 year old female with COPD (GOLD Stage IV on 3L O2), tobacco abuse, osteoporosis, GERD, chronic pain, depression, bipolar disorder who presents to the ED today for "not feeling well" over the last 2-3 days.  Over the last several days, she reports decreased appetite, nausea, subjective fevers, vomiting, and worsening productive cough but denies any sick contacts. At home, she takes Duoneb nebulizer treatments QID, albuterol prn for wheezing, Advair 1 puff BID. She was referred to pulmonary rehab by Dr. Gwenette Mcclure (Pulmonology) per his note but did not go due to cost. She has smoked since age 25 but has decreased her intake from 2 ppd to 2 cigarettes/day. She lives at [facility] in [place] presumably by herself.  In the ED, CXR showed LLL infiltrate concerning for pneumonia and was started on Levaquin 500mg  IV. She also received Solumedrol 125mg  IM x 1.    Meds: No current facility-administered medications for this encounter.   Current Outpatient Prescriptions  Medication Sig Dispense Refill  . DULoxetine (CYMBALTA) 30 MG capsule Take 30 mg by mouth 2 (two) times daily.       . Fluticasone-Salmeterol (ADVAIR DISKUS) 250-50 MCG/DOSE AEPB Inhale 1 puff into the lungs 2 (two) times daily.  60 each  2  . lamoTRIgine (LAMICTAL) 100 MG tablet Take 100-200 mg by mouth 2 (two) times daily. 1 tablet in the morning and  2 tablets at night      . albuterol (PROAIR HFA) 108 (90 BASE) MCG/ACT inhaler Inhale 1 puff into the lungs every 2 (two) hours as needed for wheezing or shortness of breath.      Marland Kitchen albuterol (PROVENTIL) (2.5 MG/3ML) 0.083% nebulizer solution Take 3 mLs (2.5 mg total) by nebulization every 4 (four) hours as needed. Shortness of breath  75 mL  12  . alendronate (FOSAMAX) 35 MG tablet Take 35 mg by mouth every 7 (seven) days. Take with a full glass of water on an empty stomach.      . ALPRAZolam (XANAX) 0.25 MG tablet Take 1 tablet (0.25 mg total) by mouth 2 (two) times daily as needed for anxiety.  60 tablet  5  . AMBULATORY NON FORMULARY MEDICATION Continuous O2 @@ 3LPM      . budesonide (PULMICORT) 0.5 MG/2ML nebulizer solution Take 0.5 mg by nebulization 4 (four) times daily.      . calcium carbonate (OS-CAL) 600 MG TABS tablet Take 600 mg by mouth 2 (two) times daily with a meal.      . cholecalciferol (VITAMIN D) 1000 UNITS tablet Take 1,000 Units by mouth daily.      . fexofenadine (ALLEGRA) 180 MG tablet Take 180 mg by mouth daily.      Marland Kitchen ipratropium-albuterol (DUONEB) 0.5-2.5 (3) MG/3ML SOLN Take 3  mLs by nebulization every 6 (six) hours.  360 mL  11  . metoCLOPramide (REGLAN) 10 MG tablet Take 1 tablet (10 mg total) by mouth at bedtime.  30 tablet  6  . OLANZapine (ZYPREXA) 5 MG tablet Take 5 mg by mouth at bedtime.      Marland Kitchen omeprazole (PRILOSEC) 20 MG capsule Take 1 capsule (20 mg total) by mouth daily.  30 capsule  3  . ondansetron (ZOFRAN) 4 MG tablet Take 1 tablet (4 mg total) by mouth every 8 (eight) hours as needed for nausea.  20 tablet  0  . OVER THE COUNTER MEDICATION Take 1 tablet by mouth daily. Bone health vitamin      . oxyCODONE (ROXICODONE) 15 MG immediate release tablet Take 1 tablet (15 mg total) by mouth 4 (four) times daily.  120 tablet  0  . predniSONE (DELTASONE) 20 MG tablet Take 2 tablets (40 mg total) by mouth daily.  10 tablet  0  . Tapentadol HCl (NUCYNTA) 100 MG  TABS Take 100 mg by mouth 4 (four) times daily.      Marland Kitchen zolpidem (AMBIEN) 5 MG tablet Take 1 tablet (5 mg total) by mouth at bedtime as needed for sleep.  30 tablet  0    Allergies: Allergies as of 11/08/2013 - Review Complete 11/08/2013  Allergen Reaction Noted  . Banana Nausea And Vomiting 01/10/2012  . Pollen extract Other (See Comments) 11/20/2012   Past Medical History  Diagnosis Date  . Depression   . Suicidal ideation 2007     attempted overdose 2012  Dr. Tivis Mcclure report  . Bipolar disorder   . Substance abuse      narcotics, alcohol, tobacco  . Chronic pain syndrome     follows at pain managment  . DEFICIENCY, VITAMIN D NOS 01/03/2007  . Chronic lower back pain   . Anxiety   . OSTEOPOROSIS 06/17/2009    DEXA 05/2009 : L femur -2.9; R femur -2.5. Alendronate on med list but not taking. Needs addressed ASAP as h/o fractures.    . Elevated liver function tests   . On home oxygen therapy     "2L; 24/7" (02/27/2013)  . Shortness of breath     "all the time" (02/27/2013)  . COPD (chronic obstructive pulmonary disease)     oxygen dependent  . Sciatic pain   . Arthritis     "legs, back; hips, primarily left hip" (02/27/2013)  . Anemia    Past Surgical History  Procedure Laterality Date  . Orif ankle fracture Left 11/16/2010  . Cesarean section  1974; 1979  . Tubal ligation  1979  . Augmentation mammaplasty  ~ 2007   Family History  Problem Relation Age of Onset  . Stroke Neg Hx   . Cancer Neg Hx    History   Social History  . Marital Status: Single    Spouse Name: N/A    Number of Children: 2  . Years of Education: N/A   Occupational History  . retired    Social History Main Topics  . Smoking status: Current Every Day Smoker -- 0.20 packs/day for 44 years    Types: Cigarettes  . Smokeless tobacco: Never Used     Comment: 02/27/2013 "smoking 2 cigarettes/day; that's down from 2 ppd"  . Alcohol Use: No     Comment: 02/27/2013 "nothing in 6 years; never had  a problem w/it"  . Drug Use: No  . Sexual Activity: No   Other Topics Concern  .  Not on file   Social History Narrative  . No narrative on file    Review of Systems: Review of Systems  Constitutional: Positive for fever and malaise/fatigue.  Respiratory: Positive for cough, sputum production, shortness of breath and wheezing.   Gastrointestinal: Positive for vomiting. Negative for diarrhea and constipation.  Genitourinary:       No bowel/bladder dysfxn.  Neurological: Negative for dizziness.     Physical Exam: Blood pressure 137/56, pulse 82, temperature 98.9 F (37.2 C), temperature source Oral, resp. rate 11, height 5\' 1"  (1.549 m), weight 9 lb (4.082 kg), SpO2 96.00%.  General: resting in bed, sleepy-appearing, 4L O2 HEENT: PERRL, EOMI, no scleral icterus Cardiac: RRR, no rubs, murmurs or gallops Pulm: generalized wheezing with some rhonchi Abd: soft, nontender, nondistended, BS present Ext: warm and well perfused, no pedal edema Neuro: responded appropriately to our questions and name; able to move all 4 extremities freely   Lab results: Basic Metabolic Panel:  Recent Labs  11/08/13 1227  NA 138  K 3.8  CL 93*  CO2 27  GLUCOSE 97  BUN 9  CREATININE 0.38*  CALCIUM 8.0*   CBC:  Recent Labs  11/08/13 1227  WBC 9.8  HGB 9.1*  HCT 29.2*  MCV 94.8  PLT 617*    Imaging results:  Dg Chest 2 View (if Patient Has Fever And/or Copd)  11/08/2013   CLINICAL DATA:  Smoker shortness of breath  EXAM: CHEST  2 VIEW  COMPARISON:  10/16/2013  FINDINGS: Stable scoliosis. Heart size upper normal to mildly enlarged but stable. Hyperinflation indicate COPD. Nipple shadow right lung base noted. The right lung appears otherwise clear. On the left, there is lower lobe infiltrate.  IMPRESSION: Left lower lobe infiltrate concerning for superimposed on COPD.   Electronically Signed   By: Skipper Cliche M.D.   On: 11/08/2013 13:29    Other results: EKG: Reviewed and  compared with 10/16/13 Normal sinus rhythm with occasional PVCs  Assessment & Plan by Problem:  Ms. Schum is a 63 year old female with COPD (GOLD Stage IV on 3L O2), tobacco abuse, osteoporosis, GERD, chronic pain, depression, bipolar disorder who presents to the ED today for suspected CAP.  #CAP: CXR findings coupled with symptoms make this diagnosis likely. Aspiration less likely as she does not show signs of swallowing dysfunction. HCAP unlikely given that she has not been hospitalized over the last 90 days.  -Abx 1: continue Levaquin 500mg  IV -Check sputum & blood cultures -Continue O2 & pulse oximetry -Monitor on telemetry  #COPD: Continue her home medication & O2.  #Tobacco abuse: Her decreased use is certainly encouraging though stable from her visit with Dr. Gwenette Mcclure (Pulmonology) on 04/25/13.  -Continue advising against her tobacco use.  #Osteoporosis: Continue Vitamin D & Ca supplementation.   #Bipolar Disorder: Continue Cymbalta 30mg . .   #GERD: Continue Protonix 40mg  & Reglan 10mg  BID.  #Chronic pain: Holding Nucynta 100mg  & oxycodone 15mg  QID.  Dispo: Disposition is deferred at this time, awaiting improvement of current medical problems. Anticipated discharge in approximately 1-2 day(s).   The patient does have a current PCP (Jordan Rakes, MD) and does need an [facility] hospital follow-up appointment after discharge.  The patient does not know have transportation limitations that hinder transportation to clinic appointments.  Signed: Charlott Rakes, MD 11/08/2013, 4:33 PM

## 2013-11-08 NOTE — ED Notes (Signed)
pts sister called and gave phone number if need to call her ---[name] [phone]

## 2013-11-08 NOTE — ED Notes (Signed)
Per EMS pt from home c/o for ShOB and weakness. Upon EMS arrival patient pt was conscious & oriented x4 but lethargic. Pt left lungs- rhonchi and right lungs- expiratory wheezing. Patient sPo2 72/4L pt. 10mg  of albuterol and 1mg  of atrovent neb tx with sats 99%/on treatment. Patient has had 125mg  IV solumedrol. Patient has hx COPD, and current smoker wears 4L of O2 at baseline.

## 2013-11-08 NOTE — ED Notes (Signed)
Pt slow to respond has difficulty staying awake while talking sats 95% on 4 liters oxygen hob up 90 degrees

## 2013-11-08 NOTE — ED Provider Notes (Signed)
CSN: [id number]     Arrival date & time 11/08/13  1206 History   First MD Initiated Contact with Patient 11/08/13 1209     Chief Complaint  Patient presents with  . Shortness of Breath      HPI  Patient presents with concerns of dyspnea, weakness. Patient is a current smoker, has home oxygen 24/7. Patient is unclear of the time of onset of this illness. Since onset symptoms been progressive. Patient denies chest pain, syncope, does describe profound weakness. No nausea, vomiting, other focal complaints.   Patient was counseled on the need to stop smoking.   Past Medical History  Diagnosis Date  . Depression   . Suicidal ideation 2007     attempted overdose 2012  Dr. Tivis Ringer report  . Bipolar disorder   . Substance abuse      narcotics, alcohol, tobacco  . Chronic pain syndrome     follows at pain managment  . DEFICIENCY, VITAMIN D NOS 01/03/2007  . Chronic lower back pain   . Anxiety   . OSTEOPOROSIS 06/17/2009    DEXA 05/2009 : L femur -2.9; R femur -2.5. Alendronate on med list but not taking. Needs addressed ASAP as h/o fractures.    . Elevated liver function tests   . On home oxygen therapy     "2L; 24/7" (02/27/2013)  . Shortness of breath     "all the time" (02/27/2013)  . COPD (chronic obstructive pulmonary disease)     oxygen dependent  . Sciatic pain   . Arthritis     "legs, back; hips, primarily left hip" (02/27/2013)  . Anemia    Past Surgical History  Procedure Laterality Date  . Orif ankle fracture Left 11/16/2010  . Cesarean section  1974; 1979  . Tubal ligation  1979  . Augmentation mammaplasty  ~ 2007   Family History  Problem Relation Age of Onset  . Stroke Neg Hx   . Cancer Neg Hx    History  Substance Use Topics  . Smoking status: Current Every Day Smoker -- 0.20 packs/day for 44 years    Types: Cigarettes  . Smokeless tobacco: Never Used     Comment: 02/27/2013 "smoking 2 cigarettes/day; that's down from 2 ppd"  . Alcohol Use: No      Comment: 02/27/2013 "nothing in 6 years; never had a problem w/it"   OB History   Grav Para Term Preterm Abortions TAB SAB Ect Mult Living                 Review of Systems  Constitutional:       Per HPI, otherwise negative  HENT:       Per HPI, otherwise negative  Respiratory:       Per HPI, otherwise negative  Cardiovascular:       Per HPI, otherwise negative  Gastrointestinal: Negative for vomiting.  Endocrine:       Negative aside from HPI  Genitourinary:       Neg aside from HPI   Musculoskeletal:       Per HPI, otherwise negative  Skin: Negative.   Neurological: Negative for syncope.      Allergies  Banana and Pollen extract  Home Medications   Prior to Admission medications   Medication Sig Start Date End Date Taking? Authorizing Provider  DULoxetine (CYMBALTA) 30 MG capsule Take 30 mg by mouth 2 (two) times daily.    Yes Historical Provider, MD  Fluticasone-Salmeterol (ADVAIR DISKUS) 250-50 MCG/DOSE AEPB Inhale  1 puff into the lungs 2 (two) times daily. 10/07/13  Yes Sid Falcon, MD  lamoTRIgine (LAMICTAL) 100 MG tablet Take 100-200 mg by mouth 2 (two) times daily. 1 tablet in the morning and 2 tablets at night   Yes Historical Provider, MD  albuterol (PROAIR HFA) 108 (90 BASE) MCG/ACT inhaler Inhale 1 puff into the lungs every 2 (two) hours as needed for wheezing or shortness of breath.    Historical Provider, MD  albuterol (PROVENTIL) (2.5 MG/3ML) 0.083% nebulizer solution Take 3 mLs (2.5 mg total) by nebulization every 4 (four) hours as needed. Shortness of breath 02/28/13   Lesly Dukes, MD  alendronate (FOSAMAX) 35 MG tablet Take 35 mg by mouth every 7 (seven) days. Take with a full glass of water on an empty stomach.    Historical Provider, MD  ALPRAZolam Duanne Moron) 0.25 MG tablet Take 1 tablet (0.25 mg total) by mouth 2 (two) times daily as needed for anxiety. 03/06/13   Mahima Bubba Camp, MD  AMBULATORY NON FORMULARY MEDICATION Continuous O2 @@ 3LPM     Historical Provider, MD  budesonide (PULMICORT) 0.5 MG/2ML nebulizer solution Take 0.5 mg by nebulization 4 (four) times daily.    Historical Provider, MD  calcium carbonate (OS-CAL) 600 MG TABS tablet Take 600 mg by mouth 2 (two) times daily with a meal.    Historical Provider, MD  cholecalciferol (VITAMIN D) 1000 UNITS tablet Take 1,000 Units by mouth daily. 06/09/11   Ansel Bong, MD  fexofenadine (ALLEGRA) 180 MG tablet Take 180 mg by mouth daily.    Historical Provider, MD  ipratropium-albuterol (DUONEB) 0.5-2.5 (3) MG/3ML SOLN Take 3 mLs by nebulization every 6 (six) hours. 04/26/13   Kathee Delton, MD  metoCLOPramide (REGLAN) 10 MG tablet Take 1 tablet (10 mg total) by mouth at bedtime. 01/01/13   Sable Feil, MD  OLANZapine (ZYPREXA) 5 MG tablet Take 5 mg by mouth at bedtime. 01/03/11   Ansel Bong, MD  omeprazole (PRILOSEC) 20 MG capsule Take 1 capsule (20 mg total) by mouth daily. 11/07/12   Jones Bales, MD  ondansetron (ZOFRAN) 4 MG tablet Take 1 tablet (4 mg total) by mouth every 8 (eight) hours as needed for nausea. 03/25/13   Rebecca Eaton, MD  OVER THE COUNTER MEDICATION Take 1 tablet by mouth daily. Bone health vitamin    Historical Provider, MD  oxyCODONE (ROXICODONE) 15 MG immediate release tablet Take 1 tablet (15 mg total) by mouth 4 (four) times daily. 05/14/13   Valaria Good, MD  predniSONE (DELTASONE) 20 MG tablet Take 2 tablets (40 mg total) by mouth daily. 10/16/13   Elwyn Lade, PA-C  Tapentadol HCl (NUCYNTA) 100 MG TABS Take 100 mg by mouth 4 (four) times daily.    Historical Provider, MD  zolpidem (AMBIEN) 5 MG tablet Take 1 tablet (5 mg total) by mouth at bedtime as needed for sleep. 04/30/12   Ansel Bong, MD   BP 141/60  Pulse 89  Temp(Src) 98.9 F (37.2 C) (Oral)  Resp 13  Ht 5\' 1"  (1.549 m)  Wt 9 lb (4.082 kg)  BMI 1.70 kg/m2  SpO2 94% Physical Exam  Nursing note and vitals reviewed. Constitutional: She is oriented to person, place, and  time. She appears ill.  HENT:  Head: Normocephalic and atraumatic.  Eyes: Conjunctivae and EOM are normal.  Cardiovascular: Normal rate and regular rhythm.   Pulmonary/Chest: Effort normal. No stridor. No respiratory distress. She has decreased breath sounds.  She has no wheezes.  Abdominal: She exhibits no distension.  Musculoskeletal: She exhibits no edema.  Neurological: She is alert and oriented to person, place, and time. No cranial nerve deficit.  Skin: Skin is warm and dry.  Psychiatric: She is slowed and withdrawn.    ED Course  Procedures (including critical care time) Labs Review Labs Reviewed  CBC - Abnormal; Notable for the following:    RBC 3.08 (*)    Hemoglobin 9.1 (*)    HCT 29.2 (*)    RDW 16.8 (*)    Platelets 617 (*)    All other components within normal limits  BASIC METABOLIC PANEL - Abnormal; Notable for the following:    Chloride 93 (*)    Creatinine, Ser 0.38 (*)    Calcium 8.0 (*)    Anion gap 18 (*)    All other components within normal limits  I-STAT TROPOININ, ED    Imaging Review Dg Chest 2 View (if Patient Has Fever And/or Copd)  11/08/2013   CLINICAL DATA:  Smoker shortness of breath  EXAM: CHEST  2 VIEW  COMPARISON:  10/16/2013  FINDINGS: Stable scoliosis. Heart size upper normal to mildly enlarged but stable. Hyperinflation indicate COPD. Nipple shadow right lung base noted. The right lung appears otherwise clear. On the left, there is lower lobe infiltrate.  IMPRESSION: Left lower lobe infiltrate concerning for superimposed on COPD.   Electronically Signed   By: Skipper Cliche M.D.   On: 11/08/2013 13:29     EKG Interpretation   Date/Time:  Friday November 08 2013 12:21:11 EDT Ventricular Rate:  88 PR Interval:  130 QRS Duration: 71 QT Interval:  402 QTC Calculation: 486 R Axis:   86 Text Interpretation:  Sinus rhythm Multiform ventricular premature  complexes Borderline right axis deviation Anteroseptal infarct, old  Borderline T  abnormalities, inferior leads Sinus rhythm Premature  ventricular complexes Artifact T wave abnormality No significant change  since last tracing Abnormal ekg Confirmed by Carmin Muskrat  MD [phone])  on 11/08/2013 1:28:41 PM      MDM   Final diagnoses:  CAP (community acquired pneumonia)    Patient presented with dyspnea.  On patient is initially hypoxic on her the low oxygen, though this improved with additional osseous port, albuterol therapy. Patient's evaluation demonstrates the presence of a new opacification concerning for pneumonia. Given the patient's improvement in oxygenation status, her otherwise reassuring vital signs, patient was admitted for further evaluation and management.    Carmin Muskrat, MD 11/08/13 1534

## 2013-11-08 NOTE — ED Notes (Signed)
Admitting at bedside 

## 2013-11-08 NOTE — Progress Notes (Signed)
Found pt. To be on 9L of oxygen. Titrated pt. Down to 4. Pt. sats are in the high 90's.

## 2013-11-08 NOTE — Progress Notes (Signed)
Pt. Stated she did not want to be awaken throughout the night and that she takes tx at home 4 times a day. Pt. Tx. Schedule changed to fit pt. Home regimen and to satisfy pt. Pt. Also given a prn tx order in case she needs it.

## 2013-11-09 ENCOUNTER — Encounter (HOSPITAL_COMMUNITY): Payer: Self-pay | Admitting: Internal Medicine

## 2013-11-09 LAB — BASIC METABOLIC PANEL
Anion gap: 10 (ref 5–15)
BUN: 8 mg/dL (ref 6–23)
CALCIUM: 8 mg/dL — AB (ref 8.4–10.5)
CHLORIDE: 97 meq/L (ref 96–112)
CO2: 33 mEq/L — ABNORMAL HIGH (ref 19–32)
CREATININE: 0.33 mg/dL — AB (ref 0.50–1.10)
GFR calc non Af Amer: >90 mL/min (ref >90)
Glucose, Bld: 117 mg/dL — ABNORMAL HIGH (ref 70–99)
Potassium: 4.5 mEq/L (ref 3.7–5.3)
Sodium: 140 mEq/L (ref 137–147)

## 2013-11-09 LAB — CBC
HCT: 25.6 % — ABNORMAL LOW (ref 36.0–46.0)
Hemoglobin: 7.9 g/dL — ABNORMAL LOW (ref 12.0–15.0)
MCH: 29.2 pg (ref 26.0–34.0)
MCHC: 30.9 g/dL (ref 30.0–36.0)
MCV: 94.5 fL (ref 78.0–100.0)
Platelets: 590 10*3/uL — ABNORMAL HIGH (ref 150–400)
RBC: 2.71 MIL/uL — ABNORMAL LOW (ref 3.87–5.11)
RDW: 16.6 % — ABNORMAL HIGH (ref 11.5–15.5)
WBC: 5.5 10*3/uL (ref 4.0–10.5)

## 2013-11-09 LAB — RETICULOCYTES
RBC.: 2.72 MIL/uL — AB (ref 3.87–5.11)
RETIC COUNT ABSOLUTE: 70.7 10*3/uL (ref 19.0–186.0)
RETIC CT PCT: 2.6 % (ref 0.4–3.1)

## 2013-11-09 LAB — VITAMIN B12: Vitamin B-12: 961 pg/mL — ABNORMAL HIGH (ref 211–911)

## 2013-11-09 LAB — FERRITIN: Ferritin: 10 ng/mL (ref 10–291)

## 2013-11-09 LAB — FOLATE: FOLATE: 13.7 ng/mL

## 2013-11-09 MED ORDER — LEVOFLOXACIN 750 MG PO TABS
750.0000 mg | ORAL_TABLET | Freq: Every day | ORAL | Status: DC
  Administered 2013-11-10 – 2013-11-11 (×2): 750 mg via ORAL
  Filled 2013-11-09 (×2): qty 1

## 2013-11-09 NOTE — Evaluation (Signed)
Physical Therapy Evaluation Patient Details Name: [name] MRN: [id number] DOB: 08-18-1950 Today's Date: 11/09/2013   History of Present Illness  63 y.o. female admitted for community-acquired pneumonia.  She has h/o COPD and is on continuous O2 at home.  Clinical Impression  Pt presents with generalized weakness and decreased mobility.  Pt very motivated to return to her apartment.  She has all needed home equipment.  PT to see 3 x week to address mobility deficits.  Recommend HHPT upon d/c.    Follow Up Recommendations Home health PT;Supervision - Intermittent    Equipment Recommendations  None recommended by PT    Recommendations for Other Services       Precautions / Restrictions Precautions Precautions: None      Mobility  Bed Mobility Overal bed mobility: Needs Assistance Bed Mobility: Supine to Sit     Supine to sit: HOB elevated;Min guard        Transfers Overall transfer level: Needs assistance Equipment used: Rolling walker (2 wheeled) Transfers: Sit to/from [facility] Sit to Stand: Min assist Stand pivot transfers: Min assist          Ambulation/Gait Ambulation/Gait assistance: Min assist Ambulation Distance (Feet): 35 Feet Assistive device: Rolling walker (2 wheeled) Gait Pattern/deviations: Decreased stride length;Step-through pattern Gait velocity: assist with RW management Gait velocity interpretation: Below normal speed for age/gender    Stairs            Wheelchair Mobility    Modified Rankin (Stroke Patients Only)       Balance                                             Pertinent Vitals/Pain Pain Assessment: No/denies pain    Home Living Family/patient expects to be discharged to:: Private residence Living Arrangements: Alone Available Help at Discharge: Friend(s) Type of Home: Apartment Home Access: Elevator     Home Layout: One level Home Equipment: Environmental consultant - 4 wheels     Prior Function Level of Independence: Independent with assistive device(s)               Hand Dominance   Dominant Hand: Right    Extremity/Trunk Assessment                         Communication   Communication: No difficulties  Cognition Arousal/Alertness: Awake/alert Behavior During Therapy: WFL for tasks assessed/performed Overall Cognitive Status: Within Functional Limits for tasks assessed                      General Comments      Exercises        Assessment/Plan    PT Assessment Patient needs continued PT services  PT Diagnosis Difficulty walking;Generalized weakness   PT Problem List Decreased strength;Decreased activity tolerance;Decreased balance;Decreased mobility;Cardiopulmonary status limiting activity  PT Treatment Interventions DME instruction;Gait training;Functional mobility training;Therapeutic activities;Therapeutic exercise;Balance training;Patient/family education   PT Goals (Current goals can be found in the Care Plan section) Acute Rehab PT Goals Patient Stated Goal: home PT Goal Formulation: With patient Time For Goal Achievement: 11/23/13 Potential to Achieve Goals: Good    Frequency Min 3X/week   Barriers to discharge        Co-evaluation               End of  Session Equipment Utilized During Treatment: Gait belt Activity Tolerance: Patient limited by fatigue Patient left: in chair;with call bell/phone within reach Nurse Communication: Mobility status         Time: 1002-1024 PT Time Calculation (min): 22 min   Charges:   PT Evaluation $Initial PT Evaluation Tier I: 1 Procedure PT Treatments $Gait Training: 8-22 mins   PT G Codes:          Lorriane Shire 11/09/2013, 10:53 AM  Lorrin Goodell, PT  Office # [phone] Pager [phone]

## 2013-11-09 NOTE — Progress Notes (Addendum)
Subjective: No acute events overnight. She is doing well this AM and is sitting in a chair watching her favorite TV show, [employer]. With regards to her Hb, she tells me her father died of myelofibrosis but denies hematochezia, melena. She does recall going to a GI doctor a year ago though did not feel it was helpful.   Objective: Vital signs in last 24 hours: Filed Vitals:   11/09/13 0835 11/09/13 0848 11/09/13 1210 11/09/13 1256  BP:    130/57  Pulse:    91  Temp:    98.5 F (36.9 C)  TempSrc:    Oral  Resp:    93  Height:      Weight: 91 lb 4.3 oz (41.4 kg)     SpO2:  86% 92% 93%   Weight change:   Intake/Output Summary (Last 24 hours) at 11/09/13 1456 Last data filed at 11/09/13 1255  Gross per 24 hour  Intake   1440 ml  Output    250 ml  Net   1190 ml   General: sitting in chair, 4L O2 by [place] HEENT: PERRL, EOMI, no scleral icterus Cardiac: RRR, no rubs, murmurs or gallops Pulm: generalized wheezing in all lung fields though without rhonchi today, minimal to moderate air flow Abd: soft, nontender, nondistended, BS present Ext: warm and well perfused, no pedal edema Neuro: responds to questions appropriately; moving all extremities freely  Lab Results: Basic Metabolic Panel:  Recent Labs Lab 11/08/13 1227 11/09/13 0650  NA 138 140  K 3.8 4.5  CL 93* 97  CO2 27 33*  GLUCOSE 97 117*  BUN 9 8  CREATININE 0.38* 0.33*  CALCIUM 8.0* 8.0*   CBC:  Recent Labs Lab 11/08/13 1227 11/09/13 0650  WBC 9.8 5.5  HGB 9.1* 7.9*  HCT 29.2* 25.6*  MCV 94.8 94.5  PLT 617* 590*   Anemia Panel:  Recent Labs Lab 11/09/13 0850  RETICCTPCT 2.6    Micro Results: Recent Results (from the past 240 hour(s))  CULTURE, BLOOD (SINGLE)     Status: None   Collection Time    11/08/13  4:00 PM      Result Value Ref Range Status   Specimen Description BLOOD RIGHT ARM   Final   Special Requests BOTTLES DRAWN AEROBIC AND ANAEROBIC 10CC EACH   Final   Culture  Setup  Time     Final   Value: 11/08/2013 19:17     Performed at [facility]   Culture     Final   Value:        BLOOD CULTURE RECEIVED NO GROWTH TO DATE CULTURE WILL BE HELD FOR 5 DAYS BEFORE ISSUING A FINAL NEGATIVE REPORT     Performed at [facility]   Report Status PENDING   Incomplete   Studies/Results: Dg Chest 2 View (if Patient Has Fever And/or Copd)  11/08/2013   CLINICAL DATA:  Smoker shortness of breath  EXAM: CHEST  2 VIEW  COMPARISON:  10/16/2013  FINDINGS: Stable scoliosis. Heart size upper normal to mildly enlarged but stable. Hyperinflation indicate COPD. Nipple shadow right lung base noted. The right lung appears otherwise clear. On the left, there is lower lobe infiltrate.  IMPRESSION: Left lower lobe infiltrate concerning for superimposed on COPD.   Electronically Signed   By: Skipper Cliche M.D.   On: 11/08/2013 13:29   Medications: I have reviewed the patient's current medications. Scheduled Meds: . albuterol  2.5 mg Nebulization QID  . DULoxetine  30 mg Oral BID  . heparin  5,000 Units Subcutaneous 3 times per day  . ipratropium  0.5 mg Nebulization QID  . levofloxacin (LEVAQUIN) IV  750 mg Intravenous Q24H  . loratadine  10 mg Oral Daily  . methylPREDNISolone (SOLU-MEDROL) injection  60 mg Intravenous Q12H  . metoCLOPramide  10 mg Oral BID  . mometasone-formoterol  2 puff Inhalation BID  . oxyCODONE  15 mg Oral QID  . zolpidem  5 mg Oral QHS   Continuous Infusions:  PRN Meds:.albuterol, guaiFENesin-dextromethorphan, ondansetron (ZOFRAN) IV, ondansetron Assessment/Plan: Principal Problem:   CAP (community acquired pneumonia) Active Problems:   OSTEOPOROSIS   BIPOLAR AFFECTIVE DISORDER, DEPRESSED, HX OF   COPD (chronic obstructive pulmonary disease) with emphysema   Chronic pain   Protein-calorie malnutrition, severe  [name] is a 63 year old female with COPD (GOLD Stage IV on 3L O2), tobacco abuse, osteoporosis, GERD, chronic pain,  depression, bipolar disorder hospitalized for acute-on-chronic respiratory failure 2/2 CAP in the setting of COPD exacerbation found to have anemia.   #Acute-on-chronic respiratory failure: Her oxygen requirement is higher than at home indicate suboptimal respiratory function though she sounds better than yesterday. She is not in respiratory distress which is reassuring. -Abx 2/5: continue Levaquin 750mg  IV; transition to PO tomorrow -Sputum & blood cultures NGTD x 1 day -Continue O2, home meds, & pulse oximetry  -Monitor on telemetry   #Anemia: Hb 7.9 today, down from 9.1 on admission and declining from 13.9 (10/11/11). She reports the severity of her COPD precluded her from getting a colonoscopy though denies symptoms of occult lower GI bleeding. Her family history is concerning for MDS, especially given the increase in platelets 500+ over the past month.  -Order anemia panel -Recheck CBC & smear  #Tobacco abuse: Continue advising against her tobacco use.   #Osteoporosis: Continue Vitamin D & Ca supplementation.   #Bipolar Disorder: Continue Cymbalta 30mg . .   #GERD: Continue Protonix 40mg  & Reglan 10mg  BID.   #Chronic pain: Continue oxycodone 15mg  QID.  #FEN:  -Diet: Regular  #DVT prophylaxis: heparin 5000 units subcutaneous  #CODE STATUS: DNR/DNI   Dispo: Disposition is deferred at this time, awaiting improvement of current medical problems.  Anticipated discharge in approximately 1-2 day(s).   The patient does have a current PCP (Charlott Rakes, MD) and does need an [facility] hospital follow-up appointment after discharge.  The patient does have transportation limitations that hinder transportation to clinic appointments.  .Services Needed at time of discharge: Y = Yes, Blank = No PT: Home Health  OT:   RN:   Equipment:   Other:     LOS: 1 day   Charlott Rakes, MD 11/09/2013, 2:56 PM

## 2013-11-09 NOTE — Progress Notes (Signed)
  Date: 11/09/2013  Patient name: [name]  Medical record number: [id number]  Date of birth: 1950/06/03   I have seen and evaluated [name] and discussed their care with the Residency Team. [name] is well known to me. She has O2 dep COPD, osteoporosis, chronic pain managed with opioids, and bipolar. She lives independently. She had not been feeling well for a few days and came to the ED. A CXR dx a LLL infiltrate and she was admitted for CAP. She has decreased her tobacco use from 2 PPD to 2 cigs per day by will [employer].   On exam, she remains afebrile. Her O2 sat is is the 90's on 4-5 L by [place]. Her BP is nl at 130/57. She has fair air flow and diffuse exp wheezing along with diffuse rhonchi. She is taking in full sentences and does not use accessory muscles. Her ABD + BS, soft, appreciate ABD aortic pulse.   Assessment and Plan: I have seen and evaluated the patient as outlined above. I agree with the formulated Assessment and Plan as detailed in the residents' admission note, with the following changes:   1. Acute on chronic O2 dependent resp failure 2/2 COPD exac 2/2 CAP - agree with current mgmt of Levaquin, steroids, nebs, and O2. Jordan [name] will likely require a few days to improve due to the underlying pul dz.   2. + ABD aorta pulsation - she does have a sig tobacco hx. However, she had a CT 11/2012 that did not show an ABD aneurysm and an U/S 10/13 that also showed a nl aorta. Guidelines do not rec screening women and she had nl imaging one year ago. Likely 2/2 thin body habitus.   Bartholomew Crews, MD 8/29/20151:27 PM

## 2013-11-10 LAB — BASIC METABOLIC PANEL
Anion gap: 8 (ref 5–15)
BUN: 9 mg/dL (ref 6–23)
CALCIUM: 8.2 mg/dL — AB (ref 8.4–10.5)
CO2: 34 mEq/L — ABNORMAL HIGH (ref 19–32)
CREATININE: 0.32 mg/dL — AB (ref 0.50–1.10)
Chloride: 96 mEq/L (ref 96–112)
GFR calc non Af Amer: >90 mL/min (ref >90)
Glucose, Bld: 147 mg/dL — ABNORMAL HIGH (ref 70–99)
Potassium: 4.3 mEq/L (ref 3.7–5.3)
SODIUM: 138 meq/L (ref 137–147)

## 2013-11-10 LAB — CBC
HCT: 24.7 % — ABNORMAL LOW (ref 36.0–46.0)
Hemoglobin: 7.8 g/dL — ABNORMAL LOW (ref 12.0–15.0)
MCH: 30.7 pg (ref 26.0–34.0)
MCHC: 31.6 g/dL (ref 30.0–36.0)
MCV: 97.2 fL (ref 78.0–100.0)
PLATELETS: 569 10*3/uL — AB (ref 150–400)
RBC: 2.54 MIL/uL — AB (ref 3.87–5.11)
RDW: 16.9 % — ABNORMAL HIGH (ref 11.5–15.5)
WBC: 12.5 10*3/uL — ABNORMAL HIGH (ref 4.0–10.5)

## 2013-11-10 LAB — IRON AND TIBC
Iron: 14 ug/dL — ABNORMAL LOW (ref 42–135)
SATURATION RATIOS: 4 % — AB (ref 20–55)
TIBC: 360 ug/dL (ref 250–470)
UIBC: 346 ug/dL (ref 125–400)

## 2013-11-10 MED ORDER — DOCUSATE SODIUM 100 MG PO CAPS
100.0000 mg | ORAL_CAPSULE | Freq: Two times a day (BID) | ORAL | Status: DC
  Administered 2013-11-10 – 2013-11-11 (×3): 100 mg via ORAL
  Filled 2013-11-10 (×3): qty 1

## 2013-11-10 MED ORDER — FERROUS SULFATE 325 (65 FE) MG PO TABS
325.0000 mg | ORAL_TABLET | Freq: Three times a day (TID) | ORAL | Status: DC
  Administered 2013-11-10 – 2013-11-11 (×4): 325 mg via ORAL
  Filled 2013-11-10 (×6): qty 1

## 2013-11-10 NOTE — Progress Notes (Signed)
Pt was found standing next to her bed. She had voided on the floor and was disoriented. Pt taken to BR and was oriented by the time she returned to bed. Although she was sedated from the [place], she insisted on taking her oxycodone for her back pain which she rated as a 10. Bed alarms on for pt safety.

## 2013-11-10 NOTE — Progress Notes (Signed)
Clinical Social Worker (CSW) met with patient to discuss D/C plan. Patient reported that she lives in an [address] and older apartment complex in [place]. Per patient an aide comes 1-2 hours daily. Patient plans to D/C home with home health services. CSW made RN case manager aware of above. Please reconsult if future social work needs arise. CSW signing off.   Blima Rich, [place] Weekend CSW [phone]

## 2013-11-10 NOTE — Progress Notes (Addendum)
Subjective: She was somnolent last night after taking her Ambien and had one episode of urinary incontinence while trying to find the bathroom but denies being confused at that time with no syncope or fall. She feels back to baseline this morning, reports that her breathing is improving.   Objective: Vital signs in last 24 hours: Filed Vitals:   11/09/13 2011 11/10/13 0212 11/10/13 0543 11/10/13 0842  BP: 121/65  118/55   Pulse: 79  74   Temp: 97.3 F (36.3 C)  98 F (36.7 C)   TempSrc: Axillary  Oral   Resp: 10  14   Height:      Weight:  90 lb 12.8 oz (41.187 kg)    SpO2: 96%  100% 93%   Weight change: 82 lb 4.3 oz (37.318 kg)  Intake/Output Summary (Last 24 hours) at 11/10/13 1044 Last data filed at 11/10/13 0800  Gross per 24 hour  Intake    940 ml  Output    300 ml  Net    640 ml  Vitals revieiwed General: sitting in bed in NAD HEENT:  no scleral icterus  Cardiac: RRR, no rubs, murmurs or gallops  Pulm: generalized wheezing in all lung fields though without rhonchi today, with moderate air flow  Abd: soft, nontender, nondistended, BS present  Ext: warm and well perfused, no pedal edema  Neuro: responds to questions appropriately; moving all extremities voluntairly  Lab Results: Basic Metabolic Panel:  Recent Labs Lab 11/09/13 0650 11/10/13 0501  NA 140 138  K 4.5 4.3  CL 97 96  CO2 33* 34*  GLUCOSE 117* 147*  BUN 8 9  CREATININE 0.33* 0.32*  CALCIUM 8.0* 8.2*   CBC:  Recent Labs Lab 11/09/13 0650 11/10/13 0501  WBC 5.5 12.5*  HGB 7.9* 7.8*  HCT 25.6* 24.7*  MCV 94.5 97.2  PLT 590* 569*   Anemia Panel:  Recent Labs Lab 11/09/13 0850  VITAMINB12 961*  FOLATE 13.7  FERRITIN 10  TIBC 360  IRON 14*  RETICCTPCT 2.6    Micro Results: Recent Results (from the past 240 hour(s))  CULTURE, BLOOD (SINGLE)     Status: None   Collection Time    11/08/13  4:00 PM      Result Value Ref Range Status   Specimen Description BLOOD RIGHT ARM    Final   Special Requests BOTTLES DRAWN AEROBIC AND ANAEROBIC 10CC EACH   Final   Culture  Setup Time     Final   Value: 11/08/2013 19:17     Performed at [place]     Final   Value:        BLOOD CULTURE RECEIVED NO GROWTH TO DATE CULTURE WILL BE HELD FOR 5 DAYS BEFORE ISSUING A FINAL NEGATIVE REPORT     Performed at [facility]   Report Status PENDING   Incomplete   Studies/Results: Dg Chest 2 View (if Patient Has Fever And/or Copd)  11/08/2013   CLINICAL DATA:  Smoker shortness of breath  EXAM: CHEST  2 VIEW  COMPARISON:  10/16/2013  FINDINGS: Stable scoliosis. Heart size upper normal to mildly enlarged but stable. Hyperinflation indicate COPD. Nipple shadow right lung base noted. The right lung appears otherwise clear. On the left, there is lower lobe infiltrate.  IMPRESSION: Left lower lobe infiltrate concerning for superimposed on COPD.   Electronically Signed   By: Skipper Cliche M.D.   On: 11/08/2013 13:29   Medications: I have reviewed  the patient's current medications. Scheduled Meds: . albuterol  2.5 mg Nebulization QID  . DULoxetine  30 mg Oral BID  . heparin  5,000 Units Subcutaneous 3 times per day  . ipratropium  0.5 mg Nebulization QID  . levofloxacin  750 mg Oral Daily  . loratadine  10 mg Oral Daily  . methylPREDNISolone (SOLU-MEDROL) injection  60 mg Intravenous Q12H  . metoCLOPramide  10 mg Oral BID  . mometasone-formoterol  2 puff Inhalation BID  . oxyCODONE  15 mg Oral QID  . zolpidem  5 mg Oral QHS   Continuous Infusions:  PRN Meds:.albuterol, guaiFENesin-dextromethorphan, ondansetron (ZOFRAN) IV, ondansetron Assessment/Plan: [name] is a 63 year old female with COPD (GOLD Stage IV on 3L O2), tobacco abuse, osteoporosis, GERD, chronic pain, depression, bipolar disorder hospitalized for acute-on-chronic respiratory failure 2/2 CAP in the setting of COPD exacerbation found to have anemia.   #Acute-on-chronic respiratory failure:  She reports that her breathing is improving. She is still on 4L O2 supplementation but with  100% saturation. Blood culture from 8/28 with NGTD -Abx 3/5: Continue Levaquin 750mg  will transition to PO today -Continue O2, home meds, & pulse oximetry  -Wean off O2 as tolerated, will need O2 with ambulation prior to discharge -Monitor on telemetry   #Iron deficiency Anemia: Hgb stable at 7.8 but from 9.1 on presentation. She has had steady decline 13.9 on 10/11/11 with no s/s of bleeding. Per GI, colonoscopy and ED contraindicated given her severe pulmonary disease. She has severe protein-calorie malnutrition, likely 2/2 to her advanced COPD. Her anemia panel is remarkable for low iron level of 14, ferritin of 10, normal folate, mildly elevated B12.  -f/u on blood smear -Start iron supplementation with colace BID for constipation prophylaxis -Nutrition consult   # Severe protein-calorie malnutrition: Likely due to her advanced COPD. Her BMI is 17. She is cachetic. She has iron deficiency anemia.  -Nutrition consult.   #Tobacco abuse: Continue advising against her tobacco use.  #Bipolar Disorder: Continue Cymbalta 30mg .   #GERD: Continue Protonix 40mg  & Reglan 10mg  BID.   #Chronic pain: Continue oxycodone 15mg  QID.   #FEN:  -Diet: Regular   #DVT prophylaxis: heparin 5000 units subcutaneous   #CODE STATUS: DNR/DNI  Dispo: Disposition is deferred at this time, awaiting improvement of current medical problems.  Anticipated discharge in approximately 1-2 day(s).   The patient does have a current PCP (Charlott Rakes, MD) and does need an [facility] hospital follow-up appointment after discharge.  The patient does have transportation limitations that hinder transportation to clinic appointments.  .Services Needed at time of discharge: Y = Yes, Blank = No PT:   OT:   RN:   Equipment:   Other:     LOS: 2 days   Blain Pais, MD 11/10/2013, 10:44 AM

## 2013-11-11 DIAGNOSIS — F319 Bipolar disorder, unspecified: Secondary | ICD-10-CM

## 2013-11-11 DIAGNOSIS — J9620 Acute and chronic respiratory failure, unspecified whether with hypoxia or hypercapnia: Secondary | ICD-10-CM

## 2013-11-11 DIAGNOSIS — E46 Unspecified protein-calorie malnutrition: Secondary | ICD-10-CM

## 2013-11-11 DIAGNOSIS — M810 Age-related osteoporosis without current pathological fracture: Secondary | ICD-10-CM

## 2013-11-11 DIAGNOSIS — R0902 Hypoxemia: Secondary | ICD-10-CM

## 2013-11-11 DIAGNOSIS — D509 Iron deficiency anemia, unspecified: Secondary | ICD-10-CM

## 2013-11-11 DIAGNOSIS — J189 Pneumonia, unspecified organism: Principal | ICD-10-CM

## 2013-11-11 DIAGNOSIS — G8929 Other chronic pain: Secondary | ICD-10-CM

## 2013-11-11 LAB — CBC
HCT: 25.4 % — ABNORMAL LOW (ref 36.0–46.0)
Hemoglobin: 8 g/dL — ABNORMAL LOW (ref 12.0–15.0)
MCH: 29.7 pg (ref 26.0–34.0)
MCHC: 31.5 g/dL (ref 30.0–36.0)
MCV: 94.4 fL (ref 78.0–100.0)
Platelets: 629 10*3/uL — ABNORMAL HIGH (ref 150–400)
RBC: 2.69 MIL/uL — ABNORMAL LOW (ref 3.87–5.11)
RDW: 17.1 % — AB (ref 11.5–15.5)
WBC: 11.3 10*3/uL — AB (ref 4.0–10.5)

## 2013-11-11 MED ORDER — LEVOFLOXACIN 750 MG PO TABS: 750.0000 mg | ORAL_TABLET | Freq: Every day | ORAL | Status: DC

## 2013-11-11 MED ORDER — OXYCODONE HCL 5 MG PO TABS
15.0000 mg | ORAL_TABLET | Freq: Four times a day (QID) | ORAL | Status: DC
  Administered 2013-11-11 (×2): 15 mg via ORAL
  Filled 2013-11-11 (×2): qty 3

## 2013-11-11 MED ORDER — PREDNISONE 20 MG PO TABS
40.0000 mg | ORAL_TABLET | Freq: Every day | ORAL | Status: DC
  Filled 2013-11-11: qty 2

## 2013-11-11 MED ORDER — PREDNISONE 20 MG PO TABS: 40.0000 mg | ORAL_TABLET | Freq: Every day | ORAL | Status: DC

## 2013-11-11 MED ORDER — METHYLPREDNISOLONE SODIUM SUCC 125 MG IJ SOLR: 60.0000 mg | INTRAMUSCULAR | Status: DC

## 2013-11-11 MED ORDER — FERROUS SULFATE 325 (65 FE) MG PO TABS: 325.0000 mg | ORAL_TABLET | Freq: Three times a day (TID) | ORAL | Status: DC

## 2013-11-11 MED ORDER — DSS 100 MG PO CAPS: 100.0000 mg | ORAL_CAPSULE | Freq: Two times a day (BID) | ORAL | Status: DC

## 2013-11-11 MED ORDER — ZOLPIDEM TARTRATE 5 MG PO TABS: 2.5000 mg | ORAL_TABLET | Freq: Every day | ORAL | Status: DC

## 2013-11-11 MED ORDER — METHYLPREDNISOLONE SODIUM SUCC 125 MG IJ SOLR
60.0000 mg | INTRAMUSCULAR | Status: DC
  Administered 2013-11-11: 60 mg via INTRAVENOUS
  Filled 2013-11-11 (×2): qty 0.96

## 2013-11-11 NOTE — Discharge Summary (Signed)
Name: [name] MRN: [id number] DOB: 1950-05-24 63 y.o. PCP: Charlott Rakes, MD  Date of Admission: 11/08/2013 12:07 PM Date of Discharge: 11/11/2013 Attending Physician: Larey Dresser, MD  Discharge Diagnosis: Principal Problem:   Acute on chronic respiratory failure with hypoxia Active Problems:   OSTEOPOROSIS   BIPOLAR AFFECTIVE DISORDER, DEPRESSED, HX OF   COPD (chronic obstructive pulmonary disease) with emphysema   Chronic pain   Protein-calorie malnutrition, severe   CAP (community acquired pneumonia)  Discharge Medications:   Medication List         PROAIR HFA 108 (90 BASE) MCG/ACT inhaler  Generic drug:  albuterol  Inhale 1 puff into the lungs every 2 (two) hours as needed for wheezing or shortness of breath.     albuterol (2.5 MG/3ML) 0.083% nebulizer solution  Commonly known as:  PROVENTIL  Take 3 mLs (2.5 mg total) by nebulization every 4 (four) hours as needed. Shortness of breath     AMBULATORY NON FORMULARY MEDICATION  Continuous O2 @@ 3LPM     calcium carbonate 600 MG Tabs tablet  Commonly known as:  OS-CAL  Take 600 mg by mouth 2 (two) times daily with a meal.     cholecalciferol 1000 UNITS tablet  Commonly known as:  VITAMIN D  Take 1,000 Units by mouth daily.     DSS 100 MG Caps  Take 100 mg by mouth 2 (two) times daily.     DULoxetine 30 MG capsule  Commonly known as:  CYMBALTA  Take 30 mg by mouth 2 (two) times daily.     ferrous sulfate 325 (65 FE) MG tablet  Take 1 tablet (325 mg total) by mouth 3 (three) times daily with meals.     fexofenadine 180 MG tablet  Commonly known as:  ALLEGRA  Take 180 mg by mouth daily.     Fluticasone-Salmeterol 250-50 MCG/DOSE Aepb  Commonly known as:  ADVAIR DISKUS  Inhale 1 puff into the lungs 2 (two) times daily.     levofloxacin 750 MG tablet  Commonly known as:  LEVAQUIN  Take 1 tablet (750 mg total) by mouth daily.     metoCLOPramide 10 MG tablet  Commonly known as:  REGLAN  Take  10 mg by mouth 2 (two) times daily.     metoCLOPramide 10 MG tablet  Commonly known as:  REGLAN  Take 1 tablet (10 mg total) by mouth at bedtime.     NUCYNTA 100 MG Tabs  Generic drug:  Tapentadol HCl  Take 100 mg by mouth 4 (four) times daily.     ondansetron 4 MG tablet  Commonly known as:  ZOFRAN  Take 1 tablet (4 mg total) by mouth every 8 (eight) hours as needed for nausea.     OVER THE COUNTER MEDICATION  Take 1 tablet by mouth daily. Bone health vitamin     oxyCODONE 15 MG immediate release tablet  Commonly known as:  ROXICODONE  Take 1 tablet (15 mg total) by mouth 4 (four) times daily.     predniSONE 20 MG tablet  Commonly known as:  DELTASONE  Take 2 tablets (40 mg total) by mouth daily with breakfast.     zolpidem 5 MG tablet  Commonly known as:  AMBIEN  Take 0.5 tablets (2.5 mg total) by mouth at bedtime.        Disposition and follow-up:   [name] was discharged from [facility] in Stable condition.  At the hospital follow up visit  please address:  1. Respiratory status: O2 requirement, prednisone/Levaquin completion  2. Disorientation/confusion: Ambien taper  3. Anemia: adherence to Fe tabs & Colace  4. Labs / imaging needed at time of follow-up: blood smear  5. Pending labs/ test needing follow-up: none  Follow-up Appointments: Follow-up Information   Follow up with Jenetta Downer, MD On 11/15/2013. [phone])    Specialty:  Internal Medicine   Contact information:   [place]  [phone]       Discharge Instructions: Discharge Instructions   Call MD for:  difficulty breathing, headache or visual disturbances    Complete by:  As directed      Call MD for:  persistant dizziness or light-headedness    Complete by:  As directed      Call MD for:  persistant nausea and vomiting    Complete by:  As directed      Call MD for:  temperature >100.4    Complete by:  As directed      Diet - low  sodium heart healthy    Complete by:  As directed      Increase activity slowly    Complete by:  As directed            Consultations:    Procedures Performed:  Dg Chest 2 View (if Patient Has Fever And/or Copd)  11/08/2013   CLINICAL DATA:  Smoker shortness of breath  EXAM: CHEST  2 VIEW  COMPARISON:  10/16/2013  FINDINGS: Stable scoliosis. Heart size upper normal to mildly enlarged but stable. Hyperinflation indicate COPD. Nipple shadow right lung base noted. The right lung appears otherwise clear. On the left, there is lower lobe infiltrate.  IMPRESSION: Left lower lobe infiltrate concerning for superimposed on COPD.   Electronically Signed   By: Skipper Cliche M.D.   On: 11/08/2013 13:29   Dg Chest 2 View (if Patient Has Fever And/or Copd)  10/16/2013   CLINICAL DATA:  Shortness of breath and cough  EXAM: CHEST  2 VIEW  COMPARISON:  March 02, 2013  FINDINGS: There is underlying emphysematous change with extensive bullous disease in the upper lobes bilaterally. There is no edema or consolidation. Interstitial prominence in the lower lung zones is in large part is due to redistribution of blood flow to viable segments of lung. There is, however, a somewhat vague area of increased opacity in the right middle lobe seen on both frontal and lateral views. On the frontal view, this area measures approximately 3 x 2.5 cm. Elsewhere lungs are clear.  Heart size is within normal limits. Pulmonary vascularity reflects underlying emphysema. No adenopathy. No bone lesions.  IMPRESSION: Underlying emphysema. Vague opacity in the right middle lobe region which warrants correlation with noncontrast enhanced chest CT to further assess. Elsewhere lungs are grossly clear. Mild interstitial prominence the lower lobes in large part is felt to be due to redistribution of blood flow to viable segments of lung given the degree of bullous disease in the upper lobes diffusely.   Electronically Signed   By: Lowella Grip M.D.   On: 10/16/2013 11:32   Ct Chest Wo Contrast  10/16/2013   CLINICAL DATA:  Increased shortness of breath. History of productive cough.  EXAM: CT CHEST WITHOUT CONTRAST  TECHNIQUE: Multidetector CT imaging of the chest was performed following the standard protocol without IV contrast.  COMPARISON:  Chest radiograph 10/16/2013.  Abdominal CT 09/04/2008  FINDINGS: There is a 1.6 cm low-density nodule in  the right thyroid lobe. There was a nodule in this region on a cervical spine CT from 10/25/2010 and the nodule previously measured roughly 1.2 cm. Patient has bilateral breast implants. There does not appear to be significant mediastinal lymphadenopathy. No significant pericardial or pleural fluid. There is a punctate calcification in the left kidney mid pole but no evidence for hydronephrosis. No acute abnormalities in the upper abdomen.  Patient has severe bullous emphysema, particularly in the upper lobes. Trachea and mainstem bronchi are patent. No suspicious pulmonary nodules. No significant airspace disease or consolidation. There is a stable punctate pleural-based density in the left lower lobe superior segment on sequence 3, image 32. This is likely benign based on the stability. No acute bone abnormality.  IMPRESSION: Severe emphysematous disease without an acute chest abnormality.  Punctate left kidney stone or calcification.  1.6 cm right thyroid nodule. This nodule may have enlarged since 2012. This nodule could be further characterized with a thyroid ultrasound.   Electronically Signed   By: Markus Daft M.D.   On: 10/16/2013 15:07    Admission HPI: Ms. Witters is a 63 year old female with COPD (GOLD Stage IV on 3L O2), tobacco abuse, osteoporosis, GERD, chronic pain, depression, bipolar disorder who presents to the ED today for "not feeling well" over the last 2-3 days.   Over the last several days, she reports decreased appetite, nausea, subjective fevers, vomiting, and worsening  productive cough but denies any sick contacts. At home, she takes Duoneb nebulizer treatments QID, albuterol prn for wheezing, Advair 1 puff BID. She was referred to pulmonary rehab by Dr. Gwenette Greet (Pulmonology) per his note but did not go due to cost. She has smoked since age 45 but has decreased her intake from 2 ppd to 2 cigarettes/day. She lives at [facility] in downtown [place] presumably by herself.   In the ED, CXR showed LLL infiltrate concerning for pneumonia and was started on Levaquin 500mg  IV. She also received Solumedrol 125mg  IM x 1.    Hospital Course by problem list: Principal Problem:   Acute on chronic respiratory failure with hypoxia Active Problems:   OSTEOPOROSIS   BIPOLAR AFFECTIVE DISORDER, DEPRESSED, HX OF   COPD (chronic obstructive pulmonary disease) with emphysema   Chronic pain   Protein-calorie malnutrition, severe   CAP (community acquired pneumonia)   #Acute on chronic respiratory failure with hypoxia: Likely 2/2 CAP in the setting of COPD (GOLD Stage IV). She required 4L O2 throughout her hospital stay, up from her home 3L O2, and at the time of discharge. Symptoms improved with home medication and was discharged on prednisone 40mg  x 5 days & Levaquin 750mg  x 6 days (total 10 days of abx). At night, she was found to have worsening dyspnea and disorientation likely 2/2 Ambien, so she was asked to take 1/2 her normal dose (5->2.5mg ) over the next several days prior to her follow-up appointment to assess improvement and potentially stopping the medication altogether.  #Fe deficiency anemia: Hb during this admission was stable at 7-8. Anemia panel was remarkable for Fe 14, ferritin 10. She was started on Fe tablets 325mg  TID and Colace 100mg  BID for constipation prophylaxis. Blood smear was ordered though may have been lost in processing as she noted her father died from myelofibrosis.   #Severe protein-calorie malnutrition: Likely 2/2 COPD. Per Nutrition recs, she  received Magic Cups TID between meals.  #Osteoporosis: Remained stable on home medications.  #Chronic pain: Remained stable on home medications.  #Bipolar disorder:  Remained stable on home medications.  Discharge Vitals:   BP 167/88  Pulse 75  Temp(Src) 97.7 F (36.5 C) (Oral)  Resp 16  Ht 5\' 1"  (1.549 m)  Wt 86 lb 11.2 oz (39.327 kg)  BMI 16.39 kg/m2  SpO2 96%  Discharge Labs:  No results found for this or any previous visit (from the past 24 hour(s)).  Signed: Charlott Rakes, MD 11/12/2013, 7:42 AM    Services Ordered on Discharge: [place] on Discharge: None

## 2013-11-11 NOTE — Progress Notes (Signed)
Utilization review completed.  

## 2013-11-11 NOTE — Progress Notes (Signed)
Chart and note reviewed. Agree with note.  Raynold Blankenbaker, RD, LDN, CNSC Pager [phone] After Hours Pager [phone]   

## 2013-11-11 NOTE — Progress Notes (Signed)
Pt again awakened during the night and was disoriented and very unsteady on her feet. She is able to quickly reorient however her speech is slow and she is more sob on exertion. Pt is aware that she is having these issues at night and agreed that perhaps the Lorrin Mais is causing the sedation. Pt encouraged to discuss this with her MD in the morning.

## 2013-11-11 NOTE — Progress Notes (Signed)
INITIAL NUTRITION ASSESSMENT  Pt meets criteria for SEVERE MALNUTRITION in the context of chronic illness as evidenced by severe fat and muscle mass loss.  DOCUMENTATION CODES Per approved criteria  -Severe malnutrition in the context of chronic illness -Underweight   INTERVENTION: Provide Magic cup TID between meals, each supplement provides 290 kcal and 9 grams of protein.  NUTRITION DIAGNOSIS: Increased nutrient needs related to chronic illness, COPD as evidenced by estimated nutrition needs.   Goal: Pt to meet >/= 90% of their estimated nutrition needs   Monitor:  PO intake, weight trends, labs, I/O's  Reason for Assessment: MD consult for assessment of nutrition requirements/status  63 y.o. female  Admitting Dx: CAP (community acquired pneumonia)  ASSESSMENT: Pt with PMH of COPD (GOLD Stage IV on 3L O2), tobacco abuse, osteoporosis, GERD, chronic pain, depression, bipolar disorder who presents to the ED today for "not feeling well" over the last 2-3 days. CXR showed LLL infiltrate concerning for pneumonia.  Pt reports having a good appetite. Meal completion is 50-100%. Breakfast this morning was 85% and she reports she is currently hungry for lunch. Pt reports she has also had a good appetite at home with eating 3 full meals a day along with snacks in between. Pt says she has not had any recent weight loss with her usual body weight of 85-95 lbs. Pt reports the heaviest she has ever weighed in her life is 95 lbs. Pt reports she would like snacks in between her meals. Pt reports she does not want Ensure or Boost. Pt is willing to try magic cup. Will order. Pt denies any nausea or stomach pains.   Nutrition Focused Physical Exam:  Subcutaneous Fat:  Orbital Region: N/A Upper Arm Region: Severe depletion Thoracic and Lumbar Region: Severe depletion  Muscle:  Temple Region: N/A Clavicle Bone Region: Severe depletion Clavicle and Acromion Bone Region: Severe  depletion Scapular Bone Region: Severe depletion Dorsal Hand: Severe depletion Patellar Region: Severe depletion Anterior Thigh Region: Severe depletion Posterior Calf Region: Severe depletion  Edema: none  Labs: Low creatinine and calcium. High CO2 and glucose (147 mg/dL).  Height: Ht Readings from Last 1 Encounters:  11/08/13 5\' 1"  (1.549 m)    Weight: Wt Readings from Last 1 Encounters:  11/11/13 86 lb 11.2 oz (39.327 kg)    Ideal Body Weight: 105 lbs  % Ideal Body Weight: 82%  Wt Readings from Last 10 Encounters:  11/11/13 86 lb 11.2 oz (39.327 kg)  05/14/13 84 lb (38.102 kg)  04/25/13 85 lb (38.556 kg)  04/17/13 83 lb 8 oz (37.875 kg)  03/28/13 83 lb 12.8 oz (38.011 kg)  03/19/13 81 lb 12.8 oz (37.104 kg)  03/06/13 80 lb 3.2 oz (36.378 kg)  02/26/13 78 lb 3.2 oz (35.471 kg)  01/01/13 79 lb (35.834 kg)  11/20/12 81 lb 8 oz (36.968 kg)    Usual Body Weight: 85-95 lbs  % Usual Body Weight: 91-101%  BMI:  Body mass index is 16.39 kg/(m^2). Underweight  Estimated Nutritional Needs: Kcal: 1610-9604 Protein: 60-75 grams Fluid: 1.4-1.6 L/day  Skin: no issues noted  Diet Order: General  EDUCATION NEEDS: -No education needs identified at this time   Intake/Output Summary (Last 24 hours) at 11/11/13 0859 Last data filed at 11/11/13 0245  Gross per 24 hour  Intake    820 ml  Output      0 ml  Net    820 ml    Last BM: 8/31  Labs:   Recent Labs  Lab 11/08/13 1227 11/09/13 0650 11/10/13 0501  NA 138 140 138  K 3.8 4.5 4.3  CL 93* 97 96  CO2 27 33* 34*  BUN 9 8 9   CREATININE 0.38* 0.33* 0.32*  CALCIUM 8.0* 8.0* 8.2*  GLUCOSE 97 117* 147*    CBG (last 3)  No results found for this basename: GLUCAP,  in the last 72 hours  Scheduled Meds: . albuterol  2.5 mg Nebulization QID  . docusate sodium  100 mg Oral BID  . DULoxetine  30 mg Oral BID  . ferrous sulfate  325 mg Oral TID WC  . heparin  5,000 Units Subcutaneous 3 times per day  .  ipratropium  0.5 mg Nebulization QID  . levofloxacin  750 mg Oral Daily  . loratadine  10 mg Oral Daily  . methylPREDNISolone (SOLU-MEDROL) injection  60 mg Intravenous Q24H  . metoCLOPramide  10 mg Oral BID  . mometasone-formoterol  2 puff Inhalation BID  . oxyCODONE  15 mg Oral QID  . [START ON 11/12/2013] predniSONE  40 mg Oral Q breakfast  . zolpidem  5 mg Oral QHS    Continuous Infusions:   Past Medical History  Diagnosis Date  . Depression   . Suicidal ideation 2007     attempted overdose 2012  Dr. Tivis Ringer report  . Bipolar disorder   . Substance abuse      narcotics, alcohol, tobacco  . Chronic pain syndrome     follows at pain managment  . DEFICIENCY, VITAMIN D NOS 01/03/2007  . Chronic lower back pain   . Anxiety   . OSTEOPOROSIS 06/17/2009    DEXA 05/2009 : L femur -2.9; R femur -2.5. Alendronate on med list but not taking. Needs addressed ASAP as h/o fractures.    . Elevated liver function tests   . On home oxygen therapy     "2L; 24/7" (02/27/2013)  . Shortness of breath     "all the time" (02/27/2013)  . COPD (chronic obstructive pulmonary disease)     oxygen dependent  . Sciatic pain   . Arthritis     "legs, back; hips, primarily left hip" (02/27/2013)  . Anemia     Past Surgical History  Procedure Laterality Date  . Orif ankle fracture Left 11/16/2010  . Cesarean section  1974; 1979  . Tubal ligation  1979  . Augmentation mammaplasty  ~ 2007    Kallie Locks, MS, Provisional LDN Pager # [phone] After hours/ weekend pager # [phone]

## 2013-11-11 NOTE — Discharge Instructions (Signed)
Thank you for trusting [place] with your medical care!  You were hospitalized for acute-on-chronic respiratory failure related to pneumonia & COPD and treated with antibiotics, steroids, and your home medications.   For the antibiotic Levaquin, please take one tablet daily for six days.  For the steroid prednisone, please take one tablet daily for five days.   For the anemia, please take the iron tablet three times a day with meals and Colace twice a day to avoid constipation related to them.   For the Ambien, please take 1/2 tablet instead of 1 tablet given the symptoms you had last night.   If you feel things are getting worse, please call our clinic [phone].

## 2013-11-11 NOTE — Progress Notes (Signed)
Physical Therapy Treatment Patient Details Name: [name] MRN: [id number] DOB: [birth year] Today's Date: 11/11/2013    History of Present Illness      PT Comments    Pt with flat affect and increased confusion as compared to eval.  This is suspected to be due to [place] and pain meds.  Excellent improvement in gait distance and activity tolerance.  PT to continue per POC.  Follow Up Recommendations  Home health PT;Supervision - Intermittent     Equipment Recommendations  None recommended by PT    Recommendations for Other Services       Precautions / Restrictions Precautions Precautions: Fall    Mobility  Bed Mobility         Supine to sit: Supervision        Transfers   Equipment used: Rolling walker (2 wheeled)   Sit to Stand: Supervision Stand pivot transfers: Supervision       General transfer comment: supervision for safety  Ambulation/Gait Ambulation/Gait assistance: Min guard Ambulation Distance (Feet): 165 Feet Assistive device: Rolling walker (2 wheeled) Gait Pattern/deviations: Decreased stride length;Step-through pattern;Drifts right/left Gait velocity: decreased   General Gait Details: verbal cues for RW management and to stay on task   Stairs            Wheelchair Mobility    Modified Rankin (Stroke Patients Only)       Balance                                    Cognition Arousal/Alertness: Lethargic;Suspect due to medications Behavior During Therapy: Flat affect Overall Cognitive Status: Impaired/Different from baseline Area of Impairment: Safety/judgement;Problem solving;Memory     Memory: Decreased recall of precautions;Decreased short-term memory   Safety/Judgement: Decreased awareness of safety   Problem Solving: Slow processing;Difficulty sequencing;Requires verbal cues      Exercises      General Comments        Pertinent Vitals/Pain Pain Assessment: 0-10 Pain Score: 10-Worst pain  ever Pain Location: back (assymptomatic) Pain Intervention(s): Monitored during session;Premedicated before session    Home Living                      Prior Function            PT Goals (current goals can now be found in the care plan section) Progress towards PT goals: Progressing toward goals    Frequency  Min 3X/week    PT Plan Current plan remains appropriate    Co-evaluation             End of Session Equipment Utilized During Treatment: Gait belt Activity Tolerance: Patient tolerated treatment well Patient left: in chair;with call bell/phone within reach     Time: 3875-6433 PT Time Calculation (min): 14 min  Charges:  $Gait Training: 8-22 mins                    G Codes:      Lorriane Shire 11/11/2013, 10:00 AM

## 2013-11-11 NOTE — Progress Notes (Signed)
Subjective: Overnight, RN reported she was disoriented and had more dyspnea on exertion and suspects Ambien. This morning, she feels ready to go home.   Objective: Vital signs in last 24 hours: Filed Vitals:   11/10/13 1958 11/10/13 2019 11/11/13 0515 11/11/13 0900  BP:  138/64 134/63   Pulse:  78 96   Temp:  97.9 F (36.6 C) 100.1 F (37.8 C)   TempSrc:  Oral Oral   Resp:  18 18   Height:      Weight:   86 lb 11.2 oz (39.327 kg)   SpO2: 94% 97% 93% 96%   Weight change: -4 lb 9.1 oz (-2.073 kg)  Intake/Output Summary (Last 24 hours) at 11/11/13 1129 Last data filed at 11/11/13 6761  Gross per 24 hour  Intake   1060 ml  Output      0 ml  Net   1060 ml   Vitals revieiwed General: sitting in bed in NAD, 4L O2 by [place] HEENT:  no scleral icterus  Cardiac: RRR, no rubs, murmurs or gallops  Pulm: clear to auscultation bilaterally with moderate air flow post-treatment Abd: soft, nontender, nondistended, BS present  Ext: warm and well perfused, no pedal edema  Neuro: responds to questions appropriately; moving all extremities voluntairly  Lab Results: Basic Metabolic Panel:  Recent Labs Lab 11/09/13 0650 11/10/13 0501  NA 140 138  K 4.5 4.3  CL 97 96  CO2 33* 34*  GLUCOSE 117* 147*  BUN 8 9  CREATININE 0.33* 0.32*  CALCIUM 8.0* 8.2*   CBC:  Recent Labs Lab 11/10/13 0501 11/11/13 0655  WBC 12.5* 11.3*  HGB 7.8* 8.0*  HCT 24.7* 25.4*  MCV 97.2 94.4  PLT 569* 629*   Anemia Panel:  Recent Labs Lab 11/09/13 0850  VITAMINB12 961*  FOLATE 13.7  FERRITIN 10  TIBC 360  IRON 14*  RETICCTPCT 2.6    Micro Results: Recent Results (from the past 240 hour(s))  CULTURE, BLOOD (SINGLE)     Status: None   Collection Time    11/08/13  4:00 PM      Result Value Ref Range Status   Specimen Description BLOOD RIGHT ARM   Final   Special Requests BOTTLES DRAWN AEROBIC AND ANAEROBIC 10CC EACH   Final   Culture  Setup Time     Final   Value: 11/08/2013 19:17   Performed at [facility]   Culture     Final   Value:        BLOOD CULTURE RECEIVED NO GROWTH TO DATE CULTURE WILL BE HELD FOR 5 DAYS BEFORE ISSUING A FINAL NEGATIVE REPORT     Performed at [facility]   Report Status PENDING   Incomplete   Studies/Results: No results found. Medications: I have reviewed the patient's current medications. Scheduled Meds: . albuterol  2.5 mg Nebulization QID  . docusate sodium  100 mg Oral BID  . DULoxetine  30 mg Oral BID  . ferrous sulfate  325 mg Oral TID WC  . heparin  5,000 Units Subcutaneous 3 times per day  . ipratropium  0.5 mg Nebulization QID  . levofloxacin  750 mg Oral Daily  . loratadine  10 mg Oral Daily  . methylPREDNISolone (SOLU-MEDROL) injection  60 mg Intravenous Q24H  . metoCLOPramide  10 mg Oral BID  . mometasone-formoterol  2 puff Inhalation BID  . oxyCODONE  15 mg Oral QID  . [START ON 11/12/2013] predniSONE  40 mg Oral Q breakfast  .  zolpidem  5 mg Oral QHS   Continuous Infusions:  PRN Meds:.albuterol, guaiFENesin-dextromethorphan, ondansetron (ZOFRAN) IV, ondansetron Assessment/Plan: Ms. Kristensen is a 63 year old female with COPD (GOLD Stage IV on 3L O2), tobacco abuse, osteoporosis, GERD, chronic pain, depression, bipolar disorder hospitalized for acute-on-chronic respiratory failure 2/2 CAP in the setting of COPD exacerbation found to have anemia.   #Acute-on-chronic respiratory failure: She reports that her breathing is improving. She is still on 4L O2. Blood culture from 8/28 with NGTD. -Abx 4/5: Continue Levaquin 750mg  PO -Continue O2, home meds, & pulse oximetry  -Wean off O2 as tolerated, will need O2 with ambulation prior to discharge -Monitor on telemetry   #Iron deficiency anemia: Hb 8 today, stable from 7.8 yesterday. Anemia panel 8/29 showed low iron level of 14, ferritin of 10, normal folate, mildly elevated B12.  -Blood smear pending -Continue Fe 375mg  and Colace 100mg  BID for constipation  prophylaxis -Nutrition consult   #Severe protein-calorie malnutrition: Likely due to her advanced COPD. Her BMI is 17. She is cachetic with iron deficiency anemia.   #Tobacco abuse: Continue advising against her tobacco use.  #Bipolar Disorder: Continue Cymbalta 30mg .   #GERD: Continue Protonix 40mg  & Reglan 10mg  BID.   #Chronic pain: Continue oxycodone 15mg  QID.   #FEN:  -Diet: Regular   #DVT prophylaxis: heparin 5000 units subcutaneous   #CODE STATUS: DNR/DNI  Dispo: Disposition is deferred at this time, awaiting improvement of current medical problems.  Anticipated discharge in approximately 1-2 day(s).   The patient does have a current PCP (Charlott Rakes, MD) and does need an [facility] hospital follow-up appointment after discharge.  The patient does have transportation limitations that hinder transportation to clinic appointments.  .Services Needed at time of discharge: Y = Yes, Blank = No PT: Home Health PT  OT:   RN:   Equipment:   Other:     LOS: 3 days   Charlott Rakes, MD 11/11/2013, 11:29 AM

## 2013-11-11 NOTE — Progress Notes (Signed)
  Date: 11/11/2013  Patient name: [name]  Medical record number: [id number]  Date of birth: [birth year]   This patient has been seen and the plan of care was discussed with the house staff. Please see their note for complete details. I concur with their findings with the following additions/corrections: Ms Ralphs was sitting in a chair and looking even better. She feels at baseline. She is on 3.4 L O2 [place] and uses 3 L at home. PT eval pt and rec home PT - she already has an aide. D/C today to home to complete ABX and steroid taper.   Bartholomew Crews, MD 11/11/2013, 3:12 PM

## 2013-11-11 NOTE — Progress Notes (Signed)
O2 tank from [facility] at bedside for Pt to use for transport home. Notified Pat, Pts home aide of pts discharge and she is a the bedside to transport Pt home. I also notified Pts sister Stanton Kidney of Pts discharge and asked Dr Posey Pronto to speak with her concerning her possible safety concerns due to her being discharged home alone. D/C instructions reviewed with the Pt and she verbalized understanding of how to admin her new abx and prednisone at home. Pt then discharged at this time. Notified Dr Posey Pronto.

## 2013-11-12 DIAGNOSIS — D509 Iron deficiency anemia, unspecified: Secondary | ICD-10-CM | POA: Diagnosis present

## 2013-11-12 LAB — PATHOLOGIST SMEAR REVIEW

## 2013-11-13 ENCOUNTER — Emergency Department (HOSPITAL_COMMUNITY): Payer: Medicare Other

## 2013-11-13 ENCOUNTER — Emergency Department (HOSPITAL_COMMUNITY)
Admission: EM | Admit: 2013-11-13 | Discharge: 2013-11-13 | Disposition: A | Discharge status: Home / Self Care | Payer: Medicare Other | Attending: Emergency Medicine | Admitting: Emergency Medicine

## 2013-11-13 ENCOUNTER — Telehealth: Payer: Self-pay | Admitting: *Deleted

## 2013-11-13 ENCOUNTER — Encounter (HOSPITAL_COMMUNITY): Payer: Self-pay | Admitting: Emergency Medicine

## 2013-11-13 DIAGNOSIS — J9620 Acute and chronic respiratory failure, unspecified whether with hypoxia or hypercapnia: Secondary | ICD-10-CM

## 2013-11-13 DIAGNOSIS — R5381 Other malaise: Secondary | ICD-10-CM | POA: Insufficient documentation

## 2013-11-13 DIAGNOSIS — F319 Bipolar disorder, unspecified: Secondary | ICD-10-CM | POA: Diagnosis not present

## 2013-11-13 DIAGNOSIS — D649 Anemia, unspecified: Secondary | ICD-10-CM | POA: Insufficient documentation

## 2013-11-13 DIAGNOSIS — F29 Unspecified psychosis not due to a substance or known physiological condition: Secondary | ICD-10-CM | POA: Insufficient documentation

## 2013-11-13 DIAGNOSIS — J441 Chronic obstructive pulmonary disease with (acute) exacerbation: Secondary | ICD-10-CM | POA: Insufficient documentation

## 2013-11-13 DIAGNOSIS — Z792 Long term (current) use of antibiotics: Secondary | ICD-10-CM | POA: Insufficient documentation

## 2013-11-13 DIAGNOSIS — M129 Arthropathy, unspecified: Secondary | ICD-10-CM | POA: Insufficient documentation

## 2013-11-13 DIAGNOSIS — IMO0002 Reserved for concepts with insufficient information to code with codable children: Secondary | ICD-10-CM | POA: Diagnosis not present

## 2013-11-13 DIAGNOSIS — G8929 Other chronic pain: Secondary | ICD-10-CM | POA: Insufficient documentation

## 2013-11-13 DIAGNOSIS — F411 Generalized anxiety disorder: Secondary | ICD-10-CM | POA: Insufficient documentation

## 2013-11-13 DIAGNOSIS — R45851 Suicidal ideations: Secondary | ICD-10-CM | POA: Diagnosis not present

## 2013-11-13 DIAGNOSIS — Z79899 Other long term (current) drug therapy: Secondary | ICD-10-CM | POA: Insufficient documentation

## 2013-11-13 DIAGNOSIS — R531 Weakness: Secondary | ICD-10-CM

## 2013-11-13 DIAGNOSIS — R5383 Other fatigue: Principal | ICD-10-CM

## 2013-11-13 DIAGNOSIS — R410 Disorientation, unspecified: Secondary | ICD-10-CM

## 2013-11-13 DIAGNOSIS — F17200 Nicotine dependence, unspecified, uncomplicated: Secondary | ICD-10-CM | POA: Insufficient documentation

## 2013-11-13 LAB — BASIC METABOLIC PANEL
Anion gap: 13 (ref 5–15)
BUN: 7 mg/dL (ref 6–23)
CALCIUM: 8.3 mg/dL — AB (ref 8.4–10.5)
CO2: 33 mEq/L — ABNORMAL HIGH (ref 19–32)
Chloride: 94 mEq/L — ABNORMAL LOW (ref 96–112)
Creatinine, Ser: 0.33 mg/dL — ABNORMAL LOW (ref 0.50–1.10)
GFR calc Af Amer: >90 mL/min (ref >90)
GFR calc non Af Amer: >90 mL/min (ref >90)
GLUCOSE: 118 mg/dL — AB (ref 70–99)
POTASSIUM: 3.8 meq/L (ref 3.7–5.3)
Sodium: 140 mEq/L (ref 137–147)

## 2013-11-13 LAB — URINALYSIS, ROUTINE W REFLEX MICROSCOPIC
BILIRUBIN URINE: NEGATIVE
Glucose, UA: NEGATIVE mg/dL
Hgb urine dipstick: NEGATIVE
Ketones, ur: NEGATIVE mg/dL
Leukocytes, UA: NEGATIVE
Nitrite: NEGATIVE
Protein, ur: 30 mg/dL — AB
Specific Gravity, Urine: 1.016 (ref 1.005–1.030)
UROBILINOGEN UA: 1 mg/dL (ref 0.0–1.0)
pH: 7.5 (ref 5.0–8.0)

## 2013-11-13 LAB — URINE MICROSCOPIC-ADD ON

## 2013-11-13 LAB — I-STAT ARTERIAL BLOOD GAS, ED
ACID-BASE EXCESS: 14 mmol/L — AB (ref 0.0–2.0)
BICARBONATE: 38.6 meq/L — AB (ref 20.0–24.0)
O2 Saturation: 97 %
TCO2: 40 mmol/L (ref 0–100)
pCO2 arterial: 48.1 mmHg — ABNORMAL HIGH (ref 35.0–45.0)
pH, Arterial: 7.512 — ABNORMAL HIGH (ref 7.350–7.450)
pO2, Arterial: 85 mmHg (ref 80.0–100.0)

## 2013-11-13 LAB — CBC WITH DIFFERENTIAL/PLATELET
Basophils Absolute: 0 10*3/uL (ref 0.0–0.1)
Basophils Relative: 0 % (ref 0–1)
EOS ABS: 0 10*3/uL (ref 0.0–0.7)
EOS PCT: 0 % (ref 0–5)
HCT: 32.5 % — ABNORMAL LOW (ref 36.0–46.0)
HEMOGLOBIN: 10.1 g/dL — AB (ref 12.0–15.0)
Lymphocytes Relative: 10 % — ABNORMAL LOW (ref 12–46)
Lymphs Abs: 1.2 10*3/uL (ref 0.7–4.0)
MCH: 30.1 pg (ref 26.0–34.0)
MCHC: 31.1 g/dL (ref 30.0–36.0)
MCV: 97 fL (ref 78.0–100.0)
MONOS PCT: 7 % (ref 3–12)
Monocytes Absolute: 0.8 10*3/uL (ref 0.1–1.0)
Neutro Abs: 9.7 10*3/uL — ABNORMAL HIGH (ref 1.7–7.7)
Neutrophils Relative %: 83 % — ABNORMAL HIGH (ref 43–77)
Platelets: 660 10*3/uL — ABNORMAL HIGH (ref 150–400)
RBC: 3.35 MIL/uL — AB (ref 3.87–5.11)
RDW: 17.4 % — ABNORMAL HIGH (ref 11.5–15.5)
WBC: 11.8 10*3/uL — ABNORMAL HIGH (ref 4.0–10.5)

## 2013-11-13 LAB — I-STAT TROPONIN, ED: Troponin i, poc: 0 ng/mL (ref 0.00–0.08)

## 2013-11-13 LAB — INFLUENZA PANEL BY PCR (TYPE A & B)
H1N1 flu by pcr: NOT DETECTED
INFLAPCR: NEGATIVE
INFLBPCR: NEGATIVE

## 2013-11-13 MED ORDER — IPRATROPIUM-ALBUTEROL 0.5-2.5 (3) MG/3ML IN SOLN
3.0000 mL | Freq: Once | RESPIRATORY_TRACT | Status: AC
  Administered 2013-11-13: 3 mL via RESPIRATORY_TRACT
  Filled 2013-11-13: qty 3

## 2013-11-13 MED ORDER — OXYCODONE HCL 5 MG PO TABS
5.0000 mg | ORAL_TABLET | Freq: Once | ORAL | Status: AC
  Administered 2013-11-13: 5 mg via ORAL
  Filled 2013-11-13: qty 1

## 2013-11-13 MED ORDER — DOXYCYCLINE HYCLATE 100 MG PO CAPS: 100.0000 mg | ORAL_CAPSULE | Freq: Two times a day (BID) | ORAL | Status: DC

## 2013-11-13 NOTE — Progress Notes (Signed)
ED CM consulted by C. Forrucci PA-C concerning patient disposition. Patient presented to ED with c/o weakness. Patient recently discharged from [facility] hospital,  CM attempted to contact patient to set up home health, unsuccessful at attempt. Spoke with patient at bedside concerning recommendations for home health patient agreeable, offered choice [facility]. Discussed that someone from [facility] would be contacting patient 24-48 hours by phone, patient t tells me that  her phone was disconnected and she will not have a phone until tomorrow, CM explained that it is important that she has a phone for safety in case of an emergency, since patient lives alone. Discussed with AKenton Kingfisher PA-C and. ED CSW. Both agreed not a safe discharge. ED CSW spoke with patient about this not being a safe discharge patient was adament about not staying, stated that she has a neighbor that checks on her and a HHA that comes in 7 days a week for 2 hours. She stated, that she will have a phone tomorrow and will call in with telephone number to set up [facility] services. ED CM will follow up with patient tomorrow.

## 2013-11-13 NOTE — ED Provider Notes (Signed)
Medical screening examination/treatment/procedure(s) were conducted as a shared visit with non-physician practitioner(s) and myself.  I personally evaluated the patient during the encounter.   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 ([id number]) on 11/13/2013 10:47:08 AM      Pt presents w/ report of intermittent episodes of confusion for 2 years.  On PE, she is able to given very detailed account of her morning, and does not appear confused. She has slight wheezing throughout all lung fields. Her physical exam is otherwise unremarkable. She has no focal weakness. Workup is essentially unremarkable with a normal chest x-ray, CT head UA was unremarkable blood work. I spoke with internal medicine teaching service about arranging close outpatient followup. It appears that her of ED visits today is to be placed in a nursing facility. Social work in case [place] and consult. If they have been able to posterior tonight she will be discharged home and her PCP can work on placement.  Ernestina Patches, MD 11/13/13 [phone]

## 2013-11-13 NOTE — ED Notes (Addendum)
Pt arrives via EMS from home with increased weakness and confusion. Pt states she's been unable to care for herself recently, forgets to eat meals, pt states, "my thought process is all messed up." Pt states headache began Monday. C/o chronic left lower sciatic nerve pain. Recently discharged from hospital with pneumonia. Denies recent fevers. Reports frequency of urination. Received 5mg  albuterol/0.5mg  atrovent in transport for wheezing. None noted at triage. Coarse breath sounds noted. Patient placed on 2L [place]. States this is what she normally wears.Currently awake, alert, oriented x4, NAD.

## 2013-11-13 NOTE — ED Notes (Addendum)
RN notified home health agency that pt will need her aide to take her home. Pt aide will not be available. Pt to be sent home via [facility].

## 2013-11-13 NOTE — ED Notes (Signed)
Pt requesting breathing treatment at this time.

## 2013-11-13 NOTE — Discharge Instructions (Signed)
Chronic Obstructive Pulmonary Disease Chronic obstructive pulmonary disease (COPD) is a common lung problem. In COPD, the flow of air from the lungs is limited. The way your lungs work will probably never return to normal, but there are things you can do to improve your lungs and make yourself feel better. HOME CARE  Take all medicines as told by your doctor.  Avoid medicines or cough syrups that dry up your airway (such as antihistamines) and do not allow you to get rid of thick spit. You do not need to avoid them if told differently by your doctor.  If you smoke, stop. Smoking makes the problem worse.  Avoid being around things that make your breathing worse (like smoke, chemicals, and fumes).  Use oxygen therapy and therapy to help improve your lungs (pulmonary rehabilitation) if told by your doctor. If you need home oxygen therapy, ask your doctor if you should buy a tool to measure your oxygen level (oximeter).  Avoid people who have a sickness you can catch (contagious).  Avoid going outside when it is very hot, cold, or humid.  Eat healthy foods. Eat smaller meals more often. Rest before meals.  Stay active, but remember to also rest.  Make sure to get all the shots (vaccines) your doctor recommends. Ask your doctor if you need a pneumonia shot.  Learn and use tips on how to relax.  Learn and use tips on how to control your breathing as told by your doctor. Try:  Breathing in (inhaling) through your nose for 1 second. Then, pucker your lips and breath out (exhale) through your lips for 2 seconds.  Putting one hand on your belly (abdomen). Breathe in slowly through your nose for 1 second. Your hand on your belly should move out. Pucker your lips and breathe out slowly through your lips. Your hand on your belly should move in as you breathe out.  Learn and use controlled coughing to clear thick spit from your lungs. The steps are: 1. Lean your head a little forward. 2. Breathe  in deeply. 3. Try to hold your breath for 3 seconds. 4. Keep your mouth slightly open while coughing 2 times. 5. Spit any thick spit out into a tissue. 6. Rest and do the steps again 1 or 2 times as needed. GET HELP IF:  You cough up more thick spit than usual.  There is a change in the color or thickness of the spit.  It is harder to breathe than usual.  Your breathing is faster than usual. GET HELP RIGHT AWAY IF:   You have shortness of breath while resting.  You have shortness of breath that stops you from:  Being able to talk.  Doing normal activities.  You chest hurts for longer than 5 minutes.  Your skin color is more blue than usual.  Your pulse oximeter shows that you have low oxygen for longer than 5 minutes. MAKE SURE YOU:   Understand these instructions.  Will watch your condition.  Will get help right away if you are not doing well or get worse. Document Released: 08/17/2007 Document Revised: 07/15/2013 Document Reviewed: 10/25/2012 ExitCare Patient Information 2015 [employer]. This information is not intended to replace advice given to you by your health care provider. Make sure you discuss any questions you have with your health care provider.  

## 2013-11-13 NOTE — ED Notes (Signed)
Pt to be sent home via [facility]. [facility] has been called.

## 2013-11-13 NOTE — Telephone Encounter (Signed)
Thank you.  CSW attempted to contact pt at home phone number, unable to leave a voice mail.  CSW placed call to pt's sister,  [name].  Sister voiced concern stating pt is on her way to the ED because she could not be left alone.  Sister wanting pt to be d/c to an assisted living.  CSW informed sister, this worker started required form for SNF placement.  Pt may benefit from [facility] SNF placement.  CSW discussed payor source for Assisted Living.  Sister states pt unable to pay privately for ALF. CSW suggested pt will need to contact her DSS casework to apply for the Special Assistance Program is AL is an option.  Sister in agreement for SNF at this time and will discuss options when pt is ready for d/c from SNF.  ED CSW notified. PAS initiated.

## 2013-11-13 NOTE — Care Management Note (Signed)
CARE MANAGEMENT NOTE 11/13/2013  Patient:  [name], [name]   Account Number:  [id number]  Date Initiated:  11/12/2013  Documentation initiated by:  Ricki Miller  Subjective/Objective Assessment:   63 yr old female was admitted for pneumonia.     Action/Plan:   Patient was discharged prior to [facility] arranging home health PT. CM has made multiple phone call attempts to arrange, without success.   Anticipated DC Date:     Anticipated DC Plan:           Choice offered to / List presented to:             Status of service:  In process, will continue to follow Medicare Important Message given?   (If response is "NO", the following Medicare IM given date fields will be blank) Date Medicare IM given:   Medicare IM given by:   Date Additional Medicare IM given:   Additional Medicare IM given by:    Discharge Disposition:    Per UR Regulation:  Reviewed for med. necessity/level of care/duration of stay  If discussed at [place] of Stay Meetings, dates discussed:    Comments:

## 2013-11-13 NOTE — Telephone Encounter (Signed)
Pt's sister calls this am very worried for pt's safety: 1) when aide arrived this am pt could not get to door and let aide in, maintenance man had to unlock door 2) pt needed great assistance getting to bathroom then was unable to orient herself to what she needed to do in bathroom 3) pt unable to know when or what medication to take Ms Redmond Pulling states pt realizes at this time she needs to be in a place that is safe and she has direction in not alone. Ms Redmond Pulling is ask to have aide bring pt or call 911 for transport to ED. ShanaG. csw for imc is made aware of the situation and will assist csw for ED in placement. Pt is sent to ED for safety issues: mobility, medication, cognitive decline and nourishment.

## 2013-11-13 NOTE — ED Notes (Signed)
Social Work at bedside 

## 2013-11-13 NOTE — ED Provider Notes (Signed)
CSN: [id number]     Arrival date & time 11/13/13  1031 History   First MD Initiated Contact with Patient 11/13/13 1048     Chief Complaint  Patient presents with  . Weakness   Patient is a 63 y.o. female presenting with weakness.  Weakness Associated symptoms include weakness.    Patient is a 63 y.o. Female who presents to the ED from home for weakness and confusion.  Per the patient the patients home health aid found her in the bathroom with no idea what she was doing or where she was.  Patient states that she has also had worsening shortness of breath and coughing.  Patient states that her cough is now productive and is a deep brown colored sputum.  Patient states that she wears 3L of O2 at home for COPD.  She has had spells of confusion and weakness in the past.  Patient states that she has never had a workup for dementia in the past.  Patient states that she is in the process of attempting to move to assisted living, but currently lives at home alone and has a daily home health aid.  Patient denies fever, chills, nausea, vomiting, chest pain, abdominal pain, urinary troubles, diarrhea, constipation, melena, or hematochezia.  All other ROS are negative.    Past Medical History  Diagnosis Date  . Depression   . Suicidal ideation 2007     attempted overdose 2012  Dr. Tivis Ringer report  . Bipolar disorder   . Substance abuse      narcotics, alcohol, tobacco  . Chronic pain syndrome     follows at pain managment  . DEFICIENCY, VITAMIN D NOS 01/03/2007  . Chronic lower back pain   . Anxiety   . OSTEOPOROSIS 06/17/2009    DEXA 05/2009 : L femur -2.9; R femur -2.5. Alendronate on med list but not taking. Needs addressed ASAP as h/o fractures.    . Elevated liver function tests   . On home oxygen therapy     "2L; 24/7" (02/27/2013)  . Shortness of breath     "all the time" (02/27/2013)  . COPD (chronic obstructive pulmonary disease)     oxygen dependent  . Sciatic pain   . Arthritis      "legs, back; hips, primarily left hip" (02/27/2013)  . Anemia    Past Surgical History  Procedure Laterality Date  . Orif ankle fracture Left 11/16/2010  . Cesarean section  1974; 1979  . Tubal ligation  1979  . Augmentation mammaplasty  ~ 2007   Family History  Problem Relation Age of Onset  . Stroke Neg Hx   . Cancer Neg Hx   . Myelodysplastic syndrome Father     Died from myelofibrosis though diagnosed post-mortem   History  Substance Use Topics  . Smoking status: Current Every Day Smoker -- 0.20 packs/day for 44 years    Types: Cigarettes  . Smokeless tobacco: Never Used     Comment: 02/27/2013 "smoking 2 cigarettes/day; that's down from 2 ppd"  . Alcohol Use: No     Comment: 02/27/2013 "nothing in 6 years; never had a problem w/it"   OB History   Grav Para Term Preterm Abortions TAB SAB Ect Mult Living                 Review of Systems  Neurological: Positive for weakness.    See HPI  Allergies  Banana and Pollen extract  Home Medications   Prior to Admission medications  Medication Sig Start Date End Date Taking? Authorizing Provider  albuterol (PROAIR HFA) 108 (90 BASE) MCG/ACT inhaler Inhale 1 puff into the lungs every 2 (two) hours as needed for wheezing or shortness of breath.   Yes Historical Provider, MD  albuterol (PROVENTIL) (2.5 MG/3ML) 0.083% nebulizer solution Take 3 mLs (2.5 mg total) by nebulization every 4 (four) hours as needed. Shortness of breath 02/28/13  Yes Lesly Dukes, MD  AMBULATORY NON FORMULARY MEDICATION Continuous O2 @@ 3LPM   Yes Historical Provider, MD  calcium carbonate (OS-CAL) 600 MG TABS tablet Take 600 mg by mouth 2 (two) times daily with a meal.   Yes Historical Provider, MD  cholecalciferol (VITAMIN D) 1000 UNITS tablet Take 1,000 Units by mouth daily. 06/09/11  Yes Ansel Bong, MD  docusate sodium (COLACE) 100 MG capsule Take 100 mg by mouth 2 (two) times daily as needed for mild constipation.   Yes Historical Provider, MD   DULoxetine (CYMBALTA) 30 MG capsule Take 30 mg by mouth 2 (two) times daily.    Yes Historical Provider, MD  ferrous sulfate 325 (65 FE) MG tablet Take 1 tablet (325 mg total) by mouth 3 (three) times daily with meals. 11/11/13  Yes Charlott Rakes, MD  fexofenadine (ALLEGRA) 180 MG tablet Take 180 mg by mouth daily.   Yes Historical Provider, MD  Fluticasone-Salmeterol (ADVAIR DISKUS) 250-50 MCG/DOSE AEPB Inhale 1 puff into the lungs 2 (two) times daily. 10/07/13  Yes Sid Falcon, MD  levofloxacin (LEVAQUIN) 750 MG tablet Take 1 tablet (750 mg total) by mouth daily. 11/11/13  Yes Charlott Rakes, MD  metoCLOPramide (REGLAN) 10 MG tablet Take 1 tablet (10 mg total) by mouth at bedtime. 01/01/13  Yes Sable Feil, MD  ondansetron (ZOFRAN) 4 MG tablet Take 1 tablet (4 mg total) by mouth every 8 (eight) hours as needed for nausea. 03/25/13  Yes Rebecca Eaton, MD  OVER THE COUNTER MEDICATION Take 1 tablet by mouth daily. Bone health vitamin   Yes Historical Provider, MD  oxyCODONE (ROXICODONE) 15 MG immediate release tablet Take 1 tablet (15 mg total) by mouth 4 (four) times daily. 05/14/13  Yes Valaria Good, MD  predniSONE (DELTASONE) 20 MG tablet Take 2 tablets (40 mg total) by mouth daily with breakfast. 11/12/13  Yes Charlott Rakes, MD  Tapentadol HCl (NUCYNTA) 100 MG TABS Take 100 mg by mouth 4 (four) times daily.   Yes Historical Provider, MD  zolpidem (AMBIEN) 5 MG tablet Take 0.5 tablets (2.5 mg total) by mouth at bedtime. 11/11/13  Yes Rushil Posey Pronto, MD   BP 146/84  Pulse 92  Temp(Src) 98.8 F (37.1 C) (Oral)  Resp 18  SpO2 97% Physical Exam  Nursing note and vitals reviewed. Constitutional: She is oriented to person, place, and time. She appears well-developed and well-nourished. No distress.  HENT:  Head: Normocephalic and atraumatic.  Mouth/Throat: Oropharynx is clear and moist. No oropharyngeal exudate.  Eyes: Conjunctivae and EOM are normal. Pupils are equal, round, and reactive to  light. No scleral icterus.  Neck: Normal range of motion. Neck supple. No JVD present. No thyromegaly present.  Cardiovascular: Normal rate, regular rhythm, normal heart sounds and intact distal pulses.  Exam reveals no gallop and no friction rub.   No murmur heard. Pulmonary/Chest: Effort normal. No respiratory distress. She has wheezes. She has no rales. She exhibits no tenderness.  Mild end expiratory wheeze  Abdominal: Soft. Bowel sounds are normal. She exhibits no distension and no mass. There is no  tenderness. There is no rebound and no guarding.  Musculoskeletal: Normal range of motion.  Lymphadenopathy:    She has no cervical adenopathy.  Neurological: She is alert and oriented to person, place, and time. She has normal strength. No cranial nerve deficit or sensory deficit. Coordination normal.  Skin: Skin is warm and dry. She is not diaphoretic.  Psychiatric: She has a normal mood and affect. Her behavior is normal. Judgment and thought content normal.    ED Course  Procedures (including critical care time) Labs Review Labs Reviewed  CBC WITH DIFFERENTIAL - Abnormal; Notable for the following:    WBC 11.8 (*)    RBC 3.35 (*)    Hemoglobin 10.1 (*)    HCT 32.5 (*)    RDW 17.4 (*)    Platelets 660 (*)    Neutrophils Relative % 83 (*)    Neutro Abs 9.7 (*)    Lymphocytes Relative 10 (*)    All other components within normal limits  BASIC METABOLIC PANEL - Abnormal; Notable for the following:    Chloride 94 (*)    CO2 33 (*)    Glucose, Bld 118 (*)    Creatinine, Ser 0.33 (*)    Calcium 8.3 (*)    All other components within normal limits  URINALYSIS, ROUTINE W REFLEX MICROSCOPIC - Abnormal; Notable for the following:    APPearance CLOUDY (*)    Protein, ur 30 (*)    All other components within normal limits  URINE MICROSCOPIC-ADD ON - Abnormal; Notable for the following:    Bacteria, UA FEW (*)    All other components within normal limits  I-STAT ARTERIAL BLOOD GAS,  ED - Abnormal; Notable for the following:    pH, Arterial 7.512 (*)    pCO2 arterial 48.1 (*)    Bicarbonate 38.6 (*)    Acid-Base Excess 14.0 (*)    All other components within normal limits  URINE CULTURE  I-STAT TROPOININ, ED    Imaging Review Dg Chest 2 View  11/13/2013   CLINICAL DATA:  Weakness and shortness of breath  EXAM: CHEST  2 VIEW  COMPARISON:  11/08/2013  FINDINGS: No cardiomegaly. Negative upper mediastinal contours. There is interval improvement in basal lung aeration compared to prior. Severe pulmonary hyperinflation with biapical bullous emphysema. There is no edema, consolidation, effusion, or pneumothorax.  IMPRESSION: Severe emphysema.  No evidence of acute superimposed disease.   Electronically Signed   By: Jorje Guild M.D.   On: 11/13/2013 13:42   Ct Head Wo Contrast  11/13/2013   CLINICAL DATA:  Altered mental status.  Weakness.  EXAM: CT HEAD WITHOUT CONTRAST  TECHNIQUE: Contiguous axial images were obtained from the base of the skull through the vertex without intravenous contrast.  COMPARISON:  Head CT scan 11/13/2013.  FINDINGS: The brain appears normal without hemorrhage, infarct, mass lesion, mass effect on an shift or abnormal extra-axial fluid collection. There is no hydrocephalus or pneumocephalus. The calvarium is intact. Mucosal thickening is seen in the maxillary sinuses bilaterally with an air-fluid level on the right. Scattered ethmoid air cell disease is noted. Mastoid air cells are clear. The calvarium is intact.  IMPRESSION: No acute intracranial abnormality.  Bilateral maxillary sinus disease with an air-fluid level on the right suggesting acute sinusitis.   Electronically Signed   By: Inge Rise M.D.   On: 11/13/2013 13:45     EKG Interpretation   Date/Time:  Wednesday November 13 2013 10:40:11 EDT Ventricular Rate:  82 PR  Interval:  123 QRS Duration: 65 QT Interval:  373 QTC Calculation: 436 R Axis:   93 Text Interpretation:  Sinus  rhythm Probable lateral infarct, old  Anteroseptal infarct, old No significant change since last tracing  Confirmed by DOCHERTY  MD, MEGAN [phone]) on 11/13/2013 10:47:08 AM      MDM   Final diagnoses:  Weakness  Acute on chronic respiratory failure, unspecified whether with hypoxia or hypercapnia  COPD exacerbation  Confusion   Patient is a 63 y.o. Female who presents to the ED weakness and shortness of breath.  Patient is alert and oriented on physical examination here at this time.  CBC appears to be at baseline.  CMP shows increased bicarb likely secondary to respiratory failure.  UA is negative.  CXR is negative at this time.  EKG show no acute changes.  ABG shows respiratory alkalosis which was likely secondary to agitation from pain.  Patient was not hypercarbic at this time.  Head CT is negative.  Troponin is negative.  Patient likely has COPD exacerbation at this time.  Will prescribe doxycyline at this time.  Patient appears to be on prednisone at baseline.  I have consulted case management and social work for help with placement in a skilled nursing facility.  Will await their help with placement.  Patient to be signed out to [facility] at this time. Dr. Tawnya Crook has also seen and evaluated the patient.  She spoke with the patients pcp who will follow-up with the patient in the next two days.     Cherylann Parr, PA-C 11/13/13 1628

## 2013-11-13 NOTE — Care Management (Signed)
ED CM received consult from C. Forucci PA-C on Pod A concerning patient disposition. ED CM reviewed record, ED CSW notified, CM and SW will f/u.               csw

## 2013-11-13 NOTE — Progress Notes (Signed)
Spoke with pt re: role of CSW/d/c planning.  Pt prefers to go home this pm even though she has no phone to use in case of an emergency.  Pt states that she will get paid on 11/14/13 and will be able to get a phone in the morning.  Pt will alert her neighbors to her situation and ask them to check on her a couple of times.  CSW expressed concern for pt's safety, but she maintains that she will be safe at home.  Pt states that she has a [place] from [facility] who visits her for 2 hours & days a week.  RNCM also looking into possible skilled [facility] services.  Pt requesting PTAR home, RN/PA informed of plan.

## 2013-11-14 LAB — CULTURE, BLOOD (SINGLE): CULTURE: NO GROWTH

## 2013-11-14 LAB — URINE CULTURE: Special Requests: NORMAL

## 2013-11-15 ENCOUNTER — Ambulatory Visit: Payer: Medicare Other | Admitting: Internal Medicine

## 2013-11-15 ENCOUNTER — Encounter: Payer: Self-pay | Admitting: Internal Medicine

## 2013-11-16 NOTE — Care Management (Addendum)
Day time ED CM received call from patient providing a new working number [phone] CM confirmed, choice of [facility].  This ED CM attempted to contact patient at number provided at number provided, no answer unable to LVM that referral was faxed in to  [facility] to establish [facility] services. ED CM will attempt later this afternoon.

## 2013-11-21 NOTE — Telephone Encounter (Signed)
[name], I appreciate the follow-up. Did she get placed in a SNF? It appears she got sent out of the ED with [facility].

## 2013-11-22 NOTE — Telephone Encounter (Signed)
She was clear when she presented to the ED.  [name] was discharged from ED to home.

## 2013-12-10 ENCOUNTER — Telehealth: Payer: Self-pay | Admitting: *Deleted

## 2013-12-10 NOTE — Telephone Encounter (Signed)
closed

## 2013-12-11 ENCOUNTER — Telehealth: Payer: Self-pay | Admitting: *Deleted

## 2013-12-11 NOTE — Telephone Encounter (Signed)
Spoke w/ dr patel this am, he will call pt today, then we will check on her periodically till her appts

## 2013-12-27 ENCOUNTER — Telehealth: Payer: Self-pay | Admitting: Internal Medicine

## 2013-12-27 NOTE — Telephone Encounter (Signed)
I spoke to her this afternoon about her auditory hallucinations. She reports they have stayed the same since we last spoke back in late September and continues to find them scary. She is scheduled to see mental health on Monday.  Recently, she notes falling more frequently than nomral She feels she has to sleep 12 hours at a time and complains of lacking energy. Though she would like to see me, she is agreeable to see another provider so long as I fill them in on her situation beforehand.  I provided her the phone number for the clinic and asked her not to hesitate to call should things change. Her Home Health aide checks in on her daily, and she feels she is in good hands with her. She will provide her aide with our clinic number should things turn for the worse.

## 2014-01-01 ENCOUNTER — Other Ambulatory Visit: Payer: Self-pay | Admitting: Internal Medicine

## 2014-01-03 NOTE — Telephone Encounter (Signed)
I have refilled for 30-day supply and will have patient schedule an appointment for further refills.

## 2014-02-14 ENCOUNTER — Encounter: Payer: Medicare Other | Admitting: Internal Medicine

## 2014-02-17 ENCOUNTER — Other Ambulatory Visit: Payer: Self-pay | Admitting: Internal Medicine

## 2014-02-18 MED ORDER — FLUTICASONE-SALMETEROL 250-50 MCG/DOSE IN AEPB: INHALATION_SPRAY | RESPIRATORY_TRACT | Status: DC

## 2014-02-18 NOTE — Telephone Encounter (Signed)
I have refilled but will have patient schedule an appointment for further refills. She was due to see me on 12/4 but cancelled her appointment and has yet to reschedule. I will call her tomorrow to explain why I'm not refilling the full amount she is requesting.

## 2014-02-21 ENCOUNTER — Encounter: Payer: Self-pay | Admitting: Internal Medicine

## 2014-02-21 ENCOUNTER — Telehealth: Payer: Self-pay | Admitting: Internal Medicine

## 2014-02-21 NOTE — Telephone Encounter (Signed)
I spoke to [employer] with [employer] who told me that she would attempt to reach the patient.  Around 1pm, I received a phone from Ms. Retterer reporting to me that she had been unsuccessful in reaching Ms. Marcell. Additionally, she had Officer Lorne Skeens go and see Ms. Danielle Dess, and he did not feel she constituted an active threat to herself or to others. Ms. Gwenyth Bender told me that her team could not help her unless the patient allowed her and her team to come out and assess out of concern for personal safety.   I called Ms. Kovar thereafter and asked if she had received any calls from the crisis team. She reported that she only had messages from [employer]. I asked if she spoke with police, and she confirmed she did though did not disclose her symptoms to the officer. I gave her the number for [employer], and she agreed to call them. I also told her I would call her in the next 1-2 hours to ensure she was able to get through to them.  About 1-2 hours later, I called Ms. Retterer, but she had not received a call from Ms. Nantz. She offered to stay on the line if I could get through to Ms. Hipwell. I attempted to start a three-way conversation but was unsuccessful on two attempts in reaching Ms. Hylton; both times, the call went to voicemail.  I called Ms. Retterer back to learn about the details of the IVC process, and she provided me with numbers for the magistrate along with their addresses and where I could find the paperwork online.   I then sought advice from Dr. Marinda Elk and Dr. Daryll Drown both of whom recommended I speak with a psychiatrist. I called Dr. Louretta Shorten who recommended I pursue IVC given the progression of her symptoms as well as their psychotic nature. He advised me to go to Pod C in the ED for assistance with completing paperwork.  In the ED, I spoke with RN Anne Ng who contacted the Magistrate's Office on my behalf. They stated that a mere telephone encounter  could not constitute grounds for IVC and that I would need to come to the office to complete paperwork in person. She also contacted the officers in the ED to put me through to Officer [place] who confirmed that Ms. Hainline denied thoughts of wanting to hurt herself or others.   To confirm what I needed to bring with me to the magistrate's office, I called and spoke to [employer]. He reported that in the 18 years he had been working, a telephone encounter was not sufficient grounds to complete IVC paperwork, specifically the examination form.   In summary, I am concerned for Ms. Cataldo's safety given the psychiatric's assessment and have exhausted all courses of action. I will attempt to have her come to the office on Monday to be seen by me as she feels comfortable sharing these thoughts with me.

## 2014-02-21 NOTE — Telephone Encounter (Addendum)
I called [name] about why she missed her appointment with me last week, and she reports feeling as though "she is living on the edge." It appears her auditory hallucinations have worsened. Previously ,they instructed her to avoid contact with medical providers for fear of being locked away indefinitely, they are not indicating to her to harm herself. She reports that she would not act on them because of her [place] as "suicide is a sin."  She was seen at [facility] last month but did not report them to the provider there and has not reported these developments to the [facility] aide who comes daily. Despite the voices, she does feel she needs help.   Given the urgency of the situation, I will consult with Triage RN to see what options we have for her.

## 2014-02-24 ENCOUNTER — Telehealth: Payer: Self-pay | Admitting: Internal Medicine

## 2014-02-24 NOTE — Telephone Encounter (Signed)
I spoke to [name] this afternoon about the possibility of being seen by me, but she reports she feels fine. She reports feeling spooked by the police on Friday, so i explained why they came again.   Though these voices are telling her to harm herself, she again reassures me that as a [place] woman, she would not act on it. She denies having a plan or any firearms at home.   I asked if she had an idea of what things would look like when she would consider seeking help, and she did not have an idea. I suggested that when the voices start increasing in frequency or advise harming others. As she enjoys watching the TV show, "Criminal Minds," I also advised that it's a bad sign when those voices start taking inspiration from it.  Outside of her psychiatric illness, I explained it was a good idea to come to the clinic to make sure we have all the medicines she is on at the right doses. She replied that she knows she is to come in every 3 months but can't bring herself to be seen based off the voices she hears.   It does not appear that she wants to be seen by me urgently, but as she is due to see someone at [facility] in January, I will attempt to contact them beforehand and ask them to check in on these symptoms and reiterate to her the importance of being seen by her PCP.

## 2014-02-25 NOTE — Telephone Encounter (Signed)
Information received regarding [facility]:   [phone] or [phone] and these numbers will get you to an operator.

## 2014-02-25 NOTE — Telephone Encounter (Signed)
Dr. Posey Pronto, I do not see a ROI on the chart.  Next time [name] is in the office, please have her sign a ROI.  If we are unable to speak with her psychiatrist we can faxed the chart note.

## 2014-02-25 NOTE — Telephone Encounter (Signed)
Let me check with the hospital social worker, she handles psychiatric.

## 2014-03-05 ENCOUNTER — Encounter: Payer: Self-pay | Admitting: Internal Medicine

## 2014-03-18 NOTE — Telephone Encounter (Signed)
I left a VM at the RN line on the first number and will look for a call back.

## 2014-03-19 ENCOUNTER — Ambulatory Visit (INDEPENDENT_AMBULATORY_CARE_PROVIDER_SITE_OTHER): Payer: Medicare Other | Admitting: Internal Medicine

## 2014-03-19 ENCOUNTER — Encounter: Payer: Self-pay | Admitting: Internal Medicine

## 2014-03-19 ENCOUNTER — Ambulatory Visit (HOSPITAL_COMMUNITY)
Admission: RE | Admit: 2014-03-19 | Discharge: 2014-03-19 | Disposition: A | Discharge status: Home / Self Care | Payer: Medicare Other | Source: Ambulatory Visit | Attending: Internal Medicine | Admitting: Internal Medicine

## 2014-03-19 VITALS — BP 133/61 | HR 77 | Temp 97.8°F

## 2014-03-19 DIAGNOSIS — D509 Iron deficiency anemia, unspecified: Secondary | ICD-10-CM | POA: Diagnosis not present

## 2014-03-19 DIAGNOSIS — F419 Anxiety disorder, unspecified: Secondary | ICD-10-CM | POA: Diagnosis not present

## 2014-03-19 DIAGNOSIS — Z0000 Encounter for general adult medical examination without abnormal findings: Secondary | ICD-10-CM | POA: Diagnosis not present

## 2014-03-19 DIAGNOSIS — Z66 Do not resuscitate: Secondary | ICD-10-CM | POA: Diagnosis not present

## 2014-03-19 DIAGNOSIS — G894 Chronic pain syndrome: Secondary | ICD-10-CM | POA: Diagnosis not present

## 2014-03-19 DIAGNOSIS — Z9981 Dependence on supplemental oxygen: Secondary | ICD-10-CM | POA: Diagnosis not present

## 2014-03-19 DIAGNOSIS — E43 Unspecified severe protein-calorie malnutrition: Secondary | ICD-10-CM | POA: Diagnosis not present

## 2014-03-19 DIAGNOSIS — J449 Chronic obstructive pulmonary disease, unspecified: Secondary | ICD-10-CM | POA: Diagnosis not present

## 2014-03-19 DIAGNOSIS — J439 Emphysema, unspecified: Secondary | ICD-10-CM | POA: Insufficient documentation

## 2014-03-19 DIAGNOSIS — F319 Bipolar disorder, unspecified: Secondary | ICD-10-CM | POA: Diagnosis not present

## 2014-03-19 DIAGNOSIS — M1990 Unspecified osteoarthritis, unspecified site: Secondary | ICD-10-CM | POA: Diagnosis not present

## 2014-03-19 DIAGNOSIS — M810 Age-related osteoporosis without current pathological fracture: Secondary | ICD-10-CM | POA: Diagnosis not present

## 2014-03-19 DIAGNOSIS — Z681 Body mass index (BMI) 19 or less, adult: Secondary | ICD-10-CM | POA: Diagnosis not present

## 2014-03-19 DIAGNOSIS — F17210 Nicotine dependence, cigarettes, uncomplicated: Secondary | ICD-10-CM | POA: Diagnosis not present

## 2014-03-19 DIAGNOSIS — E0590 Thyrotoxicosis, unspecified without thyrotoxic crisis or storm: Secondary | ICD-10-CM | POA: Diagnosis not present

## 2014-03-19 MED ORDER — FLUTICASONE-SALMETEROL 250-50 MCG/DOSE IN AEPB: INHALATION_SPRAY | RESPIRATORY_TRACT | Status: AC

## 2014-03-19 NOTE — Progress Notes (Signed)
Internal Medicine Clinic Attending  Case discussed with Dr. Gill soon after the resident saw the patient.  We reviewed the resident's history and exam and pertinent patient test results.  I agree with the assessment, diagnosis, and plan of care documented in the resident's note.  

## 2014-03-19 NOTE — Progress Notes (Signed)
Patient ID: [name], female   DOB: Jan 08, 1951, 64 y.o.   MRN: [id number]    Subjective:   Patient ID: [name] female    DOB: 01-Feb-1951 65 y.o.    MRN: [id number] Health Maintenance Due: Health Maintenance Due  Topic Date Due  . TETANUS/TDAP  02/22/1970  . COLONOSCOPY  02/22/2001  . ZOSTAVAX  02/23/2011  . PAP SMEAR  03/29/2011  . INFLUENZA VACCINE  10/12/2013    _________________________________________________  HPI: [name] is a 64 y.o. female here for an acute visit.  Pt has a PMH outlined below.  Please see problem-based charting assessment and plan note for further details of medical issues addressed at today's visit.  PMH: Past Medical History  Diagnosis Date  . Depression   . Suicidal ideation 2007     attempted overdose 2012  Dr. Tivis Ringer report  . Bipolar disorder   . Substance abuse      narcotics, alcohol, tobacco  . Chronic pain syndrome     follows at pain managment  . DEFICIENCY, VITAMIN D NOS 01/03/2007  . Chronic lower back pain   . Anxiety   . OSTEOPOROSIS 06/17/2009    DEXA 05/2009 : L femur -2.9; R femur -2.5. Alendronate on med list but not taking. Needs addressed ASAP as h/o fractures.    . Elevated liver function tests   . On home oxygen therapy     "2L; 24/7" (02/27/2013)  . Shortness of breath     "all the time" (02/27/2013)  . COPD (chronic obstructive pulmonary disease)     oxygen dependent  . Sciatic pain   . Arthritis     "legs, back; hips, primarily left hip" (02/27/2013)  . Anemia     Medications: Current Outpatient Prescriptions on File Prior to Visit  Medication Sig Dispense Refill  . albuterol (PROVENTIL) (2.5 MG/3ML) 0.083% nebulizer solution Take 3 mLs (2.5 mg total) by nebulization every 4 (four) hours as needed. Shortness of breath 75 mL 12  . AMBULATORY NON FORMULARY MEDICATION Continuous O2 @@ 3LPM    . calcium carbonate (OS-CAL) 600 MG TABS tablet Take 600 mg by mouth 2 (two) times daily with a meal.     . cholecalciferol (VITAMIN D) 1000 UNITS tablet Take 1,000 Units by mouth daily.    Marland Kitchen docusate sodium (COLACE) 100 MG capsule Take 100 mg by mouth 2 (two) times daily as needed for mild constipation.    . DULoxetine (CYMBALTA) 30 MG capsule Take 30 mg by mouth 2 (two) times daily.     . ferrous sulfate 325 (65 FE) MG tablet Take 1 tablet (325 mg total) by mouth 3 (three) times daily with meals. 30 tablet 0  . fexofenadine (ALLEGRA) 180 MG tablet Take 180 mg by mouth daily.    . metoCLOPramide (REGLAN) 10 MG tablet Take 1 tablet (10 mg total) by mouth at bedtime. 30 tablet 6  . OVER THE COUNTER MEDICATION Take 1 tablet by mouth daily. Bone health vitamin    . PROAIR HFA 108 (90 BASE) MCG/ACT inhaler INHALE ONE PUFF BY MOUTH EVERY 2 TO 4 HOURS AS NEEDED FOR WHEEZING 8.5 g 1  . Tapentadol HCl (NUCYNTA) 100 MG TABS Take 100 mg by mouth 4 (four) times daily.     No current facility-administered medications on file prior to visit.    Allergies: Allergies  Allergen Reactions  . Banana Shortness Of Breath and Nausea And Vomiting  . Pollen Extract Other (See Comments)  Seasonal     FH: Family History  Problem Relation Age of Onset  . Stroke Neg Hx   . Cancer Neg Hx   . Myelodysplastic syndrome Father     Died from myelofibrosis though diagnosed post-mortem    SH: History   Social History  . Marital Status: Single    Spouse Name: N/A    Number of Children: 2  . Years of Education: N/A   Occupational History  . retired    Social History Main Topics  . Smoking status: Current Every Day Smoker -- 0.20 packs/day for 44 years    Types: Cigarettes  . Smokeless tobacco: Never Used     Comment: 02/27/2013 "smoking 2 cigarettes/day; that's down from 2 ppd"  . Alcohol Use: No     Comment: 02/27/2013 "nothing in 6 years; never had a problem w/it"  . Drug Use: No  . Sexual Activity: No   Other Topics Concern  . Not on file   Social History Narrative  . No narrative on file     Review of Systems: Constitutional: Negative for fever, chills and +weight loss.  Eyes: Negative for blurred vision.  Respiratory:+cough and shortness of breath.  Cardiovascular: Negative for chest pain, palpitations and leg swelling.  Gastrointestinal: Negative for nausea, vomiting, abdominal pain, diarrhea, constipation and blood in stool.  Genitourinary: Negative for dysuria, urgency and frequency.  Musculoskeletal: +myalgias and +back pain.  Neurological: Negative for dizziness, +weakness and -headaches.     Objective:   Vital Signs: Filed Vitals:   03/19/14 1532  BP: 133/61  Pulse: 77  Temp: 97.8 F (36.6 C)  TempSrc: Oral  SpO2: 100%      BP Readings from Last 3 Encounters:  03/19/14 133/61  11/13/13 139/79  11/11/13 167/88    Physical Exam: Constitutional: Vital signs reviewed.  Patient is a pale, cachetic, chronically ill appearing female.    Head: Normocephalic and atraumatic. Eyes: PERRL, EOMI, conjunctivae pale, no scleral icterus.  Throat: Poor dentition.  Neck: Supple. Cardiovascular: RRR, no MRG. Pulmonary/Chest: normal effort, minimal wheezing at the bases, no rales or rhonchi.  Abdominal: Thin. Soft. NT/ND +BS. Neurological: A&O x3, cranial nerves II-XII are grossly intact, moving all extremities. Extremities: 2+DP b/l; no pitting edema. Skin: Warm, dry and intact. No rash.   Assessment & Plan:   Assessment and plan was discussed and formulated with my attending.

## 2014-03-19 NOTE — Assessment & Plan Note (Signed)
-  needs an OV for health maintenance only

## 2014-03-19 NOTE — Assessment & Plan Note (Addendum)
Pt reports feeling bad for the past month-->describes this as generalized weakness.  She did not go to church on Sunday because she felt bad.  Reports increased SOB, cough with increased sputum production and purulence, chest tightness.  Denies any fever/chills, CP, N/V.  Has been using 3L of oxygen which is her regular requirement.  She has been using her nebulizer tx every 4h like she is prescribed.  She does state that she has been out of advair for over 2 weeks for no specific reason.  Denies sick contacts.  On physical exam, O2 sats are 100% on 3L.  She is afebrile without tachycardia and speaking in full sentences.  Lungs are clear with minimal wheezing only at the bases.  She does appear very pale.  She ambulates with a walker but is in no obvious distress.  I would not tx with abx at this time as likely viral and afebrile with normal O2 sats and clear CXR.  -CXR obtained today shows no acute disease -symptomatic tx for now  -advised her to return next week if she does not feel any better or her symptoms worsen -advised her to get her prescription for advair at the pharmacy today  -check CMP, cbc/diff today  -advised her to drink plenty of fluids to maintain hydration  -will see if Edwena Blow is able to have the aide check her O2 sats at home

## 2014-03-19 NOTE — Patient Instructions (Addendum)
Thank you for your visit today.   Please return to the internal medicine clinic on 04/14/2014 to see Dr. Posey Pronto, your primary doctor.   Your current medical regimen is effective;  continue present plan and take all medications as prescribed.   Please pick up your prescription for advair at your pharmacy and take this twice daily. We will see if you aide will be able to check your oxygen level at home.  Please return next week if you do not get to feeling better. You likely have an viral infection. I will call you only if your lab results are abnormal.   Please be sure to bring all of your medications with you to every visit; this includes herbal supplements, vitamins, eye drops, and any over-the-counter medications.   Should you have any questions regarding your medications and/or any new or worsening symptoms, please be sure to call the clinic at [phone].   If you believe that you are suffering from a life threatening condition or one that may result in the loss of limb or function, then you should call 911 or proceed to the nearest Emergency Department.   Upper Respiratory Infection, Adult An upper respiratory infection (URI) is also sometimes known as the common cold. The upper respiratory tract includes the nose, sinuses, throat, trachea, and bronchi. Bronchi are the airways leading to the lungs. Most people improve within 1 week, but symptoms can last up to 2 weeks. A residual cough may last even longer.  CAUSES Many different viruses can infect the tissues lining the upper respiratory tract. The tissues become irritated and inflamed and often become very moist. Mucus production is also common. A cold is contagious. You can easily spread the virus to others by oral contact. This includes kissing, sharing a glass, coughing, or sneezing. Touching your mouth or nose and then touching a surface, which is then touched by another person, can also spread the virus. SYMPTOMS  Symptoms typically  develop 1 to 3 days after you come in contact with a cold virus. Symptoms vary from person to person. They may include:  Runny nose.  Sneezing.  Nasal congestion.  Sinus irritation.  Sore throat.  Loss of voice (laryngitis).  Cough.  Fatigue.  Muscle aches.  Loss of appetite.  Headache.  Low-grade fever. DIAGNOSIS  You might diagnose your own cold based on familiar symptoms, since most people get a cold 2 to 3 times a year. Your caregiver can confirm this based on your exam. Most importantly, your caregiver can check that your symptoms are not due to another disease such as strep throat, sinusitis, pneumonia, asthma, or epiglottitis. Blood tests, throat tests, and X-rays are not necessary to diagnose a common cold, but they may sometimes be helpful in excluding other more serious diseases. Your caregiver will decide if any further tests are required. RISKS AND COMPLICATIONS  You may be at risk for a more severe case of the common cold if you smoke cigarettes, have chronic heart disease (such as heart failure) or lung disease (such as asthma), or if you have a weakened immune system. The very young and very old are also at risk for more serious infections. Bacterial sinusitis, middle ear infections, and bacterial pneumonia can complicate the common cold. The common cold can worsen asthma and chronic obstructive pulmonary disease (COPD). Sometimes, these complications can require emergency medical care and may be life-threatening. PREVENTION  The best way to protect against getting a cold is to practice good hygiene. Avoid oral  or hand contact with people with cold symptoms. Wash your hands often if contact occurs. There is no clear evidence that vitamin C, vitamin E, echinacea, or exercise reduces the chance of developing a cold. However, it is always recommended to get plenty of rest and practice good nutrition. TREATMENT  Treatment is directed at relieving symptoms. There is no cure.  Antibiotics are not effective, because the infection is caused by a virus, not by bacteria. Treatment may include:  Increased fluid intake. Sports drinks offer valuable electrolytes, sugars, and fluids.  Breathing heated mist or steam (vaporizer or shower).  Eating chicken soup or other clear broths, and maintaining good nutrition.  Getting plenty of rest.  Using gargles or lozenges for comfort.  Controlling fevers with ibuprofen or acetaminophen as directed by your caregiver.  Increasing usage of your inhaler if you have asthma. Zinc gel and zinc lozenges, taken in the first 24 hours of the common cold, can shorten the duration and lessen the severity of symptoms. Pain medicines may help with fever, muscle aches, and throat pain. A variety of non-prescription medicines are available to treat congestion and runny nose. Your caregiver can make recommendations and may suggest nasal or lung inhalers for other symptoms.  HOME CARE INSTRUCTIONS   Only take over-the-counter or prescription medicines for pain, discomfort, or fever as directed by your caregiver.  Use a warm mist humidifier or inhale steam from a shower to increase air moisture. This may keep secretions moist and make it easier to breathe.  Drink enough water and fluids to keep your urine clear or pale yellow.  Rest as needed.  Return to work when your temperature has returned to normal or as your caregiver advises. You may need to stay home longer to avoid infecting others. You can also use a face mask and careful hand washing to prevent spread of the virus. SEEK MEDICAL CARE IF:   After the first few days, you feel you are getting worse rather than better.  You need your caregiver's advice about medicines to control symptoms.  You develop chills, worsening shortness of breath, or brown or red sputum. These may be signs of pneumonia.  You develop yellow or brown nasal discharge or pain in the face, especially when you bend  forward. These may be signs of sinusitis.  You develop a fever, swollen neck glands, pain with swallowing, or white areas in the back of your throat. These may be signs of strep throat. SEEK IMMEDIATE MEDICAL CARE IF:   You have a fever.  You develop severe or persistent headache, ear pain, sinus pain, or chest pain.  You develop wheezing, a prolonged cough, cough up blood, or have a change in your usual mucus (if you have chronic lung disease).  You develop sore muscles or a stiff neck. Document Released: 08/24/2000 Document Revised: 05/23/2011 Document Reviewed: 06/05/2013 [facility] Patient Information 2015 [place], [place]. This information is not intended to replace advice given to you by your health care provider. Make sure you discuss any questions you have with your health care provider.

## 2014-03-20 ENCOUNTER — Other Ambulatory Visit: Payer: Self-pay | Admitting: Internal Medicine

## 2014-03-20 ENCOUNTER — Encounter (HOSPITAL_COMMUNITY): Payer: Self-pay | Admitting: General Practice

## 2014-03-20 ENCOUNTER — Ambulatory Visit: Payer: Medicare Other | Admitting: Internal Medicine

## 2014-03-20 ENCOUNTER — Inpatient Hospital Stay (HOSPITAL_COMMUNITY)
Admission: AD | Admit: 2014-03-20 | Discharge: 2014-03-22 | DRG: 811 | Disposition: A | Discharge status: Home / Self Care | Payer: Medicare Other | Source: Ambulatory Visit | Attending: Internal Medicine | Admitting: Internal Medicine

## 2014-03-20 ENCOUNTER — Encounter: Payer: Self-pay | Admitting: Internal Medicine

## 2014-03-20 ENCOUNTER — Other Ambulatory Visit (INDEPENDENT_AMBULATORY_CARE_PROVIDER_SITE_OTHER): Payer: Medicare Other

## 2014-03-20 DIAGNOSIS — F419 Anxiety disorder, unspecified: Secondary | ICD-10-CM | POA: Diagnosis not present

## 2014-03-20 DIAGNOSIS — F319 Bipolar disorder, unspecified: Secondary | ICD-10-CM | POA: Diagnosis present

## 2014-03-20 DIAGNOSIS — E43 Unspecified severe protein-calorie malnutrition: Secondary | ICD-10-CM | POA: Diagnosis present

## 2014-03-20 DIAGNOSIS — F3132 Bipolar disorder, current episode depressed, moderate: Secondary | ICD-10-CM | POA: Insufficient documentation

## 2014-03-20 DIAGNOSIS — M810 Age-related osteoporosis without current pathological fracture: Secondary | ICD-10-CM | POA: Diagnosis not present

## 2014-03-20 DIAGNOSIS — J449 Chronic obstructive pulmonary disease, unspecified: Secondary | ICD-10-CM | POA: Diagnosis present

## 2014-03-20 DIAGNOSIS — D509 Iron deficiency anemia, unspecified: Principal | ICD-10-CM | POA: Diagnosis present

## 2014-03-20 DIAGNOSIS — E0590 Thyrotoxicosis, unspecified without thyrotoxic crisis or storm: Secondary | ICD-10-CM | POA: Diagnosis present

## 2014-03-20 DIAGNOSIS — Z681 Body mass index (BMI) 19 or less, adult: Secondary | ICD-10-CM | POA: Diagnosis not present

## 2014-03-20 DIAGNOSIS — F17210 Nicotine dependence, cigarettes, uncomplicated: Secondary | ICD-10-CM | POA: Diagnosis not present

## 2014-03-20 DIAGNOSIS — G8929 Other chronic pain: Secondary | ICD-10-CM | POA: Diagnosis not present

## 2014-03-20 DIAGNOSIS — Z9981 Dependence on supplemental oxygen: Secondary | ICD-10-CM | POA: Diagnosis not present

## 2014-03-20 DIAGNOSIS — M1990 Unspecified osteoarthritis, unspecified site: Secondary | ICD-10-CM | POA: Diagnosis present

## 2014-03-20 DIAGNOSIS — Z66 Do not resuscitate: Secondary | ICD-10-CM | POA: Diagnosis present

## 2014-03-20 DIAGNOSIS — G894 Chronic pain syndrome: Secondary | ICD-10-CM | POA: Diagnosis not present

## 2014-03-20 DIAGNOSIS — D649 Anemia, unspecified: Secondary | ICD-10-CM | POA: Diagnosis not present

## 2014-03-20 DIAGNOSIS — R0602 Shortness of breath: Secondary | ICD-10-CM | POA: Diagnosis present

## 2014-03-20 HISTORY — DX: Thyrotoxicosis, unspecified without thyrotoxic crisis or storm: E05.90

## 2014-03-20 HISTORY — DX: Personal history of other medical treatment: Z92.89

## 2014-03-20 HISTORY — DX: Pneumonia, unspecified organism: J18.9

## 2014-03-20 LAB — CBC WITH DIFFERENTIAL/PLATELET
Basophils Absolute: 0 10*3/uL (ref 0.0–0.1)
Basophils Relative: 0 % (ref 0–1)
Eosinophils Absolute: 0 10*3/uL (ref 0.0–0.7)
Eosinophils Relative: 0 % (ref 0–5)
HCT: 18.5 % — ABNORMAL LOW (ref 36.0–46.0)
Hemoglobin: 5.3 g/dL — CL (ref 12.0–15.0)
LYMPHS ABS: 2.1 10*3/uL (ref 0.7–4.0)
Lymphocytes Relative: 21 % (ref 12–46)
MCH: 22.5 pg — AB (ref 26.0–34.0)
MCHC: 28.6 g/dL — ABNORMAL LOW (ref 30.0–36.0)
MCV: 78.4 fL (ref 78.0–100.0)
MONOS PCT: 10 % (ref 3–12)
Monocytes Absolute: 1 10*3/uL (ref 0.1–1.0)
Neutro Abs: 6.9 10*3/uL (ref 1.7–7.7)
Neutrophils Relative %: 68 % (ref 43–77)
Platelets: 844 10*3/uL — ABNORMAL HIGH (ref 150–400)
RBC: 2.36 MIL/uL — AB (ref 3.87–5.11)
RDW: 17.2 % — ABNORMAL HIGH (ref 11.5–15.5)
WBC: 10.1 10*3/uL (ref 4.0–10.5)

## 2014-03-20 LAB — URINALYSIS, ROUTINE W REFLEX MICROSCOPIC
Bilirubin Urine: NEGATIVE
Glucose, UA: NEGATIVE mg/dL
HGB URINE DIPSTICK: NEGATIVE
KETONES UR: NEGATIVE mg/dL
Leukocytes, UA: NEGATIVE
Nitrite: NEGATIVE
PROTEIN: NEGATIVE mg/dL
Specific Gravity, Urine: 1.016 (ref 1.005–1.030)
UROBILINOGEN UA: 0.2 mg/dL (ref 0.0–1.0)
pH: 5 (ref 5.0–8.0)

## 2014-03-20 LAB — COMPLETE METABOLIC PANEL WITH GFR
ALK PHOS: 101 U/L (ref 39–117)
ALT: 9 U/L (ref 0–35)
AST: 15 U/L (ref 0–37)
Albumin: 3.8 g/dL (ref 3.5–5.2)
BILIRUBIN TOTAL: 0.1 mg/dL — AB (ref 0.2–1.2)
BUN: 19 mg/dL (ref 6–23)
CHLORIDE: 95 meq/L — AB (ref 96–112)
CO2: 26 mEq/L (ref 19–32)
CREATININE: 0.48 mg/dL — AB (ref 0.50–1.10)
Calcium: 7.6 mg/dL — ABNORMAL LOW (ref 8.4–10.5)
Glucose, Bld: 76 mg/dL (ref 70–99)
Potassium: 4.5 mEq/L (ref 3.5–5.3)
Sodium: 132 mEq/L — ABNORMAL LOW (ref 135–145)
TOTAL PROTEIN: 6.2 g/dL (ref 6.0–8.3)

## 2014-03-20 LAB — RETICULOCYTES
RBC.: 2.44 MIL/uL — AB (ref 3.87–5.11)
Retic Count, Absolute: 85.4 10*3/uL (ref 19.0–186.0)
Retic Ct Pct: 3.5 % — ABNORMAL HIGH (ref 0.4–3.1)

## 2014-03-20 LAB — PREPARE RBC (CROSSMATCH)

## 2014-03-20 LAB — LACTATE DEHYDROGENASE: LDH: 178 U/L (ref 94–250)

## 2014-03-20 LAB — ABO/RH: ABO/RH(D): O POS

## 2014-03-20 MED ORDER — ALBUTEROL SULFATE HFA 108 (90 BASE) MCG/ACT IN AERS: 1.0000 | INHALATION_SPRAY | Freq: Four times a day (QID) | RESPIRATORY_TRACT | Status: DC | PRN

## 2014-03-20 MED ORDER — ENSURE COMPLETE PO LIQD
237.0000 mL | Freq: Two times a day (BID) | ORAL | Status: DC
  Administered 2014-03-21 (×2): 237 mL via ORAL

## 2014-03-20 MED ORDER — DULOXETINE HCL 30 MG PO CPEP: 30.0000 mg | ORAL_CAPSULE | Freq: Two times a day (BID) | ORAL | Status: DC

## 2014-03-20 MED ORDER — ACETAMINOPHEN 325 MG PO TABS: 650.0000 mg | ORAL_TABLET | Freq: Four times a day (QID) | ORAL | Status: DC | PRN

## 2014-03-20 MED ORDER — OXYCODONE HCL 5 MG PO TABS
15.0000 mg | ORAL_TABLET | Freq: Four times a day (QID) | ORAL | Status: DC | PRN
  Administered 2014-03-20 – 2014-03-22 (×7): 15 mg via ORAL
  Filled 2014-03-20 (×7): qty 3

## 2014-03-20 MED ORDER — CALCIUM CARBONATE 1250 (500 CA) MG PO TABS
1.0000 | ORAL_TABLET | Freq: Two times a day (BID) | ORAL | Status: DC
  Administered 2014-03-20 – 2014-03-22 (×4): 500 mg via ORAL
  Filled 2014-03-20 (×6): qty 1

## 2014-03-20 MED ORDER — ALBUTEROL SULFATE (2.5 MG/3ML) 0.083% IN NEBU: 2.5000 mg | INHALATION_SOLUTION | RESPIRATORY_TRACT | Status: DC

## 2014-03-20 MED ORDER — SODIUM CHLORIDE 0.9 % IJ SOLN
3.0000 mL | Freq: Two times a day (BID) | INTRAMUSCULAR | Status: DC
  Administered 2014-03-21 – 2014-03-22 (×3): 3 mL via INTRAVENOUS

## 2014-03-20 MED ORDER — ALBUTEROL SULFATE (2.5 MG/3ML) 0.083% IN NEBU: 2.5000 mg | INHALATION_SOLUTION | RESPIRATORY_TRACT | Status: DC | PRN

## 2014-03-20 MED ORDER — MOMETASONE FURO-FORMOTEROL FUM 100-5 MCG/ACT IN AERO
2.0000 | INHALATION_SPRAY | Freq: Two times a day (BID) | RESPIRATORY_TRACT | Status: DC
Start: 2014-03-20 — End: 2014-03-22
  Administered 2014-03-20 – 2014-03-22 (×4): 2 via RESPIRATORY_TRACT
  Filled 2014-03-20: qty 8.8

## 2014-03-20 MED ORDER — SODIUM CHLORIDE 0.9 % IV SOLN
Freq: Once | INTRAVENOUS | Status: AC
  Administered 2014-03-20: 20:00:00 via INTRAVENOUS

## 2014-03-20 MED ORDER — CETYLPYRIDINIUM CHLORIDE 0.05 % MT LIQD
7.0000 mL | Freq: Two times a day (BID) | OROMUCOSAL | Status: DC
  Administered 2014-03-20 – 2014-03-21 (×3): 7 mL via OROMUCOSAL

## 2014-03-20 MED ORDER — SODIUM CHLORIDE 0.9 % IJ SOLN: 3.0000 mL | INTRAMUSCULAR | Status: DC | PRN

## 2014-03-20 MED ORDER — SODIUM CHLORIDE 0.9 % IV SOLN: 250.0000 mL | INTRAVENOUS | Status: DC | PRN

## 2014-03-20 MED ORDER — ALBUTEROL SULFATE (2.5 MG/3ML) 0.083% IN NEBU: 2.5000 mg | INHALATION_SOLUTION | Freq: Four times a day (QID) | RESPIRATORY_TRACT | Status: DC | PRN

## 2014-03-20 MED ORDER — DOCUSATE SODIUM 100 MG PO CAPS: 100.0000 mg | ORAL_CAPSULE | Freq: Two times a day (BID) | ORAL | Status: DC | PRN

## 2014-03-20 MED ORDER — ACETAMINOPHEN 650 MG RE SUPP: 650.0000 mg | Freq: Four times a day (QID) | RECTAL | Status: DC | PRN

## 2014-03-20 MED ORDER — ALBUTEROL SULFATE (2.5 MG/3ML) 0.083% IN NEBU
2.5000 mg | INHALATION_SOLUTION | RESPIRATORY_TRACT | Status: DC
  Administered 2014-03-20 – 2014-03-21 (×5): 2.5 mg via RESPIRATORY_TRACT
  Filled 2014-03-20 (×5): qty 3

## 2014-03-20 MED ORDER — ONDANSETRON HCL 4 MG/2ML IJ SOLN: 4.0000 mg | Freq: Four times a day (QID) | INTRAMUSCULAR | Status: DC | PRN

## 2014-03-20 MED ORDER — INFLUENZA VAC SPLIT QUAD 0.5 ML IM SUSY
0.5000 mL | PREFILLED_SYRINGE | INTRAMUSCULAR | Status: AC
  Administered 2014-03-21: 0.5 mL via INTRAMUSCULAR
  Filled 2014-03-20: qty 0.5

## 2014-03-20 MED ORDER — ONDANSETRON HCL 4 MG PO TABS
4.0000 mg | ORAL_TABLET | Freq: Four times a day (QID) | ORAL | Status: DC | PRN
Start: 2014-03-20 — End: 2014-03-22

## 2014-03-20 MED ORDER — CALCIUM CARBONATE 600 MG PO TABS: 600.0000 mg | ORAL_TABLET | Freq: Two times a day (BID) | ORAL | Status: DC

## 2014-03-20 MED ORDER — METOCLOPRAMIDE HCL 10 MG PO TABS: 10.0000 mg | ORAL_TABLET | Freq: Every day | ORAL | Status: DC

## 2014-03-20 MED ORDER — FERROUS SULFATE 325 (65 FE) MG PO TABS
325.0000 mg | ORAL_TABLET | Freq: Three times a day (TID) | ORAL | Status: DC
  Administered 2014-03-20 – 2014-03-22 (×6): 325 mg via ORAL
  Filled 2014-03-20 (×8): qty 1

## 2014-03-20 MED ORDER — LORATADINE 10 MG PO TABS
10.0000 mg | ORAL_TABLET | Freq: Every day | ORAL | Status: DC
  Administered 2014-03-21 – 2014-03-22 (×2): 10 mg via ORAL
  Filled 2014-03-20 (×2): qty 1

## 2014-03-20 NOTE — Assessment & Plan Note (Addendum)
Pt presented to clinic yesterday with increased SOB and fatigue.  States she has been feeling more tired and "crummy" over the past month or so.  I checked her hemoglobin yesterday and it had fallen to 7.1.  I asked the lab to repeat this but the value has not come back yet.  Therefore, I requested she come in today to repeat the hgb and it was 5.3 today.  She denies any bleeding.  No vaginal bleeding, hematuria, hematochezia, melena, hemoptysis.  No abdominal pain.  She still complains of SOB and worsening fatigue.  She has never had a colonoscopy due to her COPD.  I do not believe she has ever had an EGD either.  She refuses a rectal exam today.  On physical exam, she is a chronically ill appearing female who is very pale.  Conjunctivae are pale.  She was discharged in September 2015 for acute respiratory failure.  At that time anemia panel showed iron: 14, ferritin: 10, B12: 961, folate: 13.7.  She was discharged on ferrous sulfate 325mg  tid, however, she did not understand that she was supposed to be taking iron supplementation.   -will need to admit given symptomatic anemia with a hgb of 5.3 and will need transfusion  -consider ferraheme  -peripheral smear, LDH  -continue ferrous sulfate  -further workup to determine the source of bleeding

## 2014-03-20 NOTE — Progress Notes (Signed)
On Call MD paged notifying of patients arrival to unit.

## 2014-03-20 NOTE — Progress Notes (Signed)
Patient ID: [name], female   DOB: 20-Jan-1951, 64 y.o.   MRN: [id number]    Subjective:   Patient ID: [name] female    DOB: 1950/09/30 64 y.o.    MRN: [id number] Health Maintenance Due: Health Maintenance Due  Topic Date Due  . TETANUS/TDAP  02/22/1970  . COLONOSCOPY  02/22/2001  . ZOSTAVAX  02/23/2011  . PAP SMEAR  03/29/2011  . INFLUENZA VACCINE  10/12/2013    _________________________________________________  HPI: [name] is a 64 y.o. female here for a follow-up visit for an acute drop in hemoglobin.  Pt has a PMH outlined below.  Please see problem-based charting assessment and plan note for further details of medical issues addressed at today's visit.  PMH: Past Medical History  Diagnosis Date  . Depression   . Suicidal ideation 2007     attempted overdose 2012  Dr. Tivis Ringer report  . Bipolar disorder   . Substance abuse      narcotics, alcohol, tobacco  . Chronic pain syndrome     follows at pain managment  . DEFICIENCY, VITAMIN D NOS 01/03/2007  . Chronic lower back pain   . Anxiety   . OSTEOPOROSIS 06/17/2009    DEXA 05/2009 : L femur -2.9; R femur -2.5. Alendronate on med list but not taking. Needs addressed ASAP as h/o fractures.    . Elevated liver function tests   . On home oxygen therapy     "2L; 24/7" (02/27/2013)  . Shortness of breath     "all the time" (02/27/2013)  . COPD (chronic obstructive pulmonary disease)     oxygen dependent  . Sciatic pain   . Arthritis     "legs, back; hips, primarily left hip" (02/27/2013)  . Anemia     Medications: Current Outpatient Prescriptions on File Prior to Visit  Medication Sig Dispense Refill  . albuterol (PROVENTIL) (2.5 MG/3ML) 0.083% nebulizer solution Take 3 mLs (2.5 mg total) by nebulization every 4 (four) hours as needed. Shortness of breath 75 mL 12  . AMBULATORY NON FORMULARY MEDICATION Continuous O2 @@ 3LPM    . calcium carbonate (OS-CAL) 600 MG TABS tablet Take 600 mg by  mouth 2 (two) times daily with a meal.    . cholecalciferol (VITAMIN D) 1000 UNITS tablet Take 1,000 Units by mouth daily.    Marland Kitchen docusate sodium (COLACE) 100 MG capsule Take 100 mg by mouth 2 (two) times daily as needed for mild constipation.    . DULoxetine (CYMBALTA) 30 MG capsule Take 30 mg by mouth 2 (two) times daily.     . fexofenadine (ALLEGRA) 180 MG tablet Take 180 mg by mouth daily.    . Fluticasone-Salmeterol (ADVAIR DISKUS) 250-50 MCG/DOSE AEPB INHALE 1 PUFF INTO THE LUNGS 2 TIMES DAILY 5 each 6  . metoCLOPramide (REGLAN) 10 MG tablet Take 1 tablet (10 mg total) by mouth at bedtime. 30 tablet 6  . OVER THE COUNTER MEDICATION Take 1 tablet by mouth daily. Bone health vitamin    . PROAIR HFA 108 (90 BASE) MCG/ACT inhaler INHALE ONE PUFF BY MOUTH EVERY 2 TO 4 HOURS AS NEEDED FOR WHEEZING 8.5 g 1  . Tapentadol HCl (NUCYNTA) 100 MG TABS Take 100 mg by mouth 4 (four) times daily.    . ferrous sulfate 325 (65 FE) MG tablet Take 1 tablet (325 mg total) by mouth 3 (three) times daily with meals. (Patient not taking: Reported on 03/20/2014) 30 tablet 0   No current facility-administered medications  on file prior to visit.    Allergies: Allergies  Allergen Reactions  . Banana Shortness Of Breath and Nausea And Vomiting  . Pollen Extract Other (See Comments)    Seasonal     FH: Family History  Problem Relation Age of Onset  . Stroke Neg Hx   . Cancer Neg Hx   . Myelodysplastic syndrome Father     Died from myelofibrosis though diagnosed post-mortem    SH: History   Social History  . Marital Status: Single    Spouse Name: N/A    Number of Children: 2  . Years of Education: N/A   Occupational History  . retired    Social History Main Topics  . Smoking status: Current Every Day Smoker -- 0.20 packs/day for 44 years    Types: Cigarettes  . Smokeless tobacco: Never Used     Comment: 02/27/2013 "smoking 2 cigarettes/day; that's down from 2 ppd"  . Alcohol Use: No     Comment:  02/27/2013 "nothing in 6 years; never had a problem w/it"  . Drug Use: No  . Sexual Activity: No   Other Topics Concern  . Not on file   Social History Narrative    Review of Systems: Constitutional: Negative for fever, chills and weight loss.  Eyes: Negative for blurred vision.  Respiratory: +cough and shortness of breath.  Cardiovascular: Negative for chest pain, palpitations and leg swelling.  Gastrointestinal: Negative for nausea, vomiting, abdominal pain, diarrhea, constipation and blood in stool.  Genitourinary: Negative for dysuria, urgency and frequency.  Musculoskeletal: Negative for myalgias and back pain.  Neurological: Negative for dizziness, +weakness and -headaches.     Objective:   Vital Signs: There were no vitals filed for this visit.    BP Readings from Last 3 Encounters:  03/19/14 133/61  11/13/13 139/79  11/11/13 167/88    Physical Exam: Constitutional: Vital signs reviewed.  Patient is a chronically ill-appearing female who appears very pale and cachetic.    Head: Normocephalic and atraumatic. Eyes: PERRL, EOMI, conjunctivae pale, no scleral icterus.  Neck: Supple. Cardiovascular: RRR, no MRG. Pulmonary/Chest: normal effort, non-tender to palpation, CTAB, minimal wheezes at the bases, no rales or rhonchi. Abdominal: Thin. Soft. NT/ND +BS. Neurological: A&O x3, cranial nerves II-XII are grossly intact, moving all extremities. Extremities: 2+DP b/l; no pitting edema. Skin: Warm, dry and intact. No rash. Rectal: Refused.    Assessment & Plan:   Assessment and plan was discussed and formulated with my attending.  Pt to be admitted for further evaluation.

## 2014-03-20 NOTE — Progress Notes (Signed)
Internal Medicine Clinic Attending  Case discussed with Dr. Gill at the time of the visit.  We reviewed the resident's history and exam and pertinent patient test results.  I agree with the assessment, diagnosis, and plan of care documented in the resident's note.  

## 2014-03-20 NOTE — Progress Notes (Signed)
[name] [id number] Code Status: DNR Admission Data: 03/20/2014 6:25 PM Attending Provider:  Daryll Drown OOI:[id number], Fayrene Fearing, MD Consults/ Treatment Team:    [name] is a 64 y.o. female patient admitted from ED awake, alert - oriented  X 3 - no acute distress noted.  VSS - Blood pressure 117/52, pulse 81, temperature 98.7 F (37.1 C), temperature source Oral, resp. rate 18, height 5\' 1"  (1.549 m), weight 39.282 kg (86 lb 9.6 oz), SpO2 100 %.    IV in place, occlusive dsg intact without redness.  Orientation to room, and floor completed with information packet given to patient/family.  Patient declined safety video at this time.  Admission INP armband ID verified with patient/family, and in place.   SR up x 2, fall assessment complete, with patient and family able to verbalize understanding of risk associated with falls, and verbalized understanding to call nsg before up out of bed.  Call light within reach, patient able to voice, and demonstrate understanding.  Skin, clean-dry- intact without evidence of bruising, or skin tears.   No evidence of skin break down noted on exam.     Will cont to eval and treat per MD orders.  Delman Cheadle, RN 03/20/2014 6:25 PM

## 2014-03-20 NOTE — Progress Notes (Signed)
Phlebotomy called to inquire about stat type and screen. Stated they are to come draw.

## 2014-03-20 NOTE — H&P (Signed)
Date: 03/20/2014               Patient Name:  [name] MRN: [id number]  DOB: 1950-03-29 Age / Sex: 64 y.o., female   PCP: Charlott Rakes, MD         Medical Service: Internal Medicine Teaching Service         Attending Physician: Dr. Sid Falcon, MD    First Contact: Dr. Charlott Rakes Pager: [phone]  Second Contact: Dr. Bing Neighbors Pager: [phone]       After Hours (After 5p/  First Contact Pager: [phone]  weekends / holidays): Second Contact Pager: [phone]   Chief Complaint: fatigue, shortness of breath  History of Present Illness: [name] is a 64 year old female with bipolar disorder, COPD (2L O2), Fe deficiency anemia, osteoporosis, chronic pain, severe protein-calorie malnutrition who presented with fatigue and shortness of breath.  Yesterday, she was seen by Dr. Gordy Levan in clinic and reported worsening fatigue, dyspnea, productive cough, chest tightness though was not in any respiratory distress. CXR along with CBC/CMET were ordered, and she was sent out with symptomatic treatment for presumed viral URI. Hb was initially found to be 7.1, and she was called back for repeat testing; Hb was found to be 5.3, and it was decided she would need to be admitted. She denies any signs of bleeding: hemoptysis, hematochezia, melena, vaginal bleeding.   She was last hospitalized 11/08/13-11/11/13 for acute on chronic respiratory failure in the setting of CAP and found to have Fe deficiency [Fe 14, ferritin 10]. Though she was discharged on Fe tablets, she reports not knowing that she was supposed to take this medicine.    Meds: Current Facility-Administered Medications  Medication Dose Route Frequency Provider Last Rate Last Dose  . 0.9 %  sodium chloride infusion  250 mL Intravenous PRN Blain Pais, MD      . 0.9 %  sodium chloride infusion   Intravenous Once Blain Pais, MD      . acetaminophen (TYLENOL) tablet 650 mg  650 mg Oral Q6H PRN Blain Pais, MD         Or  . acetaminophen (TYLENOL) suppository 650 mg  650 mg Rectal Q6H PRN Blain Pais, MD      . albuterol (PROVENTIL) (2.5 MG/3ML) 0.083% nebulizer solution 2.5 mg  2.5 mg Nebulization Q4H Blain Pais, MD      . albuterol (PROVENTIL) (2.5 MG/3ML) 0.083% nebulizer solution 2.5 mg  2.5 mg Inhalation Q4H PRN Blain Pais, MD      . antiseptic oral rinse (CPC / CETYLPYRIDINIUM CHLORIDE 0.05%) solution 7 mL  7 mL Mouth Rinse BID Sid Falcon, MD      . calcium carbonate (OS-CAL - dosed in mg of elemental calcium) tablet 500 mg of elemental calcium  1 tablet Oral BID WC Sid Falcon, MD   500 mg of elemental calcium at 03/20/14 1853  . docusate sodium (COLACE) capsule 100 mg  100 mg Oral BID PRN Blain Pais, MD      . Derrill Memo ON 03/21/2014] feeding supplement (ENSURE COMPLETE) (ENSURE COMPLETE) liquid 237 mL  237 mL Oral BID BM Sid Falcon, MD      . ferrous sulfate tablet 325 mg  325 mg Oral TID WC Blain Pais, MD   325 mg at 03/20/14 1853  . [START ON 03/21/2014] Influenza vac split quadrivalent PF (FLUARIX) injection 0.5 mL  0.5 mL Intramuscular Tomorrow-1000 Raquel Sarna B  Daryll Drown, MD      . Derrill Memo ON 03/21/2014] loratadine (CLARITIN) tablet 10 mg  10 mg Oral Daily Blain Pais, MD      . metoCLOPramide (REGLAN) tablet 10 mg  10 mg Oral QHS Blain Pais, MD      . mometasone-formoterol (DULERA) 100-5 MCG/ACT inhaler 2 puff  2 puff Inhalation BID Blain Pais, MD      . ondansetron [facility]) tablet 4 mg  4 mg Oral Q6H PRN Blain Pais, MD       Or  . ondansetron (ZOFRAN) injection 4 mg  4 mg Intravenous Q6H PRN Blain Pais, MD      . oxyCODONE (Oxy IR/ROXICODONE) immediate release tablet 15 mg  15 mg Oral Q6H PRN Blain Pais, MD   15 mg at 03/20/14 1826  . sodium chloride 0.9 % injection 3 mL  3 mL Intravenous Q12H Blain Pais, MD      . sodium chloride 0.9 % injection 3 mL  3 mL Intravenous PRN Blain Pais, MD         Allergies: Allergies as of 03/20/2014 - Review Complete 03/20/2014  Allergen Reaction Noted  . Banana Shortness Of Breath and Nausea And Vomiting 01/10/2012  . Pollen extract Other (See Comments) 11/20/2012   Past Medical History  Diagnosis Date  . Depression   . Suicidal ideation 2007     attempted overdose 2012  Dr. Tivis Ringer report  . Bipolar disorder   . Substance abuse      narcotics, alcohol, tobacco  . Chronic pain syndrome     [facility]s at [facility]  . DEFICIENCY, VITAMIN D NOS 01/03/2007  . Chronic lower back pain   . Anxiety   . OSTEOPOROSIS 06/17/2009    DEXA 05/2009 : L femur -2.9; R femur -2.5. Alendronate on med list but not taking. Needs addressed ASAP as h/o fractures.    . Elevated liver function tests   . On home oxygen therapy     "3L; 24/7" (03/20/2014)  . Shortness of breath     "all the time" (02/27/2013)  . COPD (chronic obstructive pulmonary disease)     oxygen dependent  . Sciatic pain   . Anemia   . Pneumonia X 1?  . Hyperthyroidism     "borderline"  . History of blood transfusion     "getting my 1st later tonight" (03/20/2014)  . Arthritis     "legs, back; hips, primarily left hip" (03/20/2014)   Past Surgical History  Procedure Laterality Date  . Orif ankle fracture Left 10/2010  . Cesarean section  1974; 1979  . Tubal ligation  1979  . Augmentation mammaplasty  ~ 2007  . Fracture surgery    . Ankle debridement Left 10/2010   Family History  Problem Relation Age of Onset  . Stroke Neg Hx   . Cancer Neg Hx   . Myelodysplastic syndrome Father     Died from myelofibrosis though diagnosed post-mortem   History   Social History  . Marital Status: Divorced    Spouse Name: N/A    Number of Children: 2  . Years of Education: N/A   Occupational History  . retired    Social History Main Topics  . Smoking status: Current Every Day Smoker -- 0.20 packs/day for 45 years    Types: Cigarettes  . Smokeless tobacco: Never Used      Comment: 03/20/2014 "smoking 2 cigarettes/day; that's down from 2 ppd"  .  Alcohol Use: Yes     Comment: 03/20/2014 "nothing in 7 years; never had a problem w/it"  . Drug Use: No  . Sexual Activity: No   Other Topics Concern  . Not on file   Social History Narrative    Review of Systems: Review of Systems  Constitutional: Positive for malaise/fatigue. Negative for fever and chills.  HENT: Negative for nosebleeds.   Respiratory: Positive for shortness of breath. Negative for hemoptysis and sputum production.   Gastrointestinal: Negative for nausea, vomiting, abdominal pain, blood in stool and melena.  Neurological: Positive for weakness.  Psychiatric/Behavioral: Positive for hallucinations.       Auditory hallucinations to avoid hospitalization though she was able to put them to the side given how poorly she feels     Physical Exam: Blood pressure 117/52, pulse 81, temperature 98.7 F (37.1 C), temperature source Oral, resp. rate 18, height 5\' 1"  (1.549 m), weight 86 lb 9.6 oz (39.282 kg), SpO2 100 %.  General: resting in bed, 3L O2 by , cachectic-appearing HEENT: PERRL, EOMI, conjunctival pallor, oropharynx clear Cardiac: RRR, no rubs, murmurs or gallops Pulm: clear to auscultation bilaterally, no wheezes, rales, or rhonchi Abd: soft, nontender, nondistended, BS present Ext: warm and well perfused, no pedal edema Neuro: responds to questions appropriately; moving all extremities freely   Lab results: Basic Metabolic Panel:  Recent Labs  03/19/14 1648  NA 132*  K 4.5  CL 95*  CO2 26  GLUCOSE 76  BUN 19  CREATININE 0.48*  CALCIUM 7.6*   Liver Function Tests:  Recent Labs  03/19/14 1648  AST 15  ALT 9  ALKPHOS 101  BILITOT 0.1*  PROT 6.2  ALBUMIN 3.8   CBC:  Recent Labs  03/19/14 1648 03/20/14 1530  WBC 11.0* 10.1  NEUTROABS 9.0* 6.9  HGB 5.2* 5.3*  HCT 18.8* 18.5*  MCV 80.3 78.4  PLT 870* 844*    Urinalysis:  Recent Labs  03/20/14 1838   COLORURINE YELLOW  LABSPEC 1.016  PHURINE 5.0  GLUCOSEU NEGATIVE  HGBUR NEGATIVE  BILIRUBINUR NEGATIVE  KETONESUR NEGATIVE  PROTEINUR NEGATIVE  UROBILINOGEN 0.2  NITRITE NEGATIVE  LEUKOCYTESUR NEGATIVE    Imaging results:  Dg Chest 2 View  03/19/2014   CLINICAL DATA:  Emphysema.  Worsening shortness of breath.  Cough.  EXAM: CHEST  2 VIEW  COMPARISON:  11/13/2013  FINDINGS: Heart size remains within normal limits. Severe emphysema again demonstrated. No evidence of acute infiltrate or pulmonary edema. No evidence of pleural effusion. No hilar or mediastinal masses identified.  IMPRESSION: Severe emphysema.  No active disease.   Electronically Signed   By: Earle Gell M.D.   On: 03/19/2014 16:42    Assessment & Plan by Problem:  Symptomatic anemia: Hb 5, down from 10 back at last hospitalization. Unsure of the source of her anemia. Has reported her father had myelofibrosis at prior admission. -Check haptoglobin, LDH, retic count, FOBT, smear -Order Type & Cross & give pRBC x 2 with goal of >7 -Monitor on telemetry -Give Fe 325mg  TID & Colace 100mg  BID   Severe COPD: 3L on home O2. No PFTs on file. Last assessed by Dr. Gwenette Greet [Pulmonology] on 04/25/13 and referred for pulmonary rehab though could not afford it per her insurance. -Continue albuterol nebs q4h  -Continue Dulera 2 puffs BID -Continue Claritin  Chronic pain: Reports she is on oxycodone 15mg  q6h prn for pain though reduced dose may be advised given her weight to which she is not agreeable  despite discussing the risks with her. -Continue for now though will need to confirm with her pain clinic tomorrow -Continue home Cymbalta  Bipolar disorder: Per chart review, she has had a history of auditory hallucinations which have instructed her to harm herself and avoid contact with healthcare as she fears being institutionalized.  -Reassess her symptoms and consider consulting psychiatry  Severe protein calorie malnutrition:  Give Ensure supplements.  Osteoporosis: Corrected Ca 7.8.  -Continue Tums 500mg  BID with meals.  #FEN:  -Diet: Regular -Na 132, continue following  #DVT prophylaxis: SCDs  #CODE STATUS: DNR/DNI -Defer to sister [name] if patients lacks decision-making capacity -Confirmed with patient on admission    Dispo: Disposition is deferred at this time, awaiting improvement of current medical problems.  The patient does have a current PCP (Charlott Rakes, MD) and does need an [facility] hospital follow-up appointment after discharge.  The patient does have transportation limitations that hinder transportation to clinic appointments.  Signed: Charlott Rakes, MD 03/20/2014, 7:36 PM

## 2014-03-21 DIAGNOSIS — F319 Bipolar disorder, unspecified: Secondary | ICD-10-CM | POA: Diagnosis not present

## 2014-03-21 DIAGNOSIS — M1990 Unspecified osteoarthritis, unspecified site: Secondary | ICD-10-CM | POA: Diagnosis not present

## 2014-03-21 DIAGNOSIS — F17210 Nicotine dependence, cigarettes, uncomplicated: Secondary | ICD-10-CM | POA: Diagnosis not present

## 2014-03-21 DIAGNOSIS — E43 Unspecified severe protein-calorie malnutrition: Secondary | ICD-10-CM

## 2014-03-21 DIAGNOSIS — M810 Age-related osteoporosis without current pathological fracture: Secondary | ICD-10-CM

## 2014-03-21 DIAGNOSIS — Z681 Body mass index (BMI) 19 or less, adult: Secondary | ICD-10-CM | POA: Diagnosis not present

## 2014-03-21 DIAGNOSIS — E0590 Thyrotoxicosis, unspecified without thyrotoxic crisis or storm: Secondary | ICD-10-CM | POA: Diagnosis not present

## 2014-03-21 DIAGNOSIS — Z66 Do not resuscitate: Secondary | ICD-10-CM | POA: Diagnosis not present

## 2014-03-21 DIAGNOSIS — D509 Iron deficiency anemia, unspecified: Secondary | ICD-10-CM | POA: Diagnosis not present

## 2014-03-21 DIAGNOSIS — J449 Chronic obstructive pulmonary disease, unspecified: Secondary | ICD-10-CM | POA: Diagnosis not present

## 2014-03-21 DIAGNOSIS — F3132 Bipolar disorder, current episode depressed, moderate: Secondary | ICD-10-CM | POA: Insufficient documentation

## 2014-03-21 DIAGNOSIS — D649 Anemia, unspecified: Secondary | ICD-10-CM

## 2014-03-21 DIAGNOSIS — F3130 Bipolar disorder, current episode depressed, mild or moderate severity, unspecified: Secondary | ICD-10-CM

## 2014-03-21 DIAGNOSIS — F419 Anxiety disorder, unspecified: Secondary | ICD-10-CM | POA: Diagnosis not present

## 2014-03-21 DIAGNOSIS — Z9981 Dependence on supplemental oxygen: Secondary | ICD-10-CM | POA: Diagnosis not present

## 2014-03-21 DIAGNOSIS — G894 Chronic pain syndrome: Secondary | ICD-10-CM | POA: Diagnosis not present

## 2014-03-21 DIAGNOSIS — G8929 Other chronic pain: Secondary | ICD-10-CM

## 2014-03-21 LAB — CBC
HCT: 27.3 % — ABNORMAL LOW (ref 36.0–46.0)
HEMATOCRIT: 28.1 % — AB (ref 36.0–46.0)
HEMOGLOBIN: 8.6 g/dL — AB (ref 12.0–15.0)
Hemoglobin: 8.7 g/dL — ABNORMAL LOW (ref 12.0–15.0)
MCH: 24.9 pg — ABNORMAL LOW (ref 26.0–34.0)
MCH: 26.2 pg (ref 26.0–34.0)
MCHC: 30.6 g/dL (ref 30.0–36.0)
MCHC: 31.9 g/dL (ref 30.0–36.0)
MCV: 81.4 fL (ref 78.0–100.0)
MCV: 82.2 fL (ref 78.0–100.0)
PLATELETS: 608 10*3/uL — AB (ref 150–400)
Platelets: 611 10*3/uL — ABNORMAL HIGH (ref 150–400)
RBC: 3.45 MIL/uL — ABNORMAL LOW (ref 3.87–5.11)
RBC: 3.32 MIL/uL — AB (ref 3.87–5.11)
RDW: 16.3 % — AB (ref 11.5–15.5)
RDW: 16.3 % — ABNORMAL HIGH (ref 11.5–15.5)
WBC: 6 10*3/uL (ref 4.0–10.5)
WBC: 8.6 10*3/uL (ref 4.0–10.5)

## 2014-03-21 LAB — OCCULT BLOOD X 1 CARD TO LAB, STOOL: FECAL OCCULT BLD: NEGATIVE

## 2014-03-21 LAB — CBC WITH DIFFERENTIAL/PLATELET
BASOS PCT: 0 % (ref 0–1)
Basophils Absolute: 0 10*3/uL (ref 0.0–0.1)
EOS ABS: 0 10*3/uL (ref 0.0–0.7)
Eosinophils Relative: 0 % (ref 0–5)
HCT: 18.8 % — ABNORMAL LOW (ref 36.0–46.0)
Hemoglobin: 5.2 g/dL — CL (ref 12.0–15.0)
Lymphocytes Relative: 12 % (ref 12–46)
Lymphs Abs: 1.3 10*3/uL (ref 0.7–4.0)
MCH: 22.2 pg — AB (ref 26.0–34.0)
MCHC: 27.7 g/dL — ABNORMAL LOW (ref 30.0–36.0)
MCV: 80.3 fL (ref 78.0–100.0)
MPV: 8.3 fL — ABNORMAL LOW (ref 8.6–12.4)
Monocytes Absolute: 0.7 10*3/uL (ref 0.1–1.0)
Monocytes Relative: 6 % (ref 3–12)
NEUTROS ABS: 9 10*3/uL — AB (ref 1.7–7.7)
Neutrophils Relative %: 82 % — ABNORMAL HIGH (ref 43–77)
PLATELETS: 870 10*3/uL — AB (ref 150–400)
RBC: 2.34 MIL/uL — AB (ref 3.87–5.11)
RDW: 17.4 % — AB (ref 11.5–15.5)
WBC: 11 10*3/uL — AB (ref 4.0–10.5)

## 2014-03-21 LAB — COMPREHENSIVE METABOLIC PANEL
ALT: 10 U/L (ref 0–35)
ANION GAP: 7 (ref 5–15)
AST: 16 U/L (ref 0–37)
Albumin: 2.9 g/dL — ABNORMAL LOW (ref 3.5–5.2)
Alkaline Phosphatase: 93 U/L (ref 39–117)
BUN: 8 mg/dL (ref 6–23)
CALCIUM: 7.4 mg/dL — AB (ref 8.4–10.5)
CO2: 29 mmol/L (ref 19–32)
Chloride: 103 mEq/L (ref 96–112)
Creatinine, Ser: 0.4 mg/dL — ABNORMAL LOW (ref 0.50–1.10)
GFR calc Af Amer: >90 mL/min (ref >90)
Glucose, Bld: 103 mg/dL — ABNORMAL HIGH (ref 70–99)
Potassium: 3.4 mmol/L — ABNORMAL LOW (ref 3.5–5.1)
SODIUM: 139 mmol/L (ref 135–145)
Total Bilirubin: 0.2 mg/dL — ABNORMAL LOW (ref 0.3–1.2)
Total Protein: 5.7 g/dL — ABNORMAL LOW (ref 6.0–8.3)

## 2014-03-21 LAB — HAPTOGLOBIN: Haptoglobin: 383 mg/dL — ABNORMAL HIGH (ref 43–212)

## 2014-03-21 MED ORDER — POTASSIUM CHLORIDE CRYS ER 20 MEQ PO TBCR
40.0000 meq | EXTENDED_RELEASE_TABLET | Freq: Two times a day (BID) | ORAL | Status: AC
  Administered 2014-03-21 (×2): 40 meq via ORAL
  Filled 2014-03-21 (×2): qty 2

## 2014-03-21 MED ORDER — ENSURE COMPLETE PO LIQD
237.0000 mL | Freq: Three times a day (TID) | ORAL | Status: DC
  Administered 2014-03-21 – 2014-03-22 (×3): 237 mL via ORAL

## 2014-03-21 MED ORDER — DULOXETINE HCL 30 MG PO CPEP
30.0000 mg | ORAL_CAPSULE | Freq: Two times a day (BID) | ORAL | Status: DC
  Administered 2014-03-21 – 2014-03-22 (×3): 30 mg via ORAL
  Filled 2014-03-21 (×4): qty 1

## 2014-03-21 MED ORDER — ZOLPIDEM TARTRATE 5 MG PO TABS
5.0000 mg | ORAL_TABLET | Freq: Every evening | ORAL | Status: DC | PRN
  Administered 2014-03-21: 5 mg via ORAL
  Filled 2014-03-21: qty 1

## 2014-03-21 MED ORDER — GABAPENTIN 100 MG PO CAPS
100.0000 mg | ORAL_CAPSULE | Freq: Two times a day (BID) | ORAL | Status: DC
  Administered 2014-03-21 – 2014-03-22 (×3): 100 mg via ORAL
  Filled 2014-03-21 (×4): qty 1

## 2014-03-21 MED ORDER — ALBUTEROL SULFATE (2.5 MG/3ML) 0.083% IN NEBU
2.5000 mg | INHALATION_SOLUTION | Freq: Three times a day (TID) | RESPIRATORY_TRACT | Status: DC
  Administered 2014-03-22 (×2): 2.5 mg via RESPIRATORY_TRACT
  Filled 2014-03-21 (×2): qty 3

## 2014-03-21 NOTE — Consult Note (Signed)
[facility] Consult: 3:05 PM 03/21/2014  LOS: 1 day    Referring Provider: Dr. Daryll Drown  Primary Care Physician:  Charlott Rakes, MD Primary Gastroenterologist:  Dr. Sharlett Iles    Reason for Consultation:  Heme negative stool and anemia   HPI: [name] is a 64 y.o. female.  She has endstage, oxygen dependent COPD. Significant activity limitations due to her lung disease. Has bipolar disorder. She takes narcotics for chronic back pain. Has severe protein calorie malnutrition.  She had abnormal LFTs in 2013, hepatitis serologies were negative. On ultrasound she had a small right hepatic cyst. No EGD or colonoscopies have been performed because of her lung disease. Dr. Sharlett Iles had seen her in September 2014 and felt that hepatic congestion may be the source for her abnormal liver enzymes. Hemoglobin then was around 11. She was complaining of nausea and had had some weight loss. and he trialed her with daily Nexium.   Also on her med list currently are Reglan 10 mg at bedtime.   A CT scan of the abdomen was also performed which showed 8 mm common bile duct which was described as mildly progressive compared with 2013 CT scan she also had mildly progressive intrahepatic biliary dilatation. No obstructing stones or masses were observed.  In September 2014 an MRCP showed 9 mm CBD, 6 mm right hepatic lobe cyst no parenchymal changes in the liver. No filling defects within the common bile duct nor obstructing masses. The pancreatic duct was of normal caliber.  Patient was admitted yesterday with fatigue, dyspnea, chest tightness, productive cough. Hemoglobin measured 5.2 on January 6. It was repeated the next day when it was 5.3. She was admitted to the hospital and transfused with blood. Today her hemoglobin is 8.6.  Her  last previous hemoglobin was 10.1 in September 2015. Interestingly her platelets are in the 600s to 800s which is not a new problem. Her BUN is normal.  FOBT testing today reveals heme-negative stool. On chest x-ray she has severe emphysema.  In reviewing her GI symptoms the only thing she has is her chronic low-grade nausea. She will occasionally vomit undigested material but not frequently. Her stools are formed, brown and she never sees blood or melena. After meals she feels bloated but she doesn't have abdominal pain. Her appetite is generally not good. She doesn't eat a lot. She thinks she's lost about 5 pounds within the last 4-6 weeks.  Besides her 4 times daily oxycodone, she will occasionally use over-the-counter Tylenol preparation for her pain.   Past Medical History  Diagnosis Date  . Depression   . Suicidal ideation 2007     attempted overdose 2012  Dr. Tivis Ringer report  . Bipolar disorder   . Substance abuse      narcotics, alcohol, tobacco  . Chronic pain syndrome     follows [facility]  . DEFICIENCY, VITAMIN D NOS 01/03/2007  . Chronic lower back pain   . Anxiety   . OSTEOPOROSIS 06/17/2009    DEXA 05/2009 : L femur -2.9; R femur -  2.5. Alendronate on med list but not taking. Needs addressed ASAP as h/o fractures.    . Elevated liver function tests   . On home oxygen therapy     "3L; 24/7" (03/20/2014)  . Shortness of breath     "all the time" (02/27/2013)  . COPD (chronic obstructive pulmonary disease)     oxygen dependent  . Sciatic pain   . Anemia   . Pneumonia X 1?  . Hyperthyroidism     "borderline"  . History of blood transfusion     "getting my 1st later tonight" (03/20/2014)  . Arthritis     "legs, back; hips, primarily left hip" (03/20/2014)    Past Surgical History  Procedure Laterality Date  . Orif ankle fracture Left 10/2010  . Cesarean section  1974; 1979  . Tubal ligation  1979  . Augmentation mammaplasty  ~ 2007  . Fracture surgery    .  Ankle debridement Left 10/2010    Prior to Admission medications   Medication Sig Start Date End Date Taking? Authorizing Provider  albuterol (PROVENTIL) (2.5 MG/3ML) 0.083% nebulizer solution Take 3 mLs (2.5 mg total) by nebulization every 4 (four) hours as needed. Shortness of breath 02/28/13  Yes Pollie Friar, MD  AMBULATORY NON FORMULARY MEDICATION Continuous O2 @@ 3LPM   Yes Historical Provider, MD  cholecalciferol (VITAMIN D) 1000 UNITS tablet Take 1,000 Units by mouth daily. 06/09/11  Yes Ansel Bong, MD  docusate sodium (COLACE) 100 MG capsule Take 100 mg by mouth 2 (two) times daily as needed for mild constipation.   Yes Historical Provider, MD  DULoxetine (CYMBALTA) 30 MG capsule Take 30 mg by mouth 2 (two) times daily.    Yes Historical Provider, MD  fexofenadine (ALLEGRA) 180 MG tablet Take 180 mg by mouth daily.   Yes Historical Provider, MD  Fluticasone-Salmeterol (ADVAIR DISKUS) 250-50 MCG/DOSE AEPB INHALE 1 PUFF INTO THE LUNGS 2 TIMES DAILY 03/19/14  Yes Jones Bales, MD  metoCLOPramide (REGLAN) 10 MG tablet Take 1 tablet (10 mg total) by mouth at bedtime. 01/01/13  Yes Sable Feil, MD  oxyCODONE (ROXICODONE) 15 MG immediate release tablet Take 15 mg by mouth every 6 (six) hours as needed for pain.   Yes Historical Provider, MD  PROAIR HFA 108 (90 BASE) MCG/ACT inhaler INHALE ONE PUFF BY MOUTH EVERY 2 TO 4 HOURS AS NEEDED FOR WHEEZING 01/03/14  Yes Charlott Rakes, MD  Zolpidem Tartrate (AMBIEN PO) Take 1 tablet by mouth at bedtime.   Yes Historical Provider, MD  calcium carbonate (OS-CAL) 600 MG TABS tablet Take 600 mg by mouth 2 (two) times daily with a meal.    Historical Provider, MD    Scheduled Meds: . albuterol  2.5 mg Nebulization Q4H  . antiseptic oral rinse  7 mL Mouth Rinse BID  . calcium carbonate  1 tablet Oral BID WC  . DULoxetine  30 mg Oral BID  . feeding supplement (ENSURE COMPLETE)  237 mL Oral TID BM  . ferrous sulfate  325 mg Oral TID WC  .  gabapentin  100 mg Oral BID  . loratadine  10 mg Oral Daily  . mometasone-formoterol  2 puff Inhalation BID  . potassium chloride  40 mEq Oral BID  . sodium chloride  3 mL Intravenous Q12H   Infusions:   PRN Meds: sodium chloride, acetaminophen **OR** acetaminophen, albuterol, docusate sodium, ondansetron **OR** ondansetron (ZOFRAN) IV, oxyCODONE, sodium chloride   Allergies as of 03/20/2014 - Review Complete 03/20/2014  Allergen Reaction Noted  . Banana Shortness Of Breath and Nausea And Vomiting 01/10/2012  . Pollen extract Other (See Comments) 11/20/2012    Family History  Problem Relation Age of Onset  . Stroke Neg Hx   . Cancer Neg Hx   . Myelodysplastic syndrome Father     Died from myelofibrosis though diagnosed post-mortem    History   Social History  . Marital Status: Divorced    Spouse Name: N/A    Number of Children: 2  . Years of Education: N/A   Occupational History  . retired    Social History Main Topics  . Smoking status: Current Every Day Smoker -- 0.20 packs/day for 45 years    Types: Cigarettes  . Smokeless tobacco: Never Used     Comment: 03/20/2014 "smoking 2 cigarettes/day; that's down from 2 ppd"  . Alcohol Use: Yes     Comment: 03/20/2014 "nothing in 7 years; never had a problem w/it"  . Drug Use: No  . Sexual Activity: No   Other Topics Concern  . Not on file   Social History Narrative    REVIEW OF SYSTEMS: Constitutional:  Generally does not have much energy. ENT:  No nose bleeds Pulm:  Still feeling dyspnea above her norm despite the transfusions. CV:  No palpitations, no LE edema.  GU:  No hematuria, no frequency GI:  No dysphagia. Heme:  Per history of present illness   Transfusions:  Per history of present illness.  None in the past Neuro:  No headaches, no peripheral tingling or numbness Derm:  No itching, no rash or sores.  Endocrine:  No sweats or chills.  No polyuria or dysuria Immunization:  Reviewed. She is up-to-date on  flu and pneumococcal vaccines Travel:  None beyond local counties in last few months.    PHYSICAL EXAM: Vital signs in last 24 hours: Filed Vitals:   03/21/14 1339  BP: 130/57  Pulse:   Temp: 98.8 F (37.1 C)  Resp: 18   Wt Readings from Last 3 Encounters:  03/20/14 86 lb 9.6 oz (39.282 kg)  11/11/13 86 lb 11.2 oz (39.327 kg)  05/14/13 84 lb (38.102 kg)   General: Pale, pleasant, comfortable older white female who looks chronically ill and somewhat cachectic. She is obviously intelligent and answers questions appropriately and insight fully. Head:  No facial asymmetry or swelling. No signs of trauma  Eyes:  No scleral icterus, no conjunctival pallor. Ears:  No hearing deficit  Nose:  No nasal congestion or discharge Mouth:  Clear, moist oral mucosa Neck:  No JVD, no masses, no TMG. Lungs:  Breath sounds are distant. No adventitious sounds. No dyspnea or cough. Oxygen per nasal cannula is in place. Heart: RRR. No MRG. S1/S2 audible. Abdomen:  Somewhat protuberant in the lower belly but very soft, no tenderness at all. No HSM.Marland Kitchen   Rectal: Deferred   Musc/Skeltl: Some spinal kyphosis.  No joint erythema or swelling. Extremities:  Overall her limbs are thin. There is no pedal edema. Feet are warm  Neurologic:  Patient is alert and oriented 3. Answers all questions clearly. Moves all 4 limbs. No tremor Skin:  No telangiectasia, rash or sores Tattoos:  None Nodes:  No cervical adenopathy.   Psych:  Pleasant, not agitated, appropriate and cooperative.  Intake/Output from previous day: 01/07 0701 - 01/08 0700 In: 1038 [P.O.:358; I.V.:10; Blood:670] Out: 1050 [Urine:1050] Intake/Output this shift: Total I/O In: 240 [P.O.:240] Out: 350 [Urine:350]  LAB RESULTS:  Recent Labs  03/19/14 1648 03/20/14 1530 03/21/14 0545  WBC 11.0* 10.1 6.0  HGB 5.2* 5.3* 8.6*  HCT 18.8* 18.5* 28.1*  PLT 870* 844* 611*   MCV                                         81  BMET Lab Results    Component Value Date   NA 139 03/21/2014   NA 132* 03/19/2014   NA 140 11/13/2013   K 3.4* 03/21/2014   K 4.5 03/19/2014   K 3.8 11/13/2013   CL 103 03/21/2014   CL 95* 03/19/2014   CL 94* 11/13/2013   CO2 29 03/21/2014   CO2 26 03/19/2014   CO2 33* 11/13/2013   GLUCOSE 103* 03/21/2014   GLUCOSE 76 03/19/2014   GLUCOSE 118* 11/13/2013   BUN 8 03/21/2014   BUN 19 03/19/2014   BUN 7 11/13/2013   CREATININE 0.40* 03/21/2014   CREATININE 0.48* 03/19/2014   CREATININE 0.33* 11/13/2013   CALCIUM 7.4* 03/21/2014   CALCIUM 7.6* 03/19/2014   CALCIUM 8.3* 11/13/2013   LFT  Recent Labs  03/19/14 1648 03/21/14 0545  PROT 6.2 5.7*  ALBUMIN 3.8 2.9*  AST 15 16  ALT 9 10  ALKPHOS 101 93  BILITOT 0.1* 0.2*   PT/INR Lab Results  Component Value Date   INR 1.1* 11/30/2012   INR 1.47 11/16/2010   INR 1.24 11/15/2010   Hepatitis Panel No results for input(s): HEPBSAG, HCVAB, HEPAIGM, HEPBIGM in the last 72 hours. C-Diff No components found for: CDIFF Lipase     Component Value Date/Time   LIPASE 18.0 11/30/2012 1303    Drugs of Abuse     Component Value Date/Time   LABOPIA NONE DETECTED 01/10/2012 1339   COCAINSCRNUR NONE DETECTED 01/10/2012 1339   LABBENZ NONE DETECTED 01/10/2012 1339   AMPHETMU NONE DETECTED 01/10/2012 1339   THCU NONE DETECTED 01/10/2012 1339   LABBARB NONE DETECTED 01/10/2012 1339     RADIOLOGY STUDIES: Dg Chest 2 View  03/19/2014   CLINICAL DATA:  Emphysema.  Worsening shortness of breath.  Cough.  EXAM: CHEST  2 VIEW  COMPARISON:  11/13/2013  FINDINGS: Heart size remains within normal limits. Severe emphysema again demonstrated. No evidence of acute infiltrate or pulmonary edema. No evidence of pleural effusion. No hilar or mediastinal masses identified.  IMPRESSION: Severe emphysema.  No active disease.   Electronically Signed   By: Earle Gell M.D.   On: 03/19/2014 16:42    ENDOSCOPIC STUDIES: None ever  IMPRESSION:   *  Anemia,  normocytic. Iron, iron saturation and ferritin at 10, are all low consistent with iron deficiency. Status post transfusion of 2 PRBCs  *  Advanced, oxygen dependent COPD/emphysema. His has hindered endoscopic evaluation in previous years.  *  2014 workup for abnormal LFTs as per history of present illness.  *  Chronic nausea, multifactorial but certainly her narcotics are a great contributor.    PLAN:     *  Patient is not willing to undergo any type of endoscopic procedure. Given her overall poor pulmonary status this is a totally reasonable decision on her part. Would monitor hemoglobin regularly and transfuse as needed in the future. Would not continue to Hemoccult stools as this is not going to make any difference in her decision.  *  It will not be necessary for her to follow-up with GI.  Azucena Freed  03/21/2014, 3:05 PM Pager: [phone]       Attending physician's note   I have taken a history, examined the patient and reviewed the chart. I agree with the Advanced Practitioner's note, impression and recommendations. Fe deficiency anemia with heme negative stool. Hb improved from 5.2 to 8.6 with tranfusions. Advanced, oxygen dependent COPD. She would be at a much high risk for cardiopulmonary complications with sedation and endoscopic procedures. She is against having endoscopic procedures and this is certainly a reasonable decision given the risks. Recommend IV Fe replacement in hospital. Outpatient follow up with her PCP. No GI follow up is necessary. GI signing off.    Ladene Artist, MD Marval Regal

## 2014-03-21 NOTE — Progress Notes (Signed)
INITIAL NUTRITION ASSESSMENT  DOCUMENTATION CODES Per approved criteria  -Severe malnutrition in the context of chronic illness -Underweight   INTERVENTION: Ensure Complete po TID, each supplement provides 350 kcal and 13 grams of protein   NUTRITION DIAGNOSIS: Malnutrition related to chronic illness as evidenced by severe fat and muscle depletion.   Goal: Pt to meet >/= 90% of their estimated nutrition needs   Monitor:  Weight trends, PO intake, supplement acceptance  Reason for Assessment: Pt identified as at nutrition risk on the Malnutrition Screen Tool  64 y.o. female  Admitting Dx: Symptomatic anemia  ASSESSMENT: Pt admitted with symptomatic anemia, work up in progress. Pt with hx of severe COPD on 3 L of O2 at home.   Pt lives at home alone in independent living where her lunch is provided to her M-F. She has a good appetite and eats well at home. Breakfast: oatmeal with brown sugar and butter Lunch: Full meal in cafeteria Dinner: beef stew, chili with beans, oatmeal, stoffers, etc. Pt is unable to stand up for very long and cook so she relies on convenience items.  Pt likes ensure and drinks a couple of these daily.  Nutrition Focused Physical Exam:  Subcutaneous Fat:  Orbital Region: severe depletion Upper Arm Region: severe depletion Thoracic and Lumbar Region: severe depletion  Muscle:  Temple Region: severe depletion Clavicle Bone Region: severe depletion Clavicle and Acromion Bone Region: severe depletion Scapular Bone Region: severe depletion Dorsal Hand: severe depletion Patellar Region: severe depletion Anterior Thigh Region: severe depletion Posterior Calf Region: severe depletion  Edema: not present   Height: Ht Readings from Last 1 Encounters:  03/20/14 5\' 1"  (1.549 m)    Weight: Wt Readings from Last 1 Encounters:  03/20/14 86 lb 9.6 oz (39.282 kg)    Ideal Body Weight: 105 lb   % Ideal Body Weight: 81%  Wt Readings from Last  10 Encounters:  03/20/14 86 lb 9.6 oz (39.282 kg)  11/11/13 86 lb 11.2 oz (39.327 kg)  05/14/13 84 lb (38.102 kg)  04/25/13 85 lb (38.556 kg)  04/17/13 83 lb 8 oz (37.875 kg)  03/28/13 83 lb 12.8 oz (38.011 kg)  03/19/13 81 lb 12.8 oz (37.104 kg)  03/06/13 80 lb 3.2 oz (36.378 kg)  02/26/13 78 lb 3.2 oz (35.471 kg)  01/01/13 79 lb (35.834 kg)    Usual Body Weight: 86 lb   % Usual Body Weight: 100%  BMI:  Body mass index is 16.37 kg/(m^2).  Estimated Nutritional Needs: Kcal: 1300-1500 Protein: 60-70 grams Fluid: > 1.5 L/day  Skin: intact  Diet Order: Diet regular Meal Completion: 100%  EDUCATION NEEDS: -No education needs identified at this time   Intake/Output Summary (Last 24 hours) at 03/21/14 1422 Last data filed at 03/21/14 1248  Gross per 24 hour  Intake   1278 ml  Output   1400 ml  Net   -122 ml    Last BM: 1/7   Labs:   Recent Labs Lab 03/19/14 1648 03/21/14 0545  NA 132* 139  K 4.5 3.4*  CL 95* 103  CO2 26 29  BUN 19 8  CREATININE 0.48* 0.40*  CALCIUM 7.6* 7.4*  GLUCOSE 76 103*    CBG (last 3)  No results for input(s): GLUCAP in the last 72 hours.  Scheduled Meds: . albuterol  2.5 mg Nebulization Q4H  . antiseptic oral rinse  7 mL Mouth Rinse BID  . calcium carbonate  1 tablet Oral BID WC  . DULoxetine  30 mg Oral BID  . feeding supplement (ENSURE COMPLETE)  237 mL Oral BID BM  . ferrous sulfate  325 mg Oral TID WC  . gabapentin  100 mg Oral BID  . loratadine  10 mg Oral Daily  . mometasone-formoterol  2 puff Inhalation BID  . potassium chloride  40 mEq Oral BID  . sodium chloride  3 mL Intravenous Q12H    Continuous Infusions:   Past Medical History  Diagnosis Date  . Depression   . Suicidal ideation 2007     attempted overdose 2012  Dr. Tivis Ringer report  . Bipolar disorder   . Substance abuse      narcotics, alcohol, tobacco  . Chronic pain syndrome     follows [facility]  . DEFICIENCY, VITAMIN D NOS  01/03/2007  . Chronic lower back pain   . Anxiety   . OSTEOPOROSIS 06/17/2009    DEXA 05/2009 : L femur -2.9; R femur -2.5. Alendronate on med list but not taking. Needs addressed ASAP as h/o fractures.    . Elevated liver function tests   . On home oxygen therapy     "3L; 24/7" (03/20/2014)  . Shortness of breath     "all the time" (02/27/2013)  . COPD (chronic obstructive pulmonary disease)     oxygen dependent  . Sciatic pain   . Anemia   . Pneumonia X 1?  . Hyperthyroidism     "borderline"  . History of blood transfusion     "getting my 1st later tonight" (03/20/2014)  . Arthritis     "legs, back; hips, primarily left hip" (03/20/2014)    Past Surgical History  Procedure Laterality Date  . Orif ankle fracture Left 10/2010  . Cesarean section  1974; 1979  . Tubal ligation  1979  . Augmentation mammaplasty  ~ 2007  . Fracture surgery    . Ankle debridement Left 10/2010    [place], [place], [place] Pager [phone] After Hours Pager

## 2014-03-21 NOTE — Consult Note (Signed)
[facility] Face-to-Face Psychiatry Consult   Reason for Consult:  Medication management Referring Physician:  Dr. Sherald Barge is an 64 y.o. female. Total Time spent with patient: 45 minutes  Assessment: AXIS I:  Bipolar, Depressed AXIS II:  Deferred AXIS III:   Past Medical History  Diagnosis Date  . Depression   . Suicidal ideation 2007     attempted overdose 2012  Dr. Tivis Ringer report  . Bipolar disorder   . Substance abuse      narcotics, alcohol, tobacco  . Chronic pain syndrome     [facility]s [facility]  . DEFICIENCY, VITAMIN D NOS 01/03/2007  . Chronic lower back pain   . Anxiety   . OSTEOPOROSIS 06/17/2009    DEXA 05/2009 : L femur -2.9; R femur -2.5. Alendronate on med list but not taking. Needs addressed ASAP as h/o fractures.    . Elevated liver function tests   . On home oxygen therapy     "3L; 24/7" (03/20/2014)  . Shortness of breath     "all the time" (02/27/2013)  . COPD (chronic obstructive pulmonary disease)     oxygen dependent  . Sciatic pain   . Anemia   . Pneumonia X 1?  . Hyperthyroidism     "borderline"  . History of blood transfusion     "getting my 1st later tonight" (03/20/2014)  . Arthritis     "legs, back; hips, primarily left hip" (03/20/2014)   AXIS IV:  other psychosocial or environmental problems, problems related to social environment and problems with primary support group AXIS V:  51-60 moderate symptoms  Plan:   No evidence of imminent risk to self or others at present.   Patient does not meet criteria for psychiatric inpatient admission. Supportive therapy provided about ongoing stressors. Appreciate psychiatric consultation follow-up as clinically required while in the hospital May referred to the outpatient psychiatric services at [facility] as a disposition plan. Please contact [phone] or [phone] if needs further assistance  Subjective:   [name] is a 64 y.o. female patient admitted  with depression, SOB, and fatigue.  HPI:  [name] is a 64 year old female seen, chart reviewed for psychiatric consultation and evaluation of bipolar disorder and medication management. Patient reportedly suffering with bipolar depression and chronic pain syndrome and has been receiving outpatient psychiatric services from [facility] behavioral health in [place]. Reportedly patient received Cymbalta 30 mg total twice a day and unable to provide symptoms of mania or treatment of mood stabilizers. Patient feels uncomfortable being hospitalized, anxious about her unknown blood loss. Patient denies current symptoms of depression, anxiety, mania, psychosis and has denied current suicidal or homicidal ideation, intention or plan. Patient has no evidence of psychosis. Patient is willing to be compliant with her medication management.  Reportedly patient lives by herself in an apartment and has brother in place and guarded and sister in [place], [place] and one son and daughter out of state. Patient reportedly taken an early retirement secondary to being disabled for sciatica which she has been suffering for the last 8 years.  Medical history: Patient with bipolar disorder, COPD (2L O2), Fe deficiency anemia, osteoporosis, chronic pain, severe protein-calorie malnutrition who presented with fatigue and shortness of breath. Yesterday, she was seen by Dr. Gordy Levan in clinic and reported worsening fatigue, dyspnea, productive cough, chest tightness though was not in any respiratory distress. CXR along with CBC/CMET were ordered, and she was sent  out with symptomatic treatment for presumed viral URI. Hb was initially found to be 7.1, and she was called back for repeat testing; Hb was found to be 5.3, and it was decided she would need to be admitted. She denies any signs of bleeding: hemoptysis, hematochezia, melena, vaginal bleeding. She was last hospitalized 11/08/13-11/11/13 for acute on chronic respiratory  failure in the setting of CAP and found to have Fe deficiency [Fe 14, ferritin 10]. Though she was discharged on Fe tablets, she reports not knowing that she was supposed to take this medicine.  HPI Elements:   Location:  Depression and anxiety. Quality:  Increased anxiety. Severity:  Moderate. Timing:  Recent hospitalization. Duration:  Few days. Context:  Unknown psychosocial stressors..  Past Psychiatric History: Past Medical History  Diagnosis Date  . Depression   . Suicidal ideation 2007     attempted overdose 2012  Dr. Tivis Ringer report  . Bipolar disorder   . Substance abuse      narcotics, alcohol, tobacco  . Chronic pain syndrome     [facility]s [facility]  . DEFICIENCY, VITAMIN D NOS 01/03/2007  . Chronic lower back pain   . Anxiety   . OSTEOPOROSIS 06/17/2009    DEXA 05/2009 : L femur -2.9; R femur -2.5. Alendronate on med list but not taking. Needs addressed ASAP as h/o fractures.    . Elevated liver function tests   . On home oxygen therapy     "3L; 24/7" (03/20/2014)  . Shortness of breath     "all the time" (02/27/2013)  . COPD (chronic obstructive pulmonary disease)     oxygen dependent  . Sciatic pain   . Anemia   . Pneumonia X 1?  . Hyperthyroidism     "borderline"  . History of blood transfusion     "getting my 1st later tonight" (03/20/2014)  . Arthritis     "legs, back; hips, primarily left hip" (03/20/2014)    reports that she has been smoking Cigarettes.  She has a 9 pack-year smoking history. She has never used smokeless tobacco. She reports that she drinks alcohol. She reports that she does not use illicit drugs. Family History  Problem Relation Age of Onset  . Stroke Neg Hx   . Cancer Neg Hx   . Myelodysplastic syndrome Father     Died from myelofibrosis though diagnosed post-mortem     Living Arrangements: Alone   Abuse/Neglect [facility]) Physical Abuse: Denies Verbal Abuse: Denies Sexual Abuse: Denies Allergies:   Allergies  Allergen  Reactions  . Banana Shortness Of Breath and Nausea And Vomiting  . Pollen Extract Other (See Comments)    Seasonal     ACT Assessment Complete:  NO  Objective: Blood pressure 125/60, pulse 75, temperature 99.3 F (37.4 C), temperature source Oral, resp. rate 18, height 5' 1"  (1.549 m), weight 39.282 kg (86 lb 9.6 oz), SpO2 100 %.Body mass index is 16.37 kg/(m^2). Results for orders placed or performed during the hospital encounter of 03/20/14 (from the past 72 hour(s))  Prepare RBC     Status: None   Collection Time: 03/20/14  6:00 PM  Result Value Ref Range   Order Confirmation ORDER PROCESSED BY BLOOD BANK   Urinalysis, Routine w reflex microscopic     Status: None   Collection Time: 03/20/14  6:38 PM  Result Value Ref Range   Color, Urine YELLOW YELLOW   APPearance CLEAR CLEAR   Specific Gravity, Urine 1.016 1.005 - 1.030  pH 5.0 5.0 - 8.0   Glucose, UA NEGATIVE NEGATIVE mg/dL   Hgb urine dipstick NEGATIVE NEGATIVE   Bilirubin Urine NEGATIVE NEGATIVE   Ketones, ur NEGATIVE NEGATIVE mg/dL   Protein, ur NEGATIVE NEGATIVE mg/dL   Urobilinogen, UA 0.2 0.0 - 1.0 mg/dL   Nitrite NEGATIVE NEGATIVE   Leukocytes, UA NEGATIVE NEGATIVE    Comment: MICROSCOPIC NOT DONE ON URINES WITH NEGATIVE PROTEIN, BLOOD, LEUKOCYTES, NITRITE, OR GLUCOSE <1000 mg/dL.  Type and screen     Status: None (Preliminary result)   Collection Time: 03/20/14  7:22 PM  Result Value Ref Range   ABO/RH(D) O POS    Antibody Screen NEG    Sample Expiration 03/23/2014    Unit Number [id number]    Blood Component Type RED CELLS,LR    Unit division 00    Status of Unit ISSUED    Transfusion Status OK TO TRANSFUSE    Crossmatch Result Compatible    Unit Number [id number]    Blood Component Type RED CELLS,LR    Unit division 00    Status of Unit ISSUED    Transfusion Status OK TO TRANSFUSE    Crossmatch Result Compatible   Reticulocytes     Status: Abnormal   Collection Time: 03/20/14  7:22 PM  Result  Value Ref Range   Retic Ct Pct 3.5 (H) 0.4 - 3.1 %   RBC. 2.44 (L) 3.87 - 5.11 MIL/uL   Retic Count, Manual 85.4 19.0 - 186.0 K/uL  Lactate dehydrogenase     Status: None   Collection Time: 03/20/14  7:22 PM  Result Value Ref Range   LDH 178 94 - 250 U/L  Haptoglobin     Status: Abnormal   Collection Time: 03/20/14  7:22 PM  Result Value Ref Range   Haptoglobin 383 (H) 43 - 212 mg/dL    Comment: ** Please note change in reference range(s). ** Performed at [facility]   ABO/Rh     Status: None   Collection Time: 03/20/14  7:22 PM  Result Value Ref Range   ABO/RH(D) O POS   Comprehensive metabolic panel     Status: Abnormal   Collection Time: 03/21/14  5:45 AM  Result Value Ref Range   Sodium 139 135 - 145 mmol/L    Comment: Please note change in reference range.   Potassium 3.4 (L) 3.5 - 5.1 mmol/L    Comment: Please note change in reference range.   Chloride 103 96 - 112 mEq/L   CO2 29 19 - 32 mmol/L   Glucose, Bld 103 (H) 70 - 99 mg/dL   BUN 8 6 - 23 mg/dL   Creatinine, Ser 0.40 (L) 0.50 - 1.10 mg/dL   Calcium 7.4 (L) 8.4 - 10.5 mg/dL   Total Protein 5.7 (L) 6.0 - 8.3 g/dL   Albumin 2.9 (L) 3.5 - 5.2 g/dL   AST 16 0 - 37 U/L   ALT 10 0 - 35 U/L   Alkaline Phosphatase 93 39 - 117 U/L   Total Bilirubin 0.2 (L) 0.3 - 1.2 mg/dL   GFR calc non Af Amer >90 >90 mL/min   GFR calc Af Amer >90 >90 mL/min    Comment: (NOTE) The eGFR has been calculated using the CKD EPI equation. This calculation has not been validated in all clinical situations. eGFR's persistently <90 mL/min signify possible Chronic Kidney Disease.    Anion gap 7 5 - 15  CBC     Status: Abnormal   Collection  Time: 03/21/14  5:45 AM  Result Value Ref Range   WBC 6.0 4.0 - 10.5 K/uL   RBC 3.45 (L) 3.87 - 5.11 MIL/uL   Hemoglobin 8.6 (L) 12.0 - 15.0 g/dL    Comment: REPEATED TO VERIFY DELTA CHECK NOTED    HCT 28.1 (L) 36.0 - 46.0 %   MCV 81.4 78.0 - 100.0 fL   MCH 24.9 (L) 26.0 - 34.0 pg    MCHC 30.6 30.0 - 36.0 g/dL   RDW 16.3 (H) 11.5 - 15.5 %   Platelets 611 (H) 150 - 400 K/uL    Comment: REPEATED TO VERIFY DELTA CHECK NOTED   Occult blood card to lab, stool RN will collect     Status: None   Collection Time: 03/21/14  9:34 AM  Result Value Ref Range   Fecal Occult Bld NEGATIVE NEGATIVE   Labs are reviewed and are pertinent for low Hb and Hct..  Current Facility-Administered Medications  Medication Dose Route Frequency Provider Last Rate Last Dose  . 0.9 %  sodium chloride infusion  250 mL Intravenous PRN Blain Pais, MD      . acetaminophen (TYLENOL) tablet 650 mg  650 mg Oral Q6H PRN Blain Pais, MD       Or  . acetaminophen (TYLENOL) suppository 650 mg  650 mg Rectal Q6H PRN Blain Pais, MD      . albuterol (PROVENTIL) (2.5 MG/3ML) 0.083% nebulizer solution 2.5 mg  2.5 mg Nebulization Q4H Blain Pais, MD   2.5 mg at 03/21/14 0810  . albuterol (PROVENTIL) (2.5 MG/3ML) 0.083% nebulizer solution 2.5 mg  2.5 mg Inhalation Q4H PRN Blain Pais, MD      . antiseptic oral rinse (CPC / CETYLPYRIDINIUM CHLORIDE 0.05%) solution 7 mL  7 mL Mouth Rinse BID Sid Falcon, MD   7 mL at 03/21/14 1000  . calcium carbonate (OS-CAL - dosed in mg of elemental calcium) tablet 500 mg of elemental calcium  1 tablet Oral BID WC Sid Falcon, MD   500 mg of elemental calcium at 03/21/14 0805  . docusate sodium (COLACE) capsule 100 mg  100 mg Oral BID PRN Blain Pais, MD      . feeding supplement (ENSURE COMPLETE) (ENSURE COMPLETE) liquid 237 mL  237 mL Oral BID BM Sid Falcon, MD   237 mL at 03/21/14 0941  . ferrous sulfate tablet 325 mg  325 mg Oral TID WC Blain Pais, MD   325 mg at 03/21/14 0805  . loratadine (CLARITIN) tablet 10 mg  10 mg Oral Daily Blain Pais, MD   10 mg at 03/21/14 0941  . mometasone-formoterol (DULERA) 100-5 MCG/ACT inhaler 2 puff  2 puff Inhalation BID Blain Pais, MD   2 puff at 03/21/14  [phone]  . ondansetron (ZOFRAN) tablet 4 mg  4 mg Oral Q6H PRN Blain Pais, MD       Or  . ondansetron [facility]) injection 4 mg  4 mg Intravenous Q6H PRN Blain Pais, MD      . oxyCODONE (Oxy IR/ROXICODONE) immediate release tablet 15 mg  15 mg Oral Q6H PRN Blain Pais, MD   15 mg at 03/21/14 0547  . sodium chloride 0.9 % injection 3 mL  3 mL Intravenous Q12H Blain Pais, MD   3 mL at 03/21/14 0941  . sodium chloride 0.9 % injection 3 mL  3 mL Intravenous PRN Blain Pais, MD  Psychiatric Specialty Exam: Physical Exam as per history and physical  ROS depression, fatigue and SOB  Blood pressure 125/60, pulse 75, temperature 99.3 F (37.4 C), temperature source Oral, resp. rate 18, height 5' 1"  (1.549 m), weight 39.282 kg (86 lb 9.6 oz), SpO2 100 %.Body mass index is 16.37 kg/(m^2).  General Appearance: Guarded  Eye Contact::  Good  Speech:  Clear and Coherent  Volume:  Decreased  Mood:  Anxious and Depressed  Affect:  Appropriate and Congruent  Thought Process:  Coherent and Goal Directed  Orientation:  Full (Time, Place, and Person)  Thought Content:  Rumination  Suicidal Thoughts:  No  Homicidal Thoughts:  No  Memory:  Immediate;   Good Recent;   Good  Judgement:  Fair  Insight:  Fair  Psychomotor Activity:  Decreased  Concentration:  Good  Recall:  Good  Fund of Knowledge:Good  Language: Good  Akathisia:  NA  Handed:  Right  AIMS (if indicated):     Assets:  Communication Skills Desire for Improvement Financial Resources/Insurance Housing Leisure Time Resilience Social Support Talents/Skills Transportation  Sleep:      Musculoskeletal: Strength & Muscle Tone: within normal limits Gait & Station: unable to stand Patient leans: N/A  Treatment Plan Summary: Daily contact with patient to assess and evaluate symptoms and progress in treatment Medication management  Will restart Cymbalta 30 mg twice daily for depression and  start gabapentin 100 mg twice daily for neuropathic pain  Glynis Hunsucker,JANARDHAHA R. 03/21/2014 10:12 AM

## 2014-03-21 NOTE — Progress Notes (Signed)
Pt had a medium formed stool brown & black.

## 2014-03-21 NOTE — Progress Notes (Signed)
  Date: 03/21/2014  Patient name: [name]  Medical record number: [id number]  Date of birth: Aug 16, 1950   I have seen and evaluated [name] and discussed their care with the Residency Team. Briefly [name] is a 64yo woman with pMH of bipolar disorder, COPD on home O2, Fe deficiency anemia, chronic pain, malnutrition who presents with fatigue and SOB.  Associated symptoms included dyspnea, productive cough and chest tightness.  She was seen in clinic and found to have a Hgb of 7.1 but on repeat testing it was 5.3.  She denies any signs of overt bleeding.  She was most recently admitted in August of 2015 and advised to take iron supplementation, but did not start the medication. On exam, she was in no distress, she was pale with conjunctival pallor.  She is thin, cachetic, lungs were relatively clear with decreased breath sounds, abdomen soft with + BS.  Labs showed a Na of 132, Hbg as mentioned.  CXR showed severe emphysema and no active disease.   Assessment and Plan: I have seen and evaluated the patient as outlined above. I agree with the formulated Assessment and Plan as detailed in the residents' admission note, with the following changes:   1. Symptomatic iron deficiency anemia - Check haptoglobin, LDH, retic, FOBT - Transfuse 2 units prbc to start, recheck CBC after - Iron and colace - Trend CBC - Consider GI consult if FOBT +, she has not had colon cancer screening.   2. Severe COPD - At baseline, continue home medications  3. Bipolar with hallucinations - reported - Psychiatry consult  Other issues per Dr. Serita Grit H&P  Jordan Falcon, MD 1/8/20164:10 PM

## 2014-03-21 NOTE — Progress Notes (Addendum)
Subjective: This AM, she reports feeling better. Though initially she was considering going home, I explained to her the severity of her blood loss and is agreeable to rectal exam & FOBT. She reports having knee pain and remembers her father had similar issues before dying of myelofibrosis. She denies significant NSAID use, but says that she takes an OTC tylenol medication for Arthritis, [facility] brand, she can not remember the name. She denies dark stools of blood in stoools. Endorses some nausea with out vomiting or abdominal pain.   Objective: Vital signs in last 24 hours: Filed Vitals:   03/21/14 0349 03/21/14 0518 03/21/14 0812 03/21/14 1138  BP: 138/55 125/60    Pulse: 75     Temp: 99.3 F (37.4 C)     TempSrc: Oral     Resp: 18     Height:      Weight:      SpO2: 100%  100% 99%   Weight change:   Intake/Output Summary (Last 24 hours) at 03/21/14 1225 Last data filed at 03/21/14 1151  Gross per 24 hour  Intake   1278 ml  Output   1250 ml  Net     28 ml   GENERAL- alert, co-operative, appears as stated age, not in any distress. HEENT- Atraumatic, normocephalic,neck supple. CARDIAC- RRR, no murmurs, rubs or gallops. RESP- Moving equal volumes of air, and clear to auscultation bilaterally, no wheezes or crackles. ABDOMEN- Soft, nontender, bowel sounds present. EXTREMITIES-  no pedal edema. SKIN- Warm, dry, No rash or lesion.  Lab Results: Basic Metabolic Panel:  Recent Labs Lab 03/19/14 1648 03/21/14 0545  NA 132* 139  K 4.5 3.4*  CL 95* 103  CO2 26 29  GLUCOSE 76 103*  BUN 19 8  CREATININE 0.48* 0.40*  CALCIUM 7.6* 7.4*   Liver Function Tests:  Recent Labs Lab 03/19/14 1648 03/21/14 0545  AST 15 16  ALT 9 10  ALKPHOS 101 93  BILITOT 0.1* 0.2*  PROT 6.2 5.7*  ALBUMIN 3.8 2.9*   CBC:  Recent Labs Lab 03/19/14 1648 03/20/14 1530 03/21/14 0545  WBC 11.0* 10.1 6.0  NEUTROABS 9.0* 6.9  --   HGB 5.2* 5.3* 8.6*  HCT 18.8* 18.5* 28.1*  MCV 80.3 78.4  81.4  PLT 870* 844* 611*   Anemia Panel:  Recent Labs Lab 03/20/14 1922  RETICCTPCT 3.5*   Urine Drug Screen: Drugs of Abuse     Component Value Date/Time   LABOPIA NONE DETECTED 01/10/2012 1339   COCAINSCRNUR NONE DETECTED 01/10/2012 1339   LABBENZ NONE DETECTED 01/10/2012 1339   AMPHETMU NONE DETECTED 01/10/2012 1339   THCU NONE DETECTED 01/10/2012 1339   LABBARB NONE DETECTED 01/10/2012 1339    Urinalysis:  Recent Labs Lab 03/20/14 1838  COLORURINE YELLOW  LABSPEC 1.016  PHURINE 5.0  GLUCOSEU NEGATIVE  HGBUR NEGATIVE  BILIRUBINUR NEGATIVE  KETONESUR NEGATIVE  PROTEINUR NEGATIVE  UROBILINOGEN 0.2  NITRITE NEGATIVE  LEUKOCYTESUR NEGATIVE   Studies/Results: Dg Chest 2 View  03/19/2014   CLINICAL DATA:  Emphysema.  Worsening shortness of breath.  Cough.  EXAM: CHEST  2 VIEW  COMPARISON:  11/13/2013  FINDINGS: Heart size remains within normal limits. Severe emphysema again demonstrated. No evidence of acute infiltrate or pulmonary edema. No evidence of pleural effusion. No hilar or mediastinal masses identified.  IMPRESSION: Severe emphysema.  No active disease.   Electronically Signed   By: Earle Gell M.D.   On: 03/19/2014 16:42   Medications: I have reviewed the  patient's current medications. Scheduled Meds: . albuterol  2.5 mg Nebulization Q4H  . antiseptic oral rinse  7 mL Mouth Rinse BID  . calcium carbonate  1 tablet Oral BID WC  . DULoxetine  30 mg Oral BID  . feeding supplement (ENSURE COMPLETE)  237 mL Oral BID BM  . ferrous sulfate  325 mg Oral TID WC  . gabapentin  100 mg Oral BID  . loratadine  10 mg Oral Daily  . mometasone-formoterol  2 puff Inhalation BID  . sodium chloride  3 mL Intravenous Q12H   Continuous Infusions:  PRN Meds:.sodium chloride, acetaminophen **OR** acetaminophen, albuterol, docusate sodium, ondansetron **OR** ondansetron (ZOFRAN) IV, oxyCODONE, sodium chloride Assessment/Plan:  Symptomatic anemia: Hb 8 s/p pRBC x 2. Unsure of  the source of her anemia. Adjusted retic count 1.5, which is an inappropriate response though likely in the setting of severe Fe deficiency. FOBT negative. Has not had Colonoscopy/endoscopy due to severe lung disease. Pt denies obvious blood loss.  -Continue Fe 325mg  TID & Colace 100mg  BID  - Consult GI.  - CBC tonight and in the am. - Avoid NSAIDs - if stable will discharge tomorrow, to follow up in clinic closely.   Severe COPD: 3L on home O2. No PFTs on file. Last assessed by Dr. Gwenette Greet [Pulmonology] on 04/25/13 and referred for pulmonary rehab though could not afford it per her insurance. -Continue albuterol nebs q4h  -Continue Dulera 2 puffs BID -Continue Claritin  Chronic pain: Resolved. Reports she is on oxycodone 15mg  q6h prn for pain though reduced dose may be advised given her weight to which she is not agreeable despite discussing the risks with her. - Cont home Oxycodone -Continue home Cymbalta  Bipolar disorder: Per chart review, she has had a history of auditory hallucinations which have instructed her to harm herself and avoid contact with healthcare as she fears being institutionalized.  -Follow-up psych assessment  Severe protein calorie malnutrition: Give Ensure supplements.  Osteoporosis: Corrected Ca 7.8.  -Continue Tums 500mg  BID with meals.  #FEN:  -Diet: Regular -K 3.4 today, give Kdur 64mEq BID- X2 doses, ordered. - If procedure to be done, NPO from midnight.  #DVT prophylaxis: SCDs  #CODE STATUS: DNR/DNI -Defer to sister [name] if patients lacks decision-making capacity -Confirmed with patient on admission  Dispo: Disposition is deferred at this time, awaiting improvement of current medical problems.    The patient does have a current PCP (Charlott Rakes, MD) and does need an [facility] hospital follow-up appointment after discharge.  The patient does have transportation limitations that hinder transportation to clinic appointments.  .Services Needed at  time of discharge: Y = Yes, Blank = No PT:   OT:   RN:   Equipment:   Other:     LOS: 1 day   Charlott Rakes, MD 03/21/2014, 12:25 PM

## 2014-03-22 LAB — CBC
HEMATOCRIT: 29.3 % — AB (ref 36.0–46.0)
Hemoglobin: 9.1 g/dL — ABNORMAL LOW (ref 12.0–15.0)
MCH: 25.9 pg — ABNORMAL LOW (ref 26.0–34.0)
MCHC: 31.1 g/dL (ref 30.0–36.0)
MCV: 83.2 fL (ref 78.0–100.0)
PLATELETS: 683 10*3/uL — AB (ref 150–400)
RBC: 3.52 MIL/uL — AB (ref 3.87–5.11)
RDW: 17.1 % — AB (ref 11.5–15.5)
WBC: 9.3 10*3/uL (ref 4.0–10.5)

## 2014-03-22 LAB — TYPE AND SCREEN
ABO/RH(D): O POS
ANTIBODY SCREEN: NEGATIVE
UNIT DIVISION: 0
Unit division: 0

## 2014-03-22 MED ORDER — GABAPENTIN 100 MG PO CAPS: 100.0000 mg | ORAL_CAPSULE | Freq: Two times a day (BID) | ORAL | Status: DC

## 2014-03-22 MED ORDER — FERROUS SULFATE 325 (65 FE) MG PO TABS: 325.0000 mg | ORAL_TABLET | Freq: Three times a day (TID) | ORAL | Status: DC

## 2014-03-22 MED ORDER — PANTOPRAZOLE SODIUM 40 MG PO TBEC
40.0000 mg | DELAYED_RELEASE_TABLET | Freq: Two times a day (BID) | ORAL | Status: DC
Start: 2014-03-22 — End: 2014-03-27

## 2014-03-22 NOTE — Progress Notes (Signed)
Subjective: No new complaints, has chronic abdominal pain. No dizziness. Stools noted by pts nurse, dark brown and black. Pt is on iron suppliments. No vomiting.   Objective: Vital signs in last 24 hours: Filed Vitals:   03/21/14 1539 03/21/14 2155 03/21/14 2202 03/22/14 0524  BP:  131/63  137/64  Pulse:  88 88 86  Temp:  98.1 F (36.7 C)  98.4 F (36.9 C)  TempSrc:  Oral  Oral  Resp:  18 18 20   Height:      Weight:      SpO2: 94% 99% 99% 98%   Weight change:   Intake/Output Summary (Last 24 hours) at 03/22/14 0756 Last data filed at 03/22/14 1610  Gross per 24 hour  Intake    480 ml  Output   1400 ml  Net   -920 ml   GENERAL- Awake, co-operative, appears as stated age, not in any distress. HEENT- Atraumatic, normocephalic,neck supple. CARDIAC- RRR, no murmurs, rubs or gallops. RESP- Moving equal volumes of air, and clear to auscultation bilaterally, no wheezes or crackles. ABDOMEN- Soft, nontender, bowel sounds present. EXTREMITIES-  no pedal edema. SKIN- Warm, dry, No rash or lesion.  Lab Results: Basic Metabolic Panel:  Recent Labs Lab 03/19/14 1648 03/21/14 0545  NA 132* 139  K 4.5 3.4*  CL 95* 103  CO2 26 29  GLUCOSE 76 103*  BUN 19 8  CREATININE 0.48* 0.40*  CALCIUM 7.6* 7.4*   Liver Function Tests:  Recent Labs Lab 03/19/14 1648 03/21/14 0545  AST 15 16  ALT 9 10  ALKPHOS 101 93  BILITOT 0.1* 0.2*  PROT 6.2 5.7*  ALBUMIN 3.8 2.9*   CBC:  Recent Labs Lab 03/19/14 1648 03/20/14 1530  03/21/14 1800 03/22/14 0500  WBC 11.0* 10.1  < > 8.6 9.3  NEUTROABS 9.0* 6.9  --   --   --   HGB 5.2* 5.3*  < > 8.7* 9.1*  HCT 18.8* 18.5*  < > 27.3* 29.3*  MCV 80.3 78.4  < > 82.2 83.2  PLT 870* 844*  < > 608* 683*  < > = values in this interval not displayed. Anemia Panel:  Recent Labs Lab 03/20/14 1922  RETICCTPCT 3.5*   Urine Drug Screen: Drugs of Abuse     Component Value Date/Time   LABOPIA NONE DETECTED 01/10/2012 1339   COCAINSCRNUR NONE DETECTED 01/10/2012 1339   LABBENZ NONE DETECTED 01/10/2012 1339   AMPHETMU NONE DETECTED 01/10/2012 1339   THCU NONE DETECTED 01/10/2012 1339   LABBARB NONE DETECTED 01/10/2012 1339    Urinalysis:  Recent Labs Lab 03/20/14 1838  COLORURINE YELLOW  LABSPEC 1.016  PHURINE 5.0  GLUCOSEU NEGATIVE  HGBUR NEGATIVE  BILIRUBINUR NEGATIVE  KETONESUR NEGATIVE  PROTEINUR NEGATIVE  UROBILINOGEN 0.2  NITRITE NEGATIVE  LEUKOCYTESUR NEGATIVE   Studies/Results: No results found. Medications: I have reviewed the patient's current medications. Scheduled Meds: . albuterol  2.5 mg Nebulization TID  . antiseptic oral rinse  7 mL Mouth Rinse BID  . calcium carbonate  1 tablet Oral BID WC  . DULoxetine  30 mg Oral BID  . feeding supplement (ENSURE COMPLETE)  237 mL Oral TID BM  . ferrous sulfate  325 mg Oral TID WC  . gabapentin  100 mg Oral BID  . loratadine  10 mg Oral Daily  . mometasone-formoterol  2 puff Inhalation BID  . sodium chloride  3 mL Intravenous Q12H   Continuous Infusions:  PRN Meds:.sodium chloride, acetaminophen **OR** acetaminophen, albuterol,  docusate sodium, ondansetron **OR** ondansetron (ZOFRAN) IV, oxyCODONE, sodium chloride, zolpidem Assessment/Plan:  Symptomatic anemia: Hb 8 s/p pRBC x 2. Possible GI bleed. Adjusted retic count 1.5, which is an inappropriate response though likely in the setting of severe Fe deficiency. FOBT negative. Has not had Colonoscopy/endoscopy due to severe lung disease. Pt denies obvious blood loss.  - CBC stable- s/p 2 units of blood- hgb- 9.1 today, stable.  - Continue Fe 325mg  TID & Colace 100mg  BID on discharge - GI recs appreciated. - Avoid NSAIDs - discharge today, to follow up in clinic closely.   Severe COPD: 3L on home O2. No PFTs on file. Last assessed by Dr. Gwenette Greet [Pulmonology] on 04/25/13 and referred for pulmonary rehab though could not afford it per her insurance. -Continue albuterol nebs q4h  -Continue  Dulera 2 puffs BID -Continue Claritin  Chronic pain: Resolved. Reports she is on oxycodone 15mg  q6h prn for pain though reduced dose may be advised given her weight to which she is not agreeable despite discussing the risks with her. - Cont home Oxycodone -Continue home Cymbalta  Bipolar disorder: Per chart review, she has had a history of auditory hallucinations which have instructed her to harm herself and avoid contact with healthcare as she fears being institutionalized.  - Per Psych recs- cymbalta 30mg  daily, and cont gabapentin 100mg  BID.  Severe protein calorie malnutrition: Give Ensure supplements.  Osteoporosis: Corrected Ca 7.8.  -Continue Tums 500mg  BID with meals.  #FEN:  -Diet: Regular -K 3.4 today, give Kdur 67mEq BID- X2 doses, ordered. - If procedure to be done, NPO from midnight.  #DVT prophylaxis: SCDs  #CODE STATUS: DNR/DNI -Defer to sister [name] if patients lacks decision-making capacity -Confirmed with patient on admission  Dispo: Disposition is deferred at this time, awaiting improvement of current medical problems.    The patient does have a current PCP (Charlott Rakes, MD) and does need an [facility] hospital follow-up appointment after discharge.  The patient does have transportation limitations that hinder transportation to clinic appointments.  .Services Needed at time of discharge: Y = Yes, Blank = No PT:   OT:   RN:   Equipment:   Other:     LOS: 2 days   Bethena Roys, MD 03/22/2014, 7:56 AM

## 2014-03-24 NOTE — Discharge Summary (Signed)
Name: [name] MRN: [id number] DOB: [birth year] 64 y.o. PCP: Jordan Rakes, MD  Date of Admission: 03/20/2014  5:09 PM Date of Discharge: 03/24/2014 Attending Physician: Jordan Chiquito MD  Discharge Diagnosis: Symptomatic iron deficiency anemia Severe protein-calorie malnutrition Bipolar disorder Advanced COPD Chronic pain Osteoporosis   Discharge Medications:   Medication List    STOP taking these medications        AMBULATORY NON FORMULARY MEDICATION     metoCLOPramide 10 MG tablet  Commonly known as:  REGLAN      TAKE these medications        albuterol (2.5 MG/3ML) 0.083% nebulizer solution  Commonly known as:  PROVENTIL  Take 3 mLs (2.5 mg total) by nebulization every 4 (four) hours as needed. Shortness of breath     PROAIR HFA 108 (90 BASE) MCG/ACT inhaler  Generic drug:  albuterol  INHALE ONE PUFF BY MOUTH EVERY 2 TO 4 HOURS AS NEEDED FOR WHEEZING     AMBIEN PO  Take 1 tablet by mouth at bedtime.     calcium carbonate 600 MG Tabs tablet  Commonly known as:  OS-CAL  Take 600 mg by mouth 2 (two) times daily with a meal.     cholecalciferol 1000 UNITS tablet  Commonly known as:  VITAMIN D  Take 1,000 Units by mouth daily.     docusate sodium 100 MG capsule  Commonly known as:  COLACE  Take 100 mg by mouth 2 (two) times daily as needed for mild constipation.     DULoxetine 30 MG capsule  Commonly known as:  CYMBALTA  Take 30 mg by mouth 2 (two) times daily.     ferrous sulfate 325 (65 FE) MG tablet  Take 1 tablet (325 mg total) by mouth 3 (three) times daily with meals.     fexofenadine 180 MG tablet  Commonly known as:  ALLEGRA  Take 180 mg by mouth daily.     Fluticasone-Salmeterol 250-50 MCG/DOSE Aepb  Commonly known as:  ADVAIR DISKUS  INHALE 1 PUFF INTO THE LUNGS 2 TIMES DAILY     gabapentin 100 MG capsule  Commonly known as:  NEURONTIN  Take 1 capsule (100 mg total) by mouth 2 (two) times daily.     oxyCODONE 15 MG immediate release  tablet  Commonly known as:  ROXICODONE  Take 15 mg by mouth every 6 (six) hours as needed for pain.     pantoprazole 40 MG tablet  Commonly known as:  PROTONIX  Take 1 tablet (40 mg total) by mouth 2 (two) times daily.        Disposition and follow-up:   [name] was discharged from [facility] in Stable condition.  At the hospital follow up visit please address:  -Symptomatic iron deficiency anemia: adherence to Fe therapy, constipation -Severe protein-calorie malnutrition: adherence to Ensure -Bipolar disorder: adherence to Cymbalta, presence of auditory hallucinations  2.  Labs / imaging needed at time of follow-up:  -CBC (4-6 weeks after hospitalization)  3.  Pending labs/ test needing follow-up:  -None  Follow-up Appointments:     Follow-up Information    Follow up with Jordan Rakes, MD In 1 week.   Specialty:  Internal Medicine   Why:  We will call you with an appointment to see [place] in a week.   Contact information:   [place] [phone]       Discharge Instructions: Discharge Instructions    Diet -  low sodium heart healthy    Complete by:  As directed      Discharge instructions    Complete by:  As directed   We will call you with an appointment in a week.  Please take your iron tablets three times a day. This is because your Iron levels were low.  Also take a stool softner- Colace/docusate every day, this is to prevent constipation which is a side effect of Iron tablets.  Also we prescribed a medication called Protonix/pantoprazole- take 40mg  two times a day. We will gradually change this prescription to once a day when you follow up in clinic.  Continue with the Cymbalta you were taking, we have also prescribed Gabapentin/neurontin to help with the pain you are having.     Increase activity slowly    Complete by:  As directed            Consultations: Treatment Team:  Jordan Parcel,  MD  Procedures Performed:  Dg Chest 2 View  03/19/2014   CLINICAL DATA:  Emphysema.  Worsening shortness of breath.  Cough.  EXAM: CHEST  2 VIEW  COMPARISON:  11/13/2013  FINDINGS: Heart size remains within normal limits. Severe emphysema again demonstrated. No evidence of acute infiltrate or pulmonary edema. No evidence of pleural effusion. No hilar or mediastinal masses identified.  IMPRESSION: Severe emphysema.  No active disease.   Electronically Signed   By: [name] M.D.   On: 03/19/2014 16:42    Admission HPI: [name] is a 64 year old female with bipolar disorder, COPD (2L O2), Fe deficiency anemia, osteoporosis, chronic pain, severe protein-calorie malnutrition who presented with fatigue and shortness of breath.  Yesterday, she was seen by Dr. Gordy Mcclure in clinic and reported worsening fatigue, dyspnea, productive cough, chest tightness though was not in any respiratory distress. CXR along with CBC/CMET were ordered, and she was sent out with symptomatic treatment for presumed viral URI. Hb was initially found to be 7.1, and she was called back for repeat testing; Hb was found to be 5.3, and it was decided she would need to be admitted. She denies any signs of bleeding: hemoptysis, hematochezia, melena, vaginal bleeding.   She was last hospitalized 11/08/13-11/11/13 for acute on chronic respiratory failure in the setting of CAP and found to have Fe deficiency [Fe 14, ferritin 10]. Though she was discharged on Fe tablets, she reports not knowing that she was supposed to take this medicine.    Hospital Course by problem list:   Symptomatic iron deficiency anemia: 2/2 non-adherence to iron supplementation. She was given pRBC x 2 which increased Hb to 9. She denied active bleeding during her hospital stay; FOBT was negative. GI was consulted and recommended to follow her and transfuse as necessary as her pulmonary disease limited her ability to tolerate any kind of endoscopy. On the day of  discharge, she was tolerating PO intake and hemodynamically stable and asked to continue Fe 325mg  TID with Colace 100mg  BID. She was also instructed to avoid NSAIDs. At the time of follow-up, her adherence to iron supplementation should be reassessed.  Severe protein-calorie malnutrition: Per dietician's assessment. She was started on Ensure Complete TID WC.  Bipolar disorder: Psychiatry was consulted to assess the nature of her auditory hallucinations which she had described to her PCP as impeding her from seeking care for fear of "being locked up" as well as encouraging her to hurt herself. She was restarted on Cymbalta 30mg  BID for depression and gabapentin 100mg   BID for neuropathic pain with follow-up at [facility].  Advanced COPD: Remained stable on home medications.  Chronic pain: Remained stable on home medications.  Osteoporosis: Remained stable on home medications.   Discharge Vitals:   BP 129/63 mmHg  Pulse 79  Temp(Src) 99.1 F (37.3 C) (Oral)  Resp 20  Ht 5\' 1"  (1.549 m)  Wt 86 lb 9.6 oz (39.282 kg)  BMI 16.37 kg/m2  SpO2 95%  Discharge Labs:  No results found for this or any previous visit (from the past 24 hour(s)).  Signed: Charlott Rakes, MD 03/31/2014, 2:18 PM    Services Ordered on Discharge: None Equipment Ordered on Discharge: None

## 2014-03-26 ENCOUNTER — Other Ambulatory Visit: Payer: Self-pay | Admitting: Internal Medicine

## 2014-03-26 DIAGNOSIS — J449 Chronic obstructive pulmonary disease, unspecified: Secondary | ICD-10-CM

## 2014-03-27 ENCOUNTER — Encounter: Payer: Self-pay | Admitting: Internal Medicine

## 2014-03-27 ENCOUNTER — Telehealth: Payer: Self-pay | Admitting: Licensed Clinical Social Worker

## 2014-03-27 ENCOUNTER — Ambulatory Visit (INDEPENDENT_AMBULATORY_CARE_PROVIDER_SITE_OTHER): Payer: Medicare Other | Admitting: Internal Medicine

## 2014-03-27 VITALS — BP 149/64 | HR 76 | Temp 98.0°F | Ht 61.0 in | Wt 85.0 lb

## 2014-03-27 DIAGNOSIS — J439 Emphysema, unspecified: Secondary | ICD-10-CM | POA: Diagnosis not present

## 2014-03-27 DIAGNOSIS — D509 Iron deficiency anemia, unspecified: Secondary | ICD-10-CM | POA: Diagnosis not present

## 2014-03-27 DIAGNOSIS — Z0000 Encounter for general adult medical examination without abnormal findings: Secondary | ICD-10-CM | POA: Diagnosis not present

## 2014-03-27 DIAGNOSIS — D649 Anemia, unspecified: Secondary | ICD-10-CM | POA: Diagnosis not present

## 2014-03-27 LAB — CBC
HCT: 32.2 % — ABNORMAL LOW (ref 36.0–46.0)
Hemoglobin: 9.8 g/dL — ABNORMAL LOW (ref 12.0–15.0)
MCH: 26.6 pg (ref 26.0–34.0)
MCHC: 30.4 g/dL (ref 30.0–36.0)
MCV: 87.3 fL (ref 78.0–100.0)
PLATELETS: 544 10*3/uL — AB (ref 150–400)
RBC: 3.69 MIL/uL — ABNORMAL LOW (ref 3.87–5.11)
RDW: 19.9 % — ABNORMAL HIGH (ref 11.5–15.5)
WBC: 7 10*3/uL (ref 4.0–10.5)

## 2014-03-27 NOTE — Addendum Note (Signed)
Addended by: Jones Bales on: 03/27/2014 05:03 PM   Modules accepted: Level of Service

## 2014-03-27 NOTE — Assessment & Plan Note (Signed)
Pt reports breathing is back to baseline.  She continues on 3L of home O2 with sats today ~98%. -have [facility] RN check O2 sats (may need to scale back on liters since saturation is at 98%) -continue current meds

## 2014-03-27 NOTE — Progress Notes (Signed)
Patient ID: [name], female   DOB: 13-Apr-1950, 64 y.o.   MRN: [id number]    Subjective:   Patient ID: [name] female    DOB: Jul 12, 1950 64 y.o.    MRN: [id number] Health Maintenance Due: Health Maintenance Due  Topic Date Due  . TETANUS/TDAP  02/22/1970  . ZOSTAVAX  02/23/2011  . PAP SMEAR  03/29/2011    _________________________________________________  HPI: [name] is a 64 y.o. female here for a hospital f/u.  Pt has a PMH outlined below.  Please see problem-based charting assessment and plan note for further details of medical issues addressed at today's visit.  PMH: Past Medical History  Diagnosis Date  . Depression   . Suicidal ideation 2007     attempted overdose 2012  Dr. Tivis Ringer report  . Bipolar disorder   . Substance abuse      narcotics, alcohol, tobacco  . Chronic pain syndrome     follows at [facility]  . DEFICIENCY, VITAMIN D NOS 01/03/2007  . Chronic lower back pain   . Anxiety   . OSTEOPOROSIS 06/17/2009    DEXA 05/2009 : L femur -2.9; R femur -2.5. Alendronate on med list but not taking. Needs addressed ASAP as h/o fractures.    . Elevated liver function tests   . On home oxygen therapy     "3L; 24/7" (03/20/2014)  . Shortness of breath     "all the time" (02/27/2013)  . COPD (chronic obstructive pulmonary disease)     oxygen dependent  . Sciatic pain   . Anemia   . Pneumonia X 1?  . Hyperthyroidism     "borderline"  . History of blood transfusion     "getting my 1st later tonight" (03/20/2014)  . Arthritis     "legs, back; hips, primarily left hip" (03/20/2014)    Medications: Current Outpatient Prescriptions on File Prior to Visit  Medication Sig Dispense Refill  . albuterol (PROVENTIL) (2.5 MG/3ML) 0.083% nebulizer solution Take 3 mLs (2.5 mg total) by nebulization every 4 (four) hours as needed. Shortness of breath 75 mL 12  . calcium carbonate (OS-CAL) 600 MG TABS tablet Take 600 mg by mouth 2 (two) times  daily with a meal.    . cholecalciferol (VITAMIN D) 1000 UNITS tablet Take 1,000 Units by mouth daily.    . DULoxetine (CYMBALTA) 30 MG capsule Take 30 mg by mouth 2 (two) times daily.     . ferrous sulfate 325 (65 FE) MG tablet Take 1 tablet (325 mg total) by mouth 3 (three) times daily with meals. 90 tablet 2  . Fluticasone-Salmeterol (ADVAIR DISKUS) 250-50 MCG/DOSE AEPB INHALE 1 PUFF INTO THE LUNGS 2 TIMES DAILY 5 each 6  . oxyCODONE (ROXICODONE) 15 MG immediate release tablet Take 15 mg by mouth every 6 (six) hours as needed for pain.    Marland Kitchen PROAIR HFA 108 (90 BASE) MCG/ACT inhaler INHALE ONE PUFF BY MOUTH EVERY 2 TO 4 HOURS AS NEEDED FOR WHEEZING 8.5 g 1  . Zolpidem Tartrate (AMBIEN PO) Take 1 tablet by mouth at bedtime.    . docusate sodium (COLACE) 100 MG capsule Take 100 mg by mouth 2 (two) times daily as needed for mild constipation.     No current facility-administered medications on file prior to visit.    Allergies: Allergies  Allergen Reactions  . Banana Shortness Of Breath and Nausea And Vomiting  . Pollen Extract Other (See Comments)    Seasonal  FH: Family History  Problem Relation Age of Onset  . Stroke Neg Hx   . Cancer Neg Hx   . Myelodysplastic syndrome Father     Died from myelofibrosis though diagnosed post-mortem    SH: History   Social History  . Marital Status: Divorced    Spouse Name: N/A    Number of Children: 2  . Years of Education: N/A   Occupational History  . retired    Social History Main Topics  . Smoking status: Current Every Day Smoker -- 0.20 packs/day for 45 years    Types: Cigarettes  . Smokeless tobacco: Never Used     Comment: 03/20/2014 "smoking 2 cigarettes/day; that's down from 2 ppd"  . Alcohol Use: Yes     Comment: 03/20/2014 "nothing in 7 years; never had a problem w/it"  . Drug Use: No  . Sexual Activity: No   Other Topics Concern  . None   Social History Narrative    Review of Systems: Constitutional: Negative  for fever, chills and weight loss.  Eyes: Negative for blurred vision.  Respiratory: Negative for cough and shortness of breath.  Cardiovascular: Negative for chest pain, palpitations and leg swelling.  Gastrointestinal: Negative for nausea, vomiting, abdominal pain, diarrhea, constipation and blood in stool.  Genitourinary: Negative for dysuria, urgency and frequency.  Musculoskeletal: Negative for myalgias and +back pain.  Neurological: Negative for dizziness, +weakness and -headaches.     Objective:   Vital Signs: Filed Vitals:   03/27/14 1407  BP: 149/64  Pulse: 76  Temp: 98 F (36.7 C)  TempSrc: Oral  Height: 5\' 1"  (1.549 m)  Weight: 85 lb (38.556 kg)  SpO2: 98%      BP Readings from Last 3 Encounters:  03/27/14 149/64  03/22/14 129/63  03/19/14 133/61    Physical Exam: Constitutional: Vital signs reviewed.  Patient appears older than her stated age.  She is chronically ill appearing, kyphotic, extremely pale, and cachetic.    Head: Normocephalic and atraumatic. Eyes: PERRL, EOMI, conjunctivae pale, no scleral icterus.  Neck: Supple. Cardiovascular: RRR, no MRG. Pulmonary/Chest: normal effort, CTAB, no wheezes, rales, or rhonchi. Abdominal: Thin.  Soft. NT/ND +BS. Neurological: A&O x3, cranial nerves II-XII are grossly intact, moving all extremities. Extremities: 2+DP b/l; no pitting edema. Skin: Warm, dry and intact.   Assessment & Plan:   Assessment and plan was discussed and formulated with my attending.

## 2014-03-27 NOTE — Patient Instructions (Signed)
Thank you for your visit today.  Please return to the internal medicine clinic in 1 month(s) or sooner if needed.   Your current medical regimen is effective;  continue present plan and take all medications as prescribed.    Edwena Blow, our Education officer, museum, will be in contact with you regarding your request for additional services.  We will also be sending out an RN to draw your blood work to check your hemoglobin level.     Please be sure to bring all of your medications with you to every visit; this includes herbal supplements, vitamins, eye drops, and any over-the-counter medications.   Should you have any questions regarding your medications and/or any new or worsening symptoms, please be sure to call the clinic at [phone].   If you believe that you are suffering from a life threatening condition or one that may result in the loss of limb or function, then you should call 911 or proceed to the nearest Emergency Department.   Iron Deficiency Anemia Anemia is a condition in which there are less red blood cells or hemoglobin in the blood than normal. Hemoglobin is the part of red blood cells that carries oxygen. Iron deficiency anemia is anemia caused by too little iron. It is the most common type of anemia. It may leave you tired and short of breath. CAUSES   Lack of iron in the diet.  Poor absorption of iron, as seen with intestinal disorders.  Intestinal bleeding.  Heavy periods. SIGNS AND SYMPTOMS  Mild anemia may not be noticeable. Symptoms may include:  Fatigue.  Headache.  Pale skin.  Weakness.  Tiredness.  Shortness of breath.  Dizziness.  Cold hands and feet.  Fast or irregular heartbeat. DIAGNOSIS  Diagnosis requires a thorough evaluation and physical exam by your health care provider. Blood tests are generally used to confirm iron deficiency anemia. Additional tests may be done to find the underlying cause of your anemia. These may include:  Testing for  blood in the stool (fecal occult blood test).  A procedure to see inside the colon and rectum (colonoscopy).  A procedure to see inside the esophagus and stomach (endoscopy). TREATMENT  Iron deficiency anemia is treated by correcting the cause of the deficiency. Treatment may involve:  Adding iron-rich foods to your diet.  Taking iron supplements. Pregnant or breastfeeding women need to take extra iron because their normal diet usually does not provide the required amount.  Taking vitamins. Vitamin C improves the absorption of iron. Your health care provider may recommend that you take your iron tablets with a glass of orange juice or vitamin C supplement.  Medicines to make heavy menstrual flow lighter.  Surgery. HOME CARE INSTRUCTIONS   Take iron as directed by your health care provider.  If you cannot tolerate taking iron supplements by mouth, talk to your health care provider about taking them through a vein (intravenously) or an injection into a muscle.  For the best iron absorption, iron supplements should be taken on an empty stomach. If you cannot tolerate them on an empty stomach, you may need to take them with food.  Do not drink milk or take antacids at the same time as your iron supplements. Milk and antacids may interfere with the absorption of iron.  Iron supplements can cause constipation. Make sure to include fiber in your diet to prevent constipation. A stool softener may also be recommended.  Take vitamins as directed by your health care provider.  Eat a diet  rich in iron. Foods high in iron include liver, lean beef, whole-grain bread, eggs, dried fruit, and dark green leafy vegetables. SEEK IMMEDIATE MEDICAL CARE IF:   You faint. If this happens, do not drive. Call your local emergency services (911 in [place] if no other help is available.  You have chest pain.  You feel nauseous or vomit.  You have severe or increased shortness of breath with  activity.  You feel weak.  You have a rapid heartbeat.  You have unexplained sweating.  You become light-headed when getting up from a chair or bed. MAKE SURE YOU:   Understand these instructions.  Will watch your condition.  Will get help right away if you are not doing well or get worse. Document Released: 02/26/2000 Document Revised: 03/05/2013 Document Reviewed: 11/05/2012 [facility] Patient Information 2015 [place], [place]. This information is not intended to replace advice given to you by your health care provider. Make sure you discuss any questions you have with your health care provider.

## 2014-03-27 NOTE — Assessment & Plan Note (Signed)
Pt recently discharged from hospital for hgb of 5.2 s/p 2 units of prbcs; hgb today 9.8.  GI consulted and pt is not a candidate for any type of procedure nor does she want this.  Source of blood loss is unclear.  She had not been taking iron supplementation prior to hospitalization because she was not aware she was supposed to be taking it.  She has since been compliant.  She reports still feeling "yucky."  Workup was neg for any hemolytic process--LDH wnl, haptoglobin was elevated but is an acute phase reactant and nonspecific, reticulocyte count was elevated appropriately.  She mentions that her father had myelofibrosis.  Her workup has not been suggestive of myelofibrosis.   -continue to monitor-->would like to repeat hgb in ~1 week to make sure it continues to be stable (will see if Edwena Blow can arrange [facility] RN to collect labs) -advised her to continue iron supplementation tid with colace -provided info on iron def anemia -will f/u with PCP in 1 month or sooner if needed

## 2014-03-27 NOTE — Assessment & Plan Note (Signed)
Pt requests info regarding ALF.  She states she is able to remain at [facility] if she has someone to help her with meal preparation.  She has an aide which comes out and helps her with bathing and chores.  Another option may be meals on wheels. -referral to [facility] for additional info regarding available resources

## 2014-03-27 NOTE — Telephone Encounter (Signed)
Ms. Losee has been referred to CSW for home health services.  CSW placed call to patient, pt agreeable stating she has used [place] in the past.  CSW will fax referral to [place].

## 2014-03-27 NOTE — Progress Notes (Signed)
Case discussed with Dr. Gill soon after the resident saw the patient.  We reviewed the resident's history and exam and pertinent patient test results.  I agree with the assessment, diagnosis, and plan of care documented in the resident's note. 

## 2014-03-28 LAB — PATHOLOGIST SMEAR REVIEW

## 2014-03-28 NOTE — Telephone Encounter (Signed)
CSW placed called to pt to follow up on meal prep concerns.  CSW left message requesting return call. CSW provided contact hours and phone number.

## 2014-04-01 DIAGNOSIS — M545 Low back pain: Secondary | ICD-10-CM | POA: Diagnosis not present

## 2014-04-01 DIAGNOSIS — M5412 Radiculopathy, cervical region: Secondary | ICD-10-CM | POA: Diagnosis not present

## 2014-04-01 DIAGNOSIS — G890 Central pain syndrome: Secondary | ICD-10-CM | POA: Diagnosis not present

## 2014-04-08 NOTE — Telephone Encounter (Signed)
Triage RN notified this worker, [facility] agency has been unable to reach Ms. Retana.  CSW spoke with Ms. Delgadillo on 04/03/14, pt called in to [place] to request listing of ALF.  CSW placed call to Ms. Strauser this morning.  Pt states [facility] contacted hr sister and left message, pt was notified by sister today.  Ms. Pfohl contacted [facility] and will be scheduled for services.  CSW informed Ms. Harada ALF listing will be placed in mail today.

## 2014-04-09 ENCOUNTER — Other Ambulatory Visit: Payer: Self-pay | Admitting: *Deleted

## 2014-04-09 NOTE — Telephone Encounter (Signed)
Pt wants to know if she should start using this again, ph# [phone]

## 2014-04-10 NOTE — Telephone Encounter (Signed)
Can you ask her how frequently she uses it in a given day? I can prescribe it accordingly. I also think she has a follow-up appointment at which time we can talk about it more, and I can put in for a temporary refill if needed.

## 2014-04-13 DIAGNOSIS — J449 Chronic obstructive pulmonary disease, unspecified: Secondary | ICD-10-CM | POA: Diagnosis not present

## 2014-04-14 NOTE — Telephone Encounter (Signed)
i have called pt 3 times, lm, she called back today but did not answer when i rtc

## 2014-04-15 ENCOUNTER — Telehealth: Payer: Self-pay | Admitting: *Deleted

## 2014-04-15 DIAGNOSIS — F319 Bipolar disorder, unspecified: Secondary | ICD-10-CM | POA: Diagnosis not present

## 2014-04-15 DIAGNOSIS — D500 Iron deficiency anemia secondary to blood loss (chronic): Secondary | ICD-10-CM | POA: Diagnosis not present

## 2014-04-15 DIAGNOSIS — M810 Age-related osteoporosis without current pathological fracture: Secondary | ICD-10-CM | POA: Diagnosis not present

## 2014-04-15 DIAGNOSIS — F329 Major depressive disorder, single episode, unspecified: Secondary | ICD-10-CM | POA: Diagnosis not present

## 2014-04-15 DIAGNOSIS — J441 Chronic obstructive pulmonary disease with (acute) exacerbation: Secondary | ICD-10-CM | POA: Diagnosis not present

## 2014-04-15 NOTE — Telephone Encounter (Signed)
Call from [place], [place] with [facility] [phone]  Home health orders received by [facility] for after hospital discharge.  Nurse to draw HGB at home weekly. Today with the first day they were able to get in touch with pt to start orders. It has been greater than 2 weeks since order written. The order has now ended.  Do you still want HGB drawn at next visit?

## 2014-04-15 NOTE — Telephone Encounter (Signed)
I talked with Dr. Posey Pronto.  He would like HGB drawn at this weeks visit. HHN informed, she will see pt on Friday.

## 2014-04-15 NOTE — Telephone Encounter (Signed)
Pt uses albuterol 4 times daily and uses ipratropium 2 times daily, she called me back today. Please print this because i will need to fax it

## 2014-04-16 MED ORDER — ALBUTEROL SULFATE (2.5 MG/3ML) 0.083% IN NEBU: 2.5000 mg | INHALATION_SOLUTION | RESPIRATORY_TRACT | Status: DC | PRN

## 2014-04-16 MED ORDER — IPRATROPIUM BROMIDE 0.02 % IN SOLN: 0.5000 mg | Freq: Two times a day (BID) | RESPIRATORY_TRACT | Status: DC

## 2014-04-16 NOTE — Telephone Encounter (Signed)
For the albuterol, I assume this is her nebulizer treatment.

## 2014-04-16 NOTE — Telephone Encounter (Signed)
Pt called and informed.

## 2014-04-16 NOTE — Telephone Encounter (Signed)
Faxed to mail order [phone]

## 2014-04-17 DIAGNOSIS — J449 Chronic obstructive pulmonary disease, unspecified: Secondary | ICD-10-CM | POA: Diagnosis not present

## 2014-04-17 MED ORDER — ALBUTEROL SULFATE (2.5 MG/3ML) 0.083% IN NEBU: 2.5000 mg | INHALATION_SOLUTION | RESPIRATORY_TRACT | Status: DC

## 2014-04-17 NOTE — Telephone Encounter (Signed)
Script faxed to provider

## 2014-04-17 NOTE — Addendum Note (Signed)
Addended by: Riccardo Dubin on: 04/17/2014 10:48 AM   Modules accepted: Orders

## 2014-04-17 NOTE — Telephone Encounter (Signed)
I was informed by RN Edd Fabian that insurance does not cover albuterol if q4h prn but will if switched to q4h. As her disease appears otherwise stable, I will go ahead and write for this amount and revisit at her follow-up visit.

## 2014-04-18 DIAGNOSIS — Z794 Long term (current) use of insulin: Secondary | ICD-10-CM | POA: Diagnosis not present

## 2014-04-18 DIAGNOSIS — E119 Type 2 diabetes mellitus without complications: Secondary | ICD-10-CM | POA: Diagnosis not present

## 2014-04-18 DIAGNOSIS — E785 Hyperlipidemia, unspecified: Secondary | ICD-10-CM | POA: Diagnosis not present

## 2014-04-18 DIAGNOSIS — J441 Chronic obstructive pulmonary disease with (acute) exacerbation: Secondary | ICD-10-CM | POA: Diagnosis not present

## 2014-04-18 DIAGNOSIS — D500 Iron deficiency anemia secondary to blood loss (chronic): Secondary | ICD-10-CM | POA: Diagnosis not present

## 2014-04-18 DIAGNOSIS — M810 Age-related osteoporosis without current pathological fracture: Secondary | ICD-10-CM | POA: Diagnosis not present

## 2014-04-18 DIAGNOSIS — E1169 Type 2 diabetes mellitus with other specified complication: Secondary | ICD-10-CM | POA: Diagnosis not present

## 2014-04-18 DIAGNOSIS — F329 Major depressive disorder, single episode, unspecified: Secondary | ICD-10-CM | POA: Diagnosis not present

## 2014-04-18 DIAGNOSIS — D509 Iron deficiency anemia, unspecified: Secondary | ICD-10-CM | POA: Diagnosis not present

## 2014-04-18 DIAGNOSIS — J449 Chronic obstructive pulmonary disease, unspecified: Secondary | ICD-10-CM | POA: Diagnosis not present

## 2014-04-18 DIAGNOSIS — I10 Essential (primary) hypertension: Secondary | ICD-10-CM | POA: Diagnosis not present

## 2014-04-18 DIAGNOSIS — F319 Bipolar disorder, unspecified: Secondary | ICD-10-CM | POA: Diagnosis not present

## 2014-04-24 ENCOUNTER — Encounter: Payer: Self-pay | Admitting: Internal Medicine

## 2014-04-24 DIAGNOSIS — F319 Bipolar disorder, unspecified: Secondary | ICD-10-CM | POA: Diagnosis not present

## 2014-04-24 DIAGNOSIS — J441 Chronic obstructive pulmonary disease with (acute) exacerbation: Secondary | ICD-10-CM | POA: Diagnosis not present

## 2014-04-24 DIAGNOSIS — M810 Age-related osteoporosis without current pathological fracture: Secondary | ICD-10-CM | POA: Diagnosis not present

## 2014-04-24 DIAGNOSIS — D500 Iron deficiency anemia secondary to blood loss (chronic): Secondary | ICD-10-CM | POA: Diagnosis not present

## 2014-04-24 DIAGNOSIS — F329 Major depressive disorder, single episode, unspecified: Secondary | ICD-10-CM | POA: Diagnosis not present

## 2014-04-24 NOTE — Progress Notes (Signed)
Patient ID: [name], female   DOB: 14-Aug-1950, 63 y.o.   MRN: [id number]  I am completing paperwork today from [place] that requires my signature:  Received 04/18/14: Authorization for Atrovent twice a day and albuterol nebulizer solution every 4 hours as needed  Also on this day, I received the results of her most recent lab work from the home health nurse with regards to her hemoglobin which was found to be 11.6, improved from 9.8 back on 03/27/14 at her last hospitalization.   We'll look forward to seeing her at her next office visit sometime this month.

## 2014-04-25 ENCOUNTER — Telehealth: Payer: Self-pay | Admitting: *Deleted

## 2014-04-25 DIAGNOSIS — F329 Major depressive disorder, single episode, unspecified: Secondary | ICD-10-CM | POA: Diagnosis not present

## 2014-04-25 DIAGNOSIS — M810 Age-related osteoporosis without current pathological fracture: Secondary | ICD-10-CM | POA: Diagnosis not present

## 2014-04-25 DIAGNOSIS — J441 Chronic obstructive pulmonary disease with (acute) exacerbation: Secondary | ICD-10-CM | POA: Diagnosis not present

## 2014-04-25 DIAGNOSIS — F319 Bipolar disorder, unspecified: Secondary | ICD-10-CM | POA: Diagnosis not present

## 2014-04-25 DIAGNOSIS — D500 Iron deficiency anemia secondary to blood loss (chronic): Secondary | ICD-10-CM | POA: Diagnosis not present

## 2014-04-25 NOTE — Telephone Encounter (Signed)
I am okay with this

## 2014-04-25 NOTE — Telephone Encounter (Signed)
Call from [facility], Physical Therapist with [facility] - # [phone]  PT was out to see pt yesterday and is requesting Verbal Order to see pt for Physical Therapy 2 times a week for 6 weeks. Will work on strengthening, balance and conditioning.  Will this be okay with you?

## 2014-04-28 ENCOUNTER — Ambulatory Visit: Payer: Self-pay | Admitting: Internal Medicine

## 2014-04-29 ENCOUNTER — Ambulatory Visit: Payer: Self-pay | Admitting: Internal Medicine

## 2014-04-29 DIAGNOSIS — F329 Major depressive disorder, single episode, unspecified: Secondary | ICD-10-CM | POA: Diagnosis not present

## 2014-04-29 DIAGNOSIS — M810 Age-related osteoporosis without current pathological fracture: Secondary | ICD-10-CM | POA: Diagnosis not present

## 2014-04-29 DIAGNOSIS — J441 Chronic obstructive pulmonary disease with (acute) exacerbation: Secondary | ICD-10-CM | POA: Diagnosis not present

## 2014-04-29 DIAGNOSIS — D500 Iron deficiency anemia secondary to blood loss (chronic): Secondary | ICD-10-CM | POA: Diagnosis not present

## 2014-04-29 DIAGNOSIS — F319 Bipolar disorder, unspecified: Secondary | ICD-10-CM | POA: Diagnosis not present

## 2014-04-29 NOTE — Telephone Encounter (Signed)
PT informed

## 2014-04-30 ENCOUNTER — Encounter: Payer: Self-pay | Admitting: Internal Medicine

## 2014-04-30 ENCOUNTER — Telehealth: Payer: Self-pay | Admitting: Internal Medicine

## 2014-04-30 NOTE — Telephone Encounter (Signed)
Call to patient to confirm appointment for 05/01/14 at 10:15 lmtcb

## 2014-04-30 NOTE — Progress Notes (Signed)
Patient ID: [name], female   DOB: 1950/05/23, 64 y.o.   MRN: [id number]   I am completing paperwork today from [place] that requires my signature:  Received 04/07/14: Skilled nursing attempted to call the patient but got no response.  Received 04/08/14: Patient reported that she was not receiving the phone calls from [place] but called back because her sister told her at they were attempting to call her. She agrees to begin services on this week.  Received 04/25/14: Skilled nursing attempted to call the patient twice but received no response.  She is due to see me in clinic tomorrow. I will reiterate to her the importance of staying in touch with her home health agency.

## 2014-05-01 ENCOUNTER — Ambulatory Visit (INDEPENDENT_AMBULATORY_CARE_PROVIDER_SITE_OTHER): Payer: Medicare Other | Admitting: Internal Medicine

## 2014-05-01 ENCOUNTER — Ambulatory Visit (HOSPITAL_COMMUNITY)
Admission: RE | Admit: 2014-05-01 | Discharge: 2014-05-01 | Disposition: A | Discharge status: Home / Self Care | Payer: Medicare Other | Source: Ambulatory Visit | Attending: Internal Medicine | Admitting: Internal Medicine

## 2014-05-01 ENCOUNTER — Encounter: Payer: Self-pay | Admitting: Internal Medicine

## 2014-05-01 VITALS — BP 150/76 | HR 80 | Temp 98.5°F | Wt 88.8 lb

## 2014-05-01 DIAGNOSIS — J441 Chronic obstructive pulmonary disease with (acute) exacerbation: Secondary | ICD-10-CM | POA: Insufficient documentation

## 2014-05-01 DIAGNOSIS — G8929 Other chronic pain: Secondary | ICD-10-CM | POA: Diagnosis not present

## 2014-05-01 DIAGNOSIS — D509 Iron deficiency anemia, unspecified: Secondary | ICD-10-CM

## 2014-05-01 DIAGNOSIS — J449 Chronic obstructive pulmonary disease, unspecified: Secondary | ICD-10-CM | POA: Diagnosis not present

## 2014-05-01 DIAGNOSIS — R197 Diarrhea, unspecified: Secondary | ICD-10-CM | POA: Diagnosis not present

## 2014-05-01 DIAGNOSIS — F1910 Other psychoactive substance abuse, uncomplicated: Secondary | ICD-10-CM | POA: Insufficient documentation

## 2014-05-01 MED ORDER — PREDNISONE 20 MG PO TABS: 40.0000 mg | ORAL_TABLET | Freq: Every day | ORAL | Status: DC

## 2014-05-01 NOTE — Assessment & Plan Note (Addendum)
Overview -Reports that she has been having bowel movements 4-5 times a day for the last several days.  -Feels this was all preceded by a conversation she had with her friend in which she had to relive memories of a very dark time in her past.  -Since that point, she reports increased stress anxiety, flashbacks, sleep disturbance, feeling like she is more on edge, ear debility. She has not been taking Colace with her iron supplementation.  -She denies any change in appetite, abdominal pain, nausea, vomiting, dizziness, changes in ambulation, numbness, tingling, fever, sick contacts, changes in her eating or drinking habits  Assessment  -Active urinary incontinence, numbness/tingling is reassuring for no neurologic process like cord compression -Absence of fever, abdominal pain, nausea, vomiting is reassuring for no acute infection, like acute gastroenteritis, or hepatobiliary disease -Only associated trigger at onset of symptoms was reminiscing over traumatic past memories which does call to question anxiety related component -Hemodynamic stability is reassuring for no dehydration  Plan -Advised her to continue monitoring to see if her symptoms improve over the next several days -Plan to follow-up on Monday -Reminded her to take Cymbalta 30 mg twice a day not just once a day

## 2014-05-01 NOTE — Assessment & Plan Note (Signed)
-  She is currently taking oxycodone 50 mg every 6 hours as needed.  -She tells me on a good day that she requires 4 tablets on a bad day she might sneak an extra one and take a total of 5.  -She is followed by a pain clinic and is due to see them next week.

## 2014-05-01 NOTE — Progress Notes (Signed)
   Subjective:    Patient ID: [name], female    DOB: [birth year], 64 y.o.   MRN: [id number]  HPI [name] is a 64 year old female with end-stage COPD, iron deficiency anemia, osteoporosis who presents today for follow-up visit. Please see assessment & plan for documentation of each problem.   Review of Systems  Constitutional: Negative for fever.  Respiratory: Positive for shortness of breath.   Cardiovascular: Negative for chest pain.  Gastrointestinal: Positive for diarrhea. Negative for nausea and abdominal pain.  Psychiatric/Behavioral: Positive for sleep disturbance. Negative for hallucinations. The patient is nervous/anxious.        Objective:   Physical Exam Constitutional: She is oriented to person, place, and time. She appears well-developed and well-nourished. No distress.  HENT:  Head: Normocephalic and atraumatic.  Eyes: Conjunctivae are normal. Pupils are equal, round, and reactive to light.  Cardiovascular: Normal rate, regular rhythm and normal heart sounds.  Exam reveals no gallop and no friction rub.   No murmur heard. Pulmonary/Chest: Poor airflow bilaterally though without any concerning sounds. Abdominal: Soft. Bowel sounds are normal. She exhibits no distension. There is no tenderness.  Neurological: She is alert and oriented to person, place, and time. No cranial nerve deficit. Coordination normal.  Skin: Skin is warm and dry. She is not diaphoretic.  Psychiatric: Her behavior is normal.         Assessment & Plan:

## 2014-05-01 NOTE — Assessment & Plan Note (Signed)
Overview -She reports that she has been taking iron tablets 3 times a day with meals.  -Of note, her last hemoglobin when checked by home health was 11.6 as of 04/24/14.  -She denies any signs of active bleeding, like hematochezia, hematuria, melena, hemoptysis.  Assessment -Appears improving and responding to therapy -Defer checking CBC today as it was checked last week  Plan -Continue current regimen

## 2014-05-01 NOTE — Patient Instructions (Signed)
Please try to bring all your medicines next time. This will help [place] keep you safe from mistakes.  For your breathing, please start taking prednisone today, 2 tablets, and for the next 4 days as well.  If things do not improve tomorrow, please come back and see [place].  Keep up the good work!

## 2014-05-01 NOTE — Assessment & Plan Note (Addendum)
Overview -Reports that she has increased shortness of breath over the last several days which she thinks might be related to the cold weather -Associated with brown phlegm which is not normal for her -Denies any fevers or increased oxygen requirement though reports feeling myalgias  Assessment -Appears consistent with her prior presentation of acute COPD exacerbation though is mild given that the only cardinal symptom she has sputum production without purulence -Pulmonary exam findings are reassuring for no pneumonia -Acute viral bronchitis is possible though less likely in the absence of sick contact since she spends most her time alone at home  Plan -Start prednisone 40 mg 5 days -Check chest x-ray today -Call her tomorrow and if no improvement will call in an antibiotic  ADDENDUM 05/01/2014  4:46 PM:  -Chest x-ray findings otherwise reassuring  ADDENDUM 05/05/2014  3:27 PM:  -Unable to reach patient successfully have left a voicemail asking her to call us back at the clinic

## 2014-05-02 DIAGNOSIS — M810 Age-related osteoporosis without current pathological fracture: Secondary | ICD-10-CM | POA: Diagnosis not present

## 2014-05-02 DIAGNOSIS — J441 Chronic obstructive pulmonary disease with (acute) exacerbation: Secondary | ICD-10-CM | POA: Diagnosis not present

## 2014-05-02 DIAGNOSIS — F329 Major depressive disorder, single episode, unspecified: Secondary | ICD-10-CM | POA: Diagnosis not present

## 2014-05-02 DIAGNOSIS — D500 Iron deficiency anemia secondary to blood loss (chronic): Secondary | ICD-10-CM | POA: Diagnosis not present

## 2014-05-02 DIAGNOSIS — F319 Bipolar disorder, unspecified: Secondary | ICD-10-CM | POA: Diagnosis not present

## 2014-05-06 DIAGNOSIS — M25552 Pain in left hip: Secondary | ICD-10-CM | POA: Diagnosis not present

## 2014-05-06 DIAGNOSIS — M810 Age-related osteoporosis without current pathological fracture: Secondary | ICD-10-CM | POA: Diagnosis not present

## 2014-05-06 DIAGNOSIS — D500 Iron deficiency anemia secondary to blood loss (chronic): Secondary | ICD-10-CM | POA: Diagnosis not present

## 2014-05-06 DIAGNOSIS — J441 Chronic obstructive pulmonary disease with (acute) exacerbation: Secondary | ICD-10-CM | POA: Diagnosis not present

## 2014-05-06 DIAGNOSIS — M545 Low back pain: Secondary | ICD-10-CM | POA: Diagnosis not present

## 2014-05-06 DIAGNOSIS — M25551 Pain in right hip: Secondary | ICD-10-CM | POA: Diagnosis not present

## 2014-05-06 DIAGNOSIS — F329 Major depressive disorder, single episode, unspecified: Secondary | ICD-10-CM | POA: Diagnosis not present

## 2014-05-06 DIAGNOSIS — G8929 Other chronic pain: Secondary | ICD-10-CM | POA: Diagnosis not present

## 2014-05-06 DIAGNOSIS — F319 Bipolar disorder, unspecified: Secondary | ICD-10-CM | POA: Diagnosis not present

## 2014-05-06 DIAGNOSIS — M25511 Pain in right shoulder: Secondary | ICD-10-CM | POA: Diagnosis not present

## 2014-05-07 DIAGNOSIS — J441 Chronic obstructive pulmonary disease with (acute) exacerbation: Secondary | ICD-10-CM | POA: Diagnosis not present

## 2014-05-07 DIAGNOSIS — D500 Iron deficiency anemia secondary to blood loss (chronic): Secondary | ICD-10-CM | POA: Diagnosis not present

## 2014-05-07 DIAGNOSIS — M810 Age-related osteoporosis without current pathological fracture: Secondary | ICD-10-CM | POA: Diagnosis not present

## 2014-05-07 DIAGNOSIS — F329 Major depressive disorder, single episode, unspecified: Secondary | ICD-10-CM | POA: Diagnosis not present

## 2014-05-07 DIAGNOSIS — F319 Bipolar disorder, unspecified: Secondary | ICD-10-CM | POA: Diagnosis not present

## 2014-05-08 NOTE — Progress Notes (Signed)
Internal Medicine Clinic Attending  Case discussed with Dr. Patel at the time of the visit.  We reviewed the resident's history and exam and pertinent patient test results.  I agree with the assessment, diagnosis, and plan of care documented in the resident's note.  

## 2014-05-08 NOTE — Addendum Note (Signed)
Addended by: Gilles Chiquito B on: 05/08/2014 04:41 PM   Modules accepted: Level of Service

## 2014-05-09 ENCOUNTER — Telehealth: Payer: Self-pay | Admitting: Licensed Clinical Social Worker

## 2014-05-09 DIAGNOSIS — F329 Major depressive disorder, single episode, unspecified: Secondary | ICD-10-CM | POA: Diagnosis not present

## 2014-05-09 DIAGNOSIS — D500 Iron deficiency anemia secondary to blood loss (chronic): Secondary | ICD-10-CM | POA: Diagnosis not present

## 2014-05-09 DIAGNOSIS — F319 Bipolar disorder, unspecified: Secondary | ICD-10-CM | POA: Diagnosis not present

## 2014-05-09 DIAGNOSIS — M810 Age-related osteoporosis without current pathological fracture: Secondary | ICD-10-CM | POA: Diagnosis not present

## 2014-05-09 DIAGNOSIS — J441 Chronic obstructive pulmonary disease with (acute) exacerbation: Secondary | ICD-10-CM | POA: Diagnosis not present

## 2014-05-09 NOTE — Telephone Encounter (Signed)
CSW returned call to Ms. Degracia.  Pt states she did not receive the [facility] listing that [place] mailed last month.  CSW informed Ms. Hach, will send another listing out in mail today.  Pt inquired if this worker was available to drive pt her around to facilities.  CSW informed pt this worker does not work out in [employer], office only.  Encouraged pt to contact facilities and request information brochures.

## 2014-05-10 DIAGNOSIS — F319 Bipolar disorder, unspecified: Secondary | ICD-10-CM | POA: Diagnosis not present

## 2014-05-11 DIAGNOSIS — F319 Bipolar disorder, unspecified: Secondary | ICD-10-CM | POA: Diagnosis not present

## 2014-05-12 DIAGNOSIS — J449 Chronic obstructive pulmonary disease, unspecified: Secondary | ICD-10-CM | POA: Diagnosis not present

## 2014-05-13 ENCOUNTER — Encounter: Payer: Self-pay | Admitting: *Deleted

## 2014-05-13 DIAGNOSIS — F319 Bipolar disorder, unspecified: Secondary | ICD-10-CM | POA: Diagnosis not present

## 2014-05-13 DIAGNOSIS — D500 Iron deficiency anemia secondary to blood loss (chronic): Secondary | ICD-10-CM | POA: Diagnosis not present

## 2014-05-13 DIAGNOSIS — J441 Chronic obstructive pulmonary disease with (acute) exacerbation: Secondary | ICD-10-CM | POA: Diagnosis not present

## 2014-05-13 DIAGNOSIS — M810 Age-related osteoporosis without current pathological fracture: Secondary | ICD-10-CM | POA: Diagnosis not present

## 2014-05-13 DIAGNOSIS — F329 Major depressive disorder, single episode, unspecified: Secondary | ICD-10-CM | POA: Diagnosis not present

## 2014-05-14 ENCOUNTER — Encounter: Payer: Self-pay | Admitting: Internal Medicine

## 2014-05-14 DIAGNOSIS — F329 Major depressive disorder, single episode, unspecified: Secondary | ICD-10-CM | POA: Diagnosis not present

## 2014-05-14 DIAGNOSIS — D500 Iron deficiency anemia secondary to blood loss (chronic): Secondary | ICD-10-CM | POA: Diagnosis not present

## 2014-05-14 DIAGNOSIS — M810 Age-related osteoporosis without current pathological fracture: Secondary | ICD-10-CM | POA: Diagnosis not present

## 2014-05-14 DIAGNOSIS — F319 Bipolar disorder, unspecified: Secondary | ICD-10-CM | POA: Diagnosis not present

## 2014-05-14 DIAGNOSIS — J441 Chronic obstructive pulmonary disease with (acute) exacerbation: Secondary | ICD-10-CM | POA: Diagnosis not present

## 2014-05-14 NOTE — Progress Notes (Signed)
Patient ID: [name], female   DOB: [birth year], 64 y.o.   MRN: [id number] Talked with pt at home phone number and she states she has set up appts for Wed and Fri at 9:30AM and she is at home at that time. Hilda Blades Thinh Cuccaro RN 05/14/14 9:10AM

## 2014-05-14 NOTE — Progress Notes (Signed)
Patient ID: [name], female   DOB: [birth year], 64 y.o.   MRN: [id number]  I am completing paperwork today from [place] that requires my signature:  Received 05/04/14: Patient did not open door or pick up her phone when PT arrived at her home on 2/17 & 2/19.   Received 05/06/14: PT Plan of Care  -1 visit/week on 04/24/14 -2 visits/week x 6 weeks  We will attempt to reach her from clinic to reiterate to her the importance of working with PT though I suspect she may decline their assistance.

## 2014-05-14 NOTE — Progress Notes (Signed)
Talked with pt at home - has set up appt on Wed and Fri 9:30AM and she is at home at that time.

## 2014-05-15 DIAGNOSIS — F319 Bipolar disorder, unspecified: Secondary | ICD-10-CM | POA: Diagnosis not present

## 2014-05-16 DIAGNOSIS — F329 Major depressive disorder, single episode, unspecified: Secondary | ICD-10-CM | POA: Diagnosis not present

## 2014-05-16 DIAGNOSIS — J441 Chronic obstructive pulmonary disease with (acute) exacerbation: Secondary | ICD-10-CM | POA: Diagnosis not present

## 2014-05-16 DIAGNOSIS — M810 Age-related osteoporosis without current pathological fracture: Secondary | ICD-10-CM | POA: Diagnosis not present

## 2014-05-16 DIAGNOSIS — D500 Iron deficiency anemia secondary to blood loss (chronic): Secondary | ICD-10-CM | POA: Diagnosis not present

## 2014-05-16 DIAGNOSIS — F319 Bipolar disorder, unspecified: Secondary | ICD-10-CM | POA: Diagnosis not present

## 2014-05-19 ENCOUNTER — Encounter: Payer: Self-pay | Admitting: Internal Medicine

## 2014-05-19 NOTE — Progress Notes (Signed)
Patient ID: [name], female   DOB: 17-Mar-1950, 64 y.o.   MRN: [id number]  I am completing paperwork today from [place] that requires my signature:  Received 05/16/14: Home PT orders for therapy once a week starting 04/24/14 followed by twice a week for 6 weeks thereafter  I recall reviewing this paperwork last week and am unsure why I'm receiving this piece of paperwork again but will go ahead and reauthorize it.

## 2014-05-21 ENCOUNTER — Other Ambulatory Visit: Payer: Self-pay | Admitting: Internal Medicine

## 2014-05-21 NOTE — Telephone Encounter (Signed)
As this patient was seen by me on 05/01/14 and deemed necessary for their treatment as documented in the EHR, I will refill this prescription for albuterol inhaler

## 2014-05-22 DIAGNOSIS — M810 Age-related osteoporosis without current pathological fracture: Secondary | ICD-10-CM | POA: Diagnosis not present

## 2014-05-22 DIAGNOSIS — J441 Chronic obstructive pulmonary disease with (acute) exacerbation: Secondary | ICD-10-CM | POA: Diagnosis not present

## 2014-05-22 DIAGNOSIS — J449 Chronic obstructive pulmonary disease, unspecified: Secondary | ICD-10-CM | POA: Diagnosis not present

## 2014-05-22 DIAGNOSIS — F329 Major depressive disorder, single episode, unspecified: Secondary | ICD-10-CM | POA: Diagnosis not present

## 2014-05-22 DIAGNOSIS — D500 Iron deficiency anemia secondary to blood loss (chronic): Secondary | ICD-10-CM | POA: Diagnosis not present

## 2014-05-22 DIAGNOSIS — F319 Bipolar disorder, unspecified: Secondary | ICD-10-CM | POA: Diagnosis not present

## 2014-05-23 DIAGNOSIS — F319 Bipolar disorder, unspecified: Secondary | ICD-10-CM | POA: Diagnosis not present

## 2014-05-23 DIAGNOSIS — M810 Age-related osteoporosis without current pathological fracture: Secondary | ICD-10-CM | POA: Diagnosis not present

## 2014-05-23 DIAGNOSIS — F329 Major depressive disorder, single episode, unspecified: Secondary | ICD-10-CM | POA: Diagnosis not present

## 2014-05-23 DIAGNOSIS — D500 Iron deficiency anemia secondary to blood loss (chronic): Secondary | ICD-10-CM | POA: Diagnosis not present

## 2014-05-23 DIAGNOSIS — J441 Chronic obstructive pulmonary disease with (acute) exacerbation: Secondary | ICD-10-CM | POA: Diagnosis not present

## 2014-05-26 DIAGNOSIS — F319 Bipolar disorder, unspecified: Secondary | ICD-10-CM | POA: Diagnosis not present

## 2014-05-26 DIAGNOSIS — J441 Chronic obstructive pulmonary disease with (acute) exacerbation: Secondary | ICD-10-CM | POA: Diagnosis not present

## 2014-05-26 DIAGNOSIS — M810 Age-related osteoporosis without current pathological fracture: Secondary | ICD-10-CM | POA: Diagnosis not present

## 2014-05-26 DIAGNOSIS — D500 Iron deficiency anemia secondary to blood loss (chronic): Secondary | ICD-10-CM | POA: Diagnosis not present

## 2014-05-26 DIAGNOSIS — F329 Major depressive disorder, single episode, unspecified: Secondary | ICD-10-CM | POA: Diagnosis not present

## 2014-05-27 DIAGNOSIS — D500 Iron deficiency anemia secondary to blood loss (chronic): Secondary | ICD-10-CM | POA: Diagnosis not present

## 2014-05-27 DIAGNOSIS — J441 Chronic obstructive pulmonary disease with (acute) exacerbation: Secondary | ICD-10-CM | POA: Diagnosis not present

## 2014-05-27 DIAGNOSIS — F319 Bipolar disorder, unspecified: Secondary | ICD-10-CM | POA: Diagnosis not present

## 2014-05-27 DIAGNOSIS — F329 Major depressive disorder, single episode, unspecified: Secondary | ICD-10-CM | POA: Diagnosis not present

## 2014-05-27 DIAGNOSIS — M810 Age-related osteoporosis without current pathological fracture: Secondary | ICD-10-CM | POA: Diagnosis not present

## 2014-05-30 DIAGNOSIS — F319 Bipolar disorder, unspecified: Secondary | ICD-10-CM | POA: Diagnosis not present

## 2014-05-30 DIAGNOSIS — D500 Iron deficiency anemia secondary to blood loss (chronic): Secondary | ICD-10-CM | POA: Diagnosis not present

## 2014-05-30 DIAGNOSIS — J441 Chronic obstructive pulmonary disease with (acute) exacerbation: Secondary | ICD-10-CM | POA: Diagnosis not present

## 2014-05-30 DIAGNOSIS — F329 Major depressive disorder, single episode, unspecified: Secondary | ICD-10-CM | POA: Diagnosis not present

## 2014-05-30 DIAGNOSIS — M810 Age-related osteoporosis without current pathological fracture: Secondary | ICD-10-CM | POA: Diagnosis not present

## 2014-06-02 DIAGNOSIS — J441 Chronic obstructive pulmonary disease with (acute) exacerbation: Secondary | ICD-10-CM | POA: Diagnosis not present

## 2014-06-02 DIAGNOSIS — F319 Bipolar disorder, unspecified: Secondary | ICD-10-CM | POA: Diagnosis not present

## 2014-06-02 DIAGNOSIS — D500 Iron deficiency anemia secondary to blood loss (chronic): Secondary | ICD-10-CM | POA: Diagnosis not present

## 2014-06-02 DIAGNOSIS — M810 Age-related osteoporosis without current pathological fracture: Secondary | ICD-10-CM | POA: Diagnosis not present

## 2014-06-02 DIAGNOSIS — F329 Major depressive disorder, single episode, unspecified: Secondary | ICD-10-CM | POA: Diagnosis not present

## 2014-06-03 DIAGNOSIS — M810 Age-related osteoporosis without current pathological fracture: Secondary | ICD-10-CM | POA: Diagnosis not present

## 2014-06-03 DIAGNOSIS — F319 Bipolar disorder, unspecified: Secondary | ICD-10-CM | POA: Diagnosis not present

## 2014-06-03 DIAGNOSIS — F329 Major depressive disorder, single episode, unspecified: Secondary | ICD-10-CM | POA: Diagnosis not present

## 2014-06-03 DIAGNOSIS — D500 Iron deficiency anemia secondary to blood loss (chronic): Secondary | ICD-10-CM | POA: Diagnosis not present

## 2014-06-03 DIAGNOSIS — J441 Chronic obstructive pulmonary disease with (acute) exacerbation: Secondary | ICD-10-CM | POA: Diagnosis not present

## 2014-06-04 DIAGNOSIS — M810 Age-related osteoporosis without current pathological fracture: Secondary | ICD-10-CM | POA: Diagnosis not present

## 2014-06-04 DIAGNOSIS — M545 Low back pain: Secondary | ICD-10-CM | POA: Diagnosis not present

## 2014-06-04 DIAGNOSIS — J441 Chronic obstructive pulmonary disease with (acute) exacerbation: Secondary | ICD-10-CM | POA: Diagnosis not present

## 2014-06-04 DIAGNOSIS — M25552 Pain in left hip: Secondary | ICD-10-CM | POA: Diagnosis not present

## 2014-06-04 DIAGNOSIS — G8929 Other chronic pain: Secondary | ICD-10-CM | POA: Diagnosis not present

## 2014-06-04 DIAGNOSIS — F329 Major depressive disorder, single episode, unspecified: Secondary | ICD-10-CM | POA: Diagnosis not present

## 2014-06-04 DIAGNOSIS — F319 Bipolar disorder, unspecified: Secondary | ICD-10-CM | POA: Diagnosis not present

## 2014-06-04 DIAGNOSIS — M25561 Pain in right knee: Secondary | ICD-10-CM | POA: Diagnosis not present

## 2014-06-04 DIAGNOSIS — M25551 Pain in right hip: Secondary | ICD-10-CM | POA: Diagnosis not present

## 2014-06-04 DIAGNOSIS — D500 Iron deficiency anemia secondary to blood loss (chronic): Secondary | ICD-10-CM | POA: Diagnosis not present

## 2014-06-11 DIAGNOSIS — D500 Iron deficiency anemia secondary to blood loss (chronic): Secondary | ICD-10-CM | POA: Diagnosis not present

## 2014-06-11 DIAGNOSIS — F319 Bipolar disorder, unspecified: Secondary | ICD-10-CM | POA: Diagnosis not present

## 2014-06-11 DIAGNOSIS — J441 Chronic obstructive pulmonary disease with (acute) exacerbation: Secondary | ICD-10-CM | POA: Diagnosis not present

## 2014-06-11 DIAGNOSIS — M810 Age-related osteoporosis without current pathological fracture: Secondary | ICD-10-CM | POA: Diagnosis not present

## 2014-06-11 DIAGNOSIS — F329 Major depressive disorder, single episode, unspecified: Secondary | ICD-10-CM | POA: Diagnosis not present

## 2014-06-12 DIAGNOSIS — J449 Chronic obstructive pulmonary disease, unspecified: Secondary | ICD-10-CM | POA: Diagnosis not present

## 2014-06-13 ENCOUNTER — Ambulatory Visit (HOSPITAL_COMMUNITY)
Admission: RE | Admit: 2014-06-13 | Discharge: 2014-06-13 | Disposition: A | Discharge status: Home / Self Care | Payer: Medicare Other | Source: Ambulatory Visit | Attending: Internal Medicine | Admitting: Internal Medicine

## 2014-06-13 ENCOUNTER — Ambulatory Visit (INDEPENDENT_AMBULATORY_CARE_PROVIDER_SITE_OTHER): Payer: Medicare Other | Admitting: Internal Medicine

## 2014-06-13 ENCOUNTER — Encounter: Payer: Self-pay | Admitting: Internal Medicine

## 2014-06-13 VITALS — BP 153/70 | HR 78 | Temp 98.3°F | Ht 61.0 in | Wt 86.3 lb

## 2014-06-13 DIAGNOSIS — J302 Other seasonal allergic rhinitis: Secondary | ICD-10-CM

## 2014-06-13 DIAGNOSIS — R1110 Vomiting, unspecified: Secondary | ICD-10-CM | POA: Insufficient documentation

## 2014-06-13 DIAGNOSIS — M810 Age-related osteoporosis without current pathological fracture: Secondary | ICD-10-CM | POA: Diagnosis not present

## 2014-06-13 DIAGNOSIS — J441 Chronic obstructive pulmonary disease with (acute) exacerbation: Secondary | ICD-10-CM | POA: Diagnosis not present

## 2014-06-13 DIAGNOSIS — F329 Major depressive disorder, single episode, unspecified: Secondary | ICD-10-CM | POA: Diagnosis not present

## 2014-06-13 DIAGNOSIS — G8929 Other chronic pain: Secondary | ICD-10-CM | POA: Diagnosis not present

## 2014-06-13 DIAGNOSIS — R1033 Periumbilical pain: Secondary | ICD-10-CM | POA: Insufficient documentation

## 2014-06-13 DIAGNOSIS — J449 Chronic obstructive pulmonary disease, unspecified: Secondary | ICD-10-CM | POA: Diagnosis not present

## 2014-06-13 DIAGNOSIS — R112 Nausea with vomiting, unspecified: Secondary | ICD-10-CM | POA: Diagnosis not present

## 2014-06-13 DIAGNOSIS — R197 Diarrhea, unspecified: Secondary | ICD-10-CM | POA: Insufficient documentation

## 2014-06-13 DIAGNOSIS — K3189 Other diseases of stomach and duodenum: Secondary | ICD-10-CM | POA: Diagnosis not present

## 2014-06-13 DIAGNOSIS — D500 Iron deficiency anemia secondary to blood loss (chronic): Secondary | ICD-10-CM | POA: Diagnosis not present

## 2014-06-13 DIAGNOSIS — F319 Bipolar disorder, unspecified: Secondary | ICD-10-CM | POA: Diagnosis not present

## 2014-06-13 MED ORDER — FLUTICASONE PROPIONATE 50 MCG/ACT NA SUSP: 1.0000 | Freq: Every day | NASAL | Status: AC

## 2014-06-13 NOTE — Assessment & Plan Note (Addendum)
She is requesting stronger COPD medications for her nebulizer.  She reports increased rescue inhaler use (once per day now, increased from once every few days).  She reports compliance with 3L oxygen, Advair and nebs QID.  She has been evaluated by Dr. Gwenette Greet previously but never got PFTs or returned to see him.  Lungs fairly clear today with mild wheeze in right lung base, no respiratory distress.  SpO2 is 97% on 3L of oxygen.  - refer back to Dr. Gwenette Greet for PFTs and further recommendations (his note mentioned consideration of lung transplant) - patient advised to quit smoking - continue supplemental oxygen and bronchodilators as prescribed - she is agreeable to above plan

## 2014-06-13 NOTE — Assessment & Plan Note (Addendum)
During ROS she admits to vomiting about three times weekly.  It is not associated with diarrhea, abdominal pain or eating.  She says this has been going on for about six months.  Notes in EPIC suggest previous nausea may be due to chronic opioid use or progressive COPD.  She was evaluated by GI who suggested GES.  Her abdominal exam today is c/w reducible abdominal hernia and she is tender in lower quadrants with hyperactive BS.  She reports normal BM this morning, she ate a breakfast and has not vomited today. - CMP - abdominal xray given her tenderness on exam and vomiting hx - referral back to GI if above unrevealing

## 2014-06-13 NOTE — Progress Notes (Signed)
Subjective:    Patient ID: [name], female    DOB: [birth year], 64 y.o.   MRN: [id number]  HPI Comments: [name] is a 64 year old woman with PMH as below here for follow-up.  Please see problem based charting for details.        Past Medical History  Diagnosis Date  . Depression   . Suicidal ideation 2007     attempted overdose 2012  Dr. Tivis Ringer report  . Bipolar disorder   . Substance abuse      narcotics, alcohol, tobacco  . Chronic pain syndrome     follows at [facility]  . DEFICIENCY, VITAMIN D NOS 01/03/2007  . Chronic lower back pain   . Anxiety   . OSTEOPOROSIS 06/17/2009    DEXA 05/2009 : L femur -2.9; R femur -2.5. Alendronate on med list but not taking. Needs addressed ASAP as h/o fractures.    . Elevated liver function tests   . On home oxygen therapy     "3L; 24/7" (03/20/2014)  . Shortness of breath     "all the time" (02/27/2013)  . COPD (chronic obstructive pulmonary disease)     oxygen dependent  . Sciatic pain   . Anemia   . Pneumonia X 1?  . Hyperthyroidism     "borderline"  . History of blood transfusion     "getting my 1st later tonight" (03/20/2014)  . Arthritis     "legs, back; hips, primarily left hip" (03/20/2014)    Review of Systems  Constitutional: Positive for chills. Negative for fever and appetite change.  HENT: Positive for congestion, postnasal drip, rhinorrhea and sneezing. Negative for sore throat.   Respiratory: Positive for shortness of breath and wheezing. Negative for cough.        She feels dyspnea is exacerbated by allergies.  Cardiovascular: Negative for chest pain, palpitations and leg swelling.  Gastrointestinal: Positive for nausea, vomiting and diarrhea. Negative for abdominal pain and blood in stool.       Reports vomiting three times weekly for past six months.  Non bloody. Occasional diarrhea that is not associated with vomiting or occuring on same days as vomiting.   Genitourinary: Negative for  dysuria and hematuria.       Filed Vitals:   06/13/14 1341  BP: 153/70  Pulse: 78  Temp: 98.3 F (36.8 C)  TempSrc: Oral  Height: 5\' 1"  (1.549 m)  Weight: 86 lb 4.8 oz (39.145 kg)  SpO2: 97%   Objective:   Physical Exam  Constitutional: She is oriented to person, place, and time. No distress.  Very thin.  HENT:  Head: Normocephalic and atraumatic.  Mouth/Throat: Oropharynx is clear and moist. No oropharyngeal exudate.  Eyes: EOM are normal. Pupils are equal, round, and reactive to light.  Neck: Neck supple.  Cardiovascular: Normal rate, regular rhythm and normal heart sounds.  Exam reveals no gallop and no friction rub.   No murmur heard. Pulmonary/Chest: Effort normal and breath sounds normal. No respiratory distress. She has no wheezes. She has no rales.  Abdominal: Soft. She exhibits no distension and no mass. There is tenderness. There is no rebound and no guarding.  Hyperactive BS, + periumbilical hernia (reducible), she is tender in lower quadrants, no HSM.  Musculoskeletal: Normal range of motion. She exhibits no edema or tenderness.  Neurological: She is alert and oriented to person, place, and time. No cranial nerve deficit.  Skin: Skin is warm. She is not diaphoretic.  Psychiatric: She has a normal mood and affect. Her behavior is normal.  Vitals reviewed.         Assessment & Plan:  Please see problem based assessment and plan.

## 2014-06-13 NOTE — Assessment & Plan Note (Signed)
Patient c/o of rhinorrhea, post-nasal drip, itchy/watery eyes she feels is worse in spring and summer.  She take OTC Claritin.  Prior notes indicate she was on Flonase in the past but I don't see this on her med list.   - rx for Flonase - continue Claritin

## 2014-06-13 NOTE — Assessment & Plan Note (Signed)
She wants referral to new pain clinic because of having to wait hours at pain clinic and she say she was accused of being a drug addict and selling pain meds "without proof" because she was short on her pills. She said she must supply three affidavits signed and notarized from her pastor, breast friend and herself saying that she is not selling her pain meds. She is willing to provide these affidavits but wants to go to new clinic. I explained that it would be difficult to switch clinics and advised that she stay with current clinic.  She is agreeable to this and has appt scheduled for next week which she intends to keep.

## 2014-06-13 NOTE — Patient Instructions (Signed)
1. I am referring you back to Dr. Gwenette Greet (lung doctor) for further management of your COPD.  Your lungs sound okay today and your oxygen level is good.  Keep taking the medications like you have been.  I have prescribed Flonase for your allergy symptoms.  I am getting labs and sending you for an xray of your abdomen because of your vomiting.  I will call you if there are problems with the labs.  If we can't find answers you may need to see a gastroenterologist.   2. Please take all medications as prescribed.    3. If you have worsening of your symptoms or new symptoms arise, please call the clinic ([phone]), or go to the ER immediately if symptoms are severe.  Fluticasone nasal spray (Flonase) What is this medicine? FLUTICASONE (floo TIK a sone) is a corticosteroid. This medicine is used to treat the symptoms of allergies like sneezing, itchy red eyes, and itchy, runny, or stuffy nose. This medicine may be used for other purposes; ask your health care provider or pharmacist if you have questions. COMMON BRAND NAME(S): Flonase, Flonase Allergy Relief, Veramyst What should I tell my health care provider before I take this medicine? They need to know if you have any of these conditions: -infection, like tuberculosis, herpes, or fungal infection -recent surgery on nose or sinuses -taking corticosteroid by mouth -an unusual or allergic reaction to fluticasone, steroids, other medicines, foods, dyes, or preservatives -pregnant or trying to get pregnant -breast-feeding How should I use this medicine? This medicine is for use in the nose. Follow the directions on your product or prescription label. This medicine works best if used at regular intervals. Do not use more often than directed. Make sure that you are using your nasal spray correctly. After 6 months of daily use without a prescription, talk to your doctor or health care professional before using it for a longer time. Ask your doctor or  health care professional if you have any questions. Talk to your pediatrician regarding the use of this medicine in children. While this drug may be used for children as young as 37 years old for selected conditions, precautions do apply. After 2 months of daily use without a prescription in a child, talk to your pediatrician before using it for a longer time. Overdosage: If you think you have taken too much of this medicine contact a poison control center or emergency room at once. NOTE: This medicine is only for you. Do not share this medicine with others. What if I miss a dose? If you miss a dose, use it as soon as you remember. If it is almost time for your next dose, use only that dose and continue with your regular schedule. Do not use double or extra doses. What may interact with this medicine? -ketoconazole -metyrapone -some medicines for HIV -vaccines This list may not describe all possible interactions. Give your health care provider a list of all the medicines, herbs, non-prescription drugs, or dietary supplements you use. Also tell them if you smoke, drink alcohol, or use illegal drugs. Some items may interact with your medicine. What should I watch for while using this medicine? Visit your doctor or health care professional for regular checks on your progress. Some symptoms may improve within 12 hours after starting use. Check with your doctor or health care professional if there is no improvement in your condition after 3 weeks of use. Do not come in contact with people who have chickenpox or  the measles while you are taking this medicine. If you do, call your doctor right away. What side effects may I notice from receiving this medicine? Side effects that you should report to your doctor or health care professional as soon as possible: -allergic reactions like skin rash, itching or hives, swelling of the face, lips, or tongue -changes in vision -flu-like symptoms -white patches or  sores in the mouth or nose Side effects that usually do not require medical attention (report to your doctor or health care professional if they continue or are bothersome): -burning or irritation inside the nose or throat -cough -headache -nosebleed -unusual taste or smell This list may not describe all possible side effects. Call your doctor for medical advice about side effects. You may report side effects to FDA at 1-800-FDA-1088. Where should I keep my medicine? Keep out of the reach of children. Store at room temperature between 15 and 30 degrees C (59 and 86 degrees F). Throw away any unused medicine after the expiration date. NOTE: This sheet is a summary. It may not cover all possible information. If you have questions about this medicine, talk to your doctor, pharmacist, or health care provider.  2015, [employer]vier/Gold Standard. (2013-06-20 15:55:20)

## 2014-06-14 LAB — COMPLETE METABOLIC PANEL WITH GFR
ALK PHOS: 101 U/L (ref 39–117)
ALT: 13 U/L (ref 0–35)
AST: 16 U/L (ref 0–37)
Albumin: 4.5 g/dL (ref 3.5–5.2)
BUN: 15 mg/dL (ref 6–23)
CHLORIDE: 101 meq/L (ref 96–112)
CO2: 27 mEq/L (ref 19–32)
Calcium: 8.7 mg/dL (ref 8.4–10.5)
Creat: 0.47 mg/dL — ABNORMAL LOW (ref 0.50–1.10)
GFR, Est African American: >89 mL/min
GFR, Est Non African American: >89 mL/min
GLUCOSE: 80 mg/dL (ref 70–99)
Potassium: 4.7 mEq/L (ref 3.5–5.3)
SODIUM: 143 meq/L (ref 135–145)
TOTAL PROTEIN: 7.1 g/dL (ref 6.0–8.3)
Total Bilirubin: 0.2 mg/dL (ref 0.2–1.2)

## 2014-06-16 ENCOUNTER — Other Ambulatory Visit: Payer: Self-pay | Admitting: Internal Medicine

## 2014-06-16 DIAGNOSIS — R112 Nausea with vomiting, unspecified: Secondary | ICD-10-CM

## 2014-06-16 DIAGNOSIS — Z1211 Encounter for screening for malignant neoplasm of colon: Secondary | ICD-10-CM

## 2014-06-16 NOTE — Progress Notes (Signed)
Internal Medicine Clinic Attending  Case discussed with Dr. Wilson soon after the resident saw the patient.  We reviewed the resident's history and exam and pertinent patient test results.  I agree with the assessment, diagnosis, and plan of care documented in the resident's note.  

## 2014-06-17 DIAGNOSIS — J449 Chronic obstructive pulmonary disease, unspecified: Secondary | ICD-10-CM | POA: Diagnosis not present

## 2014-06-18 DIAGNOSIS — M25551 Pain in right hip: Secondary | ICD-10-CM | POA: Diagnosis not present

## 2014-06-18 DIAGNOSIS — G8929 Other chronic pain: Secondary | ICD-10-CM | POA: Diagnosis not present

## 2014-06-18 DIAGNOSIS — M25552 Pain in left hip: Secondary | ICD-10-CM | POA: Diagnosis not present

## 2014-06-18 DIAGNOSIS — M545 Low back pain: Secondary | ICD-10-CM | POA: Diagnosis not present

## 2014-06-21 DIAGNOSIS — F319 Bipolar disorder, unspecified: Secondary | ICD-10-CM | POA: Diagnosis not present

## 2014-06-22 DIAGNOSIS — F319 Bipolar disorder, unspecified: Secondary | ICD-10-CM | POA: Diagnosis not present

## 2014-06-23 DIAGNOSIS — F319 Bipolar disorder, unspecified: Secondary | ICD-10-CM | POA: Diagnosis not present

## 2014-06-24 ENCOUNTER — Encounter: Payer: Self-pay | Admitting: *Deleted

## 2014-06-24 DIAGNOSIS — F319 Bipolar disorder, unspecified: Secondary | ICD-10-CM | POA: Diagnosis not present

## 2014-06-25 ENCOUNTER — Encounter: Payer: Self-pay | Admitting: Internal Medicine

## 2014-06-25 DIAGNOSIS — F319 Bipolar disorder, unspecified: Secondary | ICD-10-CM | POA: Diagnosis not present

## 2014-06-25 NOTE — Progress Notes (Signed)
Patient ID: [name], female   DOB: 1950-07-02, 63 y.o.   MRN: [id number]  I am completing paperwork today from [facility] that requires my signature:  Received 06/16/14: Discharge from physical therapy given successful completion of goals over the last 13 visits

## 2014-06-26 ENCOUNTER — Emergency Department (HOSPITAL_COMMUNITY): Payer: Medicare Other

## 2014-06-26 ENCOUNTER — Encounter (HOSPITAL_COMMUNITY): Payer: Self-pay | Admitting: Emergency Medicine

## 2014-06-26 ENCOUNTER — Observation Stay (HOSPITAL_COMMUNITY)
Admission: EM | Admit: 2014-06-26 | Discharge: 2014-06-28 | Disposition: A | Discharge status: Home / Self Care | Payer: Medicare Other | Attending: Internal Medicine | Admitting: Internal Medicine

## 2014-06-26 DIAGNOSIS — Z91018 Allergy to other foods: Secondary | ICD-10-CM | POA: Insufficient documentation

## 2014-06-26 DIAGNOSIS — G8929 Other chronic pain: Secondary | ICD-10-CM | POA: Insufficient documentation

## 2014-06-26 DIAGNOSIS — Z888 Allergy status to other drugs, medicaments and biological substances status: Secondary | ICD-10-CM | POA: Diagnosis not present

## 2014-06-26 DIAGNOSIS — Z79899 Other long term (current) drug therapy: Secondary | ICD-10-CM | POA: Diagnosis not present

## 2014-06-26 DIAGNOSIS — R0602 Shortness of breath: Secondary | ICD-10-CM | POA: Diagnosis not present

## 2014-06-26 DIAGNOSIS — J449 Chronic obstructive pulmonary disease, unspecified: Secondary | ICD-10-CM | POA: Diagnosis not present

## 2014-06-26 DIAGNOSIS — J302 Other seasonal allergic rhinitis: Secondary | ICD-10-CM | POA: Diagnosis present

## 2014-06-26 DIAGNOSIS — K219 Gastro-esophageal reflux disease without esophagitis: Secondary | ICD-10-CM | POA: Insufficient documentation

## 2014-06-26 DIAGNOSIS — J441 Chronic obstructive pulmonary disease with (acute) exacerbation: Principal | ICD-10-CM | POA: Insufficient documentation

## 2014-06-26 DIAGNOSIS — M810 Age-related osteoporosis without current pathological fracture: Secondary | ICD-10-CM | POA: Diagnosis not present

## 2014-06-26 DIAGNOSIS — J45909 Unspecified asthma, uncomplicated: Secondary | ICD-10-CM | POA: Diagnosis not present

## 2014-06-26 DIAGNOSIS — R531 Weakness: Secondary | ICD-10-CM | POA: Diagnosis not present

## 2014-06-26 DIAGNOSIS — E785 Hyperlipidemia, unspecified: Secondary | ICD-10-CM | POA: Insufficient documentation

## 2014-06-26 DIAGNOSIS — F17210 Nicotine dependence, cigarettes, uncomplicated: Secondary | ICD-10-CM | POA: Diagnosis not present

## 2014-06-26 DIAGNOSIS — F319 Bipolar disorder, unspecified: Secondary | ICD-10-CM | POA: Diagnosis not present

## 2014-06-26 DIAGNOSIS — M1990 Unspecified osteoarthritis, unspecified site: Secondary | ICD-10-CM | POA: Insufficient documentation

## 2014-06-26 LAB — BASIC METABOLIC PANEL
ANION GAP: 11 (ref 5–15)
BUN: 12 mg/dL (ref 6–23)
CHLORIDE: 98 mmol/L (ref 96–112)
CO2: 32 mmol/L (ref 19–32)
Calcium: 8 mg/dL — ABNORMAL LOW (ref 8.4–10.5)
Creatinine, Ser: 0.34 mg/dL — ABNORMAL LOW (ref 0.50–1.10)
GFR calc non Af Amer: >90 mL/min (ref >90)
GLUCOSE: 112 mg/dL — AB (ref 70–99)
Potassium: 4.3 mmol/L (ref 3.5–5.1)
Sodium: 141 mmol/L (ref 135–145)

## 2014-06-26 LAB — CBC WITH DIFFERENTIAL/PLATELET
Basophils Absolute: 0 10*3/uL (ref 0.0–0.1)
Basophils Relative: 0 % (ref 0–1)
EOS ABS: 0 10*3/uL (ref 0.0–0.7)
EOS PCT: 0 % (ref 0–5)
HCT: 32.6 % — ABNORMAL LOW (ref 36.0–46.0)
Hemoglobin: 9.8 g/dL — ABNORMAL LOW (ref 12.0–15.0)
Lymphocytes Relative: 3 % — ABNORMAL LOW (ref 12–46)
Lymphs Abs: 0.2 10*3/uL — ABNORMAL LOW (ref 0.7–4.0)
MCH: 30 pg (ref 26.0–34.0)
MCHC: 30.1 g/dL (ref 30.0–36.0)
MCV: 99.7 fL (ref 78.0–100.0)
MONO ABS: 0 10*3/uL — AB (ref 0.1–1.0)
Monocytes Relative: 0 % — ABNORMAL LOW (ref 3–12)
Neutro Abs: 7.5 10*3/uL (ref 1.7–7.7)
Neutrophils Relative %: 97 % — ABNORMAL HIGH (ref 43–77)
PLATELETS: 541 10*3/uL — AB (ref 150–400)
RBC: 3.27 MIL/uL — ABNORMAL LOW (ref 3.87–5.11)
RDW: 16.9 % — AB (ref 11.5–15.5)
WBC: 7.8 10*3/uL (ref 4.0–10.5)

## 2014-06-26 LAB — I-STAT TROPONIN, ED: TROPONIN I, POC: 0.01 ng/mL (ref 0.00–0.08)

## 2014-06-26 LAB — BRAIN NATRIURETIC PEPTIDE: B Natriuretic Peptide: 139.5 pg/mL — ABNORMAL HIGH (ref 0.0–100.0)

## 2014-06-26 MED ORDER — ALBUTEROL (5 MG/ML) CONTINUOUS INHALATION SOLN
10.0000 mg/h | INHALATION_SOLUTION | RESPIRATORY_TRACT | Status: DC
  Administered 2014-06-26: 10 mg/h via RESPIRATORY_TRACT
  Filled 2014-06-26: qty 20

## 2014-06-26 MED ORDER — METHYLPREDNISOLONE SODIUM SUCC 125 MG IJ SOLR: 125.0000 mg | Freq: Once | INTRAMUSCULAR | Status: DC

## 2014-06-26 MED ORDER — OXYCODONE HCL 5 MG PO TABS
15.0000 mg | ORAL_TABLET | Freq: Four times a day (QID) | ORAL | Status: DC | PRN
  Administered 2014-06-26 – 2014-06-28 (×5): 15 mg via ORAL
  Filled 2014-06-26 (×6): qty 3

## 2014-06-26 MED ORDER — IPRATROPIUM BROMIDE 0.02 % IN SOLN
1.0000 mg | Freq: Once | RESPIRATORY_TRACT | Status: AC
  Administered 2014-06-26: 1 mg via RESPIRATORY_TRACT
  Filled 2014-06-26: qty 5

## 2014-06-26 NOTE — H&P (Signed)
Date: 06/27/2014               Patient Name:  [name] MRN: [id number]  DOB: 03-May-1950 Age / Sex: 64 y.o., female   PCP: Riccardo Dubin, MD         Medical Service: Internal Medicine Teaching Service         Attending Physician: Dr. Aldine Contes, MD    First Contact: Dr. Lottie Mussel Pager: [phone]  Second Contact: Dr. Michail Jewels Pager: [phone]       After Hours (After 5p/  First Contact Pager: [phone]  weekends / holidays): Second Contact Pager: [phone]   Chief Complaint: Shortness of breath for 1 week  History of Present Illness: [name] is a 64 yo woman with a history of end-stage COPD, iron-deficiency anemia, bipolar disorder and osteoporosis who presented to the [facility] ED with shortness of breath. She first noticed the shortness of breath one week ago along with the development of a cough, congestion and weakness. The shortness of breath was worst when she was active, for example, when she was cooking or moving around her apartment. She normally uses 3 L O2 [place] at home and takes nebulizer treatments four times per day; this week, however, she needed 6 nebulizer treatments each day. Her cough was productive of brown sputum. She denies subjective fever or chills.  She has had ~2 hospitalizations for COPD per year over the past few years; they tend to be more common in the winter months. Last admission was for symptomatic anemia in 03/2014. She has been seen by Dr. Gwenette Greet (pulmonology), but PFTs have not yet been completed. She is currently smoking half a pack of cigarettes per day, which is decreased from 2 packs per day.  Meds: Prescriptions prior to admission  Medication Sig Dispense Refill Last Dose  . albuterol (PROVENTIL) (2.5 MG/3ML) 0.083% nebulizer solution Take 3 mLs (2.5 mg total) by nebulization every 4 (four) hours. Shortness of breath 360 mL 12 06/26/2014 at Unknown time  . cholecalciferol (VITAMIN D) 1000 UNITS tablet Take 1,000 Units by  mouth daily.   06/26/2014 at Unknown time  . diphenhydrAMINE (BENADRYL) 25 MG tablet Take 25 mg by mouth as needed.    06/26/2014 at Unknown time  . DULoxetine (CYMBALTA) 30 MG capsule Take 30 mg by mouth 2 (two) times daily.    06/26/2014 at Unknown time  . fluticasone (FLONASE) 50 MCG/ACT nasal spray Place 1 spray into both nostrils daily. 16 g 1 06/26/2014 at Unknown time  . Fluticasone-Salmeterol (ADVAIR DISKUS) 250-50 MCG/DOSE AEPB INHALE 1 PUFF INTO THE LUNGS 2 TIMES DAILY 5 each 6 06/26/2014 at Unknown time  . ipratropium (ATROVENT) 0.02 % nebulizer solution Take 2.5 mLs (0.5 mg total) by nebulization 2 (two) times daily. 150 mL 12 06/26/2014 at Unknown time  . loratadine (CLARITIN) 10 MG tablet Take 10 mg by mouth daily.   06/26/2014 at Unknown time  . oxyCODONE (ROXICODONE) 15 MG immediate release tablet Take 15 mg by mouth every 6 (six) hours as needed for pain.   06/26/2014 at Unknown time  . PROAIR HFA 108 (90 BASE) MCG/ACT inhaler INHALE ONE PUFF BY MOUTH EVERY 2 TO 4 HOURS AS NEEDED FOR WHEEZING 8.5 g 12 06/26/2014 at Unknown time    Allergies: Allergies as of 06/26/2014 - Review Complete 06/26/2014  Allergen Reaction Noted  . Banana Shortness Of Breath and Nausea And Vomiting 01/10/2012  . Lyrica [pregabalin]  06/26/2014  . Pollen extract Other (  See Comments) 11/20/2012   Past Medical History  Diagnosis Date  . Depression   . Suicidal ideation 2007     attempted overdose 2012  Dr. Tivis Ringer report  . Bipolar disorder   . Substance abuse      narcotics, alcohol, tobacco  . Chronic pain syndrome     follows at [facility]  . DEFICIENCY, VITAMIN D NOS 01/03/2007  . Chronic lower back pain   . Anxiety   . OSTEOPOROSIS 06/17/2009    DEXA 05/2009 : L femur -2.9; R femur -2.5. Alendronate on med list but not taking. Needs addressed ASAP as h/o fractures.    . Elevated liver function tests   . On home oxygen therapy     "3L; 24/7" (03/20/2014)  . Shortness of breath     "all  the time" (02/27/2013)  . COPD (chronic obstructive pulmonary disease)     oxygen dependent  . Sciatic pain   . Anemia   . Pneumonia X 1?  . Hyperthyroidism     "borderline"  . History of blood transfusion     "getting my 1st later tonight" (03/20/2014)  . Arthritis     "legs, back; hips, primarily left hip" (03/20/2014)   Past Surgical History  Procedure Laterality Date  . Orif ankle fracture Left 10/2010  . Cesarean section  1974; 1979  . Tubal ligation  1979  . Augmentation mammaplasty  ~ 2007  . Fracture surgery    . Ankle debridement Left 10/2010   Family History  Problem Relation Age of Onset  . Stroke Neg Hx   . Cancer Neg Hx   . Myelodysplastic syndrome Father     Died from myelofibrosis though diagnosed post-mortem   History   Social History  . Marital Status: Divorced    Spouse Name: N/A  . Number of Children: 2  . Years of Education: N/A   Occupational History  . retired    Social History Main Topics  . Smoking status: Current Every Day Smoker -- 0.20 packs/day for 45 years    Types: Cigarettes  . Smokeless tobacco: Never Used     Comment: 03/20/2014 "smoking 2 cigarettes/day; that's down from 2 ppd"  . Alcohol Use: 0.0 oz/week    0 Standard drinks or equivalent per week     Comment: 03/20/2014 "nothing in 7 years; never had a problem w/it"  . Drug Use: No  . Sexual Activity: No   Other Topics Concern  . Not on file   Social History Narrative   [name] is [employer] # listed    2 kids    Still smoking <1ppd     Review of Systems: General: weak, short of breath over the past week HEENT: no headaches or changes in vision Cardiac: left chest pain over the past month that is constant, "jagged" in quality, pleuritic and worsened by movement Respiratory: GI: nausea and vomiting for the past year; it was improved for a short time about 6 months ago, but has resumed along with periumbilical abdominal pain. Last BM 8 days ago Urinary: no dysuria or  hematuria Msk: pain and redness on left wrist today Psychiatric: history of bipolar disorder, well-managed on home medications   Physical Exam: Blood pressure 151/77, pulse 77, temperature 98.7 F (37.1 C), temperature source Oral, resp. rate 20, height 5\' 1"  (1.549 m), weight 91 lb (41.277 kg), SpO2 92 %. Appearance: thin woman in NAD, breathing comfortably on 4L O2 [place] HEENT: AT/[place], PERRL, EOMi,  glasses in place Heart: RRR, normal S1S2, no murmurs, pain on palpation of the chest Lungs: very loud wheezing diffusely, inspiratory<expiratory, normal work of breathing, occasional cough Abdomen: BS present, soft, nontender Musculoskeletal: normal range of motion, slightly tender, erythematous, swollen 1"x1" area on left wrist, able to move wrist in all directions, no weakness or numbness Extremities: no edema Neurologic: A&Ox3, grossly intact Psychiatric: appears anxious, but no depressed mood, no active hallucinations  Lab results: Basic Metabolic Panel:  Recent Labs  06/26/14 1941  NA 141  K 4.3  CL 98  CO2 32  GLUCOSE 112*  BUN 12  CREATININE 0.34*  CALCIUM 8.0*   CBC:  Recent Labs  06/26/14 2329  WBC 7.8  NEUTROABS 7.5  HGB 9.8*  HCT 32.6*  MCV 99.7  PLT 541*   Urine Drug Screen: Drugs of Abuse     Component Value Date/Time   LABOPIA NONE DETECTED 01/10/2012 1339   COCAINSCRNUR NONE DETECTED 01/10/2012 1339   LABBENZ NONE DETECTED 01/10/2012 1339   AMPHETMU NONE DETECTED 01/10/2012 1339   THCU NONE DETECTED 01/10/2012 1339   LABBARB NONE DETECTED 01/10/2012 1339     Imaging results:  Dg Chest 2 View  06/26/2014   CLINICAL DATA:  Increasing shortness of breath for 4 days. History of COPD.  EXAM: CHEST  2 VIEW  COMPARISON:  05/01/2014  FINDINGS: Hyperinflation with severe apical predominant emphysema. Cardiomediastinal contours are normal. No pulmonary edema, consolidation, pleural effusion or pneumothorax. Unchanged scoliotic curvature of spine.  IMPRESSION:  Emphysema without acute process.   Electronically Signed   By: Jeb Levering M.D.   On: 06/26/2014 20:19    Other results: EKG: Rate 82, NSR, baseline wander, TWI unchanged from priors  Assessment & Plan by Problem: Principal Problem:   COPD exacerbation Active Problems:   Bronchitis, allergic   Seasonal allergies   Dyslipidemia   Chronic pain   GERD (gastroesophageal reflux disease)  COPD Exacerbation: Patient has had ~2 hospitalizations for COPD exacerbation per year over the past few years; they tend to be more common in the winter months. Episode of desaturation to 85% in ED while on 3L O2 [place] (home dose). Currently has loud expiratory wheezes, is requiring more oxygen than at home (4 L). Likely brought on by URI. Her chest pain appears to be musculoskeletal. - Duonebs q4 hours - Albuterol inhaler q4 hours PRN - Dulera 2 puffs BID - Solumedrol 125 mg given on night of admission; will likely need continued IV antibiotics tomorrow - Levaquin per pharmacy - Mucinex for cough - Cardiac monitoring - Supplemental O2 - One troponin pending  Seasonal Allergies: Active currently. - Benadryl 25 mg PRN - Flonase 1 spray each nare - Claritin 10 mg daily  Nausea and Constipation: Last BM 8 days ago. Nausea and periumbilical abdominal pain on and off over the past year. May be due to chronic opioid use or advancing pulmonary problems. Has been seen by Dr. Sharlett Iles (GI), who suggested starting PPI; this is not currently on her medication list. GI notes state she is not a candidate for EGD or colonoscopy due to the severity of her COPD. - Zofran 4 mg IV q6 hours PRN - Protonix 40 mg daily trial - Dulcolax suppository PRN - Can consider outpatient gastric emptying study (suggested by GI in prior notes)  Iron Deficiency Anemia: Hemoglobin currently 9.8, which is near baseline. Hospitalization in 03/2014 for hemoglobin of 5.2.  Osteoarthritis and Chronic Pain: - Oxy IR 15 mg q6 hours  PRN  Bipolar Disorder: Need to straighten out home medications, as patient believes she takes Zyprexa (but does not know dose). Has a history of auditory hallucinations which have instructed her to harm herself in past; no active depression or symptoms. - Continue cymbalta 30 mg BID (home medication)  Osteoporosis:  - Continue calcium carbonate 1250 mg BID with meals  Diet: HH  DVT Ppx: SCDs and [place] lovenox  Code Status: DNR/DNI; we had a discussion with the patient about whether she would like CPR or intubation in the event that these were necessary during this or future hospitalizations; she confirmed that she would not. Her HCPOA is her sister, [name] [phone].  Dispo: Disposition is deferred at this time, awaiting improvement of current medical problems. Anticipated discharge in approximately 2 day(s).   The patient does have a current PCP (Rushil Sherrye Payor, MD) and does need an [facility] hospital follow-up appointment after discharge.  The patient does not have transportation limitations that hinder transportation to clinic appointments.  Signed: Karlene Einstein, MD 06/27/2014, 2:52 AM

## 2014-06-26 NOTE — ED Provider Notes (Signed)
CSN: [id number]     Arrival date & time 06/26/14  1817 History   First MD Initiated Contact with Patient 06/26/14 1825     Chief Complaint  Patient presents with  . Shortness of Breath    hx of COPD     (Consider location/radiation/quality/duration/timing/severity/associated sxs/prior Treatment) HPI  Patient presents with concern of dyspnea. Progressive onset over several days, no relief in spite of using increased frequency of albuterol treatments. No concurrent fever, vomiting, diarrhea. There is generalized weakness. Patient received albuterol en route, with some improvement in her condition. Patient acknowledges history of COPD, still smokes, though states she is smoking less.  I encouraged her to completely stop smoking.     Past Medical History  Diagnosis Date  . Depression   . Suicidal ideation 2007     attempted overdose 2012  Dr. Tivis Ringer report  . Bipolar disorder   . Substance abuse      narcotics, alcohol, tobacco  . Chronic pain syndrome     follows at [facility]  . DEFICIENCY, VITAMIN D NOS 01/03/2007  . Chronic lower back pain   . Anxiety   . OSTEOPOROSIS 06/17/2009    DEXA 05/2009 : L femur -2.9; R femur -2.5. Alendronate on med list but not taking. Needs addressed ASAP as h/o fractures.    . Elevated liver function tests   . On home oxygen therapy     "3L; 24/7" (03/20/2014)  . Shortness of breath     "all the time" (02/27/2013)  . COPD (chronic obstructive pulmonary disease)     oxygen dependent  . Sciatic pain   . Anemia   . Pneumonia X 1?  . Hyperthyroidism     "borderline"  . History of blood transfusion     "getting my 1st later tonight" (03/20/2014)  . Arthritis     "legs, back; hips, primarily left hip" (03/20/2014)   Past Surgical History  Procedure Laterality Date  . Orif ankle fracture Left 10/2010  . Cesarean section  1974; 1979  . Tubal ligation  1979  . Augmentation mammaplasty  ~ 2007  . Fracture surgery    . Ankle  debridement Left 10/2010   Family History  Problem Relation Age of Onset  . Stroke Neg Hx   . Cancer Neg Hx   . Myelodysplastic syndrome Father     Died from myelofibrosis though diagnosed post-mortem   History  Substance Use Topics  . Smoking status: Current Every Day Smoker -- 0.20 packs/day for 45 years    Types: Cigarettes  . Smokeless tobacco: Never Used     Comment: 03/20/2014 "smoking 2 cigarettes/day; that's down from 2 ppd"  . Alcohol Use: 0.0 oz/week    0 Standard drinks or equivalent per week     Comment: 03/20/2014 "nothing in 7 years; never had a problem w/it"   OB History    No data available     Review of Systems  Constitutional:       Per HPI, otherwise negative  HENT:       Per HPI, otherwise negative  Respiratory:       Per HPI, otherwise negative  Cardiovascular:       Per HPI, otherwise negative  Gastrointestinal: Negative for vomiting.  Endocrine:       Negative aside from HPI  Genitourinary:       Neg aside from HPI   Musculoskeletal:       Per HPI, otherwise negative  Skin: Negative.  Neurological: Negative for syncope.      Allergies  Banana and Pollen extract  Home Medications   Prior to Admission medications   Medication Sig Start Date End Date Taking? Authorizing Provider  albuterol (PROVENTIL) (2.5 MG/3ML) 0.083% nebulizer solution Take 3 mLs (2.5 mg total) by nebulization every 4 (four) hours. Shortness of breath 04/17/14   Riccardo Dubin, MD  calcium carbonate (OS-CAL) 600 MG TABS tablet Take 600 mg by mouth 2 (two) times daily with a meal.    Historical Provider, MD  cholecalciferol (VITAMIN D) 1000 UNITS tablet Take 1,000 Units by mouth daily. 06/09/11   Ansel Bong, MD  diphenhydrAMINE (BENADRYL) 25 MG tablet Take 25 mg by mouth as needed.     Historical Provider, MD  DULoxetine (CYMBALTA) 30 MG capsule Take 30 mg by mouth 2 (two) times daily.     Historical Provider, MD  ferrous sulfate 325 (65 FE) MG tablet Take 1 tablet (325 mg  total) by mouth 3 (three) times daily with meals. Patient not taking: Reported on 06/13/2014 03/22/14   Ejiroghene E Denton Brick, MD  fluticasone (FLONASE) 50 MCG/ACT nasal spray Place 1 spray into both nostrils daily. 06/13/14 06/13/15  Francesca Oman, DO  Fluticasone-Salmeterol (ADVAIR DISKUS) 250-50 MCG/DOSE AEPB INHALE 1 PUFF INTO THE LUNGS 2 TIMES DAILY 03/19/14   Jones Bales, MD  ipratropium (ATROVENT) 0.02 % nebulizer solution Take 2.5 mLs (0.5 mg total) by nebulization 2 (two) times daily. 04/16/14   Rushil Sherrye Payor, MD  loratadine (CLARITIN) 10 MG tablet Take 10 mg by mouth daily.    Historical Provider, MD  oxyCODONE (ROXICODONE) 15 MG immediate release tablet Take 15 mg by mouth every 6 (six) hours as needed for pain.    Historical Provider, MD  PROAIR HFA 108 (90 BASE) MCG/ACT inhaler INHALE ONE PUFF BY MOUTH EVERY 2 TO 4 HOURS AS NEEDED FOR WHEEZING 05/21/14   Rushil Sherrye Payor, MD   BP 159/70 mmHg  Pulse 80  Temp(Src) 98.7 F (37.1 C) (Oral)  Resp 18  SpO2 88% Physical Exam  Constitutional: She is oriented to person, place, and time. She appears well-developed and well-nourished. No distress.  HENT:  Head: Normocephalic and atraumatic.  Eyes: Conjunctivae and EOM are normal.  Cardiovascular: Normal rate and regular rhythm.   Pulmonary/Chest: No stridor. She has decreased breath sounds. She has wheezes. She has rhonchi.  Abdominal: She exhibits no distension.  Musculoskeletal: She exhibits no edema.  Neurological: She is alert and oriented to person, place, and time. No cranial nerve deficit.  Skin: Skin is warm and dry.  Psychiatric: She has a normal mood and affect.  Nursing note and vitals reviewed.   ED Course  Procedures (including critical care time) Labs Review Labs Reviewed  BASIC METABOLIC PANEL - Abnormal; Notable for the following:    Glucose, Bld 112 (*)    Creatinine, Ser 0.34 (*)    Calcium 8.0 (*)    All other components within normal limits  BRAIN NATRIURETIC PEPTIDE  - Abnormal; Notable for the following:    B Natriuretic Peptide 139.5 (*)    All other components within normal limits  CBC WITH DIFFERENTIAL/PLATELET - Abnormal; Notable for the following:    RBC 3.27 (*)    Hemoglobin 9.8 (*)    HCT 32.6 (*)    RDW 16.9 (*)    Platelets 541 (*)    Neutrophils Relative % 97 (*)    Lymphocytes Relative 3 (*)    Lymphs  Abs 0.2 (*)    Monocytes Relative 0 (*)    Monocytes Absolute 0.0 (*)    All other components within normal limits  Randolm Idol, ED    Imaging Review Dg Chest 2 View  06/26/2014   CLINICAL DATA:  Increasing shortness of breath for 4 days. History of COPD.  EXAM: CHEST  2 VIEW  COMPARISON:  05/01/2014  FINDINGS: Hyperinflation with severe apical predominant emphysema. Cardiomediastinal contours are normal. No pulmonary edema, consolidation, pleural effusion or pneumothorax. Unchanged scoliotic curvature of spine.  IMPRESSION: Emphysema without acute process.   Electronically Signed   By: Jeb Levering M.D.   On: 06/26/2014 20:19     EKG Interpretation   Date/Time:  Thursday June 26 2014 18:28:47 EDT Ventricular Rate:  82 PR Interval:  125 QRS Duration: 66 QT Interval:  361 QTC Calculation: 422 R Axis:   86 Text Interpretation:  Sinus rhythm Borderline right axis deviation  Probable anteroseptal infarct, old Baseline wander in lead(s) II Sinus  rhythm Artifact T wave abnormality Abnormal ekg Confirmed by Carmin Muskrat  MD ([id number]) on 06/26/2014 7:51:52 PM        Pulse ox 88% on 2 L nasal cannula, abnormal Pulse 90 sinus rhythm normal  Pulse ox improved to 97% with 4 L nasal cannula.  Now following four-hour nebulizer therapy, the patient remains hypoxic, requiring increased nasal cannula support to achieve appropriate saturation. Breath sounds remain particularly course, she remains tachypneic.   MDM   Final diagnoses:  COPD exacerbation   patient with home oxygen dependency, COPD now presents with  dyspnea Patient does have mild chest pain, but no evidence for ongoing coronary ischemia. Patient has hypoxia, coarse breath sounds initially, and both of these improved, though they do not resolve after initiation of steroids, multiple breathing treatments. After continuous nebulizer session, patient remained hypoxic, tachypneic, and was admitted for further evaluation and management for presumed COPD exacerbation.  Carmin Muskrat, MD 06/27/14 [phone]

## 2014-06-26 NOTE — Progress Notes (Addendum)
Pt seen and assessed per adult wheeze protocol.  Pt sitting up in bed, stating she is sob at rest.  BBS noted with expiratory wheeze throughout all lung fields.  Pt on 4lnc, spo2 90%.  Pt stated she has already taken several breathing treatments at home and received another by EMS with little relief.  Pt stated she does not have enough breath to attempt peak flow per RT wheeze protocol.  RT recommended CAT and ok per Dr Vanita Panda.  RT will continue to monitor pt.

## 2014-06-26 NOTE — ED Notes (Signed)
Bed: ME15 Expected date:  Expected time:  Means of arrival:  Comments: EMS-COPD

## 2014-06-26 NOTE — ED Notes (Signed)
Pt presents with c/o shortness of breath, progressive onset, hx of COPD, denies chest pain, n/v, received 125 mg Solumedrol IV, 10 mg Albuterol and 0.5 mg Atrovent en route from EMS. Report of using her nebulizer at home without getting any relief. Pt in NAD, receiving a treatment on arrival.

## 2014-06-27 ENCOUNTER — Encounter (HOSPITAL_COMMUNITY): Payer: Self-pay | Admitting: Internal Medicine

## 2014-06-27 DIAGNOSIS — M1990 Unspecified osteoarthritis, unspecified site: Secondary | ICD-10-CM

## 2014-06-27 DIAGNOSIS — Z79891 Long term (current) use of opiate analgesic: Secondary | ICD-10-CM

## 2014-06-27 DIAGNOSIS — D509 Iron deficiency anemia, unspecified: Secondary | ICD-10-CM

## 2014-06-27 DIAGNOSIS — J441 Chronic obstructive pulmonary disease with (acute) exacerbation: Secondary | ICD-10-CM | POA: Diagnosis not present

## 2014-06-27 DIAGNOSIS — J302 Other seasonal allergic rhinitis: Secondary | ICD-10-CM | POA: Diagnosis not present

## 2014-06-27 DIAGNOSIS — F17210 Nicotine dependence, cigarettes, uncomplicated: Secondary | ICD-10-CM | POA: Diagnosis not present

## 2014-06-27 DIAGNOSIS — R110 Nausea: Secondary | ICD-10-CM

## 2014-06-27 DIAGNOSIS — K5900 Constipation, unspecified: Secondary | ICD-10-CM

## 2014-06-27 DIAGNOSIS — Z79899 Other long term (current) drug therapy: Secondary | ICD-10-CM

## 2014-06-27 DIAGNOSIS — F319 Bipolar disorder, unspecified: Secondary | ICD-10-CM

## 2014-06-27 DIAGNOSIS — Z9981 Dependence on supplemental oxygen: Secondary | ICD-10-CM

## 2014-06-27 DIAGNOSIS — M810 Age-related osteoporosis without current pathological fracture: Secondary | ICD-10-CM

## 2014-06-27 DIAGNOSIS — G894 Chronic pain syndrome: Secondary | ICD-10-CM

## 2014-06-27 LAB — URINALYSIS, ROUTINE W REFLEX MICROSCOPIC
Bilirubin Urine: NEGATIVE
GLUCOSE, UA: NEGATIVE mg/dL
HGB URINE DIPSTICK: NEGATIVE
Ketones, ur: NEGATIVE mg/dL
Leukocytes, UA: NEGATIVE
Nitrite: NEGATIVE
Protein, ur: NEGATIVE mg/dL
SPECIFIC GRAVITY, URINE: 1.016 (ref 1.005–1.030)
Urobilinogen, UA: 0.2 mg/dL (ref 0.0–1.0)
pH: 7.5 (ref 5.0–8.0)

## 2014-06-27 LAB — CBC WITH DIFFERENTIAL/PLATELET
BASOS ABS: 0 10*3/uL (ref 0.0–0.1)
Basophils Relative: 0 % (ref 0–1)
Eosinophils Absolute: 0 10*3/uL (ref 0.0–0.7)
Eosinophils Relative: 0 % (ref 0–5)
HCT: 31.8 % — ABNORMAL LOW (ref 36.0–46.0)
Hemoglobin: 9.8 g/dL — ABNORMAL LOW (ref 12.0–15.0)
LYMPHS PCT: 6 % — AB (ref 12–46)
Lymphs Abs: 0.4 10*3/uL — ABNORMAL LOW (ref 0.7–4.0)
MCH: 30.2 pg (ref 26.0–34.0)
MCHC: 30.8 g/dL (ref 30.0–36.0)
MCV: 97.8 fL (ref 78.0–100.0)
MONOS PCT: 2 % — AB (ref 3–12)
Monocytes Absolute: 0.1 10*3/uL (ref 0.1–1.0)
NEUTROS PCT: 92 % — AB (ref 43–77)
Neutro Abs: 5.3 10*3/uL (ref 1.7–7.7)
PLATELETS: 525 10*3/uL — AB (ref 150–400)
RBC: 3.25 MIL/uL — ABNORMAL LOW (ref 3.87–5.11)
RDW: 16.8 % — ABNORMAL HIGH (ref 11.5–15.5)
WBC: 5.8 10*3/uL (ref 4.0–10.5)

## 2014-06-27 LAB — TROPONIN I: Troponin I: <0.03 ng/mL (ref <0.031)

## 2014-06-27 LAB — MAGNESIUM: MAGNESIUM: 1.9 mg/dL (ref 1.5–2.5)

## 2014-06-27 LAB — COMPREHENSIVE METABOLIC PANEL
ALK PHOS: 95 U/L (ref 39–117)
ALT: 13 U/L (ref 0–35)
AST: 18 U/L (ref 0–37)
Albumin: 3.1 g/dL — ABNORMAL LOW (ref 3.5–5.2)
Anion gap: 11 (ref 5–15)
BUN: 9 mg/dL (ref 6–23)
CO2: 32 mmol/L (ref 19–32)
Calcium: 8.1 mg/dL — ABNORMAL LOW (ref 8.4–10.5)
Chloride: 97 mmol/L (ref 96–112)
Creatinine, Ser: 0.39 mg/dL — ABNORMAL LOW (ref 0.50–1.10)
GFR calc non Af Amer: >90 mL/min (ref >90)
Glucose, Bld: 140 mg/dL — ABNORMAL HIGH (ref 70–99)
Potassium: 3.8 mmol/L (ref 3.5–5.1)
Sodium: 140 mmol/L (ref 135–145)
Total Bilirubin: 0.1 mg/dL — ABNORMAL LOW (ref 0.3–1.2)
Total Protein: 6.1 g/dL (ref 6.0–8.3)

## 2014-06-27 MED ORDER — LEVOFLOXACIN 500 MG PO TABS
500.0000 mg | ORAL_TABLET | Freq: Once | ORAL | Status: AC
  Administered 2014-06-27: 500 mg via ORAL
  Filled 2014-06-27: qty 1

## 2014-06-27 MED ORDER — BISACODYL 10 MG RE SUPP: 10.0000 mg | Freq: Every day | RECTAL | Status: DC | PRN

## 2014-06-27 MED ORDER — LEVOFLOXACIN 500 MG PO TABS
500.0000 mg | ORAL_TABLET | ORAL | Status: DC
  Filled 2014-06-27: qty 1

## 2014-06-27 MED ORDER — CALCIUM CARBONATE 1250 (500 CA) MG PO TABS
1250.0000 mg | ORAL_TABLET | Freq: Two times a day (BID) | ORAL | Status: DC
  Administered 2014-06-27 – 2014-06-28 (×3): 1250 mg via ORAL
  Filled 2014-06-27 (×5): qty 1

## 2014-06-27 MED ORDER — OXYCODONE-ACETAMINOPHEN 5-325 MG PO TABS
1.0000 | ORAL_TABLET | Freq: Once | ORAL | Status: AC
  Administered 2014-06-28: 1 via ORAL
  Filled 2014-06-27: qty 1

## 2014-06-27 MED ORDER — LEVOFLOXACIN 500 MG PO TABS
500.0000 mg | ORAL_TABLET | ORAL | Status: DC
Start: 2014-06-28 — End: 2014-06-27
  Filled 2014-06-27: qty 1

## 2014-06-27 MED ORDER — DM-GUAIFENESIN ER 30-600 MG PO TB12
1.0000 | ORAL_TABLET | Freq: Two times a day (BID) | ORAL | Status: DC
Start: 2014-06-27 — End: 2014-06-28
  Administered 2014-06-27 – 2014-06-28 (×4): 1 via ORAL
  Filled 2014-06-27 (×5): qty 1

## 2014-06-27 MED ORDER — FLUTICASONE PROPIONATE 50 MCG/ACT NA SUSP
1.0000 | Freq: Every day | NASAL | Status: DC
  Administered 2014-06-27 – 2014-06-28 (×2): 1 via NASAL
  Filled 2014-06-27: qty 16

## 2014-06-27 MED ORDER — DIPHENHYDRAMINE HCL 25 MG PO CAPS: 25.0000 mg | ORAL_CAPSULE | Freq: Four times a day (QID) | ORAL | Status: DC | PRN

## 2014-06-27 MED ORDER — BUDESONIDE 0.25 MG/2ML IN SUSP
0.2500 mg | Freq: Two times a day (BID) | RESPIRATORY_TRACT | Status: DC
  Administered 2014-06-27 – 2014-06-28 (×3): 0.25 mg via RESPIRATORY_TRACT
  Filled 2014-06-27 (×5): qty 2

## 2014-06-27 MED ORDER — IPRATROPIUM-ALBUTEROL 0.5-2.5 (3) MG/3ML IN SOLN
3.0000 mL | RESPIRATORY_TRACT | Status: DC
  Administered 2014-06-27 – 2014-06-28 (×9): 3 mL via RESPIRATORY_TRACT
  Filled 2014-06-27 (×9): qty 3

## 2014-06-27 MED ORDER — ALBUTEROL SULFATE (2.5 MG/3ML) 0.083% IN NEBU: 2.5000 mg | INHALATION_SOLUTION | RESPIRATORY_TRACT | Status: DC | PRN

## 2014-06-27 MED ORDER — PANTOPRAZOLE SODIUM 40 MG PO TBEC
40.0000 mg | DELAYED_RELEASE_TABLET | Freq: Every day | ORAL | Status: DC
  Administered 2014-06-27 – 2014-06-28 (×2): 40 mg via ORAL
  Filled 2014-06-27: qty 1

## 2014-06-27 MED ORDER — LORATADINE 10 MG PO TABS
10.0000 mg | ORAL_TABLET | Freq: Every day | ORAL | Status: DC
Start: 2014-06-27 — End: 2014-06-28
  Administered 2014-06-27 – 2014-06-28 (×2): 10 mg via ORAL
  Filled 2014-06-27 (×2): qty 1

## 2014-06-27 MED ORDER — DULOXETINE HCL 30 MG PO CPEP
30.0000 mg | ORAL_CAPSULE | Freq: Two times a day (BID) | ORAL | Status: DC
  Filled 2014-06-27: qty 1

## 2014-06-27 MED ORDER — MORPHINE SULFATE 2 MG/ML IJ SOLN
1.0000 mg | Freq: Once | INTRAMUSCULAR | Status: AC
  Administered 2014-06-27: 1 mg via INTRAVENOUS
  Filled 2014-06-27: qty 1

## 2014-06-27 MED ORDER — VITAMIN D3 25 MCG (1000 UNIT) PO TABS
1000.0000 [IU] | ORAL_TABLET | Freq: Every day | ORAL | Status: DC
  Administered 2014-06-27 – 2014-06-28 (×2): 1000 [IU] via ORAL
  Filled 2014-06-27 (×2): qty 1

## 2014-06-27 MED ORDER — SODIUM CHLORIDE 0.9 % IJ SOLN
3.0000 mL | Freq: Two times a day (BID) | INTRAMUSCULAR | Status: DC
  Administered 2014-06-27 – 2014-06-28 (×3): 3 mL via INTRAVENOUS

## 2014-06-27 MED ORDER — ENOXAPARIN SODIUM 30 MG/0.3ML ~~LOC~~ SOLN
30.0000 mg | Freq: Every day | SUBCUTANEOUS | Status: DC
  Administered 2014-06-27 – 2014-06-28 (×2): 30 mg via SUBCUTANEOUS
  Filled 2014-06-27 (×2): qty 0.3

## 2014-06-27 MED ORDER — ZOLPIDEM TARTRATE 5 MG PO TABS
5.0000 mg | ORAL_TABLET | Freq: Every evening | ORAL | Status: DC | PRN
  Administered 2014-06-27 (×2): 5 mg via ORAL
  Filled 2014-06-27 (×2): qty 1

## 2014-06-27 MED ORDER — MOMETASONE FURO-FORMOTEROL FUM 100-5 MCG/ACT IN AERO
2.0000 | INHALATION_SPRAY | Freq: Two times a day (BID) | RESPIRATORY_TRACT | Status: DC
  Filled 2014-06-27: qty 8.8

## 2014-06-27 MED ORDER — ONDANSETRON HCL 4 MG/2ML IJ SOLN: 4.0000 mg | Freq: Four times a day (QID) | INTRAMUSCULAR | Status: DC | PRN

## 2014-06-27 MED ORDER — PREDNISONE 20 MG PO TABS
40.0000 mg | ORAL_TABLET | Freq: Every day | ORAL | Status: DC
  Administered 2014-06-28: 40 mg via ORAL
  Filled 2014-06-27 (×2): qty 2

## 2014-06-27 MED ORDER — METHYLPREDNISOLONE SODIUM SUCC 125 MG IJ SOLR
60.0000 mg | INTRAMUSCULAR | Status: AC
  Administered 2014-06-27: 60 mg via INTRAVENOUS
  Filled 2014-06-27: qty 2

## 2014-06-27 MED ORDER — DULOXETINE HCL 30 MG PO CPEP
30.0000 mg | ORAL_CAPSULE | Freq: Two times a day (BID) | ORAL | Status: DC
  Administered 2014-06-27 – 2014-06-28 (×4): 30 mg via ORAL
  Filled 2014-06-27 (×5): qty 1

## 2014-06-27 NOTE — Progress Notes (Signed)
Subjective: This morning Ms Smead feels her SOB, wheezing have improved but are still present. She still has a cough productive of dark sputum. She says her L hand does not hurt. She has no other complaints besides being tired from the overnight transfer from [facility].  Objective: Vital signs in last 24 hours: Filed Vitals:   06/26/14 2344 06/27/14 0216 06/27/14 0222 06/27/14 0624  BP: 154/74 151/77  158/71  Pulse: 74 77  84  Temp:  98.7 F (37.1 C)  99.7 F (37.6 C)  TempSrc:  Oral  Oral  Resp: 20 20  16   Height:   5\' 1"  (1.549 m)   Weight:   91 lb (41.277 kg)   SpO2: 94% 92%  95%   Weight change:   Intake/Output Summary (Last 24 hours) at 06/27/14 0934 Last data filed at 06/27/14 0853  Gross per 24 hour  Intake     60 ml  Output   1000 ml  Net   -940 ml   Gen: A&O x 4, no acute distress, very thin and frail, sitting up on side of bed with breakfast tray HEENT: Atraumatic, PERRL, EOMI, sclerae anicteric, moist mucous membranes Heart: Regular rate and rhythm, normal S1 S2, no murmurs, rubs, or gallops Lungs: Faint expiratory wheezes bilaterally at apex, respirations unlabored Abd: Soft, non-tender, non-distended, + bowel sounds, no hepatosplenomegaly Ext: No edema or cyanosis, some erythema on L dorsal hand without tenderness or deformity   Lab Results: Basic Metabolic Panel:  Recent Labs Lab 06/26/14 1941 06/27/14 0248  NA 141 140  K 4.3 3.8  CL 98 97  CO2 32 32  GLUCOSE 112* 140*  BUN 12 9  CREATININE 0.34* 0.39*  CALCIUM 8.0* 8.1*  MG  --  1.9   Liver Function Tests:  Recent Labs Lab 06/27/14 0248  AST 18  ALT 13  ALKPHOS 95  BILITOT 0.1*  PROT 6.1  ALBUMIN 3.1*   CBC:  Recent Labs Lab 06/26/14 2329 06/27/14 0248  WBC 7.8 5.8  NEUTROABS 7.5 5.3  HGB 9.8* 9.8*  HCT 32.6* 31.8*  MCV 99.7 97.8  PLT 541* 525*   Cardiac Enzymes:  Recent Labs Lab 06/27/14 0520  TROPONINI <0.03   Urinalysis:  Recent Labs Lab 06/27/14 0550    COLORURINE YELLOW  LABSPEC 1.016  PHURINE 7.5  GLUCOSEU NEGATIVE  HGBUR NEGATIVE  BILIRUBINUR NEGATIVE  KETONESUR NEGATIVE  PROTEINUR NEGATIVE  UROBILINOGEN 0.2  NITRITE NEGATIVE  LEUKOCYTESUR NEGATIVE   Micro Results: No results found for this or any previous visit (from the past 240 hour(s)). Studies/Results: Dg Chest 2 View  06/26/2014   CLINICAL DATA:  Increasing shortness of breath for 4 days. History of COPD.  EXAM: CHEST  2 VIEW  COMPARISON:  05/01/2014  FINDINGS: Hyperinflation with severe apical predominant emphysema. Cardiomediastinal contours are normal. No pulmonary edema, consolidation, pleural effusion or pneumothorax. Unchanged scoliotic curvature of spine.  IMPRESSION: Emphysema without acute process.   Electronically Signed   By: Jeb Levering M.D.   On: 06/26/2014 20:19   Medications: I have reviewed the patient's current medications. Scheduled Meds: . budesonide (PULMICORT) nebulizer solution  0.25 mg Nebulization BID  . calcium carbonate  1,250 mg Oral BID WC  . cholecalciferol  1,000 Units Oral Daily  . dextromethorphan-guaiFENesin  1 tablet Oral BID  . DULoxetine  30 mg Oral BID  . enoxaparin (LOVENOX) injection  30 mg Subcutaneous Daily  . fluticasone  1 spray Each Nare Daily  . ipratropium-albuterol  3 mL Nebulization Q4H  . [START ON 06/28/2014] levofloxacin  500 mg Oral Q24H  . loratadine  10 mg Oral Daily  . oxyCODONE-acetaminophen  1 tablet Oral Once  . pantoprazole  40 mg Oral Daily  . [START ON 06/28/2014] predniSONE  40 mg Oral Q breakfast  . sodium chloride  3 mL Intravenous Q12H   Continuous Infusions:  PRN Meds:.albuterol, bisacodyl, diphenhydrAMINE, ondansetron (ZOFRAN) IV, oxyCODONE, zolpidem Assessment/Plan: Principal Problem:   COPD exacerbation Active Problems:   Bronchitis, allergic   Seasonal allergies   Dyslipidemia   Chronic pain   GERD (gastroesophageal reflux disease)  COPD Exacerbation: Ms Pankowski says her SOB, wheezing,  productive cough have improved after receiving solumedrol 125 mg iv once and beginning antibiotics nad scheduled duonebs but are still present. On exam her lungs are much improved as there are only faint wheezing appreciated and she is sating well on 4L. This was likely brought on by URI. Her chest pain appears to be musculoskeletal. - Duonebs q4 hours - Albuterol inhaler q2 hours PRN - Pulmicort 0.25 mg neb bid - Solumedrol 60 mg iv this morning, begin prednisone 40 mg po daily tomorrow - Levaquin per pharmacy - Mucinex for cough - Cardiac monitoring - Supplemental O2 with goal 92% saturation  Seasonal Allergies: Active currently. - Benadryl 25 mg PRN - Flonase 1 spray each nare - Claritin 10 mg daily  Nausea and Constipation: Ms Gossen endorses some nonspecific abdominal discomfort this morning. May be due to chronic opioid use or advancing pulmonary problems. Has been seen by Dr. Sharlett Iles (GI), who suggested starting PPI and feel she is not a candidate for scoping; PPI initiated on admission - Zofran 4 mg IV q6 hours PRN - Protonix 40 mg po daily - Dulcolax suppository PRN - Can consider outpatient gastric emptying study (suggested by GI in prior notes)  Iron Deficiency Anemia: Hemoglobin 9.8 on presentation, which is near baseline. Hospitalization in 03/2014 for hemoglobin of 5.2.  Osteoarthritis and Chronic Pain: - Oxy IR 15 mg q6 hours PRN  Bipolar Disorder: There was question whether she was on zyprexa, which is not on medication list, as last night she thought she was. This morning she thinks it was only cymbalta 30 mg bid which is on her list. Has a history of auditory hallucinations which have instructed her to harm herself in past; no active depression or symptoms. - Continue cymbalta 30 mg BID (home medication)  Osteoporosis:  - Continue calcium carbonate 1250 mg BID with meals  Diet: HH  DVT Ppx: SCDs and [place] lovenox  Code Status: DNR/DNI  Dispo: Disposition is  deferred at this time, awaiting improvement of current medical problems.  Anticipated discharge in approximately 1-2 day(s).   The patient does have a current PCP (Rushil Sherrye Payor, MD) and does need an [facility] hospital follow-up appointment after discharge.  The patient does not know have transportation limitations that hinder transportation to clinic appointments.  .Services Needed at time of discharge: Y = Yes, Blank = No PT:   OT:   RN:   Equipment:   Other:     LOS: 1 day   Kelby Aline, MD 06/27/2014, 9:34 AM

## 2014-06-27 NOTE — Care Management Note (Signed)
    Page 1 of 1   06/27/2014     3:10:41 PM CARE MANAGEMENT NOTE 06/27/2014  Patient:  [name], [name]   Account Number:  [id number]  Date Initiated:  06/27/2014  Documentation initiated by:  Carles Collet  Subjective/Objective Assessment:   copd exacerbation, po antibiotics and steroids, increased O2 requirement     Action/Plan:   from home alone,  will follow for any needs   Anticipated DC Date:  06/28/2014   Anticipated DC Plan:  [place]  CM consult      Choice offered to / List presented to:             Status of service:  In process, will continue to follow Medicare Important Message given?   (If response is "NO", the following Medicare IM given date fields will be blank) Date Medicare IM given:   Medicare IM given by:   Date Additional Medicare IM given:   Additional Medicare IM given by:    Discharge Disposition:    Per UR Regulation:    If discussed at Long Length of Stay Meetings, dates discussed:    Comments:  06-27-14 no CM needs at this time, will continue to follow. Pt changed to OBS status per recommendation of Medical Advisor and Attending MD. Carles Collet RN BSN CM

## 2014-06-28 DIAGNOSIS — I501 Left ventricular failure: Secondary | ICD-10-CM | POA: Diagnosis not present

## 2014-06-28 DIAGNOSIS — J441 Chronic obstructive pulmonary disease with (acute) exacerbation: Secondary | ICD-10-CM | POA: Diagnosis not present

## 2014-06-28 LAB — URINE CULTURE: Colony Count: 25000

## 2014-06-28 MED ORDER — PREDNISONE 20 MG PO TABS: 40.0000 mg | ORAL_TABLET | Freq: Every day | ORAL | Status: DC

## 2014-06-28 MED ORDER — LEVOFLOXACIN 500 MG PO TABS: 500.0000 mg | ORAL_TABLET | ORAL | Status: DC

## 2014-06-28 MED ORDER — PANTOPRAZOLE SODIUM 40 MG PO TBEC: 40.0000 mg | DELAYED_RELEASE_TABLET | Freq: Every day | ORAL | Status: DC

## 2014-06-28 NOTE — Discharge Instructions (Signed)
It was a pleasure to care for you at [facility]. You are to take the antibiotic levofloxacin one tab Sunday 4/17 and the second tab Tuesday 4/19. You are to take the steroid prednisone 40 mg (two 20 mg tabs) once a day on Sunday 4/17, Monday 4/18, Tuesday 4/19, and Wednesday 4/20. Please return to the Emergency Department or seek medical attention if you have any new or worsening trouble breathing, fever, or other worrisome medical condition.   Lottie Mussel, MD

## 2014-06-28 NOTE — Progress Notes (Signed)
06/28/14 patient to be discharged home today, IV site removed and discharge instructions reviewed with patient.

## 2014-06-28 NOTE — Discharge Summary (Signed)
Name: [name] MRN: [id number] DOB: 1951-01-30 64 y.o. PCP: Riccardo Dubin, MD  Date of Admission: 06/26/2014  6:17 PM Date of Discharge: 06/28/2014 Attending Physician: Aldine Contes, MD  Discharge Diagnosis:  Principal Problem:   COPD exacerbation Active Problems:   Bronchitis, allergic   Seasonal allergies   Dyslipidemia   Chronic pain   GERD (gastroesophageal reflux disease)  Discharge Medications:   Medication List    TAKE these medications        albuterol (2.5 MG/3ML) 0.083% nebulizer solution  Commonly known as:  PROVENTIL  Take 3 mLs (2.5 mg total) by nebulization every 4 (four) hours. Shortness of breath     PROAIR HFA 108 (90 BASE) MCG/ACT inhaler  Generic drug:  albuterol  INHALE ONE PUFF BY MOUTH EVERY 2 TO 4 HOURS AS NEEDED FOR WHEEZING     cholecalciferol 1000 UNITS tablet  Commonly known as:  VITAMIN D  Take 1,000 Units by mouth daily.     diphenhydrAMINE 25 MG tablet  Commonly known as:  BENADRYL  Take 25 mg by mouth as needed.     DULoxetine 30 MG capsule  Commonly known as:  CYMBALTA  Take 30 mg by mouth 2 (two) times daily.     fluticasone 50 MCG/ACT nasal spray  Commonly known as:  FLONASE  Place 1 spray into both nostrils daily.     Fluticasone-Salmeterol 250-50 MCG/DOSE Aepb  Commonly known as:  ADVAIR DISKUS  INHALE 1 PUFF INTO THE LUNGS 2 TIMES DAILY     ipratropium 0.02 % nebulizer solution  Commonly known as:  ATROVENT  Take 2.5 mLs (0.5 mg total) by nebulization 2 (two) times daily.     levofloxacin 500 MG tablet  Commonly known as:  LEVAQUIN  Take 1 tablet (500 mg total) by mouth every other day. Take one tab on 4/17 and one tab 4/19  Start taking on:  06/29/2014     loratadine 10 MG tablet  Commonly known as:  CLARITIN  Take 10 mg by mouth daily.     oxyCODONE 15 MG immediate release tablet  Commonly known as:  ROXICODONE  Take 15 mg by mouth every 6 (six) hours as needed for pain.     pantoprazole 40 MG  tablet  Commonly known as:  PROTONIX  Take 1 tablet (40 mg total) by mouth daily.     predniSONE 20 MG tablet  Commonly known as:  DELTASONE  Take 2 tablets (40 mg total) by mouth daily with breakfast.        Disposition and follow-up:   JordanKei H Mcclure was discharged from [facility] in [place] condition.  At the hospital follow up visit please address:  1.  Resolution of symptoms, abdominal discomfort, PPI  2.  Labs / imaging needed at time of follow-up: consider gastric emptying study  3.  Pending labs/ test needing follow-up: none  Follow-up Appointments: Follow-up Information    Follow up with Albin Felling, MD On 07/10/2014.   Specialty:  Internal Medicine   Why:  @ 2:45 pm   Contact information:   [place] [address] [phone]       Discharge Instructions:   Consultations:    Procedures Performed:  Dg Chest 2 View  06/26/2014   CLINICAL DATA:  Increasing shortness of breath for 4 days. History of COPD.  EXAM: CHEST  2 VIEW  COMPARISON:  05/01/2014  FINDINGS: Hyperinflation with severe apical predominant emphysema. Cardiomediastinal contours  are normal. No pulmonary edema, consolidation, pleural effusion or pneumothorax. Unchanged scoliotic curvature of spine.  IMPRESSION: Emphysema without acute process.   Electronically Signed   By: Jeb Levering M.D.   On: 06/26/2014 20:19   Dg Abd 2 Views  06/13/2014   CLINICAL DATA:  64 year old female with periumbilical pain, intermittent vomiting for 6 months. Diarrhea. Initial encounter.  EXAM: ABDOMEN - 2 VIEW  COMPARISON:  Chest CT 10/16/2013. Abdomen MRI 12/07/2012. CT Abdomen and Pelvis 11/27/2012.  FINDINGS: Supine upright views of the abdomen. No pneumoperitoneum. Non obstructed bowel gas pattern. Similar volume of retained stool in the colon to that seen in 2014. Gastric ptosis. Scoliosis. Negative lung bases. No acute osseous abnormality identified.  IMPRESSION: Normal bowel gas  pattern, no free air.   Electronically Signed   By: Genevie Ann M.D.   On: 06/13/2014 15:32   Admission HPI: [name] is a 64 yo woman with a history of end-stage COPD, iron-deficiency anemia, bipolar disorder and osteoporosis who presented to the [facility] ED with shortness of breath. She first noticed the shortness of breath one week ago along with the development of a cough, congestion and weakness. The shortness of breath was worst when she was active, for example, when she was cooking or moving around her apartment. She normally uses 3 L O2 [place] at home and takes nebulizer treatments four times per day; this week, however, she needed 6 nebulizer treatments each day. Her cough was productive of brown sputum. She denies subjective fever or chills.  She has had ~2 hospitalizations for COPD per year over the past few years; they tend to be more common in the winter months. Last admission was for symptomatic anemia in 03/2014. She has been seen by Dr. Gwenette Greet (pulmonology), but PFTs have not yet been completed. She is currently smoking half a pack of cigarettes per day, which is decreased from 2 packs per day.  Hospital Course by problem list: Principal Problem:   COPD exacerbation Active Problems:   Bronchitis, allergic   Seasonal allergies   Dyslipidemia   Chronic pain   GERD (gastroesophageal reflux disease)   COPD Exacerbation: Patient has had ~2 hospitalizations for COPD exacerbation per year over the past few years; they tend to be more common in the winter months. Episode of desaturation to 85% in ED while on 3L O2  (home dose). On presentation she had loud expiratory wheezes, is requiring more oxygen than at home (4 L). CXR noted emphysema without acute process. However, she had increased dyspnea and increased productive cough so she was treated as acute COPD exacerbation. She was given solumedrol 125 mg iv once on admission and started on levaquin per pharmacy. She was also on scheduled  duonebs and pulmicort. On 4/17 she had some improvement in symptoms and had minimal wheezing on exam so she received solumedrol 60 mg iv once. On the day of discharge, she was started on five day course of prednisone 40 mg daily through 4/20. Per pharmacy, she is on five day course of levofloxacin 500 mg qod and was instructed to take the antibiotic once on 4/17 and once on 4/19. This was likely brought on by URI. Her chest pain appears to be musculoskeletal and resolved with negative troponin and unchanged EKG. She was discharged on her home COPD medications on home O2 requirement of 3L (goal low 90s).  Nausea and Constipation: Ms Fuson had last bowel movement reportedly 8 days before presentation. She also notes nausea and periumbilical  abdominal pain on and off over the past year. May be due to chronic opioid use or advancing pulmonary problems. She has been seen by Dr. Sharlett Iles (GI), who suggested starting PPI; this is not currently on her medication list. GI notes state she is not a candidate for EGD or colonoscopy due to the severity of her COPD. We started her on protonix 40 mg daily and also gave her a dulcolax suppository. This resulted in one bowel movement a day during her stay and improvement in her nonspecific abdominal discomfort. PCP may  consider outpatient gastric emptying study (suggested by GI in prior notes).  Seasonal Allergies: Active currently so she was continued on benadryl 25 mg PRN, flonase 1 spray each nare, and Claritin 10 mg daily  Iron Deficiency Anemia: Hemoglobin is currently 9.8, which is near baseline. She had a recent hospitalization in 03/2014 for hemoglobin of 5.2.  Osteoarthritis and Chronic Pain: - Oxy IR 15 mg q6 hours PRN  Bipolar Disorder: Ms Roediger has a history of auditory hallucinations which have instructed her to harm herself in past; no active depression or symptoms. This was not an issue during hospitalization and we continued her home cymbalta 30 mg  BID.  Osteoporosis: She was continued on calcium carbonate 1250 mg BID with meals  Discharge Vitals:   BP 156/65 mmHg  Pulse 77  Temp(Src) 98.8 F (37.1 C) (Oral)  Resp 16  Ht 5\' 1"  (1.549 m)  Wt 92 lb 4.8 oz (41.867 kg)  BMI 17.45 kg/m2  SpO2 93%  Discharge Physical Exam: Gen: A&O x 4, no acute distress, very thin and frail, resting in bed HEENT: Atraumatic, PERRL, EOMI, sclerae anicteric, moist mucous membranes Heart: Regular rate and rhythm, normal S1 S2, no murmurs, rubs, or gallops Lungs: CTAB, respirations unlabored Abd: Soft, non-tender, non-distended, + bowel sounds, no hepatosplenomegaly Ext: No edema or cyanosis, some erythema on L dorsal hand without tenderness or deformity  Discharge Labs:  Basic Metabolic Panel:  Recent Labs Lab 06/26/14 1941 06/27/14 0248  NA 141 140  K 4.3 3.8  CL 98 97  CO2 32 32  GLUCOSE 112* 140*  BUN 12 9  CREATININE 0.34* 0.39*  CALCIUM 8.0* 8.1*  MG  --  1.9   Liver Function Tests:  Recent Labs Lab 06/27/14 0248  AST 18  ALT 13  ALKPHOS 95  BILITOT 0.1*  PROT 6.1  ALBUMIN 3.1*   CBC:  Recent Labs Lab 06/26/14 2329 06/27/14 0248  WBC 7.8 5.8  NEUTROABS 7.5 5.3  HGB 9.8* 9.8*  HCT 32.6* 31.8*  MCV 99.7 97.8  PLT 541* 525*   Cardiac Enzymes:  Recent Labs Lab 06/27/14 0520  TROPONINI <0.03   Urinalysis:  Recent Labs Lab 06/27/14 0550  COLORURINE YELLOW  LABSPEC 1.016  PHURINE 7.5  GLUCOSEU NEGATIVE  HGBUR NEGATIVE  BILIRUBINUR NEGATIVE  KETONESUR NEGATIVE  PROTEINUR NEGATIVE  UROBILINOGEN 0.2  NITRITE NEGATIVE  LEUKOCYTESUR NEGATIVE      Signed: Kelby Aline, MD 06/28/2014, 8:53 AM    Services Ordered on Discharge: none Equipment Ordered on Discharge: none

## 2014-06-28 NOTE — Progress Notes (Signed)
UR completed 

## 2014-06-28 NOTE — Progress Notes (Signed)
06/28/14 Patient unable to keep 02 Saturation above 88 when attempting ambulation.

## 2014-06-28 NOTE — Social Work (Signed)
CSW facilitated patient discharge home via ambulance due to O2 for safe transportation.   No further CSW needs, patient discharging.   Christene Lye MSW, [place]

## 2014-06-28 NOTE — Progress Notes (Signed)
Subjective: This morning [name] continues to feel much improved. She says she has decrease in her SOB and cough, though still present some. She is agreeable with discharge.    Objective: Vital signs in last 24 hours: Filed Vitals:   06/27/14 2128 06/27/14 2326 06/28/14 0500 06/28/14 0806  BP: 159/62  156/65   Pulse: 75  77   Temp: 98.4 F (36.9 C)  98.8 F (37.1 C)   TempSrc: Oral  Oral   Resp: 16  16   Height:      Weight:   92 lb 4.8 oz (41.867 kg)   SpO2: 97% 97% 96% 93%   Weight change: 1 lb 4.8 oz (0.59 kg)  Intake/Output Summary (Last 24 hours) at 06/28/14 0810 Last data filed at 06/28/14 0451  Gross per 24 hour  Intake    840 ml  Output   2450 ml  Net  -1610 ml   Gen: A&O x 4, no acute distress, very thin and frail, resting in bed HEENT: Atraumatic, PERRL, EOMI, sclerae anicteric, moist mucous membranes Heart: Regular rate and rhythm, normal S1 S2, no murmurs, rubs, or gallops Lungs: CTAB, respirations unlabored Abd: Soft, non-tender, non-distended, + bowel sounds, no hepatosplenomegaly Ext: No edema or cyanosis, some erythema on L dorsal hand without tenderness or deformity   Lab Results: Basic Metabolic Panel:  Recent Labs Lab 06/26/14 1941 06/27/14 0248  NA 141 140  K 4.3 3.8  CL 98 97  CO2 32 32  GLUCOSE 112* 140*  BUN 12 9  CREATININE 0.34* 0.39*  CALCIUM 8.0* 8.1*  MG  --  1.9   Liver Function Tests:  Recent Labs Lab 06/27/14 0248  AST 18  ALT 13  ALKPHOS 95  BILITOT 0.1*  PROT 6.1  ALBUMIN 3.1*   CBC:  Recent Labs Lab 06/26/14 2329 06/27/14 0248  WBC 7.8 5.8  NEUTROABS 7.5 5.3  HGB 9.8* 9.8*  HCT 32.6* 31.8*  MCV 99.7 97.8  PLT 541* 525*   Cardiac Enzymes:  Recent Labs Lab 06/27/14 0520  TROPONINI <0.03   Urinalysis:  Recent Labs Lab 06/27/14 0550  COLORURINE YELLOW  LABSPEC 1.016  PHURINE 7.5  GLUCOSEU NEGATIVE  HGBUR NEGATIVE  BILIRUBINUR NEGATIVE  KETONESUR NEGATIVE  PROTEINUR NEGATIVE    UROBILINOGEN 0.2  NITRITE NEGATIVE  LEUKOCYTESUR NEGATIVE   Micro Results: No results found for this or any previous visit (from the past 240 hour(s)). Studies/Results: Dg Chest 2 View  06/26/2014   CLINICAL DATA:  Increasing shortness of breath for 4 days. History of COPD.  EXAM: CHEST  2 VIEW  COMPARISON:  05/01/2014  FINDINGS: Hyperinflation with severe apical predominant emphysema. Cardiomediastinal contours are normal. No pulmonary edema, consolidation, pleural effusion or pneumothorax. Unchanged scoliotic curvature of spine.  IMPRESSION: Emphysema without acute process.   Electronically Signed   By: Jeb Levering M.D.   On: 06/26/2014 20:19   Medications: I have reviewed the patient's current medications. Scheduled Meds: . budesonide (PULMICORT) nebulizer solution  0.25 mg Nebulization BID  . calcium carbonate  1,250 mg Oral BID WC  . cholecalciferol  1,000 Units Oral Daily  . dextromethorphan-guaiFENesin  1 tablet Oral BID  . DULoxetine  30 mg Oral BID  . enoxaparin (LOVENOX) injection  30 mg Subcutaneous Daily  . fluticasone  1 spray Each Nare Daily  . ipratropium-albuterol  3 mL Nebulization Q4H  . [START ON 06/29/2014] levofloxacin  500 mg Oral Q48H  . loratadine  10 mg Oral Daily  .  oxyCODONE-acetaminophen  1 tablet Oral Once  . pantoprazole  40 mg Oral Daily  . predniSONE  40 mg Oral Q breakfast  . sodium chloride  3 mL Intravenous Q12H   Continuous Infusions:  PRN Meds:.albuterol, bisacodyl, diphenhydrAMINE, ondansetron (ZOFRAN) IV, oxyCODONE, zolpidem Assessment/Plan: Principal Problem:   COPD exacerbation Active Problems:   Bronchitis, allergic   Seasonal allergies   Dyslipidemia   Chronic pain   GERD (gastroesophageal reflux disease)  COPD Exacerbation: [name] says her SOB, wheezing, productive cough have continued to improve after receiving solumedrol 125 mg iv once then 60 mg iv yesterday, two days of antibiotics, and scheduled duonebs but are still  present. On exam her lungs are clear and she is sating well on 3.5L. This was likely brought on by URI.  - Duonebs q4 hours - Albuterol inhaler q2 hours PRN - Pulmicort 0.25 mg neb bid - Prednisone 40 mg po daily - Levaquin per pharmacy - Mucinex for cough - Cardiac monitoring - Supplemental O2 with goal 92% saturation  Seasonal Allergies: Active currently. - Benadryl 25 mg PRN - Flonase 1 spray each nare - Claritin 10 mg daily  Nausea and Constipation: [name] says her nonspecific abdominal discomfort this morning is improved since starting PPI as recommended by Dr Sharlett Iles of GI who says she is not a candidate for scoping. May be due to chronic opioid use or advancing pulmonary problems. - Zofran 4 mg IV q6 hours PRN - Protonix 40 mg po daily - Dulcolax suppository PRN - Can consider outpatient gastric emptying study (suggested by GI in prior notes)  Iron Deficiency Anemia: Hemoglobin 9.8 on presentation, which is near baseline. Hospitalization in 03/2014 for hemoglobin of 5.2.  Osteoarthritis and Chronic Pain: - Oxy IR 15 mg q6 hours PRN  Bipolar Disorder: There was question whether she was on zyprexa, which is not on medication list, as last night she thought she was. This morning she thinks it was only cymbalta 30 mg bid which is on her list. Has a history of auditory hallucinations which have instructed her to harm herself in past; no active depression or symptoms. - Continue cymbalta 30 mg BID (home medication)  Osteoporosis:  - Continue calcium carbonate 1250 mg BID with meals  Diet: HH  DVT Ppx: SCDs and [place] lovenox  Code Status: DNR/DNI  Dispo: Disposition is deferred at this time, awaiting improvement of current medical problems.  Anticipated discharge in approximately 0-1 day(s).   The patient does have a current PCP (Rushil Sherrye Payor, MD) and does need an [facility] hospital follow-up appointment after discharge.  The patient does not know have transportation  limitations that hinder transportation to clinic appointments.  .Services Needed at time of discharge: Y = Yes, Blank = No PT:   OT:   RN:   Equipment:   Other:     LOS: 2 days   Kelby Aline, MD 06/28/2014, 8:10 AM

## 2014-07-01 DIAGNOSIS — M25552 Pain in left hip: Secondary | ICD-10-CM | POA: Diagnosis not present

## 2014-07-01 DIAGNOSIS — M25551 Pain in right hip: Secondary | ICD-10-CM | POA: Diagnosis not present

## 2014-07-01 DIAGNOSIS — G603 Idiopathic progressive neuropathy: Secondary | ICD-10-CM | POA: Diagnosis not present

## 2014-07-01 DIAGNOSIS — G8929 Other chronic pain: Secondary | ICD-10-CM | POA: Diagnosis not present

## 2014-07-01 DIAGNOSIS — M5441 Lumbago with sciatica, right side: Secondary | ICD-10-CM | POA: Diagnosis not present

## 2014-07-01 DIAGNOSIS — M545 Low back pain: Secondary | ICD-10-CM | POA: Diagnosis not present

## 2014-07-01 DIAGNOSIS — G541 Lumbosacral plexus disorders: Secondary | ICD-10-CM | POA: Diagnosis not present

## 2014-07-02 ENCOUNTER — Encounter: Payer: Self-pay | Admitting: *Deleted

## 2014-07-03 ENCOUNTER — Ambulatory Visit (INDEPENDENT_AMBULATORY_CARE_PROVIDER_SITE_OTHER): Payer: Medicare Other | Admitting: Pulmonary Disease

## 2014-07-03 ENCOUNTER — Encounter: Payer: Self-pay | Admitting: Pulmonary Disease

## 2014-07-03 VITALS — BP 152/70 | HR 77 | Temp 98.8°F | Ht 60.0 in | Wt 89.0 lb

## 2014-07-03 DIAGNOSIS — J9611 Chronic respiratory failure with hypoxia: Secondary | ICD-10-CM | POA: Diagnosis not present

## 2014-07-03 DIAGNOSIS — J438 Other emphysema: Secondary | ICD-10-CM | POA: Diagnosis not present

## 2014-07-03 MED ORDER — BUDESONIDE 0.5 MG/2ML IN SUSP: 0.5000 mg | Freq: Two times a day (BID) | RESPIRATORY_TRACT | Status: DC

## 2014-07-03 MED ORDER — IPRATROPIUM-ALBUTEROL 0.5-2.5 (3) MG/3ML IN SOLN: RESPIRATORY_TRACT | Status: DC

## 2014-07-03 NOTE — Assessment & Plan Note (Signed)
The patient more than likely has severe COPD on clinical grounds, but she has continued to not show up for her multiple scheduled PFTs.  She has had recurrent exacerbations requiring hospitalization secondary to noncompliance with medications, and also ongoing smoking. She was discharged from the hospital last week, and feels that she is slowly getting back to baseline. She still has a lot of dyspnea on exertion and cough with mucus production. I will try and get her back on nebulized bronchodilators alone, and she tells me she was doing much better on that. I also have stressed to her the importance of total smoking cessation, and the need to keep up with her follow-up visits. I will see her back in 4 weeks, and do spirometry on that day to at least get some idea as to her degree of airflow obstruction.

## 2014-07-03 NOTE — Patient Instructions (Addendum)
Stop advair and atrovent solution in nebulizer.  Also stop albuterol solution in neb machine.  Start on duoneb treatments 4 times a day no matter what (breakfast, lunch, dinner, bedtime).  Can take 2 additional treatments if needed on bad days Add budesonide 0.5mg  to the morning and afternoon treatment only. Can still use proair inhaler when away from home if having a hard time breathing. Work on total smoking cessation. followup again in 4 weeks.

## 2014-07-03 NOTE — Progress Notes (Signed)
   Subjective:    Patient ID: [name], female    DOB: 01-31-1951, 64 y.o.   MRN: [id number]  HPI The patient comes in today for follow-up of presumed COPD and chronic respiratory failure. She has been noncompliant with her follow-up visits, medications, and ongoing smoking. She has had recurrent COPD exacerbations requiring hospitalization, and just got out of the hospital last week. Although she is better, her recovery has been fairly slow. Unfortunately, she continues to smoke. She also has a lot of medication redundancy that we will have to work through. She is extremely weak since the hospitalization, and is continuing to cough up some mucus that is intermittently discolored.   Review of Systems  Constitutional: Negative for fever and unexpected weight change.  HENT: Positive for congestion, postnasal drip and rhinorrhea. Negative for dental problem, ear pain, nosebleeds, sinus pressure, sneezing, sore throat and trouble swallowing.   Eyes: Negative for redness and itching.  Respiratory: Positive for cough, chest tightness, shortness of breath and wheezing.   Cardiovascular: Negative for palpitations and leg swelling.  Gastrointestinal: Negative for nausea and vomiting.  Genitourinary: Negative for dysuria.  Musculoskeletal: Negative for joint swelling.  Skin: Negative for rash.  Neurological: Negative for headaches.  Hematological: Does not bruise/bleed easily.  Psychiatric/Behavioral: Negative for dysphoric mood. The patient is not nervous/anxious.        Objective:   Physical Exam Frail-appearing female in no acute distress Nose without purulence or discharge noted Neck without lymphadenopathy or thyromegaly Chest with very diminished breath sounds, no true wheezing, prominent upper airway pseudo wheezing. Cardiac exam with distant heart sounds but regular Lower extremities with minimal edema, no cyanosis Alert and oriented, moves all 4 extremities.       Assessment  & Plan:

## 2014-07-04 ENCOUNTER — Telehealth: Payer: Self-pay | Admitting: Pulmonary Disease

## 2014-07-04 NOTE — Telephone Encounter (Signed)
Called and spoke to pt. Pt stated she was started on Budesonide and she cannot afford the monthly payment of $70. Informed pt to call her insurance to find out what medication similar to Budesonide is at lower tier so she can afford it. Pt verbalized understanding and denied any further questions or concerns at this time.   Pt will call back once speaking to insurance will sign off this message and a new note will be generated when pt calls back.

## 2014-07-10 ENCOUNTER — Encounter: Payer: Self-pay | Admitting: Internal Medicine

## 2014-07-10 ENCOUNTER — Ambulatory Visit (INDEPENDENT_AMBULATORY_CARE_PROVIDER_SITE_OTHER): Payer: Medicare Other | Admitting: Internal Medicine

## 2014-07-10 VITALS — BP 140/67 | HR 84 | Temp 98.6°F | Wt 91.4 lb

## 2014-07-10 DIAGNOSIS — G894 Chronic pain syndrome: Secondary | ICD-10-CM | POA: Diagnosis not present

## 2014-07-10 DIAGNOSIS — J449 Chronic obstructive pulmonary disease, unspecified: Secondary | ICD-10-CM | POA: Diagnosis not present

## 2014-07-10 DIAGNOSIS — R112 Nausea with vomiting, unspecified: Secondary | ICD-10-CM

## 2014-07-10 DIAGNOSIS — J439 Emphysema, unspecified: Secondary | ICD-10-CM

## 2014-07-10 DIAGNOSIS — M25551 Pain in right hip: Secondary | ICD-10-CM | POA: Diagnosis not present

## 2014-07-10 DIAGNOSIS — K219 Gastro-esophageal reflux disease without esophagitis: Secondary | ICD-10-CM

## 2014-07-10 DIAGNOSIS — Z7951 Long term (current) use of inhaled steroids: Secondary | ICD-10-CM

## 2014-07-10 DIAGNOSIS — M25552 Pain in left hip: Secondary | ICD-10-CM

## 2014-07-10 DIAGNOSIS — G8929 Other chronic pain: Secondary | ICD-10-CM

## 2014-07-10 DIAGNOSIS — F17200 Nicotine dependence, unspecified, uncomplicated: Secondary | ICD-10-CM

## 2014-07-10 DIAGNOSIS — K5900 Constipation, unspecified: Secondary | ICD-10-CM

## 2014-07-10 MED ORDER — NAPROXEN 500 MG PO TABS: 500.0000 mg | ORAL_TABLET | Freq: Two times a day (BID) | ORAL | Status: DC

## 2014-07-10 NOTE — Patient Instructions (Signed)
-   Take Naproxen 500 mg twice a day for your hip pain - Please follow up with Dr. Gwenette Greet in May for your COPD - Clarify which nebulizer treatments you should be taking at your next appointment with Dr. Gwenette Greet   General Instructions:   Thank you for bringing your medicines today. This helps [place] keep you safe from mistakes.   Progress Toward Treatment Goals:  No flowsheet data found.  Self Care Goals & Plans:  Self Care Goal 03/27/2014  Manage my medications take my medicines as prescribed; bring my medications to every visit; refill my medications on time; follow the sick day instructions if I am sick  Monitor my health -  Eat healthy foods eat more vegetables; eat fruit for snacks and desserts; eat foods that are low in salt; eat baked foods instead of fried foods; eat smaller portions  Be physically active find an activity I enjoy  Stop smoking -    No flowsheet data found.   Care Management & Community Referrals:  No flowsheet data found.

## 2014-07-11 ENCOUNTER — Encounter: Payer: Self-pay | Admitting: Internal Medicine

## 2014-07-11 NOTE — Assessment & Plan Note (Signed)
Patient reports she is no longer having nausea and constipation as she was in the hospital. - Continue to monitor - She refused to take Protonix

## 2014-07-11 NOTE — Assessment & Plan Note (Signed)
Advised patient that I would not be prescribing opiates as she is seen at a pain clinic. I prescribed her Naproxen 500 mg BID for her hip pain.

## 2014-07-11 NOTE — Progress Notes (Signed)
Patient ID: [name], female   DOB: 13-Aug-1950, 64 y.o.   MRN: [id number]  I am completing paperwork today from [place] that requires my signature:  Received 07/09/14: completion of their cardiopulm rehab program  Evaluation Timed up and go: 49.9s [>14s with increased fall risk] 2-min walk test: 60 ft Rating of perceived exertion: 14-Borg [1 Borg = 10 bpm] Pain: 6/10  Discharge Timed up and go: 20s 2-min walk test: 80 ft Rating of perceived exertion: 13-Borg Pain: 6/10  I will reiterate these findings to her at her next visit.

## 2014-07-11 NOTE — Assessment & Plan Note (Signed)
Patient is at her baseline. I had Dr. Maudie Mercury (pharmacy) call into her pharmacy to see which nebulizers/inhalers she has filled recently. She filled her Advair and ProAir inhalers and duonebs. Budesonide nebs noted to be too expensive.  - f/u with Dr. Gwenette Greet in May - f.u with PCP in June

## 2014-07-11 NOTE — Progress Notes (Signed)
   Subjective:    Patient ID: [name], female    DOB: 07-May-1950, 64 y.o.   MRN: [id number]  HPI [name] is a 64yo woman with PMHx of COPD on home oxygen, GERD, bipolar disorder, and hyperlipidemia who presents today for a hospital follow up.  Patient was hospitalized from 4/14-4/16 for a COPD exacerbation. She was discharged on a 5 day prednisone 40 gm daily course which she completed. She saw Dr. Gwenette Greet (pulm) on 4/21 and he was trying to get her back on nebulized bronchodilators alone (no inhalers). Patient reports she is using albuterol, atrovent, and duoneb nebulizers, and albuterol and advair inhalers. She reports her dyspnea is at baseline, but notes cough and wheezing. She is currently still smoking.   Patient also had nausea and constipation in the hospital. She was prescribed Protonix on discharge but states she has not been taking this med. She states she is no longer having issues with constipation and nausea. She reports her last BM was yesterday and was normal. She reports daily BMs since discharge from the hospital.   Patient reports having bilateral hip pain with the left side worse than the right. She states this has been ongoing for the last 4-5 years. She states she hasn't slept well lately due to the pain. She reports that she is "on vacation" from her pain clinic. She states she has been taking Gabapentin 300 mg daily for pain but that it is not helping much.    Review of Systems General: Denies fever, chills, night sweats, changes in weight, changes in appetite HEENT: Denies headaches, ear pain, changes in vision, rhinorrhea, sore throat CV: Denies CP, palpitations, orthopnea Pulm: See above  GI: Denies abdominal pain, vomiting, diarrhea, melena, hematochezia GU: Denies dysuria, hematuria, frequency Msk: Denies muscle cramps, joint pains Neuro: Denies weakness, numbness, tingling Skin: Denies rashes, bruising    Objective:   Physical Exam General: thin,  elderly woman on oxygen via [place] sitting up in chair, NAD HEENT: [place]/AT, EOMI, mucus membranes moist  CV: RRR, no m/g/r Pulm: mild end expiratory wheezes appreciated, breathing comfortably on 3 L oxygen via  Abd: BS+, soft, non-tender, non-distended Ext: warm, no edema, moves all Neuro: alert and oriented x 3, no focal deficits      Assessment & Plan:  Please refer to A&P documentation.

## 2014-07-12 DIAGNOSIS — J449 Chronic obstructive pulmonary disease, unspecified: Secondary | ICD-10-CM | POA: Diagnosis not present

## 2014-07-14 NOTE — Progress Notes (Signed)
Internal Medicine Clinic Attending  Case discussed with Dr. Rivet at the time of the visit.  We reviewed the resident's history and exam and pertinent patient test results.  I agree with the assessment, diagnosis, and plan of care documented in the resident's note.  

## 2014-07-16 DIAGNOSIS — J449 Chronic obstructive pulmonary disease, unspecified: Secondary | ICD-10-CM | POA: Diagnosis not present

## 2014-07-17 ENCOUNTER — Ambulatory Visit (INDEPENDENT_AMBULATORY_CARE_PROVIDER_SITE_OTHER): Payer: Medicare Other | Admitting: Internal Medicine

## 2014-07-17 ENCOUNTER — Encounter: Payer: Self-pay | Admitting: Internal Medicine

## 2014-07-17 VITALS — BP 141/54 | HR 84 | Temp 98.9°F | Ht 60.0 in | Wt 90.7 lb

## 2014-07-17 DIAGNOSIS — R1084 Generalized abdominal pain: Secondary | ICD-10-CM

## 2014-07-17 LAB — COMPREHENSIVE METABOLIC PANEL
ALBUMIN: 3.9 g/dL (ref 3.5–5.2)
ALT: 14 U/L (ref 0–35)
AST: 14 U/L (ref 0–37)
Alkaline Phosphatase: 91 U/L (ref 39–117)
BUN: 11 mg/dL (ref 6–23)
CHLORIDE: 102 meq/L (ref 96–112)
CO2: 25 meq/L (ref 19–32)
CREATININE: 0.56 mg/dL (ref 0.50–1.10)
Calcium: 8.4 mg/dL (ref 8.4–10.5)
GLUCOSE: 67 mg/dL — AB (ref 70–99)
POTASSIUM: 4.7 meq/L (ref 3.5–5.3)
Sodium: 142 mEq/L (ref 135–145)
Total Bilirubin: 0.2 mg/dL (ref 0.2–1.2)
Total Protein: 6.7 g/dL (ref 6.0–8.3)

## 2014-07-17 LAB — CBC WITH DIFFERENTIAL/PLATELET
BASOS ABS: 0 10*3/uL (ref 0.0–0.1)
BASOS PCT: 0 % (ref 0–1)
Eosinophils Absolute: 0.1 10*3/uL (ref 0.0–0.7)
Eosinophils Relative: 1 % (ref 0–5)
HCT: 33.3 % — ABNORMAL LOW (ref 36.0–46.0)
Hemoglobin: 10.9 g/dL — ABNORMAL LOW (ref 12.0–15.0)
Lymphocytes Relative: 31 % (ref 12–46)
Lymphs Abs: 2.7 10*3/uL (ref 0.7–4.0)
MCH: 31.1 pg (ref 26.0–34.0)
MCHC: 32.7 g/dL (ref 30.0–36.0)
MCV: 94.9 fL (ref 78.0–100.0)
MPV: 8.5 fL — ABNORMAL LOW (ref 8.6–12.4)
Monocytes Absolute: 0.7 10*3/uL (ref 0.1–1.0)
Monocytes Relative: 8 % (ref 3–12)
NEUTROS PCT: 60 % (ref 43–77)
Neutro Abs: 5.2 10*3/uL (ref 1.7–7.7)
PLATELETS: 550 10*3/uL — AB (ref 150–400)
RBC: 3.51 MIL/uL — AB (ref 3.87–5.11)
RDW: 16.7 % — ABNORMAL HIGH (ref 11.5–15.5)
WBC: 8.7 10*3/uL (ref 4.0–10.5)

## 2014-07-17 MED ORDER — PANTOPRAZOLE SODIUM 40 MG PO TBEC: 40.0000 mg | DELAYED_RELEASE_TABLET | Freq: Two times a day (BID) | ORAL | Status: DC

## 2014-07-17 MED ORDER — ONDANSETRON 4 MG PO TBDP: 4.0000 mg | ORAL_TABLET | Freq: Three times a day (TID) | ORAL | Status: DC | PRN

## 2014-07-17 NOTE — Assessment & Plan Note (Addendum)
Patient has very generalized abdominal pain with no red flag symptoms at this time. She's had previous imaging including an MRCP and CT abdomen back in 2014 that showed some common bile duct dilations but with no stone. She's had a history of elevated LFTs in the past. She has end-stage COPD the puts her at increased risk for complications and was refused colonoscopy and EGD when referred to GI given her situation. The patient is in agreement to not pursue generalize anesthesia. A lot of her symptoms seem to relate to GERD with the diffuse abdominal pain, ongoing sour taste every morning, and could be worsened by her 2 cigars daily which she usually smokes before going to bed. A viral gastroenteritis could also be a possibility for the acute change in the patient's chronic symptoms. She has no systemic signs of ongoing serious infection. She does have a very easily reducible abdominal hernia that shows no signs of exsanguination. Wt Readings from Last 3 Encounters:  07/17/14 90 lb 11.2 oz (41.141 kg)  07/10/14 91 lb 6.4 oz (41.459 kg)  07/03/14 89 lb (40.37 kg)   -CBC and CMet Zofran dissolvable tablets -Protonix 40 mg twice a day -Follow-up in one month

## 2014-07-17 NOTE — Patient Instructions (Signed)
General Instructions:   Please bring your medicines with you each time you come to clinic.  Medicines may include prescription medications, over-the-counter medications, herbal remedies, eye drops, vitamins, or other pills.   For your belly pain:  1. Liver tests before doing any imaging 2. Nausea pill that dissolves on tongue 3. Acid pill take twice a day 4. Follow up in 2-4 wks or if your not feeling better    Progress Toward Treatment Goals:  No flowsheet data found.  Self Care Goals & Plans:  Self Care Goal 07/17/2014  Manage my medications take my medicines as prescribed; bring my medications to every visit; refill my medications on time  Monitor my health -  Eat healthy foods drink diet soda or water instead of juice or soda; eat more vegetables; eat foods that are low in salt; eat baked foods instead of fried foods; eat fruit for snacks and desserts  Be physically active -  Stop smoking -    No flowsheet data found.   Care Management & Community Referrals:  No flowsheet data found.

## 2014-07-17 NOTE — Progress Notes (Signed)
Subjective:   Patient ID: [name] female   DOB: 10-23-1950 64 y.o.   MRN: [id number]  HPI: [name] is a 64 y.o. woman with a past medical history as listed below presents for ongoing abdominal pain.  This has been a long-standing problem for at least 2+ years for the patient. She has not had any over-the-counter medications to relieve the pain. She states that this most recent episode exacerbated about 2 months ago with some increasing bloating, diffuse abdominal pain, and vomiting that only started yesterday. She has not had any diarrhea hematochezia or fever. She has been actively working to gain weight and has successfully done so by eating healthier. She describes a sour taste every morning in her mouth and continues to smoke 2 cigarettes per day. She had previously tried dissolving Zofran which completely resolved her symptoms.  Past Medical History  Diagnosis Date  . Depression   . Suicidal ideation 2007     attempted overdose 2012  Dr. Tivis Ringer report  . Bipolar disorder   . Substance abuse      narcotics, alcohol, tobacco  . Chronic pain syndrome     follows at [facility]  . DEFICIENCY, VITAMIN D NOS 01/03/2007  . Chronic lower back pain   . Anxiety   . OSTEOPOROSIS 06/17/2009    DEXA 05/2009 : L femur -2.9; R femur -2.5. Alendronate on med list but not taking. Needs addressed ASAP as h/o fractures.    . Elevated liver function tests   . On home oxygen therapy     "3L; 24/7" (03/20/2014)  . Shortness of breath     "all the time" (02/27/2013)  . 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 Sciatic pain   . Anemia   . Pneumonia X 1?  . Hyperthyroidism     "borderline"  . History of blood transfusion     "getting my 1st later tonight" (03/20/2014)  . Arthritis     "legs, back; hips, primarily left hip" (03/20/2014)   Current Outpatient Prescriptions  Medication Sig Dispense Refill  . albuterol  (PROVENTIL) (2.5 MG/3ML) 0.083% nebulizer solution Take 3 mLs (2.5 mg total) by nebulization every 4 (four) hours. Shortness of breath (Patient taking differently: Take 2.5 mg by nebulization 4 (four) times daily. Can take 2 additional treatments if needed) 360 mL 12  . budesonide (PULMICORT) 0.5 MG/2ML nebulizer solution Take 2 mLs (0.5 mg total) by nebulization 2 (two) times daily. DX j43.8 (Patient not taking: Reported on 07/10/2014) 120 mL 3  . cetirizine (ZYRTEC) 10 MG tablet Take 10 mg by mouth daily.    . diphenhydrAMINE (BENADRYL) 25 MG tablet Take 25 mg by mouth as needed.     . DULoxetine (CYMBALTA) 30 MG capsule Take 30 mg by mouth 2 (two) times daily.     . fluticasone (FLONASE) 50 MCG/ACT nasal spray Place 1 spray into both nostrils daily. 16 g 1  . Fluticasone-Salmeterol (ADVAIR DISKUS) 250-50 MCG/DOSE AEPB INHALE 1 PUFF INTO THE LUNGS 2 TIMES DAILY 5 each 6  . gabapentin (NEURONTIN) 300 MG capsule 1 caps 3 times daily    . ipratropium (ATROVENT) 0.02 % nebulizer solution Take 2.5 mLs (0.5 mg total) by nebulization 2 (two) times daily. 150 mL 12  . ipratropium-albuterol (DUONEB) 0.5-2.5 (3) MG/3ML SOLN 4 times a day (breakfast, lunch, dinner, bedtime).Can take 2 additional treatments if needed on bad days. DX J43.8 360 mL 3  .  lamoTRIgine (LAMICTAL) 100 MG tablet Take 100 mg by mouth daily.    . naproxen (NAPROSYN) 500 MG tablet Take 1 tablet (500 mg total) by mouth 2 (two) times daily with a meal. 60 tablet 0  . OLANZapine (ZYPREXA) 5 MG tablet Take 5 mg by mouth at bedtime.    . ondansetron (ZOFRAN ODT) 4 MG disintegrating tablet Take 1 tablet (4 mg total) by mouth every 8 (eight) hours as needed for nausea or vomiting. 40 tablet 0  . pantoprazole (PROTONIX) 40 MG tablet Take 1 tablet (40 mg total) by mouth 2 (two) times daily. 60 tablet 1  . PROAIR HFA 108 (90 BASE) MCG/ACT inhaler INHALE ONE PUFF BY MOUTH EVERY 2 TO 4 HOURS AS NEEDED FOR WHEEZING 8.5 g 12  . zolpidem (AMBIEN) 5 MG  tablet Take 5 mg by mouth at bedtime as needed for sleep.     No current facility-administered medications for this visit.   Family History  Problem Relation Age of Onset  . Stroke Neg Hx   . Cancer Neg Hx   . Myelodysplastic syndrome Father     Died from myelofibrosis though diagnosed post-mortem   History   Social History  . Marital Status: Divorced    Spouse Name: N/A  . Number of Children: 2  . Years of Education: N/A   Occupational History  . retired    Social History Main Topics  . Smoking status: Current Every Day Smoker -- 0.20 packs/day for 45 years    Types: Cigarettes  . Smokeless tobacco: Never Used     Comment: 03/20/2014 "smoking 2 cigarettes/day; that's down from 2 ppd"  . Alcohol Use: 0.0 oz/week    0 Standard drinks or equivalent per week     Comment: 03/20/2014 "nothing in 7 years; never had a problem w/it"  . Drug Use: No  . Sexual Activity: No   Other Topics Concern  . 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 Objective:  Physical Exam: Filed Vitals:   07/17/14 1340  BP: 141/54  Pulse: 84  Temp: 98.9 F (37.2 C)  TempSrc: Oral  Height: 5' (1.524 m)  Weight: 90 lb 11.2 oz (41.141 kg)  SpO2: 100%   General: Sitting in chair, on nasal cannula oxygen, very cachectic HEENT: PERRL, EOMI, no scleral icterus, oropharynx slight erythema no tonsillar exudates Cardiac: RRR, no rubs, murmurs or gallops Pulm: clear to auscultation bilaterally, moving normal volumes of air Abd: soft, diffusely tender, no rebound or guarding, slight distention, BS present, no fluid wave, negative Murphy's, large reducible abdominal midline hernia Ext: warm and well perfused, no pedal edema Neuro: alert and oriented X3,  cranial nerves II-XII grossly intact  Assessment & Plan:  Please see problem oriented charting  Pt discussed with Dr. Beryle Beams

## 2014-07-20 NOTE — Progress Notes (Signed)
Medicine attending: Medical history, presenting problems, physical findings, and medications, reviewed with Dr Clinton Gallant on the day of the patient visit and I concur with her evaluation and management plan.

## 2014-07-24 NOTE — Addendum Note (Signed)
Addended by: Hulan Fray on: 07/24/2014 07:27 PM   Modules accepted: Orders

## 2014-07-29 DIAGNOSIS — M25551 Pain in right hip: Secondary | ICD-10-CM | POA: Diagnosis not present

## 2014-07-29 DIAGNOSIS — G8929 Other chronic pain: Secondary | ICD-10-CM | POA: Diagnosis not present

## 2014-07-29 DIAGNOSIS — M25552 Pain in left hip: Secondary | ICD-10-CM | POA: Diagnosis not present

## 2014-07-29 DIAGNOSIS — M545 Low back pain: Secondary | ICD-10-CM | POA: Diagnosis not present

## 2014-07-29 DIAGNOSIS — M25511 Pain in right shoulder: Secondary | ICD-10-CM | POA: Diagnosis not present

## 2014-07-30 ENCOUNTER — Encounter: Payer: Self-pay | Admitting: Internal Medicine

## 2014-07-30 NOTE — Progress Notes (Addendum)
Patient ID: [name], female   DOB: Mar 04, 1951, 64 y.o.   MRN: [id number]  I am completing paperwork today from [place] that requires my signature:  Received 07/29/14: Summary of her discharge from home physical therapy and home health  Received 07/29/14: Request for extension of physical therapy as the patient is showing good motivation and progression towards her goals

## 2014-07-31 ENCOUNTER — Encounter: Payer: Self-pay | Admitting: Pulmonary Disease

## 2014-07-31 ENCOUNTER — Ambulatory Visit (INDEPENDENT_AMBULATORY_CARE_PROVIDER_SITE_OTHER): Payer: Medicare Other | Admitting: Pulmonary Disease

## 2014-07-31 VITALS — BP 118/68 | HR 72 | Temp 99.5°F

## 2014-07-31 DIAGNOSIS — J438 Other emphysema: Secondary | ICD-10-CM

## 2014-07-31 DIAGNOSIS — J9611 Chronic respiratory failure with hypoxia: Secondary | ICD-10-CM

## 2014-07-31 DIAGNOSIS — J441 Chronic obstructive pulmonary disease with (acute) exacerbation: Secondary | ICD-10-CM | POA: Diagnosis not present

## 2014-07-31 MED ORDER — CEFDINIR 300 MG PO CAPS: ORAL_CAPSULE | ORAL | Status: DC

## 2014-07-31 MED ORDER — PREDNISONE 10 MG PO TABS: ORAL_TABLET | ORAL | Status: DC

## 2014-07-31 NOTE — Progress Notes (Signed)
   Subjective:    Patient ID: [name], female    DOB: 11-02-1950, 64 y.o.   MRN: [id number]  HPI The patient comes in today for an acute sick visit. She has severe COPD that is presumed, as well as chronic respiratory failure. She never shows up for any of her pulmonary function tests that are scheduled, and therefore we have not risk stratified her. We changed her from inhaled to nebulized bronchodilators at the last visit, but unfortunately she has not followed my recommendations. She continues to smoke, and is using Advair in addition to her nebulized medications. She never filled her budesonide prescription. She comes in today with a one to 2 day history of worsening chest congestion, cough with brown mucus, and increased shortness of breath. She is on pulsed oxygen with very low oxygen saturations, and obviously will need to be on continuous in the future. On 4 L continuous here in the office she came up to 90%.   Review of Systems  Constitutional: Negative for fever and unexpected weight change.  HENT: Positive for congestion and postnasal drip. Negative for dental problem, ear pain, nosebleeds, rhinorrhea, sinus pressure, sneezing, sore throat and trouble swallowing.   Eyes: Negative for redness and itching.  Respiratory: Positive for cough, chest tightness, shortness of breath and wheezing.   Cardiovascular: Negative for palpitations and leg swelling.  Gastrointestinal: Negative for nausea and vomiting.  Genitourinary: Negative for dysuria.  Musculoskeletal: Negative for joint swelling.  Skin: Negative for rash.  Neurological: Negative for headaches.  Hematological: Does not bruise/bleed easily.  Psychiatric/Behavioral: Negative for dysphoric mood. The patient is not nervous/anxious.        Objective:   Physical Exam Frail-appearing female in no acute distress Nose without purulence or discharge noted Neck without lymphadenopathy or thyromegaly Chest with very diminished  breath sounds, both upper and lower airway wheezing noted. Cardiac exam with regular rate and rhythm Lower extremities without edema, no cyanosis Alert and oriented, moves all 4 extremities.       Assessment & Plan:

## 2014-07-31 NOTE — Assessment & Plan Note (Signed)
The patient is having insufficient oxygen saturations on pulsed oxygen, and we'll therefore change her over to continuous.

## 2014-07-31 NOTE — Assessment & Plan Note (Signed)
The patient is having worsening congestion with probable acute bronchitis, and this is resulting in a COPD exacerbation. She will need a course of antibiotics as well as prednisone in order to keep her out of the hospital. I have stressed to her again the importance of total smoking cessation.

## 2014-07-31 NOTE — Patient Instructions (Addendum)
STOP advair Stay on duonebs by neb machine 4 times a day no matter what.  Can take 2 additional treatments if needed Please FILL budesonide 0.5mg , and put in your nebulizer TWO times a day with the duoneb (morning and afternoon) Can use albuterol inhaler (proair) for rescue when away from home. You must stop smoking to have any chance of staying well.  Will treat with 8 days of prednisone omnicef 300mg , take 2 each am for 5 days. You will not be able to stay on pulsed oxygen.  Will send order to home care company to put you on continuous oxygen at 4 liters.  followup with Dr. Chase Caller in 6 weeks.

## 2014-08-05 ENCOUNTER — Emergency Department (HOSPITAL_COMMUNITY): Payer: Medicare Other

## 2014-08-05 ENCOUNTER — Observation Stay (HOSPITAL_COMMUNITY)
Admission: EM | Admit: 2014-08-05 | Discharge: 2014-08-12 | Disposition: A | Discharge status: Skilled Nursing Facility | Payer: Medicare Other | Attending: Internal Medicine | Admitting: Internal Medicine

## 2014-08-05 ENCOUNTER — Encounter (HOSPITAL_COMMUNITY): Payer: Self-pay | Admitting: *Deleted

## 2014-08-05 DIAGNOSIS — E785 Hyperlipidemia, unspecified: Secondary | ICD-10-CM | POA: Diagnosis not present

## 2014-08-05 DIAGNOSIS — Z9981 Dependence on supplemental oxygen: Secondary | ICD-10-CM | POA: Insufficient documentation

## 2014-08-05 DIAGNOSIS — F419 Anxiety disorder, unspecified: Secondary | ICD-10-CM | POA: Diagnosis not present

## 2014-08-05 DIAGNOSIS — R4182 Altered mental status, unspecified: Principal | ICD-10-CM | POA: Insufficient documentation

## 2014-08-05 DIAGNOSIS — J449 Chronic obstructive pulmonary disease, unspecified: Secondary | ICD-10-CM | POA: Insufficient documentation

## 2014-08-05 DIAGNOSIS — D539 Nutritional anemia, unspecified: Secondary | ICD-10-CM | POA: Diagnosis not present

## 2014-08-05 DIAGNOSIS — R6889 Other general symptoms and signs: Secondary | ICD-10-CM | POA: Diagnosis not present

## 2014-08-05 DIAGNOSIS — J439 Emphysema, unspecified: Secondary | ICD-10-CM | POA: Diagnosis present

## 2014-08-05 DIAGNOSIS — R9431 Abnormal electrocardiogram [ECG] [EKG]: Secondary | ICD-10-CM | POA: Diagnosis not present

## 2014-08-05 DIAGNOSIS — J309 Allergic rhinitis, unspecified: Secondary | ICD-10-CM | POA: Insufficient documentation

## 2014-08-05 DIAGNOSIS — R112 Nausea with vomiting, unspecified: Secondary | ICD-10-CM | POA: Diagnosis present

## 2014-08-05 DIAGNOSIS — K219 Gastro-esophageal reflux disease without esophagitis: Secondary | ICD-10-CM | POA: Diagnosis present

## 2014-08-05 DIAGNOSIS — M810 Age-related osteoporosis without current pathological fracture: Secondary | ICD-10-CM | POA: Diagnosis not present

## 2014-08-05 DIAGNOSIS — F319 Bipolar disorder, unspecified: Secondary | ICD-10-CM | POA: Insufficient documentation

## 2014-08-05 DIAGNOSIS — D509 Iron deficiency anemia, unspecified: Secondary | ICD-10-CM | POA: Diagnosis not present

## 2014-08-05 DIAGNOSIS — F17210 Nicotine dependence, cigarettes, uncomplicated: Secondary | ICD-10-CM | POA: Insufficient documentation

## 2014-08-05 DIAGNOSIS — J441 Chronic obstructive pulmonary disease with (acute) exacerbation: Secondary | ICD-10-CM

## 2014-08-05 DIAGNOSIS — R441 Visual hallucinations: Secondary | ICD-10-CM | POA: Diagnosis not present

## 2014-08-05 DIAGNOSIS — R410 Disorientation, unspecified: Secondary | ICD-10-CM | POA: Diagnosis not present

## 2014-08-05 DIAGNOSIS — R109 Unspecified abdominal pain: Secondary | ICD-10-CM

## 2014-08-05 DIAGNOSIS — G8929 Other chronic pain: Secondary | ICD-10-CM | POA: Diagnosis present

## 2014-08-05 DIAGNOSIS — R05 Cough: Secondary | ICD-10-CM | POA: Diagnosis not present

## 2014-08-05 DIAGNOSIS — R0602 Shortness of breath: Secondary | ICD-10-CM | POA: Diagnosis not present

## 2014-08-05 DIAGNOSIS — D473 Essential (hemorrhagic) thrombocythemia: Secondary | ICD-10-CM | POA: Insufficient documentation

## 2014-08-05 DIAGNOSIS — N200 Calculus of kidney: Secondary | ICD-10-CM | POA: Diagnosis not present

## 2014-08-05 DIAGNOSIS — F3130 Bipolar disorder, current episode depressed, mild or moderate severity, unspecified: Secondary | ICD-10-CM | POA: Diagnosis present

## 2014-08-05 LAB — COMPREHENSIVE METABOLIC PANEL WITH GFR
ALT: 18 U/L (ref 14–54)
AST: 25 U/L (ref 15–41)
Albumin: 3.2 g/dL — ABNORMAL LOW (ref 3.5–5.0)
Alkaline Phosphatase: 76 U/L (ref 38–126)
Anion gap: 11 (ref 5–15)
BUN: <5 mg/dL — ABNORMAL LOW (ref 6–20)
CO2: 30 mmol/L (ref 22–32)
Calcium: 7.9 mg/dL — ABNORMAL LOW (ref 8.9–10.3)
Chloride: 100 mmol/L — ABNORMAL LOW (ref 101–111)
Creatinine, Ser: <0.3 mg/dL — ABNORMAL LOW (ref 0.44–1.00)
Glucose, Bld: 87 mg/dL (ref 65–99)
Potassium: 3.5 mmol/L (ref 3.5–5.1)
Sodium: 141 mmol/L (ref 135–145)
Total Bilirubin: 0.5 mg/dL (ref 0.3–1.2)
Total Protein: 5.8 g/dL — ABNORMAL LOW (ref 6.5–8.1)

## 2014-08-05 LAB — CBC WITH DIFFERENTIAL/PLATELET
Basophils Absolute: 0 10*3/uL (ref 0.0–0.1)
Basophils Relative: 1 % (ref 0–1)
Eosinophils Absolute: 0.1 10*3/uL (ref 0.0–0.7)
Eosinophils Relative: 1 % (ref 0–5)
HCT: 32.3 % — ABNORMAL LOW (ref 36.0–46.0)
Hemoglobin: 9.8 g/dL — ABNORMAL LOW (ref 12.0–15.0)
LYMPHS ABS: 1.2 10*3/uL (ref 0.7–4.0)
LYMPHS PCT: 26 % (ref 12–46)
MCH: 30.8 pg (ref 26.0–34.0)
MCHC: 30.3 g/dL (ref 30.0–36.0)
MCV: 101.6 fL — ABNORMAL HIGH (ref 78.0–100.0)
MONO ABS: 0.5 10*3/uL (ref 0.1–1.0)
MONOS PCT: 10 % (ref 3–12)
Neutro Abs: 2.9 10*3/uL (ref 1.7–7.7)
Neutrophils Relative %: 62 % (ref 43–77)
Platelets: 463 10*3/uL — ABNORMAL HIGH (ref 150–400)
RBC: 3.18 MIL/uL — AB (ref 3.87–5.11)
RDW: 17.1 % — AB (ref 11.5–15.5)
WBC: 4.7 10*3/uL (ref 4.0–10.5)

## 2014-08-05 LAB — URINALYSIS, ROUTINE W REFLEX MICROSCOPIC
Bilirubin Urine: NEGATIVE
Glucose, UA: NEGATIVE mg/dL
Hgb urine dipstick: NEGATIVE
Ketones, ur: 15 mg/dL — AB
Leukocytes, UA: NEGATIVE
Nitrite: NEGATIVE
Protein, ur: NEGATIVE mg/dL
Specific Gravity, Urine: 1.007 (ref 1.005–1.030)
Urobilinogen, UA: 0.2 mg/dL (ref 0.0–1.0)
pH: 6 (ref 5.0–8.0)

## 2014-08-05 LAB — I-STAT ARTERIAL BLOOD GAS, ED
Acid-Base Excess: 5 mmol/L — ABNORMAL HIGH (ref 0.0–2.0)
Bicarbonate: 32.7 mEq/L — ABNORMAL HIGH (ref 20.0–24.0)
O2 SAT: 86 %
PH ART: 7.332 — AB (ref 7.350–7.450)
Patient temperature: 99.7
TCO2: 34 mmol/L (ref 0–100)
pCO2 arterial: 62.1 mmHg (ref 35.0–45.0)
pO2, Arterial: 59 mmHg — ABNORMAL LOW (ref 80.0–100.0)

## 2014-08-05 LAB — LIPASE, BLOOD: Lipase: 15 U/L — ABNORMAL LOW (ref 22–51)

## 2014-08-05 LAB — I-STAT TROPONIN, ED: Troponin i, poc: 0.01 ng/mL (ref 0.00–0.08)

## 2014-08-05 LAB — ETHANOL: Alcohol, Ethyl (B): <5 mg/dL (ref <5)

## 2014-08-05 MED ORDER — LAMOTRIGINE 100 MG PO TABS
100.0000 mg | ORAL_TABLET | Freq: Every day | ORAL | Status: DC
  Administered 2014-08-06 – 2014-08-12 (×7): 100 mg via ORAL
  Filled 2014-08-05 (×9): qty 1

## 2014-08-05 MED ORDER — LEVOFLOXACIN IN D5W 750 MG/150ML IV SOLN
750.0000 mg | Freq: Once | INTRAVENOUS | Status: AC
  Administered 2014-08-05: 750 mg via INTRAVENOUS
  Filled 2014-08-05: qty 150

## 2014-08-05 MED ORDER — GUAIFENESIN ER 600 MG PO TB12
600.0000 mg | ORAL_TABLET | Freq: Two times a day (BID) | ORAL | Status: DC | PRN
  Administered 2014-08-05: 600 mg via ORAL
  Filled 2014-08-05: qty 1

## 2014-08-05 MED ORDER — DIPHENHYDRAMINE HCL 25 MG PO TABS
25.0000 mg | ORAL_TABLET | ORAL | Status: DC | PRN
  Filled 2014-08-05: qty 1

## 2014-08-05 MED ORDER — FLUTICASONE PROPIONATE 50 MCG/ACT NA SUSP
1.0000 | Freq: Every day | NASAL | Status: DC
  Administered 2014-08-06 – 2014-08-12 (×7): 1 via NASAL
  Filled 2014-08-05 (×2): qty 16

## 2014-08-05 MED ORDER — SODIUM CHLORIDE 0.9 % IJ SOLN
3.0000 mL | Freq: Two times a day (BID) | INTRAMUSCULAR | Status: DC
  Administered 2014-08-05: 3 mL via INTRAVENOUS

## 2014-08-05 MED ORDER — ALBUTEROL SULFATE HFA 108 (90 BASE) MCG/ACT IN AERS: 1.0000 | INHALATION_SPRAY | RESPIRATORY_TRACT | Status: DC

## 2014-08-05 MED ORDER — DULOXETINE HCL 30 MG PO CPEP
30.0000 mg | ORAL_CAPSULE | Freq: Two times a day (BID) | ORAL | Status: DC
  Administered 2014-08-05 – 2014-08-07 (×4): 30 mg via ORAL
  Filled 2014-08-05 (×7): qty 1

## 2014-08-05 MED ORDER — ONDANSETRON HCL 4 MG/2ML IJ SOLN: 4.0000 mg | Freq: Four times a day (QID) | INTRAMUSCULAR | Status: DC | PRN

## 2014-08-05 MED ORDER — ENOXAPARIN SODIUM 40 MG/0.4ML ~~LOC~~ SOLN
40.0000 mg | SUBCUTANEOUS | Status: DC
  Administered 2014-08-05 – 2014-08-12 (×8): 40 mg via SUBCUTANEOUS
  Filled 2014-08-05 (×9): qty 0.4

## 2014-08-05 MED ORDER — MOMETASONE FURO-FORMOTEROL FUM 100-5 MCG/ACT IN AERO
2.0000 | INHALATION_SPRAY | Freq: Two times a day (BID) | RESPIRATORY_TRACT | Status: DC
  Administered 2014-08-05 – 2014-08-11 (×11): 2 via RESPIRATORY_TRACT
  Filled 2014-08-05 (×2): qty 8.8

## 2014-08-05 MED ORDER — IOHEXOL 300 MG/ML  SOLN
80.0000 mL | Freq: Once | INTRAMUSCULAR | Status: AC | PRN
  Administered 2014-08-05: 12:00:00 via INTRAVENOUS

## 2014-08-05 MED ORDER — ZOLPIDEM TARTRATE 5 MG PO TABS
5.0000 mg | ORAL_TABLET | Freq: Every evening | ORAL | Status: DC | PRN
  Administered 2014-08-05 – 2014-08-11 (×7): 5 mg via ORAL
  Filled 2014-08-05 (×7): qty 1

## 2014-08-05 MED ORDER — LORATADINE 10 MG PO TABS
10.0000 mg | ORAL_TABLET | Freq: Every day | ORAL | Status: DC
  Administered 2014-08-06 – 2014-08-12 (×7): 10 mg via ORAL
  Filled 2014-08-05 (×7): qty 1

## 2014-08-05 MED ORDER — ONDANSETRON 4 MG PO TBDP
4.0000 mg | ORAL_TABLET | Freq: Three times a day (TID) | ORAL | Status: DC | PRN
  Filled 2014-08-05: qty 1

## 2014-08-05 MED ORDER — IPRATROPIUM-ALBUTEROL 0.5-2.5 (3) MG/3ML IN SOLN
3.0000 mL | Freq: Once | RESPIRATORY_TRACT | Status: AC
  Administered 2014-08-05: 3 mL via RESPIRATORY_TRACT
  Filled 2014-08-05: qty 3

## 2014-08-05 MED ORDER — PANTOPRAZOLE SODIUM 40 MG PO TBEC
40.0000 mg | DELAYED_RELEASE_TABLET | Freq: Two times a day (BID) | ORAL | Status: DC
  Administered 2014-08-05 – 2014-08-12 (×14): 40 mg via ORAL
  Filled 2014-08-05 (×14): qty 1

## 2014-08-05 MED ORDER — IOHEXOL 300 MG/ML  SOLN: 25.0000 mL | Freq: Once | INTRAMUSCULAR | Status: DC | PRN

## 2014-08-05 MED ORDER — MORPHINE SULFATE 4 MG/ML IJ SOLN
4.0000 mg | Freq: Once | INTRAMUSCULAR | Status: AC
  Administered 2014-08-05: 4 mg via INTRAVENOUS
  Filled 2014-08-05: qty 1

## 2014-08-05 MED ORDER — OLANZAPINE 5 MG PO TABS
5.0000 mg | ORAL_TABLET | Freq: Every day | ORAL | Status: DC
  Administered 2014-08-05 – 2014-08-06 (×2): 5 mg via ORAL
  Filled 2014-08-05 (×3): qty 1

## 2014-08-05 MED ORDER — NAPROXEN 250 MG PO TABS: 500.0000 mg | ORAL_TABLET | Freq: Two times a day (BID) | ORAL | Status: DC

## 2014-08-05 MED ORDER — ACETAMINOPHEN 325 MG PO TABS
650.0000 mg | ORAL_TABLET | Freq: Four times a day (QID) | ORAL | Status: DC | PRN
  Administered 2014-08-05 – 2014-08-12 (×21): 650 mg via ORAL
  Filled 2014-08-05 (×21): qty 2

## 2014-08-05 MED ORDER — IPRATROPIUM-ALBUTEROL 0.5-2.5 (3) MG/3ML IN SOLN: 3.0000 mL | RESPIRATORY_TRACT | Status: DC

## 2014-08-05 MED ORDER — BUDESONIDE 0.5 MG/2ML IN SUSP
0.5000 mg | Freq: Four times a day (QID) | RESPIRATORY_TRACT | Status: DC
  Administered 2014-08-05 – 2014-08-12 (×25): 0.5 mg via RESPIRATORY_TRACT
  Filled 2014-08-05 (×26): qty 2

## 2014-08-05 MED ORDER — GABAPENTIN 300 MG PO CAPS: 300.0000 mg | ORAL_CAPSULE | Freq: Three times a day (TID) | ORAL | Status: DC

## 2014-08-05 MED ORDER — METHYLPREDNISOLONE SODIUM SUCC 125 MG IJ SOLR
125.0000 mg | Freq: Once | INTRAMUSCULAR | Status: AC
  Administered 2014-08-05: 125 mg via INTRAVENOUS
  Filled 2014-08-05: qty 2

## 2014-08-05 MED ORDER — IPRATROPIUM-ALBUTEROL 0.5-2.5 (3) MG/3ML IN SOLN
3.0000 mL | Freq: Four times a day (QID) | RESPIRATORY_TRACT | Status: DC
  Administered 2014-08-05 – 2014-08-12 (×25): 3 mL via RESPIRATORY_TRACT
  Filled 2014-08-05 (×24): qty 3
  Filled 2014-08-05: qty 42
  Filled 2014-08-05: qty 3

## 2014-08-05 NOTE — ED Provider Notes (Signed)
CSN: [id number]     Arrival date & time 08/05/14  2423 History   First MD Initiated Contact with Patient 08/05/14 [phone]     Chief Complaint  Patient presents with  . Abdominal Pain   [name] is a 64 y.o. female with a past medical history significant for depression, bipolar disorder, COPD on chronic oxygen therapy, hypertension, pneumonias, and chronic low back pain who presents with confusion and fatigue. The patient reports that for the last 4 days, she has had worsening confusion and generalized fatigue. The patient denies any fevers, chills, chest pain, nausea, vomiting, constipation, diarrhea, dysuria, or rashes. The patient reports that she recently had her oxygen level increased to 4 L. Patient reports a mild increase in shortness of breath and does report a chronic cough. The patient reports a very mild headache in her occiput for the last several days with no associated vision changes, or neurological deficits. The patient denies any new medication changes and reports normal by mouth intake, normal bowel movements. The patient says that she feels too tired to walk around.   (Consider location/radiation/quality/duration/timing/severity/associated sxs/prior Treatment) Patient is a 64 y.o. female presenting with altered mental status. The history is provided by the patient and medical records. No language interpreter was used.  Altered Mental Status Presenting symptoms: confusion   Presenting symptoms: no behavior changes, no combativeness, no disorientation, no partial responsiveness and no unresponsiveness   Severity:  Moderate Most recent episode:  More than 2 days ago (4 days ago) Episode history:  Single Duration:  4 days Timing:  Constant Progression:  Worsening Chronicity:  New Context: not dementia, not head injury and not a recent change in medication   Associated symptoms: depression, headaches and nausea (resolved)   Associated symptoms: no abdominal pain, no fever, no  light-headedness, no palpitations, no rash, no seizures, no slurred speech, no vomiting and no weakness   Headaches:    Severity:  Mild   Onset quality:  Gradual   Duration:  3 days   Timing:  Constant   Progression:  Improving   Chronicity:  Recurrent Nausea:    Severity:  Mild   Onset quality:  Gradual   Duration:  3 days   Timing:  Sporadic   Progression:  Resolved (resolved with zofran)   Past Medical History  Diagnosis Date  . Depression   . Suicidal ideation 2007     attempted overdose 2012  Dr. Tivis Ringer report  . Bipolar disorder   . Substance abuse      narcotics, alcohol, tobacco  . Chronic pain syndrome     follows at [facility]  . DEFICIENCY, VITAMIN D NOS 01/03/2007  . Chronic lower back pain   . Anxiety   . OSTEOPOROSIS 06/17/2009    DEXA 05/2009 : L femur -2.9; R femur -2.5. Alendronate on med list but not taking. Needs addressed ASAP as h/o fractures.    . Elevated liver function tests   . On home oxygen therapy     "3L; 24/7" (03/20/2014)  . Shortness of breath     "all the time" (02/27/2013)  . 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 Sciatic pain   . Anemia   . Pneumonia X 1?  . Hyperthyroidism     "borderline"  . History of blood transfusion     "getting my 1st later tonight" (03/20/2014)  . Arthritis     "legs, back; hips, primarily left  hip" (03/20/2014)   Past Surgical History  Procedure Laterality Date  . Orif ankle fracture Left 10/2010  . Cesarean section  1974; 1979  . Tubal ligation  1979  . Augmentation mammaplasty  ~ 2007  . Fracture surgery    . Ankle debridement Left 10/2010   Family History  Problem Relation Age of Onset  . Stroke Neg Hx   . Cancer Neg Hx   . Myelodysplastic syndrome Father     Died from myelofibrosis though diagnosed post-mortem   History  Substance Use Topics  . Smoking status: Current Every Day Smoker -- 0.20 packs/day for 45 years    Types:  Cigarettes  . Smokeless tobacco: Never Used     Comment: 03/20/2014 "smoking 2 cigarettes/day; that's down from 2 ppd"  . Alcohol Use: 0.0 oz/week    0 Standard drinks or equivalent per week     Comment: 03/20/2014 "nothing in 7 years; never had a problem w/it"   OB History    No data available     Review of Systems  Constitutional: Positive for fatigue. Negative for fever, chills and diaphoresis.  HENT: Negative for congestion and rhinorrhea.   Eyes: Negative for pain and visual disturbance.  Respiratory: Positive for cough (chronic per report) and shortness of breath. Negative for chest tightness, wheezing and stridor.   Cardiovascular: Negative for chest pain and palpitations.  Gastrointestinal: Positive for nausea (resolved). Negative for vomiting, abdominal pain, diarrhea and constipation.  Genitourinary: Negative for dysuria and flank pain.  Musculoskeletal: Positive for back pain (chronic low back pain). Negative for neck pain and neck stiffness.  Skin: Negative for rash and wound.  Neurological: Positive for headaches. Negative for dizziness, seizures, facial asymmetry, weakness, light-headedness and numbness.  Psychiatric/Behavioral: Positive for confusion.  All other systems reviewed and are negative.     Allergies  Banana; Lyrica; and Pollen extract  Home Medications   Prior to Admission medications   Medication Sig Start Date End Date Taking? Authorizing Provider  budesonide (PULMICORT) 0.5 MG/2ML nebulizer solution Take 0.5 mg by nebulization 4 (four) times daily.   Yes Historical Provider, MD  cetirizine (ZYRTEC) 10 MG tablet Take 10 mg by mouth daily.   Yes Historical Provider, MD  diphenhydrAMINE (BENADRYL) 25 MG tablet Take 25 mg by mouth as needed for itching.    Yes Historical Provider, MD  DULoxetine (CYMBALTA) 30 MG capsule Take 30 mg by mouth 2 (two) times daily.    Yes Historical Provider, MD  fluticasone (FLONASE) 50 MCG/ACT nasal spray Place 1 spray into  both nostrils daily. 06/13/14 06/13/15 Yes Alex Ronnie Derby, DO  Fluticasone-Salmeterol (ADVAIR DISKUS) 250-50 MCG/DOSE AEPB INHALE 1 PUFF INTO THE LUNGS 2 TIMES DAILY 03/19/14  Yes Jones Bales, MD  gabapentin (NEURONTIN) 300 MG capsule Take 300 mg by mouth 3 (three) times daily.  06/25/14  Yes Historical Provider, MD  ipratropium-albuterol (DUONEB) 0.5-2.5 (3) MG/3ML SOLN Take 3 mLs by nebulization 2 (two) times daily. At 6am and 6pm   Yes Historical Provider, MD  lamoTRIgine (LAMICTAL) 100 MG tablet Take 100 mg by mouth daily.   Yes Historical Provider, MD  naproxen (NAPROSYN) 500 MG tablet Take 1 tablet (500 mg total) by mouth 2 (two) times daily with a meal. 07/10/14  Yes Carly J Rivet, MD  OLANZapine (ZYPREXA) 5 MG tablet Take 5 mg by mouth at bedtime.   Yes Historical Provider, MD  ondansetron (ZOFRAN ODT) 4 MG disintegrating tablet Take 1 tablet (4 mg total) by mouth  every 8 (eight) hours as needed for nausea or vomiting. 07/17/14  Yes Jerrye Noble, MD  oxyCODONE (OXY IR/ROXICODONE) 5 MG immediate release tablet Take 5 mg by mouth 4 (four) times daily.  07/29/14  Yes Historical Provider, MD  pantoprazole (PROTONIX) 40 MG tablet Take 1 tablet (40 mg total) by mouth 2 (two) times daily. 07/17/14 07/17/15 Yes Jerrye Noble, MD  PROAIR HFA 108 (90 BASE) MCG/ACT inhaler INHALE ONE PUFF BY MOUTH EVERY 2 TO 4 HOURS AS NEEDED FOR WHEEZING 05/21/14  Yes Rushil Sherrye Payor, MD  zolpidem (AMBIEN) 5 MG tablet Take 5 mg by mouth at bedtime as needed for sleep.   Yes Historical Provider, MD  budesonide (PULMICORT) 0.5 MG/2ML nebulizer solution Take 2 mLs (0.5 mg total) by nebulization 2 (two) times daily. DX j43.8 07/03/14   Kathee Delton, MD  cefdinir (OMNICEF) 300 MG capsule Take 2 capsules each am x 5 days 07/31/14   Kathee Delton, MD  ipratropium-albuterol (DUONEB) 0.5-2.5 (3) MG/3ML SOLN 4 times a day (breakfast, lunch, dinner, bedtime).Can take 2 additional treatments if needed on bad days. DX J43.8 07/03/14   Kathee Delton, MD  predniSONE (DELTASONE) 10 MG tablet Take 4 tabs daily x 2 days, 3 tabs daily x 2 days, 2 tabs daily x 2 days, 1 tab daily x 2 days 07/31/14   Kathee Delton, MD   BP 150/60 mmHg  Pulse 71  Temp(Src) 99.7 F (37.6 C) (Oral)  Resp 13  SpO2 93% Physical Exam  Constitutional: She is oriented to person, place, and time. She appears well-developed and well-nourished. No distress.  HENT:  Head: Normocephalic.  Mouth/Throat: No oropharyngeal exudate.  Eyes: Conjunctivae and EOM are normal. Pupils are equal, round, and reactive to light. No scleral icterus.  Neck: Normal range of motion. Neck supple.  Cardiovascular: Normal rate, normal heart sounds and intact distal pulses.   No murmur heard. Pulmonary/Chest: Effort normal and breath sounds normal. No stridor. No respiratory distress. She exhibits no tenderness.  Abdominal: Soft. Bowel sounds are normal. She exhibits no distension. There is no tenderness. There is no rebound.  Neurological: She is alert and oriented to person, place, and time. She has normal reflexes. No cranial nerve deficit. She exhibits normal muscle tone. Coordination normal.  Skin: Skin is warm. She is not diaphoretic. No erythema. No pallor.  Psychiatric: She has a normal mood and affect. Her speech is normal and behavior is normal. Thought content normal.  Nursing note and vitals reviewed.   ED Course  Procedures (including critical care time) Labs Review Labs Reviewed  CBC WITH DIFFERENTIAL/PLATELET - Abnormal; Notable for the following:    RBC 3.18 (*)    Hemoglobin 9.8 (*)    HCT 32.3 (*)    MCV 101.6 (*)    RDW 17.1 (*)    Platelets 463 (*)    All other components within normal limits  URINALYSIS, ROUTINE W REFLEX MICROSCOPIC - Abnormal; Notable for the following:    Ketones, ur 15 (*)    All other components within normal limits  COMPREHENSIVE METABOLIC PANEL - Abnormal; Notable for the following:    Chloride 100 (*)    BUN <5 (*)     Creatinine, Ser <0.30 (*)    Calcium 7.9 (*)    Total Protein 5.8 (*)    Albumin 3.2 (*)    All other components within normal limits  LIPASE, BLOOD - Abnormal; Notable for the following:  Lipase 15 (*)    All other components within normal limits  I-STAT ARTERIAL BLOOD GAS, ED - Abnormal; Notable for the following:    pH, Arterial 7.332 (*)    pCO2 arterial 62.1 (*)    pO2, Arterial 59.0 (*)    Bicarbonate 32.7 (*)    Acid-Base Excess 5.0 (*)    All other components within normal limits  BLOOD GAS, ARTERIAL  I-STAT TROPOININ, ED    Imaging Review Dg Chest 2 View  08/05/2014   CLINICAL DATA:  Shortness of breath and intermittent cough  EXAM: CHEST  2 VIEW  COMPARISON:  June 26, 2014 and November 13, 2013  FINDINGS: There is underlying emphysematous change with bullous disease throughout the upper bones. There is interstitial prominence in both lower lobes, felt to be due to redistribution of blood flow to a viable segments. There is no frank edema or consolidation. Heart size is within normal limits. Pulmonary vascularity is stable and reflects the underlying emphysematous change. No adenopathy. No bone lesions.  IMPRESSION: Underlying emphysematous change, most severe in the upper lobes. No edema or consolidation.   Electronically Signed   By: Lowella Grip III M.D.   On: 08/05/2014 10:10   Ct Head Wo Contrast  08/05/2014   CLINICAL DATA:  Recent confusion.  Questionable fall  EXAM: CT HEAD WITHOUT CONTRAST  TECHNIQUE: Contiguous axial images were obtained from the base of the skull through the vertex without intravenous contrast.  COMPARISON:  November 13, 2013  FINDINGS: There is age related volume loss. There is no intracranial mass, hemorrhage, extra-axial fluid collection, or midline shift. The gray-white compartments appear normal. No acute infarct apparent. The bony calvarium appears intact. The mastoid air cells are clear. There is a small benign osteoma in the right ethmoid  sinus region. There is rightward deviation of the nasal septum. There is opacification of a single ethmoid air cell on the right. There has been significant interval clearing of sinusitis compared to prior study.  IMPRESSION: Age related volume loss. No intracranial mass, hemorrhage, extra-axial fluid collection, or evidence suggesting focal infarct. Small focus of right ethmoid sinusitis.   Electronically Signed   By: Lowella Grip III M.D.   On: 08/05/2014 12:57   Ct Abdomen Pelvis W Contrast  08/05/2014   CLINICAL DATA:  Chronic generalized abdominal pain.  EXAM: CT ABDOMEN AND PELVIS WITH CONTRAST  TECHNIQUE: Multidetector CT imaging of the abdomen and pelvis was performed using the standard protocol following bolus administration of intravenous contrast.  CONTRAST:  1 OMNIPAQUE IOHEXOL 300 MG/ML  SOLN  COMPARISON:  CT scan of November 27, 2012.  FINDINGS: Mild grade 1 anterolisthesis of L4-5 is noted secondary to posterior facet joint hypertrophy. Emphysematous changes are noted in both lung bases.  No gallstones are noted. The liver, spleen and pancreas appear normal. Adrenal glands appear normal. No hydronephrosis or renal obstruction is noted. Small nonobstructive left renal calculus is noted. There is no evidence of bowel obstruction. Minimal free fluid is noted posteriorly in the pelvis. Atherosclerosis of abdominal aorta is noted without aneurysm formation. Urinary bladder is decompressed. Uterus appears normal. No significant adenopathy is noted.  IMPRESSION: Significant emphysematous disease is noted in both lung bases as noted on prior exam.  Small nonobstructive left renal calculus is noted.  No other significant abnormality seen in the abdomen or pelvis.   Electronically Signed   By: Marijo Conception, M.D.   On: 08/05/2014 13:07     EKG Interpretation  Date/Time:  Tuesday Aug 05 2014 08:53:43 EDT Ventricular Rate:  70 PR Interval:  118 QRS Duration: 69 QT Interval:  359 QTC  Calculation: 387 R Axis:   91 Text Interpretation:  Sinus rhythm Borderline short PR interval Probable  anterior infarct, age indeterminate No significant change since last  tracing Confirmed by YAO  MD, DAVID ([id number]) on 08/05/2014 9:21:27 AM      MDM   [name] is a 64 y.o. female with a past medical history significant for depression, bipolar disorder, COPD on chronic oxygen therapy, hypertension, pneumonias, and chronic low back pain who presents with confusion and fatigue. Given the patient's nondescript fatigue, there is concern for possible occult infection. The patient's physical exam was unremarkable aside from poor air movement in her lungs. The patient's abdomen was soft and non-tender on arrival, and her neurological exam was unremarkable. Given the patient's complaints of fatigue and mild confusion, the patient will have diagnostic laboratory and imaging studies to look for occult infection or electrolyte abnormality.  On reexamination, the patient is reporting that she is having right upper quadrant abdominal pain. The patient describes that the pain is constant, 3/10 in severity that increases to 8/10 with palpation. No associated symptoms. Pain medicine given and CT abdomen ordered.   The patient's of the abdomen does not show any significant abnormality in the abdomen or pelvis however, the did comment on emphysematous disease in the lungs. There is also a small nonobstructive left renal calculus.  The patient's chest x-ray showed the emphysematous disease but no evidence of edema or consolidation. The patient's initial troponin was negative.  Given the patient's continued shortness of breath, the patient was given steroids, antibiotics, and will be admitted for likely COPD exacerbation and for further abdominal pain control..   Final diagnoses:  Abdominal pain  COPD exacerbation       Antony Blackbird, MD 08/05/14 1606  Wandra Arthurs, MD 08/09/14 [phone]

## 2014-08-05 NOTE — ED Notes (Signed)
MD at bedside. 

## 2014-08-05 NOTE — H&P (Signed)
Date: 08/05/2014               Patient Name:  [name] MRN: [id number]  DOB: [birth year] Age / Sex: 64 y.o., female   PCP: Riccardo Dubin, MD         Medical Service: Internal Medicine Teaching Service         Attending Physician: Dr. Sid Falcon, MD    First Contact: Dr. Posey Pronto  Pager: [phone]   Second Contact: Dr. Ronnald Ramp  Pager: [phone]        After Hours (After 5p/  First Contact Pager: [phone]  weekends / holidays): Second Contact Pager: [phone]   Chief Complaint: Confusion, abdominal pain  History of Present Illness: [name] is a 64 year old female with COPD, iron deficiency anemia, chronic pain, allergic rhinitis, bipolar disorder who presented with confusion and abdominal pain..  The last thing she recalls this morning waking up in bed. When asked how she got to the hospital, she reported that the home health aide had been describing to her that she had observed the patient experienced a lot of "confused feeling." She is unaware if she ever lost consciousness or fell though denies any dizziness, numbness, tingling. She reports recently followed up with pulmonology on 07/31/14 at which point her oxygen was increased from 3-4 L; per the office note, she was switched to continuous oxygen therapy she reports ever since this change, she has been unable to move around as much since she feels the size of the oxygen tank inhibits her. She was also treated for an acute COPD exacerbation with prednisone taper [40 mg 2 days, 30 mg 2 days, 20 mg 2 days, 10 mg] along with cefdinir 300 mg 5 days.  Additionally, she reports abdominal pain localized to the right upper quadrant which she feels is consistent abdominal pain she described her last office visit 07/17/14 at which time she was prescribed Protonix 40 mg twice daily for GERD as well as Zofran tablets as needed for nausea/vomiting. She reports this pain has been ongoing for months and is associated with 4-5 episodes of loose  brown, nonbloody stool per day. Otherwise, she denies fever, chills, worsening shortness of breath though reports cough productive of brown sputum and wheezing. She continues to smoke 2 cigarettes a day though denies any recent alcohol or illicit drug use and lives by herself.   In the emergency department, abdominal CT was notable for small nonobstructive left renal calculus and head CT reassuring. ABG was notable for pH 7.332, CO2 62.1, O2 59.0, bicarbonate 32.7. She was given Levaquin 750 mg IV, morphine 4 mg, and Solu-Medrol 125 mg IV.    Meds: Current Facility-Administered Medications  Medication Dose Route Frequency Provider Last Rate Last Dose  . guaiFENesin (MUCINEX) 12 hr tablet 600 mg  600 mg Oral BID PRN Cresenciano Genre, MD      . iohexol (OMNIPAQUE) 300 MG/ML solution 25 mL  25 mL Oral Once PRN Medication Radiologist, MD       Current Outpatient Prescriptions  Medication Sig Dispense Refill  . budesonide (PULMICORT) 0.5 MG/2ML nebulizer solution Take 0.5 mg by nebulization 4 (four) times daily.    . cetirizine (ZYRTEC) 10 MG tablet Take 10 mg by mouth daily.    . diphenhydrAMINE (BENADRYL) 25 MG tablet Take 25 mg by mouth as needed for itching.     . DULoxetine (CYMBALTA) 30 MG capsule Take 30 mg by mouth 2 (two) times daily.     Marland Kitchen  fluticasone (FLONASE) 50 MCG/ACT nasal spray Place 1 spray into both nostrils daily. 16 g 1  . Fluticasone-Salmeterol (ADVAIR DISKUS) 250-50 MCG/DOSE AEPB INHALE 1 PUFF INTO THE LUNGS 2 TIMES DAILY 5 each 6  . gabapentin (NEURONTIN) 300 MG capsule Take 300 mg by mouth 3 (three) times daily.     Marland Kitchen ipratropium-albuterol (DUONEB) 0.5-2.5 (3) MG/3ML SOLN Take 3 mLs by nebulization 2 (two) times daily. At 6am and 6pm    . lamoTRIgine (LAMICTAL) 100 MG tablet Take 100 mg by mouth daily.    . naproxen (NAPROSYN) 500 MG tablet Take 1 tablet (500 mg total) by mouth 2 (two) times daily with a meal. 60 tablet 0  . OLANZapine (ZYPREXA) 5 MG tablet Take 5 mg by mouth  at bedtime.    . ondansetron (ZOFRAN ODT) 4 MG disintegrating tablet Take 1 tablet (4 mg total) by mouth every 8 (eight) hours as needed for nausea or vomiting. 40 tablet 0  . oxyCODONE (OXY IR/ROXICODONE) 5 MG immediate release tablet Take 5 mg by mouth 4 (four) times daily.     . pantoprazole (PROTONIX) 40 MG tablet Take 1 tablet (40 mg total) by mouth 2 (two) times daily. 60 tablet 1  . PROAIR HFA 108 (90 BASE) MCG/ACT inhaler INHALE ONE PUFF BY MOUTH EVERY 2 TO 4 HOURS AS NEEDED FOR WHEEZING 8.5 g 12  . zolpidem (AMBIEN) 5 MG tablet Take 5 mg by mouth at bedtime as needed for sleep.    . budesonide (PULMICORT) 0.5 MG/2ML nebulizer solution Take 2 mLs (0.5 mg total) by nebulization 2 (two) times daily. DX j43.8 120 mL 3  . cefdinir (OMNICEF) 300 MG capsule Take 2 capsules each am x 5 days 10 capsule 0  . ipratropium-albuterol (DUONEB) 0.5-2.5 (3) MG/3ML SOLN 4 times a day (breakfast, lunch, dinner, bedtime).Can take 2 additional treatments if needed on bad days. DX J43.8 360 mL 3  . predniSONE (DELTASONE) 10 MG tablet Take 4 tabs daily x 2 days, 3 tabs daily x 2 days, 2 tabs daily x 2 days, 1 tab daily x 2 days 20 tablet 0    Allergies: Allergies as of 08/05/2014 - Review Complete 08/05/2014  Allergen Reaction Noted  . Banana Shortness Of Breath and Nausea And Vomiting 01/10/2012  . Lyrica [pregabalin]  06/26/2014  . Pollen extract Other (See Comments) 11/20/2012   Past Medical History  Diagnosis Date  . Depression   . Suicidal ideation 2007     attempted overdose 2012  Dr. Tivis Ringer report  . Bipolar disorder   . Substance abuse      narcotics, alcohol, tobacco  . Chronic pain syndrome     follows at [facility]  . DEFICIENCY, VITAMIN D NOS 01/03/2007  . Chronic lower back pain   . Anxiety   . OSTEOPOROSIS 06/17/2009    DEXA 05/2009 : L femur -2.9; R femur -2.5. Alendronate on med list but not taking. Needs addressed ASAP as h/o fractures.    . Elevated liver function  tests   . On home oxygen therapy     "3L; 24/7" (03/20/2014)  . Shortness of breath     "all the time" (02/27/2013)  . 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 Sciatic pain   . Anemia   . Pneumonia X 1?  . Hyperthyroidism     "borderline"  . History of blood transfusion     "getting my 1st later tonight" (03/20/2014)  .  Arthritis     "legs, back; hips, primarily left hip" (03/20/2014)   Past Surgical History  Procedure Laterality Date  . Orif ankle fracture Left 10/2010  . Cesarean section  1974; 1979  . Tubal ligation  1979  . Augmentation mammaplasty  ~ 2007  . Fracture surgery    . Ankle debridement Left 10/2010   Family History  Problem Relation Age of Onset  . Stroke Neg Hx   . Cancer Neg Hx   . Myelodysplastic syndrome Father     Died from myelofibrosis though diagnosed post-mortem   History   Social History  . Marital Status: Divorced    Spouse Name: N/A  . Number of Children: 2  . Years of Education: N/A   Occupational History  . retired    Social History Main Topics  . Smoking status: Current Every Day Smoker -- 0.20 packs/day for 45 years    Types: Cigarettes  . Smokeless tobacco: Never Used     Comment: 03/20/2014 "smoking 2 cigarettes/day; that's down from 2 ppd"  . Alcohol Use: 0.0 oz/week    0 Standard drinks or equivalent per week     Comment: 03/20/2014 "nothing in 7 years; never had a problem w/it"  . Drug Use: No  . Sexual Activity: No   Other Topics Concern  . 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 rate 12, SpO2 94  %.   General: Elderly-appearing cachectic Caucasian female, resting in bed, NAD HEENT: PERRL, EOMI, no scleral icterus, oropharynx clear Cardiac: RRR, no rubs, murmurs or gallops Pulm: clear to auscultation bilaterally in the posterior lung fields though poor air movement Abd: soft, tender in the right upper quadrant, mildly distended, BS present Ext: warm and well perfused, no pedal or tibial edema Neuro: Oriented to name, place, year, president, CN II-XII intact, 45 upper and lower extremity strength, 2+ grip strength, difficulty performing finger to nose     Lab results: Basic Metabolic Panel:  Recent Labs  08/05/14 0933  NA 141  K 3.5  CL 100*  CO2 30  GLUCOSE 87  BUN <5*  CREATININE <0.30*  CALCIUM 7.9*   Liver Function Tests:  Recent Labs  08/05/14 0933  AST 25  ALT 18  ALKPHOS 76  BILITOT 0.5  PROT 5.8*  ALBUMIN 3.2*    Recent Labs  08/05/14 0933  LIPASE 15*   CBC:  Recent Labs  08/05/14 0933  WBC 4.7  NEUTROABS 2.9  HGB 9.8*  HCT 32.3*  MCV 101.6*  PLT 463*   Urinalysis:  Recent Labs  08/05/14 1104  COLORURINE YELLOW  LABSPEC 1.007  PHURINE 6.0  GLUCOSEU NEGATIVE  HGBUR NEGATIVE  BILIRUBINUR NEGATIVE  KETONESUR 15*  PROTEINUR NEGATIVE  UROBILINOGEN 0.2  NITRITE NEGATIVE  LEUKOCYTESUR NEGATIVE    Imaging results:  Dg Chest 2 View  08/05/2014   CLINICAL DATA:  Shortness of breath and intermittent cough  EXAM: CHEST  2 VIEW  COMPARISON:  June 26, 2014 and November 13, 2013  FINDINGS:  There is underlying emphysematous change with bullous disease throughout the upper bones. There is interstitial prominence in both lower lobes, felt to be due to redistribution of blood flow to a viable segments. There is no frank edema or consolidation. Heart size is within normal limits. Pulmonary vascularity is stable and reflects the underlying emphysematous change. No adenopathy. No bone lesions.  IMPRESSION: Underlying emphysematous change, most  severe in the upper lobes. No edema or consolidation.   Electronically Signed   By: Lowella Grip III M.D.   On: 08/05/2014 10:10   Ct Head Wo Contrast  08/05/2014   CLINICAL DATA:  Recent confusion.  Questionable fall  EXAM: CT HEAD WITHOUT CONTRAST  TECHNIQUE: Contiguous axial images were obtained from the base of the skull through the vertex without intravenous contrast.  COMPARISON:  November 13, 2013  FINDINGS: There is age related volume loss. There is no intracranial mass, hemorrhage, extra-axial fluid collection, or midline shift. The gray-white compartments appear normal. No acute infarct apparent. The bony calvarium appears intact. The mastoid air cells are clear. There is a small benign osteoma in the right ethmoid sinus region. There is rightward deviation of the nasal septum. There is opacification of a single ethmoid air cell on the right. There has been significant interval clearing of sinusitis compared to prior study.  IMPRESSION: Age related volume loss. No intracranial mass, hemorrhage, extra-axial fluid collection, or evidence suggesting focal infarct. Small focus of right ethmoid sinusitis.   Electronically Signed   By: Lowella Grip III M.D.   On: 08/05/2014 12:57   Ct Abdomen Pelvis W Contrast  08/05/2014   CLINICAL DATA:  Chronic generalized abdominal pain.  EXAM: CT ABDOMEN AND PELVIS WITH CONTRAST  TECHNIQUE: Multidetector CT imaging of the abdomen and pelvis was performed using the standard protocol following bolus administration of intravenous contrast.  CONTRAST:  1 OMNIPAQUE IOHEXOL 300 MG/ML  SOLN  COMPARISON:  CT scan of November 27, 2012.  FINDINGS: Mild grade 1 anterolisthesis of L4-5 is noted secondary to posterior facet joint hypertrophy. Emphysematous changes are noted in both lung bases.  No gallstones are noted. The liver, spleen and pancreas appear normal. Adrenal glands appear normal. No hydronephrosis or renal obstruction is noted. Small nonobstructive left  renal calculus is noted. There is no evidence of bowel obstruction. Minimal free fluid is noted posteriorly in the pelvis. Atherosclerosis of abdominal aorta is noted without aneurysm formation. Urinary bladder is decompressed. Uterus appears normal. No significant adenopathy is noted.  IMPRESSION: Significant emphysematous disease is noted in both lung bases as noted on prior exam.  Small nonobstructive left renal calculus is noted.  No other significant abnormality seen in the abdomen or pelvis.   Electronically Signed   By: Marijo Conception, M.D.   On: 08/05/2014 13:07    Other results: EKG: Reviewed and compared with 06/26/14 Normal rate, axis Baseline wander T-wave inversions in V5, stable from prior T-wave flattening in V6   Assessment & Plan by Problem:  [name] is a 64 year old female with COPD, iron deficiency anemia, chronic pain, allergic rhinitis, bipolar disorder hospitalized for altered mental status and right upper quadrant abdominal pain found to have mild hypocalcemia.  Altered mental status: Her recent change oxygen therapy may have affected her respiratory drive to the point where she is retaining more carbon dioxide than before which could certainly account for altered mental status given ABG findings that appear consistent with acute hypoxic hypercapnic respiratory failure as compared to last ABG with  pH 7.512, CO2 40.1, O2 85, bicarbonate 30.6. Other possible causes include oversedation from her pain medication. Infectious etiologies appear less likely given reassuring chest x-ray findings and UA results. UDS results are pending though does not appear she may have ingested toxins. Electrolytes are notable for mild hypocalcemia but otherwise reassuring for no overt metabolic causes. Head CT and neurologic exam otherwise reassuring as well. -Check serum ethanol -Check UDS -Continue home oxygen at 3 L -Continue neuro checks and consider BiPAP should she decompensate  Right  upper quadrant abdominal pain: Possible etiologies include acute hepatitis, acute cholecystitis, biliary colic peptic ulcer disease though imaging findings and lab work to not appear markedly different from prior values. Lipase is additionally reassuring along with LFTs. Certainly, her frequent diarrhea in the context of antibiotic use since her April hospitalization and starting a PPI suggests C. difficile colitis, and her pain could represent an acute on chronic process though no findings noted on CT. She has been on naproxen since April which may contribute to gastritis, especially given some relief with PPI therapy. She is on chronic pain medications though no dose adjustments have been made and would further cause constipation which are inconsistent with her reported symptoms. -Check C. difficile PCR -Stop naproxen for now -Continue Protonix 40 mg twice daily  Iron deficiency anemia: Hemoglobin 9.8 on admission, baseline 9-11 since January at which time she was hospitalized for symptomatic anemia with hemoglobin 5.2 and discharged on iron supplementation which she has since discontinued given its effects on her GI tract. -Recheck CBC tomorrow  COPD: No signs of wheezing noted on exam. Per chart review, it appears she never followed up to have any of her outpatient PFTs performed, dating as far back as 2012. Home medications include Advair 1 puff twice daily, DuoNeb's 4 times daily, albuterol 1 puff 2-4 hours as needed for wheezing. -Continue Dulera 2 puffs twice daily instead of Advair along with the remainder of her home medications  Chronic pain: Per [place] DEA database, she recently filled oxycodone 5 mg 30 tablets on 07/29/14. She is also on Neurontin 300 mg 3 times daily. -Hold pending resolution of mental status  Hypocalcemia: Corrected calcium 8.5, stable from 2 weeks ago. Likely from malnutrition. -Consider calcium supplementation  Bipolar disorder: Her medications include Cymbalta 30 mg  twice daily, Lamictal 100 mg daily, Zyprexa 5 mg daily at bedtime. -Continue her home medications  Allergic rhinitis: Her medications include Zyrtec 10 mg daily, Flonase 1 spray in both nostrils daily, Benadryl 25 mg as needed for itching. -Continue home medications  #FEN:  -Diet: Heart healthy  #DVT prophylaxis: Lovenox  #CODE STATUS: DNR/DNI -Defer to sister [name] [phone] if patients lacks decision-making capacity -Confirmed with patient on admission    Dispo: Disposition is deferred at this time, awaiting improvement of current medical problems.  The patient does have a current PCP (Rushil Sherrye Payor, MD) and does need an [facility] hospital follow-up appointment after discharge.  The patient does have transportation limitations that hinder transportation to clinic appointments.  Signed: Riccardo Dubin, MD 08/05/2014, 3:01 PM

## 2014-08-05 NOTE — ED Notes (Signed)
Sister of pt, [name] called to make sure nurse had her number. (Cell) - [phone]

## 2014-08-05 NOTE — ED Notes (Signed)
Per EMS- pt reports chronic back pain and abdominal pain for years. Pt also reports intermittent hand shaking. Pt states that she was confused Saturday as well. Pt alert and oriented x4 at this time. Pt states that her home health aid who she reports having because "i just cant take care of myself anymore" wanted her to be seen because "I just havent been feeling well for a couple of days and she wanted to know why".

## 2014-08-05 NOTE — ED Notes (Signed)
Admitting MD at bedside.

## 2014-08-06 DIAGNOSIS — R4182 Altered mental status, unspecified: Secondary | ICD-10-CM

## 2014-08-06 DIAGNOSIS — D509 Iron deficiency anemia, unspecified: Secondary | ICD-10-CM | POA: Diagnosis not present

## 2014-08-06 DIAGNOSIS — R1011 Right upper quadrant pain: Secondary | ICD-10-CM | POA: Diagnosis not present

## 2014-08-06 DIAGNOSIS — F3164 Bipolar disorder, current episode mixed, severe, with psychotic features: Secondary | ICD-10-CM

## 2014-08-06 DIAGNOSIS — K529 Noninfective gastroenteritis and colitis, unspecified: Secondary | ICD-10-CM | POA: Diagnosis not present

## 2014-08-06 DIAGNOSIS — F17200 Nicotine dependence, unspecified, uncomplicated: Secondary | ICD-10-CM

## 2014-08-06 DIAGNOSIS — J449 Chronic obstructive pulmonary disease, unspecified: Secondary | ICD-10-CM | POA: Diagnosis not present

## 2014-08-06 DIAGNOSIS — Z66 Do not resuscitate: Secondary | ICD-10-CM

## 2014-08-06 DIAGNOSIS — Z79899 Other long term (current) drug therapy: Secondary | ICD-10-CM

## 2014-08-06 DIAGNOSIS — T50905S Adverse effect of unspecified drugs, medicaments and biological substances, sequela: Secondary | ICD-10-CM | POA: Diagnosis not present

## 2014-08-06 DIAGNOSIS — D539 Nutritional anemia, unspecified: Secondary | ICD-10-CM | POA: Diagnosis not present

## 2014-08-06 DIAGNOSIS — G8929 Other chronic pain: Secondary | ICD-10-CM

## 2014-08-06 DIAGNOSIS — Z9981 Dependence on supplemental oxygen: Secondary | ICD-10-CM

## 2014-08-06 DIAGNOSIS — Z7951 Long term (current) use of inhaled steroids: Secondary | ICD-10-CM

## 2014-08-06 DIAGNOSIS — J309 Allergic rhinitis, unspecified: Secondary | ICD-10-CM

## 2014-08-06 LAB — CBC
HCT: 28.3 % — ABNORMAL LOW (ref 36.0–46.0)
HEMOGLOBIN: 8.8 g/dL — AB (ref 12.0–15.0)
MCH: 30.9 pg (ref 26.0–34.0)
MCHC: 31.1 g/dL (ref 30.0–36.0)
MCV: 99.3 fL (ref 78.0–100.0)
PLATELETS: 456 10*3/uL — AB (ref 150–400)
RBC: 2.85 MIL/uL — ABNORMAL LOW (ref 3.87–5.11)
RDW: 16.6 % — AB (ref 11.5–15.5)
WBC: 4.5 10*3/uL (ref 4.0–10.5)

## 2014-08-06 LAB — BLOOD GAS, ARTERIAL
ACID-BASE EXCESS: 11.5 mmol/L — AB (ref 0.0–2.0)
Bicarbonate: 36.6 mEq/L — ABNORMAL HIGH (ref 20.0–24.0)
Drawn by: 312971
O2 Content: 4 L/min
O2 Saturation: 95.9 %
PO2 ART: 83.8 mmHg (ref 80.0–100.0)
Patient temperature: 98.6
TCO2: 38.4 mmol/L (ref 0–100)
pCO2 arterial: 59 mmHg (ref 35.0–45.0)
pH, Arterial: 7.409 (ref 7.350–7.450)

## 2014-08-06 LAB — BASIC METABOLIC PANEL
Anion gap: 10 (ref 5–15)
BUN: 6 mg/dL (ref 6–20)
CHLORIDE: 93 mmol/L — AB (ref 101–111)
CO2: 35 mmol/L — ABNORMAL HIGH (ref 22–32)
CREATININE: 0.31 mg/dL — AB (ref 0.44–1.00)
Calcium: 7.8 mg/dL — ABNORMAL LOW (ref 8.9–10.3)
GFR calc Af Amer: >60 mL/min (ref >60)
GFR calc non Af Amer: >60 mL/min (ref >60)
Glucose, Bld: 70 mg/dL (ref 65–99)
Potassium: 3.5 mmol/L (ref 3.5–5.1)
SODIUM: 138 mmol/L (ref 135–145)

## 2014-08-06 LAB — RAPID URINE DRUG SCREEN, HOSP PERFORMED
Amphetamines: NOT DETECTED
Barbiturates: NOT DETECTED
Benzodiazepines: NOT DETECTED
Cocaine: NOT DETECTED
OPIATES: NOT DETECTED
TETRAHYDROCANNABINOL: NOT DETECTED

## 2014-08-06 LAB — MAGNESIUM: Magnesium: 1.7 mg/dL (ref 1.7–2.4)

## 2014-08-06 NOTE — Progress Notes (Signed)
Occupational Therapy Evaluation Patient Details Name: [name] MRN: [id number] DOB: 05/29/1950 Today's Date: 08/06/2014    History of Present Illness [name] is a 64 year old female with COPD, iron deficiency anemia, chronic pain, allergic rhinitis, bipolar disorder who presented with confusion and abdominal pain..   Clinical Impression   Pt admitted with the above diagnoses and presents with below problem list. Pt will benefit from continued acute OT to address the below listed deficits and maximize independence with BADLs prior to d/c to venue below. PTA pt was mod I to independent with most ADLs. She reports needing assistance to complete tub shower transfer. Pt is currently min guard for most ADLs and transfers and mod A for tub transfer. Pt c/o dizziness/lightheadedness in standing/short distance ambulation. OT to continue to follow acutely.     Follow Up Recommendations  Supervision - Intermittent;Home health OT    Equipment Recommendations  None recommended by OT    Recommendations for Other Services       Precautions / Restrictions Precautions Precautions: Fall      Mobility Bed Mobility               General bed mobility comments: in recliner at start and end of session  Transfers Overall transfer level: Needs assistance Equipment used: Rolling walker (2 wheeled) Transfers: Sit to/from Stand Sit to Stand: Supervision         General transfer comment: no physical assist    Balance Overall balance assessment: Needs assistance Sitting-balance support: Feet supported Sitting balance-Leahy Scale: Good     Standing balance support: No upper extremity supported;During functional activity Standing balance-Leahy Scale: Fair Standing balance comment: stood to complete grooming tasks with occasional balance checks                            ADL Overall ADL's : Needs assistance/impaired Eating/Feeding: Set up;Sitting   Grooming: Min  guard;Standing   Upper Body Bathing: Set up;Sitting   Lower Body Bathing: Min guard;Sit to/from stand   Upper Body Dressing : Set up;Sitting   Lower Body Dressing: Min guard;Sit to/from stand   Toilet Transfer: Min guard;Ambulation;BSC;RW   Toileting- Water quality scientist and Hygiene: Min guard;Sit to/from stand   Tub/ Shower Transfer: Tub transfer;Ambulation;Shower seat;Rolling walker;Moderate assistance Tub/Shower Transfer Details (indicate cue type and reason): baseline min A for tub shower transfer Functional mobility during ADLs: Min guard;Rolling walker (10 steps in room; c/o lightheadedness with ambulation) General ADL Comments: Pt completed grooming tasks standing in front of recliner as detailed above. Pt then ambulated in-room 10 steps. Pt noted to pause occasionally in standing/functional mobility as nausea/lightheadedness increases.      Vision     Perception     Praxis      Pertinent Vitals/Pain Pain Assessment: No/denies pain     Hand Dominance Right   Extremity/Trunk Assessment Upper Extremity Assessment Upper Extremity Assessment: Generalized weakness   Lower Extremity Assessment Lower Extremity Assessment: Defer to PT evaluation       Communication Communication Communication: No difficulties   Cognition Arousal/Alertness: Awake/alert Behavior During Therapy: WFL for tasks assessed/performed Overall Cognitive Status: Within Functional Limits for tasks assessed                     General Comments       Exercises       Shoulder Instructions      Home Living Family/patient expects to be discharged to:: Private residence  Living Arrangements: Alone (lives [place]) Available Help at Discharge: Family;Personal care attendant;Available PRN/intermittently (Aide 1 hr/day 7 days/week) Type of Home: Apartment Home Access: Elevator     Home Layout: One level     Bathroom Shower/Tub: Tub/shower unit         Home Equipment:  Environmental consultant - 4 wheels;Shower seat          Prior Functioning/Environment Level of Independence: Needs assistance  Gait / Transfers Assistance Needed: uses rolling walker outside of apartment but not in apartment ADL's / Homemaking Assistance Needed: Aide assists with tub shower transfer. Pt reports she was independent wiith bathing/dressing/toilet transfers. Pt does not drive. Pt reports meal prep becoming more difficult.        OT Diagnosis: Generalized weakness   OT Problem List: Impaired balance (sitting and/or standing);Decreased knowledge of use of DME or AE;Decreased knowledge of precautions;Cardiopulmonary status limiting activity;Decreased activity tolerance   OT Treatment/Interventions: Self-care/ADL training;Energy conservation;DME and/or AE instruction;Therapeutic activities;Patient/family education;Balance training    OT Goals(Current goals can be found in the care plan section) Acute Rehab OT Goals Patient Stated Goal: transition to ALF OT Goal Formulation: With patient Time For Goal Achievement: 08/13/14 Potential to Achieve Goals: Good ADL Goals Pt Will Perform Lower Body Dressing: with modified independence;with adaptive equipment;sit to/from stand Pt Will Transfer to Toilet: with modified independence;ambulating (3n1 over toilet) Pt Will Perform Toileting - Clothing Manipulation and hygiene: with modified independence;sit to/from stand Pt Will Perform Tub/Shower Transfer: Tub transfer;with min assist;ambulating;shower seat;rolling walker Additional ADL Goal #1: Pt will verbalize 3 energy conservation strategies for ADLs with handout provided.  OT Frequency: Min 2X/week   Barriers to D/C:            Co-evaluation              End of Session Equipment Utilized During Treatment: Gait belt;Rolling walker;Oxygen  Activity Tolerance: Other (comment) (dizziness/lightheadedness in standing) Patient left: in chair;with call bell/phone within reach   Time:  1257-1328 OT Time Calculation (min): 31 min Charges:  OT General Charges $OT Visit: 1 Procedure OT Evaluation $Initial OT Evaluation Tier I: 1 Procedure OT Treatments $Self Care/Home Management : 8-22 mins G-Codes: OT G-codes **NOT FOR INPATIENT CLASS** Functional Assessment Tool Used: clinical judgement Functional Limitation: Self care Self Care Current Status (X4801): At least 20 percent but less than 40 percent impaired, limited or restricted Self Care Goal Status (K5537): At least 1 percent but less than 20 percent impaired, limited or restricted  Hortencia Pilar 08/06/2014, 3:00 PM

## 2014-08-06 NOTE — Progress Notes (Signed)
Pt refused ABG stating this would be her third one in two days with no changes and didn't feel is was necessary. Will let RN know. PT is alert and oriented and very pleasant.

## 2014-08-06 NOTE — Progress Notes (Signed)
  Date: 08/06/2014  Patient name: [name]  Medical record number: [id number]  Date of birth: [birth year]   I have seen and evaluated [name] and discussed their care with the Residency Team. [name] is a 64yo woman with COPD on home O2, iron deficiency anemia, chornic pain, bipolar who presented with confusion and abdominal pain with diarrhea.  As per her confusion, she cannot remember exactly how she go tot he hospital.  Most recent change to her regimen is an increase in O2 by her pulmonologist.  Since that time she has had some limitations to her mobility.  She was seen on May 19 and was also given a course of steroids.   As per her abdominal pain, it is RUQ in nature, sharp, ongoing for months and associated with diarrhea, about 5 times per day.  She was evaluated in the clinic on 5/5 for the same pain and given protonix and zofran which have ehlped.  She has had multiple course of antibiotics in the past few months and also had a dose of levaquin in the ED. She continues to smoke.  She has chronic brown sputum with cough and wheezing.  On my exam, her respiratory status was at baseline based on previous interactions with [name].  She was alert and oriented to questioning.  She had abdominal pain and diarrhea, but she notes that these are chronic and could not remember if they had changed recently.  She has had no BM since being admitted.  ON exam, she has no wheezing.  She has pain with palpation of the RUQ.   In the ED, she had an ABG which showed pH of 7.33, CO2 of 62 which is more than a previous ABG.  She was given solumedrol, levaqin and morphine.    Assessment and Plan: I have seen and evaluated the patient as outlined above. I agree with the formulated Assessment and Plan as detailed in the residents' admission note, with the following changes:   1. AMS - This is her main issues, the others noted below appear to be chornic - She is retaining more CO2 now based on ABG,  this may be related to her increase O2 use - Will decrease O2 to [facility] - Neuro checks - Repeat ABG if possible - Consider bipap - transaminases are normal, making liver disease less likely, she has no focal neurological signs - Agree with UDS and ethanol level - Consider steroid psychosis and acute worsening of her mental illness (bipolar)  2. RUQ abdominal pain - THis appears chronic based on discussion with patient, improved with protonix and zofran - She was recenlty prescribed naproxen, which she reports taking once per day, however, with her confusion, this could be more - Check hemocult - Consider gastritis, peptic ulcer disease - Check Cdiff given diarrhea and recent abx - Continue protonix - Hold naproxen  Other issues per resident note.  PT/OT ordered.    Sid Falcon, MD 5/25/20164:04 PM

## 2014-08-06 NOTE — Evaluation (Signed)
Physical Therapy Evaluation Patient Details Name: [name] MRN: [id number] DOB: Oct 15, 1950 Today's Date: 08/06/2014   History of Present Illness  [name] is a 64 year old female with COPD, iron deficiency anemia, chronic pain, allergic rhinitis, bipolar disorder who presented with confusion and abdominal pain..  Clinical Impression  Pt functioning near baseline however has poor activity tolerance and endurance limiting ability to complete IADLs like cooking and cleaning. Pt reports "I"m trying to transition to ALF." Pt currently has an HHA 1hr/day x 7/days and receives a free lunch M-F. Recommend HHPT to address energy conservation and safety with in home mobility and function. Acute PT to con't to follow to progress indep with mobility as able.    Follow Up Recommendations Home health PT;Supervision - Intermittent    Equipment Recommendations  None recommended by PT    Recommendations for Other Services       Precautions / Restrictions Precautions Precautions: Fall      Mobility  Bed Mobility Overal bed mobility:  (pt sitting EOB upon PT arrival)                Transfers Overall transfer level: Needs assistance Equipment used: Rolling walker (2 wheeled) Transfers: Sit to/from Stand Sit to Stand: Supervision         General transfer comment: pt with good use of hands to push up from bed  Ambulation/Gait Ambulation/Gait assistance: Supervision Ambulation Distance (Feet): 20 Feet Assistive device: Rolling walker (2 wheeled) Gait Pattern/deviations: Step-through pattern;Decreased stride length Gait velocity: decreased Gait velocity interpretation: Below normal speed for age/gender General Gait Details: limited by SOB and decreased activity tolerance  Stairs            Wheelchair Mobility    Modified Rankin (Stroke Patients Only)       Balance Overall balance assessment: Needs assistance         Standing balance support: No upper  extremity supported Standing balance-Leahy Scale: Fair Standing balance comment: pt able to stand without UE support however requires RW for ambulation                             Pertinent Vitals/Pain Pain Assessment: No/denies pain    Home Living Family/patient expects to be discharged to:: Private residence Living Arrangements: Alone (lives in [place]) Available Help at Discharge: Family;Personal care attendant;Available PRN/intermittently (AIDE 1hr/day 7x/week) Type of Home: Apartment Home Access: Elevator     Home Layout: One level Home Equipment: Walker - 4 wheels;Shower seat      Prior Function Level of Independence: Needs assistance   Gait / Transfers Assistance Needed: uses rolling walker outside of apartment but not in apartment  ADL's / Homemaking Assistance Needed: pt doesn't drive, pt gets a ride to grocery store and can shop on her own. AIDE assists with cleaning and bathing. pt reports increasing difficulty with meal prep        Hand Dominance        Extremity/Trunk Assessment   Upper Extremity Assessment: Generalized weakness           Lower Extremity Assessment: Generalized weakness      Cervical / Trunk Assessment: Normal  Communication   Communication: No difficulties  Cognition Arousal/Alertness: Awake/alert Behavior During Therapy: WFL for tasks assessed/performed Overall Cognitive Status: Within Functional Limits for tasks assessed                      General Comments  General comments (skin integrity, edema, etc.): pt requesting breathing treatment due to SOB. Respiratory called and Claiborne Billings came to room to complete ABG and breathing treatment    Exercises        Assessment/Plan    PT Assessment Patient needs continued PT services  PT Diagnosis Difficulty walking   PT Problem List Decreased strength;Decreased balance;Decreased mobility;Decreased activity tolerance  PT Treatment Interventions DME  instruction;Gait training;Functional mobility training;Therapeutic activities;Therapeutic exercise   PT Goals (Current goals can be found in the Care Plan section) Acute Rehab PT Goals Patient Stated Goal: transition to ALF PT Goal Formulation: With patient Time For Goal Achievement: 08/13/14 Potential to Achieve Goals: Good    Frequency Min 3X/week   Barriers to discharge        Co-evaluation               End of Session Equipment Utilized During Treatment: Oxygen (4LO2 via [place]) Activity Tolerance: Patient limited by fatigue Patient left: in chair;with call bell/phone within reach (respiratory therapist) Nurse Communication: Mobility status    Functional Assessment Tool Used: clinical judgement Functional Limitation: Mobility: Walking and moving around Mobility: Walking and Moving Around Current Status (K8127): At least 20 percent but less than 40 percent impaired, limited or restricted Mobility: Walking and Moving Around Goal Status [phone]): At least 1 percent but less than 20 percent impaired, limited or restricted    Time: 0804-0824 PT Time Calculation (min) (ACUTE ONLY): 20 min   Charges:   PT Evaluation $Initial PT Evaluation Tier I: 1 Procedure     PT G Codes:   PT G-Codes **NOT FOR INPATIENT CLASS** Functional Assessment Tool Used: clinical judgement Functional Limitation: Mobility: Walking and moving around Mobility: Walking and Moving Around Current Status (F7494): At least 20 percent but less than 40 percent impaired, limited or restricted Mobility: Walking and Moving Around Goal Status [phone]): At least 1 percent but less than 20 percent impaired, limited or restricted    Kingsley Callander 08/06/2014, 8:44 AM   Kittie Plater, PT, DPT Pager #: [phone] Office #: [phone]

## 2014-08-06 NOTE — Progress Notes (Signed)
Subjective: This morning, she is sitting in her chair at bedside. She reports that she was able to tolerate her breakfast fairly well though continues to not feel well, describing it as "cold." She also reports still feeling confused though knows where she is, the year, and was able to name the bagel as the only leftover food in her plate. She reports her abdominal pain is still there albeit somewhat improved. Since she was started on naproxen, she reports taking 1 tablet daily with meals. We explained that can sometimes contribute to gastric irritation to which she expressed surprise.  Objective: Vital signs in last 24 hours: Filed Vitals:   08/05/14 1740 08/05/14 2009 08/05/14 2102 08/06/14 0623  BP: 152/83  130/53 143/61  Pulse: 70  74 72  Temp: 99.1 F (37.3 C)  98.2 F (36.8 C) 99 F (37.2 C)  TempSrc: Oral  Oral   Resp: 16  16 14   Weight:    90 lb 2.7 oz (40.9 kg)  SpO2: 92% 90% 91% 98%   Weight change:   Intake/Output Summary (Last 24 hours) at 08/06/14 1049 Last data filed at 08/05/14 1710  Gross per 24 hour  Intake      0 ml  Output    800 ml  Net   -800 ml   General: Elderly-appearing cachectic Caucasian female, sitting in chair, 3.5 L O2 by nasal cannula HEENT: PERRL, EOMI, no scleral icterus, oropharynx clear Cardiac: RRR, no rubs, murmurs or gallops Pulm: clear to auscultation bilaterally in the posterior lung fields though poor air movement Abd: soft, tender in the right upper quadrant, mildly distended, BS present Ext: warm and well perfused, no pedal or tibial edema Neuro: Moving all extremities spontaneously, responds to questions appropriately  Lab Results: Basic Metabolic Panel:  Recent Labs Lab 08/05/14 0933 08/06/14 0524  NA 141 138  K 3.5 3.5  CL 100* 93*  CO2 30 35*  GLUCOSE 87 70  BUN <5* 6  CREATININE <0.30* 0.31*  CALCIUM 7.9* 7.8*  MG  --  1.7   Liver Function Tests:  Recent Labs Lab 08/05/14 0933  AST 25  ALT 18  ALKPHOS 76    BILITOT 0.5  PROT 5.8*  ALBUMIN 3.2*    Recent Labs Lab 08/05/14 0933  LIPASE 15*   CBC:  Recent Labs Lab 08/05/14 0933 08/06/14 0524  WBC 4.7 4.5  NEUTROABS 2.9  --   HGB 9.8* 8.8*  HCT 32.3* 28.3*  MCV 101.6* 99.3  PLT 463* 456*   Alcohol Level:  Recent Labs Lab 08/05/14 1946  ETH <5   Studies/Results: Dg Chest 2 View  08/05/2014   CLINICAL DATA:  Shortness of breath and intermittent cough  EXAM: CHEST  2 VIEW  COMPARISON:  June 26, 2014 and November 13, 2013  FINDINGS: There is underlying emphysematous change with bullous disease throughout the upper bones. There is interstitial prominence in both lower lobes, felt to be due to redistribution of blood flow to a viable segments. There is no frank edema or consolidation. Heart size is within normal limits. Pulmonary vascularity is stable and reflects the underlying emphysematous change. No adenopathy. No bone lesions.  IMPRESSION: Underlying emphysematous change, most severe in the upper lobes. No edema or consolidation.   Electronically Signed   By: Lowella Grip III M.D.   On: 08/05/2014 10:10   Ct Head Wo Contrast  08/05/2014   CLINICAL DATA:  Recent confusion.  Questionable fall  EXAM: CT HEAD WITHOUT CONTRAST  TECHNIQUE: Contiguous axial images were obtained from the base of the skull through the vertex without intravenous contrast.  COMPARISON:  November 13, 2013  FINDINGS: There is age related volume loss. There is no intracranial mass, hemorrhage, extra-axial fluid collection, or midline shift. The gray-white compartments appear normal. No acute infarct apparent. The bony calvarium appears intact. The mastoid air cells are clear. There is a small benign osteoma in the right ethmoid sinus region. There is rightward deviation of the nasal septum. There is opacification of a single ethmoid air cell on the right. There has been significant interval clearing of sinusitis compared to prior study.  IMPRESSION: Age related  volume loss. No intracranial mass, hemorrhage, extra-axial fluid collection, or evidence suggesting focal infarct. Small focus of right ethmoid sinusitis.   Electronically Signed   By: Lowella Grip III M.D.   On: 08/05/2014 12:57   Ct Abdomen Pelvis W Contrast  08/05/2014   CLINICAL DATA:  Chronic generalized abdominal pain.  EXAM: CT ABDOMEN AND PELVIS WITH CONTRAST  TECHNIQUE: Multidetector CT imaging of the abdomen and pelvis was performed using the standard protocol following bolus administration of intravenous contrast.  CONTRAST:  1 OMNIPAQUE IOHEXOL 300 MG/ML  SOLN  COMPARISON:  CT scan of November 27, 2012.  FINDINGS: Mild grade 1 anterolisthesis of L4-5 is noted secondary to posterior facet joint hypertrophy. Emphysematous changes are noted in both lung bases.  No gallstones are noted. The liver, spleen and pancreas appear normal. Adrenal glands appear normal. No hydronephrosis or renal obstruction is noted. Small nonobstructive left renal calculus is noted. There is no evidence of bowel obstruction. Minimal free fluid is noted posteriorly in the pelvis. Atherosclerosis of abdominal aorta is noted without aneurysm formation. Urinary bladder is decompressed. Uterus appears normal. No significant adenopathy is noted.  IMPRESSION: Significant emphysematous disease is noted in both lung bases as noted on prior exam.  Small nonobstructive left renal calculus is noted.  No other significant abnormality seen in the abdomen or pelvis.   Electronically Signed   By: Marijo Conception, M.D.   On: 08/05/2014 13:07   Medications: I have reviewed the patient's current medications. Scheduled Meds: . budesonide  0.5 mg Nebulization QID  . DULoxetine  30 mg Oral BID  . enoxaparin (LOVENOX) injection  40 mg Subcutaneous Q24H  . fluticasone  1 spray Each Nare Daily  . ipratropium-albuterol  3 mL Nebulization QID  . lamoTRIgine  100 mg Oral Daily  . loratadine  10 mg Oral Daily  . mometasone-formoterol  2  puff Inhalation BID  . OLANZapine  5 mg Oral QHS  . pantoprazole  40 mg Oral BID  . sodium chloride  3 mL Intravenous Q12H   Continuous Infusions:  PRN Meds:.acetaminophen, diphenhydrAMINE, guaiFENesin, ondansetron, ondansetron, zolpidem Assessment/Plan:  [name] is a 64 year old female with COPD, iron deficiency anemia, chronic pain, allergic rhinitis, bipolar disorder hospitalized for altered mental status and right upper quadrant abdominal pain found to have mild hypocalcemia.  Altered mental status: Possible in the setting of acute hypercapnic hypoxic respiratory failure given her recent change in oxygen therapy may have suppressed her respiratory drive with subsequent hypercapnia. ABG this morning notable for pH 7.409, CO2 59.0, O2 83.8, bicarbonate 36.6; pH improved to CO2 mostly stable. Electrolytes mostly stable from yesterday and reassuring for no metabolic causes of her presentation. Electrolytes are notable for mild hypocalcemia but otherwise reassuring for no overt metabolic causes. Serum ethanol also reassuring. -Follow-up pending UDS -Continue home oxygen at 3  L -Continue neuro checks and consider BiPAP should she decompensate  Right upper quadrant abdominal pain: Possibly acute gastritis in the setting of NSAID use given that she was recently started naproxen though her reported use is not overtly concerning. Peptic ulcer disease also possible as well. Lipase is additionally reassuring along with LFTs for no acute hepatobiliary process. Unsure if her ongoing diarrhea is related to this pain.  -Check C. difficile PCR -Stop naproxen for now -Continue Protonix 40 mg twice daily  Chronic diarrhea: She reported that her diarrhea has been ongoing for years though feels that has worsened acutely and is concerning in the context of antibiotic use [Levaquin in April, Ceftin your most recently] for possible C. difficile colitis though imaging findings were reassuring yesterday. It is  also quite possible that her symptoms may be an adverse effect of these medications. Additionally, she has not reported bowel movement since admission.  Magnesium 1.7 this morning. -Repeat BMET tomorrow  Iron deficiency anemia: Hemoglobin 8.8 this morning, baseline 9-11 since January though decreased from 9.8 yesterday on admission. She was on iron supplementation following her January hospitalization though reported nonadherence to therapy given GI side effects. -Check FOBT -Consider consulting GI for additional recommendations -Recheck CBC tomorrow  COPD: No signs of wheezing or worsening respiratory symptoms as her cough appears to be chronic. Per chart review, it appears she never followed up to have any of her outpatient PFTs performed, dating as far back as 2012. Home medications include Advair 1 puff twice daily, DuoNeb's 4 times daily, albuterol 1 puff 2-4 hours as needed for wheezing. -Continue Dulera 2 puffs twice daily instead of Advair along with the remainder of her home medications  Chronic pain: Per [place] DEA database, she recently filled oxycodone 5 mg 30 tablets on 07/29/14. She is also on Neurontin 300 mg 3 times daily. -Hold pending resolution of mental status and gastric symptoms  Hypocalcemia: Corrected calcium 8.5, stable from 2 weeks ago.  -Consider calcium supplementation  Bipolar disorder: Her medications include Cymbalta 30 mg twice daily, Lamictal 100 mg daily, Zyprexa 5 mg daily at bedtime. -Continue her home medications  Allergic rhinitis: Her medications include Zyrtec 10 mg daily, Flonase 1 spray in both nostrils daily, Benadryl 25 mg as needed for itching. -Continue home medications  #FEN:  -Diet: Heart healthy  #DVT prophylaxis: Lovenox  #CODE STATUS: DNR/DNI -Defer to sister [name] [phone] if patients lacks decision-making capacity -Confirmed with patient on admission  Dispo: Disposition is deferred at this time, awaiting improvement of  current medical problems.    The patient does have a current PCP (Rushil Sherrye Payor, MD) and does need an [facility] hospital follow-up appointment after discharge.  The patient does have transportation limitations that hinder transportation to clinic appointments.  .Services Needed at time of discharge: Y = Yes, Blank = No PT:   OT:   RN:   Equipment:   Other:       Riccardo Dubin, MD 08/06/2014, 10:49 AM

## 2014-08-06 NOTE — Progress Notes (Signed)
OT Cancellation Note  Patient Details Name: [name] MRN: [id number] DOB: Jul 19, 1950   Cancelled Treatment:    Reason Eval/Treat Not Completed: Other (comment) (Pt eating breakfast and requests OT to reattempt.) OT to reattempt.  Hortencia Pilar 08/06/2014, 8:59 AM

## 2014-08-06 NOTE — Progress Notes (Signed)
Patient stated that she is still feeling pressure in her abdomen.  Abdomen is distended and tender to touch. She said that shifting positions does not help nor do pain medications.  Suggested a warm pack, she is willing to try.  Will update whether successful or not.  Patient states that she is still seeing shadows in room from both sides.  Patient currently has sitter.  Will continue to monitor.

## 2014-08-07 DIAGNOSIS — F3130 Bipolar disorder, current episode depressed, mild or moderate severity, unspecified: Secondary | ICD-10-CM

## 2014-08-07 DIAGNOSIS — R4182 Altered mental status, unspecified: Secondary | ICD-10-CM | POA: Diagnosis not present

## 2014-08-07 DIAGNOSIS — K529 Noninfective gastroenteritis and colitis, unspecified: Secondary | ICD-10-CM | POA: Diagnosis not present

## 2014-08-07 DIAGNOSIS — R1011 Right upper quadrant pain: Secondary | ICD-10-CM | POA: Diagnosis not present

## 2014-08-07 DIAGNOSIS — D509 Iron deficiency anemia, unspecified: Secondary | ICD-10-CM | POA: Diagnosis not present

## 2014-08-07 DIAGNOSIS — F3164 Bipolar disorder, current episode mixed, severe, with psychotic features: Secondary | ICD-10-CM | POA: Diagnosis not present

## 2014-08-07 LAB — BASIC METABOLIC PANEL
Anion gap: 11 (ref 5–15)
BUN: 6 mg/dL (ref 6–20)
CHLORIDE: 99 mmol/L — AB (ref 101–111)
CO2: 34 mmol/L — ABNORMAL HIGH (ref 22–32)
Calcium: 7.7 mg/dL — ABNORMAL LOW (ref 8.9–10.3)
Creatinine, Ser: <0.3 mg/dL — ABNORMAL LOW (ref 0.44–1.00)
Glucose, Bld: 78 mg/dL (ref 65–99)
Potassium: 3.3 mmol/L — ABNORMAL LOW (ref 3.5–5.1)
Sodium: 144 mmol/L (ref 135–145)

## 2014-08-07 LAB — CBC
HCT: 29 % — ABNORMAL LOW (ref 36.0–46.0)
Hemoglobin: 9.1 g/dL — ABNORMAL LOW (ref 12.0–15.0)
MCH: 31.9 pg (ref 26.0–34.0)
MCHC: 31.4 g/dL (ref 30.0–36.0)
MCV: 101.8 fL — AB (ref 78.0–100.0)
PLATELETS: 471 10*3/uL — AB (ref 150–400)
RBC: 2.85 MIL/uL — ABNORMAL LOW (ref 3.87–5.11)
RDW: 16.9 % — AB (ref 11.5–15.5)
WBC: 5.2 10*3/uL (ref 4.0–10.5)

## 2014-08-07 LAB — TSH: TSH: 3.929 u[IU]/mL (ref 0.350–4.500)

## 2014-08-07 MED ORDER — POLYETHYLENE GLYCOL 3350 17 G PO PACK
17.0000 g | PACK | Freq: Two times a day (BID) | ORAL | Status: DC
  Administered 2014-08-07 – 2014-08-10 (×5): 17 g via ORAL
  Filled 2014-08-07 (×6): qty 1

## 2014-08-07 MED ORDER — GABAPENTIN 300 MG PO CAPS
300.0000 mg | ORAL_CAPSULE | Freq: Three times a day (TID) | ORAL | Status: DC
  Administered 2014-08-07 – 2014-08-12 (×16): 300 mg via ORAL
  Filled 2014-08-07 (×14): qty 1

## 2014-08-07 MED ORDER — SENNOSIDES-DOCUSATE SODIUM 8.6-50 MG PO TABS
2.0000 | ORAL_TABLET | Freq: Two times a day (BID) | ORAL | Status: DC
  Administered 2014-08-07 – 2014-08-09 (×5): 2 via ORAL
  Filled 2014-08-07 (×6): qty 2

## 2014-08-07 MED ORDER — ARIPIPRAZOLE 10 MG PO TABS
5.0000 mg | ORAL_TABLET | Freq: Two times a day (BID) | ORAL | Status: DC
  Administered 2014-08-07 – 2014-08-12 (×10): 5 mg via ORAL
  Filled 2014-08-07 (×10): qty 1

## 2014-08-07 MED ORDER — ACETAMINOPHEN 325 MG PO TABS: 650.0000 mg | ORAL_TABLET | Freq: Once | ORAL | Status: AC

## 2014-08-07 MED ORDER — POTASSIUM CHLORIDE CRYS ER 20 MEQ PO TBCR
40.0000 meq | EXTENDED_RELEASE_TABLET | Freq: Once | ORAL | Status: AC
  Administered 2014-08-07: 40 meq via ORAL
  Filled 2014-08-07: qty 2

## 2014-08-07 MED ORDER — MAGNESIUM CITRATE PO SOLN
1.0000 | Freq: Once | ORAL | Status: AC
  Administered 2014-08-07: 1 via ORAL
  Filled 2014-08-07: qty 296

## 2014-08-07 NOTE — Discharge Summary (Signed)
Name: [name] MRN: [id number] DOB: [birth year] 64 y.o. PCP: Riccardo Dubin, MD  Date of Admission: 08/05/2014  8:43 AM Date of Discharge: 08/12/2014 Attending Physician: Sid Falcon, MD  Discharge Diagnosis:  Altered mental status likely 2/2 medication side effect Bipolar disorder complicated by ongoing visual hallucinations Acute-on-chronic abdominal pain likely 2/2 constipation.  Iron deficiency anemia COPD on home oxygen 3L Chronic pain Hypocalcemia Allergic rhinitis: Stable on home medications.   Discharge Medications:   Medication List    STOP taking these medications        cefdinir 300 MG capsule  Commonly known as:  OMNICEF     diphenhydrAMINE 25 MG tablet  Commonly known as:  BENADRYL     naproxen 500 MG tablet  Commonly known as:  NAPROSYN     OLANZapine 5 MG tablet  Commonly known as:  ZYPREXA     oxyCODONE 5 MG immediate release tablet  Commonly known as:  Oxy IR/ROXICODONE     predniSONE 10 MG tablet  Commonly known as:  DELTASONE      TAKE these medications        ARIPiprazole 5 MG tablet  Commonly known as:  ABILIFY  Take 1 tablet (5 mg total) by mouth 2 (two) times daily.     budesonide 0.5 MG/2ML nebulizer solution  Commonly known as:  PULMICORT  Take 2 mLs (0.5 mg total) by nebulization 2 (two) times daily. DX j43.8     calcium carbonate 500 MG chewable tablet  Commonly known as:  TUMS - dosed in mg elemental calcium  Chew 1 tablet (200 mg of elemental calcium total) by mouth 3 (three) times daily with meals.     cetirizine 10 MG tablet  Commonly known as:  ZYRTEC  Take 10 mg by mouth daily.     DULoxetine 30 MG capsule  Commonly known as:  CYMBALTA  Take 30 mg by mouth 2 (two) times daily.     fluticasone 50 MCG/ACT nasal spray  Commonly known as:  FLONASE  Place 1 spray into both nostrils daily.     Fluticasone-Salmeterol 250-50 MCG/DOSE Aepb  Commonly known as:  ADVAIR DISKUS  INHALE 1 PUFF INTO THE LUNGS 2 TIMES  DAILY     gabapentin 300 MG capsule  Commonly known as:  NEURONTIN  Take 300 mg by mouth 3 (three) times daily.     ipratropium-albuterol 0.5-2.5 (3) MG/3ML Soln  Commonly known as:  DUONEB  4 times a day (breakfast, lunch, dinner, bedtime).Can take 2 additional treatments if needed on bad days. DX J43.8     lamoTRIgine 100 MG tablet  Commonly known as:  LAMICTAL  Take 100 mg by mouth daily.     ondansetron 4 MG disintegrating tablet  Commonly known as:  ZOFRAN ODT  Take 1 tablet (4 mg total) by mouth every 8 (eight) hours as needed for nausea or vomiting.     oxyCODONE-acetaminophen 5-325 MG per tablet  Commonly known as:  PERCOCET/ROXICET  Take 1 tablet by mouth every 12 (twelve) hours as needed for severe pain.     pantoprazole 40 MG tablet  Commonly known as:  PROTONIX  Take 1 tablet (40 mg total) by mouth 2 (two) times daily.     polyethylene glycol packet  Commonly known as:  MIRALAX / GLYCOLAX  Take 17 g by mouth daily as needed for moderate constipation.     PROAIR HFA 108 (90 BASE) MCG/ACT inhaler  Generic drug:  albuterol  INHALE ONE PUFF BY MOUTH EVERY 2 TO 4 HOURS AS NEEDED FOR WHEEZING     zolpidem 5 MG tablet  Commonly known as:  AMBIEN  Take 1 tablet (5 mg total) by mouth at bedtime as needed for sleep.        Disposition and follow-up:   [name] was discharged from [facility] in Stable condition.  At the hospital follow up visit please address:  Hypocalcemia: recheck CMET on Tums   Pain control: assess for mental status and fall risk  Post-SNF placement: consider ALF placement  Follow-up with psychiatry  Iron deficiency anemia: consider Niferix therapy instead of ferrous sulfate   Follow-up Appointments: Follow-up Information    Follow up with [facility]. Go on 08/13/2014.   Why:  medication managment, counseling services, Outpatient psychiatric follow-up   Contact information:   [place] [phone]      Follow up with Drucilla Schmidt, MD. Go on 08/18/2014.   Specialty:  Internal Medicine   Why:  215PM   Contact information:   [place] [address] [phone]       Discharge Instructions: Discharge Instructions    Call MD for:  difficulty breathing, headache or visual disturbances    Complete by:  As directed      Call MD for:  persistant dizziness or light-headedness    Complete by:  As directed      Call MD for:  severe uncontrolled pain    Complete by:  As directed      Diet - low sodium heart healthy    Complete by:  As directed      Increase activity slowly    Complete by:  As directed            Consultations: Treatment Team:  Ambrose Finland, MD  Procedures Performed:  Dg Chest 2 View  08/05/2014   CLINICAL DATA:  Shortness of breath and intermittent cough  EXAM: CHEST  2 VIEW  COMPARISON:  June 26, 2014 and November 13, 2013  FINDINGS: There is underlying emphysematous change with bullous disease throughout the upper bones. There is interstitial prominence in both lower lobes, felt to be due to redistribution of blood flow to a viable segments. There is no frank edema or consolidation. Heart size is within normal limits. Pulmonary vascularity is stable and reflects the underlying emphysematous change. No adenopathy. No bone lesions.  IMPRESSION: Underlying emphysematous change, most severe in the upper lobes. No edema or consolidation.   Electronically Signed   By: Lowella Grip III M.D.   On: 08/05/2014 10:10   Ct Head Wo Contrast  08/05/2014   CLINICAL DATA:  Recent confusion.  Questionable fall  EXAM: CT HEAD WITHOUT CONTRAST  TECHNIQUE: Contiguous axial images were obtained from the base of the skull through the vertex without intravenous contrast.  COMPARISON:  November 13, 2013  FINDINGS: There is age related volume loss. There is no intracranial mass, hemorrhage, extra-axial fluid collection, or  midline shift. The gray-white compartments appear normal. No acute infarct apparent. The bony calvarium appears intact. The mastoid air cells are clear. There is a small benign osteoma in the right ethmoid sinus region. There is rightward deviation of the nasal septum. There is opacification of a single ethmoid air cell on the right. There has been significant interval clearing of sinusitis compared to prior study.  IMPRESSION: Age related volume loss. No intracranial mass, hemorrhage, extra-axial fluid collection,  or evidence suggesting focal infarct. Small focus of right ethmoid sinusitis.   Electronically Signed   By: Lowella Grip III M.D.   On: 08/05/2014 12:57   Ct Abdomen Pelvis W Contrast  08/05/2014   CLINICAL DATA:  Chronic generalized abdominal pain.  EXAM: CT ABDOMEN AND PELVIS WITH CONTRAST  TECHNIQUE: Multidetector CT imaging of the abdomen and pelvis was performed using the standard protocol following bolus administration of intravenous contrast.  CONTRAST:  1 OMNIPAQUE IOHEXOL 300 MG/ML  SOLN  COMPARISON:  CT scan of November 27, 2012.  FINDINGS: Mild grade 1 anterolisthesis of L4-5 is noted secondary to posterior facet joint hypertrophy. Emphysematous changes are noted in both lung bases.  No gallstones are noted. The liver, spleen and pancreas appear normal. Adrenal glands appear normal. No hydronephrosis or renal obstruction is noted. Small nonobstructive left renal calculus is noted. There is no evidence of bowel obstruction. Minimal free fluid is noted posteriorly in the pelvis. Atherosclerosis of abdominal aorta is noted without aneurysm formation. Urinary bladder is decompressed. Uterus appears normal. No significant adenopathy is noted.  IMPRESSION: Significant emphysematous disease is noted in both lung bases as noted on prior exam.  Small nonobstructive left renal calculus is noted.  No other significant abnormality seen in the abdomen or pelvis.   Electronically Signed   By: Marijo Conception, M.D.   On: 08/05/2014 13:07    Admission HPI: Ms. [name] is a 64 year old female with COPD, iron deficiency anemia, chronic pain, allergic rhinitis, bipolar disorder who presented with confusion and abdominal pain..  The last thing she recalls this morning waking up in bed. When asked how she got to the hospital, she reported that the home health aide had been describing to her that she had observed the patient experienced a lot of "confused feeling." She is unaware if she ever lost consciousness or fell though denies any dizziness, numbness, tingling. She reports recently followed up with pulmonology on 07/31/14 at which point her oxygen was increased from 3-4 L; per the office note, she was switched to continuous oxygen therapy she reports ever since this change, she has been unable to move around as much since she feels the size of the oxygen tank inhibits her. She was also treated for an acute COPD exacerbation with prednisone taper [40 mg 2 days, 30 mg 2 days, 20 mg 2 days, 10 mg] along with cefdinir 300 mg 5 days.  Additionally, she reports abdominal pain localized to the right upper quadrant which she feels is consistent abdominal pain she described her last office visit 07/17/14 at which time she was prescribed Protonix 40 mg twice daily for GERD as well as Zofran tablets as needed for nausea/vomiting. She reports this pain has been ongoing for months and is associated with 4-5 episodes of loose brown, nonbloody stool per day. Otherwise, she denies fever, chills, worsening shortness of breath though reports cough productive of brown sputum and wheezing. She continues to smoke 2 cigarettes a day though denies any recent alcohol or illicit drug use and lives by herself.   In the emergency department, abdominal CT was notable for small nonobstructive left renal calculus and head CT reassuring. ABG was notable for pH 7.332, CO2 62.1, O2 59.0, bicarbonate 32.7. She was given Levaquin 750 mg  IV, morphine 4 mg, and Solu-Medrol 125 mg IV.  Hospital Course by problem list:  Altered mental status complicated by ongoing visual hallucinations: Likely 2/2 oversedation of her home pain medication. Initially, it was  thought that her recent increase in home oxygen from 3->4L may be contributory with hypercapnia, so she her oxygen was decreased back to the original amount. TSH, B12 reassuring; folate was pending at the time of discharge. Her home health aide also reported that she is concerned the patient is having recurrent falls that she cannot recall. Oxycodone was held at the time of admission to allow for clear assessment of her mental status though was restarted at oxyIR 5mg  q8h prn for pain. She usually required 2-3 doses throughout her stay though reported to [place] that she was taking 4 doses/daily on the day of discharge. Upon speaking with her pain clinic provider, NP Tarry Kos [phone], she is supposed to be on this medication q12h and will be prescribed this amount on the day of discharge.Given the concern for her personal safety at home and the report of the home health aide, PT/OT recommended SNF placement and should she no longer be appropriate for SNF care, please consider ALF placement given the concerns noted thus far.  Bipolar disorder complicated by ongoing visual hallucinations: During her hospital stay, she reported having hallucinations of shadows though did not instruct her to harm herself or others. Given her prior history of passive suicidal ideation, she required a sitter for 48 hours for supervision but did not have any increased agitation. Psychiatry was consulted for medication management, and they recommended discontinuing Cymbalta and Zyprexa while starting Abilify 5mg  twice daily. She was continued on Lamictal and should follow-up with psychiatry for reassessment.  Acute-on-chronic abdominal pain: Likely 2/2 constipation.  Two days into her admission, she did  not have a bowel movement and required an enema. Labwork and imaging was reassuring for no acute hepatitis, acute cholecystitis, acute pancreatitis. As she had been recently started on naproxen for hip pain, this medication was held out of concern for acute gastritis that could have been contributory. She was off her chronic opioid therapy for several days as noted above but was resumed at a reduced dose to avoid opioid withdrawal. She was continued on her home PPI and was instructed to take Miralax as needed for when she didn't have bowel movements.  Iron deficiency anemia: Her hemoglobin was stable at  9-11 which is consistent with her baseline since January at which time she was hospitalized for symptomatic anemia with hemoglobin 5.2 and found to have iron deficiency. Though she was discharged on iron supplementation, she discontinued taking this medication given GI side effects.   COPD: No signs of wheezing noted on exam. Per chart review, it appears she never followed up to have any of her outpatient PFTs performed, dating as far back as 2012. She was continued on her home medications and tolerated ambulating as well. Please maintain her O2 sats at 88-92%.  Chronic pain: Per [place] DEA database, she recently filled oxycodone 5 mg 30 tablets on 07/29/14. She is also on Neurontin 300 mg 3 times daily. Her pain clinic was contacted as noted above, and she was discharged on a lower dose.  Hypocalcemia: Corrected calcium 8.5 on admission, stable from 2 weeks ago. She was started on Tums 500mg  TID with meals. CMET should be rechecked in 1-2 weeks to assess response to therapy.  Allergic rhinitis: Stable on home medications.   Discharge Vitals:   BP 120/63 mmHg  Pulse 65  Temp(Src) 98.6 F (37 C) (Oral)  Resp 15  Ht 5' (1.524 m)  Wt 91 lb 7.9 oz (41.5 kg)  BMI 17.87 kg/m2  SpO2  96%  Discharge Labs:  No results found for this or any previous visit (from the past 24 hour(s)).  Signed: Riccardo Dubin, MD 08/12/2014, 1:36 PM    Services Ordered on Discharge: SNF Equipment Ordered on Discharge: walker

## 2014-08-07 NOTE — Consult Note (Signed)
[facility] Consult   Reason for Consult:  Bipolar disorder medication management Referring Physician:  Dr. Lind Guest Patient Identification: [name] MRN:  [id number] Principal Diagnosis: Bipolar I disorder, most recent episode depressed Diagnosis:   Patient Active Problem List   Diagnosis Date Noted  . SOB (shortness of breath) [R06.02] 08/05/2014  . Abdominal pain [R10.9] 08/05/2014  . Diarrhea [R19.7] 08/05/2014  . Acute encephalopathy [G93.40] 08/05/2014  . Acute respiratory failure with hypoxia and hypercarbia [J96.01] 08/05/2014  . Generalized abdominal pain [R10.84] 07/17/2014  . Chronic respiratory failure [J96.10] 07/03/2014  . Acute diarrhea [R19.7] 05/01/2014  . Iron deficiency anemia [D50.9] 11/12/2013  . COPD exacerbation [J44.1] 05/14/2013  . Health care maintenance [Z00.00] 05/14/2013  . Nausea and vomiting [R11.2] 04/17/2013  . Insomnia [G47.00] 03/06/2013  . GERD (gastroesophageal reflux disease) [K21.9] 03/06/2013  . Protein-calorie malnutrition, severe [E43] 03/04/2013  . Chronic pain [G89.29] 03/02/2013  . Dyslipidemia [E78.5] 02/26/2013  . Loss of weight [R63.4] 08/08/2012  . Seasonal allergies [J30.2] 07/09/2012  . 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 Bronchitis, allergic [J45.909] 06/22/2010  . NARCOTIC ABUSE [IMO0002] 03/25/2010  . OSTEOPOROSIS [M81.0] 06/17/2009  . BIPOLAR AFFECTIVE DISORDER, DEPRESSED, HX OF [Z86.59] 06/02/2009  . DEFICIENCY, VITAMIN D NOS [E55.9] 01/03/2007  . DEPRESSION [F32.9] 12/16/2005  . BACK PAIN, LUMBAR [M54.5] 12/07/2005    Total Time spent with patient: 1 hour  Subjective:   [name] is a 64 y.o. female patient admitted with confusion and abdominal pain.. .  HPI:  [name] he is a 64 years old female admitted to [facility] with multiple medical problems and also increased visual hallucinations , confusion and abdominal  pain. Psychiatric consultation requested for medication management of bipolar disorder. Patient reported she has been suffering with bipolar disorder over several years and has been receiving outpatient medication management from [facility].  Patient stated that new program [facility] has stop taking her health insurance and not providing services. Patient stated she is having hard time to find a new provider even though trying to call several phone numbers. Patient also reported she get medication delivered to her home and she receives transportation from the insurance company probably  [employer] from [employer].  Patient has been living individual on her family members are everywhere else. She has a friend who has been taking her to the local grocery stores. Patient indicated she has been seeing black   Human shadows coming out of the every corner  Both day and night which is making her more scary and unable to sleep.  Reportedly patient was recently received tapered dose of  prednisolone medication for COPD. Patient reportedly has no changes in her psychiatric medication in the recent past. Patient reportedly taking her medication as prescribed. Patient denied drug of abuse.  Reportedly she smoking 2 cigarettes a day from 2 packs per day.  Medical history: [name] is a 64 year old female with COPD, iron deficiency anemia, chronic pain, allergic rhinitis, bipolar disorder who presented with confusion and abdominal pain.Marland Kitchen  HPI Elements:   Location:   bipolar disorder. Quality:    unable to sleep secondary to hallucinations. Severity:   moderate to severe. Timing:   questionable stated I treatment for te COPD. Duration:   few days. Context:   multiple  medical and psychosocial issues.  Past Medical History:  Past Medical History  Diagnosis Date  . Depression   . Suicidal  ideation 2007     attempted overdose 2012  Dr. Tivis Ringer report  . Bipolar  disorder   . Substance abuse      narcotics, alcohol, tobacco  . Chronic pain syndrome     follows at [facility] pain [facility]ment  . DEFICIENCY, VITAMIN D NOS 01/03/2007  . Chronic lower back pain   . Anxiety   . OSTEOPOROSIS 06/17/2009    DEXA 05/2009 : L femur -2.9; R femur -2.5. Alendronate on med list but not taking. Needs addressed ASAP as h/o fractures.    . Elevated liver function tests   . On home oxygen therapy     "3L; 24/7" (03/20/2014)  . Shortness of breath     "all the time" (02/27/2013)  . 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 Sciatic pain   . Anemia   . Pneumonia X 1?  . Hyperthyroidism     "borderline"  . History of blood transfusion     "getting my 1st later tonight" (03/20/2014)  . Arthritis     "legs, back; hips, primarily left hip" (03/20/2014)    Past Surgical History  Procedure Laterality Date  . Orif ankle fracture Left 10/2010  . Cesarean section  1974; 1979  . Tubal ligation  1979  . Augmentation mammaplasty  ~ 2007  . Fracture surgery    . Ankle debridement Left 10/2010   Family History:  Family History  Problem Relation Age of Onset  . Stroke Neg Hx   . Cancer Neg Hx   . Myelodysplastic syndrome Father     Died from myelofibrosis though diagnosed post-mortem   Social History:  History  Alcohol Use  . 0.0 oz/week  . 0 Standard drinks or equivalent per week    Comment: 03/20/2014 "nothing in 7 years; never had a problem w/it"     History  Drug Use No    History   Social History  . Marital Status: Divorced    Spouse Name: N/A  . Number of Children: 2  . Years of Education: N/A   Occupational History  . retired    Social History Main Topics  . Smoking status: Current Every Day Smoker -- 0.20 packs/day for 45 years    Types: Cigarettes  . Smokeless tobacco: Never Used     Comment: 03/20/2014 "smoking 2 cigarettes/day; that's down from 2 ppd"  . Alcohol Use: 0.0 oz/week    0 Standard drinks or  equivalent per week     Comment: 03/20/2014 "nothing in 7 years; never had a problem w/it"  . Drug Use: No  . Sexual Activity: No   Other Topics Concern  . 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  She has been living alone and has a friend since the neighborhood. She continued to smoke tobacco with limited.                          Allergies:   Allergies  Allergen Reactions  .  Banana Shortness Of Breath and Nausea And Vomiting  . Lyrica [Pregabalin]     "Disorients me"  . Pollen Extract Other (See Comments)    Seasonal     Labs:  Results for orders placed or performed during the hospital encounter of 08/05/14 (from the past 48 hour(s))  CBC with Differential     Status: Abnormal   Collection Time: 08/05/14  9:33 AM  Result Value Ref Range   WBC 4.7 4.0 - 10.5 K/uL   RBC 3.18 (L) 3.87 - 5.11 MIL/uL   Hemoglobin 9.8 (L) 12.0 - 15.0 g/dL   HCT 32.3 (L) 36.0 - 46.0 %   MCV 101.6 (H) 78.0 - 100.0 fL   MCH 30.8 26.0 - 34.0 pg   MCHC 30.3 30.0 - 36.0 g/dL   RDW 17.1 (H) 11.5 - 15.5 %   Platelets 463 (H) 150 - 400 K/uL   Neutrophils Relative % 62 43 - 77 %   Neutro Abs 2.9 1.7 - 7.7 K/uL   Lymphocytes Relative 26 12 - 46 %   Lymphs Abs 1.2 0.7 - 4.0 K/uL   Monocytes Relative 10 3 - 12 %   Monocytes Absolute 0.5 0.1 - 1.0 K/uL   Eosinophils Relative 1 0 - 5 %   Eosinophils Absolute 0.1 0.0 - 0.7 K/uL   Basophils Relative 1 0 - 1 %   Basophils Absolute 0.0 0.0 - 0.1 K/uL  Comprehensive metabolic panel     Status: Abnormal   Collection Time: 08/05/14  9:33 AM  Result Value Ref Range    Sodium 141 135 - 145 mmol/L   Potassium 3.5 3.5 - 5.1 mmol/L   Chloride 100 (L) 101 - 111 mmol/L   CO2 30 22 - 32 mmol/L   Glucose, Bld 87 65 - 99 mg/dL   BUN <5 (L) 6 - 20 mg/dL   Creatinine, Ser <0.30 (L) 0.44 - 1.00 mg/dL   Calcium 7.9 (L) 8.9 - 10.3 mg/dL   Total Protein 5.8 (L) 6.5 - 8.1 g/dL   Albumin 3.2 (L) 3.5 - 5.0 g/dL   AST 25 15 - 41 U/L   ALT 18 14 - 54 U/L   Alkaline Phosphatase 76 38 - 126 U/L   Total Bilirubin 0.5 0.3 - 1.2 mg/dL   GFR calc non Af Amer NOT CALCULATED >60 mL/min   GFR calc Af Amer NOT CALCULATED >60 mL/min    Comment: (NOTE) The eGFR has been calculated using the CKD EPI equation. This calculation has not been validated in all clinical situations. eGFR's persistently <60 mL/min signify possible Chronic Kidney Disease.    Anion gap 11 5 - 15  Lipase, blood     Status: Abnormal   Collection Time: 08/05/14  9:33 AM  Result Value Ref Range   Lipase 15 (L) 22 - 51 U/L  I-stat troponin, ED     Status: None   Collection Time: 08/05/14  9:38 AM  Result Value Ref Range   Troponin i, poc 0.01 0.00 - 0.08 ng/mL   Comment 3            Comment: Due to the release kinetics of cTnI, a negative result within the first hours of the onset of symptoms does not rule out myocardial infarction with certainty. If myocardial infarction is still suspected, repeat the test at appropriate intervals.   Urinalysis, Routine w reflex microscopic     Status: Abnormal   Collection Time: 08/05/14 11:04 AM  Result Value Ref Range  Color, Urine YELLOW YELLOW    Comment: LESS THAN 10 mL OF URINE SUBMITTED   APPearance CLEAR CLEAR   Specific Gravity, Urine 1.007 1.005 - 1.030   pH 6.0 5.0 - 8.0   Glucose, UA NEGATIVE NEGATIVE mg/dL   Hgb urine dipstick NEGATIVE NEGATIVE   Bilirubin Urine NEGATIVE NEGATIVE   Ketones, ur 15 (A) NEGATIVE mg/dL   Protein, ur NEGATIVE NEGATIVE mg/dL   Urobilinogen, UA 0.2 0.0 - 1.0 mg/dL   Nitrite NEGATIVE NEGATIVE   Leukocytes, UA  NEGATIVE NEGATIVE    Comment: MICROSCOPIC NOT DONE ON URINES WITH NEGATIVE PROTEIN, BLOOD, LEUKOCYTES, NITRITE, OR GLUCOSE <1000 mg/dL.  I-Stat arterial blood gas, ED     Status: Abnormal   Collection Time: 08/05/14  2:57 PM  Result Value Ref Range   pH, Arterial 7.332 (L) 7.350 - 7.450   pCO2 arterial 62.1 (HH) 35.0 - 45.0 mmHg   pO2, Arterial 59.0 (L) 80.0 - 100.0 mmHg   Bicarbonate 32.7 (H) 20.0 - 24.0 mEq/L   TCO2 34 0 - 100 mmol/L   O2 Saturation 86.0 %   Acid-Base Excess 5.0 (H) 0.0 - 2.0 mmol/L   Patient temperature 99.7 F    Collection site RADIAL, ALLEN'S TEST ACCEPTABLE    Drawn by Operator    Sample type ARTERIAL    Comment NOTIFIED PHYSICIAN   Ethanol     Status: None   Collection Time: 08/05/14  7:46 PM  Result Value Ref Range   Alcohol, Ethyl (B) <5 <5 mg/dL    Comment:        LOWEST DETECTABLE LIMIT FOR SERUM ALCOHOL IS 11 mg/dL FOR MEDICAL PURPOSES ONLY   Basic metabolic panel     Status: Abnormal   Collection Time: 08/06/14  5:24 AM  Result Value Ref Range   Sodium 138 135 - 145 mmol/L   Potassium 3.5 3.5 - 5.1 mmol/L   Chloride 93 (L) 101 - 111 mmol/L   CO2 35 (H) 22 - 32 mmol/L   Glucose, Bld 70 65 - 99 mg/dL   BUN 6 6 - 20 mg/dL   Creatinine, Ser 0.31 (L) 0.44 - 1.00 mg/dL   Calcium 7.8 (L) 8.9 - 10.3 mg/dL   GFR calc non Af Amer >60 >60 mL/min   GFR calc Af Amer >60 >60 mL/min    Comment: (NOTE) The eGFR has been calculated using the CKD EPI equation. This calculation has not been validated in all clinical situations. eGFR's persistently <60 mL/min signify possible Chronic Kidney Disease.    Anion gap 10 5 - 15  Magnesium     Status: None   Collection Time: 08/06/14  5:24 AM  Result Value Ref Range   Magnesium 1.7 1.7 - 2.4 mg/dL  CBC     Status: Abnormal   Collection Time: 08/06/14  5:24 AM  Result Value Ref Range   WBC 4.5 4.0 - 10.5 K/uL   RBC 2.85 (L) 3.87 - 5.11 MIL/uL   Hemoglobin 8.8 (L) 12.0 - 15.0 g/dL   HCT 28.3 (L) 36.0 - 46.0 %    MCV 99.3 78.0 - 100.0 fL   MCH 30.9 26.0 - 34.0 pg   MCHC 31.1 30.0 - 36.0 g/dL   RDW 16.6 (H) 11.5 - 15.5 %   Platelets 456 (H) 150 - 400 K/uL  Blood gas, arterial     Status: Abnormal   Collection Time: 08/06/14  8:30 AM  Result Value Ref Range   O2 Content 4.0 L/min  Delivery systems NASAL CANNULA    pH, Arterial 7.409 7.350 - 7.450   pCO2 arterial 59.0 (HH) 35.0 - 45.0 mmHg    Comment: CRITICAL RESULT CALLED TO, READ BACK BY AND VERIFIED WITH: TERA CRODD,RN AT 0840, BY NICKY DICK,RRT,RCP ON 08/06/2014 CRITICAL RESULT CALLED TO, READ BACK BY AND VERIFIED WITH: TERA CROSS,RN AT 0840, BY NICKY DICK,RRT,RCP ON 08/06/2014    pO2, Arterial 83.8 80.0 - 100.0 mmHg   Bicarbonate 36.6 (H) 20.0 - 24.0 mEq/L   TCO2 38.4 0 - 100 mmol/L   Acid-Base Excess 11.5 (H) 0.0 - 2.0 mmol/L   O2 Saturation 95.9 %   Patient temperature 98.6    Collection site RIGHT RADIAL    Drawn by [id number]    Sample type ARTERIAL DRAW    Allens test (pass/fail) PASS PASS  Urine rapid drug screen (hosp performed)     Status: None   Collection Time: 08/06/14  8:45 PM  Result Value Ref Range   Opiates NONE DETECTED NONE DETECTED   Cocaine NONE DETECTED NONE DETECTED   Benzodiazepines NONE DETECTED NONE DETECTED   Amphetamines NONE DETECTED NONE DETECTED   Tetrahydrocannabinol NONE DETECTED NONE DETECTED   Barbiturates NONE DETECTED NONE DETECTED    Comment:        DRUG SCREEN FOR MEDICAL PURPOSES ONLY.  IF CONFIRMATION IS NEEDED FOR ANY PURPOSE, NOTIFY LAB WITHIN 5 DAYS.        LOWEST DETECTABLE LIMITS FOR URINE DRUG SCREEN Drug Class       Cutoff (ng/mL) Amphetamine      1000 Barbiturate      200 Benzodiazepine   045 Tricyclics       409 Opiates          300 Cocaine          300 THC              50   Basic metabolic panel     Status: Abnormal   Collection Time: 08/07/14  3:35 AM  Result Value Ref Range   Sodium 144 135 - 145 mmol/L   Potassium 3.3 (L) 3.5 - 5.1 mmol/L   Chloride 99 (L) 101 - 111  mmol/L   CO2 34 (H) 22 - 32 mmol/L   Glucose, Bld 78 65 - 99 mg/dL   BUN 6 6 - 20 mg/dL   Creatinine, Ser <0.30 (L) 0.44 - 1.00 mg/dL   Calcium 7.7 (L) 8.9 - 10.3 mg/dL   GFR calc non Af Amer NOT CALCULATED >60 mL/min   GFR calc Af Amer NOT CALCULATED >60 mL/min    Comment: (NOTE) The eGFR has been calculated using the CKD EPI equation. This calculation has not been validated in all clinical situations. eGFR's persistently <60 mL/min signify possible Chronic Kidney Disease.    Anion gap 11 5 - 15  CBC     Status: Abnormal   Collection Time: 08/07/14  3:35 AM  Result Value Ref Range   WBC 5.2 4.0 - 10.5 K/uL   RBC 2.85 (L) 3.87 - 5.11 MIL/uL   Hemoglobin 9.1 (L) 12.0 - 15.0 g/dL   HCT 29.0 (L) 36.0 - 46.0 %   MCV 101.8 (H) 78.0 - 100.0 fL   MCH 31.9 26.0 - 34.0 pg   MCHC 31.4 30.0 - 36.0 g/dL   RDW 16.9 (H) 11.5 - 15.5 %   Platelets 471 (H) 150 - 400 K/uL  TSH     Status: None   Collection Time: 08/07/14  3:35  AM  Result Value Ref Range   TSH 3.929 0.350 - 4.500 uIU/mL    Vitals: Blood pressure 159/68, pulse 76, temperature 97.9 F (36.6 C), temperature source Axillary, resp. rate 16, weight 40.9 kg (90 lb 2.7 oz), SpO2 98 %.  Risk to Self: Is patient at risk for suicide?: No Risk to Others:   Prior Inpatient Therapy:   Prior Outpatient Therapy:    Current Facility-Administered Medications  Medication Dose Route Frequency Provider Last Rate Last Dose  . acetaminophen (TYLENOL) tablet 650 mg  650 mg Oral Q6H PRN Cresenciano Genre, MD   650 mg at 08/07/14 0657  . budesonide (PULMICORT) nebulizer solution 0.5 mg  0.5 mg Nebulization QID Cresenciano Genre, MD   0.5 mg at 08/07/14 0731  . diphenhydrAMINE (BENADRYL) tablet 25 mg  25 mg Oral PRN Cresenciano Genre, MD      . DULoxetine (CYMBALTA) DR capsule 30 mg  30 mg Oral BID Cresenciano Genre, MD   30 mg at 08/06/14 2147  . enoxaparin (LOVENOX) injection 40 mg  40 mg Subcutaneous Q24H Cresenciano Genre, MD   40 mg at 08/06/14 1859  .  fluticasone (FLONASE) 50 MCG/ACT nasal spray 1 spray  1 spray Each Nare Daily Cresenciano Genre, MD   1 spray at 08/06/14 1244  . guaiFENesin (MUCINEX) 12 hr tablet 600 mg  600 mg Oral BID PRN Cresenciano Genre, MD   600 mg at 08/05/14 1708  . ipratropium-albuterol (DUONEB) 0.5-2.5 (3) MG/3ML nebulizer solution 3 mL  3 mL Nebulization QID Cresenciano Genre, MD   3 mL at 08/07/14 0731  . lamoTRIgine (LAMICTAL) tablet 100 mg  100 mg Oral Daily Cresenciano Genre, MD   100 mg at 08/06/14 1014  . loratadine (CLARITIN) tablet 10 mg  10 mg Oral Daily Cresenciano Genre, MD   10 mg at 08/06/14 1014  . mometasone-formoterol (DULERA) 100-5 MCG/ACT inhaler 2 puff  2 puff Inhalation BID Cresenciano Genre, MD   2 puff at 08/07/14 [id number]  . OLANZapine (ZYPREXA) tablet 5 mg  5 mg Oral QHS Cresenciano Genre, MD   5 mg at 08/06/14 2147  . ondansetron (ZOFRAN) injection 4 mg  4 mg Intravenous Q6H PRN Cresenciano Genre, MD      . ondansetron (ZOFRAN-ODT) disintegrating tablet 4 mg  4 mg Oral Q8H PRN Cresenciano Genre, MD      . pantoprazole (PROTONIX) EC tablet 40 mg  40 mg Oral BID Cresenciano Genre, MD   40 mg at 08/06/14 2147  . polyethylene glycol (MIRALAX / GLYCOLAX) packet 17 g  17 g Oral BID Rushil Patel V, MD      . senna-docusate (Senokot-S) tablet 2 tablet  2 tablet Oral BID Rushil Patel V, MD      . sodium chloride 0.9 % injection 3 mL  3 mL Intravenous Q12H Cresenciano Genre, MD   3 mL at 08/05/14 2128  . zolpidem (AMBIEN) tablet 5 mg  5 mg Oral QHS PRN Cresenciano Genre, MD   5 mg at 08/06/14 2152    Musculoskeletal: Strength & Muscle Tone: decreased Gait & Station: unable to stand Patient leans: N/A  Psychiatric Specialty Exam: Physical Exam as per history and physical  ROS  Increased anxiety , insomnia , seeing  Black shadows , generalized weakness , unable to walk without support , needed oxygen continuous the denied chest pain , dizziness. Negative review of systems other than history  of present illness  Blood pressure 159/68, pulse  76, temperature 97.9 F (36.6 C), temperature source Axillary, resp. rate 16, weight 40.9 kg (90 lb 2.7 oz), SpO2 98 %.Body mass index is 17.61 kg/(m^2).  General Appearance: Guarded  Eye Contact::  Good  Speech:  Clear and Coherent and Slow  Volume:  Decreased  Mood:  Anxious and Depressed  Affect:  Appropriate, Congruent and Depressed  Thought Process:  Coherent and Goal Directed  Orientation:  Full (Time, Place, and Person)  Thought Content:  WDL  Suicidal Thoughts:  No  Homicidal Thoughts:  No  Memory:  Immediate;   Good Recent;   Good  Judgement:  Fair  Insight:  Good  Psychomotor Activity:  Decreased  Concentration:  Fair  Recall:  [place] of Knowledge:Good  Language: Good  Akathisia:  NA  Handed:  Right  AIMS (if indicated):     Assets:  Communication Skills Desire for Improvement Financial Resources/Insurance Housing Leisure Time Resilience Social Support Transportation  ADL's:  Impaired  Cognition: WNL  Sleep:      Medical Decision Making: Review of Psycho-Social Stressors (1), Review or order clinical lab tests (1), Established Problem, Worsening (2), Review or order medicine tests (1), Review of Medication Regimen & Side Effects (2) and Review of New Medication or Change in Dosage (2)  Treatment Plan Summary:  Patient has been suffering with significant mood swings , depression , anxiety , worry about finding a mental health provider to continue her medication management for bipolar depression which is suffering over several years. Patient has been contracting for safety and has no suicidal or homicidal ideation. Patient has been smoking 2 cigarettes a day from 2 packs a day and required oxygen by cannula for COPD Daily contact with patient to assess and evaluate symptoms and progress in treatment and Medication management  Plan:  Provided counseling for tobacco cessation during this evaluation Discontinue Cymbalta secondary to increased side  effects Discontinue Zyprexa -  Not helpful Start Abilify 5 mg twice daily for psychosis dtart Neurontin 300 mg 3 times a day for anxiety Continue lamotrigine 100 mg daily for more swings Insomnia : Ambien 5 mg at bedtime as needed  Patient does not meet criteria for psychiatric inpatient admission. Supportive therapy provided about ongoing stressors.  Disposition:  Patient will be referred to the psychiatric social service to find new outpatient psychiatric medication management as her previous mental health provider not accepting her health insurance at this time  [facility] R. 08/07/2014 9:13 AM

## 2014-08-07 NOTE — Progress Notes (Signed)
MD, please address contact precautions.

## 2014-08-07 NOTE — Progress Notes (Signed)
Pt has large bowel movement, brown in color, after receiving soap suds enema.  Glade Nurse, RN

## 2014-08-07 NOTE — Progress Notes (Signed)
Occupational Therapy Treatment Patient Details Name: [name] MRN: [id number] DOB: [birth year] Today's Date: 08/07/2014    History of present illness [name] is a 64 year old female with COPD, iron deficiency anemia, chronic pain, allergic rhinitis, bipolar disorder who presented with confusion and abdominal pain..   OT comments  Pt progressing towards acute OT goals. Focus of session on standing tolerance/balance and in-room ambulation. Also educated on energy conservation techniques and provided handout. Pt c/o dizziness in standing and during functional mobility. D/c plan remains appropriate.  Follow Up Recommendations  Supervision - Intermittent;Home health OT    Equipment Recommendations  None recommended by OT    Recommendations for Other Services      Precautions / Restrictions Precautions Precautions: Fall       Mobility Bed Mobility               General bed mobility comments: not assessed  Transfers Overall transfer level: Needs assistance Equipment used: Rolling walker (2 wheeled) Transfers: Sit to/from [employer] Transfers Sit to Stand: Supervision Stand pivot transfers: Supervision       General transfer comment: supervision for safety    Balance Overall balance assessment: Needs assistance Sitting-balance support: No upper extremity supported;Feet supported Sitting balance-Leahy Scale: Good     Standing balance support: Bilateral upper extremity supported;During functional activity Standing balance-Leahy Scale: Fair Standing balance comment: c/o dizziness in standing                   ADL Overall ADL's : Needs assistance/impaired     Grooming: Min guard;Standing Grooming Details (indicate cue type and reason): stood to complete oral care                             Functional mobility during ADLs: Min guard;Rolling walker General ADL Comments: Pt wasa finishing up SPT to [facility] with aide at start of  session. Pt c/o dizziness which increased during transfer. After a short rest break pt stood to complete oral care standing beside the bed. Pt then ambulated to recliner at min guard level. Educated pt on enery conservation techniques and provided handout.       Vision                     Perception     Praxis      Cognition   Behavior During Therapy: WFL for tasks assessed/performed Overall Cognitive Status: Within Functional Limits for tasks assessed                       Extremity/Trunk Assessment               Exercises     Shoulder Instructions       General Comments      Pertinent Vitals/ Pain       Pain Assessment: C/o moderate abdominal pain  Home Living                                          Prior Functioning/Environment              Frequency Min 2X/week     Progress Toward Goals  OT Goals(current goals can now be found in the care plan section)  Progress towards OT goals: Progressing toward goals  Acute Rehab OT Goals Patient  Stated Goal: transition to ALF OT Goal Formulation: With patient Time For Goal Achievement: 08/13/14 Potential to Achieve Goals: Good ADL Goals Pt Will Perform Lower Body Dressing: with modified independence;with adaptive equipment;sit to/from stand Pt Will Transfer to Toilet: with modified independence;ambulating Pt Will Perform Toileting - Clothing Manipulation and hygiene: with modified independence;sit to/from stand Pt Will Perform Tub/Shower Transfer: Tub transfer;with min assist;ambulating;shower seat;rolling walker Additional ADL Goal #1: Pt will verbalize 3 energy conservation strategies for ADLs with handout provided.  Plan Discharge plan remains appropriate    Co-evaluation                 End of Session Equipment Utilized During Treatment: Gait belt;Rolling walker;Oxygen   Activity Tolerance Patient tolerated treatment well;Other (comment) (c/o dizziness)    Patient Left in chair;with call bell/phone within reach;with nursing/sitter in room   Nurse Communication      Functional Assessment Tool Used: clinical judgement Functional Limitation: Self care Self Care Current Status (T5573): At least 20 percent but less than 40 percent impaired, limited or restricted Self Care Goal Status (U2025): At least 1 percent but less than 20 percent impaired, limited or restricted   Time: 1129-1151 OT Time Calculation (min): 22 min  Charges: OT G-codes **NOT FOR INPATIENT CLASS** Functional Assessment Tool Used: clinical judgement Functional Limitation: Self care Self Care Current Status (K2706): At least 20 percent but less than 40 percent impaired, limited or restricted Self Care Goal Status (C3762): At least 1 percent but less than 20 percent impaired, limited or restricted OT General Charges $OT Visit: 1 Procedure OT Treatments $Self Care/Home Management : 8-22 mins  Hortencia Pilar 08/07/2014, 1:32 PM

## 2014-08-07 NOTE — Progress Notes (Signed)
  Date: 08/07/2014  Patient name: [name]  Medical record number: [id number]  Date of birth: 03/29/1950   This patient's plan of care was discussed with the house staff. Please see Dr. Serita Grit note for complete details. I concur with his findings.   Sid Falcon, MD 08/07/2014, 9:21 PM

## 2014-08-07 NOTE — Progress Notes (Addendum)
Subjective: Yesterday,  her sister [name] was contacted per the patient's preference to update her sister who is also the POA. Though her sister [name] is currently not why, she appreciated the update and reported to [place] that she fears for her sister's safety given how frail she is and how sick she is. She also reports that her sister struggles with basic day-to-day chores given her oxygen requirement and is concerned that she may not be safe alone at home.  This morning, she was asleep but arousable on exam. She continues to report abdominal pain as well as visual hallucinations which she describes as "shadows." She denies that the shadows are instructing her to harm herself or others.  We also spoke with her home health aide, [name], with the patient's permission, and she additionally corroborated the information collected yesterday. She feels she often finds the patient at home confused with bruises all over her skin, and upon asking the patient, she is unable to describe the events preceding the bruises. She feels this is within the patient's normal state of health for at least a month, and she also suffers from chronic abdominal pain that has recently worsened in the last several weeks. She is unsure when she last followed up with psychiatry is also concerned that her audiovisual hallucinations may pose a threat to her personal safety.   Objective: Vital signs in last 24 hours: Filed Vitals:   08/06/14 2350 08/07/14 0557 08/07/14 0733 08/07/14 0735  BP: 135/65 159/68    Pulse: 77 76    Temp: 99 F (37.2 C) 97.9 F (36.6 C)    TempSrc: Oral Axillary    Resp:      Weight:      SpO2: 95% 90% 98% 98%   Weight change:   Intake/Output Summary (Last 24 hours) at 08/07/14 1310 Last data filed at 08/07/14 1046  Gross per 24 hour  Intake   1080 ml  Output   2100 ml  Net  -1020 ml   General: Elderly-appearing cachectic Caucasian female, sitting in bed, 3.5 L O2 by nasal cannula HEENT:  PERRL, EOMI, no scleral icterus, oropharynx clear Cardiac: RRR, no rubs, murmurs or gallops Pulm: clear to auscultation bilaterally in the posterior lung fields though poor air movement, mild moderate wheezing audible as well Abd: soft, tender in the right upper quadrant and epigastric areas, distended, BS present Ext: warm and well perfused, no pedal or tibial edema Neuro: Moving all extremities spontaneously, responds to questions appropriately, oriented to name, place, time, date of the week, visual hallucinations ["shadows"]   Lab Results: Basic Metabolic Panel:  Recent Labs Lab 08/06/14 0524 08/07/14 0335  NA 138 144  K 3.5 3.3*  CL 93* 99*  CO2 35* 34*  GLUCOSE 70 78  BUN 6 6  CREATININE 0.31* <0.30*  CALCIUM 7.8* 7.7*  MG 1.7  --    Liver Function Tests:  Recent Labs Lab 08/05/14 0933  AST 25  ALT 18  ALKPHOS 76  BILITOT 0.5  PROT 5.8*  ALBUMIN 3.2*   CBC:  Recent Labs Lab 08/05/14 0933 08/06/14 0524 08/07/14 0335  WBC 4.7 4.5 5.2  NEUTROABS 2.9  --   --   HGB 9.8* 8.8* 9.1*  HCT 32.3* 28.3* 29.0*  MCV 101.6* 99.3 101.8*  PLT 463* 456* 471*   Alcohol Level:  Recent Labs Lab 08/05/14 1946  ETH <5   Studies/Results: No results found. Medications: I have reviewed the patient's current medications. Scheduled Meds: .  acetaminophen  650 mg Oral Once  . budesonide  0.5 mg Nebulization QID  . DULoxetine  30 mg Oral BID  . enoxaparin (LOVENOX) injection  40 mg Subcutaneous Q24H  . fluticasone  1 spray Each Nare Daily  . ipratropium-albuterol  3 mL Nebulization QID  . lamoTRIgine  100 mg Oral Daily  . loratadine  10 mg Oral Daily  . magnesium citrate  1 Bottle Oral Once  . mometasone-formoterol  2 puff Inhalation BID  . OLANZapine  5 mg Oral QHS  . pantoprazole  40 mg Oral BID  . polyethylene glycol  17 g Oral BID  . senna-docusate  2 tablet Oral BID  . sodium chloride  3 mL Intravenous Q12H   Continuous Infusions:  PRN Meds:.acetaminophen,  diphenhydrAMINE, guaiFENesin, ondansetron, ondansetron, zolpidem Assessment/Plan:  [name] is a 64 year old female with COPD, iron deficiency anemia, chronic pain, allergic rhinitis, bipolar disorder hospitalized for altered mental status and right upper quadrant abdominal pain found to have mild hypocalcemia now with visual hallucinations.  Altered mental status complicated by visual hallucinations : Possibly secondary to poorly controlled psychiatric illness. TSH reassuring, and other metabolic causes at this point appear less likely, especially given history provided by home health aide who has seen her daily since it appears her confusion and presumed falls have been happening for at least a month, prior to the change in her oxygen therapy. No changes in her electrolytes to also account for the symptoms. -Consult psychiatry for additional management -Continue sitter for now  Abdominal pain:  Possibly secondary to constipation though GERD or gastritis cannot be excluded. No bowel movement since admission Hepatobiliary causes were considered at the time of admission and appear unlikely at this point. -Start Senokot-S 2 tablets along with MiraLAX and consider magnesium citrate or enema per patient preference should she not have a bowel movement -Continue Protonix 40 mg twice daily  History of chronic diarrhea: Initially suspected to be C. difficile colitis given recent antibiotic use over the past month but she has not had any bowel movement since admission and thus unlikely at this point.   -Continue assessing her symptoms   Iron deficiency anemia: Hemoglobin 9.1 this morning, baseline 9-11 since January. She was on iron supplementation following her January hospitalization though reported nonadherence to therapy given GI side effects. -Check FOBT -Consider consulting GI for additional recommendations -Recheck CBC tomorrow  COPD: No signs of wheezing or worsening respiratory symptoms as her  cough appears to be chronic. Per chart review, it appears she never followed up to have any of her outpatient PFTs performed, dating as far back as 2012. Home medications include Advair 1 puff twice daily, DuoNeb's 4 times daily, albuterol 1 puff 2-4 hours as needed for wheezing. -Continue Dulera 2 puffs twice daily instead of Advair along with the remainder of her home medications  Chronic pain: Per [place] DEA database, she recently filled oxycodone 5 mg 30 tablets on 07/29/14. She is also on Neurontin 300 mg 3 times daily. -Hold pending resolution of mental status and gastric symptoms  Hypocalcemia: Corrected calcium 8.5 on admission, stable from 2 weeks ago.  -Consider calcium supplementation pending improvement  in her gastric symptoms   Bipolar disorder: Her medications include Cymbalta 30 mg twice daily, Lamictal 100 mg daily, Zyprexa 5 mg daily at bedtime. -Continue her home medications  Allergic rhinitis: Her medications include Zyrtec 10 mg daily, Flonase 1 spray in both nostrils daily, Benadryl 25 mg as needed for itching. -Continue home medications  #  FEN:  -Diet: Heart healthy  #DVT prophylaxis: Lovenox  #CODE STATUS: DNR/DNI -Defer to sister [name] [phone] if patients lacks decision-making capacity -Confirmed with patient on admission  Dispo: Disposition is deferred at this time, awaiting improvement of current medical problems.    The patient does have a current PCP (Rushil Sherrye Payor, MD) and does need an [facility] hospital follow-up appointment after discharge.  The patient does have transportation limitations that hinder transportation to clinic appointments.  .Services Needed at time of discharge: Y = Yes, Blank = No PT:   OT:   RN:   Equipment:   Other:       Riccardo Dubin, MD 08/07/2014, 1:10 PM

## 2014-08-07 NOTE — Progress Notes (Signed)
Warm pack helped for a very short time, then nothing.  Patient is looking forward to having Oxycodone for pain.

## 2014-08-08 ENCOUNTER — Encounter (HOSPITAL_COMMUNITY): Payer: Self-pay | Admitting: General Practice

## 2014-08-08 DIAGNOSIS — F3130 Bipolar disorder, current episode depressed, mild or moderate severity, unspecified: Secondary | ICD-10-CM | POA: Diagnosis not present

## 2014-08-08 DIAGNOSIS — D539 Nutritional anemia, unspecified: Secondary | ICD-10-CM | POA: Diagnosis not present

## 2014-08-08 DIAGNOSIS — R1011 Right upper quadrant pain: Secondary | ICD-10-CM | POA: Diagnosis not present

## 2014-08-08 DIAGNOSIS — K529 Noninfective gastroenteritis and colitis, unspecified: Secondary | ICD-10-CM | POA: Diagnosis not present

## 2014-08-08 DIAGNOSIS — F3164 Bipolar disorder, current episode mixed, severe, with psychotic features: Secondary | ICD-10-CM | POA: Diagnosis not present

## 2014-08-08 DIAGNOSIS — R4182 Altered mental status, unspecified: Secondary | ICD-10-CM | POA: Diagnosis not present

## 2014-08-08 DIAGNOSIS — D509 Iron deficiency anemia, unspecified: Secondary | ICD-10-CM | POA: Diagnosis not present

## 2014-08-08 LAB — CBC
HCT: 30.9 % — ABNORMAL LOW (ref 36.0–46.0)
Hemoglobin: 9.4 g/dL — ABNORMAL LOW (ref 12.0–15.0)
MCH: 30.8 pg (ref 26.0–34.0)
MCHC: 30.4 g/dL (ref 30.0–36.0)
MCV: 101.3 fL — ABNORMAL HIGH (ref 78.0–100.0)
PLATELETS: 517 10*3/uL — AB (ref 150–400)
RBC: 3.05 MIL/uL — ABNORMAL LOW (ref 3.87–5.11)
RDW: 17 % — ABNORMAL HIGH (ref 11.5–15.5)
WBC: 4.7 10*3/uL (ref 4.0–10.5)

## 2014-08-08 LAB — BASIC METABOLIC PANEL
Anion gap: 9 (ref 5–15)
BUN: <5 mg/dL — ABNORMAL LOW (ref 6–20)
CO2: 37 mmol/L — AB (ref 22–32)
CREATININE: 0.31 mg/dL — AB (ref 0.44–1.00)
Calcium: 7.7 mg/dL — ABNORMAL LOW (ref 8.9–10.3)
Chloride: 96 mmol/L — ABNORMAL LOW (ref 101–111)
GFR calc Af Amer: >60 mL/min (ref >60)
GFR calc non Af Amer: >60 mL/min (ref >60)
Glucose, Bld: 89 mg/dL (ref 65–99)
Potassium: 3.9 mmol/L (ref 3.5–5.1)
Sodium: 142 mmol/L (ref 135–145)

## 2014-08-08 LAB — OCCULT BLOOD X 1 CARD TO LAB, STOOL: Fecal Occult Bld: NEGATIVE

## 2014-08-08 MED ORDER — OXYCODONE HCL 5 MG PO TABS
5.0000 mg | ORAL_TABLET | Freq: Three times a day (TID) | ORAL | Status: DC | PRN
  Administered 2014-08-08 – 2014-08-12 (×10): 5 mg via ORAL
  Filled 2014-08-08 (×10): qty 1

## 2014-08-08 NOTE — Progress Notes (Signed)
  Date: 08/08/2014  Patient name: [name]  Medical record number: [id number]  Date of birth: August 28, 1950   This patient's plan of care was discussed with the house staff. Please see Dr. Serita Grit note for complete details. I concur with his findings.  [name] is improving.  Based on conversation with her Aide at home and sister, I am concerned that it may not be safe for her to be discharged home.  I think she may have too many sedating medications at home which is causing her to be confused, sedated.  She also had a rising CO2 and we have decreased her O2 to 3LNC.  She had a BM yesterday, but has persistent abdominal pain.  I am concerned for possible mild opiate withdrawal symptoms (cramping) given the persistence of her symptoms.  I have started back her oxycodone at half home dose (nursing reports oxycodone ER 15mg  twice a day, however, her med list noted an IR prescription.  She is receiving this from a pain clinic).  Conversation with her pain management doctor should be had by her PCP about decreasing her sedating medications.  PT has reevaluated and recommended SNF, which I think appropriate at this juncture.  Reassuringly, FOBT is negative.     Sid Falcon, MD 08/08/2014, 2:42 PM

## 2014-08-08 NOTE — Progress Notes (Signed)
Physical Therapy Treatment Patient Details Name: [name] MRN: [id number] DOB: [birth year] Today's Date: 08/08/2014    History of Present Illness [name] is a 64 year old female with COPD, iron deficiency anemia, chronic pain, allergic rhinitis, bipolar disorder Mcclure presented with confusion and abdominal pain..    Jordan Comments    Jordan called for re-evaluation. Patient continues to demonstrates deficits in activity tolerance and mobility. Patient expressed concerns regarding transition to management of new (larger) oxygen tank and decreased caregiver support. During mobility, patient did required assist with O2 line management and min guard to close supervision with minimal ambulation. Patient currently requiring some assist with higher level balance tasks and is ambulating with significantly decreased gait speed thus increasing the patients fall risk.  Patient with known history of multiple admissions over the past year and per MD reports, current home health aid also expressing concern over patient ability for self care.  Feel that patient would benefit from [facility] to address balance deficits, activity tolerance, and improve overall functional mobility to maximize independence for safe d/c home. Will continue to see as indicated and progress as tolerated. Discussed recommendation with [name] is in agreement with POC.   Follow Up Recommendations  SNF;Supervision/Assistance - 24 hour     Equipment Recommendations  None recommended by Jordan    Recommendations for Other Services       Precautions / Restrictions Precautions Precautions: Fall    Mobility  Bed Mobility               General bed mobility comments: not assessed  Transfers Overall transfer level: Needs assistance Equipment used: Rolling walker (2 wheeled) Transfers: Sit to/from Stand Sit to Stand: Supervision         General transfer comment: Vcs for hand placement, increased time to elevate to  standing  Ambulation/Gait Ambulation/Gait assistance: Supervision;Min guard Ambulation Distance (Feet): 40 Feet Assistive device: Rolling walker (2 wheeled) Gait Pattern/deviations: Step-through pattern;Decreased stride length Gait velocity: decreased Gait velocity interpretation: Below normal speed for age/gender General Gait Details: limited by generalized weakness and decreased activity tolerance (assist to manage O2 line during ambulation)   Stairs            Wheelchair Mobility    Modified Rankin (Stroke Patients Only)       Balance Overall balance assessment: Needs assistance;History of Falls Sitting-balance support: No upper extremity supported Sitting balance-Leahy Scale: Good     Standing balance support: Bilateral upper extremity supported Standing balance-Leahy Scale: Fair Standing balance comment: reliance on RW for stability in static standing             High level balance activites: Direction changes;Turns High Level Balance Comments: some instability noted, assist provided for management of O2 lines    Cognition Arousal/Alertness: Awake/alert Behavior During Therapy: WFL for tasks assessed/performed Overall Cognitive Status: Within Functional Limits for tasks assessed (Patient reports slow processing with higher level cognition)                      Exercises      General Comments        Pertinent Vitals/Pain Pain Assessment: No/denies pain    Home Living Family/patient expects to be discharged to:: Private residence Living Arrangements: Alone                  Prior Function            Jordan Goals (current goals can now be found in the  care plan section) Acute Rehab Jordan Goals Patient Stated Goal: transition to ALF Jordan Goal Formulation: With patient Time For Goal Achievement: 08/13/14 Potential to Achieve Goals: Good Progress towards Jordan goals: Progressing toward goals (modest progression)    Frequency  Min  3X/week    Jordan Plan Discharge plan needs to be updated    Co-evaluation             End of Session Equipment Utilized During Treatment: Oxygen (4LO2 via [place]) Activity Tolerance: Patient limited by fatigue Patient left: in chair;with call bell/phone within reach     Time: 1771-1657 Jordan Time Calculation (min) (ACUTE ONLY): 19 min  Charges:  $Therapeutic Activity: 8-22 mins                    G CodesDuncan Dull 2014-08-17, 2:24 PM Alben Deeds, [place] DPT  [phone]

## 2014-08-08 NOTE — Consult Note (Signed)
Psychiatry Consult follow-up  Reason for Consult:  Bipolar disorder medication management Referring Physician:  Dr. Anderson Malta. Posey Pronto Patient Identification: [name] MRN:  [id number] Principal Diagnosis: Bipolar I disorder, most recent episode depressed Diagnosis:   Patient Active Problem List   Diagnosis Date Noted  . SOB (shortness of breath) [R06.02] 08/05/2014  . Abdominal pain [R10.9] 08/05/2014  . Diarrhea [R19.7] 08/05/2014  . Acute encephalopathy [G93.40] 08/05/2014  . Acute respiratory failure with hypoxia and hypercarbia [J96.01] 08/05/2014  . Generalized abdominal pain [R10.84] 07/17/2014  . Chronic respiratory failure [J96.10] 07/03/2014  . Acute diarrhea [R19.7] 05/01/2014  . Iron deficiency anemia [D50.9] 11/12/2013  . COPD exacerbation [J44.1] 05/14/2013  . Health care maintenance [Z00.00] 05/14/2013  . Nausea and vomiting [R11.2] 04/17/2013  . Insomnia [G47.00] 03/06/2013  . GERD (gastroesophageal reflux disease) [K21.9] 03/06/2013  . Protein-calorie malnutrition, severe [E43] 03/04/2013  . Chronic pain [G89.29] 03/02/2013  . Dyslipidemia [E78.5] 02/26/2013  . Loss of weight [R63.4] 08/08/2012  . Seasonal allergies [J30.2] 07/09/2012  . 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 Bronchitis, allergic [J45.909] 06/22/2010  . NARCOTIC ABUSE [IMO0002] 03/25/2010  . OSTEOPOROSIS [M81.0] 06/17/2009  . Bipolar I disorder, most recent episode depressed [F31.30] 06/02/2009  . DEFICIENCY, VITAMIN D NOS [E55.9] 01/03/2007  . DEPRESSION [F32.9] 12/16/2005  . BACK PAIN, LUMBAR [M54.5] 12/07/2005    Total Time spent with patient: 30 minutes  Subjective:   [name] is a 64 y.o. female patient admitted with confusion and abdominal pain.. .  HPI:  [name] he is a 64 years old female admitted to [facility] with multiple medical problems and also increased visual hallucinations , confusion and abdominal  pain. Psychiatric consultation requested for medication management of bipolar disorder. Patient reported she has been suffering with bipolar disorder over several years and has been receiving outpatient medication management from [facility].  Patient stated that new program [place] has stop taking her health insurance and not providing services. Patient stated she is having hard time to find a new provider even though trying to call several phone numbers. Patient also reported she get medication delivered to her home and she receives transportation from the insurance company probably  [employer] from [employer].  Patient has been living individual on her family members are everywhere else. She has a friend who has been taking her to the local grocery stores. Patient indicated she has been seeing black   Human shadows coming out of the every corner  Both day and night which is making her more scary and unable to sleep.  Reportedly patient was recently received tapered dose of  prednisolone medication for COPD. Patient reportedly has no changes in her psychiatric medication in the recent past. Patient reportedly taking her medication as prescribed. Patient denied drug of abuse.  Reportedly she smoking 2 cigarettes a day from 2 packs per day.  Interval history: Patient seen for psychiatric consultation and evaluation follow-up today. Patient appeared sitting in a chair next to her bed, she is awake, alert, oriented to time place person and situation patient stated she was taken medication as prescribed and seems to be helping and has no reported adverse affects. Patient would like to continue her medication and hoping that will be more helpful to stabilize her mood and anxiety. Patient is also happy to get the help from psychiatric social service to find a new mental health provider who accept her insurance. Patient  denied any disturbance of sleep and appetite.  Patient contract for safety and no safety issues at this time.  Past Medical History:  Past Medical History  Diagnosis Date  . Depression   . Suicidal ideation 2007     attempted overdose 2012  Dr. Tivis Ringer report  . Bipolar disorder   . Substance abuse      narcotics, alcohol, tobacco  . Chronic pain syndrome     follows at [facility] pain [facility]  . DEFICIENCY, VITAMIN D NOS 01/03/2007  . Chronic lower back pain   . Anxiety   . OSTEOPOROSIS 06/17/2009    DEXA 05/2009 : L femur -2.9; R femur -2.5. Alendronate on med list but not taking. Needs addressed ASAP as h/o fractures.    . Elevated liver function tests   . On home oxygen therapy     "3L; 24/7" (03/20/2014)  . Shortness of breath     "all the time" (02/27/2013)  . 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 Sciatic pain   . Anemia   . Pneumonia X 1?  . Hyperthyroidism     "borderline"  . History of blood transfusion     "getting my 1st later tonight" (03/20/2014)  . Arthritis     "legs, back; hips, primarily left hip" (03/20/2014)    Past Surgical History  Procedure Laterality Date  . Orif ankle fracture Left 10/2010  . Cesarean section  1974; 1979  . Tubal ligation  1979  . Augmentation mammaplasty  ~ 2007  . Fracture surgery    . Ankle debridement Left 10/2010   Family History:  Family History  Problem Relation Age of Onset  . Stroke Neg Hx   . Cancer Neg Hx   . Myelodysplastic syndrome Father     Died from myelofibrosis though diagnosed post-mortem   Social History:  History  Alcohol Use  . 0.0 oz/week  . 0 Standard drinks or equivalent per week    Comment: 03/20/2014 "nothing in 7 years; never had a problem w/it"     History  Drug Use No    History   Social History  . Marital Status: Divorced    Spouse Name: N/A  . Number of Children: 2  . Years of Education: N/A   Occupational History  . retired    Social History Main Topics  . Smoking status:  Current Every Day Smoker -- 0.20 packs/day for 45 years    Types: Cigarettes  . Smokeless tobacco: Never Used     Comment: 03/20/2014 "smoking 2 cigarettes/day; that's down from 2 ppd"  . Alcohol Use: 0.0 oz/week    0 Standard drinks or equivalent per week     Comment: 03/20/2014 "nothing in 7 years; never had a problem w/it"  . Drug Use: No  . Sexual Activity: No   Other Topics Concern  . 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  She has been living alone and has a friend since the neighborhood. She continued to smoke  tobacco with limited.                          Allergies:   Allergies  Allergen Reactions  . Banana Shortness Of Breath and Nausea And Vomiting  . Lyrica [Pregabalin]     "Disorients me"  . Pollen Extract Other (See Comments)    Seasonal     Labs:  Results for orders placed or performed during the hospital encounter of 08/05/14 (from the past 48 hour(s))  Urine rapid drug screen (hosp performed)     Status: None   Collection Time: 08/06/14  8:45 PM  Result Value Ref Range   Opiates NONE DETECTED NONE DETECTED   Cocaine NONE DETECTED NONE DETECTED   Benzodiazepines NONE DETECTED NONE DETECTED   Amphetamines NONE DETECTED NONE DETECTED   Tetrahydrocannabinol NONE DETECTED NONE DETECTED   Barbiturates NONE DETECTED NONE DETECTED    Comment:        DRUG SCREEN FOR MEDICAL PURPOSES ONLY.  IF CONFIRMATION IS NEEDED FOR ANY PURPOSE, NOTIFY LAB WITHIN 5 DAYS.        LOWEST DETECTABLE LIMITS FOR URINE DRUG SCREEN Drug Class       Cutoff (ng/mL) Amphetamine       1000 Barbiturate      200 Benzodiazepine   170 Tricyclics       017 Opiates          300 Cocaine          300 THC              50   Basic metabolic panel     Status: Abnormal   Collection Time: 08/07/14  3:35 AM  Result Value Ref Range   Sodium 144 135 - 145 mmol/L   Potassium 3.3 (L) 3.5 - 5.1 mmol/L   Chloride 99 (L) 101 - 111 mmol/L   CO2 34 (H) 22 - 32 mmol/L   Glucose, Bld 78 65 - 99 mg/dL   BUN 6 6 - 20 mg/dL   Creatinine, Ser <0.30 (L) 0.44 - 1.00 mg/dL   Calcium 7.7 (L) 8.9 - 10.3 mg/dL   GFR calc non Af Amer NOT CALCULATED >60 mL/min   GFR calc Af Amer NOT CALCULATED >60 mL/min    Comment: (NOTE) The eGFR has been calculated using the CKD EPI equation. This calculation has not been validated in all clinical situations. eGFR's persistently <60 mL/min signify possible Chronic Kidney Disease.    Anion gap 11 5 - 15  CBC     Status: Abnormal   Collection Time: 08/07/14  3:35 AM  Result Value Ref Range   WBC 5.2 4.0 - 10.5 K/uL   RBC 2.85 (L) 3.87 - 5.11 MIL/uL   Hemoglobin 9.1 (L) 12.0 - 15.0 g/dL   HCT 29.0 (L) 36.0 - 46.0 %   MCV 101.8 (H) 78.0 - 100.0 fL   MCH 31.9 26.0 - 34.0 pg   MCHC 31.4 30.0 - 36.0 g/dL   RDW 16.9 (H) 11.5 - 15.5 %   Platelets 471 (H) 150 - 400 K/uL  TSH     Status: None   Collection Time: 08/07/14  3:35 AM  Result Value Ref Range   TSH 3.929 0.350 - 4.500 uIU/mL  Basic metabolic panel     Status: Abnormal   Collection Time: 08/08/14  3:42 AM  Result Value Ref Range   Sodium 142 135 - 145 mmol/L   Potassium 3.9 3.5 - 5.1  mmol/L   Chloride 96 (L) 101 - 111 mmol/L   CO2 37 (H) 22 - 32 mmol/L   Glucose, Bld 89 65 - 99 mg/dL   BUN <5 (L) 6 - 20 mg/dL   Creatinine, Ser 0.31 (L) 0.44 - 1.00 mg/dL   Calcium 7.7 (L) 8.9 - 10.3 mg/dL   GFR calc non Af Amer >60 >60 mL/min   GFR calc Af Amer >60 >60 mL/min    Comment: (NOTE) The eGFR has been calculated using the CKD EPI equation. This calculation has not been validated in all clinical  situations. eGFR's persistently <60 mL/min signify possible Chronic Kidney Disease.    Anion gap 9 5 - 15  CBC     Status: Abnormal   Collection Time: 08/08/14  3:42 AM  Result Value Ref Range   WBC 4.7 4.0 - 10.5 K/uL   RBC 3.05 (L) 3.87 - 5.11 MIL/uL   Hemoglobin 9.4 (L) 12.0 - 15.0 g/dL   HCT 30.9 (L) 36.0 - 46.0 %   MCV 101.3 (H) 78.0 - 100.0 fL   MCH 30.8 26.0 - 34.0 pg   MCHC 30.4 30.0 - 36.0 g/dL   RDW 17.0 (H) 11.5 - 15.5 %   Platelets 517 (H) 150 - 400 K/uL  Occult blood card to lab, stool RN will collect     Status: None   Collection Time: 08/08/14  5:26 AM  Result Value Ref Range   Fecal Occult Bld NEGATIVE NEGATIVE    Vitals: Blood pressure 152/68, pulse 73, temperature 98.8 F (37.1 C), temperature source Oral, resp. rate 17, weight 41.958 kg (92 lb 8 oz), SpO2 97 %.  Risk to Self: Is patient at risk for suicide?: No Risk to Others:   Prior Inpatient Therapy:   Prior Outpatient Therapy:    Current Facility-Administered Medications  Medication Dose Route Frequency Provider Last Rate Last Dose  . acetaminophen (TYLENOL) tablet 650 mg  650 mg Oral Q6H PRN Cresenciano Genre, MD   650 mg at 08/08/14 0548  . ARIPiprazole (ABILIFY) tablet 5 mg  5 mg Oral BID Ambrose Finland, MD   5 mg at 08/08/14 0744  . budesonide (PULMICORT) nebulizer solution 0.5 mg  0.5 mg Nebulization QID Cresenciano Genre, MD   0.5 mg at 08/08/14 0942  . diphenhydrAMINE (BENADRYL) tablet 25 mg  25 mg Oral PRN Cresenciano Genre, MD      . enoxaparin (LOVENOX) injection 40 mg  40 mg Subcutaneous Q24H Cresenciano Genre, MD   40 mg at 08/07/14 1834  . fluticasone (FLONASE) 50 MCG/ACT nasal spray 1 spray  1 spray Each Nare Daily Cresenciano Genre, MD   1 spray at 08/07/14 1018  . gabapentin (NEURONTIN) capsule 300 mg  300 mg Oral TID Ambrose Finland, MD   300 mg at 08/07/14 2017  . guaiFENesin (MUCINEX) 12 hr tablet 600 mg  600 mg Oral BID PRN Cresenciano Genre, MD   600 mg at 08/05/14 1708  .  ipratropium-albuterol (DUONEB) 0.5-2.5 (3) MG/3ML nebulizer solution 3 mL  3 mL Nebulization QID Cresenciano Genre, MD   3 mL at 08/08/14 0615  . lamoTRIgine (LAMICTAL) tablet 100 mg  100 mg Oral Daily Cresenciano Genre, MD   100 mg at 08/07/14 1018  . loratadine (CLARITIN) tablet 10 mg  10 mg Oral Daily Cresenciano Genre, MD   10 mg at 08/07/14 1017  . mometasone-formoterol (DULERA) 100-5 MCG/ACT inhaler 2 puff  2 puff Inhalation BID  Cresenciano Genre, MD   2 puff at 08/08/14 [phone]  . ondansetron (ZOFRAN) injection 4 mg  4 mg Intravenous Q6H PRN Cresenciano Genre, MD      . ondansetron (ZOFRAN-ODT) disintegrating tablet 4 mg  4 mg Oral Q8H PRN Cresenciano Genre, MD      . pantoprazole (PROTONIX) EC tablet 40 mg  40 mg Oral BID Cresenciano Genre, MD   40 mg at 08/07/14 2017  . polyethylene glycol (MIRALAX / GLYCOLAX) packet 17 g  17 g Oral BID Riccardo Dubin, MD   17 g at 08/07/14 2017  . senna-docusate (Senokot-S) tablet 2 tablet  2 tablet Oral BID Riccardo Dubin, MD   2 tablet at 08/07/14 2016  . sodium chloride 0.9 % injection 3 mL  3 mL Intravenous Q12H Cresenciano Genre, MD   3 mL at 08/05/14 2128  . zolpidem (AMBIEN) tablet 5 mg  5 mg Oral QHS PRN Cresenciano Genre, MD   5 mg at 08/07/14 2140    Musculoskeletal: Strength & Muscle Tone: decreased Gait & Station: unable to stand Patient leans: N/A  Psychiatric Specialty Exam: Physical Exam   ROS    Blood pressure 152/68, pulse 73, temperature 98.8 F (37.1 C), temperature source Oral, resp. rate 17, weight 41.958 kg (92 lb 8 oz), SpO2 97 %.Body mass index is 18.07 kg/(m^2).  General Appearance: Guarded  Eye Contact::  Good  Speech:  Clear and Coherent and Slow  Volume:  Decreased  Mood:  Anxious and Depressed  Affect:  Appropriate, Congruent and Depressed  Thought Process:  Coherent and Goal Directed  Orientation:  Full (Time, Place, and Person)  Thought Content:  WDL  Suicidal Thoughts:  No  Homicidal Thoughts:  No  Memory:  Immediate;   Good Recent;    Good  Judgement:  Fair  Insight:  Good  Psychomotor Activity:  Decreased  Concentration:  Fair  Recall:  [place] of Knowledge:Good  Language: Good  Akathisia:  NA  Handed:  Right  AIMS (if indicated):     Assets:  Communication Skills Desire for Improvement Financial Resources/Insurance Housing Leisure Time Resilience Social Support Transportation  ADL's:  Impaired  Cognition: WNL  Sleep:      Medical Decision Making: Review of Psycho-Social Stressors (1), Review or order clinical lab tests (1), Established Problem, Worsening (2), Review or order medicine tests (1), Review of Medication Regimen & Side Effects (2) and Review of New Medication or Change in Dosage (2)  Treatment Plan Summary:  Patient has been suffering with significant mood swings , depression , anxiety , worry about finding a mental health provider to continue her medication management for bipolar depression which is suffering over several years. Patient has been contracting for safety and has no suicidal or homicidal ideation. Patient has been smoking 2 cigarettes a day from 2 packs a day and required oxygen by cannula for COPD. Patient has been posteriorly responding to her current medication management on happy to find a new provider upon discharge Daily contact with patient to assess and evaluate symptoms and progress in treatment and Medication management  Plan:  Psychiatric social service reported patient will be referred to the outpatient psychiatric services who accept her health insurance.  Provided counseling for tobacco cessation during this evaluation Continue Abilify 5 mg twice daily for psychosis Continue Neurontin 300 mg 3 times a day for anxiety Continue Lamictal  100 mg daily for more swings Insomnia : Ambien 5 mg  at bedtime as needed  Patient does not meet criteria for psychiatric inpatient admission. Supportive therapy provided about ongoing stressors. .  Appreciate psychiatric consultation  and we sign off at this time Please contact [phone] or [phone] if needs further assistance   Disposition:  Patient will be referred to the psychiatric social service to find new outpatient psychiatric medication management as her previous mental health provider not accepting her health insurance at this time  Hillel Card,JANARDHAHA R. 08/08/2014 9:43 AM

## 2014-08-08 NOTE — Progress Notes (Addendum)
Subjective: She was able to have a large BM following Soap Suds enema yesterday afternoon. This AM, she reports still seeing shadows though feels her confusion has improved. She still reports her stomach is distended and sore though does not feel her appetite has changed.   Objective: Vital signs in last 24 hours: Filed Vitals:   08/07/14 1301 08/07/14 1355 08/07/14 2210 08/08/14 0517  BP:  153/69 128/51 152/68  Pulse:  76 74 73  Temp:  97.9 F (36.6 C) 98.4 F (36.9 C) 98.8 F (37.1 C)  TempSrc:  Oral Oral Oral  Resp:  16 19 17   Weight:    92 lb 8 oz (41.958 kg)  SpO2: 97% 96% 96% 97%   Weight change:   Intake/Output Summary (Last 24 hours) at 08/08/14 1142 Last data filed at 08/08/14 0900  Gross per 24 hour  Intake    600 ml  Output      0 ml  Net    600 ml   General: Elderly-appearing cachectic Caucasian female, sitting in bed, 3 L O2 by nasal cannula HEENT: PERRL, EOMI, no scleral icterus, oropharynx clear Cardiac: RRR, no rubs, murmurs or gallops Pulm: clear to auscultation bilaterally in the posterior lung fields though poor air movement, mild moderate wheezing audible as well Abd: soft, generalized tenderness, distended, BS present Ext: warm and well perfused, no pedal or tibial edema Neuro: Moving all extremities spontaneously, responds to questions appropriately, oriented to name, place, time, date of the week, visual hallucinations ["shadows"]   Lab Results: Basic Metabolic Panel:  Recent Labs Lab 08/06/14 0524 08/07/14 0335 08/08/14 0342  NA 138 144 142  K 3.5 3.3* 3.9  CL 93* 99* 96*  CO2 35* 34* 37*  GLUCOSE 70 78 89  BUN 6 6 <5*  CREATININE 0.31* <0.30* 0.31*  CALCIUM 7.8* 7.7* 7.7*  MG 1.7  --   --    Liver Function Tests:  Recent Labs Lab 08/05/14 0933  AST 25  ALT 18  ALKPHOS 76  BILITOT 0.5  PROT 5.8*  ALBUMIN 3.2*   CBC:  Recent Labs Lab 08/05/14 0933  08/07/14 0335 08/08/14 0342  WBC 4.7  < > 5.2 4.7  NEUTROABS 2.9  --    --   --   HGB 9.8*  < > 9.1* 9.4*  HCT 32.3*  < > 29.0* 30.9*  MCV 101.6*  < > 101.8* 101.3*  PLT 463*  < > 471* 517*  < > = values in this interval not displayed. Alcohol Level:  Recent Labs Lab 08/05/14 1946  [place] <5   Studies/Results: No results found. Medications: I have reviewed the patient's current medications. Scheduled Meds: . ARIPiprazole  5 mg Oral BID  . budesonide  0.5 mg Nebulization QID  . enoxaparin (LOVENOX) injection  40 mg Subcutaneous Q24H  . fluticasone  1 spray Each Nare Daily  . gabapentin  300 mg Oral TID  . ipratropium-albuterol  3 mL Nebulization QID  . lamoTRIgine  100 mg Oral Daily  . loratadine  10 mg Oral Daily  . mometasone-formoterol  2 puff Inhalation BID  . pantoprazole  40 mg Oral BID  . polyethylene glycol  17 g Oral BID  . senna-docusate  2 tablet Oral BID  . sodium chloride  3 mL Intravenous Q12H   Continuous Infusions:  PRN Meds:.acetaminophen, diphenhydrAMINE, guaiFENesin, ondansetron, ondansetron, zolpidem Assessment/Plan:  [name] is a 64 year old female with COPD, iron deficiency anemia, chronic pain, allergic rhinitis, bipolar disorder hospitalized for  altered mental status and right upper quadrant abdominal pain found to have mild hypocalcemia and visual hallucinations.  Altered mental status complicated by visual hallucinations : Possibly secondary to poorly controlled psychiatric illness. Per home health aide, she has seen her daily since it appears her confusion and presumed falls have been happening for at least a month, prior to the change in her oxygen therapy. She was started on Abilify yesterday with discontinuation of Cymbalta and Zyprexa. -Discontinue sitter for now -Psychiatry following, appreciate recommendations  Acute on chronic abdominal pain:  Possibly secondary to constipation though GERD or gastritis cannot be excluded. Bowel movement yesterday resulted in some improvement in her symptoms that she did have some  distention and soreness on palpation this morning. -Continue Senokot-S 2 tablets along with MiraLAX -Continue Protonix 40 mg twice daily  History of chronic diarrhea: Initially suspected to be C. difficile colitis given recent antibiotic use over the past month but she has not had any bowel movement since admission and thus unlikely at this point.   -Continue assessing her symptoms   Macrocytic anemia: Hemoglobin 9.1 this morning, baseline 9-11 since January though MCV now stable at >100 since admission. She was on iron supplementation following her January hospitalization though reported nonadherence to therapy given GI side effects. FOBT negative. -Check B12/folate which could also be contributory to her mental status  COPD: No signs of wheezing or worsening respiratory symptoms as her cough appears to be chronic. Per chart review, it appears she never followed up to have any of her outpatient PFTs performed, dating as far back as 2012. Home medications include Advair 1 puff twice daily, DuoNeb's 4 times daily, albuterol 1 puff 2-4 hours as needed for wheezing. -Continue Dulera 2 puffs twice daily instead of Advair along with the remainder of her home medications  Chronic pain: Per [place] DEA database, she recently filled oxycodone 5 mg 30 tablets on 07/29/14. She is also on Neurontin 300 mg 3 times daily. -Continue Neurontin -Hold pending resolution of mental status and gastric symptoms  Hypocalcemia: Corrected calcium 8.5 on admission, stable from 2 weeks ago.  -Consider calcium supplementation pending improvement  in her gastric symptoms   Bipolar disorder: Her medications include Cymbalta 30 mg twice daily, Lamictal 100 mg daily, Zyprexa 5 mg daily at bedtime. -Abilify as noted above  Allergic rhinitis: Her medications include Zyrtec 10 mg daily, Flonase 1 spray in both nostrils daily, Benadryl 25 mg as needed for itching. -Continue home medications  #FEN:  -Diet: Heart healthy  #DVT  prophylaxis: Lovenox  #CODE STATUS: DNR/DNI -Defer to sister [name] [phone] if patients lacks decision-making capacity -Confirmed with patient on admission  Dispo: Disposition is deferred at this time, awaiting improvement of current medical problems.    The patient does have a current PCP (Rushil Sherrye Payor, MD) and does need an [facility] hospital follow-up appointment after discharge.  The patient does have transportation limitations that hinder transportation to clinic appointments.  .Services Needed at time of discharge: Y = Yes, Blank = No PT:   OT:   RN:   Equipment:   Other:       Riccardo Dubin, MD 08/08/2014, 11:42 AM

## 2014-08-08 NOTE — Clinical Social Work Psych Note (Addendum)
Psych CSW received consult for patient's follow-up for medication management once discharged.  Patient's payer source is  [facility] Medicare and Medicaid and patient cannot afford her $70 copay.  Patient was unable to receive follow-up care at [facility] due to insurance restrictions.  Psych CSW contacted [facility] [phone] who states they can assist patient with medication management and outpatient therapies billable by Medicaid.  Patient is aware of this referral and appointment set for Wednesday, June 1st at 1:15pm.  [facility] is located on [address] (on the bus route).  Patient is agreeable to this appointment and to follow-up as directed by the psychiatrist.  This information was placed on her discharge summary.  Patient expressed interest in Assisted Living (ALF).  Psych CSW explained to patient that PT is recommending home with home health and it is unclear if a receiving ALF will be located prior to MD's projected discharge.  After much thought, the patient states she would want to tour these facilities prior to making a decision and wishes to be discharged home with recommended health care at time of discharge.  Patient is agreeable to tour ALF facilities and begin ALF process if she so wishes.  Unit CSW updated regarding this conversation who is agreeable to updating RNCM.  Projected disposition: Home with [place], LCSW [phone]  Psychiatric & Orthopedics (5N 1-8) Clinical Social Worker

## 2014-08-09 DIAGNOSIS — R4182 Altered mental status, unspecified: Secondary | ICD-10-CM | POA: Diagnosis not present

## 2014-08-09 LAB — BASIC METABOLIC PANEL
ANION GAP: 7 (ref 5–15)
BUN: 8 mg/dL (ref 6–20)
CALCIUM: 8 mg/dL — AB (ref 8.9–10.3)
CHLORIDE: 97 mmol/L — AB (ref 101–111)
CO2: 37 mmol/L — ABNORMAL HIGH (ref 22–32)
Creatinine, Ser: 0.3 mg/dL — ABNORMAL LOW (ref 0.44–1.00)
GFR calc Af Amer: >60 mL/min (ref >60)
GFR calc non Af Amer: >60 mL/min (ref >60)
Glucose, Bld: 87 mg/dL (ref 65–99)
Potassium: 4.5 mmol/L (ref 3.5–5.1)
Sodium: 141 mmol/L (ref 135–145)

## 2014-08-09 LAB — VITAMIN B12: Vitamin B-12: 423 pg/mL (ref 180–914)

## 2014-08-09 NOTE — Progress Notes (Signed)
Subjective: This morning, she denies any complaints except for her abdominal pain. She reports that shadows are still present in the room and aren't intimidating but are not speaking or commanding her to hurt herself. She agrees that a skilled nursing facility would be in her best interest and does not mind having to wait until Tuesday.  Objective: Vital signs in last 24 hours: Filed Vitals:   08/08/14 2149 08/09/14 0615 08/09/14 0740 08/09/14 0900  BP:  133/76    Pulse: 81 69    Temp:  98.5 F (36.9 C)    TempSrc:  Oral    Resp: 18 17    Height:    5' (1.524 m)  Weight:  102 lb 3.2 oz (46.358 kg)  102 lb 3.2 oz (46.358 kg)  SpO2: 97% 99% 92%    Weight change: 9 lb 11.2 oz (4.4 kg)  Intake/Output Summary (Last 24 hours) at 08/09/14 1121 Last data filed at 08/09/14 0929  Gross per 24 hour  Intake   1400 ml  Output    450 ml  Net    950 ml   General: Elderly-appearing cachectic Caucasian female, sitting in bed, 3 L O2 by nasal cannula HEENT: PERRL, EOMI, no scleral icterus, oropharynx clear Cardiac: RRR, no rubs, murmurs or gallops Pulm: clear to auscultation bilaterally in the posterior lung fields though poor air movement, mild moderate wheezing audible as well Abd: soft, generalized tenderness, distended, BS present Ext: warm and well perfused, no pedal or tibial edema Neuro: Moving all extremities spontaneously, responds to questions appropriately, oriented to name, place, time, date of the week, visual hallucinations ["shadows"]   Lab Results: Basic Metabolic Panel:  Recent Labs Lab 08/06/14 0524  08/08/14 0342 08/09/14 0328  NA 138  < > 142 141  K 3.5  < > 3.9 4.5  CL 93*  < > 96* 97*  CO2 35*  < > 37* 37*  GLUCOSE 70  < > 89 87  BUN 6  < > <5* 8  CREATININE 0.31*  < > 0.31* 0.30*  CALCIUM 7.8*  < > 7.7* 8.0*  MG 1.7  --   --   --   < > = values in this interval not displayed. Liver Function Tests:  Recent Labs Lab 08/05/14 0933  AST 25  ALT 18    ALKPHOS 76  BILITOT 0.5  PROT 5.8*  ALBUMIN 3.2*   CBC:  Recent Labs Lab 08/05/14 0933  08/07/14 0335 08/08/14 0342  WBC 4.7  < > 5.2 4.7  NEUTROABS 2.9  --   --   --   HGB 9.8*  < > 9.1* 9.4*  HCT 32.3*  < > 29.0* 30.9*  MCV 101.6*  < > 101.8* 101.3*  PLT 463*  < > 471* 517*  < > = values in this interval not displayed. Alcohol Level:  Recent Labs Lab 08/05/14 1946  [place] <5   Studies/Results: No results found. Medications: I have reviewed the patient's current medications. Scheduled Meds: . ARIPiprazole  5 mg Oral BID  . budesonide  0.5 mg Nebulization QID  . enoxaparin (LOVENOX) injection  40 mg Subcutaneous Q24H  . fluticasone  1 spray Each Nare Daily  . gabapentin  300 mg Oral TID  . ipratropium-albuterol  3 mL Nebulization QID  . lamoTRIgine  100 mg Oral Daily  . loratadine  10 mg Oral Daily  . mometasone-formoterol  2 puff Inhalation BID  . pantoprazole  40 mg Oral BID  .  polyethylene glycol  17 g Oral BID  . senna-docusate  2 tablet Oral BID  . sodium chloride  3 mL Intravenous Q12H   Continuous Infusions:  PRN Meds:.acetaminophen, diphenhydrAMINE, guaiFENesin, ondansetron, ondansetron, oxyCODONE, zolpidem Assessment/Plan:  [name] is a 64 year old female with COPD, iron deficiency anemia, chronic pain, allergic rhinitis, bipolar disorder hospitalized for altered mental status and right upper quadrant abdominal pain found to have mild hypocalcemia and visual hallucinations.  Bipolar disorder complicated by visual hallucinations: Psychiatry recommended discontinuing Cymbalta and Zyprexa and start her on Abilify 5 mg twice daily along with continue her Lamictal 100 mg daily. She does have history of passive suicidal ideation from her hallucinations though is doing well without a sitter. -Continue Lamictal and Abilify  Acute on chronic abdominal pain:  Possibly secondary to constipation though GERD or gastritis cannot be excluded along with opioid  withdrawal. She was able to have a bowel movement yesterday which is reassuring. -Start Senokot-S 2 tablets along with MiraLAX -Continue Protonix 40 mg twice daily -Continue oxycodone 8 mg 3 times daily along with Tylenol 650 mg every 6 hours for pain  Altered mental status : Likely secondary to adverse effect of pain medications at home in the setting of her comorbid psychiatric illness. Per home health aide, she has seen her daily since it appears her confusion and presumed falls have been happening for at least a month, prior to the change in her oxygen therapy. -SNF placement pending at this time  History of chronic diarrhea: Initially suspected to be C. difficile colitis given recent antibiotic use over the past month but she has not had any bowel movement since admission and thus unlikely at this point.   -Continue assessing her symptoms   Macrocytic anemia: Hemoglobin on admission has been consistent with her baseline 9-11 since January though MCV now stable at >100 since admission. She was on iron supplementation following her January hospitalization though reported nonadherence to therapy given GI side effects. B12 within normal limits. -Follow-up folate  Thrombocytosis: Platelets have been above the upper limit of normal since admission and over prior values. Though she reports her father had myelofibrosis and is over the age of 43,  COPD: No signs of wheezing or worsening respiratory symptoms as her cough appears to be chronic. Per chart review, it appears she never followed up to have any of her outpatient PFTs performed, dating as far back as 2012. Home medications include Advair 1 puff twice daily, DuoNeb's 4 times daily, albuterol 1 puff 2-4 hours as needed for wheezing. -Continue Dulera 2 puffs twice daily instead of Advair along with the remainder of her home medications  Chronic pain: Per  DEA database, she recently filled oxycodone 5 mg 30 tablets on 07/29/14. She is also on  Neurontin 300 mg 3 times daily. -Continue Neurontin -Tylenol and oxycodone as noted above  Hypocalcemia: Corrected calcium 8.5 on admission, stable from 2 weeks ago.  -Consider calcium supplementation pending improvement  in her gastric symptoms   Allergic rhinitis: Her medications include Zyrtec 10 mg daily, Flonase 1 spray in both nostrils daily, Benadryl 25 mg as needed for itching. -Continue home medications  #FEN:  -Diet: Heart healthy  #DVT prophylaxis: Lovenox  #CODE STATUS: DNR/DNI -Defer to sister [name] [phone] if patients lacks decision-making capacity -Confirmed with patient on admission  Dispo: Disposition is deferred at this time, awaiting improvement of current medical problems.    The patient does have a current PCP (Merideth Bosque Sherrye Payor, MD) and does  need an [facility] hospital follow-up appointment after discharge.  The patient does have transportation limitations that hinder transportation to clinic appointments.  .Services Needed at time of discharge: Y = Yes, Blank = No PT:   OT:   RN:   Equipment:   Other:       Riccardo Dubin, MD 08/09/2014, 11:21 AM

## 2014-08-10 DIAGNOSIS — R4182 Altered mental status, unspecified: Secondary | ICD-10-CM | POA: Diagnosis not present

## 2014-08-10 MED ORDER — NICOTINE 7 MG/24HR TD PT24
7.0000 mg | MEDICATED_PATCH | Freq: Every day | TRANSDERMAL | Status: DC
  Administered 2014-08-10 – 2014-08-12 (×3): 7 mg via TRANSDERMAL
  Filled 2014-08-10 (×4): qty 1

## 2014-08-10 MED ORDER — NICOTINE 7 MG/24HR TD PT24: 3.5000 mg | MEDICATED_PATCH | Freq: Every day | TRANSDERMAL | Status: DC

## 2014-08-10 NOTE — Progress Notes (Signed)
Subjective: This morning, she denies any complaints and reports having regular bowel movements. She would like to go to the facility which allows her to smoke cigarettes though acknowledges that it is not helpful or contributory to her respiratory disease. She still reports seeing shadows though they are not telling her to hurt herself or others.  Objective: Vital signs in last 24 hours: Filed Vitals:   08/10/14 0614 08/10/14 0923 08/10/14 1225 08/10/14 1323  BP: 136/64   128/58  Pulse: 74   65  Temp: 98.1 F (36.7 C)   98.5 F (36.9 C)  TempSrc: Oral   Oral  Resp: 16   15  Height:      Weight: 105 lb 8.3 oz (47.863 kg)     SpO2: 94% 93% 94% 99%   Weight change: 0 oz (0 kg)  Intake/Output Summary (Last 24 hours) at 08/10/14 1357 Last data filed at 08/10/14 0945  Gross per 24 hour  Intake    940 ml  Output      0 ml  Net    940 ml   General: Elderly-appearing cachectic Caucasian female, sitting in bed, 3L O2 by nasal cannula HEENT: PERRL, EOMI, no scleral icterus, oropharynx clear Cardiac: RRR, no rubs, murmurs or gallops Pulm: clear to auscultation bilaterally in the posterior lung fields though poor air movement, generalized wheezing audible as well albeit mild Abd: soft, generalized tenderness, distended, BS present Ext: warm and well perfused, no pedal or tibial edema Neuro: Moving all extremities spontaneously, responds to questions appropriately, oriented to name, place, time, date of the week, visual hallucinations ["shadows"]   Lab Results: Basic Metabolic Panel:  Recent Labs Lab 08/06/14 0524  08/08/14 0342 08/09/14 0328  NA 138  < > 142 141  K 3.5  < > 3.9 4.5  CL 93*  < > 96* 97*  CO2 35*  < > 37* 37*  GLUCOSE 70  < > 89 87  BUN 6  < > <5* 8  CREATININE 0.31*  < > 0.31* 0.30*  CALCIUM 7.8*  < > 7.7* 8.0*  MG 1.7  --   --   --   < > = values in this interval not displayed. Liver Function Tests:  Recent Labs Lab 08/05/14 0933  AST 25  ALT 18    ALKPHOS 76  BILITOT 0.5  PROT 5.8*  ALBUMIN 3.2*   CBC:  Recent Labs Lab 08/05/14 0933  08/07/14 0335 08/08/14 0342  WBC 4.7  < > 5.2 4.7  NEUTROABS 2.9  --   --   --   HGB 9.8*  < > 9.1* 9.4*  HCT 32.3*  < > 29.0* 30.9*  MCV 101.6*  < > 101.8* 101.3*  PLT 463*  < > 471* 517*  < > = values in this interval not displayed. Alcohol Level:  Recent Labs Lab 08/05/14 1946  [place] <5   Studies/Results: No results found. Medications: I have reviewed the patient's current medications. Scheduled Meds: . ARIPiprazole  5 mg Oral BID  . budesonide  0.5 mg Nebulization QID  . enoxaparin (LOVENOX) injection  40 mg Subcutaneous Q24H  . fluticasone  1 spray Each Nare Daily  . gabapentin  300 mg Oral TID  . ipratropium-albuterol  3 mL Nebulization QID  . lamoTRIgine  100 mg Oral Daily  . loratadine  10 mg Oral Daily  . mometasone-formoterol  2 puff Inhalation BID  . nicotine  7 mg Transdermal Daily  . pantoprazole  40 mg  Oral BID  . polyethylene glycol  17 g Oral BID  . senna-docusate  2 tablet Oral BID  . sodium chloride  3 mL Intravenous Q12H   Continuous Infusions:  PRN Meds:.acetaminophen, diphenhydrAMINE, guaiFENesin, ondansetron, ondansetron, oxyCODONE, zolpidem Assessment/Plan:  [name] is a 64 year old female with COPD, iron deficiency anemia, chronic pain, allergic rhinitis, bipolar disorder hospitalized for altered mental status and right upper quadrant abdominal pain found to have mild hypocalcemia and visual hallucinations.  Bipolar disorder complicated by visual hallucinations: It appears she is tolerating Abilify 5 mg twice daily well along with Lamictal 100 mg daily. She does have history of passive suicidal ideation from her hallucinations. -Continue Lamictal and Abilify  Acute on chronic abdominal pain:  Possibly secondary to constipation though GERD or gastritis cannot be excluded. She is back on a reduced dose her home pain medication which makes opioid  withdrawal less likely. Functional dyspepsia is certainly a consideration exclusion of other conditions though she denies any nausea or vomiting or early satiety. She was able to have a bowel movement yesterday which is reassuring. -Continue Senokot-S 2 tablets along with MiraLAX -Continue Protonix 40 mg twice daily -Continue oxycodone 8 mg 3 times daily along with Tylenol 650 mg every 6 hours for pain  Altered mental status : Likely secondary to adverse effect of pain medications at home in the setting of her comorbid psychiatric illness. Per home health aide, she has seen her daily since it appears her confusion and presumed falls have been happening for at least a month, prior to the change in her oxygen therapy. Per social work note today, she has selected [facility]. -Follow-up social work recommendations  History of chronic diarrhea: Initially suspected to be C. difficile colitis given recent antibiotic use over the past month but she has not had any bowel movement since admission and thus unlikely at this point.   -Continue assessing her symptoms   Macrocytic anemia: Hemoglobin on admission has been consistent with her baseline 9-11 since January though MCV now stable at >100 since admission. She was on iron supplementation following her January hospitalization though reported nonadherence to therapy given GI side effects.  -Follow-up folate  Thrombocytosis: Platelets have been above the upper limit of normal since admission and over prior value though she reports her father had myelofibrosis and is over the age of 52. However, it may be attributed to iron deficiency.  COPD: No signs of wheezing or worsening respiratory symptoms as her cough appears to be chronic. Per chart review, it appears she never followed up to have any of her outpatient PFTs performed, dating as far back as 2012. Home medications include Advair 1 puff twice daily, DuoNeb's 4 times daily, albuterol 1 puff 2-4 hours as  needed for wheezing. -Continue Dulera 2 puffs twice daily instead of Advair along with the remainder of her home medications  Chronic pain: Per [place] DEA database, she recently filled oxycodone 5 mg 30 tablets on 07/29/14. She is also on Neurontin 300 mg 3 times daily. -Continue Neurontin -Tylenol and oxycodone as noted above  Hypocalcemia: Corrected calcium 8.5 on admission, stable from 2 weeks ago.  -Consider calcium supplementation on discharge   Allergic rhinitis: Her medications include Zyrtec 10 mg daily, Flonase 1 spray in both nostrils daily, Benadryl 25 mg as needed for itching. -Continue home medications  #FEN:  -Diet: Heart healthy  #DVT prophylaxis: Lovenox  #CODE STATUS: DNR/DNI -Defer to sister [name] [phone] if patients lacks decision-making capacity -Confirmed with patient  on admission  Dispo: Disposition is deferred at this time, awaiting improvement of current medical problems.    The patient does have a current PCP (Tekelia Kareem Sherrye Payor, MD) and does need an [facility] hospital follow-up appointment after discharge.  The patient does have transportation limitations that hinder transportation to clinic appointments.  .Services Needed at time of discharge: Y = Yes, Blank = No PT:   OT:   RN:   Equipment:   Other:       Riccardo Dubin, MD 08/10/2014, 1:57 PM

## 2014-08-10 NOTE — Progress Notes (Signed)
CSW spoke with Pt regaurding bed choice.   Pt chose [facility]. CSW will contact facility to accept bed offer.    CSW will continue to follow Pt for d/c planning and support.    [facility]   2S, 99M,3S, 5N, 6N [phone]

## 2014-08-11 DIAGNOSIS — D539 Nutritional anemia, unspecified: Secondary | ICD-10-CM | POA: Diagnosis not present

## 2014-08-11 DIAGNOSIS — R1011 Right upper quadrant pain: Secondary | ICD-10-CM | POA: Diagnosis not present

## 2014-08-11 DIAGNOSIS — F3164 Bipolar disorder, current episode mixed, severe, with psychotic features: Secondary | ICD-10-CM | POA: Diagnosis not present

## 2014-08-11 DIAGNOSIS — K529 Noninfective gastroenteritis and colitis, unspecified: Secondary | ICD-10-CM | POA: Diagnosis not present

## 2014-08-11 DIAGNOSIS — D509 Iron deficiency anemia, unspecified: Secondary | ICD-10-CM | POA: Diagnosis not present

## 2014-08-11 DIAGNOSIS — R4182 Altered mental status, unspecified: Secondary | ICD-10-CM | POA: Diagnosis not present

## 2014-08-11 DIAGNOSIS — D473 Essential (hemorrhagic) thrombocythemia: Secondary | ICD-10-CM

## 2014-08-11 MED ORDER — CALCIUM CARBONATE ANTACID 500 MG PO CHEW
1.0000 | CHEWABLE_TABLET | Freq: Three times a day (TID) | ORAL | Status: DC
  Administered 2014-08-11 – 2014-08-12 (×3): 200 mg via ORAL
  Filled 2014-08-11 (×4): qty 1

## 2014-08-11 MED ORDER — POLYETHYLENE GLYCOL 3350 17 G PO PACK: 17.0000 g | PACK | Freq: Every day | ORAL | Status: DC | PRN

## 2014-08-11 MED ORDER — SENNOSIDES-DOCUSATE SODIUM 8.6-50 MG PO TABS
1.0000 | ORAL_TABLET | Freq: Two times a day (BID) | ORAL | Status: DC
  Administered 2014-08-11: 1 via ORAL
  Filled 2014-08-11: qty 1

## 2014-08-11 NOTE — Progress Notes (Signed)
  Date: 08/11/2014  Patient name: [name]  Medical record number: [id number]  Date of birth: [birth year]   This patient's plan of care was discussed with the house staff. Please see Dr. Serita Grit note for complete details. I concur with his findings.  Pending SNF for discharge.  She is on oxycodone 5mg  every 8 hours. She should likely not be on more than this given her confusion and falls at home.  Conversation between her PCP and pain management doctor regarding this issue should take place in the near future.    Sid Falcon, MD 08/11/2014, 2:07 PM

## 2014-08-11 NOTE — Progress Notes (Signed)
Occupational Therapy Treatment Patient Details Name: [name] MRN: [id number] DOB: 08-17-1950 Today's Date: 08/11/2014    History of present illness [name] is a 64 year old female with COPD, iron deficiency anemia, chronic pain, allergic rhinitis, bipolar disorder who presented with confusion and abdominal pain..   OT comments  This 64 yo female making progress--mainly at a S/setup level for BADLs at this point. Will benefit from follow up OT at SNF to get to a Mod I/Independent level with BADLs and IADLs to go home alone.  Follow Up Recommendations  SNF    Equipment Recommendations  None recommended by OT       Precautions / Restrictions Precautions Precautions: Fall Restrictions Weight Bearing Restrictions: No       Mobility Bed Mobility               General bed mobility comments: Pt up in recliner upon my arrival  Transfers Overall transfer level: Needs assistance Equipment used: Rolling walker (2 wheeled) Transfers: Sit to/from Stand Sit to Stand: Supervision         General transfer comment: Ambulation S with RW and 3 liters of O2 (needs A for management of O2 tank) 150 feet    Balance Overall balance assessment: Needs assistance Sitting-balance support: No upper extremity supported;Feet supported Sitting balance-Leahy Scale: Good     Standing balance support: No upper extremity supported Standing balance-Leahy Scale: Fair                     ADL Overall ADL's : Needs assistance/impaired     Grooming: Wash/dry hands;Supervision/safety;Set up;Standing                   Toilet Transfer: Supervision/safety;Ambulation;Comfort height toilet;Grab bars   Toileting- Clothing Manipulation and Hygiene: Supervision/safety;Sit to/from stand                Vision                 Additional Comments: Wear glasses, no change from baseline          Cognition   Behavior During Therapy: WFL for tasks  assessed/performed Overall Cognitive Status: Within Functional Limits for tasks assessed                                    Pertinent Vitals/ Pain       Pain Assessment: No/denies pain         Frequency Min 2X/week     Progress Toward Goals  OT Goals(current goals can now be found in the care plan section)  Progress towards OT goals: Progressing toward goals     Plan Discharge plan remains appropriate       End of Session Equipment Utilized During Treatment: Rolling walker;Oxygen (3 liters)   Activity Tolerance Patient tolerated treatment well   Patient Left in chair;with call bell/phone within reach   Nurse Communication  (NT: pt would like to take another short walk later today)        Time: 1014-1040 OT Time Calculation (min): 26 min  Charges: OT General Charges $OT Visit: 1 Procedure OT Treatments $Self Care/Home Management : 23-37 mins  Almon Register [phone] 08/11/2014, 10:47 AM

## 2014-08-11 NOTE — Progress Notes (Addendum)
Subjective: This morning, she reports to [place] that she just had a bath but was not ambulated yesterday. She continues to report having a daily bowel movement that has thus refused Senokot and MiraLAX. We acknowledge to keep these medications on as needed  Objective: Vital signs in last 24 hours: Filed Vitals:   08/10/14 1913 08/10/14 2056 08/11/14 0600 08/11/14 0928  BP:  114/59 119/64   Pulse:  73 65   Temp:  98.2 F (36.8 C) 97.6 F (36.4 C)   TempSrc:  Oral Oral   Resp:  16 17   Height:      Weight:   96 lb 1.9 oz (43.6 kg)   SpO2: 95% 97% 97% 97%   Weight change: -6 lb 1.3 oz (-2.758 kg)  Intake/Output Summary (Last 24 hours) at 08/11/14 1313 Last data filed at 08/11/14 0830  Gross per 24 hour  Intake    820 ml  Output      0 ml  Net    820 ml   General: Elderly-appearing cachectic Caucasian female, sitting in bed, 3L O2 by nasal cannula HEENT: PERRL, EOMI, no scleral icterus, oropharynx clear Cardiac: RRR, no rubs, murmurs or gallops Pulm: clear to auscultation bilaterally in the posterior lung fields though poor air movement, generalized wheezing audible as well albeit mild Abd: soft, generalized tenderness, distended, BS present Ext: warm and well perfused, no pedal or tibial edema Neuro: Moving all extremities spontaneously, responds to questions appropriately, oriented to name, place, time, date of the week, visual hallucinations ["shadows"]   Lab Results: Basic Metabolic Panel:  Recent Labs Lab 08/06/14 0524  08/08/14 0342 08/09/14 0328  NA 138  < > 142 141  K 3.5  < > 3.9 4.5  CL 93*  < > 96* 97*  CO2 35*  < > 37* 37*  GLUCOSE 70  < > 89 87  BUN 6  < > <5* 8  CREATININE 0.31*  < > 0.31* 0.30*  CALCIUM 7.8*  < > 7.7* 8.0*  MG 1.7  --   --   --   < > = values in this interval not displayed. Liver Function Tests:  Recent Labs Lab 08/05/14 0933  AST 25  ALT 18  ALKPHOS 76  BILITOT 0.5  PROT 5.8*  ALBUMIN 3.2*   CBC:  Recent Labs Lab  08/05/14 0933  08/07/14 0335 08/08/14 0342  WBC 4.7  < > 5.2 4.7  NEUTROABS 2.9  --   --   --   HGB 9.8*  < > 9.1* 9.4*  HCT 32.3*  < > 29.0* 30.9*  MCV 101.6*  < > 101.8* 101.3*  PLT 463*  < > 471* 517*  < > = values in this interval not displayed. Alcohol Level:  Recent Labs Lab 08/05/14 1946  [place] <5   Studies/Results: No results found. Medications: I have reviewed the patient's current medications. Scheduled Meds: . ARIPiprazole  5 mg Oral BID  . budesonide  0.5 mg Nebulization QID  . enoxaparin (LOVENOX) injection  40 mg Subcutaneous Q24H  . fluticasone  1 spray Each Nare Daily  . gabapentin  300 mg Oral TID  . ipratropium-albuterol  3 mL Nebulization QID  . lamoTRIgine  100 mg Oral Daily  . loratadine  10 mg Oral Daily  . mometasone-formoterol  2 puff Inhalation BID  . nicotine  7 mg Transdermal Daily  . pantoprazole  40 mg Oral BID  . senna-docusate  1-2 tablet Oral BID  .  sodium chloride  3 mL Intravenous Q12H   Continuous Infusions:  PRN Meds:.acetaminophen, diphenhydrAMINE, guaiFENesin, ondansetron, oxyCODONE, polyethylene glycol, zolpidem Assessment/Plan:  [name] is a 64 year old female with COPD, iron deficiency anemia, chronic pain, allergic rhinitis, bipolar disorder hospitalized for altered mental status and right upper quadrant abdominal pain found to have mild hypocalcemia and visual hallucinations.  Bipolar disorder complicated by visual hallucinations: It appears she is tolerating Abilify 5 mg twice daily well along with Lamictal 100 mg daily. She does have history of passive suicidal ideation from her hallucinations. -Continue Lamictal and Abilify  Acute on chronic abdominal pain:  Possibly secondary to constipation though GERD or gastritis cannot be excluded.  -Change Senokot-S 2 tablets, MiraLAX to as needed -Continue Protonix 40 mg twice daily -Continue oxycodone 8 mg 3 times daily along with Tylenol 650 mg every 6 hours for pain  Altered  mental status : Likely secondary to adverse effect of pain medications at home in the setting of her comorbid psychiatric illness. Per home health aide, she has seen her daily since it appears her confusion and presumed falls have been happening for at least a month, prior to the change in her oxygen therapy. Per social work, she has selected a SNF facility and will likely go tomorrow. She'll also have a Education officer, museum at the facility to follow her through her stay there and facilitate a transition to ALF if necessary.  History of chronic diarrhea: Initially suspected to be C. difficile colitis given recent antibiotic use over the past month but she has not had any bowel movement since admission and thus unlikely at this point.   -Continue assessing her symptoms   Macrocytic anemia: Hemoglobin on admission has been consistent with her baseline 9-11 since January though MCV now stable at >100 since admission. She was on iron supplementation following her January hospitalization though reported nonadherence to therapy given GI side effects.  -Follow-up folate  Thrombocytosis: Platelets have been above the upper limit of normal since admission and over prior value though she reports her father had myelofibrosis and is over the age of 18. However, it may be attributed to iron deficiency.  COPD: No signs of wheezing or worsening respiratory symptoms as her cough appears to be chronic. Per chart review, it appears she never followed up to have any of her outpatient PFTs performed, dating as far back as 2012. Home medications include Advair 1 puff twice daily, DuoNeb's 4 times daily, albuterol 1 puff 2-4 hours as needed for wheezing. -Continue Dulera 2 puffs twice daily instead of Advair along with the remainder of her home medications -Continue nicotine patch 3.5 g  Chronic pain: Per [place] DEA database, she recently filled oxycodone 5 mg 30 tablets on 07/29/14 though has been taking this medication 2 times daily  for the last several days suggesting that she may not have as high of a pain requirement as initially thought per her prescription. She is also on Neurontin 300 mg 3 times daily. -Continue Neurontin -Tylenol and oxycodone as noted above  Hypocalcemia: Corrected calcium 8.5 on admission, stable from 2 weeks ago.  -Continue Tums 500 mg 3 times daily  Allergic rhinitis: Her medications include Zyrtec 10 mg daily, Flonase 1 spray in both nostrils daily, Benadryl 25 mg as needed for itching. -Continue home medications  #FEN:  -Diet: Heart healthy  #DVT prophylaxis: Lovenox  #CODE STATUS: DNR/DNI -Defer to sister [name] [phone] if patients lacks decision-making capacity -Confirmed with patient on admission  Dispo: Disposition  is deferred at this time, awaiting improvement of current medical problems.    The patient does have a current PCP (Nataline Basara Sherrye Payor, MD) and does need an [facility] hospital follow-up appointment after discharge.  The patient does have transportation limitations that hinder transportation to clinic appointments.  .Services Needed at time of discharge: Y = Yes, Blank = No PT:   OT:   RN:   Equipment:   Other:       Riccardo Dubin, MD 08/11/2014, 1:13 PM

## 2014-08-12 DIAGNOSIS — T50905S Adverse effect of unspecified drugs, medicaments and biological substances, sequela: Secondary | ICD-10-CM

## 2014-08-12 DIAGNOSIS — D649 Anemia, unspecified: Secondary | ICD-10-CM | POA: Diagnosis not present

## 2014-08-12 DIAGNOSIS — D473 Essential (hemorrhagic) thrombocythemia: Secondary | ICD-10-CM | POA: Diagnosis not present

## 2014-08-12 DIAGNOSIS — G9340 Encephalopathy, unspecified: Secondary | ICD-10-CM | POA: Diagnosis not present

## 2014-08-12 DIAGNOSIS — J309 Allergic rhinitis, unspecified: Secondary | ICD-10-CM | POA: Diagnosis not present

## 2014-08-12 DIAGNOSIS — G8929 Other chronic pain: Secondary | ICD-10-CM | POA: Diagnosis not present

## 2014-08-12 DIAGNOSIS — R42 Dizziness and giddiness: Secondary | ICD-10-CM | POA: Diagnosis not present

## 2014-08-12 DIAGNOSIS — F419 Anxiety disorder, unspecified: Secondary | ICD-10-CM | POA: Diagnosis not present

## 2014-08-12 DIAGNOSIS — J441 Chronic obstructive pulmonary disease with (acute) exacerbation: Secondary | ICD-10-CM | POA: Diagnosis not present

## 2014-08-12 DIAGNOSIS — K5900 Constipation, unspecified: Secondary | ICD-10-CM | POA: Diagnosis not present

## 2014-08-12 DIAGNOSIS — R262 Difficulty in walking, not elsewhere classified: Secondary | ICD-10-CM | POA: Diagnosis not present

## 2014-08-12 DIAGNOSIS — R109 Unspecified abdominal pain: Secondary | ICD-10-CM | POA: Diagnosis not present

## 2014-08-12 DIAGNOSIS — K219 Gastro-esophageal reflux disease without esophagitis: Secondary | ICD-10-CM | POA: Diagnosis not present

## 2014-08-12 DIAGNOSIS — F17210 Nicotine dependence, cigarettes, uncomplicated: Secondary | ICD-10-CM | POA: Diagnosis not present

## 2014-08-12 DIAGNOSIS — E785 Hyperlipidemia, unspecified: Secondary | ICD-10-CM | POA: Diagnosis not present

## 2014-08-12 DIAGNOSIS — R4182 Altered mental status, unspecified: Secondary | ICD-10-CM | POA: Diagnosis not present

## 2014-08-12 DIAGNOSIS — J449 Chronic obstructive pulmonary disease, unspecified: Secondary | ICD-10-CM | POA: Diagnosis not present

## 2014-08-12 DIAGNOSIS — F319 Bipolar disorder, unspecified: Secondary | ICD-10-CM | POA: Diagnosis not present

## 2014-08-12 DIAGNOSIS — R488 Other symbolic dysfunctions: Secondary | ICD-10-CM | POA: Diagnosis not present

## 2014-08-12 DIAGNOSIS — F3164 Bipolar disorder, current episode mixed, severe, with psychotic features: Secondary | ICD-10-CM | POA: Diagnosis not present

## 2014-08-12 DIAGNOSIS — R441 Visual hallucinations: Secondary | ICD-10-CM | POA: Diagnosis not present

## 2014-08-12 DIAGNOSIS — D539 Nutritional anemia, unspecified: Secondary | ICD-10-CM | POA: Diagnosis not present

## 2014-08-12 DIAGNOSIS — M6281 Muscle weakness (generalized): Secondary | ICD-10-CM | POA: Diagnosis not present

## 2014-08-12 DIAGNOSIS — Z9981 Dependence on supplemental oxygen: Secondary | ICD-10-CM | POA: Diagnosis not present

## 2014-08-12 DIAGNOSIS — M810 Age-related osteoporosis without current pathological fracture: Secondary | ICD-10-CM | POA: Diagnosis not present

## 2014-08-12 DIAGNOSIS — D509 Iron deficiency anemia, unspecified: Secondary | ICD-10-CM | POA: Diagnosis not present

## 2014-08-12 LAB — FOLATE RBC
Folate, Hemolysate: 413.2 ng/mL
Folate, RBC: 1420 ng/mL (ref >498)
Hematocrit: 29.1 % — ABNORMAL LOW (ref 34.0–46.6)

## 2014-08-12 MED ORDER — ZOLPIDEM TARTRATE 5 MG PO TABS: 5.0000 mg | ORAL_TABLET | Freq: Every evening | ORAL | Status: DC | PRN

## 2014-08-12 MED ORDER — OXYCODONE-ACETAMINOPHEN 5-325 MG PO TABS: 1.0000 | ORAL_TABLET | Freq: Two times a day (BID) | ORAL | Status: DC | PRN

## 2014-08-12 MED ORDER — ARIPIPRAZOLE 5 MG PO TABS: 5.0000 mg | ORAL_TABLET | Freq: Two times a day (BID) | ORAL | Status: DC

## 2014-08-12 MED ORDER — POLYETHYLENE GLYCOL 3350 17 G PO PACK: 17.0000 g | PACK | Freq: Every day | ORAL | Status: DC | PRN

## 2014-08-12 MED ORDER — CALCIUM CARBONATE ANTACID 500 MG PO CHEW: 1.0000 | CHEWABLE_TABLET | Freq: Three times a day (TID) | ORAL | Status: DC

## 2014-08-12 NOTE — Discharge Planning (Signed)
Patient to be discharged to [facility]. Patient updated at bedside.  Facility: [facility] RN report number: [phone] Transportation: EMS ([address]  Lubertha Sayres, [place] [phone]) and Surgical [phone])

## 2014-08-12 NOTE — Progress Notes (Signed)
Patient discharged to [facility] as ordered, report was given to nurse Fabio Neighbors, transported pt via [place].

## 2014-08-12 NOTE — Progress Notes (Signed)
Physical Therapy Treatment Patient Details Name: [name] MRN: [id number] DOB: 12/22/1950 Today's Date: 08/12/2014    History of Present Illness [name] is a 64 year old female with COPD, iron deficiency anemia, chronic pain, allergic rhinitis, bipolar disorder who presented with confusion and abdominal pain..    PT Comments    Pt with significant improvement in activity tolerance this date. Pt con't to at increased falls risk due to decreased insight to safety and management of O2 tubing and RW. Acute PT to con't to follow to progress independence with mobility.   Follow Up Recommendations  SNF;Supervision/Assistance - 24 hour     Equipment Recommendations  None recommended by PT    Recommendations for Other Services       Precautions / Restrictions Precautions Precautions: Fall Restrictions Weight Bearing Restrictions: No    Mobility  Bed Mobility               General bed mobility comments: pt up in recliner  Transfers Overall transfer level: Needs assistance Equipment used: Rolling walker (2 wheeled) Transfers: Sit to/from Stand Sit to Stand: Supervision         General transfer comment: v/c's for management of O2 tubing, used grab bar in bathroom  Ambulation/Gait Ambulation/Gait assistance: Supervision Ambulation Distance (Feet): 165 Feet Assistive device: Rolling walker (2 wheeled) Gait Pattern/deviations: Step-through pattern;Decreased stride length Gait velocity: decreased Gait velocity interpretation: Below normal speed for age/gender General Gait Details: extremely slow but able to complete 165' without standing or sitting rest break   Stairs            Wheelchair Mobility    Modified Rankin (Stroke Patients Only)       Balance                                    Cognition Arousal/Alertness: Awake/alert Behavior During Therapy: WFL for tasks assessed/performed Overall Cognitive Status: Within Functional  Limits for tasks assessed                      Exercises      General Comments General comments (skin integrity, edema, etc.): pt supervision for hygiene s/p tolieting and washing hands. v/c's for safe walker management and O2 tube management      Pertinent Vitals/Pain Pain Assessment: No/denies pain    Home Living                      Prior Function            PT Goals (current goals can now be found in the care plan section) Progress towards PT goals: Progressing toward goals    Frequency  Min 2X/week    PT Plan Current plan remains appropriate;Frequency needs to be updated    Co-evaluation             End of Session Equipment Utilized During Treatment: Oxygen Activity Tolerance: Patient tolerated treatment well Patient left: in chair;with call bell/phone within reach     Time: 1355-1418 PT Time Calculation (min) (ACUTE ONLY): 23 min  Charges:  $Gait Training: 8-22 mins $Therapeutic Activity: 8-22 mins                    G Codes:      Kingsley Callander 08/12/2014, 3:45 PM   Kittie Plater, PT, DPT Pager #: [phone] Office #: [phone]

## 2014-08-12 NOTE — Progress Notes (Signed)
  Date: 08/12/2014  Patient name: [name]  Medical record number: [id number]  Date of birth: 1950/11/16   This patient's plan of care was discussed with the house staff. Please see Dr. Serita Grit note for complete details. I concur with his findings.  Plan for d/c to SNF today.    Sid Falcon, MD 08/12/2014, 4:03 PM

## 2014-08-12 NOTE — Progress Notes (Signed)
Subjective: This morning, she wanted additional pain medication as she received 3 doses of her oxycodone 5 mg. We explained to her that while being in pain is certainly not ideal, we also had to weigh the risk of her falling and becoming confused to which she acknowledged understanding. We also explained that we would touch base with her pain clinic to which she expressed agreement.  Upon speaking with the provider who prescribes her pain medication, her oxycodone was actually dose down to 5 mg every 12 hours as needed for pain. In fact, one of the prior home health aides was actually diverging medication from the patient though the pain clinic shared similar concerns with [place]. They also were not aware of her comorbid psychiatric illness and her ongoing hallucinations and passive suicidal ideation.  Objective: Vital signs in last 24 hours: Filed Vitals:   08/11/14 2124 08/11/14 2243 08/12/14 0559 08/12/14 1238  BP: 95/45  120/63   Pulse: 70  65   Temp: 98.6 F (37 C)  98.6 F (37 C)   TempSrc: Oral  Oral   Resp: 15  15   Height:      Weight:   91 lb 7.9 oz (41.5 kg)   SpO2: 98% 96% 96% 96%   Weight change: -4 lb 10.1 oz (-2.1 kg)  Intake/Output Summary (Last 24 hours) at 08/12/14 1333 Last data filed at 08/12/14 0600  Gross per 24 hour  Intake    600 ml  Output      0 ml  Net    600 ml   General: Elderly-appearing cachectic Caucasian female, sitting in bed, 3L O2 by nasal cannula HEENT: PERRL, EOMI, no scleral icterus, oropharynx clear Cardiac: RRR, no rubs, murmurs or gallops Pulm: clear to auscultation bilaterally in the posterior lung fields though poor air movement, generalized wheezing audible as well albeit mild Abd: soft, generalized tenderness, distended, BS present Ext: warm and well perfused, no pedal or tibial edema Neuro: Moving all extremities spontaneously, responds to questions appropriately, oriented to name, place, time, date of the week, visual hallucinations  ["shadows, dogs"] though no suicidal or homicidal ideation  Lab Results: Basic Metabolic Panel:  Recent Labs Lab 08/06/14 0524  08/08/14 0342 08/09/14 0328  NA 138  < > 142 141  K 3.5  < > 3.9 4.5  CL 93*  < > 96* 97*  CO2 35*  < > 37* 37*  GLUCOSE 70  < > 89 87  BUN 6  < > <5* 8  CREATININE 0.31*  < > 0.31* 0.30*  CALCIUM 7.8*  < > 7.7* 8.0*  MG 1.7  --   --   --   < > = values in this interval not displayed.  CBC:  Recent Labs Lab 08/07/14 0335 08/08/14 0342  WBC 5.2 4.7  HGB 9.1* 9.4*  HCT 29.0* 30.9*  MCV 101.8* 101.3*  PLT 471* 517*   Alcohol Level:  Recent Labs Lab 08/05/14 1946  [place] <5   Studies/Results: No results found. Medications: I have reviewed the patient's current medications. Scheduled Meds: . ARIPiprazole  5 mg Oral BID  . budesonide  0.5 mg Nebulization QID  . calcium carbonate  1 tablet Oral TID WC  . enoxaparin (LOVENOX) injection  40 mg Subcutaneous Q24H  . fluticasone  1 spray Each Nare Daily  . gabapentin  300 mg Oral TID  . ipratropium-albuterol  3 mL Nebulization QID  . lamoTRIgine  100 mg Oral Daily  . loratadine  10 mg Oral Daily  . mometasone-formoterol  2 puff Inhalation BID  . nicotine  7 mg Transdermal Daily  . pantoprazole  40 mg Oral BID  . sodium chloride  3 mL Intravenous Q12H   Continuous Infusions:  PRN Meds:.acetaminophen, diphenhydrAMINE, guaiFENesin, ondansetron, oxyCODONE, polyethylene glycol, zolpidem Assessment/Plan:  [name] is a 64 year old female with COPD, iron deficiency anemia, chronic pain, allergic rhinitis, bipolar disorder hospitalized for altered mental status and right upper quadrant abdominal pain found to have mild hypocalcemia and visual hallucinations.  Bipolar disorder complicated by visual hallucinations: It appears she is tolerating Abilify 5 mg twice daily well along with Lamictal 100 mg daily. She does have history of passive suicidal ideation from her hallucinations. -Continue Lamictal  and Abilify  Acute on chronic abdominal pain:  Possibly secondary to constipation though GERD or gastritis cannot be excluded.  -Discharge on MiraLAX to as needed, Protonix 40 mg twice daily, Percocet 5/325 mg every 12 hours as needed for pain  Altered mental status : Likely secondary to adverse effect of pain medications at home in the setting of her comorbid psychiatric illness. Per home health aide, she has seen her daily since it appears her confusion and presumed falls have been happening for at least a month, prior to the change in her oxygen therapy.  -Transferred to SNF today with recommendations for transition to ALF once she completes her stay at SNF.  History of chronic diarrhea: Initially suspected to be C. difficile colitis given recent antibiotic use over the past month but she has not had any bowel movement since admission and thus unlikely at this point.    Macrocytic anemia: Hemoglobin on admission has been consistent with her baseline 9-11 since January though MCV now stable at >100 since admission. She was on iron supplementation following her January hospitalization though reported nonadherence to therapy given GI side effects.  -Follow-up folate as outpatient  Thrombocytosis: Platelets have been above the upper limit of normal since admission and over prior value though she reports her father had myelofibrosis and is over the age of 47. However, it may be attributed to iron deficiency.  COPD: No signs of wheezing or worsening respiratory symptoms as her cough appears to be chronic. Per chart review, it appears she never followed up to have any of her outpatient PFTs performed, dating as far back as 2012. Home medications include Advair 1 puff twice daily, DuoNeb's 4 times daily, albuterol 1 puff 2-4 hours as needed for wheezing. -Continue Dulera 2 puffs twice daily instead of Advair along with the remainder of her home medications -Advised her against smoking  Chronic pain:  Per [place] DEA database, she recently filled oxycodone 5 mg 30 tablets on 07/29/14 though has been taking this medication 2 times daily for the last several days suggesting that she may not have as high of a pain requirement as initially thought per her prescription. She is also on Neurontin 300 mg 3 times daily. -Resume home Neurontin and oxycodone as noted above  Hypocalcemia: Corrected calcium 8.5 on admission, stable from 2 weeks ago.  -Discharge on Tums 500 mg 3 times daily with plan to follow-up CMET  Allergic rhinitis: Her medications include Zyrtec 10 mg daily, Flonase 1 spray in both nostrils daily, Benadryl 25 mg as needed for itching. -Continue home medications  #FEN:  -Diet: Heart healthy  #DVT prophylaxis: Lovenox  #CODE STATUS: DNR/DNI -Defer to sister [name] [phone] if patients lacks decision-making capacity -Confirmed with patient on admission  Dispo: Stable for discharge today.    The patient does have a current PCP (Latressa Harries Sherrye Payor, MD) and does need an [facility] hospital follow-up appointment after discharge.  The patient does have transportation limitations that hinder transportation to clinic appointments.  .Services Needed at time of discharge: Y = Yes, Blank = No PT:   OT:   RN:   Equipment:   Other:       Riccardo Dubin, MD 08/12/2014, 1:33 PM

## 2014-08-12 NOTE — Clinical Social Work Placement (Signed)
   CLINICAL SOCIAL WORK PLACEMENT  NOTE  Date:  08/12/2014  Patient Details  Name: [name] MRN: [id number] Date of Birth: 03/23/1950  Clinical Social Work is seeking post-discharge placement for this patient at the [place] level of care (*CSW will initial, date and re-position this form in  chart as items are completed):  Yes   Patient/family provided with [facility] Work Department's list of facilities offering this level of care within the geographic area requested by the patient (or if unable, by the patient's family).  Yes   Patient/family informed of their freedom to choose among providers that offer the needed level of care, that participate in Medicare, Medicaid or managed care program needed by the patient, have an available bed and are willing to accept the patient.  Yes   Patient/family informed of [facility]'s ownership interest in [facility] and [facility], as well as of the fact that they are under no obligation to receive care at these facilities.  PASRR submitted to EDS on 08/08/14     PASRR number received on       Existing PASRR number confirmed on       FL2 transmitted to all facilities in geographic area requested by pt/family on 08/08/14     FL2 transmitted to all facilities within larger geographic area on       Patient informed that his/her managed care company has contracts with or will negotiate with certain facilities, including the following:        Yes   Patient/family informed of bed offers received.  Patient chooses bed at [facility]     Physician recommends and patient chooses bed at      Patient to be transferred to [facility] on 08/12/14.  Patient to be transferred to facility by [facility]     Patient family notified on 08/12/14 of transfer.  Name of family member notified:  Patient alert and oriented.     PHYSICIAN       Additional Comment:     _______________________________________________ Caroline Sauger, LCSW 08/12/2014, 10:28 AM [phone]

## 2014-08-14 DIAGNOSIS — D649 Anemia, unspecified: Secondary | ICD-10-CM | POA: Diagnosis not present

## 2014-08-14 DIAGNOSIS — G9340 Encephalopathy, unspecified: Secondary | ICD-10-CM | POA: Diagnosis not present

## 2014-08-14 DIAGNOSIS — K5900 Constipation, unspecified: Secondary | ICD-10-CM | POA: Diagnosis not present

## 2014-08-14 DIAGNOSIS — J449 Chronic obstructive pulmonary disease, unspecified: Secondary | ICD-10-CM | POA: Diagnosis not present

## 2014-08-14 DIAGNOSIS — F319 Bipolar disorder, unspecified: Secondary | ICD-10-CM | POA: Diagnosis not present

## 2014-08-15 ENCOUNTER — Telehealth: Payer: Self-pay | Admitting: *Deleted

## 2014-08-15 NOTE — Telephone Encounter (Signed)
Pt called c/o she  only had one nebuilzer tx so far today. Talked with Joaquim Lai her nurse  At [facility] - is sch to get  another tx before 3PM - had an emergency on floor and delayed her tx. PSO2 are 95-97%, pt is in no diistress and nurse has explained several times to pt reason for delay. Talked with pt and aware she will get another tx before 3PM - had an emergency on floor. Pt said - thanks. Hilda Blades Alexyss Balzarini RN 08/15/14 2:45PM

## 2014-08-18 ENCOUNTER — Ambulatory Visit: Payer: Self-pay | Admitting: Internal Medicine

## 2014-08-26 ENCOUNTER — Encounter: Payer: Self-pay | Admitting: Internal Medicine

## 2014-08-26 NOTE — Progress Notes (Addendum)
Patient ID: [name], female   DOB: 10-21-1950, 64 y.o.   MRN: [id number]  I am completing paperwork today from [place] that requires my signature:  Received 08/19/14: Authorization of home health orders  Received 08/20/14: authorization of home health orders from February  It is unclear to me why these orders were not signed as I have been signing paperwork from [place] since her January hospitalization for home health.   ADDENDUM 09/05/2014  4:45 PM:  Received 09/04/14: Paperwork requires signature from Dr. Daryll Drown.  I will leave this in her mailbox and route her this message accordingly.

## 2014-08-29 DIAGNOSIS — I10 Essential (primary) hypertension: Secondary | ICD-10-CM | POA: Diagnosis not present

## 2014-08-29 DIAGNOSIS — R0602 Shortness of breath: Secondary | ICD-10-CM | POA: Diagnosis not present

## 2014-08-29 DIAGNOSIS — F3131 Bipolar disorder, current episode depressed, mild: Secondary | ICD-10-CM | POA: Diagnosis not present

## 2014-08-29 DIAGNOSIS — J449 Chronic obstructive pulmonary disease, unspecified: Secondary | ICD-10-CM | POA: Diagnosis not present

## 2014-08-29 DIAGNOSIS — G894 Chronic pain syndrome: Secondary | ICD-10-CM | POA: Diagnosis not present

## 2014-09-01 DIAGNOSIS — I10 Essential (primary) hypertension: Secondary | ICD-10-CM | POA: Diagnosis not present

## 2014-09-01 DIAGNOSIS — F3131 Bipolar disorder, current episode depressed, mild: Secondary | ICD-10-CM | POA: Diagnosis not present

## 2014-09-01 DIAGNOSIS — R0602 Shortness of breath: Secondary | ICD-10-CM | POA: Diagnosis not present

## 2014-09-01 DIAGNOSIS — G894 Chronic pain syndrome: Secondary | ICD-10-CM | POA: Diagnosis not present

## 2014-09-01 DIAGNOSIS — J449 Chronic obstructive pulmonary disease, unspecified: Secondary | ICD-10-CM | POA: Diagnosis not present

## 2014-09-02 ENCOUNTER — Telehealth: Payer: Self-pay | Admitting: Internal Medicine

## 2014-09-02 NOTE — Telephone Encounter (Signed)
Don with [facility] aware - pt has an appt in clinic 09/05/14 - can discuss shoulder pain.

## 2014-09-02 NOTE — Telephone Encounter (Signed)
Thank you for letting me know. Please let her pain management clinic know as well. Marland Kitchen

## 2014-09-02 NOTE — Telephone Encounter (Signed)
Calling to let nurse know  The plan of care.

## 2014-09-02 NOTE — Telephone Encounter (Signed)
Talked to [place] with [facility] [phone]. Pt c/o of limited motion left shoulder area. Has appt with pain management 09/08/14. Will continue to work with pt for OT and suggest non-medicine pain relief. Hilda Blades Addysin Porco RN 09/02/14 2:30PM

## 2014-09-04 DIAGNOSIS — F3131 Bipolar disorder, current episode depressed, mild: Secondary | ICD-10-CM | POA: Diagnosis not present

## 2014-09-04 DIAGNOSIS — I10 Essential (primary) hypertension: Secondary | ICD-10-CM | POA: Diagnosis not present

## 2014-09-04 DIAGNOSIS — G894 Chronic pain syndrome: Secondary | ICD-10-CM | POA: Diagnosis not present

## 2014-09-04 DIAGNOSIS — R0602 Shortness of breath: Secondary | ICD-10-CM | POA: Diagnosis not present

## 2014-09-04 DIAGNOSIS — J449 Chronic obstructive pulmonary disease, unspecified: Secondary | ICD-10-CM | POA: Diagnosis not present

## 2014-09-05 ENCOUNTER — Encounter: Payer: Self-pay | Admitting: Internal Medicine

## 2014-09-05 ENCOUNTER — Ambulatory Visit (INDEPENDENT_AMBULATORY_CARE_PROVIDER_SITE_OTHER): Payer: Medicare Other | Admitting: Internal Medicine

## 2014-09-05 VITALS — BP 134/62 | HR 85 | Temp 99.7°F | Ht 60.0 in | Wt 89.6 lb

## 2014-09-05 DIAGNOSIS — I10 Essential (primary) hypertension: Secondary | ICD-10-CM | POA: Diagnosis not present

## 2014-09-05 DIAGNOSIS — F3131 Bipolar disorder, current episode depressed, mild: Secondary | ICD-10-CM | POA: Diagnosis not present

## 2014-09-05 DIAGNOSIS — M810 Age-related osteoporosis without current pathological fracture: Secondary | ICD-10-CM

## 2014-09-05 DIAGNOSIS — Z23 Encounter for immunization: Secondary | ICD-10-CM | POA: Diagnosis not present

## 2014-09-05 DIAGNOSIS — G8929 Other chronic pain: Secondary | ICD-10-CM

## 2014-09-05 DIAGNOSIS — J449 Chronic obstructive pulmonary disease, unspecified: Secondary | ICD-10-CM | POA: Diagnosis not present

## 2014-09-05 DIAGNOSIS — F3130 Bipolar disorder, current episode depressed, mild or moderate severity, unspecified: Secondary | ICD-10-CM

## 2014-09-05 DIAGNOSIS — D509 Iron deficiency anemia, unspecified: Secondary | ICD-10-CM

## 2014-09-05 DIAGNOSIS — R112 Nausea with vomiting, unspecified: Secondary | ICD-10-CM

## 2014-09-05 DIAGNOSIS — R0602 Shortness of breath: Secondary | ICD-10-CM | POA: Diagnosis not present

## 2014-09-05 DIAGNOSIS — G894 Chronic pain syndrome: Secondary | ICD-10-CM | POA: Diagnosis not present

## 2014-09-05 DIAGNOSIS — J302 Other seasonal allergic rhinitis: Secondary | ICD-10-CM

## 2014-09-05 DIAGNOSIS — E559 Vitamin D deficiency, unspecified: Secondary | ICD-10-CM

## 2014-09-05 LAB — CBC
HCT: 34.5 % — ABNORMAL LOW (ref 36.0–46.0)
HEMOGLOBIN: 11.3 g/dL — AB (ref 12.0–15.0)
MCH: 30.7 pg (ref 26.0–34.0)
MCHC: 32.8 g/dL (ref 30.0–36.0)
MCV: 93.8 fL (ref 78.0–100.0)
MPV: 8.8 fL (ref 8.6–12.4)
Platelets: 422 10*3/uL — ABNORMAL HIGH (ref 150–400)
RBC: 3.68 MIL/uL — AB (ref 3.87–5.11)
RDW: 15.7 % — ABNORMAL HIGH (ref 11.5–15.5)
WBC: 9.4 10*3/uL (ref 4.0–10.5)

## 2014-09-05 NOTE — Assessment & Plan Note (Addendum)
Upon review of her most recent hospitalization, her hemoglobin remained stable at 9-10. She could not tolerate oral iron therapy and reported to me that she has been anemic for all her life and has been on oral iron supplements in the past though without much success given the GI intolerance. I offered her the option of parenteral iron and will repeat CBC, iron studies today. She denies any GI blood loss or other signs of active bleeding. We will also check H. pylori serum antigen.  ADDENDUM 09/10/2014  4:03 PM:  Iron/TIBC/Ferritin/ %Sat    Component Value Date/Time   IRON 29* 09/05/2014 1527   TIBC 456 09/05/2014 1527   FERRITIN 10 11/09/2013 0850   IRONPCTSAT 6* 09/05/2014 1527   IRONPCTSAT 4* 11/09/2013 0850   Still Fe deficient - will consider IV supplementation

## 2014-09-05 NOTE — Assessment & Plan Note (Signed)
She reports today she is now on Percocet 5/325 mg 1 tablet every 12 hours and feels that this does not adequately control her hip and back pain. I explained to her that opioids are not the best option when it comes to arthritic pain but deferred her chronic pain management to her pain clinic and offered to call them to which she expressed her appreciation.

## 2014-09-05 NOTE — Assessment & Plan Note (Addendum)
Upon reviewing the findings of her last scan, she expressed interest in repeating a scan to see where her bone stand and restarting alendronate therapy. Since she has been out of the SNF, she denies any falls or episodes of confusion like she did prior to her most recent hospitalization.  ADDENDUM 09/10/2014  4:02 PM:  Vit D 10 so will start ergocalciferol 50,000u x 8 tablets  ADDENDUM 10/12/2014  2:46 AM:  DEXA scan performed on 10/03/14 notable for scores consistent with osteoporosis. I will personally call the patient and advised her to start taking alendronate prior to her follow-up appointment on 10/17/14 to assess how well she is tolerating therapy.

## 2014-09-05 NOTE — Assessment & Plan Note (Signed)
She reports she has these symptoms throughout the day and cannot name any inciting factor. She does not feel it is related to when she eats and feels that some days she feels fine and other days she does not. Zofran does help alleviate her symptoms. She is able to eat usually 3-4 meals a day and does not feel postprandial fullness. She has not been taking Protonix since she has been out of the SNF.  I'm somewhat confused by her report of symptoms and is further complicated by her intermittent nonadherence to various treatments and therapies haven't tried in the past. She is very pleasant lady and will continue to follow her for these symptoms.

## 2014-09-05 NOTE — Progress Notes (Signed)
Patient ID: [name], female   DOB: [birth year], 64 y.o.   MRN: [id number]  I am completing paperwork today from [facility] that requires my signature:  Received 09/04/14: Notification that she may have some medication interactions between Abilify and Cymbalta along with Zofran and Zyprexa  Per my office note today, she denies that she is still taking Cymbalta or Zyprexa.

## 2014-09-05 NOTE — Assessment & Plan Note (Signed)
She is agreeable to getting her immunization today.

## 2014-09-05 NOTE — Assessment & Plan Note (Addendum)
Her most recent auscultation, Cymbalta and Zyprexa were discontinued, and she was started on Abilify 5 mg twice daily. She reports to me that she has discarded her old medications is continuing to take Abilify. Initially she denied that she was taking Lamictal, but after I told her that she was continued on his medication should still be on it, she reported to me that she is still taking it. She denies any audiovisual hallucinations, suicidal ideations, homicidal ideation.

## 2014-09-05 NOTE — Progress Notes (Signed)
   Subjective:    Patient ID: [name], female    DOB: 13-Jul-1950, 64 y.o.   MRN: [id number]  HPI [name] is a 64 year old female with end-stage COPD, iron deficiency anemia, osteoporosis who presents today for follow-up visit.   Of note, she was discharged from her SNF on Wednesday in essence returned home. She is being seen by home OT/PT/RN 1-2 times weekly and is continuing to be seen by her home health aide daily. She is looking into moving into an assisted living facility.  Please see assessment & plan for documentation of each problem.   Review of Systems  Respiratory: Positive for cough and shortness of breath. Negative for wheezing.   Cardiovascular: Negative for chest pain and leg swelling.  Gastrointestinal: Positive for nausea and vomiting. Negative for abdominal pain, diarrhea and blood in stool.  Genitourinary: Negative for dysuria.  Neurological: Negative for dizziness.       Objective:   Physical Exam  Constitutional: Thin cachectic Caucasian female No distress. Wearing nasal cannula.  HENT:  Head: Normocephalic and atraumatic.  Eyes: Conjunctivae are normal without pallor. Pupils are equal, round, and reactive to light.  Cardiovascular: Normal rate, regular rhythm and normal heart sounds.  Exam reveals no gallop and no friction rub.   No murmur heard. Pulmonary/Chest: Poor airflow bilaterally.  Abdominal: Soft. Mildly distended. Normoactive bowel sounds. No tenderness or guarding noted.  Neurological: She is alert and oriented to person, place, and time.  Skin: Skin is warm and dry. She is not diaphoretic.  Psychiatric: Her behavior is normal.         Assessment & Plan:

## 2014-09-05 NOTE — Assessment & Plan Note (Signed)
She reported to me that Zyrtec no longer works for her like to switch back to Claritin.

## 2014-09-06 LAB — COMPLETE METABOLIC PANEL WITH GFR
ALBUMIN: 4.2 g/dL (ref 3.5–5.2)
ALK PHOS: 92 U/L (ref 39–117)
ALT: 14 U/L (ref 0–35)
AST: 18 U/L (ref 0–37)
BUN: 13 mg/dL (ref 6–23)
CALCIUM: 8.7 mg/dL (ref 8.4–10.5)
CHLORIDE: 103 meq/L (ref 96–112)
CO2: 27 mEq/L (ref 19–32)
Creat: 0.65 mg/dL (ref 0.50–1.10)
GFR, Est African American: >89 mL/min
GFR, Est Non African American: >89 mL/min
Glucose, Bld: 94 mg/dL (ref 70–99)
Potassium: 4.8 mEq/L (ref 3.5–5.3)
Sodium: 143 mEq/L (ref 135–145)
Total Bilirubin: 0.1 mg/dL — ABNORMAL LOW (ref 0.2–1.2)
Total Protein: 7 g/dL (ref 6.0–8.3)

## 2014-09-06 LAB — IRON AND TIBC
%SAT: 6 % — AB (ref 20–55)
Iron: 29 ug/dL — ABNORMAL LOW (ref 42–145)
TIBC: 456 ug/dL (ref 250–470)
UIBC: 427 ug/dL — AB (ref 125–400)

## 2014-09-06 LAB — VITAMIN D 25 HYDROXY (VIT D DEFICIENCY, FRACTURES): VIT D 25 HYDROXY: 10 ng/mL — AB (ref 30–100)

## 2014-09-08 ENCOUNTER — Other Ambulatory Visit: Payer: Self-pay | Admitting: Internal Medicine

## 2014-09-08 DIAGNOSIS — R0602 Shortness of breath: Secondary | ICD-10-CM | POA: Diagnosis not present

## 2014-09-08 DIAGNOSIS — G894 Chronic pain syndrome: Secondary | ICD-10-CM | POA: Diagnosis not present

## 2014-09-08 DIAGNOSIS — F3131 Bipolar disorder, current episode depressed, mild: Secondary | ICD-10-CM | POA: Diagnosis not present

## 2014-09-08 DIAGNOSIS — F3130 Bipolar disorder, current episode depressed, mild or moderate severity, unspecified: Secondary | ICD-10-CM

## 2014-09-08 DIAGNOSIS — J449 Chronic obstructive pulmonary disease, unspecified: Secondary | ICD-10-CM | POA: Diagnosis not present

## 2014-09-08 DIAGNOSIS — I10 Essential (primary) hypertension: Secondary | ICD-10-CM | POA: Diagnosis not present

## 2014-09-09 DIAGNOSIS — M25512 Pain in left shoulder: Secondary | ICD-10-CM | POA: Diagnosis not present

## 2014-09-09 DIAGNOSIS — M545 Low back pain: Secondary | ICD-10-CM | POA: Diagnosis not present

## 2014-09-09 DIAGNOSIS — M25552 Pain in left hip: Secondary | ICD-10-CM | POA: Diagnosis not present

## 2014-09-09 DIAGNOSIS — Z79891 Long term (current) use of opiate analgesic: Secondary | ICD-10-CM | POA: Diagnosis not present

## 2014-09-09 DIAGNOSIS — G8929 Other chronic pain: Secondary | ICD-10-CM | POA: Diagnosis not present

## 2014-09-09 NOTE — Telephone Encounter (Signed)
I called her pharmacy. It appears she was receiving this medication from her mental health providers at [facility], but her insurance is no longer covered by them. As such, I instructed the pharmacist to provide her with another 30 day refill. She last refilled this medication on 08/27/14 for 14 tablets which appears appropriate as she would be due to run out of this medication tomorrow.

## 2014-09-09 NOTE — Progress Notes (Signed)
INTERNAL MEDICINE TEACHING ATTENDING ADDENDUM - Keishia Ground, MD: I reviewed and discussed at the time of visit with the resident Dr. Patel, the patient's medical history, physical examination, diagnosis and results of pertinent tests and treatment and I agree with the patient's care as documented.  

## 2014-09-10 ENCOUNTER — Encounter: Payer: Self-pay | Admitting: Internal Medicine

## 2014-09-10 DIAGNOSIS — I10 Essential (primary) hypertension: Secondary | ICD-10-CM | POA: Diagnosis not present

## 2014-09-10 DIAGNOSIS — J449 Chronic obstructive pulmonary disease, unspecified: Secondary | ICD-10-CM | POA: Diagnosis not present

## 2014-09-10 DIAGNOSIS — G894 Chronic pain syndrome: Secondary | ICD-10-CM | POA: Diagnosis not present

## 2014-09-10 DIAGNOSIS — F3131 Bipolar disorder, current episode depressed, mild: Secondary | ICD-10-CM | POA: Diagnosis not present

## 2014-09-10 DIAGNOSIS — R0602 Shortness of breath: Secondary | ICD-10-CM | POA: Diagnosis not present

## 2014-09-10 MED ORDER — VITAMIN D (ERGOCALCIFEROL) 1.25 MG (50000 UNIT) PO CAPS: 50000.0000 [IU] | ORAL_CAPSULE | ORAL | Status: DC

## 2014-09-10 NOTE — Telephone Encounter (Signed)
[name] with [facility] calling about patients pain in back / hips and lt shoulder. Pt refuses to go to ED. Pt not happy with pain management clinic she feels they are not doing there job.

## 2014-09-10 NOTE — Progress Notes (Unsigned)
Patient ID: [name], female   DOB: 12/12/1950, 64 y.o.   MRN: [id number]  I am completing paperwork today from [facility] that requires my signature:  Received 09/08/14: authorization for home health nursing, PT, OT orders

## 2014-09-10 NOTE — Telephone Encounter (Signed)
Rx called in to pharmacy. 

## 2014-09-10 NOTE — Assessment & Plan Note (Signed)
ADDENDUM 09/10/2014  4:02 PM:  Vit D 10 so will start ergocalciferol 50,000u x 8 tablets

## 2014-09-10 NOTE — Addendum Note (Signed)
Addended by: Riccardo Dubin on: 09/10/2014 01:51 PM   Modules accepted: Orders

## 2014-09-10 NOTE — Telephone Encounter (Signed)
Message left on ID recording for Jenny Reichmann [phone] 3:10PM and 4:05PM 0n 09/10/14.

## 2014-09-10 NOTE — Addendum Note (Signed)
Addended by: Riccardo Dubin on: 09/10/2014 04:04 PM   Modules accepted: Orders

## 2014-09-11 DIAGNOSIS — J449 Chronic obstructive pulmonary disease, unspecified: Secondary | ICD-10-CM | POA: Diagnosis not present

## 2014-09-11 DIAGNOSIS — R0602 Shortness of breath: Secondary | ICD-10-CM | POA: Diagnosis not present

## 2014-09-11 DIAGNOSIS — F3131 Bipolar disorder, current episode depressed, mild: Secondary | ICD-10-CM | POA: Diagnosis not present

## 2014-09-11 DIAGNOSIS — G894 Chronic pain syndrome: Secondary | ICD-10-CM | POA: Diagnosis not present

## 2014-09-11 DIAGNOSIS — I10 Essential (primary) hypertension: Secondary | ICD-10-CM | POA: Diagnosis not present

## 2014-09-11 NOTE — Telephone Encounter (Signed)
Talked with pt after talking to [facility] at [place] [phone] about multi pain areas. Pt is upset with the pain clinic she is going to - she states not helping with her pain. Pt is aware C. Cyndi Bender will call pt to sch an appt - she prefers AM appt. Logun Colavito RN 09/11/14 10AM

## 2014-09-16 ENCOUNTER — Telehealth: Payer: Self-pay | Admitting: Licensed Clinical Social Worker

## 2014-09-16 ENCOUNTER — Telehealth: Payer: Self-pay | Admitting: Internal Medicine

## 2014-09-16 DIAGNOSIS — M542 Cervicalgia: Secondary | ICD-10-CM | POA: Diagnosis not present

## 2014-09-16 DIAGNOSIS — Z79891 Long term (current) use of opiate analgesic: Secondary | ICD-10-CM | POA: Diagnosis not present

## 2014-09-16 DIAGNOSIS — M545 Low back pain: Secondary | ICD-10-CM | POA: Diagnosis not present

## 2014-09-16 DIAGNOSIS — G890 Central pain syndrome: Secondary | ICD-10-CM | POA: Diagnosis not present

## 2014-09-16 DIAGNOSIS — M546 Pain in thoracic spine: Secondary | ICD-10-CM | POA: Diagnosis not present

## 2014-09-16 NOTE — Telephone Encounter (Signed)
[name] was referred to CSW for referral to new psychiatrist.  Physician was under the impression [facility] was no longer a provider under pt's insurance.  However, upon checking the [place] is listed as a provider.  CSW placed called to pt.  CSW left message requesting return call. CSW provided contact hours and phone number.  Pt is scheduled for an appointment at [facility] tomorrow, 09/17/14.  CSW will follow up with [name] during the appointment to inquire if pt needs new psychiatrist or wants to return to [facility].

## 2014-09-16 NOTE — Telephone Encounter (Signed)
Call to patient to confirm appointment for 09/17/14 at 8:30 lmtcb

## 2014-09-17 ENCOUNTER — Ambulatory Visit (HOSPITAL_COMMUNITY)
Admission: RE | Admit: 2014-09-17 | Discharge: 2014-09-17 | Disposition: A | Discharge status: Home / Self Care | Payer: Medicare Other | Source: Ambulatory Visit | Attending: Internal Medicine | Admitting: Internal Medicine

## 2014-09-17 ENCOUNTER — Encounter: Payer: Self-pay | Admitting: Internal Medicine

## 2014-09-17 ENCOUNTER — Ambulatory Visit (INDEPENDENT_AMBULATORY_CARE_PROVIDER_SITE_OTHER): Payer: Medicare Other | Admitting: Internal Medicine

## 2014-09-17 VITALS — BP 148/69 | HR 73 | Temp 98.5°F | Ht 60.0 in

## 2014-09-17 DIAGNOSIS — J449 Chronic obstructive pulmonary disease, unspecified: Secondary | ICD-10-CM | POA: Diagnosis not present

## 2014-09-17 DIAGNOSIS — Z9981 Dependence on supplemental oxygen: Secondary | ICD-10-CM

## 2014-09-17 DIAGNOSIS — M25552 Pain in left hip: Secondary | ICD-10-CM | POA: Diagnosis not present

## 2014-09-17 DIAGNOSIS — M8580 Other specified disorders of bone density and structure, unspecified site: Secondary | ICD-10-CM | POA: Diagnosis not present

## 2014-09-17 DIAGNOSIS — R21 Rash and other nonspecific skin eruption: Secondary | ICD-10-CM | POA: Diagnosis not present

## 2014-09-17 DIAGNOSIS — I10 Essential (primary) hypertension: Secondary | ICD-10-CM | POA: Diagnosis not present

## 2014-09-17 DIAGNOSIS — M549 Dorsalgia, unspecified: Secondary | ICD-10-CM | POA: Diagnosis not present

## 2014-09-17 DIAGNOSIS — R0602 Shortness of breath: Secondary | ICD-10-CM | POA: Diagnosis not present

## 2014-09-17 DIAGNOSIS — M810 Age-related osteoporosis without current pathological fracture: Secondary | ICD-10-CM

## 2014-09-17 DIAGNOSIS — G8929 Other chronic pain: Secondary | ICD-10-CM

## 2014-09-17 DIAGNOSIS — G894 Chronic pain syndrome: Secondary | ICD-10-CM | POA: Diagnosis not present

## 2014-09-17 DIAGNOSIS — D509 Iron deficiency anemia, unspecified: Secondary | ICD-10-CM

## 2014-09-17 DIAGNOSIS — F3131 Bipolar disorder, current episode depressed, mild: Secondary | ICD-10-CM | POA: Diagnosis not present

## 2014-09-17 DIAGNOSIS — R112 Nausea with vomiting, unspecified: Secondary | ICD-10-CM

## 2014-09-17 MED ORDER — FERROUS SULFATE 325 (65 FE) MG PO TABS: 325.0000 mg | ORAL_TABLET | Freq: Every day | ORAL | Status: DC

## 2014-09-17 MED ORDER — HYDROCORTISONE 1 % EX CREA: TOPICAL_CREAM | CUTANEOUS | Status: DC

## 2014-09-17 NOTE — Assessment & Plan Note (Signed)
Since beginning Zofran, the patient reports her nausea and vomiting has completely resolved. She also says she is now taking her Protonix as well. We will continue these medications as is.

## 2014-09-17 NOTE — Assessment & Plan Note (Signed)
She is complaining of left hip pain and physical exam is limited due to pain. She denies any falls in the last year. She has agreed to x-ray of the left hip today and results show no acute fracture or subluxation with diffuse osteopenia. We will consider referral to sports medicine.

## 2014-09-17 NOTE — Assessment & Plan Note (Addendum)
Patient did not have her Vitamin D prescription filled because of cost. We will try to find a cheaper alternative, perhaps lower dose, as she is willing to take Vitamin D as long as it is affordable. She is scheduled for DEXA scan on 09/30/14.

## 2014-09-17 NOTE — Assessment & Plan Note (Signed)
Her recent iron studies show that she is iron deficient. Patient reports that she has taken iron supplementation in the past but had difficulty with tolerance. She says she is open to attempting iron supplementation again at a lower or once a day/every other day regimen. We will start Ferrous Sulfate 325 mg po daily or every other day and follow up on how she is tolerating.

## 2014-09-17 NOTE — Assessment & Plan Note (Signed)
Patient's current pain management doctor has increased her Percocet/Roxicet from 5-325 mg to 10-325 mg. She feels this is an improvement, but is unhappy with her current pain management clinic and would like a referral to another pain clinic. We discussed that this may be a long process and we cannot guarantee acceptance by any clinic. She understands and would still like [place] to begin the process of referral to new pain management clinic.

## 2014-09-17 NOTE — Progress Notes (Signed)
Internal Medicine Clinic Attending  I saw and evaluated the patient.  I personally confirmed the key portions of the history and exam documented by Dr. Zada Finders and I reviewed pertinent patient test results.  The assessment, diagnosis, and plan were formulated together and I agree with the documentation in the resident's note.  64 year old female with COPD on home oxygen comes for a rash on her right forearm, follow up on chronic pain (left hip and back) and request to change pain clinic as she is not happy there. Right forearm rash looks like insect bite versus contact allergy (no vesicles), has not progressed since it appeared a few days back, is itchy and does not seem spreading from one place to another. Hydrocortisone 1% to be prescribed for it. Left hip pain appears to be excruciating for the patient, brought on by slight manipulation, present on palpation however no overlying skin changes or swelling. X ray left hip today. Based on the findings, we will refer to [facility] or ortho.  Madilyn Fireman MD MPH 09/17/2014 10:04 AM

## 2014-09-17 NOTE — Progress Notes (Signed)
Patient ID: [name], female   DOB: 11-11-1950, 64 y.o.   MRN: [id number]   Subjective:   Patient ID: [name] female   DOB: 1950/07/04 64 y.o.   MRN: [id number]  HPI: Ms.Evanie Lemmie Evens [name] is a 64 y.o. female with PMH of COPD, osteoporosis, and iron deficiency anemia here for a follow up visit. Her main concern today is for referral to a new pain management clinic as she is unhappy with her current provider. She also complains of a new rash that is pruritic that began 2 days ago and denies any contact with allergens/plants or new detergents and soap use. She was recently prescribed Vitamin D 50,000 Units for Vit. D deficiency, but did not fill her prescription due to cost. She also has complaints of chronic left hip pain. Her nausea and vomiting from earlier visit is completely improved with Zofran and Protonix. She has PT/OT with Britt Boozer and weekly nurse visits.     Past Medical History  Diagnosis Date  . Depression   . Suicidal ideation 2007     attempted overdose 2012  Dr. Tivis Ringer report  . Bipolar disorder   . Substance abuse      narcotics, alcohol, tobacco  . Chronic pain syndrome     follows at [facility]  . DEFICIENCY, VITAMIN D NOS 01/03/2007  . Chronic lower back pain   . Anxiety   . OSTEOPOROSIS 06/17/2009    DEXA 05/2009 : L femur -2.9; R femur -2.5. Alendronate on med list but not taking. Needs addressed ASAP as h/o fractures.    . Elevated liver function tests   . On home oxygen therapy     "3L; 24/7" (03/20/2014)  . Shortness of breath     "all the time" (02/27/2013)  . 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 Sciatic pain   . Anemia   . Pneumonia X 1?  . Hyperthyroidism     "borderline"  . History of blood transfusion     "getting my 1st later tonight" (03/20/2014)  . Arthritis     "legs, back; hips, primarily left hip" (03/20/2014)   Current Outpatient Prescriptions  Medication Sig Dispense  Refill  . loratadine (CLARITIN) 10 MG tablet Take 10 mg by mouth daily.    . pantoprazole (PROTONIX) 40 MG tablet Take 1 tablet (40 mg total) by mouth 2 (two) times daily. 60 tablet 1  . ARIPiprazole (ABILIFY) 5 MG tablet Take 1 tablet (5 mg total) by mouth 2 (two) times daily. 60 tablet 0  . budesonide (PULMICORT) 0.5 MG/2ML nebulizer solution Take 2 mLs (0.5 mg total) by nebulization 2 (two) times daily. DX j43.8 120 mL 3  . calcium carbonate (TUMS - DOSED IN MG ELEMENTAL CALCIUM) 500 MG chewable tablet Chew 1 tablet (200 mg of elemental calcium total) by mouth 3 (three) times daily with meals. 90 tablet 0  . cetirizine (ZYRTEC) 10 MG tablet Take 10 mg by mouth daily.    . ferrous sulfate 325 (65 FE) MG tablet Take 1 tablet (325 mg total) by mouth daily. Take once a day or every other day, as tolerated. 30 tablet 3  . fluticasone (FLONASE) 50 MCG/ACT nasal spray Place 1 spray into both nostrils daily. 16 g 1  . Fluticasone-Salmeterol (ADVAIR DISKUS) 250-50 MCG/DOSE AEPB INHALE 1 PUFF INTO THE LUNGS 2 TIMES DAILY 5 each 6  . gabapentin (NEURONTIN) 300 MG capsule Take 300 mg by mouth  3 (three) times daily.     . hydrocortisone cream 1 % Apply to affected area 2 times daily 30 g 1  . ipratropium-albuterol (DUONEB) 0.5-2.5 (3) MG/3ML SOLN 4 times a day (breakfast, lunch, dinner, bedtime).Can take 2 additional treatments if needed on bad days. DX J43.8 360 mL 3  . lamoTRIgine (LAMICTAL) 100 MG tablet Take 100 mg by mouth daily.    . ondansetron (ZOFRAN ODT) 4 MG disintegrating tablet Take 1 tablet (4 mg total) by mouth every 8 (eight) hours as needed for nausea or vomiting. 40 tablet 0  . oxyCODONE-acetaminophen (PERCOCET/ROXICET) 5-325 MG per tablet Take 1 tablet by mouth every 12 (twelve) hours as needed for severe pain. 30 tablet 0  . polyethylene glycol (MIRALAX / GLYCOLAX) packet Take 17 g by mouth daily as needed for moderate constipation. (Patient not taking: Reported on 09/05/2014) 14 each 0  .  PROAIR HFA 108 (90 BASE) MCG/ACT inhaler INHALE ONE PUFF BY MOUTH EVERY 2 TO 4 HOURS AS NEEDED FOR WHEEZING 8.5 g 12  . Vitamin D, Ergocalciferol, (DRISDOL) 50000 UNITS CAPS capsule Take 1 capsule (50,000 Units total) by mouth every 7 (seven) days. 8 capsule 0  . zolpidem (AMBIEN) 5 MG tablet TAKE ONE TABLET AT BEDTIME AS NEEDED FOR SLEEP 30 tablet 0   No current facility-administered medications for this visit.   Family History  Problem Relation Age of Onset  . Stroke Neg Hx   . Cancer Neg Hx   . Myelodysplastic syndrome Father     Died from myelofibrosis though diagnosed post-mortem   History   Social History  . Marital Status: Divorced    Spouse Name: N/A  . Number of Children: 2  . Years of Education: N/A   Occupational History  . retired    Social History Main Topics  . Smoking status: Current Every Day Smoker -- 0.20 packs/day for 45 years    Types: Cigarettes  . Smokeless tobacco: Never Used     Comment: 03/20/2014 "smoking 2 cigarettes/day; that's down from 2 ppd"  . Alcohol Use: 0.0 oz/week    0 Standard drinks or equivalent per week     Comment: 03/20/2014 "nothing in 7 years; never had a problem w/it"  . Drug Use: No  . Sexual Activity: No   Other Topics Concern  . 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  HENT: Negative for congestion, ear discharge, ear pain, nosebleeds and sore throat.   Eyes: Negative for blurred vision and double vision.  Respiratory: Positive for cough, shortness of breath and wheezing.   Cardiovascular: Negative for chest pain, palpitations and leg swelling.  Gastrointestinal: Negative for nausea, vomiting, abdominal pain, diarrhea and  constipation.  Genitourinary: Negative for dysuria, urgency, frequency and hematuria.  Musculoskeletal: Positive for back pain and joint pain. Negative for falls.  Skin: Positive for itching and rash.  Neurological: Negative for dizziness, tingling, tremors, sensory change, speech change and headaches.  Endo/Heme/Allergies: Bruises/bleeds easily.   Objective:  Physical Exam: Filed Vitals:   09/17/14 0846  BP: 148/69  Pulse: 73  Temp: 98.5 F (36.9 C)  TempSrc:  Oral  Height: 5' (1.524 m)  SpO2: 98%   Physical Exam  Constitutional: She is oriented to person, place, and time. She is cooperative. Nasal cannula in place.  Thin, cachectic appearance.  HENT:  Head: Normocephalic and atraumatic.  Eyes: Conjunctivae are normal. Pupils are equal, round, and reactive to light.  Cardiovascular: Normal rate, regular rhythm, normal heart sounds and intact distal pulses.   Pulmonary/Chest: She has decreased breath sounds. She has no wheezes. She has no rhonchi. She has no rales.  Abdominal: Soft. Bowel sounds are normal. There is no tenderness. There is no guarding.  Musculoskeletal:       Right hip: She exhibits normal range of motion and no tenderness.       Left hip: She exhibits decreased range of motion and tenderness.  Neurological: She is alert and oriented to person, place, and time.  Skin: Skin is warm and dry. Bruising and rash noted. No petechiae noted. Rash is not pustular and not vesicular. There is erythema.   Assessment & Plan:  Please see problem list for current assessment and plan.

## 2014-09-17 NOTE — Patient Instructions (Signed)
Thank you for your visit.  1. For your pain management, we will begin the process of referral to a new pain management clinic. As we discussed, this may be a couple months process and we cannot guarantee approval by the pain clinic.  2. For your rash, we are prescribing a 1% hydrocortisone steroid cream. As we discussed, please use a small amount on skin to watch for any reaction. If no reaction, use as instructed.  3. For your left hip pain, today we will have an x-ray completed, and depending on results we will refer to either sports medicine or orthopedics.  4. For your osteoporosis, we will start smaller dose of Vitamin D 1000 - 2000 units, hopefully this will be a more affordable option.  5. For your iron deficiency, we will start iron supplementation. Please take once a day or once every other day as tolerated.

## 2014-09-18 NOTE — Telephone Encounter (Signed)
CSW placed call to [name] to follow up on psychiatry referral.  Pt states she was current with [facility] but then was told she would have a $70/copayment and no longer took her insurance.  [name] called her insurance and was informed [name] was in-network.  Pt states she did not know of any other in-network providers.  CSW utilized [facility]  [employer].  Referral made to [facility], pt will receive both therapy and med management.  Appointment scheduled for 09/23/14 at 900 with therapist for initial appointment.  [name] notified - message left with appointment date and time, letter mailed.  Ringer will contact pt one day prior to appt to confirm.

## 2014-09-19 ENCOUNTER — Ambulatory Visit: Payer: Self-pay | Admitting: Internal Medicine

## 2014-09-19 DIAGNOSIS — G894 Chronic pain syndrome: Secondary | ICD-10-CM | POA: Diagnosis not present

## 2014-09-19 DIAGNOSIS — J449 Chronic obstructive pulmonary disease, unspecified: Secondary | ICD-10-CM | POA: Diagnosis not present

## 2014-09-19 DIAGNOSIS — F3131 Bipolar disorder, current episode depressed, mild: Secondary | ICD-10-CM | POA: Diagnosis not present

## 2014-09-19 DIAGNOSIS — R0602 Shortness of breath: Secondary | ICD-10-CM | POA: Diagnosis not present

## 2014-09-19 DIAGNOSIS — I10 Essential (primary) hypertension: Secondary | ICD-10-CM | POA: Diagnosis not present

## 2014-09-22 DIAGNOSIS — G894 Chronic pain syndrome: Secondary | ICD-10-CM | POA: Diagnosis not present

## 2014-09-22 DIAGNOSIS — I10 Essential (primary) hypertension: Secondary | ICD-10-CM | POA: Diagnosis not present

## 2014-09-22 DIAGNOSIS — J449 Chronic obstructive pulmonary disease, unspecified: Secondary | ICD-10-CM | POA: Diagnosis not present

## 2014-09-22 DIAGNOSIS — R0602 Shortness of breath: Secondary | ICD-10-CM | POA: Diagnosis not present

## 2014-09-22 DIAGNOSIS — F3131 Bipolar disorder, current episode depressed, mild: Secondary | ICD-10-CM | POA: Diagnosis not present

## 2014-09-23 ENCOUNTER — Telehealth: Payer: Self-pay | Admitting: Licensed Clinical Social Worker

## 2014-09-23 ENCOUNTER — Other Ambulatory Visit: Payer: Self-pay | Admitting: Internal Medicine

## 2014-09-23 NOTE — Telephone Encounter (Signed)
Pt had left message on CSW voicemail requesting return call.  CSW placed called to pt.  CSW left message requesting return call. CSW provided contact hours and phone number.

## 2014-09-23 NOTE — Telephone Encounter (Signed)
Multiple issues found - -   Patient is no longer supposed to be taking Zyrtec, changed to Claritin per Dr. Alfonso Patten Patel's note.  She is no longer on duloxetine, should have been stopped after last hospital stay.   Our system has her on Advair, not symbicort.  This was confirmed at last PCP visit.   Can we find out which of the two she is on?   Will refill PEG, lamictal, abilify and zofran.  Will refuse others due to above.  Need to confirm which inhaler she has at home and cancer other prescriptions.

## 2014-09-24 DIAGNOSIS — J449 Chronic obstructive pulmonary disease, unspecified: Secondary | ICD-10-CM | POA: Diagnosis not present

## 2014-09-24 DIAGNOSIS — G894 Chronic pain syndrome: Secondary | ICD-10-CM | POA: Diagnosis not present

## 2014-09-24 DIAGNOSIS — I10 Essential (primary) hypertension: Secondary | ICD-10-CM | POA: Diagnosis not present

## 2014-09-24 DIAGNOSIS — R0602 Shortness of breath: Secondary | ICD-10-CM | POA: Diagnosis not present

## 2014-09-24 DIAGNOSIS — F3131 Bipolar disorder, current episode depressed, mild: Secondary | ICD-10-CM | POA: Diagnosis not present

## 2014-09-25 DIAGNOSIS — I10 Essential (primary) hypertension: Secondary | ICD-10-CM | POA: Diagnosis not present

## 2014-09-25 DIAGNOSIS — R0602 Shortness of breath: Secondary | ICD-10-CM | POA: Diagnosis not present

## 2014-09-25 DIAGNOSIS — F3131 Bipolar disorder, current episode depressed, mild: Secondary | ICD-10-CM | POA: Diagnosis not present

## 2014-09-25 DIAGNOSIS — G894 Chronic pain syndrome: Secondary | ICD-10-CM | POA: Diagnosis not present

## 2014-09-25 DIAGNOSIS — J449 Chronic obstructive pulmonary disease, unspecified: Secondary | ICD-10-CM | POA: Diagnosis not present

## 2014-09-26 ENCOUNTER — Encounter: Payer: Self-pay | Admitting: Internal Medicine

## 2014-09-26 NOTE — Progress Notes (Signed)
Patient ID: [name], female   DOB: 01-24-1951, 64 y.o.   MRN: [id number]  I am completing paperwork today from [facility] that requires my signature:  Received 09/18/14: Physician verbal order authorizing oxycodone 10 mg every 12 hours as needed for pain  Per [place], it appears she is now on an increased dose as prescribed by [facility]. She received oxycodone 10 mg 60 tablets on 09/16/14.

## 2014-09-30 ENCOUNTER — Ambulatory Visit (INDEPENDENT_AMBULATORY_CARE_PROVIDER_SITE_OTHER): Payer: Medicare Other | Admitting: Family Medicine

## 2014-09-30 ENCOUNTER — Other Ambulatory Visit: Payer: Self-pay

## 2014-09-30 ENCOUNTER — Encounter: Payer: Self-pay | Admitting: Family Medicine

## 2014-09-30 VITALS — BP 125/62 | Ht 61.0 in | Wt 85.0 lb

## 2014-09-30 DIAGNOSIS — M7062 Trochanteric bursitis, left hip: Secondary | ICD-10-CM | POA: Diagnosis not present

## 2014-10-01 ENCOUNTER — Telehealth: Payer: Self-pay | Admitting: Licensed Clinical Social Worker

## 2014-10-01 DIAGNOSIS — M7062 Trochanteric bursitis, left hip: Secondary | ICD-10-CM | POA: Insufficient documentation

## 2014-10-01 NOTE — Assessment & Plan Note (Signed)
Chronic left hip pain: Patient clinically with trochanteric bursitis. Already on maximal oral med pain control and is managed at pain management. XR reviewed from 09/17/2014 and does not show DJD of the left hip.  - Offered trochanteric bursa injection, but patient declined due to history of injections in the past that have failed to provide much relief.  -  Declined topical NSAID therapy.  - f/u PRN for trochanteric bursa injection.

## 2014-10-01 NOTE — Progress Notes (Signed)
[name] - 64 y.o. female MRN [id number]  Date of birth: 10-16-1950  CC: Left hip pain   SUBJECTIVE:   HPI  [name] is a pleasant 64yo female with multiple medical co-morbidities who presents with chronic left hip pain.  She also has chronic back pain. She is seen by a pain management specialist and is currently taking cymbalta, oxycodone, and gabapentin for her pain.  She has had no recent worsening of her hip pain.  She denies any recent falls (none in the last year).  The pain bothers her regardless of what activity she is doing.  The pain is particularly bothersome at night, whether or not she is laying on that side.  She has had injections into the hip before, and reports only minimal relief. Her last injection was over a year ago and she is unsure if it was into her trochanteric bursa or intra-articular.  She denies any fevers, chills, or night sweats.  She uses a walker to get around.   ROS:     Negative other than that in HPI.   HISTORY: Past Medical, Surgical, Social, and Family History Reviewed & Updated per EMR.     OBJECTIVE: BP 125/62 mmHg  Ht 5\' 1"  (1.549 m)  Wt 85 lb (38.556 kg)  BMI 16.07 kg/m2  Physical Exam  Gen: NAD, sitting comfortably in chair with home oxygen.   Resp: non-labored.  Speaking with ease. Hip: Exam quite limited due to patient's agility and dexterity.  - IR: Left side 20 Deg, Left ER: 20 Deg, Left Flexion: 95 Deg, Left Extension: 10 Deg,    IR: right side 30 Deg, right ER: 30 Deg, right flexion 105 Deg, right Extension: 5 Deg,         Unable to perform abduction and adduction seated in chair.  - Strength: symmetric - Appears steady with walker. Standing hip rotation and gait without trendelenburg / unsteadiness. - Significant pain over greater trochanter with even light palpation.  X-rays: 3 view XR reviewed of the left hip from 09/17/2014.  Views include b/l AP, Left AP, and Left Frog leg.  Minimal to no joint space narrowing is appreciated.   No spurring.     MEDICATIONS, LABS & OTHER ORDERS: Previous Medications   ARIPIPRAZOLE (ABILIFY) 10 MG TABLET    TAKE 1/2 TABLET TWICE DAILY   BUDESONIDE (PULMICORT) 0.5 MG/2ML NEBULIZER SOLUTION    Take 2 mLs (0.5 mg total) by nebulization 2 (two) times daily. DX j43.8   CALCIUM CARBONATE (TUMS - DOSED IN MG ELEMENTAL CALCIUM) 500 MG CHEWABLE TABLET    CHEW ONE TABLET THREE TIMES DAILY WITH MEALS   CEFDINIR (OMNICEF) 300 MG CAPSULE       CETIRIZINE (ZYRTEC) 10 MG TABLET    Take 10 mg by mouth daily.   DULOXETINE (CYMBALTA) 30 MG CAPSULE       FERROUS SULFATE 325 (65 FE) MG TABLET    Take 1 tablet (325 mg total) by mouth daily. Take once a day or every other day, as tolerated.   FLUTICASONE (FLONASE) 50 MCG/ACT NASAL SPRAY    Place 1 spray into both nostrils daily.   FLUTICASONE-SALMETEROL (ADVAIR DISKUS) 250-50 MCG/DOSE AEPB    INHALE 1 PUFF INTO THE LUNGS 2 TIMES DAILY   GABAPENTIN (NEURONTIN) 300 MG CAPSULE    Take 300 mg by mouth 3 (three) times daily.    HYDROCORTISONE CREAM 1 %    Apply to affected area 2 times daily   IPRATROPIUM-ALBUTEROL (DUONEB)  0.5-2.5 (3) MG/3ML SOLN    4 times a day (breakfast, lunch, dinner, bedtime).Can take 2 additional treatments if needed on bad days. DX J43.8   LAMOTRIGINE (LAMICTAL) 100 MG TABLET    TAKE ONE TABLET EVERY DAY   LORATADINE (CLARITIN) 10 MG TABLET    Take 10 mg by mouth daily.   NAPROXEN (NAPROSYN) 500 MG TABLET       OLANZAPINE (ZYPREXA) 5 MG TABLET       ONDANSETRON (ZOFRAN ODT) 4 MG DISINTEGRATING TABLET    Take 1 tablet (4 mg total) by mouth every 8 (eight) hours as needed for nausea or vomiting.   ONDANSETRON (ZOFRAN) 4 MG TABLET    TAKE ONE TABLET EVERY EIGHT HOURS AS NEEDED FOR NAUSEA AND VOMITING   OXYCODONE HCL 10 MG TABS       OXYCODONE-ACETAMINOPHEN (PERCOCET/ROXICET) 5-325 MG PER TABLET    Take 1 tablet by mouth every 12 (twelve) hours as needed for severe pain.   PANTOPRAZOLE (PROTONIX) 40 MG TABLET    Take 1 tablet (40 mg  total) by mouth 2 (two) times daily.   POLYETHYLENE GLYCOL POWDER (GLYCOLAX/MIRALAX) POWDER    TAKE 17G DISSOLVED IN 8OZ WATER DAILY ASNEEDED FOR MODERATE CONSTIPATION   PREDNISONE (DELTASONE) 10 MG TABLET       PROAIR HFA 108 (90 BASE) MCG/ACT INHALER    INHALE ONE PUFF BY MOUTH EVERY 2 TO 4 HOURS AS NEEDED FOR WHEEZING   SYMBICORT 160-4.5 MCG/ACT INHALER       VITAMIN D, ERGOCALCIFEROL, (DRISDOL) 50000 UNITS CAPS CAPSULE    Take 1 capsule (50,000 Units total) by mouth every 7 (seven) days.   ZOLPIDEM (AMBIEN) 5 MG TABLET    TAKE ONE TABLET AT BEDTIME AS NEEDED FOR SLEEP   Modified Medications   No medications on file   New Prescriptions   No medications on file   Discontinued Medications   No medications on file  No orders of the defined types were placed in this encounter.   ASSESSMENT & PLAN: See problem based charting & AVS for pt instructions.

## 2014-10-01 NOTE — Telephone Encounter (Signed)
[facility], providers accepting new patients limited to [place].  Pt notified and informed CSW Beverly Sessions states they do not accept [facility] Medicare.  Referral faxed to [facility].  Contact information provided to [name].

## 2014-10-01 NOTE — Telephone Encounter (Signed)
During this conversation, pt states she has found an [place].  Nanine Means coming out for an assessment.  CSW informed [name] to notify [facility] should any needed paperwork for placement.

## 2014-10-01 NOTE — Telephone Encounter (Signed)
[name] placed call to CSW this morning and states she received CSW telephone call but was unable to follow up until today.  [name] requesting the name, address and phone number of agency pt was referred to for mental health services.  CSW informed [name], letter was sent out with information and provided information to [name] over the phone.  Pt states she will contact [facility] and attempt to reschedule missed appointment.

## 2014-10-03 ENCOUNTER — Ambulatory Visit
Admission: RE | Admit: 2014-10-03 | Discharge: 2014-10-03 | Disposition: A | Discharge status: Home / Self Care | Payer: Medicare Other | Source: Ambulatory Visit | Attending: Internal Medicine | Admitting: Internal Medicine

## 2014-10-03 ENCOUNTER — Encounter: Payer: Self-pay | Admitting: Internal Medicine

## 2014-10-03 DIAGNOSIS — M810 Age-related osteoporosis without current pathological fracture: Secondary | ICD-10-CM | POA: Diagnosis not present

## 2014-10-03 NOTE — Progress Notes (Signed)
Patient ID: [name], female   DOB: 1950/04/18, 64 y.o.   MRN: [id number]  I am completing paperwork today from [facility] that requires my signature:  Received 09/29/14: Discharge from home health/OT/PT on 09/25/14  At her last office visit with her, she reported to me that she felt the services were helpful. At her following appointment, I will discuss with her her feelings completing therapy.

## 2014-10-07 DIAGNOSIS — M25512 Pain in left shoulder: Secondary | ICD-10-CM | POA: Diagnosis not present

## 2014-10-07 DIAGNOSIS — M25552 Pain in left hip: Secondary | ICD-10-CM | POA: Diagnosis not present

## 2014-10-07 DIAGNOSIS — Z79891 Long term (current) use of opiate analgesic: Secondary | ICD-10-CM | POA: Diagnosis not present

## 2014-10-07 DIAGNOSIS — M545 Low back pain: Secondary | ICD-10-CM | POA: Diagnosis not present

## 2014-10-07 DIAGNOSIS — G8929 Other chronic pain: Secondary | ICD-10-CM | POA: Diagnosis not present

## 2014-10-12 DIAGNOSIS — J449 Chronic obstructive pulmonary disease, unspecified: Secondary | ICD-10-CM | POA: Diagnosis not present

## 2014-10-13 ENCOUNTER — Telehealth: Payer: Self-pay | Admitting: Internal Medicine

## 2014-10-13 NOTE — Telephone Encounter (Signed)
  Reason for call:   I placed an outgoing call to [name] at 5:23 PM regarding her recent DEXA scan results .   Assessment/ Plan:   Advised the patient that her DEXA scan showed osteoporosis and that she has had a high risk for fractures which could result in prolonged hospitalization and diminished quality of life should they occur  She reports to me that she has been taking her vitamin D supplements since I prescribed it  I explained to her the and instructions for taking this medication: 30 minutes before meal, sitting upright.  She reported to me that she will be receiving her next medications from her pharmacy this Friday, 8/5. I told her that upon reviewing her vitamin D level with another provider, I will decide to prescribe bisphosphonate therapy to her.  She acknowledges understanding of my instructions and looks forward to for her follow-up appointment.  As always, pt is advised that if symptoms worsen or new symptoms arise, they should go to an urgent care facility or to to ER for further evaluation.   Riccardo Dubin, MD   10/13/2014, 5:32 PM

## 2014-10-16 ENCOUNTER — Other Ambulatory Visit: Payer: Self-pay | Admitting: Internal Medicine

## 2014-10-16 DIAGNOSIS — M810 Age-related osteoporosis without current pathological fracture: Secondary | ICD-10-CM

## 2014-10-16 DIAGNOSIS — J449 Chronic obstructive pulmonary disease, unspecified: Secondary | ICD-10-CM | POA: Diagnosis not present

## 2014-10-16 MED ORDER — ALENDRONATE SODIUM 70 MG PO TABS: 70.0000 mg | ORAL_TABLET | ORAL | Status: DC

## 2014-10-17 ENCOUNTER — Encounter: Payer: Self-pay | Admitting: Internal Medicine

## 2014-10-23 ENCOUNTER — Ambulatory Visit (INDEPENDENT_AMBULATORY_CARE_PROVIDER_SITE_OTHER): Payer: Medicare Other | Admitting: Internal Medicine

## 2014-10-23 ENCOUNTER — Encounter: Payer: Self-pay | Admitting: Internal Medicine

## 2014-10-23 VITALS — BP 139/56 | HR 62 | Temp 99.1°F | Ht 60.0 in | Wt 89.0 lb

## 2014-10-23 DIAGNOSIS — F17210 Nicotine dependence, cigarettes, uncomplicated: Secondary | ICD-10-CM

## 2014-10-23 DIAGNOSIS — F319 Bipolar disorder, unspecified: Secondary | ICD-10-CM

## 2014-10-23 DIAGNOSIS — Z79899 Other long term (current) drug therapy: Secondary | ICD-10-CM | POA: Diagnosis not present

## 2014-10-23 DIAGNOSIS — G8929 Other chronic pain: Secondary | ICD-10-CM | POA: Diagnosis not present

## 2014-10-23 DIAGNOSIS — R112 Nausea with vomiting, unspecified: Secondary | ICD-10-CM

## 2014-10-23 DIAGNOSIS — F17200 Nicotine dependence, unspecified, uncomplicated: Secondary | ICD-10-CM

## 2014-10-23 MED ORDER — ONDANSETRON HCL 4 MG PO TABS: ORAL_TABLET | ORAL | Status: DC

## 2014-10-23 NOTE — Progress Notes (Signed)
Patient ID: [name], female   DOB: [birth year], 64 y.o.   MRN: [id number]   Subjective:   Patient ID: [name] female   DOB: 25-Nov-1950 64 y.o.   MRN: [id number]  HPI: [name] [name] is a 64 y.o. female with a PMH of depression with SI, bipolar disorder, substance abuse, COPD here today complaining of not feeling well for the past two days. She reports that she feels tired and achy and "just doesn't feel good." She cannot provide any further details. She denies any fevers, chills, nausea, vomiting, diarrhea, dysuria, sore throat, chest pain. She notes worsening of her shortness of breath over the past several months with warmer weather but no change in the past two days. Denies any cough or sputum production.   She denies any recent changes in her medications and reports she is taking everything as prescribed. She did not bring all of her prescriptions in to clinic today but does have a few that she is requesting refills for today. One of the medications, Cymbata, was stopped at her last visit. She is unable to tell me what medications she is currently taking at home.   Past Medical History  Diagnosis Date  . Depression   . Suicidal ideation 2007     attempted overdose 2012  Dr. Tivis Ringer report  . Bipolar disorder   . Substance abuse      narcotics, alcohol, tobacco  . Chronic pain syndrome     follows at [facility]  . DEFICIENCY, VITAMIN D NOS 01/03/2007  . Chronic lower back pain   . Anxiety   . OSTEOPOROSIS 06/17/2009    DEXA 05/2009 : L femur -2.9; R femur -2.5. Alendronate on med list but not taking. Needs addressed ASAP as h/o fractures.    . Elevated liver function tests   . On home oxygen therapy     "3L; 24/7" (03/20/2014)  . Shortness of breath     "all the time" (02/27/2013)  . 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 Sciatic pain   . Anemia   . Pneumonia X 1?  . Hyperthyroidism     "borderline"  .  History of blood transfusion     "getting my 1st later tonight" (03/20/2014)  . Arthritis     "legs, back; hips, primarily left hip" (03/20/2014)   Current Outpatient Prescriptions  Medication Sig Dispense Refill  . alendronate (FOSAMAX) 70 MG tablet Take 1 tablet (70 mg total) by mouth once a week. Take with a full glass of water on an empty stomach. 4 tablet 0  . ARIPiprazole (ABILIFY) 10 MG tablet TAKE 1/2 TABLET TWICE DAILY 30 tablet 2  . budesonide (PULMICORT) 0.5 MG/2ML nebulizer solution Take 2 mLs (0.5 mg total) by nebulization 2 (two) times daily. DX j43.8 120 mL 3  . calcium carbonate (TUMS - DOSED IN MG ELEMENTAL CALCIUM) 500 MG chewable tablet CHEW ONE TABLET THREE TIMES DAILY WITH MEALS 90 tablet 1  . cefdinir (OMNICEF) 300 MG capsule     . cetirizine (ZYRTEC) 10 MG tablet Take 10 mg by mouth daily.    . DULoxetine (CYMBALTA) 30 MG capsule     . ferrous sulfate 325 (65 FE) MG tablet Take 1 tablet (325 mg total) by mouth daily. Take once a day or every other day, as tolerated. 30 tablet 3  . fluticasone (FLONASE) 50 MCG/ACT nasal spray Place 1 spray into both nostrils daily. [place]  g 1  . Fluticasone-Salmeterol (ADVAIR DISKUS) 250-50 MCG/DOSE AEPB INHALE 1 PUFF INTO THE LUNGS 2 TIMES DAILY 5 each 6  . gabapentin (NEURONTIN) 300 MG capsule Take 300 mg by mouth 3 (three) times daily.     . hydrocortisone cream 1 % Apply to affected area 2 times daily 30 g 1  . ipratropium-albuterol (DUONEB) 0.5-2.5 (3) MG/3ML SOLN 4 times a day (breakfast, lunch, dinner, bedtime).Can take 2 additional treatments if needed on bad days. DX J43.8 360 mL 3  . lamoTRIgine (LAMICTAL) 100 MG tablet TAKE ONE TABLET EVERY DAY 30 tablet 2  . loratadine (CLARITIN) 10 MG tablet Take 10 mg by mouth daily.    . naproxen (NAPROSYN) 500 MG tablet     . OLANZapine (ZYPREXA) 5 MG tablet     . ondansetron (ZOFRAN ODT) 4 MG disintegrating tablet Take 1 tablet (4 mg total) by mouth every 8 (eight) hours as needed for nausea or  vomiting. 40 tablet 0  . ondansetron (ZOFRAN) 4 MG tablet TAKE ONE TABLET EVERY EIGHT HOURS AS NEEDED FOR NAUSEA AND VOMITING 90 tablet 2  . Oxycodone HCl 10 MG TABS     . oxyCODONE-acetaminophen (PERCOCET/ROXICET) 5-325 MG per tablet Take 1 tablet by mouth every 12 (twelve) hours as needed for severe pain. 30 tablet 0  . pantoprazole (PROTONIX) 40 MG tablet Take 1 tablet (40 mg total) by mouth 2 (two) times daily. 60 tablet 1  . polyethylene glycol powder (GLYCOLAX/MIRALAX) powder TAKE 17G DISSOLVED IN 8OZ WATER DAILY ASNEEDED FOR MODERATE CONSTIPATION 527 g 2  . predniSONE (DELTASONE) 10 MG tablet     . PROAIR HFA 108 (90 BASE) MCG/ACT inhaler INHALE ONE PUFF BY MOUTH EVERY 2 TO 4 HOURS AS NEEDED FOR WHEEZING 8.5 g 12  . SYMBICORT 160-4.5 MCG/ACT inhaler     . Vitamin D, Ergocalciferol, (DRISDOL) 50000 UNITS CAPS capsule Take 1 capsule (50,000 Units total) by mouth every 7 (seven) days. 8 capsule 0  . zolpidem (AMBIEN) 5 MG tablet TAKE ONE TABLET AT BEDTIME AS NEEDED FOR SLEEP 30 tablet 0   No current facility-administered medications for this visit.   Family History  Problem Relation Age of Onset  . Stroke Neg Hx   . Cancer Neg Hx   . Myelodysplastic syndrome Father     Died from myelofibrosis though diagnosed post-mortem   Social History   Social History  . Marital Status: Divorced    Spouse Name: N/A  . Number of Children: 2  . Years of Education: N/A   Occupational History  . retired    Social History Main Topics  . Smoking status: Current Every Day Smoker -- 0.20 packs/day for 45 years    Types: Cigarettes  . Smokeless tobacco: Never Used     Comment: 03/20/2014 "smoking 2 cigarettes/day; that's down from 2 ppd"  . Alcohol Use: 0.0 oz/week    0 Standard drinks or equivalent per week     Comment: 03/20/2014 "nothing in 7 years; never had a problem w/it"  . Drug Use: No  . Sexual Activity: No   Other Topics Concern  . 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 Negative for fever, chills and diaphoresis.  Respiratory: Positive for cough, sputum production and shortness of breath. Negative for hemoptysis.   Cardiovascular: Negative for chest pain and palpitations.  Gastrointestinal: Positive for nausea and vomiting. Negative for abdominal pain, diarrhea, constipation and blood in stool.  Genitourinary: Negative for dysuria.  Musculoskeletal: Positive for myalgias.  Skin: Negative for rash.  Neurological: Negative for weakness and headaches.   Objective:  Physical Exam: Filed Vitals:   10/23/14 0922  BP: 139/56  Pulse: 62  Temp: 99.1 F (37.3 C)  TempSrc: Oral  Height: 5' (1.524 m)  Weight: 89 lb (40.37 kg)  SpO2: 99%   Physical Exam GENERAL- sedated appearing and slow to respond, co-operative, appears as stated age, not in any distress. HEENT- Atraumatic, normocephalic, pupils constricted and slow to respond, EOMI, oral mucosa appears moist, good and intact dentition. No carotid bruit, no cervical LN enlargement, thyroid does not appear enlarged, neck supple. CARDIAC- RRR, no murmurs, rubs or gallops. RESP- Moving equal volumes of air, and clear to auscultation bilaterally, no wheezes or crackles. ABDOMEN- Soft, nontender, no guarding or rebound, no palpable masses or organomegaly, bowel sounds present.Marland Kitchen NEURO- AAO x 3 EXTREMITIES- pulse 2+, symmetric, no pedal edema. SKIN- Warm, dry, No rash or lesion. PSYCH- Normal mood and affect, appropriate thought content and speech.  Assessment & Plan:   Case discussed with Dr. Evette Doffing. Please refer to Problem based carting for further details of today's visit.

## 2014-10-23 NOTE — Patient Instructions (Signed)
Thank you for coming in today, it was a pleasure to meet you.  1. I have given you a list of the medications we would like you to be taking. If it is not on this list, please stop taking it. I would recommend taking only 5 mg of the oxycodone instead of the 10 mg. We can reassess this at your next visit.   2. Please bring all of your medications in to your next visit, including the bottles of the medicines you are no longer taking so that we can dispose of them for you.  3. Please follow up early next week. 8/15 or 8/16.

## 2014-10-23 NOTE — Progress Notes (Signed)
Contacted pharmacy for refill history and updated medication list to reflect what patient is taking. Also discussed medication recommendations with team including d/c antipsychotics, zolpidem, pantoprazole, cetirizine, and Symbicort. Pharmacy was also contacted to notify of d/c medications.   Time spent reviewing medications: 20 minutes. Patient also provided a pill splitter for oxycodone dose reduction.

## 2014-10-24 DIAGNOSIS — Z79899 Other long term (current) drug therapy: Secondary | ICD-10-CM | POA: Insufficient documentation

## 2014-10-24 DIAGNOSIS — F17200 Nicotine dependence, unspecified, uncomplicated: Secondary | ICD-10-CM | POA: Insufficient documentation

## 2014-10-24 NOTE — Assessment & Plan Note (Signed)
Here today complaining of not feeling well for the past two days. She reports that she feels tired and achy and "just doesn't feel good." She cannot provide any further details. She denies any fevers, chills, nausea, vomiting, diarrhea, dysuria, sore throat, chest pain. She notes worsening of her shortness of breath over the past several months with warmer weather but no change in the past two days. Denies any cough or sputum production.   She denies any recent changes in her medications and reports she is taking everything as prescribed. She did not bring all of her prescriptions in to clinic today but does have a few that she is requesting refills for today. One of the medications, Cymbata, was stopped at her last visit. She is unable to tell me what medications she is currently taking at home.   Upon further review of her medication list, she is taking Lamictal, Cymbalta, Abilify and Zyprexa along with Ambien and Percocet 10-325. I went through her medication list with Mannie Stabile and we stopped several medications. We also spoke with her about her pain management regimen. She is seen by pain management clinic and gets percocet 10-325 q12 hours. Given her level of sedation and advanced COPD (requiring 3L O2, ambulatory sats today 90%) we believe 10 mg may be too high of a dose and may cause further respiratory depression and possibly death. We explained this to her and asked her to cut her 10 mg pills in half. She was reluctant to do this, saying that 5 mg has not been enough for her in the past but agreed to try.  - D/C Zyprexa, Abilify, Ambien, Protonix (has Zofran as needed) - Decrease Percocet to 5-325 mg, reassess at next visit - Gave patient a list of the medications we wanted her to be taking, she has an aid at home and reports she will go through the list with her aid to ensure she is taking what she is suppose to be taking - Follow up in clinic in 1 week, asked patient to bring in all the  medications she has at home regardless if she is taking them or not so that we can ensure she is taking the right medications

## 2014-10-24 NOTE — Assessment & Plan Note (Signed)
Percocet 10-325 mg may be too sedating and decrease her respiratory drive too much. Asked patient to cut the pills in half. She is on multiple sedating medications. See polypharmacy note for full details. Will return to clinic in 1 week for follow up and will reassess.

## 2014-10-24 NOTE — Assessment & Plan Note (Signed)
Will stop Protonix today as patient's symptoms are controlled and continue Zofran prn.

## 2014-10-24 NOTE — Assessment & Plan Note (Signed)
  Assessment: Progress toward smoking cessation:   improvement Barriers to progress toward smoking cessation:   cravings Comments: Patient reports that over the last year she has gone from smoking 2 PPD to 1 cigarette a day. She is not smoking near her O2. Plans to quit entirely in the next 2-3 weeks. Has tried nicotine patches and gum in the past with no help. Does not wish for any assistance in quitting, reports she can do it on her own.  Plan: Instruction/counseling given:  I counseled patient on the dangers of tobacco use, advised patient to stop smoking, and reviewed strategies to maximize success. Educational resources provided:    Self management tools provided:    Medications to assist with smoking cessation:  None Patient agreed to the following self-care plans for smoking cessation:    Other plans: Continue offering support

## 2014-10-26 ENCOUNTER — Encounter (HOSPITAL_COMMUNITY): Payer: Self-pay | Admitting: Emergency Medicine

## 2014-10-26 ENCOUNTER — Emergency Department (HOSPITAL_COMMUNITY): Payer: Medicare Other

## 2014-10-26 ENCOUNTER — Other Ambulatory Visit: Payer: Self-pay

## 2014-10-26 ENCOUNTER — Observation Stay (HOSPITAL_COMMUNITY)
Admission: EM | Admit: 2014-10-26 | Discharge: 2014-10-27 | Disposition: A | Discharge status: Home / Self Care | Payer: Medicare Other | Attending: Oncology | Admitting: Oncology

## 2014-10-26 DIAGNOSIS — Z8701 Personal history of pneumonia (recurrent): Secondary | ICD-10-CM | POA: Insufficient documentation

## 2014-10-26 DIAGNOSIS — Z79899 Other long term (current) drug therapy: Secondary | ICD-10-CM | POA: Insufficient documentation

## 2014-10-26 DIAGNOSIS — Z7951 Long term (current) use of inhaled steroids: Secondary | ICD-10-CM | POA: Diagnosis not present

## 2014-10-26 DIAGNOSIS — Z720 Tobacco use: Secondary | ICD-10-CM | POA: Insufficient documentation

## 2014-10-26 DIAGNOSIS — M1990 Unspecified osteoarthritis, unspecified site: Secondary | ICD-10-CM | POA: Insufficient documentation

## 2014-10-26 DIAGNOSIS — R404 Transient alteration of awareness: Secondary | ICD-10-CM | POA: Diagnosis not present

## 2014-10-26 DIAGNOSIS — R5383 Other fatigue: Secondary | ICD-10-CM | POA: Diagnosis not present

## 2014-10-26 DIAGNOSIS — K267 Chronic duodenal ulcer without hemorrhage or perforation: Secondary | ICD-10-CM | POA: Insufficient documentation

## 2014-10-26 DIAGNOSIS — F319 Bipolar disorder, unspecified: Secondary | ICD-10-CM | POA: Diagnosis not present

## 2014-10-26 DIAGNOSIS — R531 Weakness: Secondary | ICD-10-CM | POA: Diagnosis not present

## 2014-10-26 DIAGNOSIS — D649 Anemia, unspecified: Secondary | ICD-10-CM | POA: Diagnosis not present

## 2014-10-26 DIAGNOSIS — J438 Other emphysema: Secondary | ICD-10-CM | POA: Insufficient documentation

## 2014-10-26 DIAGNOSIS — Z9981 Dependence on supplemental oxygen: Secondary | ICD-10-CM | POA: Insufficient documentation

## 2014-10-26 DIAGNOSIS — E0590 Thyrotoxicosis, unspecified without thyrotoxic crisis or storm: Secondary | ICD-10-CM | POA: Diagnosis not present

## 2014-10-26 DIAGNOSIS — M810 Age-related osteoporosis without current pathological fracture: Secondary | ICD-10-CM | POA: Diagnosis not present

## 2014-10-26 DIAGNOSIS — D62 Acute posthemorrhagic anemia: Secondary | ICD-10-CM | POA: Diagnosis not present

## 2014-10-26 DIAGNOSIS — F419 Anxiety disorder, unspecified: Secondary | ICD-10-CM | POA: Diagnosis not present

## 2014-10-26 DIAGNOSIS — E559 Vitamin D deficiency, unspecified: Secondary | ICD-10-CM | POA: Insufficient documentation

## 2014-10-26 DIAGNOSIS — J441 Chronic obstructive pulmonary disease with (acute) exacerbation: Secondary | ICD-10-CM | POA: Insufficient documentation

## 2014-10-26 DIAGNOSIS — G894 Chronic pain syndrome: Secondary | ICD-10-CM | POA: Diagnosis not present

## 2014-10-26 LAB — POC OCCULT BLOOD, ED: FECAL OCCULT BLD: NEGATIVE

## 2014-10-26 LAB — BASIC METABOLIC PANEL
ANION GAP: 11 (ref 5–15)
BUN: 6 mg/dL (ref 6–20)
CO2: 33 mmol/L — ABNORMAL HIGH (ref 22–32)
CREATININE: 0.38 mg/dL — AB (ref 0.44–1.00)
Calcium: 7.9 mg/dL — ABNORMAL LOW (ref 8.9–10.3)
Chloride: 96 mmol/L — ABNORMAL LOW (ref 101–111)
GFR calc non Af Amer: >60 mL/min (ref >60)
Glucose, Bld: 91 mg/dL (ref 65–99)
POTASSIUM: 3.8 mmol/L (ref 3.5–5.1)
Sodium: 140 mmol/L (ref 135–145)

## 2014-10-26 LAB — TYPE AND SCREEN
ABO/RH(D): O POS
ANTIBODY SCREEN: NEGATIVE

## 2014-10-26 LAB — CBC
HEMATOCRIT: 24.9 % — AB (ref 36.0–46.0)
HEMOGLOBIN: 7.6 g/dL — AB (ref 12.0–15.0)
MCH: 29.2 pg (ref 26.0–34.0)
MCHC: 30.5 g/dL (ref 30.0–36.0)
MCV: 95.8 fL (ref 78.0–100.0)
Platelets: 598 10*3/uL — ABNORMAL HIGH (ref 150–400)
RBC: 2.6 MIL/uL — ABNORMAL LOW (ref 3.87–5.11)
RDW: 16.6 % — AB (ref 11.5–15.5)
WBC: 7.2 10*3/uL (ref 4.0–10.5)

## 2014-10-26 LAB — HEPATIC FUNCTION PANEL
ALBUMIN: 3 g/dL — AB (ref 3.5–5.0)
ALK PHOS: 76 U/L (ref 38–126)
ALT: 11 U/L — AB (ref 14–54)
AST: 19 U/L (ref 15–41)
Bilirubin, Direct: <0.1 mg/dL — ABNORMAL LOW (ref 0.1–0.5)
Total Bilirubin: 0.1 mg/dL — ABNORMAL LOW (ref 0.3–1.2)
Total Protein: 5.6 g/dL — ABNORMAL LOW (ref 6.5–8.1)

## 2014-10-26 LAB — URINALYSIS, ROUTINE W REFLEX MICROSCOPIC
Bilirubin Urine: NEGATIVE
GLUCOSE, UA: NEGATIVE mg/dL
HGB URINE DIPSTICK: NEGATIVE
KETONES UR: NEGATIVE mg/dL
Leukocytes, UA: NEGATIVE
Nitrite: NEGATIVE
PH: 5 (ref 5.0–8.0)
PROTEIN: NEGATIVE mg/dL
SPECIFIC GRAVITY, URINE: 1.014 (ref 1.005–1.030)
UROBILINOGEN UA: 0.2 mg/dL (ref 0.0–1.0)

## 2014-10-26 LAB — FERRITIN: FERRITIN: 6 ng/mL — AB (ref 11–307)

## 2014-10-26 LAB — RETICULOCYTES
RBC.: 2.63 MIL/uL — AB (ref 3.87–5.11)
RETIC COUNT ABSOLUTE: 63.1 10*3/uL (ref 19.0–186.0)
RETIC CT PCT: 2.4 % (ref 0.4–3.1)

## 2014-10-26 LAB — TROPONIN I: Troponin I: <0.03 ng/mL (ref <0.031)

## 2014-10-26 LAB — CBG MONITORING, ED: GLUCOSE-CAPILLARY: 95 mg/dL (ref 65–99)

## 2014-10-26 MED ORDER — IPRATROPIUM-ALBUTEROL 0.5-2.5 (3) MG/3ML IN SOLN: 3.0000 mL | RESPIRATORY_TRACT | Status: DC | PRN

## 2014-10-26 MED ORDER — LAMOTRIGINE 100 MG PO TABS: 100.0000 mg | ORAL_TABLET | ORAL | Status: DC

## 2014-10-26 MED ORDER — DULOXETINE HCL 30 MG PO CPEP
30.0000 mg | ORAL_CAPSULE | Freq: Every day | ORAL | Status: DC
  Administered 2014-10-27: 30 mg via ORAL
  Filled 2014-10-26: qty 1

## 2014-10-26 MED ORDER — OXYCODONE HCL 5 MG PO TABS
10.0000 mg | ORAL_TABLET | Freq: Once | ORAL | Status: AC
  Administered 2014-10-26: 10 mg via ORAL
  Filled 2014-10-26: qty 2

## 2014-10-26 MED ORDER — OXYCODONE HCL 10 MG PO TABS: 10.0000 mg | ORAL_TABLET | Freq: Two times a day (BID) | ORAL | Status: DC

## 2014-10-26 MED ORDER — GABAPENTIN 300 MG PO CAPS
300.0000 mg | ORAL_CAPSULE | Freq: Three times a day (TID) | ORAL | Status: DC
  Administered 2014-10-26 – 2014-10-27 (×2): 300 mg via ORAL
  Filled 2014-10-26 (×2): qty 1

## 2014-10-26 MED ORDER — BUDESONIDE 0.5 MG/2ML IN SUSP
0.5000 mg | Freq: Two times a day (BID) | RESPIRATORY_TRACT | Status: DC
  Administered 2014-10-26 – 2014-10-27 (×2): 0.5 mg via RESPIRATORY_TRACT
  Filled 2014-10-26 (×2): qty 2

## 2014-10-26 MED ORDER — OXYCODONE HCL ER 10 MG PO T12A
10.0000 mg | EXTENDED_RELEASE_TABLET | Freq: Two times a day (BID) | ORAL | Status: DC
  Administered 2014-10-26: 10 mg via ORAL
  Filled 2014-10-26 (×2): qty 1

## 2014-10-26 MED ORDER — FERUMOXYTOL INJECTION 510 MG/17 ML
510.0000 mg | Freq: Once | INTRAVENOUS | Status: AC
  Administered 2014-10-26: 510 mg via INTRAVENOUS
  Filled 2014-10-26: qty 17

## 2014-10-26 NOTE — ED Notes (Signed)
Pt aware of urine sample needed, pt unable to go at this time. 

## 2014-10-26 NOTE — ED Notes (Signed)
MD at bedside. 

## 2014-10-26 NOTE — ED Notes (Signed)
Pt from home via [facility] with c/o increased weakness and fatigue over the last several days with nausea.  Pt denies CP, no change in SOB for normal.  Lungs clear, SpO2 98% 3L, NSR.  Pt reports the last time she felt like this she was admitted for a COPD exacerbation.  Given 4 mg nausea with relief.  Pt in NAD, A&O.

## 2014-10-26 NOTE — ED Provider Notes (Signed)
CSN: [id number]     Arrival date & time 10/26/14  1700 History   First MD Initiated Contact with Patient 10/26/14 1707     Chief Complaint  Patient presents with  . Fatigue    Patient is a 64 y.o. female presenting with weakness. The history is provided by the patient.  Weakness This is a new problem. The current episode started more than 2 days ago. The problem occurs daily. The problem has been gradually worsening. Associated symptoms include shortness of breath. Pertinent negatives include no chest pain, no abdominal pain and no headaches. Nothing aggravates the symptoms. Nothing relieves the symptoms. She has tried nothing for the symptoms.   Pt presents from home for increased fatigue She reports "no energy" for several days No fever/vomiting/HA No CP She reports chronic cough She reports chronic SOB and is on home oxygen (3L, no change)  No abd pain  no focal weakness  Pt denies SI She denies access to weapons Past Medical History  Diagnosis Date  . Depression   . Suicidal ideation 2007     attempted overdose 2012  Dr. Tivis Ringer report  . Bipolar disorder   . Substance abuse      narcotics, alcohol, tobacco  . Chronic pain syndrome     follows at [facility]  . DEFICIENCY, VITAMIN D NOS 01/03/2007  . Chronic lower back pain   . Anxiety   . OSTEOPOROSIS 06/17/2009    DEXA 05/2009 : L femur -2.9; R femur -2.5. Alendronate on med list but not taking. Needs addressed ASAP as h/o fractures.    . Elevated liver function tests   . On home oxygen therapy     "3L; 24/7" (03/20/2014)  . Shortness of breath     "all the time" (02/27/2013)  . 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 Sciatic pain   . Anemia   . Pneumonia X 1?  . Hyperthyroidism     "borderline"  . History of blood transfusion     "getting my 1st later tonight" (03/20/2014)  . Arthritis     "legs, back; hips, primarily left hip" (03/20/2014)   Past  Surgical History  Procedure Laterality Date  . Orif ankle fracture Left 10/2010  . Cesarean section  1974; 1979  . Tubal ligation  1979  . Augmentation mammaplasty  ~ 2007  . Fracture surgery    . Ankle debridement Left 10/2010   Family History  Problem Relation Age of Onset  . Stroke Neg Hx   . Cancer Neg Hx   . Myelodysplastic syndrome Father     Died from myelofibrosis though diagnosed post-mortem   Social History  Substance Use Topics  . Smoking status: Current Every Day Smoker -- 0.20 packs/day for 45 years    Types: Cigarettes  . Smokeless tobacco: Never Used     Comment: 03/20/2014 "smoking 2 cigarettes/day; that's down from 2 ppd"  . Alcohol Use: 0.0 oz/week    0 Standard drinks or equivalent per week     Comment: 03/20/2014 "nothing in 7 years; never had a problem w/it"   OB History    No data available     Review of Systems  Constitutional: Positive for fatigue. Negative for fever.  Respiratory: Positive for shortness of breath.   Cardiovascular: Negative for chest pain.  Gastrointestinal: Negative for abdominal pain and blood in stool.  Neurological: Positive for weakness. Negative for syncope and headaches.  Psychiatric/Behavioral:  Negative for suicidal ideas.  All other systems reviewed and are negative.     Allergies  Banana; Lyrica; and Pollen extract  Home Medications   Prior to Admission medications   Medication Sig Start Date End Date Taking? Authorizing Provider  alendronate (FOSAMAX) 70 MG tablet Take 1 tablet (70 mg total) by mouth once a week. Take with a full glass of water on an empty stomach. 10/16/14   Rushil Sherrye Payor, MD  budesonide (PULMICORT) 0.5 MG/2ML nebulizer solution Take 2 mLs (0.5 mg total) by nebulization 2 (two) times daily. DX j43.8 07/03/14   Kathee Delton, MD  calcium carbonate (TUMS - DOSED IN MG ELEMENTAL CALCIUM) 500 MG chewable tablet CHEW ONE TABLET THREE TIMES DAILY WITH MEALS 09/23/14   Sid Falcon, MD  DULoxetine (CYMBALTA)  30 MG capsule  08/27/14   Historical Provider, MD  ferrous sulfate 325 (65 FE) MG tablet Take 1 tablet (325 mg total) by mouth daily. Take once a day or every other day, as tolerated. 09/17/14 09/17/15  Zada Finders, MD  fluticasone (FLONASE) 50 MCG/ACT nasal spray Place 1 spray into both nostrils daily. 06/13/14 06/13/15  Francesca Oman, DO  Fluticasone-Salmeterol (ADVAIR DISKUS) 250-50 MCG/DOSE AEPB INHALE 1 PUFF INTO THE LUNGS 2 TIMES DAILY 03/19/14   Jones Bales, MD  gabapentin (NEURONTIN) 300 MG capsule Take 300 mg by mouth 3 (three) times daily.  06/25/14   Historical Provider, MD  ipratropium-albuterol (DUONEB) 0.5-2.5 (3) MG/3ML SOLN 4 times a day (breakfast, lunch, dinner, bedtime).Can take 2 additional treatments if needed on bad days. DX J43.8 07/03/14   Kathee Delton, MD  lamoTRIgine (LAMICTAL) 100 MG tablet TAKE ONE TABLET EVERY DAY 09/23/14   Sid Falcon, MD  loratadine (CLARITIN) 10 MG tablet Take 10 mg by mouth daily.    Historical Provider, MD  naproxen (NAPROSYN) 500 MG tablet  07/10/14   Historical Provider, MD  ondansetron (ZOFRAN ODT) 4 MG disintegrating tablet Take 1 tablet (4 mg total) by mouth every 8 (eight) hours as needed for nausea or vomiting. 07/17/14   Jerrye Noble, MD  Oxycodone HCl 10 MG TABS  09/16/14   Historical Provider, MD  polyethylene glycol powder (GLYCOLAX/MIRALAX) powder TAKE 17G DISSOLVED IN 8OZ WATER DAILY ASNEEDED FOR MODERATE CONSTIPATION 09/23/14   Sid Falcon, MD  PROAIR HFA 108 (90 BASE) MCG/ACT inhaler INHALE ONE PUFF BY MOUTH EVERY 2 TO 4 HOURS AS NEEDED FOR WHEEZING 05/21/14   Rushil Sherrye Payor, MD  Vitamin D, Ergocalciferol, (DRISDOL) 50000 UNITS CAPS capsule Take 1 capsule (50,000 Units total) by mouth every 7 (seven) days. 09/10/14   Rushil Sherrye Payor, MD   BP 134/67 mmHg  Pulse 64  Temp(Src) 98.7 F (37.1 C) (Oral)  Resp 22  SpO2 99% Physical Exam CONSTITUTIONAL: elderly, frail appearing HEAD: Normocephalic/atraumatic EYES: EOMI/PERRL ENMT: Mucous  membranes moist NECK: supple no meningeal signs SPINE/BACK:entire spine nontender CV: S1/S2 noted, no murmurs/rubs/gallops noted LUNGS: scattered wheezing bilaterally, no distress noted ABDOMEN: soft, nontender, no rebound or guarding, bowel sounds noted throughout abdomen GU:no cva tenderness NEURO: Pt is awake/alert/appropriate, moves all extremitiesx4.  No facial droop.  No arm or leg drift is noted EXTREMITIES: pulses normal/equal, full ROM SKIN: warm, color normal PSYCH: no abnormalities of mood noted, alert and oriented to situation  ED Course  Procedures  5:53 PM Pt here for fatigue Recently seen by PCP for this and felt likely due to polypharmacy Will check labs/CXR Pt stable at this time  She requests her home pain meds (oxycodone 10) that is due at this time 7:08 PM Pt noted to be anemic Rectal performed with female chaperone - no blood/melena, hemoccult negative Will need admit Pt does not have any objections to receiving blood products Labs Review Labs Reviewed  CBC - Abnormal; Notable for the following:    RBC 2.60 (*)    Hemoglobin 7.6 (*)    HCT 24.9 (*)    RDW 16.6 (*)    Platelets 598 (*)    All other components within normal limits  BASIC METABOLIC PANEL  URINALYSIS, ROUTINE W REFLEX MICROSCOPIC (NOT AT [facility])  TROPONIN I  CBG MONITORING, ED  POC OCCULT BLOOD, ED  TYPE AND SCREEN    Imaging Review Dg Chest Portable 1 View  10/26/2014   CLINICAL DATA:  Increasing weakness and fatigue  EXAM: PORTABLE CHEST - 1 VIEW  COMPARISON:  08/05/2014  FINDINGS: The heart size and mediastinal contours are within normal limits. Both lungs are clear. The visualized skeletal structures are unremarkable.  IMPRESSION: No active disease.   Electronically Signed   By: Inez Catalina M.D.   On: 10/26/2014 18:14   I, Sharyon Cable, personally reviewed and evaluated these images and lab results as part of my medical decision-making.   EKG Interpretation   Date/Time:   Sunday October 26 2014 17:26:41 EDT Ventricular Rate:  63 PR Interval:  129 QRS Duration: 66 QT Interval:  383 QTC Calculation: 392 R Axis:   84 Text Interpretation:  Sinus rhythm Borderline right axis deviation  Nonspecific T abnrm, anterolateral leads artifact noted Confirmed by  Christy Gentles  MD, Elenore Rota ([id number]) on 10/26/2014 5:45:00 PM      MDM   Final diagnoses:  Acute blood loss anemia    Nursing notes including past medical history and social history reviewed and considered in documentation Previous records reviewed and considered xrays/imaging reviewed by myself and considered during evaluation Labs/vital reviewed myself and considered during evaluation     Ripley Fraise, MD 10/26/14 2322

## 2014-10-26 NOTE — H&P (Signed)
Date: 10/26/2014               Patient Name:  [name] MRN: [id number]  DOB: 1950/03/29 Age / Sex: 64 y.o., female   PCP: Riccardo Dubin, MD         Medical Service: Internal Medicine Teaching Service         Attending Physician: Dr. Annia Belt, MD    First Contact: Dr. Burgess Estelle Pager: [phone]  Second Contact: Dr. Joni Reining Pager: [phone]       After Hours (After 5p/  First Contact Pager: [phone]  weekends / holidays): Second Contact Pager: [phone]   Chief Complaint: Fatigue  History of Present Illness: [name] is a 64 y.o. caucasian female with past medical history of depression, bipolar disorder, tobacco abuse history, COPD requiring 3L O2 via nasal cannula at home (no PFTs in her chart), iron deficiency anemia, osteoporosis who presents with fatigue.  She was recently seen in the [facility] where she similarly complained of feeling tired, achy, and "just doesn't feel good."  Thought to be related in part to polypharmacy, she had many medicines discontinued (Zyprexa, Abilify, Ambien, Protonix) and had her Percocet decreased from 10-325 to 5-325.  She presented to the emergency department this evening with worsening symptoms of fatigue, being sleepy, SOB worse than her baseline, and feeling weak.  She denies any dizziness or lightheadedness, denies chest pain.  CBC drawn in the emergency department revealed a hemoglobin of 7.6 (down from 11.3 on 09/05/14 and from her baseline of approximately 9) and hematocrit of 24.9 (down from 34.5 in June).  Her MCV was normal at 95.8 and platelets elevated at 598.  She is prescribed oral iron supplements and reports compliance with once per day dosing. However, upon reviewing prior notes she has been non-compliant with this in the past.  She denies starting any new medications recently and denies any signs of blood loss such as blood in her stool, dark or tarry stool, hematemesis, hemoptysis, hematuria.  FOBT performed in the ED  was negative.  Meds: Current Facility-Administered Medications  Medication Dose Route Frequency Provider Last Rate Last Dose  . budesonide (PULMICORT) nebulizer solution 0.5 mg  0.5 mg Nebulization BID Corky Sox, MD      . Derrill Memo ON 10/27/2014] DULoxetine (CYMBALTA) DR capsule 30 mg  30 mg Oral Daily Corky Sox, MD      . ferumoxytol [facility]) 510 mg in sodium chloride 0.9 % 100 mL IVPB  510 mg Intravenous Once Corky Sox, MD      . gabapentin (NEURONTIN) capsule 300 mg  300 mg Oral TID Corky Sox, MD      . ipratropium-albuterol (DUONEB) 0.5-2.5 (3) MG/3ML nebulizer solution 3 mL  3 mL Nebulization Q4H PRN Corky Sox, MD      . Derrill Memo ON 10/28/2014] lamoTRIgine (LAMICTAL) tablet 100 mg  100 mg Oral QODAY Corky Sox, MD      . OxyCODONE (OXYCONTIN) 12 hr tablet 10 mg  10 mg Oral Q12H Annia Belt, MD        Allergies: Allergies as of 10/26/2014 - Review Complete 10/26/2014  Allergen Reaction Noted  . Banana Shortness Of Breath and Nausea And Vomiting 01/10/2012  . Lyrica [pregabalin] Other (See Comments) 06/26/2014  . Pollen extract Other (See Comments) 11/20/2012   Past Medical History  Diagnosis Date  . Depression   . Suicidal ideation 2007     attempted overdose  2012  Dr. Tivis Ringer report  . Bipolar disorder   . Substance abuse      narcotics, alcohol, tobacco  . Chronic pain syndrome     follows at [facility]  . DEFICIENCY, VITAMIN D NOS 01/03/2007  . Chronic lower back pain   . Anxiety   . OSTEOPOROSIS 06/17/2009    DEXA 05/2009 : L femur -2.9; R femur -2.5. Alendronate on med list but not taking. Needs addressed ASAP as h/o fractures.    . Elevated liver function tests   . On home oxygen therapy     "3L; 24/7" (03/20/2014)  . Shortness of breath     "all the time" (02/27/2013)  . 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 Sciatic pain   . Anemia   . Pneumonia X 1?  . Hyperthyroidism      "borderline"  . History of blood transfusion     "getting my 1st later tonight" (03/20/2014)  . Arthritis     "legs, back; hips, primarily left hip" (03/20/2014)   Past Surgical History  Procedure Laterality Date  . Orif ankle fracture Left 10/2010  . Cesarean section  1974; 1979  . Tubal ligation  1979  . Augmentation mammaplasty  ~ 2007  . Fracture surgery    . Ankle debridement Left 10/2010   Family History  Problem Relation Age of Onset  . Stroke Neg Hx   . Cancer Neg Hx   . Myelodysplastic syndrome Father     Died from myelofibrosis though diagnosed post-mortem   Social History   Social History  . Marital Status: Divorced    Spouse Name: N/A  . Number of Children: 2  . Years of Education: N/A   Occupational History  . retired    Social History Main Topics  . Smoking status: Current Every Day Smoker -- 0.20 packs/day for 45 years    Types: Cigarettes  . Smokeless tobacco: Never Used     Comment: 03/20/2014 "smoking 2 cigarettes/day; that's down from 2 ppd"  . Alcohol Use: 0.0 oz/week    0 Standard drinks or equivalent per week     Comment: 03/20/2014 "nothing in 7 years; never had a problem w/it"  . Drug Use: No  . Sexual Activity: No   Other Topics Concern  . 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 Negative for fever and chills.  Respiratory: Positive for shortness of breath. Negative for cough and hemoptysis.   Cardiovascular: Negative for chest pain and palpitations.  Gastrointestinal: Negative for nausea, vomiting, abdominal pain, blood in stool and melena.  Genitourinary: Negative for dysuria, frequency and hematuria.  Neurological: Positive for weakness. Negative  for dizziness.  Psychiatric/Behavioral: Negative for depression.     Physical Exam: Blood pressure 143/58, pulse 65, temperature 97.7 F (36.5 C), temperature source Oral, resp. rate 14, height 5\' 1"  (1.549 m), weight 96 lb 12.5 oz (43.9 kg), SpO2 98 %.  Physical Exam  Constitutional: She is oriented to person, place, and time. She appears well-developed and well-nourished. No distress.  HENT:  Head:  Normocephalic and atraumatic.  Eyes: Conjunctivae and EOM are normal.  Neck: Neck supple.  Cardiovascular: Normal rate, regular rhythm, normal heart sounds and intact distal pulses.   Pulmonary/Chest: Effort normal. No respiratory distress. She has wheezes. She has no rales.  Abdominal: Soft. Bowel sounds are normal. There is no tenderness.  Musculoskeletal: She exhibits no edema.  Neurological: She is alert and oriented to person, place, and time.  Skin: Skin is warm, dry and intact. No pallor.  Psychiatric: She has a normal mood and affect. Her speech is normal and behavior is normal.    Lab results: Basic Metabolic Panel:  Recent Labs  10/26/14 1742  NA 140  K 3.8  CL 96*  CO2 33*  GLUCOSE 91  BUN 6  CREATININE 0.38*  CALCIUM 7.9*   CBC:  Recent Labs  10/26/14 1742  WBC 7.2  HGB 7.6*  HCT 24.9*  MCV 95.8  PLT 598*   Cardiac Enzymes:  Recent Labs  10/26/14 1742  TROPONINI <0.03   CBG:  Recent Labs  10/26/14 1729  GLUCAP 95   Urinalysis:  Recent Labs  10/26/14 1918  COLORURINE YELLOW  LABSPEC 1.014  PHURINE 5.0  GLUCOSEU NEGATIVE  HGBUR NEGATIVE  BILIRUBINUR NEGATIVE  KETONESUR NEGATIVE  PROTEINUR NEGATIVE  UROBILINOGEN 0.2  NITRITE NEGATIVE  LEUKOCYTESUR NEGATIVE    Imaging results:  Dg Chest Portable 1 View  10/26/2014   CLINICAL DATA:  Increasing weakness and fatigue  EXAM: PORTABLE CHEST - 1 VIEW  COMPARISON:  08/05/2014  FINDINGS: The heart size and mediastinal contours are within normal limits. Both lungs are clear. The visualized  skeletal structures are unremarkable.  IMPRESSION: No active disease.   Electronically Signed   By: Inez Catalina M.D.   On: 10/26/2014 18:14    Other results: EKG: sinus rhythm, borderline right axis deviation, nonspecific T wave abnormalities anterolateral leads  Assessment & Plan by Problem: Active Problems:   Acute blood loss anemia   Anemia  64 y.o. caucasian female with past medical history of depression, bipolar disorder, tobacco abuse history, COPD requiring 3L O2 via nasal cannula at home (no PFTs in her chart), iron deficiency anemia, osteoporosis, who presents with weakness and fatigue.  Anemia, iron deficiency -3-4 day history of increased weakness and fatigue in a patient with history of iron deficiency anemia having recently been hospitalized in January 2016 for symptomatic anemia with hemoglobin of 5.3.  At that time she denied any obvious signs of bleeding and was FOBT negative.  During that hospitalization she received 2 units of pRBCs with improvement in hemoglobin to 9.  GI was consulted at the time with the recommendation to follow closely and transfuse as necessary citing she would not be a good candidate for endoscopy given her pulmonary disease status.  She has a history of poor compliance to her iron supplementation.  Her iron was 29 in June 2016. Most recent other anemia panel studies from August 2015 show: iron 14, TIBC 360,  ferritin 10, folate 13.7, vitamin b12 961 (423 in May 2016).  She's also had a negative celiac panel workup in September 2014.  TSH was 3.9 in May 2016.  She also has an outstanding order for H. Pylori stool antigen to consider source of anemia.  However, she is without signs and symptoms of gastritis. -her last Hgb of 11.3 in June likely above her baseline of around 9 due to being hemoconcentrated. Evidence for this is in her Creatine being elevated at almost 2 times  its normal -transfuse if Hgb <7.0.  Reasons for not transfusing at this point due to  her symptoms being related to chronic anemia.  She is not tachycardic, hypotensive, orthostatic, and denies any chest pain with negative troponin -repeat CBC, BMP in the AM.  Consider transfusion threshold as above -reticulocytes 2.4%, total bili 0.1, ferritin 6 -save smear -Feraheme 510mg  x 1 -admit to telemetry for cardiac monitoring  COPD -stable, requires 3L home oxygen via nasal cannula -Pulmicort nebs bid -Duonebs q4h prn  Depression -stable -continue Cymbalta 30mg  daily  Bipolar disorder -stable -continue Lamictal 100mg  every other day  Chronic pain -stable -Oxycodone 10mg  q12h  Diet: regular  DVT: SCDs  Code: DNR   Dispo: Disposition is deferred at this time, awaiting improvement of current medical problems. Anticipated discharge in approximately 2-3 day(s).   The patient does have a current PCP (Rushil Sherrye Payor, MD) and does need an [facility] hospital follow-up appointment after discharge.  The patient does not have transportation limitations that hinder transportation to clinic appointments.  Signed: Jule Ser, DO 10/26/2014, 9:07 PM

## 2014-10-27 ENCOUNTER — Telehealth: Payer: Self-pay | Admitting: Internal Medicine

## 2014-10-27 DIAGNOSIS — D509 Iron deficiency anemia, unspecified: Secondary | ICD-10-CM

## 2014-10-27 DIAGNOSIS — D62 Acute posthemorrhagic anemia: Secondary | ICD-10-CM | POA: Diagnosis not present

## 2014-10-27 DIAGNOSIS — F319 Bipolar disorder, unspecified: Secondary | ICD-10-CM

## 2014-10-27 DIAGNOSIS — G8929 Other chronic pain: Secondary | ICD-10-CM

## 2014-10-27 DIAGNOSIS — Z9981 Dependence on supplemental oxygen: Secondary | ICD-10-CM | POA: Diagnosis not present

## 2014-10-27 DIAGNOSIS — F329 Major depressive disorder, single episode, unspecified: Secondary | ICD-10-CM

## 2014-10-27 DIAGNOSIS — J449 Chronic obstructive pulmonary disease, unspecified: Secondary | ICD-10-CM

## 2014-10-27 DIAGNOSIS — J438 Other emphysema: Secondary | ICD-10-CM | POA: Insufficient documentation

## 2014-10-27 LAB — CBC
HCT: 25.7 % — ABNORMAL LOW (ref 36.0–46.0)
Hemoglobin: 7.9 g/dL — ABNORMAL LOW (ref 12.0–15.0)
MCH: 29.7 pg (ref 26.0–34.0)
MCHC: 30.7 g/dL (ref 30.0–36.0)
MCV: 96.6 fL (ref 78.0–100.0)
PLATELETS: 618 10*3/uL — AB (ref 150–400)
RBC: 2.66 MIL/uL — ABNORMAL LOW (ref 3.87–5.11)
RDW: 16.8 % — AB (ref 11.5–15.5)
WBC: 6.7 10*3/uL (ref 4.0–10.5)

## 2014-10-27 LAB — BASIC METABOLIC PANEL
Anion gap: 11 (ref 5–15)
BUN: 9 mg/dL (ref 6–20)
CO2: 31 mmol/L (ref 22–32)
CREATININE: 0.4 mg/dL — AB (ref 0.44–1.00)
Calcium: 7.6 mg/dL — ABNORMAL LOW (ref 8.9–10.3)
Chloride: 96 mmol/L — ABNORMAL LOW (ref 101–111)
GFR calc Af Amer: >60 mL/min (ref >60)
GLUCOSE: 59 mg/dL — AB (ref 65–99)
Potassium: 3.8 mmol/L (ref 3.5–5.1)
SODIUM: 138 mmol/L (ref 135–145)

## 2014-10-27 LAB — SAVE SMEAR

## 2014-10-27 MED ORDER — OXYCODONE HCL ER 10 MG PO T12A
10.0000 mg | EXTENDED_RELEASE_TABLET | Freq: Two times a day (BID) | ORAL | Status: DC
  Administered 2014-10-27: 10 mg via ORAL

## 2014-10-27 NOTE — Progress Notes (Signed)
Internal Medicine Clinic Attending  I saw and evaluated the patient.  I personally confirmed the key portions of the history and exam documented by Dr. Charlynn Grimes and I reviewed pertinent patient test results.  The assessment, diagnosis, and plan were formulated together and I agree with the documentation in the resident's note.  Difficult case of polypharmacy stemming from several comorbid conditions including bipolar disease, failure to thrive, copd, and chronic pain. On exam today she has constricted pupils and slowed speech. Dr. Charlynn Grimes has given her an updated medication list, but we encouraged her to bring all her medicines into clinic next week so we can complete a careful reconciliation. I worry she is making errors filling her own pill box at home. Her current symptom onset corresponds with recent increase in percocet to 10mg  tabs, so we advised strongly reduction back to 5mg .

## 2014-10-27 NOTE — Progress Notes (Signed)
Subjective: Patient doing well, still somewhat fatigued. She understood the risks of doing a colonoscopy but was agreeable to doing a virtual colonoscopy. No other complaints.  Objective: Vital signs in last 24 hours: Filed Vitals:   10/26/14 2302 10/27/14 0534 10/27/14 0833 10/27/14 1006  BP:  151/66  143/58  Pulse:  69  62  Temp:  97.9 F (36.6 C)  98.3 F (36.8 C)  TempSrc:  Oral  Oral  Resp:  14  17  Height:      Weight:      SpO2: 99% 99% 92% 97%   Weight change:   Intake/Output Summary (Last 24 hours) at 10/27/14 1058 Last data filed at 10/27/14 1019  Gross per 24 hour  Intake    900 ml  Output    600 ml  Net    300 ml   BP 143/58 mmHg  Pulse 62  Temp(Src) 98.3 F (36.8 C) (Oral)  Resp 17  Ht 5\' 1"  (1.549 m)  Wt 43.9 kg (96 lb 12.5 oz)  BMI 18.30 kg/m2  SpO2 97%  General Appearance:    Alert, cooperative, no distress, appears stated age  Head:    Normocephalic, without obvious abnormality, atraumatic  Eyes:    PERRL, conjunctiva slightly pale, EOM's intact  Throat:   Lips, mucosa, and tongue normal; teeth and gums normal  Lungs:     Clear to auscultation bilaterally, respirations unlabored  Chest Wall:    No tenderness or deformity   Heart:    Regular rate and rhythm, S1 and S2 normal, no murmur, rub   or gallop  Extremities:   Extremities normal, atraumatic, no cyanosis or edema  Skin:   Skin color, texture, turgor normal, no rashes or lesions   Lab Results: Results for orders placed or performed during the hospital encounter of 10/26/14 (from the past 24 hour(s))  Hepatic function panel     Status: Abnormal   Collection Time: 10/26/14  5:06 PM  Result Value Ref Range   Total Protein 5.6 (L) 6.5 - 8.1 g/dL   Albumin 3.0 (L) 3.5 - 5.0 g/dL   AST 19 15 - 41 U/L   ALT 11 (L) 14 - 54 U/L   Alkaline Phosphatase 76 38 - 126 U/L   Total Bilirubin 0.1 (L) 0.3 - 1.2 mg/dL   Bilirubin, Direct <0.1 (L) 0.1 - 0.5 mg/dL   Indirect Bilirubin NOT CALCULATED 0.3 -  0.9 mg/dL  CBG monitoring, ED     Status: None   Collection Time: 10/26/14  5:29 PM  Result Value Ref Range   Glucose-Capillary 95 65 - 99 mg/dL  Basic metabolic panel     Status: Abnormal   Collection Time: 10/26/14  5:42 PM  Result Value Ref Range   Sodium 140 135 - 145 mmol/L   Potassium 3.8 3.5 - 5.1 mmol/L   Chloride 96 (L) 101 - 111 mmol/L   CO2 33 (H) 22 - 32 mmol/L   Glucose, Bld 91 65 - 99 mg/dL   BUN 6 6 - 20 mg/dL   Creatinine, Ser 0.38 (L) 0.44 - 1.00 mg/dL   Calcium 7.9 (L) 8.9 - 10.3 mg/dL   GFR calc non Af Amer >60 >60 mL/min   GFR calc Af Amer >60 >60 mL/min   Anion gap 11 5 - 15  CBC     Status: Abnormal   Collection Time: 10/26/14  5:42 PM  Result Value Ref Range   WBC 7.2 4.0 - 10.5 K/uL  RBC 2.60 (L) 3.87 - 5.11 MIL/uL   Hemoglobin 7.6 (L) 12.0 - 15.0 g/dL   HCT 24.9 (L) 36.0 - 46.0 %   MCV 95.8 78.0 - 100.0 fL   MCH 29.2 26.0 - 34.0 pg   MCHC 30.5 30.0 - 36.0 g/dL   RDW 16.6 (H) 11.5 - 15.5 %   Platelets 598 (H) 150 - 400 K/uL  Troponin I     Status: None   Collection Time: 10/26/14  5:42 PM  Result Value Ref Range   Troponin I <0.03 <0.031 ng/mL  POC occult blood, ED     Status: None   Collection Time: 10/26/14  7:13 PM  Result Value Ref Range   Fecal Occult Bld NEGATIVE NEGATIVE  Urinalysis, Routine w reflex microscopic (not at [facility])     Status: Abnormal   Collection Time: 10/26/14  7:18 PM  Result Value Ref Range   Color, Urine YELLOW YELLOW   APPearance CLOUDY (A) CLEAR   Specific Gravity, Urine 1.014 1.005 - 1.030   pH 5.0 5.0 - 8.0   Glucose, UA NEGATIVE NEGATIVE mg/dL   Hgb urine dipstick NEGATIVE NEGATIVE   Bilirubin Urine NEGATIVE NEGATIVE   Ketones, ur NEGATIVE NEGATIVE mg/dL   Protein, ur NEGATIVE NEGATIVE mg/dL   Urobilinogen, UA 0.2 0.0 - 1.0 mg/dL   Nitrite NEGATIVE NEGATIVE   Leukocytes, UA NEGATIVE NEGATIVE  Type and screen     Status: None   Collection Time: 10/26/14  7:47 PM  Result Value Ref Range   ABO/RH(D) O POS     Antibody Screen NEG    Sample Expiration 10/29/2014   Reticulocytes     Status: Abnormal   Collection Time: 10/26/14  9:45 PM  Result Value Ref Range   Retic Ct Pct 2.4 0.4 - 3.1 %   RBC. 2.63 (L) 3.87 - 5.11 MIL/uL   Retic Count, Manual 63.1 19.0 - 186.0 K/uL  Ferritin     Status: Abnormal   Collection Time: 10/26/14  9:45 PM  Result Value Ref Range   Ferritin 6 (L) 11 - 307 ng/mL  Save smear     Status: None   Collection Time: 10/26/14  9:45 PM  Result Value Ref Range   Smear Review SMEAR STAINED AND AVAILABLE FOR REVIEW   CBC     Status: Abnormal   Collection Time: 10/27/14  4:33 AM  Result Value Ref Range   WBC 6.7 4.0 - 10.5 K/uL   RBC 2.66 (L) 3.87 - 5.11 MIL/uL   Hemoglobin 7.9 (L) 12.0 - 15.0 g/dL   HCT 25.7 (L) 36.0 - 46.0 %   MCV 96.6 78.0 - 100.0 fL   MCH 29.7 26.0 - 34.0 pg   MCHC 30.7 30.0 - 36.0 g/dL   RDW 16.8 (H) 11.5 - 15.5 %   Platelets 618 (H) 150 - 400 K/uL  Basic metabolic panel     Status: Abnormal   Collection Time: 10/27/14  4:33 AM  Result Value Ref Range   Sodium 138 135 - 145 mmol/L   Potassium 3.8 3.5 - 5.1 mmol/L   Chloride 96 (L) 101 - 111 mmol/L   CO2 31 22 - 32 mmol/L   Glucose, Bld 59 (L) 65 - 99 mg/dL   BUN 9 6 - 20 mg/dL   Creatinine, Ser 0.40 (L) 0.44 - 1.00 mg/dL   Calcium 7.6 (L) 8.9 - 10.3 mg/dL   GFR calc non Af Amer >60 >60 mL/min   GFR calc Af Amer >60 >  60 mL/min   Anion gap 11 5 - 15   Micro Results: No results found for this or any previous visit (from the past 240 hour(s)). Studies/Results: Dg Chest Portable 1 View  10/26/2014   CLINICAL DATA:  Increasing weakness and fatigue  EXAM: PORTABLE CHEST - 1 VIEW  COMPARISON:  08/05/2014  FINDINGS: The heart size and mediastinal contours are within normal limits. Both lungs are clear. The visualized skeletal structures are unremarkable.  IMPRESSION: No active disease.   Electronically Signed   By: Inez Catalina M.D.   On: 10/26/2014 18:14   Medications: I have reviewed the  patient's current medications. Scheduled Meds: . budesonide  0.5 mg Nebulization BID  . DULoxetine  30 mg Oral Daily  . gabapentin  300 mg Oral TID  . [START ON 10/28/2014] lamoTRIgine  100 mg Oral QODAY  . OxyCODONE  10 mg Oral Q12H   Continuous Infusions:  PRN Meds:.ipratropium-albuterol Assessment/Plan: Active Problems:   Acute blood loss anemia   Anemia  Anemia: Due to the recurrent nature of this anemia (she was found to have a hemoglobin of 5.3 in January of this year), it could be due to some chronic blood loss, such as a GI malignancy. However, as a poor candidate for colonoscopy due to her comorbidities, other modalities may have to be used to rule out a GI bleed. - CT Colonoscopy -transfuse if Hgb <7.0 -reticulocytes 2.4%, total bili 0.1, ferritin 6 -Feraheme 510mg  x 1 given, will schedule additionally dose in one week outpatient  COPD -Continue home 02, 3LPM -Pulmicort nebs bid -Duonebs q4h prn  Depression -continue Cymbalta 30mg  daily  Bipolar disorder -continue Lamictal 100mg  every other day  Chronic pain -Oxycodone 10mg  q12h  GI/FEN:  - regular diet - Replace electrolytes as needed (K<4, Mg<2)  Prophylaxis: - SCDs  Code: DNR   Dispo: Disposition is deferred at this time, awaiting improvement of current medical problems. Anticipated discharge in approximately 2-3 day(s).   This is a Careers information officer Note.  The care of the patient was discussed with Dr. Burgess Estelle and the assessment and plan formulated with their assistance.  Please see their attached note for official documentation of the daily encounter.     Sandria Manly, Med Student 10/27/2014, 10:58 AM

## 2014-10-27 NOTE — Discharge Instructions (Signed)
Please follow up in our Internal Medicine Clinic in about a week- the clinic will call to make a follow up appointment Iron Deficiency Anemia Anemia is a condition in which there are less red blood cells or hemoglobin in the blood than normal. Hemoglobin is the part of red blood cells that carries oxygen. Iron deficiency anemia is anemia caused by too little iron. It is the most common type of anemia. It may leave you tired and short of breath. CAUSES   Lack of iron in the diet.  Poor absorption of iron, as seen with intestinal disorders.  Intestinal bleeding.  Heavy periods. SIGNS AND SYMPTOMS  Mild anemia may not be noticeable. Symptoms may include:  Fatigue.  Headache.  Pale skin.  Weakness.  Tiredness.  Shortness of breath.  Dizziness.  Cold hands and feet.  Fast or irregular heartbeat. DIAGNOSIS  Diagnosis requires a thorough evaluation and physical exam by your health care provider. Blood tests are generally used to confirm iron deficiency anemia. Additional tests may be done to find the underlying cause of your anemia. These may include:  Testing for blood in the stool (fecal occult blood test).  A procedure to see inside the colon and rectum (colonoscopy).  A procedure to see inside the esophagus and stomach (endoscopy). TREATMENT  Iron deficiency anemia is treated by correcting the cause of the deficiency. Treatment may involve:  Adding iron-rich foods to your diet.  Taking iron supplements. Pregnant or breastfeeding women need to take extra iron because their normal diet usually does not provide the required amount.  Taking vitamins. Vitamin C improves the absorption of iron. Your health care provider may recommend that you take your iron tablets with a glass of orange juice or vitamin C supplement.  Medicines to make heavy menstrual flow lighter.  Surgery. HOME CARE INSTRUCTIONS   Take iron as directed by your health care provider.  If you cannot  tolerate taking iron supplements by mouth, talk to your health care provider about taking them through a vein (intravenously) or an injection into a muscle.  For the best iron absorption, iron supplements should be taken on an empty stomach. If you cannot tolerate them on an empty stomach, you may need to take them with food.  Do not drink milk or take antacids at the same time as your iron supplements. Milk and antacids may interfere with the absorption of iron.  Iron supplements can cause constipation. Make sure to include fiber in your diet to prevent constipation. A stool softener may also be recommended.  Take vitamins as directed by your health care provider.  Eat a diet rich in iron. Foods high in iron include liver, lean beef, whole-grain bread, eggs, dried fruit, and dark green leafy vegetables. SEEK IMMEDIATE MEDICAL CARE IF:   You faint. If this happens, do not drive. Call your local emergency services (911 in [place] if no other help is available.  You have chest pain.  You feel nauseous or vomit.  You have severe or increased shortness of breath with activity.  You feel weak.  You have a rapid heartbeat.  You have unexplained sweating.  You become light-headed when getting up from a chair or bed. MAKE SURE YOU:   Understand these instructions.  Will watch your condition.  Will get help right away if you are not doing well or get worse. Document Released: 02/26/2000 Document Revised: 03/05/2013 Document Reviewed: 11/05/2012 [facility] Patient Information 2015 [place], [place]. This information is not intended to replace  advice given to you by your health care provider. Make sure you discuss any questions you have with your health care provider.

## 2014-10-27 NOTE — Care Management Note (Signed)
Case Management Note  Patient Details  Name: [name] MRN: [id number] Date of Birth: [birth year]  Subjective/Objective:           anemia         Action/Plan:  Home  Expected Discharge Date:  10/27/2014              Expected Discharge Plan:  Home/Self Care  In-House Referral:  Clinical Social Work  Discharge planning Services  CM Consult     Status of Service:  Completed, signed off  Medicare Important Message Given:    Date Medicare IM Given:    Medicare IM give by:    Date Additional Medicare IM Given:    Additional Medicare Important Message give by:     If discussed at [place] of Stay Meetings, dates discussed:    Additional Comments: NCM spoke to pt and states she has her Rollator and oxygen here with her in the hospital. States she has an aide that comes daily paid for by her Medicaid. She is requesting a ride home. CSW for transportation for home.  Erenest Rasher, RN 10/27/2014, 1:31 PM

## 2014-10-27 NOTE — Telephone Encounter (Signed)
Left a voicemail on the patient's cell phone at [phone] informing that her hospital follow up appointment is at 10:45 on 8/22 with Dr Charlynn Grimes

## 2014-10-27 NOTE — Discharge Summary (Signed)
Name: [name] MRN: [id number] DOB: 1950-12-24 64 y.o. PCP: Riccardo Dubin, MD  Date of Admission: 10/26/2014  5:00 PM Date of Discharge: 10/27/2014 Attending Physician: Annia Belt, MD  Discharge Diagnosis: 1. Iron Deficiency Anemia Active Problems:   Acute blood loss anemia   Anemia  Discharge Medications:   Medication List    TAKE these medications        alendronate 70 MG tablet  Commonly known as:  FOSAMAX  Take 1 tablet (70 mg total) by mouth once a week. Take with a full glass of water on an empty stomach.     budesonide 0.5 MG/2ML nebulizer solution  Commonly known as:  PULMICORT  Take 2 mLs (0.5 mg total) by nebulization 2 (two) times daily. DX j43.8     calcium carbonate 500 MG chewable tablet  Commonly known as:  TUMS - dosed in mg elemental calcium  CHEW ONE TABLET THREE TIMES DAILY WITH MEALS     DULoxetine 30 MG capsule  Commonly known as:  CYMBALTA  Take 30 mg by mouth daily.     ferrous sulfate 325 (65 FE) MG tablet  Take 1 tablet (325 mg total) by mouth daily. Take once a day or every other day, as tolerated.     fluticasone 50 MCG/ACT nasal spray  Commonly known as:  FLONASE  Place 1 spray into both nostrils daily.     Fluticasone-Salmeterol 250-50 MCG/DOSE Aepb  Commonly known as:  ADVAIR DISKUS  INHALE 1 PUFF INTO THE LUNGS 2 TIMES DAILY     gabapentin 300 MG capsule  Commonly known as:  NEURONTIN  Take 300 mg by mouth 3 (three) times daily.     ipratropium-albuterol 0.5-2.5 (3) MG/3ML Soln  Commonly known as:  DUONEB  4 times a day (breakfast, lunch, dinner, bedtime).Can take 2 additional treatments if needed on bad days. DX J43.8     lamoTRIgine 100 MG tablet  Commonly known as:  LAMICTAL  TAKE ONE TABLET EVERY DAY     loratadine 10 MG tablet  Commonly known as:  CLARITIN  Take 10 mg by mouth daily.     naproxen 500 MG tablet  Commonly known as:  NAPROSYN  Take 500 mg by mouth 2 (two) times daily with a meal.     ondansetron 4 MG disintegrating tablet  Commonly known as:  ZOFRAN ODT  Take 1 tablet (4 mg total) by mouth every 8 (eight) hours as needed for nausea or vomiting.     Oxycodone HCl 10 MG Tabs  Take 10 mg by mouth every 12 (twelve) hours.     polyethylene glycol powder powder  Commonly known as:  GLYCOLAX/MIRALAX  TAKE 17G DISSOLVED IN 8OZ WATER DAILY ASNEEDED FOR MODERATE CONSTIPATION     PROAIR HFA 108 (90 BASE) MCG/ACT inhaler  Generic drug:  albuterol  INHALE ONE PUFF BY MOUTH EVERY 2 TO 4 HOURS AS NEEDED FOR WHEEZING     Vitamin D (Ergocalciferol) 50000 UNITS Caps capsule  Commonly known as:  DRISDOL  Take 1 capsule (50,000 Units total) by mouth every 7 (seven) days.     zolpidem 5 MG tablet  Commonly known as:  AMBIEN  Take 5 mg by mouth at bedtime.        Disposition and follow-up:   [name] was discharged from [facility] in Good condition.  At the hospital follow up visit please address:  1.  Please arrange for CT colonoscopy- per  GI- will need prior auth as can't do inpatient, order already placed in   Recommended to give another ferraheme next week Repeat CBC to follow up on HgB  2.  Labs / imaging needed at time of follow-up: CBC  3.  Pending labs/ test needing follow-up: CT colonoscopy  Follow-up Appointments:   Discharge Instructions:     Discharge Instructions    Call MD for:  difficulty breathing, headache or visual disturbances    Complete by:  As directed      Call MD for:  persistant dizziness or light-headedness    Complete by:  As directed      Diet - low sodium heart healthy    Complete by:  As directed      Discharge instructions    Complete by:  As directed   Please follow up in our Internal Medicine Clinic in about a week- the clinic will call to make a follow up appointment     Increase activity slowly    Complete by:  As directed            Consultations:    Procedures Performed:  Dg Bone  Density  10/03/2014   CLINICAL DATA:  64 year old postmenopausal female.  EXAM: DUAL X-RAY ABSORPTIOMETRY (DXA) FOR BONE MINERAL DENSITY  FINDINGS: LEFT FEMUR NECK  Bone Mineral Density (BMD):  0.488 g/cm2  Young Adult T-Score: -3.3  Z-Score:  -1.8  LEFT FOREARM (1/3 RADIUS)  Bone Mineral Density (BMD):  0.528  Young Adult T Score:  -2.8  Z Score:  -1.2  ASSESSMENT: Patient's diagnostic category is OSTEOPOROSIS by WHO Criteria.  FRACTURE RISK: INCREASED  FRAX: World Health Organization FRAX assessment of absolute fracture risk is not calculated for this patient because the patient has osteoporosis.  COMPARISON: None.  Effective therapies are available in the form of bisphosphonates, selective estrogen receptor modulators, biologic agents, and hormone replacement therapy (for women). All patients should ensure an adequate intake of dietary calcium (1200 mg daily) and vitamin D (800 IU daily) unless contraindicated.  All treatment decisions require clinical judgment and consideration of individual patient factors, including patient preferences, co-morbidities, previous drug use, risk factors not captured in the FRAX model (e.g., frailty, falls, vitamin D deficiency, increased bone turnover, interval significant decline in bone density) and possible under- or over-estimation of fracture risk by FRAX.  The National Osteoporosis Foundation recommends that FDA-approved medical therapies be considered in postmenopausal women and men age 4 or older with a:  1. Hip or vertebral (clinical or morphometric) fracture.  2. T-score of -2.5 or lower at the spine or hip.  3. Ten-year fracture probability by FRAX of 3% or greater for hip fracture or 20% or greater for major osteoporotic fracture.  People with diagnosed cases of osteoporosis or at high risk for fracture should have regular bone mineral density tests. For patients eligible for Medicare, routine testing is allowed once every 2 years. The testing frequency can be  increased to one year for patients who have rapidly progressing disease, those who are receiving or discontinuing medical therapy to restore bone mass, or have additional risk factors.  World Pharmac[facility]) Criteria:  Normal: T-scores from +1.0 to -1.0  Low Bone Mass (Osteopenia): T-scores between -1.0 and -2.5  Osteoporosis: T-scores -2.5 and below  Comparison to Reference Population:  T-score is the key measure used in the diagnosis of osteoporosis and relative risk determination for fracture. It provides a value for bone mass relative to the mean bone mass  of a young adult reference population expressed in terms of standard deviation (SD).  Z-score is the age-matched score showing the patient's values compared to a population matched for age, sex, and race. This is also expressed in terms of standard deviation. The patient may have values that compare favorably to the age-matched values and still be at increased risk for fracture.   Electronically Signed   By: Lovey Newcomer M.D.   On: 10/03/2014 12:48   Dg Chest Portable 1 View  10/26/2014   CLINICAL DATA:  Increasing weakness and fatigue  EXAM: PORTABLE CHEST - 1 VIEW  COMPARISON:  08/05/2014  FINDINGS: The heart size and mediastinal contours are within normal limits. Both lungs are clear. The visualized skeletal structures are unremarkable.  IMPRESSION: No active disease.   Electronically Signed   By: Inez Catalina M.D.   On: 10/26/2014 18:14    2D Echo:  Cardiac Cath:   Admission HPI:   [name] is a 64 y.o. caucasian female with past medical history of depression, bipolar disorder, tobacco abuse history, COPD requiring 3L O2 via nasal cannula at home (no PFTs in her chart), iron deficiency anemia, osteoporosis who presents with fatigue.  She was recently seen in clinic where she similarly complained of feeling tired, achy, and "just doesn't feel good."  Thought to be related in part to polypharmacy, she had many medicines  discontinued (Zyprexa, Abilify, Ambien, Protonix) and had her Percocet decreased from 10-325 to 5-325.  She presented to the emergency department this evening with worsening symptoms of fatigue, being sleepy, SOB worse than her baseline, and feeling weak.  She denies any dizziness or lightheadedness, denies chest pain.  CBC drawn in the emergency department revealed a hemoglobin of 7.6 (down from 11.3 on 09/05/14 and from her baseline of approximately 9) and hematocrit of 24.9 (down from 34.5 in June).  Her MCV was normal at 95.8 and platelets elevated at 598.  She is prescribed oral iron supplements and reports compliance with once per day dosing. However, upon reviewing prior notes she has been non-compliant with this in the past.  She denies starting any new medications recently and denies any signs of blood loss such as blood in her stool, dark or tarry stool, hematemesis, hemoptysis, hematuria.  FOBT performed in the ED was negative.  Hospital Course by problem list: Active Problems:   Acute blood loss anemia   Anemia   1. Anemia: The patient had been admitted in January of this year for similar symptomatic anemia. Due to her hemodynamically stable presentation and hemoglobin of 7.16, she was not transfused during this admission. She was found to have a ferritin of 6 ng/mL, and she received one dose of Feraheme 510mg  IV. Due to the recurrent anemia and suspicion for a GI bleed, gastroenterology was consulted but deemed her a poor endoscopy or colonoscopy candidate due to her history of COPD. She started feeling better after the Feraheme and her hemoglobin was 7.9 on discharge. Plans for a virtual colonoscopy via CT scan outpatient were made as well. Also plan to give another ferraheme next week as insurance does not allow more than one ferraheme per week. Her iron was 29 in June 2016. Anemia panel studies from August 2015 show: iron 14, TIBC 360, ferritin 10, folate 13.7, vitamin b12 961 (423 in May  2016). She's also had a negative celiac panel workup in September 2014. TSH was 3.9 in May 2016. She also has an outstanding order for H.  Pylori stool antigen to consider source of anemia. However, she is without signs and symptoms of gastritis. Most recent labs on this admission were reticulocytes 2.4%, total bili 0.1, ferritin 6  All other chronic medical conditions were stable upon admission and home medications were continued.  Discharge Vitals:   BP 143/58 mmHg  Pulse 62  Temp(Src) 98.3 F (36.8 C) (Oral)  Resp 17  Ht 5\' 1"  (1.549 m)  Wt 96 lb 12.5 oz (43.9 kg)  BMI 18.30 kg/m2  SpO2 97%  Discharge Labs:  Results for orders placed or performed during the hospital encounter of 10/26/14 (from the past 24 hour(s))  Hepatic function panel     Status: Abnormal   Collection Time: 10/26/14  5:06 PM  Result Value Ref Range   Total Protein 5.6 (L) 6.5 - 8.1 g/dL   Albumin 3.0 (L) 3.5 - 5.0 g/dL   AST 19 15 - 41 U/L   ALT 11 (L) 14 - 54 U/L   Alkaline Phosphatase 76 38 - 126 U/L   Total Bilirubin 0.1 (L) 0.3 - 1.2 mg/dL   Bilirubin, Direct <0.1 (L) 0.1 - 0.5 mg/dL   Indirect Bilirubin NOT CALCULATED 0.3 - 0.9 mg/dL  CBG monitoring, ED     Status: None   Collection Time: 10/26/14  5:29 PM  Result Value Ref Range   Glucose-Capillary 95 65 - 99 mg/dL  Basic metabolic panel     Status: Abnormal   Collection Time: 10/26/14  5:42 PM  Result Value Ref Range   Sodium 140 135 - 145 mmol/L   Potassium 3.8 3.5 - 5.1 mmol/L   Chloride 96 (L) 101 - 111 mmol/L   CO2 33 (H) 22 - 32 mmol/L   Glucose, Bld 91 65 - 99 mg/dL   BUN 6 6 - 20 mg/dL   Creatinine, Ser 0.38 (L) 0.44 - 1.00 mg/dL   Calcium 7.9 (L) 8.9 - 10.3 mg/dL   GFR calc non Af Amer >60 >60 mL/min   GFR calc Af Amer >60 >60 mL/min   Anion gap 11 5 - 15  CBC     Status: Abnormal   Collection Time: 10/26/14  5:42 PM  Result Value Ref Range   WBC 7.2 4.0 - 10.5 K/uL   RBC 2.60 (L) 3.87 - 5.11 MIL/uL   Hemoglobin 7.6 (L) 12.0  - 15.0 g/dL   HCT 24.9 (L) 36.0 - 46.0 %   MCV 95.8 78.0 - 100.0 fL   MCH 29.2 26.0 - 34.0 pg   MCHC 30.5 30.0 - 36.0 g/dL   RDW 16.6 (H) 11.5 - 15.5 %   Platelets 598 (H) 150 - 400 K/uL  Troponin I     Status: None   Collection Time: 10/26/14  5:42 PM  Result Value Ref Range   Troponin I <0.03 <0.031 ng/mL  POC occult blood, ED     Status: None   Collection Time: 10/26/14  7:13 PM  Result Value Ref Range   Fecal Occult Bld NEGATIVE NEGATIVE  Urinalysis, Routine w reflex microscopic (not at [facility])     Status: Abnormal   Collection Time: 10/26/14  7:18 PM  Result Value Ref Range   Color, Urine YELLOW YELLOW   APPearance CLOUDY (A) CLEAR   Specific Gravity, Urine 1.014 1.005 - 1.030   pH 5.0 5.0 - 8.0   Glucose, UA NEGATIVE NEGATIVE mg/dL   Hgb urine dipstick NEGATIVE NEGATIVE   Bilirubin Urine NEGATIVE NEGATIVE   Ketones, ur NEGATIVE NEGATIVE mg/dL  Protein, ur NEGATIVE NEGATIVE mg/dL   Urobilinogen, UA 0.2 0.0 - 1.0 mg/dL   Nitrite NEGATIVE NEGATIVE   Leukocytes, UA NEGATIVE NEGATIVE  Type and screen     Status: None   Collection Time: 10/26/14  7:47 PM  Result Value Ref Range   ABO/RH(D) O POS    Antibody Screen NEG    Sample Expiration 10/29/2014   Reticulocytes     Status: Abnormal   Collection Time: 10/26/14  9:45 PM  Result Value Ref Range   Retic Ct Pct 2.4 0.4 - 3.1 %   RBC. 2.63 (L) 3.87 - 5.11 MIL/uL   Retic Count, Manual 63.1 19.0 - 186.0 K/uL  Ferritin     Status: Abnormal   Collection Time: 10/26/14  9:45 PM  Result Value Ref Range   Ferritin 6 (L) 11 - 307 ng/mL  Save smear     Status: None   Collection Time: 10/26/14  9:45 PM  Result Value Ref Range   Smear Review SMEAR STAINED AND AVAILABLE FOR REVIEW   CBC     Status: Abnormal   Collection Time: 10/27/14  4:33 AM  Result Value Ref Range   WBC 6.7 4.0 - 10.5 K/uL   RBC 2.66 (L) 3.87 - 5.11 MIL/uL   Hemoglobin 7.9 (L) 12.0 - 15.0 g/dL   HCT 25.7 (L) 36.0 - 46.0 %   MCV 96.6 78.0 - 100.0 fL   MCH  29.7 26.0 - 34.0 pg   MCHC 30.7 30.0 - 36.0 g/dL   RDW 16.8 (H) 11.5 - 15.5 %   Platelets 618 (H) 150 - 400 K/uL  Basic metabolic panel     Status: Abnormal   Collection Time: 10/27/14  4:33 AM  Result Value Ref Range   Sodium 138 135 - 145 mmol/L   Potassium 3.8 3.5 - 5.1 mmol/L   Chloride 96 (L) 101 - 111 mmol/L   CO2 31 22 - 32 mmol/L   Glucose, Bld 59 (L) 65 - 99 mg/dL   BUN 9 6 - 20 mg/dL   Creatinine, Ser 0.40 (L) 0.44 - 1.00 mg/dL   Calcium 7.6 (L) 8.9 - 10.3 mg/dL   GFR calc non Af Amer >60 >60 mL/min   GFR calc Af Amer >60 >60 mL/min   Anion gap 11 5 - 15    Signed: Burgess Estelle, MD 10/27/2014, 12:37 PM    Services Ordered on Discharge:  Equipment Ordered on Discharge:

## 2014-10-27 NOTE — Clinical Social Work Note (Signed)
Patient medically stable for discharge today and transport home requested. CSW provided patient with cab voucher for transport home. CSW signing off, please advise if other services needed prior to discharge.   Emmanuel Gruenhagen Givens, MSW, LCSW Licensed Clinical Social Worker [place] [phone]

## 2014-10-27 NOTE — Progress Notes (Signed)
Patient Discharge: Disposition: Patient discharged to home. Education: Patient educated about medications, follow-up appointment and discharge instructions. IV: Discontinued before discharge. Telemetry: Discontinued before discharge, CCMD notified. Transportation: Patient was given taxi voucher by the SW, staff transported patient in w/c to taxi. Belongings: patient took all her belonging with her.

## 2014-10-27 NOTE — Progress Notes (Signed)
Subjective: No acute events overnight Pt received ferraheme overnight  Still feels a little weak Will go for ct colonography  Objective: Vital signs in last 24 hours: Filed Vitals:   10/26/14 2302 10/27/14 0534 10/27/14 0833 10/27/14 1006  BP:  151/66  143/58  Pulse:  69  62  Temp:  97.9 F (36.6 C)  98.3 F (36.8 C)  TempSrc:  Oral  Oral  Resp:  14  17  Height:      Weight:      SpO2: 99% 99% 92% 97%   Weight change:   Intake/Output Summary (Last 24 hours) at 10/27/14 1120 Last data filed at 10/27/14 1019  Gross per 24 hour  Intake    900 ml  Output    600 ml  Net    300 ml    General: A&O, with home 3L nasal cannula HEENT: EOMI, PERRLA, conj pallor,  Neck: supple, midline trachea, no cervical LAD CV: RRR, normal s1, s2, no m/r/g, no carotid bruits appreciated, no JVD appreciated Resp: equal and symmetric breath sounds, End expiratory wheezing heard throughout the lungs Abdomen: soft, nontender, nondistended, +BS in all 4 quadrants Skin: pale,  warm, dry, intact, no open lesions or rashes noted Extremities: pulses intact b/l, no edema, clubbing or cyanosis,  Neurologic: no focal neuro deficits   Lab Results: @LABTEST2 @ CBC Latest Ref Rng 10/27/2014 10/26/2014 09/05/2014  WBC 4.0 - 10.5 K/uL 6.7 7.2 9.4  Hemoglobin 12.0 - 15.0 g/dL 7.9(L) 7.6(L) 11.3(L)  Hematocrit 36.0 - 46.0 % 25.7(L) 24.9(L) 34.5(L)  Platelets 150 - 400 K/uL 618(H) 598(H) 422(H)    BMP Latest Ref Rng 10/27/2014 10/26/2014 09/05/2014  Glucose 65 - 99 mg/dL 59(L) 91 94  BUN 6 - 20 mg/dL 9 6 13   Creatinine 0.44 - 1.00 mg/dL 0.40(L) 0.38(L) 0.65  Sodium 135 - 145 mmol/L 138 140 143  Potassium 3.5 - 5.1 mmol/L 3.8 3.8 4.8  Chloride 101 - 111 mmol/L 96(L) 96(L) 103  CO2 22 - 32 mmol/L 31 33(H) 27  Calcium 8.9 - 10.3 mg/dL 7.6(L) 7.9(L) 8.7     Micro Results: No results found for this or any previous visit (from the past 240 hour(s)). Studies/Results: Dg Chest Portable 1 View  10/26/2014    CLINICAL DATA:  Increasing weakness and fatigue  EXAM: PORTABLE CHEST - 1 VIEW  COMPARISON:  08/05/2014  FINDINGS: The heart size and mediastinal contours are within normal limits. Both lungs are clear. The visualized skeletal structures are unremarkable.  IMPRESSION: No active disease.   Electronically Signed   By: Inez Catalina M.D.   On: 10/26/2014 18:14   Medications: I have reviewed the patient's current medications. Scheduled Meds: . budesonide  0.5 mg Nebulization BID  . DULoxetine  30 mg Oral Daily  . gabapentin  300 mg Oral TID  . [START ON 10/28/2014] lamoTRIgine  100 mg Oral QODAY  . OxyCODONE  10 mg Oral Q12H   Continuous Infusions:  PRN Meds:.ipratropium-albuterol Assessment/Plan: Active Problems:   Acute blood loss anemia   Anemia 64 y.o. caucasian female with past medical history of depression, bipolar disorder, tobacco abuse history, COPD requiring 3L O2 via nasal cannula at home (no PFTs in her chart), iron deficiency anemia, osteoporosis, who presents with weakness and fatigue.  Anemia, iron deficiency- 3-4 day history of weakness and fatigue with h/o IDA with recent hosp in Jan '16 with hgb 5.3- had received 2 units of PRBC with hgb 9 after that. GI did not recommend colonoscopy  due to her existing comorbidities likw copd. Has history of poor iron compliance, Her iron was 29 in June 2016. Most recent other anemia panel studies from August 2015 show: iron 14, TIBC 360, ferritin 10, folate 13.7, vitamin b12 961 (423 in May 2016). She's also had a negative celiac panel workup in September 2014. TSH was 3.9 in May 2016. She also has an outstanding order for H. Pylori stool antigen to consider source of anemia. However, she is without signs and symptoms of gastritis.  - CT Colonoscopy, since she is not a candidate for colonoscopy per GI  -transfuse if Hgb <7.0 -reticulocytes 2.4%, total bili 0.1, ferritin 6 -Feraheme 510mg  x 1 given, will schedule additionally dose in one week  outpatient   COPD -stable, requires 3L home oxygen via nasal cannula -Pulmicort nebs bid -Duonebs q4h prn  Depression -stable -continue Cymbalta 30mg  daily  Bipolar disorder -stable -continue Lamictal 100mg  every other day  Chronic pain -stable -Oxycodone 10mg  q12h  Diet: regular  DVT: SCDs  Code: DNR  Dispo: Disposition is deferred at this time, awaiting improvement of current medical problems.  Anticipated discharge in approximately 1 day(s).   The patient does have a current PCP (Rushil Sherrye Payor, MD) and does need an [facility] hospital follow-up appointment after discharge.  The patient does not have transportation limitations that hinder transportation to clinic appointments.  .Services Needed at time of discharge: Y = Yes, Blank = No PT:   OT:   RN:   Equipment:   Other:       Burgess Estelle, MD 10/27/2014, 11:20 AM

## 2014-10-28 ENCOUNTER — Ambulatory Visit: Payer: Self-pay | Admitting: Internal Medicine

## 2014-10-29 ENCOUNTER — Telehealth: Payer: Self-pay | Admitting: Internal Medicine

## 2014-10-29 NOTE — Telephone Encounter (Signed)
Call to patient to follow up from hospital stay and to verify pt is aware of appointment for 11/03/14 at 10:45 pt is doing well and confirmed appointment

## 2014-11-03 ENCOUNTER — Encounter: Payer: Self-pay | Admitting: Internal Medicine

## 2014-11-03 ENCOUNTER — Ambulatory Visit (INDEPENDENT_AMBULATORY_CARE_PROVIDER_SITE_OTHER): Payer: Medicare Other | Admitting: Internal Medicine

## 2014-11-03 VITALS — BP 140/59 | HR 79 | Temp 98.2°F | Ht 61.0 in | Wt 96.0 lb

## 2014-11-03 DIAGNOSIS — D509 Iron deficiency anemia, unspecified: Secondary | ICD-10-CM

## 2014-11-03 DIAGNOSIS — Z9981 Dependence on supplemental oxygen: Secondary | ICD-10-CM | POA: Diagnosis not present

## 2014-11-03 DIAGNOSIS — F17210 Nicotine dependence, cigarettes, uncomplicated: Secondary | ICD-10-CM

## 2014-11-03 DIAGNOSIS — F17200 Nicotine dependence, unspecified, uncomplicated: Secondary | ICD-10-CM

## 2014-11-03 DIAGNOSIS — Z79899 Other long term (current) drug therapy: Secondary | ICD-10-CM

## 2014-11-03 NOTE — Assessment & Plan Note (Signed)
Patient reports compliance with her medications. She did not bring her medications with her today so it is difficult to determine if she is compliant or not. Was admitted to the hospital on 8/14 for iron def anemia (see Fe def anemia note for details). Reports her symptoms of fatigue have improved and she feels back to her baseline today. Likely symptoms 2/2 anemia but polypharmacy could have played a role. Continues to take Percocet 10 mg pills. She receives these from pain clinic so if we want to cut her dose back we would have to contact them. She appears less lethargic today though I still have concerns about her dose given her poor respiratory status. Will not make any changes today but should be monitored in the future with a decrease in dose if her respiratory status declines or she has continued excessive drowsiness and fatigue.   - RTC in 1 month with PCP - Reminded to bring all of her medications with her to next visit, unclear exactly what medications she is taking - Gave her a list of what medications we want her on again today

## 2014-11-03 NOTE — Assessment & Plan Note (Signed)
Continues to smoke 1 cigarette a day. Does not smoke near her O2.

## 2014-11-03 NOTE — Patient Instructions (Signed)
General Instructions: Thank you for coming in today. I'm glad you are feeling better.  1. Please continue to take your medications as prescribed. The Iron Supplement is a daily medication. 2. We will check your Blood levels today, I will call you if your levels are too low 3. We will set you up to come back to the hospital for an IV Iron infusion 4. Please come back to clinic in 1 month for follow up with your PCP.  5. Please remember to bring all of your medications with you to your next visit.

## 2014-11-03 NOTE — Progress Notes (Signed)
Patient ID: [name], female   DOB: [birth year], 64 y.o.   MRN: [id number]   Subjective:   Patient ID: [name] female   DOB: 11-05-1950 64 y.o.   MRN: [id number]  HPI: [name] is a 64 y.o. female with a PMH listed below here today for hospital follow up.   For details of today's visit please refer to problem based charting.   Past Medical History  Diagnosis Date  . Depression   . Suicidal ideation 2007     attempted overdose 2012  Dr. Tivis Ringer report  . Bipolar disorder   . Substance abuse      narcotics, alcohol, tobacco  . Chronic pain syndrome     follows at [facility]  . DEFICIENCY, VITAMIN D NOS 01/03/2007  . Chronic lower back pain   . Anxiety   . OSTEOPOROSIS 06/17/2009    DEXA 05/2009 : L femur -2.9; R femur -2.5. Alendronate on med list but not taking. Needs addressed ASAP as h/o fractures.    . Elevated liver function tests   . On home oxygen therapy     "3L; 24/7" (03/20/2014)  . Shortness of breath     "all the time" (02/27/2013)  . 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 Sciatic pain   . Anemia   . Pneumonia X 1?  . Hyperthyroidism     "borderline"  . History of blood transfusion     "getting my 1st later tonight" (03/20/2014)  . Arthritis     "legs, back; hips, primarily left hip" (03/20/2014)   Current Outpatient Prescriptions  Medication Sig Dispense Refill  . alendronate (FOSAMAX) 70 MG tablet Take 1 tablet (70 mg total) by mouth once a week. Take with a full glass of water on an empty stomach. (Patient taking differently: Take 70 mg by mouth every Sunday. Take with a full glass of water on an empty stomach.) 4 tablet 0  . budesonide (PULMICORT) 0.5 MG/2ML nebulizer solution Take 2 mLs (0.5 mg total) by nebulization 2 (two) times daily. DX j43.8 (Patient taking differently: Take 0.5 mg by nebulization every 4 (four) hours. DX j43.8) 120 mL 3  . calcium carbonate (TUMS - DOSED IN  MG ELEMENTAL CALCIUM) 500 MG chewable tablet CHEW ONE TABLET THREE TIMES DAILY WITH MEALS 90 tablet 1  . DULoxetine (CYMBALTA) 30 MG capsule Take 30 mg by mouth daily.     . ferrous sulfate 325 (65 FE) MG tablet Take 1 tablet (325 mg total) by mouth daily. Take once a day or every other day, as tolerated. 30 tablet 3  . fluticasone (FLONASE) 50 MCG/ACT nasal spray Place 1 spray into both nostrils daily. 16 g 1  . Fluticasone-Salmeterol (ADVAIR DISKUS) 250-50 MCG/DOSE AEPB INHALE 1 PUFF INTO THE LUNGS 2 TIMES DAILY 5 each 6  . gabapentin (NEURONTIN) 300 MG capsule Take 300 mg by mouth 3 (three) times daily.     Marland Kitchen ipratropium-albuterol (DUONEB) 0.5-2.5 (3) MG/3ML SOLN 4 times a day (breakfast, lunch, dinner, bedtime).Can take 2 additional treatments if needed on bad days. DX J43.8 360 mL 3  . lamoTRIgine (LAMICTAL) 100 MG tablet TAKE ONE TABLET EVERY DAY 30 tablet 2  . loratadine (CLARITIN) 10 MG tablet Take 10 mg by mouth daily.    . naproxen (NAPROSYN) 500 MG tablet Take 500 mg by mouth 2 (two) times daily with a meal.     . ondansetron (ZOFRAN ODT)  4 MG disintegrating tablet Take 1 tablet (4 mg total) by mouth every 8 (eight) hours as needed for nausea or vomiting. 40 tablet 0  . Oxycodone HCl 10 MG TABS Take 10 mg by mouth every 12 (twelve) hours.     . polyethylene glycol powder (GLYCOLAX/MIRALAX) powder TAKE 17G DISSOLVED IN 8OZ WATER DAILY ASNEEDED FOR MODERATE CONSTIPATION 527 g 2  . PROAIR HFA 108 (90 BASE) MCG/ACT inhaler INHALE ONE PUFF BY MOUTH EVERY 2 TO 4 HOURS AS NEEDED FOR WHEEZING 8.5 g 12  . Vitamin D, Ergocalciferol, (DRISDOL) 50000 UNITS CAPS capsule Take 1 capsule (50,000 Units total) by mouth every 7 (seven) days. (Patient taking differently: Take 50,000 Units by mouth every Sunday. ) 8 capsule 0  . zolpidem (AMBIEN) 5 MG tablet Take 5 mg by mouth at bedtime.     No current facility-administered medications for this visit.   Family History  Problem Relation Age of Onset  .  Stroke Neg Hx   . Cancer Neg Hx   . Myelodysplastic syndrome Father     Died from myelofibrosis though diagnosed post-mortem   Social History   Social History  . Marital Status: Divorced    Spouse Name: N/A  . Number of Children: 2  . Years of Education: N/A   Occupational History  . retired    Social History Main Topics  . Smoking status: Current Every Day Smoker -- 0.20 packs/day for 45 years    Types: Cigarettes  . Smokeless tobacco: Never Used     Comment: 03/20/2014 "smoking 2 cigarettes/day; that's down from 2 ppd"  . Alcohol Use: 0.0 oz/week    0 Standard drinks or equivalent per week     Comment: 03/20/2014 "nothing in 7 years; never had a problem w/it"  . Drug Use: No  . Sexual Activity: No   Other Topics Concern  . 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  Respiratory: Negative for cough and shortness of breath.   Cardiovascular: Negative for chest pain.  Gastrointestinal: Positive for nausea, vomiting and abdominal pain. Negative for heartburn, diarrhea, constipation, blood in stool and melena.  Skin: Negative for rash.  Neurological: Negative for dizziness and weakness.   Objective:  Physical Exam: Filed Vitals:   11/03/14 1015  BP: 140/59  Pulse: 79  Temp: 98.2 F (36.8 C)  TempSrc: Oral  Height: 5\' 1"  (1.549 m)  Weight: 96 lb (43.545 kg)  SpO2: 98%   Constitutional: Vital signs reviewed.  Patient is a under nourished appearing frail female in no acute distress and cooperative with exam. Alert and oriented x3.  Head: Normocephalic and atraumatic Mouth: no erythema or exudates, MMM Eyes: PERRL, EOMI, conjunctivae normal, No scleral icterus.    Neck: Supple, Trachea midline normal ROM, No JVD, mass, thyromegaly, or carotid bruit present.  Cardiovascular: RRR, S1 normal, S2 normal, no MRG, pulses symmetric and intact bilaterally Pulmonary/Chest: normal respiratory effort, CTAB, no wheezes, rales, or rhonchi Abdominal: Soft. Non-tender, non-distended, bowel sounds are normal, no masses, organomegaly, or guarding present.  GU: no CVA tenderness Musculoskeletal: No joint deformities, erythema, or stiffness,  ROM full and no nontender  Neurological: A&O x3, Strength is normal and symmetric bilaterally, cranial nerve II-XII are grossly intact Skin: Warm, dry and intact. No rash, cyanosis, or clubbing.  Psychiatric: Normal mood and affect. speech and behavior is normal. Judgment and thought content normal. Cognition and memory are normal.   Assessment & Plan:   Case discussed with Dr. Dareen Piano. Please refer to Problem based carting for further details of today's visit.

## 2014-11-03 NOTE — Assessment & Plan Note (Addendum)
Recently hospitalized on 10/26/14 with complaints of fatigue, weakness, drowsiness and SOB. H/H was found to be 7.6/24.9 down from 11.3/34.5 on 09/05/14. No blood in stool, melena, hematemesis, hematuria. FOBT negative. She was hemodynamically stable and Hbg >7 so was not transfused. Ferratin was found to be 6 so was given a dose of Feraheme 510 mg IV. GI was consulted but was not deemed to be a candidate for colonoscopy or endoscopy due to her respiratory status. They wanted to have an outpatient virtual colonoscopy done. She was admitted back in January of 2016 with similar symptomatic anemia. Has Fe supplements prescribed but some question of compliance.  Today she reports she feels much improvement. SOB has returned to her baseline (on 3L O2 at home). Weakness and fatigue has resolved. Pallor looks better today than when I saw her previously 2 weeks ago. When asked about it today she reports she takes it weekly. I believe she is confusing the Fe supplementation with the Vit D. She then reported that she takes it every day. Did not bring her medications with her to clinic today. Denies any blood in stool, melena, hematemesis, hematuria.   Spoke with her about following up with GI for the virtual colonoscopy. She reports that she would not want any procedures done even if the virtual colonoscopy were to find something given that she has been told that her lungs put her at high risk for any endoscopy or colonoscopy. In light of this, there is no reason for her to follow up with GI and have the virtual colonoscopy done.  - Will check CBC today - Will get her scheduled for 2nd Ferraheme IV dose  - Encouraged compliance with her Fe supplementation - Will not refer to GI as pt does not wish any intervention even if something were to be found on work up   ADDENDUM:  - H/H stable at 7.8/25.8

## 2014-11-04 ENCOUNTER — Emergency Department (HOSPITAL_COMMUNITY): Payer: Medicare Other

## 2014-11-04 ENCOUNTER — Encounter (HOSPITAL_COMMUNITY): Payer: Self-pay

## 2014-11-04 ENCOUNTER — Other Ambulatory Visit: Payer: Self-pay

## 2014-11-04 ENCOUNTER — Observation Stay (HOSPITAL_COMMUNITY)
Admission: EM | Admit: 2014-11-04 | Discharge: 2014-11-09 | Disposition: A | Discharge status: Home / Self Care | Payer: Medicare Other | Attending: Student in an Organized Health Care Education/Training Program | Admitting: Student in an Organized Health Care Education/Training Program

## 2014-11-04 DIAGNOSIS — R1084 Generalized abdominal pain: Secondary | ICD-10-CM | POA: Diagnosis present

## 2014-11-04 DIAGNOSIS — G894 Chronic pain syndrome: Secondary | ICD-10-CM | POA: Insufficient documentation

## 2014-11-04 DIAGNOSIS — D509 Iron deficiency anemia, unspecified: Secondary | ICD-10-CM | POA: Diagnosis not present

## 2014-11-04 DIAGNOSIS — D649 Anemia, unspecified: Secondary | ICD-10-CM | POA: Diagnosis not present

## 2014-11-04 DIAGNOSIS — K3189 Other diseases of stomach and duodenum: Secondary | ICD-10-CM | POA: Diagnosis not present

## 2014-11-04 DIAGNOSIS — K259 Gastric ulcer, unspecified as acute or chronic, without hemorrhage or perforation: Secondary | ICD-10-CM | POA: Insufficient documentation

## 2014-11-04 DIAGNOSIS — J449 Chronic obstructive pulmonary disease, unspecified: Secondary | ICD-10-CM | POA: Diagnosis not present

## 2014-11-04 DIAGNOSIS — K269 Duodenal ulcer, unspecified as acute or chronic, without hemorrhage or perforation: Principal | ICD-10-CM | POA: Diagnosis present

## 2014-11-04 DIAGNOSIS — F17210 Nicotine dependence, cigarettes, uncomplicated: Secondary | ICD-10-CM | POA: Diagnosis not present

## 2014-11-04 DIAGNOSIS — M1990 Unspecified osteoarthritis, unspecified site: Secondary | ICD-10-CM | POA: Insufficient documentation

## 2014-11-04 DIAGNOSIS — J439 Emphysema, unspecified: Secondary | ICD-10-CM | POA: Diagnosis present

## 2014-11-04 DIAGNOSIS — Z7951 Long term (current) use of inhaled steroids: Secondary | ICD-10-CM | POA: Insufficient documentation

## 2014-11-04 DIAGNOSIS — Z9981 Dependence on supplemental oxygen: Secondary | ICD-10-CM | POA: Insufficient documentation

## 2014-11-04 DIAGNOSIS — K922 Gastrointestinal hemorrhage, unspecified: Secondary | ICD-10-CM | POA: Diagnosis present

## 2014-11-04 DIAGNOSIS — R55 Syncope and collapse: Secondary | ICD-10-CM | POA: Diagnosis not present

## 2014-11-04 DIAGNOSIS — F3130 Bipolar disorder, current episode depressed, mild or moderate severity, unspecified: Secondary | ICD-10-CM | POA: Diagnosis present

## 2014-11-04 DIAGNOSIS — F319 Bipolar disorder, unspecified: Secondary | ICD-10-CM | POA: Diagnosis not present

## 2014-11-04 DIAGNOSIS — R1033 Periumbilical pain: Secondary | ICD-10-CM | POA: Diagnosis not present

## 2014-11-04 DIAGNOSIS — I701 Atherosclerosis of renal artery: Secondary | ICD-10-CM | POA: Diagnosis not present

## 2014-11-04 DIAGNOSIS — R569 Unspecified convulsions: Secondary | ICD-10-CM | POA: Diagnosis not present

## 2014-11-04 LAB — BASIC METABOLIC PANEL
ANION GAP: 10 (ref 5–15)
BUN: 8 mg/dL (ref 6–20)
CALCIUM: 7 mg/dL — AB (ref 8.9–10.3)
CO2: 26 mmol/L (ref 22–32)
CREATININE: 0.41 mg/dL — AB (ref 0.44–1.00)
Chloride: 104 mmol/L (ref 101–111)
GLUCOSE: 140 mg/dL — AB (ref 65–99)
Potassium: 3.2 mmol/L — ABNORMAL LOW (ref 3.5–5.1)
Sodium: 140 mmol/L (ref 135–145)

## 2014-11-04 LAB — LIPASE, BLOOD: LIPASE: 15 U/L — AB (ref 22–51)

## 2014-11-04 LAB — CBC WITH DIFFERENTIAL/PLATELET
BASOS PCT: 0 % (ref 0–1)
Basophils Absolute: 0 10*3/uL (ref 0.0–0.1)
Eosinophils Absolute: 0 10*3/uL (ref 0.0–0.7)
Eosinophils Relative: 0 % (ref 0–5)
HCT: 19.1 % — ABNORMAL LOW (ref 36.0–46.0)
Hemoglobin: 6.1 g/dL — CL (ref 12.0–15.0)
Lymphocytes Relative: 16 % (ref 12–46)
Lymphs Abs: 1.3 10*3/uL (ref 0.7–4.0)
MCH: 30.8 pg (ref 26.0–34.0)
MCHC: 31.9 g/dL (ref 30.0–36.0)
MCV: 96.5 fL (ref 78.0–100.0)
MONO ABS: 0.5 10*3/uL (ref 0.1–1.0)
Monocytes Relative: 6 % (ref 3–12)
NEUTROS ABS: 6.2 10*3/uL (ref 1.7–7.7)
Neutrophils Relative %: 78 % — ABNORMAL HIGH (ref 43–77)
Platelets: 482 10*3/uL — ABNORMAL HIGH (ref 150–400)
RBC: 1.98 MIL/uL — ABNORMAL LOW (ref 3.87–5.11)
RDW: 17.9 % — AB (ref 11.5–15.5)
WBC: 8 10*3/uL (ref 4.0–10.5)

## 2014-11-04 LAB — I-STAT CHEM 8, ED
BUN: 9 mg/dL (ref 6–20)
Calcium, Ion: 0.88 mmol/L — ABNORMAL LOW (ref 1.13–1.30)
Chloride: 100 mmol/L — ABNORMAL LOW (ref 101–111)
Creatinine, Ser: 0.4 mg/dL — ABNORMAL LOW (ref 0.44–1.00)
Glucose, Bld: 138 mg/dL — ABNORMAL HIGH (ref 65–99)
HCT: 21 % — ABNORMAL LOW (ref 36.0–46.0)
Hemoglobin: 7.1 g/dL — ABNORMAL LOW (ref 12.0–15.0)
Potassium: 3.1 mmol/L — ABNORMAL LOW (ref 3.5–5.1)
Sodium: 138 mmol/L (ref 135–145)
TCO2: 23 mmol/L (ref 0–100)

## 2014-11-04 LAB — CBC
HEMATOCRIT: 19.7 % — AB (ref 36.0–46.0)
HEMOGLOBIN: 6.1 g/dL — AB (ref 12.0–15.0)
Hematocrit: 25.8 % — ABNORMAL LOW (ref 34.0–46.6)
Hemoglobin: 7.8 g/dL — ABNORMAL LOW (ref 11.1–15.9)
MCH: 29.4 pg (ref 26.6–33.0)
MCH: 30.7 pg (ref 26.0–34.0)
MCHC: 30.2 g/dL — AB (ref 31.5–35.7)
MCHC: 31 g/dL (ref 30.0–36.0)
MCV: 97 fL (ref 79–97)
MCV: 99 fL (ref 78.0–100.0)
PLATELETS: 986 10*3/uL — AB (ref 150–379)
Platelets: 556 10*3/uL — ABNORMAL HIGH (ref 150–400)
RBC: 2.65 x10E6/uL — CL (ref 3.77–5.28)
RBC: 1.99 MIL/uL — AB (ref 3.87–5.11)
RDW: 20 % — AB (ref 12.3–15.4)
RDW: 21.2 % — ABNORMAL HIGH (ref 11.5–15.5)
WBC: 7.5 10*3/uL (ref 3.4–10.8)
WBC: 10.9 10*3/uL — AB (ref 4.0–10.5)

## 2014-11-04 LAB — I-STAT CG4 LACTIC ACID, ED
LACTIC ACID, VENOUS: 0.66 mmol/L (ref 0.5–2.0)
Lactic Acid, Venous: 3.71 mmol/L (ref 0.5–2.0)

## 2014-11-04 LAB — GLUCOSE, CAPILLARY: GLUCOSE-CAPILLARY: 108 mg/dL — AB (ref 65–99)

## 2014-11-04 LAB — POC OCCULT BLOOD, ED: Fecal Occult Bld: POSITIVE — AB

## 2014-11-04 LAB — HEPATIC FUNCTION PANEL
ALBUMIN: 2.4 g/dL — AB (ref 3.5–5.0)
ALT: 17 U/L (ref 14–54)
AST: 18 U/L (ref 15–41)
Alkaline Phosphatase: 79 U/L (ref 38–126)
Bilirubin, Direct: <0.1 mg/dL — ABNORMAL LOW (ref 0.1–0.5)
TOTAL PROTEIN: 4.9 g/dL — AB (ref 6.5–8.1)
Total Bilirubin: 0.2 mg/dL — ABNORMAL LOW (ref 0.3–1.2)

## 2014-11-04 LAB — I-STAT TROPONIN, ED: Troponin i, poc: 0.01 ng/mL (ref 0.00–0.08)

## 2014-11-04 LAB — CBG MONITORING, ED: GLUCOSE-CAPILLARY: 91 mg/dL (ref 65–99)

## 2014-11-04 LAB — PREPARE RBC (CROSSMATCH)

## 2014-11-04 MED ORDER — MOMETASONE FURO-FORMOTEROL FUM 100-5 MCG/ACT IN AERO
2.0000 | INHALATION_SPRAY | Freq: Two times a day (BID) | RESPIRATORY_TRACT | Status: DC
  Administered 2014-11-05 – 2014-11-09 (×8): 2 via RESPIRATORY_TRACT
  Filled 2014-11-04 (×3): qty 8.8

## 2014-11-04 MED ORDER — IPRATROPIUM-ALBUTEROL 0.5-2.5 (3) MG/3ML IN SOLN
3.0000 mL | Freq: Four times a day (QID) | RESPIRATORY_TRACT | Status: DC
  Administered 2014-11-04: 3 mL via RESPIRATORY_TRACT
  Filled 2014-11-04: qty 3

## 2014-11-04 MED ORDER — IPRATROPIUM-ALBUTEROL 0.5-2.5 (3) MG/3ML IN SOLN
3.0000 mL | Freq: Four times a day (QID) | RESPIRATORY_TRACT | Status: DC
  Administered 2014-11-05 – 2014-11-09 (×17): 3 mL via RESPIRATORY_TRACT
  Filled 2014-11-04 (×18): qty 3

## 2014-11-04 MED ORDER — SODIUM CHLORIDE 0.9 % IV BOLUS (SEPSIS)
1000.0000 mL | Freq: Once | INTRAVENOUS | Status: AC
  Administered 2014-11-04: 1000 mL via INTRAVENOUS

## 2014-11-04 MED ORDER — IOHEXOL 350 MG/ML SOLN
100.0000 mL | Freq: Once | INTRAVENOUS | Status: AC | PRN
  Administered 2014-11-04: 100 mL via INTRAVENOUS

## 2014-11-04 MED ORDER — PANTOPRAZOLE SODIUM 40 MG IV SOLR: 40.0000 mg | Freq: Two times a day (BID) | INTRAVENOUS | Status: DC

## 2014-11-04 MED ORDER — OXYCODONE HCL 5 MG PO TABS
10.0000 mg | ORAL_TABLET | Freq: Two times a day (BID) | ORAL | Status: DC
Start: 2014-11-04 — End: 2014-11-05
  Administered 2014-11-04: 10 mg via ORAL
  Filled 2014-11-04 (×3): qty 2

## 2014-11-04 MED ORDER — LAMOTRIGINE 25 MG PO TABS
100.0000 mg | ORAL_TABLET | Freq: Every day | ORAL | Status: DC
  Administered 2014-11-05 – 2014-11-09 (×4): 100 mg via ORAL
  Filled 2014-11-04 (×5): qty 4

## 2014-11-04 MED ORDER — ALBUTEROL SULFATE (2.5 MG/3ML) 0.083% IN NEBU
2.5000 mg | INHALATION_SOLUTION | RESPIRATORY_TRACT | Status: DC | PRN
  Administered 2014-11-08: 2.5 mg via RESPIRATORY_TRACT
  Filled 2014-11-04: qty 3

## 2014-11-04 MED ORDER — BUTALBITAL-APAP-CAFFEINE 50-325-40 MG PO TABS
1.0000 | ORAL_TABLET | Freq: Once | ORAL | Status: AC
  Administered 2014-11-04: 1 via ORAL
  Filled 2014-11-04: qty 1

## 2014-11-04 MED ORDER — SODIUM CHLORIDE 0.9 % IV SOLN
Freq: Once | INTRAVENOUS | Status: AC
  Administered 2014-11-04: 12:00:00 via INTRAVENOUS

## 2014-11-04 MED ORDER — SODIUM CHLORIDE 0.9 % IV SOLN
80.0000 mg | Freq: Once | INTRAVENOUS | Status: AC
  Administered 2014-11-04: 80 mg via INTRAVENOUS
  Filled 2014-11-04: qty 80

## 2014-11-04 MED ORDER — ZOLPIDEM TARTRATE 5 MG PO TABS
5.0000 mg | ORAL_TABLET | Freq: Every day | ORAL | Status: DC
  Administered 2014-11-04: 5 mg via ORAL
  Filled 2014-11-04: qty 1

## 2014-11-04 MED ORDER — IOHEXOL 300 MG/ML  SOLN: 25.0000 mL | INTRAMUSCULAR | Status: AC

## 2014-11-04 MED ORDER — LORATADINE 10 MG PO TABS: 10.0000 mg | ORAL_TABLET | Freq: Every day | ORAL | Status: DC

## 2014-11-04 MED ORDER — BUDESONIDE 0.5 MG/2ML IN SUSP
0.5000 mg | Freq: Two times a day (BID) | RESPIRATORY_TRACT | Status: DC
  Administered 2014-11-05 – 2014-11-09 (×8): 0.5 mg via RESPIRATORY_TRACT
  Filled 2014-11-04 (×8): qty 2

## 2014-11-04 MED ORDER — SODIUM CHLORIDE 0.9 % IJ SOLN
3.0000 mL | INTRAMUSCULAR | Status: DC | PRN
  Administered 2014-11-05: 3 mL via INTRAVENOUS
  Filled 2014-11-04: qty 3

## 2014-11-04 MED ORDER — BUDESONIDE 0.5 MG/2ML IN SUSP: 0.5000 mg | Freq: Two times a day (BID) | RESPIRATORY_TRACT | Status: DC

## 2014-11-04 MED ORDER — SODIUM CHLORIDE 0.9 % IJ SOLN
3.0000 mL | Freq: Two times a day (BID) | INTRAMUSCULAR | Status: DC
  Administered 2014-11-04 – 2014-11-09 (×10): 3 mL via INTRAVENOUS

## 2014-11-04 MED ORDER — PANTOPRAZOLE SODIUM 40 MG IV SOLR
40.0000 mg | Freq: Two times a day (BID) | INTRAVENOUS | Status: DC
  Administered 2014-11-04 – 2014-11-07 (×6): 40 mg via INTRAVENOUS
  Filled 2014-11-04 (×6): qty 40

## 2014-11-04 MED ORDER — FLUTICASONE PROPIONATE 50 MCG/ACT NA SUSP
1.0000 | Freq: Every day | NASAL | Status: DC
  Administered 2014-11-05 – 2014-11-09 (×5): 1 via NASAL
  Filled 2014-11-04 (×2): qty 16

## 2014-11-04 MED ORDER — ALBUTEROL SULFATE HFA 108 (90 BASE) MCG/ACT IN AERS: 1.0000 | INHALATION_SPRAY | RESPIRATORY_TRACT | Status: DC | PRN

## 2014-11-04 MED ORDER — GABAPENTIN 300 MG PO CAPS
300.0000 mg | ORAL_CAPSULE | Freq: Three times a day (TID) | ORAL | Status: DC
  Administered 2014-11-04 – 2014-11-09 (×13): 300 mg via ORAL
  Filled 2014-11-04 (×14): qty 1

## 2014-11-04 NOTE — H&P (Signed)
Date: 11/04/2014               Patient Name:  [name] MRN: [id number]  DOB: [birth year] Age / Sex: 64 y.o., female   PCP: Riccardo Dubin, MD         Medical Service: Internal Medicine Teaching Service         Attending Physician: Dr. Bartholomew Crews, MD    First Contact: Dr. Loleta Chance Pager: [phone]  Second Contact: Dr. Duwaine Maxin Pager: [phone]       After Hours (After 5p/  First Contact Pager: [phone]  weekends / holidays): Second Contact Pager: [phone]   Chief Complaint: "I passed out."  History of Present Illness: [name] is a 64 year-old lady with history of COPD requiring 3 liters of oxygen at home, extensive tobacco abuse, bipolar disorder, and iron deficiency anemia who presents with a syncopal episode with pre-syncope. While smoking a cigarette outside, she started feeling short of breath, so she walked inside, felt light-headed for a moment, and passed out. Her friends said she didn't hit her head but she was jerking all over. She didn't bite her tongue or urinate nor defecate on herself. When she came to, she felt her normal self. She takes lamicatal for "mood issues" but has never had a seizure. She denied any chest pain or palpitations during the incident and recently denies any dark stools, hemoptysis, weight loss, or night sweats. She only complains of a distended, painful belly and daily vomiting which she has had since she was discharge two weeks ago. Her last bowel movement was yesterday, she is passing gas, and other than 2 cesarian sections she has had no other abdominal surgeries. In the emergency department, she got a CT angiogram given concern for ruptured abdominal aortic aneurysm which was negative, but was notable gastritis and duodenitis, without obstructive pattern.  She was recently discharged on 8/15 for anemia; there was suspicion of a gastrointestinal bleed but she did not undergo endoscopy nor colonoscopy because she was deemed a poor  candidate due to her COPD. The plan at that time was to get her a virtual CT scan which she never underwent. Additionally, during her most recent hospitalization, she got 1 dose of ferraheme. The plan was to get her another dose next week. The patient said she did receive a dose but I can't find any indication of this in the chart.  In the emergency department, she was hemodynamically stable, a CT of her head was negative, and a CTA of her abdomen was negative for AAA or obstruction but notable for gastritis and duodenitis. Her hemoglobin was 6.1, down from 7.8 the day prior in clinic. She received 1 unit of packed red blood cells and her hemoglobin rose to 7.1. Fecal occult blood test was positive.  Meds: Current Facility-Administered Medications  Medication Dose Route Frequency Provider Last Rate Last Dose  . [START ON 11/05/2014] pantoprazole (PROTONIX) injection 40 mg  40 mg Intravenous Q12H Francesca Oman, DO       Current Outpatient Prescriptions  Medication Sig Dispense Refill  . alendronate (FOSAMAX) 70 MG tablet Take 1 tablet (70 mg total) by mouth once a week. Take with a full glass of water on an empty stomach. (Patient taking differently: Take 70 mg by mouth every Sunday. Take with a full glass of water on an empty stomach.) 4 tablet 0  . budesonide (PULMICORT) 0.5 MG/2ML nebulizer solution Take 2 mLs (0.5 mg total) by  nebulization 2 (two) times daily. DX j43.8 (Patient taking differently: Take 0.5 mg by nebulization every 4 (four) hours. DX j43.8) 120 mL 3  . calcium carbonate (TUMS - DOSED IN MG ELEMENTAL CALCIUM) 500 MG chewable tablet CHEW ONE TABLET THREE TIMES DAILY WITH MEALS 90 tablet 1  . ferrous sulfate 325 (65 FE) MG tablet Take 1 tablet (325 mg total) by mouth daily. Take once a day or every other day, as tolerated. 30 tablet 3  . fluticasone (FLONASE) 50 MCG/ACT nasal spray Place 1 spray into both nostrils daily. 16 g 1  . Fluticasone-Salmeterol (ADVAIR DISKUS) 250-50 MCG/DOSE  AEPB INHALE 1 PUFF INTO THE LUNGS 2 TIMES DAILY 5 each 6  . gabapentin (NEURONTIN) 300 MG capsule Take 300 mg by mouth 3 (three) times daily.     Marland Kitchen ipratropium-albuterol (DUONEB) 0.5-2.5 (3) MG/3ML SOLN 4 times a day (breakfast, lunch, dinner, bedtime).Can take 2 additional treatments if needed on bad days. DX J43.8 360 mL 3  . lamoTRIgine (LAMICTAL) 100 MG tablet TAKE ONE TABLET EVERY DAY 30 tablet 2  . loratadine (CLARITIN) 10 MG tablet Take 10 mg by mouth daily.    . ondansetron (ZOFRAN ODT) 4 MG disintegrating tablet Take 1 tablet (4 mg total) by mouth every 8 (eight) hours as needed for nausea or vomiting. 40 tablet 0  . Oxycodone HCl 10 MG TABS Take 10 mg by mouth every 12 (twelve) hours.     . polyethylene glycol powder (GLYCOLAX/MIRALAX) powder TAKE 17G DISSOLVED IN 8OZ WATER DAILY ASNEEDED FOR MODERATE CONSTIPATION 527 g 2  . PROAIR HFA 108 (90 BASE) MCG/ACT inhaler INHALE ONE PUFF BY MOUTH EVERY 2 TO 4 HOURS AS NEEDED FOR WHEEZING 8.5 g 12  . Vitamin D, Ergocalciferol, (DRISDOL) 50000 UNITS CAPS capsule Take 1 capsule (50,000 Units total) by mouth every 7 (seven) days. (Patient taking differently: Take 50,000 Units by mouth every Sunday. ) 8 capsule 0  . zolpidem (AMBIEN) 5 MG tablet Take 5 mg by mouth at bedtime.      Allergies: Allergies as of 11/04/2014 - Review Complete 11/04/2014  Allergen Reaction Noted  . Banana Shortness Of Breath and Nausea And Vomiting 01/10/2012  . Lyrica [pregabalin] Other (See Comments) 06/26/2014  . Pollen extract Other (See Comments) 11/20/2012   Past Medical History  Diagnosis Date  . Depression   . Suicidal ideation 2007     attempted overdose 2012  Dr. Tivis Ringer report  . Bipolar disorder   . Substance abuse      narcotics, alcohol, tobacco  . Chronic pain syndrome     follows at [facility]  . DEFICIENCY, VITAMIN D NOS 01/03/2007  . Chronic lower back pain   . Anxiety   . OSTEOPOROSIS 06/17/2009    DEXA 05/2009 : L femur -2.9; R  femur -2.5. Alendronate on med list but not taking. Needs addressed ASAP as h/o fractures.    . Elevated liver function tests   . On home oxygen therapy     "3L; 24/7" (03/20/2014)  . Shortness of breath     "all the time" (02/27/2013)  . 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 Sciatic pain   . Anemia   . Pneumonia X 1?  . Hyperthyroidism     "borderline"  . History of blood transfusion     "getting my 1st later tonight" (03/20/2014)  . Arthritis     "legs, back; hips, primarily left hip" (03/20/2014)  Past Surgical History  Procedure Laterality Date  . Orif ankle fracture Left 10/2010  . Cesarean section  1974; 1979  . Tubal ligation  1979  . Augmentation mammaplasty  ~ 2007  . Fracture surgery    . Ankle debridement Left 10/2010   Family History  Problem Relation Age of Onset  . Stroke Neg Hx   . Cancer Neg Hx   . Myelodysplastic syndrome Father     Died from myelofibrosis though diagnosed post-mortem   Social History   Social History  . Marital Status: Divorced    Spouse Name: N/A  . Number of Children: 2  . Years of Education: N/A   Occupational History  . retired    Social History Main Topics  . Smoking status: Current Every Day Smoker -- 0.20 packs/day for 45 years    Types: Cigarettes  . Smokeless tobacco: Never Used     Comment: 8/22 "smoking 1 cigarettes/day; that's down from 2 ppd"  . Alcohol Use: 0.0 oz/week    0 Standard drinks or equivalent per week     Comment: 03/20/2014 "nothing in 7 years; never had a problem w/it"  . Drug Use: No  . Sexual Activity: No   Other Topics Concern  . 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  Negative for fever, chills and weight loss.  Eyes: Negative for blurred vision.  Respiratory: Positive for cough, shortness of breath and wheezing. Negative for hemoptysis and sputum production.   Cardiovascular: Negative for chest pain and palpitations.  Gastrointestinal: Positive for nausea, vomiting and abdominal pain. Negative for heartburn, diarrhea, constipation, blood in stool and melena.  Musculoskeletal: Positive for falls.  Skin: Negative for rash.  Neurological: Positive for dizziness, loss of consciousness and weakness. Negative for headaches.   Physical Exam: Blood pressure 148/53, pulse 74, temperature 99.6 F (37.6 C), temperature source Oral, resp. rate 12, SpO2 100 %. General: resting in bed on oxygen, quite cachectic with an apparently distended abdomen HEENT: no scleral icterus Cardiac: RRR, no rubs, murmurs or gallops Pulm: barrel-chested, using accessory muscles to breathe, prolonged expiratory phase, diffuse wheezes, no crackles Abd: distended, hypertympanic to percussion, diffuse slight tenderness without peritoneal signs Ext: cachectic with thin, xerotic skin  Lab results: Basic Metabolic Panel:  Recent Labs  11/04/14 1106 11/04/14 1114  NA 140 138  K 3.2* 3.1*  CL 104 100*  CO2 26  --   GLUCOSE 140* 138*  BUN 8 9  CREATININE 0.41* 0.40*  CALCIUM 7.0*  --    Liver Function Tests:  Recent Labs  11/04/14 1107  AST 18  ALT 17  ALKPHOS 79  BILITOT 0.2*  PROT 4.9*  ALBUMIN 2.4*    Recent Labs  11/04/14 1107  LIPASE 15*   CBC:  Recent Labs  11/03/14 1104 11/04/14 1106 11/04/14 1114  WBC 7.5 10.9*  --   HGB  --  6.1* 7.1*  HCT 25.8* 19.7* 21.0*  MCV  --  99.0  --   PLT  --  556*  --    Imaging results:  Ct Head Wo Contrast  11/04/2014   CLINICAL DATA:  Patient with syncopal episode  EXAM: CT HEAD WITHOUT CONTRAST  TECHNIQUE: Contiguous axial images were obtained from the base of the skull through the vertex without intravenous  contrast.  COMPARISON:  Brain CT 11/04/2014  FINDINGS: Ventricles and sulci are appropriate for patient's age. No evidence for acute cortically based infarct, intracranial hemorrhage, mass lesion or mass-effect. Orbits are unremarkable. Paranasal sinuses are well aerated. Mastoid air cells are unremarkable. Calvarium is intact.  IMPRESSION: No acute intracranial process.   Electronically Signed   By: Lovey Newcomer M.D.   On: 11/04/2014 14:29   Ct Angio Abd/pel W/ And/or W/o  11/04/2014   CLINICAL DATA:  Syncope. Dropping hemoglobin. Abdominal pain. Concern for AAA or mesenteric ischemia.  EXAM: CTA ABDOMEN AND PELVIS wITHOUT AND WITH CONTRAST  TECHNIQUE: Multidetector CT imaging of the abdomen and pelvis was performed using the standard protocol during bolus administration of intravenous contrast. Multiplanar reconstructed images and MIPs were obtained and reviewed to evaluate the vascular anatomy.  CONTRAST:  129mL OMNIPAQUE IOHEXOL 350 MG/ML SOLN  COMPARISON:  08/05/2014  FINDINGS: Bilateral breast implants partially imaged. Lung bases are clear. Heart is normal size. Severe emphysematous changes noted in the lung bases. No effusions.  Aorta and iliac vessels are normal caliber with scattered atherosclerotic calcifications. No dissection. Mesenteric vessels are widely patent. Calcified plaque at the origin of the renal arteries bilaterally without visible significant stenosis.  Liver, gallbladder, spleen, pancreas, adrenals and kidneys are unremarkable. Uterus, adnexae a and urinary bladder unremarkable.  Moderate stool within the colon.  Nonobstructive bowel gas pattern.  There is abnormal wall thickening in the region of the distal stomach and proximal duodenum, low likely gastritis and duodenitis. Cannot exclude peptic ulcer disease. Small amount of free fluid in the pelvis. No free air or adenopathy.  No acute bony abnormality. Degenerative facet disease and grade 1 anterolisthesis at L4-5.  Review of the MIP  images confirms the above findings.  IMPRESSION: Wall thickening in the distal stomach and proximal duodenum suggesting gastritis/ duodenitis.  No evidence of aortic aneurysm or dissection. No findings to suggest bowel ischemia.  Moderate stool burden throughout the colon.  Severe emphysematous changes noted in the lung bases.   Electronically Signed   By: Rolm Baptise M.D.   On: 11/04/2014 14:28    Other results: EKG: normal sinus rhythm, normal axis, no ST changes.  Assessment & Plan by Problem: [name] is a friendly 64 year-old lady with severe COPD and a gastrointestinal bleed of unknown origin as of yet, coming in with syncope likely secondary to her anemia as well as a distended, painful abdomen. We may have fortuitously identified the culprit of her bleed as the CT angiogram showed gastritis and duodenitis. I think her distended abdomen is an ileus related to her bowel inflammation; she's certainly not obstructed at this time so we can keep her opiate doses low, slowly advance her diet, and cool down the inflammation with a PPI. We'll have to keep a close eye on her abdomen regardless. I'll send for an H. Pylori screen, but in the meantime we will give her some protonix and transfuse with a goal of 7. I also considered pernicious anemia given her borderline macrocytic anemia, slowed mental status, weakness, and yellowish hue of her skin, but her B12 was normal in August 2016. She's received 1 unit PRBCs thus far which she tolerated well and her hemoglobin went from 6.1 to 7.1. It's also been two weeks since she's gotten ferraheme so  this is another option moving forward.  Gastrointestinal bleed: Per above, may be secondary to gastritis/duodenitis, perhaps H. Pylori, less likely pernicious anemia.  -Continue Protonix -Transfuse to goal of 8 -Follow-up H pylori antigen  COPD: Stable. -Continue home therapy  Bipolar disorder: Stable. -Continue lamotrigine  Dispo: Disposition is  deferred at this time, awaiting improvement of current medical problems. Anticipated discharge in approximately 1-2 day(s).   The patient does have a current PCP (Rushil Sherrye Payor, MD) and does need an [facility] hospital follow-up appointment after discharge.  The patient does have transportation limitations that hinder transportation to clinic appointments.  Signed: Loleta Chance, MD 11/04/2014, 5:32 PM

## 2014-11-04 NOTE — ED Provider Notes (Addendum)
CSN: [id number]     Arrival date & time 11/04/14  1029 History   First MD Initiated Contact with Patient 11/04/14 1030     Chief Complaint  Patient presents with  . Loss of Consciousness  . Seizures     (Consider location/radiation/quality/duration/timing/severity/associated sxs/prior Treatment) Patient is a 64 y.o. female presenting with abdominal pain. The history is provided by the patient.  Abdominal Pain Pain location:  Periumbilical Pain quality: sharp and shooting   Pain radiates to:  Does not radiate Pain severity:  Severe Onset quality:  Sudden Duration:  2 hours Timing:  Constant Progression:  Unchanged Chronicity:  New Relieved by:  Nothing Worsened by:  Nothing tried Ineffective treatments:  None tried Associated symptoms: no chest pain, no chills, no dysuria, no fever, no nausea, no shortness of breath and no vomiting   Risk factors: being elderly    64 yo F with a chief complaint of a syncope versus seizure event. Patient was smoking cigarettes that she felt terrible which meant that she was having significant abdominal pain and nausea walked into the lobby of her nursing home and collapsed on the ground. Patient was sent to have some shaking while she was on the ground. Denies any loss of bowel or bladder. Was neatly back to her mental baseline. Still having some abdominal pain states sharp periumbilical denies radiation. Some nausea associated with this. Per EMS patient was initially hypotensive 80/50.  Past Medical History  Diagnosis Date  . Depression   . Suicidal ideation 2007     attempted overdose 2012  Dr. Tivis Ringer report  . Bipolar disorder   . Substance abuse      narcotics, alcohol, tobacco  . Chronic pain syndrome     follows at [facility]  . DEFICIENCY, VITAMIN D NOS 01/03/2007  . Chronic lower back pain   . Anxiety   . OSTEOPOROSIS 06/17/2009    DEXA 05/2009 : L femur -2.9; R femur -2.5. Alendronate on med list but not taking. Needs  addressed ASAP as h/o fractures.    . Elevated liver function tests   . On home oxygen therapy     "3L; 24/7" (03/20/2014)  . Shortness of breath     "all the time" (02/27/2013)  . 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 Sciatic pain   . Anemia   . Pneumonia X 1?  . Hyperthyroidism     "borderline"  . History of blood transfusion     "getting my 1st later tonight" (03/20/2014)  . Arthritis     "legs, back; hips, primarily left hip" (03/20/2014)   Past Surgical History  Procedure Laterality Date  . Orif ankle fracture Left 10/2010  . Cesarean section  1974; 1979  . Tubal ligation  1979  . Augmentation mammaplasty  ~ 2007  . Fracture surgery    . Ankle debridement Left 10/2010   Family History  Problem Relation Age of Onset  . Stroke Neg Hx   . Cancer Neg Hx   . Myelodysplastic syndrome Father     Died from myelofibrosis though diagnosed post-mortem   Social History  Substance Use Topics  . Smoking status: Current Every Day Smoker -- 0.20 packs/day for 45 years    Types: Cigarettes  . Smokeless tobacco: Never Used     Comment: 8/22 "smoking 1 cigarettes/day; that's down from 2 ppd"  . Alcohol Use: 0.0 oz/week    0 Standard drinks or  equivalent per week     Comment: 03/20/2014 "nothing in 7 years; never had a problem w/it"   OB History    No data available     Review of Systems  Constitutional: Negative for fever and chills.  HENT: Negative for congestion and rhinorrhea.   Eyes: Negative for redness and visual disturbance.  Respiratory: Negative for shortness of breath and wheezing.   Cardiovascular: Negative for chest pain and palpitations.  Gastrointestinal: Positive for abdominal pain. Negative for nausea and vomiting.  Genitourinary: Negative for dysuria and urgency.  Musculoskeletal: Negative for myalgias and arthralgias.  Skin: Negative for pallor and wound.  Neurological: Positive for seizures (CT versus seizure  patient had some shaking activity noted by bystanders.) and syncope. Negative for dizziness and headaches.      Allergies  Banana; Lyrica; and Pollen extract  Home Medications   Prior to Admission medications   Medication Sig Start Date End Date Taking? Authorizing Provider  alendronate (FOSAMAX) 70 MG tablet Take 1 tablet (70 mg total) by mouth once a week. Take with a full glass of water on an empty stomach. Patient taking differently: Take 70 mg by mouth every Sunday. Take with a full glass of water on an empty stomach. 10/16/14  Yes Rushil Sherrye Payor, MD  budesonide (PULMICORT) 0.5 MG/2ML nebulizer solution Take 2 mLs (0.5 mg total) by nebulization 2 (two) times daily. DX j43.8 Patient taking differently: Take 0.5 mg by nebulization every 4 (four) hours. DX j43.8 07/03/14  Yes Kathee Delton, MD  calcium carbonate (TUMS - DOSED IN MG ELEMENTAL CALCIUM) 500 MG chewable tablet CHEW ONE TABLET THREE TIMES DAILY WITH MEALS 09/23/14  Yes Sid Falcon, MD  ferrous sulfate 325 (65 FE) MG tablet Take 1 tablet (325 mg total) by mouth daily. Take once a day or every other day, as tolerated. 09/17/14 09/17/15 Yes Zada Finders, MD  fluticasone (FLONASE) 50 MCG/ACT nasal spray Place 1 spray into both nostrils daily. 06/13/14 06/13/15 Yes Alex Ronnie Derby, DO  Fluticasone-Salmeterol (ADVAIR DISKUS) 250-50 MCG/DOSE AEPB INHALE 1 PUFF INTO THE LUNGS 2 TIMES DAILY 03/19/14  Yes Jones Bales, MD  gabapentin (NEURONTIN) 300 MG capsule Take 300 mg by mouth 3 (three) times daily.  06/25/14  Yes Historical Provider, MD  ipratropium-albuterol (DUONEB) 0.5-2.5 (3) MG/3ML SOLN 4 times a day (breakfast, lunch, dinner, bedtime).Can take 2 additional treatments if needed on bad days. DX J43.8 07/03/14  Yes Kathee Delton, MD  lamoTRIgine (LAMICTAL) 100 MG tablet TAKE ONE TABLET EVERY DAY 09/23/14  Yes Sid Falcon, MD  loratadine (CLARITIN) 10 MG tablet Take 10 mg by mouth daily.   Yes Historical Provider, MD  ondansetron (ZOFRAN  ODT) 4 MG disintegrating tablet Take 1 tablet (4 mg total) by mouth every 8 (eight) hours as needed for nausea or vomiting. 07/17/14  Yes Jerrye Noble, MD  Oxycodone HCl 10 MG TABS Take 10 mg by mouth every 12 (twelve) hours.  09/16/14  Yes Historical Provider, MD  polyethylene glycol powder (GLYCOLAX/MIRALAX) powder TAKE 17G DISSOLVED IN 8OZ WATER DAILY ASNEEDED FOR MODERATE CONSTIPATION 09/23/14  Yes Sid Falcon, MD  PROAIR HFA 108 (90 BASE) MCG/ACT inhaler INHALE ONE PUFF BY MOUTH EVERY 2 TO 4 HOURS AS NEEDED FOR WHEEZING 05/21/14  Yes Rushil Sherrye Payor, MD  Vitamin D, Ergocalciferol, (DRISDOL) 50000 UNITS CAPS capsule Take 1 capsule (50,000 Units total) by mouth every 7 (seven) days. Patient taking differently: Take 50,000 Units by mouth every Sunday.  09/10/14  Yes Rushil Sherrye Payor, MD  zolpidem (AMBIEN) 5 MG tablet Take 5 mg by mouth at bedtime. 09/09/14  Yes Historical Provider, MD   BP 149/58 mmHg  Pulse 73  Temp(Src) 99.1 F (37.3 C) (Oral)  Resp 11  SpO2 100% Physical Exam  Constitutional: She is oriented to person, place, and time. She appears well-developed and well-nourished. No distress.  HENT:  Head: Normocephalic and atraumatic.  Eyes: EOM are normal. Pupils are equal, round, and reactive to light.  Neck: Normal range of motion. Neck supple.  Cardiovascular: Normal rate and regular rhythm.  Exam reveals no gallop and no friction rub.   No murmur heard. Pulses:      Dorsalis pedis pulses are 1+ on the right side, and 1+ on the left side.       Posterior tibial pulses are 1+ on the right side, and 1+ on the left side.  Pulmonary/Chest: Effort normal. She has no wheezes. She has no rales.  Abdominal: Soft. She exhibits no distension. There is tenderness (Periumbilical). There is no rebound and no guarding.  Musculoskeletal: She exhibits no edema or tenderness.  Neurological: She is alert and oriented to person, place, and time.  Skin: Skin is warm and dry. She is not diaphoretic.   Psychiatric: She has a normal mood and affect. Her behavior is normal.    ED Course  Procedures (including critical care time) Labs Review Labs Reviewed  BASIC METABOLIC PANEL - Abnormal; Notable for the following:    Potassium 3.2 (*)    Glucose, Bld 140 (*)    Creatinine, Ser 0.41 (*)    Calcium 7.0 (*)    All other components within normal limits  CBC - Abnormal; Notable for the following:    WBC 10.9 (*)    RBC 1.99 (*)    Hemoglobin 6.1 (*)    HCT 19.7 (*)    RDW 21.2 (*)    Platelets 556 (*)    All other components within normal limits  HEPATIC FUNCTION PANEL - Abnormal; Notable for the following:    Total Protein 4.9 (*)    Albumin 2.4 (*)    Total Bilirubin 0.2 (*)    Bilirubin, Direct <0.1 (*)    All other components within normal limits  LIPASE, BLOOD - Abnormal; Notable for the following:    Lipase 15 (*)    All other components within normal limits  I-STAT CG4 LACTIC ACID, ED - Abnormal; Notable for the following:    Lactic Acid, Venous 3.71 (*)    All other components within normal limits  I-STAT CHEM 8, ED - Abnormal; Notable for the following:    Potassium 3.1 (*)    Chloride 100 (*)    Creatinine, Ser 0.40 (*)    Glucose, Bld 138 (*)    Calcium, Ion 0.88 (*)    Hemoglobin 7.1 (*)    HCT 21.0 (*)    All other components within normal limits  POC OCCULT BLOOD, ED - Abnormal; Notable for the following:    Fecal Occult Bld POSITIVE (*)    All other components within normal limits  H.PYLORI ANTIGEN, STOOL  CBC WITH DIFFERENTIAL/PLATELET  CBG MONITORING, ED  I-STAT CG4 LACTIC ACID, ED  I-STAT TROPOININ, ED  TYPE AND SCREEN  PREPARE RBC (CROSSMATCH)    Imaging Review Ct Head Wo Contrast  11/04/2014   CLINICAL DATA:  Patient with syncopal episode  EXAM: CT HEAD WITHOUT CONTRAST  TECHNIQUE: Contiguous axial images were obtained  from the base of the skull through the vertex without intravenous contrast.  COMPARISON:  Brain CT 11/04/2014  FINDINGS:  Ventricles and sulci are appropriate for patient's age. No evidence for acute cortically based infarct, intracranial hemorrhage, mass lesion or mass-effect. Orbits are unremarkable. Paranasal sinuses are well aerated. Mastoid air cells are unremarkable. Calvarium is intact.  IMPRESSION: No acute intracranial process.   Electronically Signed   By: Lovey Newcomer M.D.   On: 11/04/2014 14:29   Ct Angio Abd/pel W/ And/or W/o  11/04/2014   CLINICAL DATA:  Syncope. Dropping hemoglobin. Abdominal pain. Concern for AAA or mesenteric ischemia.  EXAM: CTA ABDOMEN AND PELVIS wITHOUT AND WITH CONTRAST  TECHNIQUE: Multidetector CT imaging of the abdomen and pelvis was performed using the standard protocol during bolus administration of intravenous contrast. Multiplanar reconstructed images and MIPs were obtained and reviewed to evaluate the vascular anatomy.  CONTRAST:  168mL OMNIPAQUE IOHEXOL 350 MG/ML SOLN  COMPARISON:  08/05/2014  FINDINGS: Bilateral breast implants partially imaged. Lung bases are clear. Heart is normal size. Severe emphysematous changes noted in the lung bases. No effusions.  Aorta and iliac vessels are normal caliber with scattered atherosclerotic calcifications. No dissection. Mesenteric vessels are widely patent. Calcified plaque at the origin of the renal arteries bilaterally without visible significant stenosis.  Liver, gallbladder, spleen, pancreas, adrenals and kidneys are unremarkable. Uterus, adnexae a and urinary bladder unremarkable.  Moderate stool within the colon.  Nonobstructive bowel gas pattern.  There is abnormal wall thickening in the region of the distal stomach and proximal duodenum, low likely gastritis and duodenitis. Cannot exclude peptic ulcer disease. Small amount of free fluid in the pelvis. No free air or adenopathy.  No acute bony abnormality. Degenerative facet disease and grade 1 anterolisthesis at L4-5.  Review of the MIP images confirms the above findings.  IMPRESSION: Wall  thickening in the distal stomach and proximal duodenum suggesting gastritis/ duodenitis.  No evidence of aortic aneurysm or dissection. No findings to suggest bowel ischemia.  Moderate stool burden throughout the colon.  Severe emphysematous changes noted in the lung bases.   Electronically Signed   By: Rolm Baptise M.D.   On: 11/04/2014 14:28   I have personally reviewed and evaluated these images and lab results as part of my medical decision-making.   EKG Interpretation   Date/Time:  Tuesday November 04 2014 10:34:57 EDT Ventricular Rate:  71 PR Interval:  119 QRS Duration: 70 QT Interval:  451 QTC Calculation: 490 R Axis:   88 Text Interpretation:  Sinus rhythm Borderline short PR interval Borderline  right axis deviation Nonspecific T abnormalities, lateral leads Borderline  prolonged QT interval No significant change since last tracing Confirmed  by Lovell Roe MD, Quillian Quince ([id number]) on 11/04/2014 3:08:40 PM      MDM   Final diagnoses:  None    64 yo F with a chief complaint of syncope and abdominal pain. Concern for possible mesenteric ischemia versus AAA. Patient pale diaphoretic. The pressure in the ED soft 350 systolic. Will give IV fluids obtain a CTA of the abdomen and pelvis.  CTA without concerns for mesenteric ischemia or AAA however patient with duodenitis. Rectal exam without gross blood.  Patient feeling better after a bolus IV fluids. Initial lactate was 3.7. Hemoglobin is 6.1 transfuse 1 unit in the ED. Will admit.  CRITICAL CARE Performed by: Cecilio Asper   Total critical care time: 35  Critical care time was exclusive of separately billable procedures and treating other patients.  Critical care was necessary to treat or prevent imminent or life-threatening deterioration.  Critical care was time spent personally by me on the following activities: development of treatment plan with patient and/or surrogate as well as nursing, discussions with consultants,  evaluation of patient's response to treatment, examination of patient, obtaining history from patient or surrogate, ordering and performing treatments and interventions, ordering and review of laboratory studies, ordering and review of radiographic studies, pulse oximetry and re-evaluation of patient's condition.   The patients results and plan were reviewed and discussed.   Any x-rays performed were independently reviewed by myself.   Differential diagnosis were considered with the presenting HPI.  Medications  iohexol (OMNIPAQUE) 300 MG/ML solution 25 mL (not administered)  pantoprazole (PROTONIX) injection 40 mg (not administered)  sodium chloride 0.9 % bolus 1,000 mL (0 mLs Intravenous Stopped 11/04/14 1259)  pantoprazole (PROTONIX) 80 mg in sodium chloride 0.9 % 100 mL IVPB (0 mg Intravenous Stopped 11/04/14 1259)  0.9 %  sodium chloride infusion ( Intravenous New Bag/Given 11/04/14 1223)  iohexol (OMNIPAQUE) 350 MG/ML injection 100 mL (100 mLs Intravenous Contrast Given 11/04/14 1416)  butalbital-acetaminophen-caffeine (FIORICET, ESGIC) 50-325-40 MG per tablet 1 tablet (1 tablet Oral Given 11/04/14 1558)    Filed Vitals:   11/04/14 1745 11/04/14 1800 11/04/14 1814 11/04/14 1815  BP: 138/47 135/62 143/64 149/58  Pulse: 83 72 74 73  Temp:   99.1 F (37.3 C)   TempSrc:   Oral   Resp: 19 15 11 11   SpO2: 100% 99%  100%    Final diagnoses:  None    Admission/ observation were discussed with the admitting physician, patient and/or family and they are comfortable with the plan.    Deno Etienne, DO 11/04/14 [place], DO 11/04/14 1607

## 2014-11-04 NOTE — ED Notes (Signed)
Pt. Presents to ED with complaint of syncopal episode associated with seizure like activity. Reports no hx of seizures. Pt. Was outside smoking when she started to feel bad. Pt. Went inside and friend witnessed pt to have rigid shaking extremities, pt. Lowered to floor. Pt. Does have skin tear to R elbow and L hand. Pt. Found to be pale and diaphoretic by EMS with BP of 80/40. Pt. AxO x4. Pt. Does not appear post ictal. Pt. States she has chronic pain, no new pain today. Endorses N/V/D x 3 days. Pt.

## 2014-11-05 DIAGNOSIS — D500 Iron deficiency anemia secondary to blood loss (chronic): Secondary | ICD-10-CM

## 2014-11-05 DIAGNOSIS — J439 Emphysema, unspecified: Secondary | ICD-10-CM | POA: Diagnosis not present

## 2014-11-05 DIAGNOSIS — K922 Gastrointestinal hemorrhage, unspecified: Secondary | ICD-10-CM | POA: Diagnosis not present

## 2014-11-05 DIAGNOSIS — R195 Other fecal abnormalities: Secondary | ICD-10-CM

## 2014-11-05 DIAGNOSIS — K253 Acute gastric ulcer without hemorrhage or perforation: Secondary | ICD-10-CM | POA: Diagnosis not present

## 2014-11-05 DIAGNOSIS — D62 Acute posthemorrhagic anemia: Secondary | ICD-10-CM | POA: Diagnosis not present

## 2014-11-05 DIAGNOSIS — K269 Duodenal ulcer, unspecified as acute or chronic, without hemorrhage or perforation: Secondary | ICD-10-CM | POA: Diagnosis not present

## 2014-11-05 DIAGNOSIS — R935 Abnormal findings on diagnostic imaging of other abdominal regions, including retroperitoneum: Secondary | ICD-10-CM | POA: Diagnosis not present

## 2014-11-05 DIAGNOSIS — K264 Chronic or unspecified duodenal ulcer with hemorrhage: Secondary | ICD-10-CM | POA: Diagnosis not present

## 2014-11-05 LAB — CBC WITH DIFFERENTIAL/PLATELET
BASOS PCT: 0 % (ref 0–1)
Band Neutrophils: 4 % (ref 0–10)
Basophils Absolute: 0 10*3/uL (ref 0.0–0.1)
Blasts: 0 %
EOS PCT: 2 % (ref 0–5)
Eosinophils Absolute: 0.1 10*3/uL (ref 0.0–0.7)
HEMATOCRIT: 22.9 % — AB (ref 36.0–46.0)
Hemoglobin: 7.4 g/dL — ABNORMAL LOW (ref 12.0–15.0)
Lymphocytes Relative: 29 % (ref 12–46)
Lymphs Abs: 1.9 10*3/uL (ref 0.7–4.0)
MCH: 29.5 pg (ref 26.0–34.0)
MCHC: 32.3 g/dL (ref 30.0–36.0)
MCV: 91.2 fL (ref 78.0–100.0)
MONO ABS: 0.1 10*3/uL (ref 0.1–1.0)
MYELOCYTES: 2 %
Metamyelocytes Relative: 1 %
Monocytes Relative: 2 % — ABNORMAL LOW (ref 3–12)
NEUTROS PCT: 60 % (ref 43–77)
NRBC: 0 /100{WBCs}
Neutro Abs: 4.6 10*3/uL (ref 1.7–7.7)
OTHER: 0 %
Platelets: 438 10*3/uL — ABNORMAL HIGH (ref 150–400)
Promyelocytes Absolute: 0 %
RBC: 2.51 MIL/uL — ABNORMAL LOW (ref 3.87–5.11)
RDW: 22.6 % — AB (ref 11.5–15.5)
WBC: 6.7 10*3/uL (ref 4.0–10.5)

## 2014-11-05 LAB — COMPREHENSIVE METABOLIC PANEL
ALBUMIN: 2.3 g/dL — AB (ref 3.5–5.0)
ALT: 17 U/L (ref 14–54)
AST: 16 U/L (ref 15–41)
Alkaline Phosphatase: 65 U/L (ref 38–126)
Anion gap: 8 (ref 5–15)
BUN: 9 mg/dL (ref 6–20)
CHLORIDE: 101 mmol/L (ref 101–111)
CO2: 31 mmol/L (ref 22–32)
Calcium: 7.1 mg/dL — ABNORMAL LOW (ref 8.9–10.3)
Creatinine, Ser: 0.39 mg/dL — ABNORMAL LOW (ref 0.44–1.00)
GFR calc Af Amer: >60 mL/min (ref >60)
GLUCOSE: 78 mg/dL (ref 65–99)
POTASSIUM: 3.1 mmol/L — AB (ref 3.5–5.1)
SODIUM: 140 mmol/L (ref 135–145)
Total Bilirubin: 0.3 mg/dL (ref 0.3–1.2)
Total Protein: 4.7 g/dL — ABNORMAL LOW (ref 6.5–8.1)

## 2014-11-05 LAB — PREPARE RBC (CROSSMATCH)

## 2014-11-05 LAB — CBC
HEMATOCRIT: 19.4 % — AB (ref 36.0–46.0)
Hemoglobin: 6.4 g/dL — CL (ref 12.0–15.0)
MCH: 31.4 pg (ref 26.0–34.0)
MCHC: 33 g/dL (ref 30.0–36.0)
MCV: 95.1 fL (ref 78.0–100.0)
Platelets: 456 10*3/uL — ABNORMAL HIGH (ref 150–400)
RBC: 2.04 MIL/uL — ABNORMAL LOW (ref 3.87–5.11)
RDW: 18.8 % — AB (ref 11.5–15.5)
WBC: 7.4 10*3/uL (ref 4.0–10.5)

## 2014-11-05 MED ORDER — ACETAMINOPHEN 500 MG PO TABS
500.0000 mg | ORAL_TABLET | ORAL | Status: DC | PRN
  Administered 2014-11-05 – 2014-11-09 (×9): 500 mg via ORAL
  Filled 2014-11-05 (×11): qty 1

## 2014-11-05 MED ORDER — POTASSIUM CHLORIDE CRYS ER 20 MEQ PO TBCR
40.0000 meq | EXTENDED_RELEASE_TABLET | Freq: Once | ORAL | Status: AC
  Administered 2014-11-05: 40 meq via ORAL
  Filled 2014-11-05: qty 2

## 2014-11-05 MED ORDER — SODIUM CHLORIDE 0.9 % IV SOLN
Freq: Once | INTRAVENOUS | Status: AC
  Administered 2014-11-05: 04:00:00 via INTRAVENOUS

## 2014-11-05 NOTE — Progress Notes (Signed)
PT Cancellation Note  Patient Details Name: [name] MRN: [id number] DOB: 1950/05/06   Cancelled Treatment:    Reason Eval/Treat Not Completed: PT screened, no needs identified, will sign off. Pt reports she is mobilizing ok and is at baseline.   Adreyan Carbajal 11/05/2014, 12:47 PM  [facility] PT [phone]

## 2014-11-05 NOTE — Progress Notes (Signed)
Subjective: Patient still feels a little weak, but it is improved after getting 2 units of PRBC She was still having some epigastric pain, she reported having some BM which was not bloody, and which was brown. This morning. She was interested in having a diet, so we did a clear liquid diet incase EGD is done.   Plan to call GI, recheck CBC, and then do a med rec.   Objective: Vital signs in last 24 hours: Filed Vitals:   11/05/14 0421 11/05/14 0454 11/05/14 0500 11/05/14 0749  BP: 132/68 138/60  157/81  Pulse: 86 77  98  Temp: 98.6 F (37 C) 98.4 F (36.9 C)  99 F (37.2 C)  TempSrc: Oral Oral  Oral  Resp: 18 18  20   Height:      Weight:   95 lb 14.4 oz (43.5 kg)   SpO2: 99% 100%  99%     Vitals are reviewed General: looks a little pale, lying in bed, on 4 L [place] at 99% sp02 CV: RRR, no m/r/g Resp: diffuse end expiratory wheezing heard bilaterally Abd: mild epigastric tenderness to palpation, +BS in all 4 Extremities: no clubbing cyanosis or edema  Lab Results: Labs, imaging, micro and cultures are reviewed, and the pertinent ones are discussed either in Subjective or in the Assessment and Plan  Micro Results:  Studies/Results: Ct Head Wo Contrast  11/04/2014   CLINICAL DATA:  Patient with syncopal episode  EXAM: CT HEAD WITHOUT CONTRAST  TECHNIQUE: Contiguous axial images were obtained from the base of the skull through the vertex without intravenous contrast.  COMPARISON:  Brain CT 11/04/2014  FINDINGS: Ventricles and sulci are appropriate for patient's age. No evidence for acute cortically based infarct, intracranial hemorrhage, mass lesion or mass-effect. Orbits are unremarkable. Paranasal sinuses are well aerated. Mastoid air cells are unremarkable. Calvarium is intact.  IMPRESSION: No acute intracranial process.   Electronically Signed   By: Lovey Newcomer M.D.   On: 11/04/2014 14:29   Ct Angio Abd/pel W/ And/or W/o  11/04/2014   CLINICAL DATA:  Syncope. Dropping  hemoglobin. Abdominal pain. Concern for AAA or mesenteric ischemia.  EXAM: CTA ABDOMEN AND PELVIS wITHOUT AND WITH CONTRAST  TECHNIQUE: Multidetector CT imaging of the abdomen and pelvis was performed using the standard protocol during bolus administration of intravenous contrast. Multiplanar reconstructed images and MIPs were obtained and reviewed to evaluate the vascular anatomy.  CONTRAST:  164mL OMNIPAQUE IOHEXOL 350 MG/ML SOLN  COMPARISON:  08/05/2014  FINDINGS: Bilateral breast implants partially imaged. Lung bases are clear. Heart is normal size. Severe emphysematous changes noted in the lung bases. No effusions.  Aorta and iliac vessels are normal caliber with scattered atherosclerotic calcifications. No dissection. Mesenteric vessels are widely patent. Calcified plaque at the origin of the renal arteries bilaterally without visible significant stenosis.  Liver, gallbladder, spleen, pancreas, adrenals and kidneys are unremarkable. Uterus, adnexae a and urinary bladder unremarkable.  Moderate stool within the colon.  Nonobstructive bowel gas pattern.  There is abnormal wall thickening in the region of the distal stomach and proximal duodenum, low likely gastritis and duodenitis. Cannot exclude peptic ulcer disease. Small amount of free fluid in the pelvis. No free air or adenopathy.  No acute bony abnormality. Degenerative facet disease and grade 1 anterolisthesis at L4-5.  Review of the MIP images confirms the above findings.  IMPRESSION: Wall thickening in the distal stomach and proximal duodenum suggesting gastritis/ duodenitis.  No evidence of aortic aneurysm or dissection. No findings  to suggest bowel ischemia.  Moderate stool burden throughout the colon.  Severe emphysematous changes noted in the lung bases.   Electronically Signed   By: Rolm Baptise M.D.   On: 11/04/2014 14:28   Medications: I have reviewed the patient's current medications. Scheduled Meds: . budesonide  0.5 mg Nebulization BID  .  fluticasone  1 spray Each Nare Daily  . gabapentin  300 mg Oral TID  . ipratropium-albuterol  3 mL Nebulization QID  . lamoTRIgine  100 mg Oral Daily  . mometasone-formoterol  2 puff Inhalation BID  . pantoprazole (PROTONIX) IV  40 mg Intravenous Q12H  . sodium chloride  3 mL Intravenous Q12H   Continuous Infusions:  PRN Meds:.acetaminophen, albuterol, sodium chloride Assessment/Plan:  64 yo woman with h/o IDA requiring multiple PRBC and ferraheme tranfusions in the past, COPD with 4L O2 at home, smoking history, and anxiety, admitted for dizziness and found to have a HgB of 6.1, and given 2 units PRBCs  Dizziness due to low Hemoglobin of 6.1: most likely secondary to occult bleeding as FOBT + and also due to longstanding IDA with some component of anemia of chronic disease, CT angio shows duodenitis and gastritis  -Given 2 units of PRBCs -Recheck CBC -Will consult GI for possible EGD and colonoscopy: while she has some medical comorbidities that would give her a slightly higher risk of anesthesia and procedural complications, the benefits of her having a egd and or colonoscopy clearly outweigh the risks, and they both are both diagnostic and therapeutic. Virtual and capsule endoscopy are less sensitive than egd. -On Protonix 40 mg BID IV -May give a dose of ferraheme, patient was scheduled to have weekly tranfusions of ferraheme since she was last discharged 2 weeks back but never received it.   -HPylori pending -may want to check MMA   Thrombocytosis: most likely reactive thrombocytosis, -Will monitor  COPD: On 4 L [place] On albuterol, pulmicort, and duonebs   #FEN:  -Diet: clear liquids  #DVT prophylaxis: scds  #CODE STATUS: full   Dispo: Disposition is deferred at this time, awaiting improvement of current medical problems.  Anticipated discharge in approximately 2 day(s).   The patient does have a current PCP (Rushil Sherrye Payor, MD) and does need an [facility] hospital follow-up  appointment after discharge.  The patient does not have transportation limitations that hinder transportation to clinic appointments.  .Services Needed at time of discharge: Y = Yes, Blank = No PT:   OT:   RN:   Equipment:   Other:       Burgess Estelle, MD 11/05/2014, 11:44 AM

## 2014-11-05 NOTE — Progress Notes (Signed)
Patient's stool was black, large, and formed at 0715.

## 2014-11-05 NOTE — Care Management Note (Addendum)
Case Management Note  Patient Details  Name: [name] MRN: [id number] Date of Birth: 06/08/1950  Subjective/Objective:    NCM spoke with Patient, she is from [facility], she has home oxygen with [employer],  She will follow up with Dr. Posey Pronto.  She has insurance and can afford her medications.  Patient states someone from [facility] would probably be able to bring her oxygen tank at dc but will check. NCM will cont to follow for dc needs.  Per pt eval , patient at baseline and no needs identified.                Action/Plan:   Expected Discharge Date:                  Expected Discharge Plan:  [place]  In-House Referral:     Discharge planning Services  CM Consult  Post Acute Care Choice:    Choice offered to:     DME Arranged:    DME Agency:     HH Arranged:    [place] Agency:     Status of Service:  In process, will continue to follow  Medicare Important Message Given:    Date Medicare IM Given:    Medicare IM give by:    Date Additional Medicare IM Given:    Additional Medicare Important Message give by:     If discussed at [place] of Stay Meetings, dates discussed:    Additional Comments:  Zenon Mayo, RN 11/05/2014, 12:39 PM

## 2014-11-05 NOTE — Progress Notes (Signed)
Internal Medicine Clinic Attending  I saw and evaluated the patient.  I personally confirmed the key portions of the history and exam documented by Dr. Boswell and I reviewed pertinent patient test results.  The assessment, diagnosis, and plan were formulated together and I agree with the documentation in the resident's note. 

## 2014-11-05 NOTE — Progress Notes (Signed)
Labs drawn early this morning based on previous Hgb, this mornings Hgb 6.4, Dr. Volanda Napoleon notified and aware, 1 unit PRBC ordered and transfusing at this time, patient asymptomatic sleeping comfortably will continue to monitor.

## 2014-11-05 NOTE — H&P (Addendum)
Internal Medicine Attending Admission Note  I saw and evaluated the patient. I reviewed the resident's note and I agree with the resident's findings and plan as documented in the resident's note.  Assessment & Plan by Problem:  Active Problems:   Bipolar I disorder, most recent episode depressed   COPD (chronic obstructive pulmonary disease) with emphysema   Generalized abdominal pain   Acute blood loss anemia   Symptomatic Anemia due to Upper GI Bleed: Patient admitted with new acute anemia, hemoglobin acutely dropped from 7.8-6.1 within 1 day of admission. Previous he found to be iron deficiency. Patient is postmenopausal denies hematuria, so I think this is mostly coming from the GI bleeding source. Long history of using NSAIDs, also endorses epigastric discomforts and dyspepsia making a bleeding peptic ulcer the most likely diagnosis. No history of portal hypertension to suggest varices. Patient is stable currently, continuing Protonix IV twice a day. Plan to consult gastroenterology for EGD later today or tomorrow. Given the patient's chronic hypoxic respiratory failure due to advanced COPD, she may need extra anesthesia support for the procedure.   Syncope: Almost certainly this was caused by symptomatic anemia. The patient's is well known to our clinic as being very high risk for polypharmacy. She has chronic pain as well as bipolar disorder for which she uses multiple centrally acting medications. I like to minimize these medications much as possible, so please hold nighttime zolpidem, reduce oxycodone to 5 mg as needed twice a day, and hold all her other anticholinergic medications.   Chief Complaint(s): syncope  History - key components related to admission:  64 year old woman with a history of iron deficiency anemia presented to the emergency department with a sensation of presyncope. She was hospitalized about 2 weeks ago with similar complaints and found to have significant  symptomatic anemia. Received a dose of IV iron infusion with some improvement. Was seen in the clinic 2 days ago and was doing relatively well. Hemoglobin check at that time was stable at 7.8. Yesterday she was outside smoking a cigarette when she began to feel short of breath, then passed out and fell to the floor. She awoke spontaneously shortly there after. Currently she feels mildly improved though still weak throughout. No pain in her abdomen. She says that she feels very hungry. Denies any other recent illness, no fevers or chills. Shortness of breath is stable, no increased cough or sputum production. Denies any changes in the color of her stool, says that they're still brown. Reports some increased frequency of stooling.  Lab results: Reviewed in Epic  Physical Exam - key components related to admission:  Filed Vitals:   11/05/14 0421 11/05/14 0454 11/05/14 0500 11/05/14 0749  BP: 132/68 138/60  157/81  Pulse: 86 77  98  Temp: 98.6 F (37 C) 98.4 F (36.9 C)  99 F (37.2 C)  TempSrc: Oral Oral  Oral  Resp: 18 18  20   Height:      Weight:   95 lb 14.4 oz (43.5 kg)   SpO2: 99% 100%  99%    Gen: Chronically ill-appearing woman, awake and alert spontaneously, conversational ENT: MMM CV: Regular rate and rhythm, no murmurs Lungs: Unlabored, clear to auscultation throughout Abd: Soft, nontender, nondistended Ext: Warm and well-perfused, very thin, no edema Neuro: Alert and oriented, normal cranial nerves, strength is normal in the upper and lower extremities

## 2014-11-05 NOTE — Consult Note (Signed)
Consultation  Referring Provider:  Internal Medicine Teaching Service    Primary Care Physician:  Charlott Rakes, MD Primary Gastroenterologist:  Lucio Edward, MD Reason for Consultation:  Anemia             HPI:   Jordan LAGUE is a 64 y.o. female who was seen by Dr. Sharlett Iles in our office in 2014 for nausea. We saw patient a second time in Jan 2016 (inpatient) for evaluation of anemia. Her hgb had fallen from 10 to 5 Patient was heme negative and not interested in endoscopic workup. Additionally patient felt to be high risk for procedures given severe COPD. She was transfused and hgb rose to 9.8.   Patient presented to ED yesterday with abdominal pain. She was found to have a hgb of 7.8, it was 11.3 late June. CTA of abdomen suggests wall thickening in stomach and proximal duodenum.    Patient  Past Medical History  Diagnosis Date  . Depression   . Suicidal ideation 2007     attempted overdose 2012  Dr. Tivis Ringer report  . Bipolar disorder   . Substance abuse      narcotics, alcohol, tobacco  . Chronic pain syndrome     follows at [facility]  . DEFICIENCY, VITAMIN D NOS 01/03/2007  . Chronic lower back pain   . Anxiety   . OSTEOPOROSIS 06/17/2009    DEXA 05/2009 : L femur -2.9; R femur -2.5. Alendronate on med list but not taking. Needs addressed ASAP as h/o fractures.    . Elevated liver function tests   . On home oxygen therapy     "3L; 24/7" (03/20/2014)  . Shortness of breath     "all the time" (02/27/2013)  . 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 Sciatic pain   . Anemia   . Pneumonia X 1?  . Hyperthyroidism     "borderline"  . History of blood transfusion     "getting my 1st later tonight" (03/20/2014)  . Arthritis     "legs, back; hips, primarily left hip" (03/20/2014)    Past Surgical History  Procedure Laterality Date  . Orif ankle fracture Left 10/2010  . Cesarean section  1974; 1979  . Tubal  ligation  1979  . Augmentation mammaplasty  ~ 2007  . Fracture surgery    . Ankle debridement Left 10/2010    Family History  Problem Relation Age of Onset  . Stroke Neg Hx   . Cancer Neg Hx   . Myelodysplastic syndrome Father     Died from myelofibrosis though diagnosed post-mortem     Social History  Substance Use Topics  . Smoking status: Current Every Day Smoker -- 0.20 packs/day for 45 years    Types: Cigarettes  . Smokeless tobacco: Never Used     Comment: 8/22 "smoking 1 cigarettes/day; that's down from 2 ppd"  . Alcohol Use: 0.0 oz/week    0 Standard drinks or equivalent per week     Comment: 03/20/2014 "nothing in 7 years; never had a problem w/it"    Prior to Admission medications   Medication Sig Start Date End Date Taking? Authorizing Provider  alendronate (FOSAMAX) 70 MG tablet Take 1 tablet (70 mg total) by mouth once a week. Take with a full glass of water on an empty stomach. Patient taking differently: Take 70 mg by mouth every Sunday. Take with a full glass of water  on an empty stomach. 10/16/14  Yes Rushil Sherrye Payor, MD  budesonide (PULMICORT) 0.5 MG/2ML nebulizer solution Take 2 mLs (0.5 mg total) by nebulization 2 (two) times daily. DX j43.8 Patient taking differently: Take 0.5 mg by nebulization every 4 (four) hours. DX j43.8 07/03/14  Yes Kathee Delton, MD  calcium carbonate (TUMS - DOSED IN MG ELEMENTAL CALCIUM) 500 MG chewable tablet CHEW ONE TABLET THREE TIMES DAILY WITH MEALS 09/23/14  Yes Sid Falcon, MD  ferrous sulfate 325 (65 FE) MG tablet Take 1 tablet (325 mg total) by mouth daily. Take once a day or every other day, as tolerated. 09/17/14 09/17/15 Yes Zada Finders, MD  fluticasone (FLONASE) 50 MCG/ACT nasal spray Place 1 spray into both nostrils daily. 06/13/14 06/13/15 Yes Alex Ronnie Derby, DO  Fluticasone-Salmeterol (ADVAIR DISKUS) 250-50 MCG/DOSE AEPB INHALE 1 PUFF INTO THE LUNGS 2 TIMES DAILY 03/19/14  Yes Jones Bales, MD  gabapentin (NEURONTIN) 300 MG  capsule Take 300 mg by mouth 3 (three) times daily.  06/25/14  Yes Historical Provider, MD  ipratropium-albuterol (DUONEB) 0.5-2.5 (3) MG/3ML SOLN 4 times a day (breakfast, lunch, dinner, bedtime).Can take 2 additional treatments if needed on bad days. DX J43.8 07/03/14  Yes Kathee Delton, MD  lamoTRIgine (LAMICTAL) 100 MG tablet TAKE ONE TABLET EVERY DAY 09/23/14  Yes Sid Falcon, MD  loratadine (CLARITIN) 10 MG tablet Take 10 mg by mouth daily.   Yes Historical Provider, MD  ondansetron (ZOFRAN ODT) 4 MG disintegrating tablet Take 1 tablet (4 mg total) by mouth every 8 (eight) hours as needed for nausea or vomiting. 07/17/14  Yes Jerrye Noble, MD  Oxycodone HCl 10 MG TABS Take 10 mg by mouth every 12 (twelve) hours.  09/16/14  Yes Historical Provider, MD  polyethylene glycol powder (GLYCOLAX/MIRALAX) powder TAKE 17G DISSOLVED IN 8OZ WATER DAILY ASNEEDED FOR MODERATE CONSTIPATION 09/23/14  Yes Sid Falcon, MD  PROAIR HFA 108 (90 BASE) MCG/ACT inhaler INHALE ONE PUFF BY MOUTH EVERY 2 TO 4 HOURS AS NEEDED FOR WHEEZING 05/21/14  Yes Rushil Sherrye Payor, MD  Vitamin D, Ergocalciferol, (DRISDOL) 50000 UNITS CAPS capsule Take 1 capsule (50,000 Units total) by mouth every 7 (seven) days. Patient taking differently: Take 50,000 Units by mouth every Sunday.  09/10/14  Yes Rushil Sherrye Payor, MD  zolpidem (AMBIEN) 5 MG tablet Take 5 mg by mouth at bedtime. 09/09/14  Yes Historical Provider, MD    Current Facility-Administered Medications  Medication Dose Route Frequency Provider Last Rate Last Dose  . acetaminophen (TYLENOL) tablet 500 mg  500 mg Oral Q4H PRN Burgess Estelle, MD   500 mg at 11/05/14 1151  . albuterol (PROVENTIL) (2.5 MG/3ML) 0.083% nebulizer solution 2.5 mg  2.5 mg Nebulization Q4H PRN Francesca Oman, DO      . budesonide (PULMICORT) nebulizer solution 0.5 mg  0.5 mg Nebulization BID Axel Filler, MD   0.5 mg at 11/05/14 0954  . fluticasone (FLONASE) 50 MCG/ACT nasal spray 1 spray  1 spray Each  Nare Daily Francesca Oman, DO   1 spray at 11/05/14 1138  . gabapentin (NEURONTIN) capsule 300 mg  300 mg Oral TID Francesca Oman, DO   300 mg at 11/05/14 1118  . ipratropium-albuterol (DUONEB) 0.5-2.5 (3) MG/3ML nebulizer solution 3 mL  3 mL Nebulization QID Axel Filler, MD   3 mL at 11/05/14 1346  . lamoTRIgine (LAMICTAL) tablet 100 mg  100 mg Oral Daily Francesca Oman, DO  100 mg at 11/05/14 1136  . mometasone-formoterol (DULERA) 100-5 MCG/ACT inhaler 2 puff  2 puff Inhalation BID Francesca Oman, DO   2 puff at 11/05/14 1346  . pantoprazole (PROTONIX) injection 40 mg  40 mg Intravenous Q12H Francesca Oman, DO   40 mg at 11/05/14 1122  . sodium chloride 0.9 % injection 3 mL  3 mL Intravenous Q12H Francesca Oman, DO   3 mL at 11/05/14 1000  . sodium chloride 0.9 % injection 3 mL  3 mL Intravenous PRN Francesca Oman, DO   3 mL at 11/05/14 1128    Allergies as of 11/04/2014 - Review Complete 11/04/2014  Allergen Reaction Noted  . Banana Shortness Of Breath and Nausea And Vomiting 01/10/2012  . Lyrica [pregabalin] Other (See Comments) 06/26/2014  . Pollen extract Other (See Comments) 11/20/2012     Review of Systems:    Unobtainable. Patient extremely sleepy, will not stay awake to answer questions   Physical Exam:  Vital signs in last 24 hours: Temp:  [98.4 F (36.9 C)-99.6 F (37.6 C)] 98.6 F (37 C) (08/24 1407) Pulse Rate:  [71-98] 73 (08/24 1407) Resp:  [10-20] 18 (08/24 1407) BP: (114-162)/(34-81) 129/56 mmHg (08/24 1407) SpO2:  [98 %-100 %] 99 % (08/24 0749) Weight:  [95 lb 12.8 oz (43.455 kg)-95 lb 14.4 oz (43.5 kg)] 95 lb 14.4 oz (43.5 kg) (08/24 0500) Last BM Date: 11/05/14 General:   Frail appearing white female in NAD Head:  Normocephalic and atraumatic. Ears:  Normal auditory acuity. Neck:  Supple Lungs:  Respirations even and unlabored. Will not stay awake for respiratory exam   Heart:  Regular rate and rhythm; no MRG Abdomen:  Soft, nondistended, nontender.  Normal bowel sounds. No appreciable masses or hepatomegaly.  Rectal:  Not performed.  Msk:  Symmetrical without gross deformities.  Extremities:  Without edema. Neurologic:  Excessive sleepiness. Partially opens eyes, answers some questions with yes or no.  Skin:  Intact without significant lesions or rashes.   LAB RESULTS:  Recent Labs  11/04/14 1106 11/04/14 1114 11/04/14 1914 11/05/14 0345  WBC 10.9*  --  8.0 7.4  HGB 6.1* 7.1* 6.1* 6.4*  HCT 19.7* 21.0* 19.1* 19.4*  PLT 556*  --  482* 456*   BMET  Recent Labs  11/04/14 1106 11/04/14 1114 11/05/14 0345  NA 140 138 140  K 3.2* 3.1* 3.1*  CL 104 100* 101  CO2 26  --  31  GLUCOSE 140* 138* 78  BUN 8 9 9   CREATININE 0.41* 0.40* 0.39*  CALCIUM 7.0*  --  7.1*   LFT  Recent Labs  11/04/14 1107 11/05/14 0345  PROT 4.9* 4.7*  ALBUMIN 2.4* 2.3*  AST 18 16  ALT 17 17  ALKPHOS 79 65  BILITOT 0.2* 0.3  BILIDIR <0.1*  --   IBILI NOT CALCULATED  --      STUDIES:    Ct Angio Abd/pel W/ And/or W/o  11/04/2014   CLINICAL DATA:  Syncope. Dropping hemoglobin. Abdominal pain. Concern for AAA or mesenteric ischemia.  EXAM: CTA ABDOMEN AND PELVIS wITHOUT AND WITH CONTRAST  TECHNIQUE: Multidetector CT imaging of the abdomen and pelvis was performed using the standard protocol during bolus administration of intravenous contrast. Multiplanar reconstructed images and MIPs were obtained and reviewed to evaluate the vascular anatomy.  CONTRAST:  117mL OMNIPAQUE IOHEXOL 350 MG/ML SOLN  COMPARISON:  08/05/2014  FINDINGS: Bilateral breast implants partially imaged. Lung bases are clear. Heart is normal size. Severe  emphysematous changes noted in the lung bases. No effusions.  Aorta and iliac vessels are normal caliber with scattered atherosclerotic calcifications. No dissection. Mesenteric vessels are widely patent. Calcified plaque at the origin of the renal arteries bilaterally without visible significant stenosis.  Liver, gallbladder,  spleen, pancreas, adrenals and kidneys are unremarkable. Uterus, adnexae a and urinary bladder unremarkable.  Moderate stool within the colon.  Nonobstructive bowel gas pattern.  There is abnormal wall thickening in the region of the distal stomach and proximal duodenum, low likely gastritis and duodenitis. Cannot exclude peptic ulcer disease. Small amount of free fluid in the pelvis. No free air or adenopathy.  No acute bony abnormality. Degenerative facet disease and grade 1 anterolisthesis at L4-5.  Review of the MIP images confirms the above findings.  IMPRESSION: Wall thickening in the distal stomach and proximal duodenum suggesting gastritis/ duodenitis.  No evidence of aortic aneurysm or dissection. No findings to suggest bowel ischemia.  Moderate stool burden throughout the colon.  Severe emphysematous changes noted in the lung bases.   Electronically Signed   By: Rolm Baptise M.D.   On: 11/04/2014 14:28    PREVIOUS ENDOSCOPIES:            none found   Impression / Plan:   29. 64 year old female admitted with abdominal pain, heme positive stools and recurrent iron deficiency anemia. She declined inpatient workup for anemia in January. She has severe COPD and was felt to be at increased risk for procedures. Patient was heme negative at that time.   I am unable to obtain any details from patient. She is excessively sleepy. Spoke with RN, patient hasn't had any pain meds / sedatives but was up most of the night getting blood and going through admission process. Her vitals are stable (just asked the nurse to recheck them). I will round on patient in am. If pulmonary condition permits and patient agreeable, will plan for EGD with MAC this admission for evaluation of anemia and CTscan findings. I don't see where she has had a colonoscopy but can't really ask her at present.   2. Severe COPD, on home 02  3. Bipolar disorder, depression, hx of substance abuse.   Thanks    Tye Savoy  11/05/2014,  2:27 PM  GI ATTENDING (note file 1 day later secondary to EPIC malfunction yesterday)  History, laboratories, x-rays reviewed. Patient seen and examined. Agree with consultation note as outlined above. Patient with recurrent anemia. Abnormal CT scan which may suggest peptic ulcer disease. The patient is high risk for endoscopic procedures due to her advanced lung disease. Not sure that she has a colonoscopy candidate, but possibly reasonable for upper endoscopy with MAC. Need to discuss this with her when she is more awake.  Docia Chuck. Geri Seminole., M.D. [facility] Division of Gastroenterology

## 2014-11-06 ENCOUNTER — Telehealth: Payer: Self-pay | Admitting: Internal Medicine

## 2014-11-06 DIAGNOSIS — R935 Abnormal findings on diagnostic imaging of other abdominal regions, including retroperitoneum: Secondary | ICD-10-CM

## 2014-11-06 DIAGNOSIS — J439 Emphysema, unspecified: Secondary | ICD-10-CM | POA: Diagnosis not present

## 2014-11-06 DIAGNOSIS — K264 Chronic or unspecified duodenal ulcer with hemorrhage: Secondary | ICD-10-CM | POA: Diagnosis not present

## 2014-11-06 DIAGNOSIS — D62 Acute posthemorrhagic anemia: Secondary | ICD-10-CM

## 2014-11-06 DIAGNOSIS — K922 Gastrointestinal hemorrhage, unspecified: Secondary | ICD-10-CM | POA: Diagnosis not present

## 2014-11-06 DIAGNOSIS — K269 Duodenal ulcer, unspecified as acute or chronic, without hemorrhage or perforation: Secondary | ICD-10-CM | POA: Diagnosis not present

## 2014-11-06 DIAGNOSIS — D500 Iron deficiency anemia secondary to blood loss (chronic): Secondary | ICD-10-CM | POA: Diagnosis not present

## 2014-11-06 LAB — CBC
HEMATOCRIT: 22.2 % — AB (ref 36.0–46.0)
HEMATOCRIT: 24.2 % — AB (ref 36.0–46.0)
HEMOGLOBIN: 7.9 g/dL — AB (ref 12.0–15.0)
Hemoglobin: 7.2 g/dL — ABNORMAL LOW (ref 12.0–15.0)
MCH: 29.8 pg (ref 26.0–34.0)
MCH: 30.7 pg (ref 26.0–34.0)
MCHC: 32.4 g/dL (ref 30.0–36.0)
MCHC: 32.6 g/dL (ref 30.0–36.0)
MCV: 91.7 fL (ref 78.0–100.0)
MCV: 94.2 fL (ref 78.0–100.0)
PLATELETS: 413 10*3/uL — AB (ref 150–400)
Platelets: 454 10*3/uL — ABNORMAL HIGH (ref 150–400)
RBC: 2.42 MIL/uL — AB (ref 3.87–5.11)
RBC: 2.57 MIL/uL — AB (ref 3.87–5.11)
RDW: 23.2 % — AB (ref 11.5–15.5)
RDW: 23.5 % — ABNORMAL HIGH (ref 11.5–15.5)
WBC: 5 10*3/uL (ref 4.0–10.5)
WBC: 6.1 10*3/uL (ref 4.0–10.5)

## 2014-11-06 LAB — TYPE AND SCREEN
ABO/RH(D): O POS
ANTIBODY SCREEN: NEGATIVE
Unit division: 0
Unit division: 0

## 2014-11-06 LAB — BASIC METABOLIC PANEL
ANION GAP: 8 (ref 5–15)
BUN: <5 mg/dL — ABNORMAL LOW (ref 6–20)
CALCIUM: 7.5 mg/dL — AB (ref 8.9–10.3)
CO2: 33 mmol/L — ABNORMAL HIGH (ref 22–32)
Chloride: 99 mmol/L — ABNORMAL LOW (ref 101–111)
Creatinine, Ser: <0.3 mg/dL — ABNORMAL LOW (ref 0.44–1.00)
Glucose, Bld: 82 mg/dL (ref 65–99)
POTASSIUM: 3.4 mmol/L — AB (ref 3.5–5.1)
Sodium: 140 mmol/L (ref 135–145)

## 2014-11-06 LAB — H.PYLORI ANTIGEN, STOOL: H. pylori ag, stool: NEGATIVE

## 2014-11-06 MED ORDER — POTASSIUM CHLORIDE CRYS ER 20 MEQ PO TBCR
20.0000 meq | EXTENDED_RELEASE_TABLET | Freq: Once | ORAL | Status: AC
  Administered 2014-11-06: 20 meq via ORAL
  Filled 2014-11-06: qty 1

## 2014-11-06 MED ORDER — OXYCODONE HCL 5 MG PO TABS
5.0000 mg | ORAL_TABLET | Freq: Three times a day (TID) | ORAL | Status: DC | PRN
  Administered 2014-11-06: 5 mg via ORAL
  Administered 2014-11-06: 10 mg via ORAL
  Administered 2014-11-06 (×2): 5 mg via ORAL
  Administered 2014-11-07 – 2014-11-09 (×6): 10 mg via ORAL
  Filled 2014-11-06 (×5): qty 2
  Filled 2014-11-06: qty 1
  Filled 2014-11-06: qty 2
  Filled 2014-11-06 (×2): qty 1
  Filled 2014-11-06: qty 2

## 2014-11-06 NOTE — Telephone Encounter (Signed)
Call to patient to confirm appointment for 11/07/14 at 9:45 lmtcb

## 2014-11-06 NOTE — Progress Notes (Signed)
Internal Medicine Attending:   I saw and examined the patient. I reviewed the resident's note and I agree with the resident's findings and plan as documented in the resident's note.  64 year old woman admitted with probable upper GI bleeding and symptomatic iron deficiency anemia. Currently feeling well today. Vitals are stable, abdomen is soft and mildly tender in the epigastrium. One melanic stool yesterday. Hemoglobin continues to trend down. Plan for EGD with anesthesia support tomorrow. Continuing Protonix IV twice a day, check afternoon hemoglobin and transfuse if less than 7.0.

## 2014-11-06 NOTE — Progress Notes (Signed)
   Subjective:  No acute events overnight Patient continued to have pain  And had 1 melanotic stool yesterday. She has been NPO and wanting to eat.  Per GI, plan to do EGD with MAC tomorrow.   Objective: Vitals are reviewed General: looks a little pale, lying in bed, on 4 L [place] at 99% sp02 CV: RRR, no m/r/g Resp: diffuse end expiratory wheezing heard bilaterally Abd: mild epigastric tenderness to palpation, +BS in all 4  Labs, imaging, micro and cultures are reviewed, and the pertinent ones are discussed either in Subjective or in the Assessment and Plan  A/P 64 yo woman with h/o IDA requiring multiple PRBC and ferraheme tranfusions in the past, COPD with 4L O2 at home, smoking history, and anxiety, admitted for dizziness and found to have a HgB of 6.1, and given 2 units PRBCs  Symptomatic Anemia due to Peptic Ulcer Disease: HgB acutely dropped 1 g on admission, and Dizziness and fatigue most likely due to PUD and ongoing occult bleeding . She is FOBT + and also has longstanding IDA with some component of anemia of chronic disease, CT angio shows duodenitis and gastritis. Patient has extensive history of NSAID use "back and body" making PUD likely.  -Currently stable -Her HgB dropped to 7.2 from 7.4 yesterday evening -Will recheck HgB, will transfuse if HgB less than 7. She has already been Given 2 units of PRBCs -GI plans for EGD under anesthesia tomorrow -HPylori negative -NPO after midnight, currently on clears -On Protonix 40 mg BID IV   Iron Deficiency: -May give a dose of ferraheme, before she is discharged home  Syncope: most likely due to polypharmacy, and symptomatic anemia -Will hold all ambien, and reduce oxycodone to 5 mg bid, hold all anticholinergics.    Thrombocytosis: most likely reactive thrombocytosis, -Platelets trending down.  COPD: On 4 L [place] On albuterol, pulmicort, and duonebs    Burgess Estelle, MD 11/06/2014, 1:52 PM

## 2014-11-06 NOTE — Progress Notes (Signed)
    Progress Note   Subjective  much more alert today.  Feels okay. Hungry   Objective   Vital signs in last 24 hours: Temp:  [98.6 F (37 C)-98.9 F (37.2 C)] 98.6 F (37 C) (08/25 3845) Pulse Rate:  [63-75] 63 (08/25 0613) Resp:  [18] 18 (08/25 0613) BP: (121-131)/(49-58) 121/58 mmHg (08/25 0613) SpO2:  [98 %-99 %] 98 % (08/25 0759) Weight:  [97 lb 7.1 oz (44.2 kg)] 97 lb 7.1 oz (44.2 kg) (08/25 3646) Last BM Date: 11/05/14 General:    Frail, white female in NAD Heart:  Regular rate and rhythm Abdomen:  Soft, nondistended, moderate diffuse upper abdominal pain. Normal bowel sounds. Extremities:  Without edema. Neurologic:  Alert and oriented,  grossly normal neurologically. Psych:  Cooperative. Normal mood and affect.    Lab Results:  Recent Labs  11/05/14 0345 11/05/14 1454 11/06/14 0605  WBC 7.4 6.7 5.0  HGB 6.4* 7.4* 7.2*  HCT 19.4* 22.9* 22.2*  PLT 456* 438* 413*   BMET  Recent Labs  11/04/14 1106 11/04/14 1114 11/05/14 0345 11/06/14 0605  NA 140 138 140 140  K 3.2* 3.1* 3.1* 3.4*  CL 104 100* 101 99*  CO2 26  --  31 33*  GLUCOSE 140* 138* 78 82  BUN 8 9 9  <5*  CREATININE 0.41* 0.40* 0.39* <0.30*  CALCIUM 7.0*  --  7.1* 7.5*   LFT  Recent Labs  11/04/14 1107 11/05/14 0345  PROT 4.9* 4.7*  ALBUMIN 2.4* 2.3*  AST 18 16  ALT 17 17  ALKPHOS 79 65  BILITOT 0.2* 0.3  BILIDIR <0.1*  --   IBILI NOT CALCULATED  --         27. 64 year old female with recurrent iron deficiency anemia. Heme positive stool.  CTA of abdomen done for abdominal pain reveals gastric and proximal duodenal wall thickening. Patient too sleepy to talk yesterday but we spoke today and she hasn't had any overt bleeding at home. No nausea or weight loss. She denied NSAID use but I spoke to admitting team and patient was on large amounts of NSAIDs at home. Rule out gastritis, PUD. For further evaluation patient will be scheduled for EGD with MAC tomorrow. She is agreeable to the  procedure but wants to eat today. Will make NPO after midnight.   Ideally patient would also have a colonoscopy for evaluation of iron deficiency anemia but she is quite frail and has severe COPD increasing risk for endoscopic procedures. Patient may be best served by a virtual colonoscopy   2. Severe COPD. Pulmonary status at baseline per admitting team.    Tye Savoy  11/06/2014, 9:50 AM   GI ATTENDING  Interval history data reviewed. Patient seen and examined. Agree with progress note as outlined above. Possibly NSAID induced upper GI injury to explain anemia and Hemoccult-positive stool. Plan EGD with MAC to evaluate. If colonic evaluation felt to be helpful, would consider virtual colonoscopy.  Docia Chuck. Geri Seminole., M.D. [facility] Division of Gastroenterology

## 2014-11-06 NOTE — Progress Notes (Signed)
Pt reports seeing "shadows" in peripheral vision. This previously occurred a few months ago for several days and went away on its own (when she was in the hospital).

## 2014-11-07 ENCOUNTER — Encounter (HOSPITAL_COMMUNITY): Payer: Self-pay | Admitting: *Deleted

## 2014-11-07 ENCOUNTER — Ambulatory Visit: Payer: Self-pay | Admitting: Internal Medicine

## 2014-11-07 ENCOUNTER — Encounter (HOSPITAL_COMMUNITY)
Admission: EM | Disposition: A | Discharge status: Home / Self Care | Payer: Self-pay | Source: Home / Self Care | Attending: Emergency Medicine

## 2014-11-07 ENCOUNTER — Observation Stay (HOSPITAL_COMMUNITY): Payer: Medicare Other | Admitting: Anesthesiology

## 2014-11-07 DIAGNOSIS — K264 Chronic or unspecified duodenal ulcer with hemorrhage: Secondary | ICD-10-CM | POA: Diagnosis not present

## 2014-11-07 DIAGNOSIS — D500 Iron deficiency anemia secondary to blood loss (chronic): Secondary | ICD-10-CM | POA: Diagnosis not present

## 2014-11-07 DIAGNOSIS — K259 Gastric ulcer, unspecified as acute or chronic, without hemorrhage or perforation: Secondary | ICD-10-CM | POA: Diagnosis not present

## 2014-11-07 DIAGNOSIS — Z720 Tobacco use: Secondary | ICD-10-CM | POA: Diagnosis not present

## 2014-11-07 DIAGNOSIS — D509 Iron deficiency anemia, unspecified: Secondary | ICD-10-CM | POA: Diagnosis not present

## 2014-11-07 DIAGNOSIS — K269 Duodenal ulcer, unspecified as acute or chronic, without hemorrhage or perforation: Secondary | ICD-10-CM | POA: Diagnosis present

## 2014-11-07 DIAGNOSIS — K253 Acute gastric ulcer without hemorrhage or perforation: Secondary | ICD-10-CM | POA: Diagnosis not present

## 2014-11-07 DIAGNOSIS — R935 Abnormal findings on diagnostic imaging of other abdominal regions, including retroperitoneum: Secondary | ICD-10-CM | POA: Diagnosis not present

## 2014-11-07 DIAGNOSIS — K922 Gastrointestinal hemorrhage, unspecified: Secondary | ICD-10-CM | POA: Diagnosis not present

## 2014-11-07 DIAGNOSIS — J449 Chronic obstructive pulmonary disease, unspecified: Secondary | ICD-10-CM | POA: Diagnosis not present

## 2014-11-07 HISTORY — PX: ESOPHAGOGASTRODUODENOSCOPY: SHX5428

## 2014-11-07 LAB — BASIC METABOLIC PANEL
Anion gap: 9 (ref 5–15)
BUN: <5 mg/dL — ABNORMAL LOW (ref 6–20)
CHLORIDE: 100 mmol/L — AB (ref 101–111)
CO2: 31 mmol/L (ref 22–32)
Calcium: 7.6 mg/dL — ABNORMAL LOW (ref 8.9–10.3)
GLUCOSE: 89 mg/dL (ref 65–99)
Potassium: 4.1 mmol/L (ref 3.5–5.1)
Sodium: 140 mmol/L (ref 135–145)

## 2014-11-07 LAB — CBC
HEMATOCRIT: 23 % — AB (ref 36.0–46.0)
HEMOGLOBIN: 7.3 g/dL — AB (ref 12.0–15.0)
MCH: 30 pg (ref 26.0–34.0)
MCHC: 31.7 g/dL (ref 30.0–36.0)
MCV: 94.7 fL (ref 78.0–100.0)
Platelets: 440 10*3/uL — ABNORMAL HIGH (ref 150–400)
RBC: 2.43 MIL/uL — ABNORMAL LOW (ref 3.87–5.11)
RDW: 23.5 % — ABNORMAL HIGH (ref 11.5–15.5)
WBC: 4.6 10*3/uL (ref 4.0–10.5)

## 2014-11-07 SURGERY — EGD (ESOPHAGOGASTRODUODENOSCOPY)
Anesthesia: Monitor Anesthesia Care

## 2014-11-07 MED ORDER — LIDOCAINE HCL (CARDIAC) 20 MG/ML IV SOLN
INTRAVENOUS | Status: DC | PRN
  Administered 2014-11-07: 80 mg via INTRAVENOUS

## 2014-11-07 MED ORDER — PROPOFOL 10 MG/ML IV BOLUS
INTRAVENOUS | Status: DC | PRN
  Administered 2014-11-07: 40 mg via INTRAVENOUS
  Administered 2014-11-07: 80 mg via INTRAVENOUS
  Administered 2014-11-07: 40 mg via INTRAVENOUS

## 2014-11-07 MED ORDER — FERROUS SULFATE 325 (65 FE) MG PO TABS
325.0000 mg | ORAL_TABLET | Freq: Two times a day (BID) | ORAL | Status: DC
  Administered 2014-11-07 – 2014-11-09 (×4): 325 mg via ORAL
  Filled 2014-11-07 (×4): qty 1

## 2014-11-07 MED ORDER — LACTATED RINGERS IV SOLN
INTRAVENOUS | Status: DC | PRN
  Administered 2014-11-07: 13:00:00 via INTRAVENOUS

## 2014-11-07 MED ORDER — SODIUM CHLORIDE 0.9 % IV SOLN
510.0000 mg | Freq: Once | INTRAVENOUS | Status: AC
  Administered 2014-11-08: 510 mg via INTRAVENOUS
  Filled 2014-11-07: qty 17

## 2014-11-07 MED ORDER — LACTATED RINGERS IV SOLN
INTRAVENOUS | Status: DC
  Administered 2014-11-07: 13:00:00 via INTRAVENOUS

## 2014-11-07 MED ORDER — PANTOPRAZOLE SODIUM 40 MG PO TBEC
40.0000 mg | DELAYED_RELEASE_TABLET | Freq: Two times a day (BID) | ORAL | Status: DC
  Administered 2014-11-08 – 2014-11-09 (×3): 40 mg via ORAL
  Filled 2014-11-07 (×3): qty 1

## 2014-11-07 MED ORDER — PROMETHAZINE HCL 25 MG/ML IJ SOLN
12.5000 mg | Freq: Once | INTRAMUSCULAR | Status: AC
  Administered 2014-11-07: 25 mg via INTRAVENOUS
  Filled 2014-11-07: qty 1

## 2014-11-07 NOTE — H&P (View-Only) (Signed)
Consultation  Referring Provider:  Internal Medicine Teaching Service    Primary Care Physician:  Charlott Rakes, MD Primary Gastroenterologist:  Lucio Edward, MD Reason for Consultation:  Anemia             HPI:   [name] is a 64 y.o. female who was seen by Dr. Sharlett Iles in our office in 2014 for nausea. We saw patient a second time in Jan 2016 (inpatient) for evaluation of anemia. Her hgb had fallen from 10 to 5 Patient was heme negative and not interested in endoscopic workup. Additionally patient felt to be high risk for procedures given severe COPD. She was transfused and hgb rose to 9.8.   Patient presented to ED yesterday with abdominal pain. She was found to have a hgb of 7.8, it was 11.3 late June. CTA of abdomen suggests wall thickening in stomach and proximal duodenum.    Patient  Past Medical History  Diagnosis Date  . Depression   . Suicidal ideation 2007     attempted overdose 2012  Dr. Tivis Ringer report  . Bipolar disorder   . Substance abuse      narcotics, alcohol, tobacco  . Chronic pain syndrome     follows at [facility]  . DEFICIENCY, VITAMIN D NOS 01/03/2007  . Chronic lower back pain   . Anxiety   . OSTEOPOROSIS 06/17/2009    DEXA 05/2009 : L femur -2.9; R femur -2.5. Alendronate on med list but not taking. Needs addressed ASAP as h/o fractures.    . Elevated liver function tests   . On home oxygen therapy     "3L; 24/7" (03/20/2014)  . Shortness of breath     "all the time" (02/27/2013)  . 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 Sciatic pain   . Anemia   . Pneumonia X 1?  . Hyperthyroidism     "borderline"  . History of blood transfusion     "getting my 1st later tonight" (03/20/2014)  . Arthritis     "legs, back; hips, primarily left hip" (03/20/2014)    Past Surgical History  Procedure Laterality Date  . Orif ankle fracture Left 10/2010  . Cesarean section  1974; 1979  . Tubal  ligation  1979  . Augmentation mammaplasty  ~ 2007  . Fracture surgery    . Ankle debridement Left 10/2010    Family History  Problem Relation Age of Onset  . Stroke Neg Hx   . Cancer Neg Hx   . Myelodysplastic syndrome Father     Died from myelofibrosis though diagnosed post-mortem     Social History  Substance Use Topics  . Smoking status: Current Every Day Smoker -- 0.20 packs/day for 45 years    Types: Cigarettes  . Smokeless tobacco: Never Used     Comment: 8/22 "smoking 1 cigarettes/day; that's down from 2 ppd"  . Alcohol Use: 0.0 oz/week    0 Standard drinks or equivalent per week     Comment: 03/20/2014 "nothing in 7 years; never had a problem w/it"    Prior to Admission medications   Medication Sig Start Date End Date Taking? Authorizing Provider  alendronate (FOSAMAX) 70 MG tablet Take 1 tablet (70 mg total) by mouth once a week. Take with a full glass of water on an empty stomach. Patient taking differently: Take 70 mg by mouth every Sunday. Take with a full glass of water  on an empty stomach. 10/16/14  Yes Rushil Sherrye Payor, MD  budesonide (PULMICORT) 0.5 MG/2ML nebulizer solution Take 2 mLs (0.5 mg total) by nebulization 2 (two) times daily. DX j43.8 Patient taking differently: Take 0.5 mg by nebulization every 4 (four) hours. DX j43.8 07/03/14  Yes Kathee Delton, MD  calcium carbonate (TUMS - DOSED IN MG ELEMENTAL CALCIUM) 500 MG chewable tablet CHEW ONE TABLET THREE TIMES DAILY WITH MEALS 09/23/14  Yes Sid Falcon, MD  ferrous sulfate 325 (65 FE) MG tablet Take 1 tablet (325 mg total) by mouth daily. Take once a day or every other day, as tolerated. 09/17/14 09/17/15 Yes Zada Finders, MD  fluticasone (FLONASE) 50 MCG/ACT nasal spray Place 1 spray into both nostrils daily. 06/13/14 06/13/15 Yes Alex Ronnie Derby, DO  Fluticasone-Salmeterol (ADVAIR DISKUS) 250-50 MCG/DOSE AEPB INHALE 1 PUFF INTO THE LUNGS 2 TIMES DAILY 03/19/14  Yes Jones Bales, MD  gabapentin (NEURONTIN) 300 MG  capsule Take 300 mg by mouth 3 (three) times daily.  06/25/14  Yes Historical Provider, MD  ipratropium-albuterol (DUONEB) 0.5-2.5 (3) MG/3ML SOLN 4 times a day (breakfast, lunch, dinner, bedtime).Can take 2 additional treatments if needed on bad days. DX J43.8 07/03/14  Yes Kathee Delton, MD  lamoTRIgine (LAMICTAL) 100 MG tablet TAKE ONE TABLET EVERY DAY 09/23/14  Yes Sid Falcon, MD  loratadine (CLARITIN) 10 MG tablet Take 10 mg by mouth daily.   Yes Historical Provider, MD  ondansetron (ZOFRAN ODT) 4 MG disintegrating tablet Take 1 tablet (4 mg total) by mouth every 8 (eight) hours as needed for nausea or vomiting. 07/17/14  Yes Jerrye Noble, MD  Oxycodone HCl 10 MG TABS Take 10 mg by mouth every 12 (twelve) hours.  09/16/14  Yes Historical Provider, MD  polyethylene glycol powder (GLYCOLAX/MIRALAX) powder TAKE 17G DISSOLVED IN 8OZ WATER DAILY ASNEEDED FOR MODERATE CONSTIPATION 09/23/14  Yes Sid Falcon, MD  PROAIR HFA 108 (90 BASE) MCG/ACT inhaler INHALE ONE PUFF BY MOUTH EVERY 2 TO 4 HOURS AS NEEDED FOR WHEEZING 05/21/14  Yes Rushil Sherrye Payor, MD  Vitamin D, Ergocalciferol, (DRISDOL) 50000 UNITS CAPS capsule Take 1 capsule (50,000 Units total) by mouth every 7 (seven) days. Patient taking differently: Take 50,000 Units by mouth every Sunday.  09/10/14  Yes Rushil Sherrye Payor, MD  zolpidem (AMBIEN) 5 MG tablet Take 5 mg by mouth at bedtime. 09/09/14  Yes Historical Provider, MD    Current Facility-Administered Medications  Medication Dose Route Frequency Provider Last Rate Last Dose  . acetaminophen (TYLENOL) tablet 500 mg  500 mg Oral Q4H PRN Burgess Estelle, MD   500 mg at 11/05/14 1151  . albuterol (PROVENTIL) (2.5 MG/3ML) 0.083% nebulizer solution 2.5 mg  2.5 mg Nebulization Q4H PRN Francesca Oman, DO      . budesonide (PULMICORT) nebulizer solution 0.5 mg  0.5 mg Nebulization BID Axel Filler, MD   0.5 mg at 11/05/14 0954  . fluticasone (FLONASE) 50 MCG/ACT nasal spray 1 spray  1 spray Each  Nare Daily Francesca Oman, DO   1 spray at 11/05/14 1138  . gabapentin (NEURONTIN) capsule 300 mg  300 mg Oral TID Francesca Oman, DO   300 mg at 11/05/14 1118  . ipratropium-albuterol (DUONEB) 0.5-2.5 (3) MG/3ML nebulizer solution 3 mL  3 mL Nebulization QID Axel Filler, MD   3 mL at 11/05/14 1346  . lamoTRIgine (LAMICTAL) tablet 100 mg  100 mg Oral Daily Francesca Oman, DO  100 mg at 11/05/14 1136  . mometasone-formoterol (DULERA) 100-5 MCG/ACT inhaler 2 puff  2 puff Inhalation BID Francesca Oman, DO   2 puff at 11/05/14 1346  . pantoprazole (PROTONIX) injection 40 mg  40 mg Intravenous Q12H Francesca Oman, DO   40 mg at 11/05/14 1122  . sodium chloride 0.9 % injection 3 mL  3 mL Intravenous Q12H Francesca Oman, DO   3 mL at 11/05/14 1000  . sodium chloride 0.9 % injection 3 mL  3 mL Intravenous PRN Francesca Oman, DO   3 mL at 11/05/14 1128    Allergies as of 11/04/2014 - Review Complete 11/04/2014  Allergen Reaction Noted  . Banana Shortness Of Breath and Nausea And Vomiting 01/10/2012  . Lyrica [pregabalin] Other (See Comments) 06/26/2014  . Pollen extract Other (See Comments) 11/20/2012     Review of Systems:    Unobtainable. Patient extremely sleepy, will not stay awake to answer questions   Physical Exam:  Vital signs in last 24 hours: Temp:  [98.4 F (36.9 C)-99.6 F (37.6 C)] 98.6 F (37 C) (08/24 1407) Pulse Rate:  [71-98] 73 (08/24 1407) Resp:  [10-20] 18 (08/24 1407) BP: (114-162)/(34-81) 129/56 mmHg (08/24 1407) SpO2:  [98 %-100 %] 99 % (08/24 0749) Weight:  [95 lb 12.8 oz (43.455 kg)-95 lb 14.4 oz (43.5 kg)] 95 lb 14.4 oz (43.5 kg) (08/24 0500) Last BM Date: 11/05/14 General:   Frail appearing white female in NAD Head:  Normocephalic and atraumatic. Ears:  Normal auditory acuity. Neck:  Supple Lungs:  Respirations even and unlabored. Will not stay awake for respiratory exam   Heart:  Regular rate and rhythm; no MRG Abdomen:  Soft, nondistended, nontender.  Normal bowel sounds. No appreciable masses or hepatomegaly.  Rectal:  Not performed.  Msk:  Symmetrical without gross deformities.  Extremities:  Without edema. Neurologic:  Excessive sleepiness. Partially opens eyes, answers some questions with yes or no.  Skin:  Intact without significant lesions or rashes.   LAB RESULTS:  Recent Labs  11/04/14 1106 11/04/14 1114 11/04/14 1914 11/05/14 0345  WBC 10.9*  --  8.0 7.4  HGB 6.1* 7.1* 6.1* 6.4*  HCT 19.7* 21.0* 19.1* 19.4*  PLT 556*  --  482* 456*   BMET  Recent Labs  11/04/14 1106 11/04/14 1114 11/05/14 0345  NA 140 138 140  K 3.2* 3.1* 3.1*  CL 104 100* 101  CO2 26  --  31  GLUCOSE 140* 138* 78  BUN 8 9 9   CREATININE 0.41* 0.40* 0.39*  CALCIUM 7.0*  --  7.1*   LFT  Recent Labs  11/04/14 1107 11/05/14 0345  PROT 4.9* 4.7*  ALBUMIN 2.4* 2.3*  AST 18 16  ALT 17 17  ALKPHOS 79 65  BILITOT 0.2* 0.3  BILIDIR <0.1*  --   IBILI NOT CALCULATED  --      STUDIES:    Ct Angio Abd/pel W/ And/or W/o  11/04/2014   CLINICAL DATA:  Syncope. Dropping hemoglobin. Abdominal pain. Concern for AAA or mesenteric ischemia.  EXAM: CTA ABDOMEN AND PELVIS wITHOUT AND WITH CONTRAST  TECHNIQUE: Multidetector CT imaging of the abdomen and pelvis was performed using the standard protocol during bolus administration of intravenous contrast. Multiplanar reconstructed images and MIPs were obtained and reviewed to evaluate the vascular anatomy.  CONTRAST:  156mL OMNIPAQUE IOHEXOL 350 MG/ML SOLN  COMPARISON:  08/05/2014  FINDINGS: Bilateral breast implants partially imaged. Lung bases are clear. Heart is normal size. Severe  emphysematous changes noted in the lung bases. No effusions.  Aorta and iliac vessels are normal caliber with scattered atherosclerotic calcifications. No dissection. Mesenteric vessels are widely patent. Calcified plaque at the origin of the renal arteries bilaterally without visible significant stenosis.  Liver, gallbladder,  spleen, pancreas, adrenals and kidneys are unremarkable. Uterus, adnexae a and urinary bladder unremarkable.  Moderate stool within the colon.  Nonobstructive bowel gas pattern.  There is abnormal wall thickening in the region of the distal stomach and proximal duodenum, low likely gastritis and duodenitis. Cannot exclude peptic ulcer disease. Small amount of free fluid in the pelvis. No free air or adenopathy.  No acute bony abnormality. Degenerative facet disease and grade 1 anterolisthesis at L4-5.  Review of the MIP images confirms the above findings.  IMPRESSION: Wall thickening in the distal stomach and proximal duodenum suggesting gastritis/ duodenitis.  No evidence of aortic aneurysm or dissection. No findings to suggest bowel ischemia.  Moderate stool burden throughout the colon.  Severe emphysematous changes noted in the lung bases.   Electronically Signed   By: Rolm Baptise M.D.   On: 11/04/2014 14:28    PREVIOUS ENDOSCOPIES:            none found   Impression / Plan:   32. 64 year old female admitted with abdominal pain, heme positive stools and recurrent iron deficiency anemia. She declined inpatient workup for anemia in January. She has severe COPD and was felt to be at increased risk for procedures. Patient was heme negative at that time.   I am unable to obtain any details from patient. She is excessively sleepy. Spoke with RN, patient hasn't had any pain meds / sedatives but was up most of the night getting blood and going through admission process. Her vitals are stable (just asked the nurse to recheck them). I will round on patient in am. If pulmonary condition permits and patient agreeable, will plan for EGD with MAC this admission for evaluation of anemia and CTscan findings. I don't see where she has had a colonoscopy but can't really ask her at present.   2. Severe COPD, on home 02  3. Bipolar disorder, depression, hx of substance abuse.   Thanks    Tye Savoy  11/05/2014,  2:27 PM  GI ATTENDING (note file 1 day later secondary to EPIC malfunction yesterday)  History, laboratories, x-rays reviewed. Patient seen and examined. Agree with consultation note as outlined above. Patient with recurrent anemia. Abnormal CT scan which may suggest peptic ulcer disease. The patient is high risk for endoscopic procedures due to her advanced lung disease. Not sure that she has a colonoscopy candidate, but possibly reasonable for upper endoscopy with MAC. Need to discuss this with her when she is more awake.  Docia Chuck. Geri Seminole., M.D. [facility] Division of Gastroenterology

## 2014-11-07 NOTE — Anesthesia Preprocedure Evaluation (Signed)
Anesthesia Evaluation  Patient identified by MRN, date of birth, ID band Patient awake    Airway Mallampati: II  TM Distance: >3 FB Neck ROM: Full    Dental   Pulmonary shortness of breath, pneumonia -, COPDCurrent Smoker,  breath sounds clear to auscultation        Cardiovascular negative cardio ROS  Rhythm:Regular Rate:Normal     Neuro/Psych    GI/Hepatic Neg liver ROS, GERD-  ,GI history noted. CE   Endo/Other  Hyperthyroidism   Renal/GU negative Renal ROS     Musculoskeletal   Abdominal   Peds  Hematology   Anesthesia Other Findings   Reproductive/Obstetrics                             Anesthesia Physical Anesthesia Plan  ASA: III  Anesthesia Plan: MAC   Post-op Pain Management:    Induction: Intravenous  Airway Management Planned: Nasal Cannula  Additional Equipment:   Intra-op Plan:   Post-operative Plan:   Informed Consent: I have reviewed the patients History and Physical, chart, labs and discussed the procedure including the risks, benefits and alternatives for the proposed anesthesia with the patient or authorized representative who has indicated his/her understanding and acceptance.   Dental advisory given  Plan Discussed with: CRNA, Anesthesiologist and Surgeon  Anesthesia Plan Comments:         Anesthesia Quick Evaluation

## 2014-11-07 NOTE — Progress Notes (Signed)
Internal Medicine Attending:   I saw and examined the patient. I reviewed the resident's note and I agree with the resident's findings and plan as documented in the resident's note.  64 year old with probable upper GI bleed causing symptomatic anemia. No bowel movements yesterday. Vitals are stable, abdomen mildly tender throughout. Plan for EGD today, I anticipate peptic ulcer disease to be found. We will follow up based on interventions done.

## 2014-11-07 NOTE — Interval H&P Note (Signed)
History and Physical Interval Note:  11/07/2014 1:11 PM  [name]  has presented today for surgery, with the diagnosis of iron deficiency anemia  The various methods of treatment have been discussed with the patient and family. After consideration of risks, benefits and other options for treatment, the patient has consented to  Procedure(s): ESOPHAGOGASTRODUODENOSCOPY (EGD) (N/A) as a surgical intervention .  The patient's history has been reviewed, patient examined, no change in status, stable for surgery.  I have reviewed the patient's chart and labs.  Questions were answered to the patient's satisfaction.     Scarlette Shorts

## 2014-11-07 NOTE — Progress Notes (Signed)
   Subjective: Some constipation otherwise no complaints.  Ready for the EGD   Objective: Vital signs in last 24 hours: Filed Vitals:   11/07/14 1344 11/07/14 1345 11/07/14 1400 11/07/14 1438  BP: 104/48   149/59  Pulse: 72 71 72 65  Temp: 98 F (36.7 C)     TempSrc: Oral     Resp: 15 15 18 20   Height:      Weight:      SpO2: 99% 100% 97% 93%     Vitals are reviewed General: resting in chair NAD CV: RRR, no m/r/g Resp: CTAB XBL:[id number], +BS in all 4 Extremities: no clubbing cyanosis or edema  Lab Results: Labs, imaging, micro and cultures are reviewed, and the pertinent ones are discussed either in Subjective or in the Assessment and Plan  Micro Results:  Studies/Results: No results found. Medications: I have reviewed the patient's current medications. Scheduled Meds: . budesonide  0.5 mg Nebulization BID  . ferrous sulfate  325 mg Oral BID WC  . ferumoxytol  510 mg Intravenous Once  . fluticasone  1 spray Each Nare Daily  . gabapentin  300 mg Oral TID  . ipratropium-albuterol  3 mL Nebulization QID  . lamoTRIgine  100 mg Oral Daily  . mometasone-formoterol  2 puff Inhalation BID  . pantoprazole (PROTONIX) IV  40 mg Intravenous Q12H  . sodium chloride  3 mL Intravenous Q12H   Continuous Infusions:  PRN Meds:.acetaminophen, albuterol, oxyCODONE, sodium chloride Assessment/Plan:  64 yo woman with h/o IDA requiring multiple PRBC and ferraheme tranfusions in the past, COPD with 4L O2 at home, smoking history, and anxiety, admitted for dizziness and found to have a HgB of 6.1, and given 2 units PRBCs  Acute on chronic Iron deficiency anemia secondary to chronic GI bleed from multiple stomach ulcerations likely due to NASID use. : -EGD today revealed a gaint ulceration which was treated with clips as well as some other minor ulcerations.  Biopsies were sent for H Pylori testing although likely cause is NSAIDs.   - Will need indefinite BID PPI, will transition to  PO - AVOID NSAIDS - Follow up biopsy results - Repeat CBC in AM and will need PCP follow up ensuring improvement.  Will need oral iron supplementation however is very deficient and will go ahead and give her second dose of IV feraheme today.   Thrombocytosis: most likely reactive thrombocytosis, -Will monitor  COPD: On 4 L [place] On albuterol, pulmicort, and duonebs   #FEN:  -Diet: clear liquids, advance as tolerated  #DVT prophylaxis: scds  #CODE STATUS: full   Dispo: Likely d/c in AM if CBC stable  The patient does have a current PCP (Rushil Sherrye Payor, MD) and does need an [facility] hospital follow-up appointment after discharge.  The patient does not have transportation limitations that hinder transportation to clinic appointments.  .Services Needed at time of discharge: Y = Yes, Blank = No PT:   OT:   RN:   Equipment:   Other:       Lucious Groves, DO 11/07/2014, 6:12 PM

## 2014-11-07 NOTE — Transfer of Care (Signed)
Immediate Anesthesia Transfer of Care Note  Patient: [name]  Procedure(s) Performed: Procedure(s): ESOPHAGOGASTRODUODENOSCOPY (EGD) (N/A)  Patient Location: Endoscopy Unit  Anesthesia Type:MAC  Level of Consciousness: awake, alert  and oriented  Airway & Oxygen Therapy: Patient Spontanous Breathing and Patient connected to nasal cannula oxygen  Post-op Assessment: Report given to RN and Post -op Vital signs reviewed and stable  Post vital signs: Reviewed and stable  Last Vitals:  Filed Vitals:   11/07/14 1308  BP: 128/4  Pulse: 64  Temp:   Resp:     Complications: No apparent anesthesia complications

## 2014-11-07 NOTE — Anesthesia Postprocedure Evaluation (Signed)
  Anesthesia Post-op Note  Patient: [name]  Procedure(s) Performed: Procedure(s): ESOPHAGOGASTRODUODENOSCOPY (EGD) (N/A)  Patient Location: PACU  Anesthesia Type:MAC  Level of Consciousness: awake  Airway and Oxygen Therapy: Patient Spontanous Breathing  Post-op Pain: mild  Post-op Assessment: Post-op Vital signs reviewed              Post-op Vital Signs: Reviewed  Last Vitals:  Filed Vitals:   11/07/14 1400  BP:   Pulse: 72  Temp:   Resp: 18    Complications: No apparent anesthesia complications

## 2014-11-07 NOTE — Op Note (Signed)
[facility] [place], [id number]   ENDOSCOPY PROCEDURE REPORT  PATIENT: [name], [name]  MR#: [id number] BIRTHDATE: 09/11/1950 , 63  yrs. old GENDER: female ENDOSCOPIST: Eustace Quail, MD REFERRED BY:  [facility] PROCEDURE DATE:  11/07/2014 PROCEDURE:  EGD w/ biopsy and EGD w/ control of bleeding ASA CLASS:     Class III INDICATIONS:  epigastric pain, iron deficiency anemia, hemocult positive stool, and abnormal CT of the GI tract. MEDICATIONS: Monitored anesthesia care and Per Anesthesia TOPICAL ANESTHETIC: none  DESCRIPTION OF PROCEDURE: After the risks benefits and alternatives of the procedure were thoroughly explained, informed consent was obtained.  The Pentax Gastroscope [id number] endoscope was introduced through the mouth and advanced to the second portion of the duodenum , Without limitations.  The instrument was slowly withdrawn as the mucosa was fully examined.  EXAM:The esophagus was normal.  Stomach revealed linear ulceration as well as a 5 mm ulcer in the antrum and prepyloric region respectively.  One half of the duodenal bulb was replaced with ulcer.  Large central pigmented protuberance.  No active bleeding. The post bulbar duodenum was normal.  THERAPY: 2 endoclips were deployed on the pigmented protuberance.  CLO biopsy taken. Retroflexed views revealed a hiatal hernia.     The scope was then withdrawn from the patient and the procedure completed. COMPLICATIONS: There were no immediate complications.  ENDOSCOPIC IMPRESSION: 1. Giant duodenal bulb ulcer with large central pigmented protuberance status post Endo Clip -2 2. Minor antral ulceration as described. Status post CLOtest  RECOMMENDATIONS: 1.  Recommend twice a day PPI indefinitely 2.  Avoid NSAIDS indefinitely 3.  Rx CLO if positive 4. Oral iron replacement therapy recommended 5. Needs to have her blood counts monitored as outpatient to  assure improvement. Also, therapies other than NSAIDs for her aches and pains. PCP should assist with these 6. Resume diet  REPEAT EXAM:  eSigned:  Eustace Quail, MD 11/07/2014 1:51 PM    SN:[id number] Fuller Plan, MD, Internal Medicine Teaching Service and The Patient

## 2014-11-08 DIAGNOSIS — D62 Acute posthemorrhagic anemia: Secondary | ICD-10-CM | POA: Diagnosis not present

## 2014-11-08 DIAGNOSIS — K253 Acute gastric ulcer without hemorrhage or perforation: Secondary | ICD-10-CM | POA: Diagnosis not present

## 2014-11-08 DIAGNOSIS — K264 Chronic or unspecified duodenal ulcer with hemorrhage: Secondary | ICD-10-CM | POA: Diagnosis not present

## 2014-11-08 DIAGNOSIS — K269 Duodenal ulcer, unspecified as acute or chronic, without hemorrhage or perforation: Secondary | ICD-10-CM | POA: Diagnosis not present

## 2014-11-08 DIAGNOSIS — K922 Gastrointestinal hemorrhage, unspecified: Secondary | ICD-10-CM | POA: Diagnosis not present

## 2014-11-08 LAB — CBC
HEMATOCRIT: 24.9 % — AB (ref 36.0–46.0)
HEMOGLOBIN: 7.7 g/dL — AB (ref 12.0–15.0)
MCH: 30 pg (ref 26.0–34.0)
MCHC: 30.9 g/dL (ref 30.0–36.0)
MCV: 96.9 fL (ref 78.0–100.0)
Platelets: 467 10*3/uL — ABNORMAL HIGH (ref 150–400)
RBC: 2.57 MIL/uL — ABNORMAL LOW (ref 3.87–5.11)
RDW: 23.7 % — AB (ref 11.5–15.5)
WBC: 9.2 10*3/uL (ref 4.0–10.5)

## 2014-11-08 LAB — PREPARE RBC (CROSSMATCH)

## 2014-11-08 LAB — CLOTEST (H. PYLORI), BIOPSY: Helicobacter screen: NEGATIVE

## 2014-11-08 MED ORDER — SODIUM CHLORIDE 0.9 % IV SOLN
Freq: Once | INTRAVENOUS | Status: AC
  Administered 2014-11-08: 10 mL/h via INTRAVENOUS

## 2014-11-08 NOTE — Progress Notes (Signed)
Patient blood transfusion of one unit completed, pt tolerated well.11/08/2014 4:50 PM [name]

## 2014-11-08 NOTE — Progress Notes (Signed)
   Subjective: Still no bowel movement, reports she is weak and not feeling ready for discharge.  Sister is traveling up to [facility] tomorrow to help her though.   Objective: Vital signs in last 24 hours: Filed Vitals:   11/07/14 1438 11/07/14 2010 11/07/14 2142 11/08/14 0623  BP: 149/59  134/62 124/61  Pulse: 65  92 67  Temp:   98.5 F (36.9 C) 98.8 F (37.1 C)  TempSrc:   Oral Oral  Resp: 20  20 16   Height:      Weight:      SpO2: 93% 98% 98% 98%     Vitals are reviewed General: resting in chair NAD CV: RRR, no m/r/g Resp: CTAB GYI:RSWNIOEVO, +BS  Extremities: no clubbing cyanosis or edema  Lab Results: Labs, imaging, micro and cultures are reviewed, and the pertinent ones are discussed either in Subjective or in the Assessment and Plan  Micro Results:  Studies/Results: No results found. Medications: I have reviewed the patient's current medications. Scheduled Meds: . sodium chloride   Intravenous Once  . budesonide  0.5 mg Nebulization BID  . ferrous sulfate  325 mg Oral BID WC  . fluticasone  1 spray Each Nare Daily  . gabapentin  300 mg Oral TID  . ipratropium-albuterol  3 mL Nebulization QID  . lamoTRIgine  100 mg Oral Daily  . mometasone-formoterol  2 puff Inhalation BID  . pantoprazole  40 mg Oral BID  . sodium chloride  3 mL Intravenous Q12H   Continuous Infusions:  PRN Meds:.acetaminophen, albuterol, oxyCODONE, sodium chloride Assessment/Plan:  64 yo woman with h/o IDA requiring multiple PRBC and ferraheme tranfusions in the past, COPD with 4L O2 at home, smoking history, and anxiety, admitted for dizziness and found to have a HgB of 6.1, and given 2 units PRBCs  Acute on chronic Iron deficiency anemia secondary to chronic GI bleed from multiple stomach ulcerations likely due to NASID use. : -EGD today revealed a gaint ulceration which was treated with clips as well as some other minor ulcerations.  Biopsies were sent for H Pylori testing although likely  cause is NSAIDs.   - Will need indefinite BID PPI, will transition to PO - AVOID NSAIDS - Follow up biopsy results - Repeat CBC today shows Hgb stable at 7.7 however she is still somewhat symptomatic, will give an additional 1 unit and plan for d/c home tomorrow.   Thrombocytosis: most likely reactive thrombocytosis, -Will monitor  COPD: On 3 L [place] On albuterol, pulmicort, and duonebs   #FEN:  -Diet: clear liquids, advance as tolerated  #DVT prophylaxis: scds  #CODE STATUS: full   Dispo: D/c tomorrow  The patient does have a current PCP (Rushil Sherrye Payor, MD) and does need an [facility] hospital follow-up appointment after discharge.  The patient does not have transportation limitations that hinder transportation to clinic appointments.  .Services Needed at time of discharge: Y = Yes, Blank = No PT:   OT:   RN:   Equipment:   Other:       Lucious Groves, DO 11/08/2014, 12:05 PM

## 2014-11-08 NOTE — Progress Notes (Signed)
   Patient Name: [name] Date of Encounter: 11/08/2014, 11:19 AM    Subjective  Weak, some epigastric pain Eating reg food no ptroblem   Objective  BP 124/61 mmHg  Pulse 67  Temp(Src) 98.8 F (37.1 C) (Oral)  Resp 16  Ht 5\' 1"  (1.549 m)  Wt 93 lb 6.4 oz (42.366 kg)  BMI 17.66 kg/m2  SpO2 98% Abd soft, NT BS +  CBC Latest Ref Rng 11/08/2014 11/07/2014 11/06/2014  WBC 4.0 - 10.5 K/uL 9.2 4.6 6.1  Hemoglobin 12.0 - 15.0 g/dL 7.7(L) 7.3(L) 7.9(L)  Hematocrit 36.0 - 46.0 % 24.9(L) 23.0(L) 24.2(L)  Platelets 150 - 400 K/uL 467(H) 440(H) 454(H)       Assessment and Plan  Giant duodenal bulb ulcer w/ prior hemorrhage, s/p clips  Acute blood loss anemia  Antral ulcers    Agree w/ another day here I think she would benefit from 1-2 U PRBC for more reserve Otherwise as previously recommended   Gatha Mayer, MD, [facility] [phone] (pager) 11/08/2014 11:19 AM

## 2014-11-08 NOTE — Progress Notes (Signed)
  PROGRESS NOTE MEDICINE TEACHING ATTENDING   Day  of stay Patient name: [name]   Medical record number: [id number] Date of birth: 28-May-1950   Ms [name] feels exhausted today.   Physcial Exam Blood pressure 124/61, pulse 67, temperature 98.8 F (37.1 C), temperature source Oral, resp. rate 16, height 5\' 1"  (1.549 m), weight 93 lb 6.4 oz (42.366 kg), SpO2 98 %. The patient is alert and oriented, comfortable, in no acute distress. PERRL, EOMI. Heart exhibits regular rate and rhythm, no murmurs. Lungs are clear to auscultation. Abdomen is soft with tenderness to palpation around epigastrium. There is no pedal edema and good pedal pulses. There are no gross focal neurological deficits apparent.   Assessment/Plan Gastric ulcer with symptomatic anemia - Hgb improved with IV iron. Ulcer has been clipped. However patient continues to feel weak and exhausted which is understandable given the extent of her disease. This morning, discussion centered around avoidance of NSAIDs, and compliance with PPIs. I think its reasonable to keep the patient overnight to allow her the time to recuperate, given her debility and that she has not help at home today. We would also be able to monitor her counts after the procedure and ensure that there is no sudden drop in hgb. Given that she has no renal impairment, sucralfate might be an option for this patient, but I would defer to GI advice on this since they are consulting. Patient's sister is coming tomorrow to help her with her illness for a few days.   I have discussed the care of this patient with my IM team residents. Please see the resident note for details.  [place], Doctor Phillips 11/08/2014, 10:02 AM.

## 2014-11-09 DIAGNOSIS — K269 Duodenal ulcer, unspecified as acute or chronic, without hemorrhage or perforation: Secondary | ICD-10-CM | POA: Diagnosis not present

## 2014-11-09 DIAGNOSIS — K264 Chronic or unspecified duodenal ulcer with hemorrhage: Secondary | ICD-10-CM | POA: Diagnosis not present

## 2014-11-09 DIAGNOSIS — D62 Acute posthemorrhagic anemia: Secondary | ICD-10-CM | POA: Diagnosis not present

## 2014-11-09 DIAGNOSIS — K253 Acute gastric ulcer without hemorrhage or perforation: Secondary | ICD-10-CM | POA: Diagnosis not present

## 2014-11-09 LAB — CBC
HCT: 28.5 % — ABNORMAL LOW (ref 36.0–46.0)
HEMOGLOBIN: 9 g/dL — AB (ref 12.0–15.0)
MCH: 30.7 pg (ref 26.0–34.0)
MCHC: 31.6 g/dL (ref 30.0–36.0)
MCV: 97.3 fL (ref 78.0–100.0)
PLATELETS: 402 10*3/uL — AB (ref 150–400)
RBC: 2.93 MIL/uL — ABNORMAL LOW (ref 3.87–5.11)
RDW: 21.7 % — AB (ref 11.5–15.5)
WBC: 5 10*3/uL (ref 4.0–10.5)

## 2014-11-09 LAB — TYPE AND SCREEN
ABO/RH(D): O POS
ANTIBODY SCREEN: NEGATIVE
UNIT DIVISION: 0

## 2014-11-09 MED ORDER — FENTANYL CITRATE (PF) 100 MCG/2ML IJ SOLN: 25.0000 ug | INTRAMUSCULAR | Status: DC | PRN

## 2014-11-09 MED ORDER — PANTOPRAZOLE SODIUM 40 MG PO TBEC: 40.0000 mg | DELAYED_RELEASE_TABLET | Freq: Two times a day (BID) | ORAL | Status: DC

## 2014-11-09 MED ORDER — PROMETHAZINE HCL 25 MG/ML IJ SOLN: 6.2500 mg | INTRAMUSCULAR | Status: DC | PRN

## 2014-11-09 NOTE — Discharge Instructions (Signed)
Please take the pantoprazole twice a day Please stop taking and do not take any advil, motrin, ibuprofen, "back and body" and any other type of NSAIDS.  Please follow up in our clinic next week- they'll call you to make the appointment

## 2014-11-09 NOTE — Progress Notes (Signed)
   Subjective:  No acute events overnight, eating breakfast when examined Patient feels better and eager to go home, her sister is coming by soon to pick her up.  Overnight received 1 unit of PRBC.    Objective: Vitals are reviewed General: seems better, eating breakfast when examined, on 4 L [place] at 99% sp02 CV: RRR, no m/r/g Resp: diffuse end expiratory wheezing heard bilaterally Abd: mild epigastric tenderness to palpation, +BS in all 4  Labs, imaging, micro and cultures are reviewed, and the pertinent ones are discussed either in Subjective or in the Assessment and Plan  A/P 64 yo woman with h/o IDA requiring multiple PRBC and ferraheme tranfusions in the past, COPD with 4L O2 at home, smoking history, and anxiety, admitted for dizziness and found to have a HgB of 6.1, and given 2 units PRBCs  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 She was FOBT + and also has longstanding IDA with some component of anemia of chronic disease, CT angio showed duodenitis and gastritis. Patient has extensive history of NSAID use "back and body" making PUD likely. She had an EGD done which showed ginat ulcers which were clipped.and biopsies were sent for hpylori.   -received 1 unit prbc, current HgB 9 -HPylori negative -On Protonix 40 mg BID IV- will need indefinite BID PPI -Will need follow up appt in clinic.    Thrombocytosis: most likely reactive thrombocytosis, -Platelets trending down.  COPD: On 4 L [place] On albuterol, pulmicort, and duonebs     Burgess Estelle, MD 11/09/2014, 9:26 AM

## 2014-11-09 NOTE — Progress Notes (Signed)
   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 [phone] (pager) 11/09/2014 10:53 AM

## 2014-11-09 NOTE — Discharge Summary (Signed)
Name: [name] MRN: [id number] DOB: January 21, 1951 64 y.o. PCP: Riccardo Dubin, MD  Date of Admission: 11/04/2014 10:29 AM Date of Discharge: 11/09/2014 Attending Physician: Axel Filler, MD  Discharge Diagnosis: 1. Duodenal bleeding ulcer leading to her anemia, which was clipped. Principal Problem:   Acute gastric ulcer Active Problems:   Bipolar I disorder, most recent episode depressed   COPD (chronic obstructive pulmonary disease) with emphysema   Acute blood loss anemia   Upper GI bleed   Abnormal CT of the abdomen   Duodenal ulcer with hemorrhage  Discharge Medications:   Medication List    STOP taking these medications        calcium carbonate 500 MG chewable tablet  Commonly known as:  TUMS - dosed in mg elemental calcium     ondansetron 4 MG disintegrating tablet  Commonly known as:  ZOFRAN ODT     Oxycodone HCl 10 MG Tabs     zolpidem 5 MG tablet  Commonly known as:  AMBIEN      TAKE these medications        alendronate 70 MG tablet  Commonly known as:  FOSAMAX  Take 1 tablet (70 mg total) by mouth once a week. Take with a full glass of water on an empty stomach.     budesonide 0.5 MG/2ML nebulizer solution  Commonly known as:  PULMICORT  Take 2 mLs (0.5 mg total) by nebulization 2 (two) times daily. DX j43.8     ferrous sulfate 325 (65 FE) MG tablet  Take 1 tablet (325 mg total) by mouth daily. Take once a day or every other day, as tolerated.     fluticasone 50 MCG/ACT nasal spray  Commonly known as:  FLONASE  Place 1 spray into both nostrils daily.     Fluticasone-Salmeterol 250-50 MCG/DOSE Aepb  Commonly known as:  ADVAIR DISKUS  INHALE 1 PUFF INTO THE LUNGS 2 TIMES DAILY     gabapentin 300 MG capsule  Commonly known as:  NEURONTIN  Take 300 mg by mouth 3 (three) times daily.     ipratropium-albuterol 0.5-2.5 (3) MG/3ML Soln  Commonly known as:  DUONEB  4 times a day (breakfast, lunch, dinner, bedtime).Can take 2 additional  treatments if needed on bad days. DX J43.8     lamoTRIgine 100 MG tablet  Commonly known as:  LAMICTAL  TAKE ONE TABLET EVERY DAY     loratadine 10 MG tablet  Commonly known as:  CLARITIN  Take 10 mg by mouth daily.     pantoprazole 40 MG tablet  Commonly known as:  PROTONIX  Take 1 tablet (40 mg total) by mouth 2 (two) times daily.     polyethylene glycol powder powder  Commonly known as:  GLYCOLAX/MIRALAX  TAKE 17G DISSOLVED IN 8OZ WATER DAILY ASNEEDED FOR MODERATE CONSTIPATION     PROAIR HFA 108 (90 BASE) MCG/ACT inhaler  Generic drug:  albuterol  INHALE ONE PUFF BY MOUTH EVERY 2 TO 4 HOURS AS NEEDED FOR WHEEZING     Vitamin D (Ergocalciferol) 50000 UNITS Caps capsule  Commonly known as:  DRISDOL  Take 1 capsule (50,000 Units total) by mouth every 7 (seven) days.        Disposition and follow-up:   [name] was discharged from [facility] in Good condition.  At the hospital follow up visit please address:  1.  [name] is already back in the hospital  2.  Labs / imaging needed  at time of follow-up: CBC  3.  Pending labs/ test needing follow-up:   Follow-up Appointments:     Follow-up Information    Follow up with Juluis Mire, MD. Go in 1 week.   Specialty:  Internal Medicine   Why:  The clinic will call you to schedule appointment day and time   Contact information:   [place] [address] [phone]       Discharge Instructions: Discharge Instructions    Diet - low sodium heart healthy    Complete by:  As directed      Discharge instructions    Complete by:  As directed   Please take the pantoprazole twice a day Please stop taking and do not take any advil, motrin, ibuprofen, "back and body" and any other type of NSAIDS.  Please follow up in our clinic next week- they'll call you to make the appointment     Increase activity slowly    Complete by:  As directed            Consultations: Treatment  Team:  Irene Shipper, MD  Procedures Performed:  Ct Head Wo Contrast  11/04/2014   CLINICAL DATA:  Patient with syncopal episode  EXAM: CT HEAD WITHOUT CONTRAST  TECHNIQUE: Contiguous axial images were obtained from the base of the skull through the vertex without intravenous contrast.  COMPARISON:  Brain CT 11/04/2014  FINDINGS: Ventricles and sulci are appropriate for patient's age. No evidence for acute cortically based infarct, intracranial hemorrhage, mass lesion or mass-effect. Orbits are unremarkable. Paranasal sinuses are well aerated. Mastoid air cells are unremarkable. Calvarium is intact.  IMPRESSION: No acute intracranial process.   Electronically Signed   By: Lovey Newcomer M.D.   On: 11/04/2014 14:29   Dg Chest Portable 1 View  10/26/2014   CLINICAL DATA:  Increasing weakness and fatigue  EXAM: PORTABLE CHEST - 1 VIEW  COMPARISON:  08/05/2014  FINDINGS: The heart size and mediastinal contours are within normal limits. Both lungs are clear. The visualized skeletal structures are unremarkable.  IMPRESSION: No active disease.   Electronically Signed   By: Inez Catalina M.D.   On: 10/26/2014 18:14   Ct Angio Abd/pel W/ And/or W/o  11/04/2014   CLINICAL DATA:  Syncope. Dropping hemoglobin. Abdominal pain. Concern for AAA or mesenteric ischemia.  EXAM: CTA ABDOMEN AND PELVIS wITHOUT AND WITH CONTRAST  TECHNIQUE: Multidetector CT imaging of the abdomen and pelvis was performed using the standard protocol during bolus administration of intravenous contrast. Multiplanar reconstructed images and MIPs were obtained and reviewed to evaluate the vascular anatomy.  CONTRAST:  179mL OMNIPAQUE IOHEXOL 350 MG/ML SOLN  COMPARISON:  08/05/2014  FINDINGS: Bilateral breast implants partially imaged. Lung bases are clear. Heart is normal size. Severe emphysematous changes noted in the lung bases. No effusions.  Aorta and iliac vessels are normal caliber with scattered atherosclerotic calcifications. No dissection.  Mesenteric vessels are widely patent. Calcified plaque at the origin of the renal arteries bilaterally without visible significant stenosis.  Liver, gallbladder, spleen, pancreas, adrenals and kidneys are unremarkable. Uterus, adnexae a and urinary bladder unremarkable.  Moderate stool within the colon.  Nonobstructive bowel gas pattern.  There is abnormal wall thickening in the region of the distal stomach and proximal duodenum, low likely gastritis and duodenitis. Cannot exclude peptic ulcer disease. Small amount of free fluid in the pelvis. No free air or adenopathy.  No acute bony abnormality. Degenerative facet disease and grade 1 anterolisthesis at  L4-5.  Review of the MIP images confirms the above findings.  IMPRESSION: Wall thickening in the distal stomach and proximal duodenum suggesting gastritis/ duodenitis.  No evidence of aortic aneurysm or dissection. No findings to suggest bowel ischemia.  Moderate stool burden throughout the colon.  Severe emphysematous changes noted in the lung bases.   Electronically Signed   By: Rolm Baptise M.D.   On: 11/04/2014 14:28    2D Echo:   Cardiac Cath:   Admission HPI:  [name] is a 64 year-old lady with history of COPD requiring 3 liters of oxygen at home, extensive tobacco abuse, bipolar disorder, and iron deficiency anemia who presents with a syncopal episode with pre-syncope. While smoking a cigarette outside, she started feeling short of breath, so she walked inside, felt light-headed for a moment, and passed out. Her friends said she didn't hit her head but she was jerking all over. She didn't bite her tongue or urinate nor defecate on herself. When she came to, she felt her normal self. She takes lamicatal for "mood issues" but has never had a seizure. She denied any chest pain or palpitations during the incident and recently denies any dark stools, hemoptysis, weight loss, or night sweats. She only complains of a distended, painful belly and  daily vomiting which she has had since she was discharge two weeks ago. Her last bowel movement was yesterday, she is passing gas, and other than 2 cesarian sections she has had no other abdominal surgeries. In the emergency department, she got a CT angiogram given concern for ruptured abdominal aortic aneurysm which was negative, but was notable gastritis and duodenitis, without obstructive pattern.  She was recently discharged on 8/15 for anemia; there was suspicion of a gastrointestinal bleed but she did not undergo endoscopy nor colonoscopy because she was deemed a poor candidate due to her COPD. The plan at that time was to get her a virtual CT scan which she never underwent. Additionally, during her most recent hospitalization, she got 1 dose of ferraheme. The plan was to get her another dose next week. The patient said she did receive a dose but I can't find any indication of this in the chart.  In the emergency department, she was hemodynamically stable, a CT of her head was negative, and a CTA of her abdomen was negative for AAA or obstruction but notable for gastritis and duodenitis. Her hemoglobin was 6.1, down from 7.8 the day prior in clinic. She received 1 unit of packed red blood cells and her hemoglobin rose to 7.1. Fecal occult blood test was positive.  Hospital Course by problem list: Principal Problem:   Acute gastric ulcer Active Problems:   Bipolar I disorder, most recent episode depressed   COPD (chronic obstructive pulmonary disease) with emphysema   Acute blood loss anemia   Upper GI bleed   Abnormal CT of the abdomen   Duodenal ulcer with hemorrhage   1.  Symptomatic Anemia secondary to bleeding duodenal ulcer found on the EGD: Patient has long standing iron deficiency anemia and history of NSAID use, and her hemoglobin dropped from 7.8 on admission to 6.1 within 1 day. She was previously hospitalized for similar complaints of syncope and fatigue and had received IV  ferraheme on the previous admission. In the ER, she underwent CT angio which had revealed gastritis and duodenitis and was FOBT positive. She received a unit of PRBCs. She successfully underwent an EGD with anesthesia which revealed giant ulcer which was  clipped and biopsies for hpylori was negative.  She had received another unit of PRBC.  She was sent home with protonix 40 mg BID and asked to avoid nsaids. She was told to follow up in our clinic next week.    Syncope: was due to the symptomatic anemia, and this had resolved by the time of the discharge after she received several units of PRBC.   Discharge Vitals:   BP 146/72 mmHg  Pulse 59  Temp(Src) 98.3 F (36.8 C) (Oral)  Resp 16  Ht 5\' 1"  (1.549 m)  Wt 91 lb 1.6 oz (41.323 kg)  BMI 17.22 kg/m2  SpO2 97%  Discharge Labs:  Results for orders placed or performed during the hospital encounter of 11/04/14 (from the past 24 hour(s))  Type and screen     Status: None   Collection Time: 11/08/14  1:00 PM  Result Value Ref Range   ABO/RH(D) O POS    Antibody Screen NEG    Sample Expiration 11/11/2014    Unit Number [id number]    Blood Component Type RBC LR PHER2    Unit division 00    Status of Unit ISSUED,FINAL    Transfusion Status OK TO TRANSFUSE    Crossmatch Result Compatible   Prepare RBC     Status: None   Collection Time: 11/08/14  1:00 PM  Result Value Ref Range   Order Confirmation ORDER PROCESSED BY BLOOD BANK   CBC     Status: Abnormal   Collection Time: 11/09/14  7:05 AM  Result Value Ref Range   WBC 5.0 4.0 - 10.5 K/uL   RBC 2.93 (L) 3.87 - 5.11 MIL/uL   Hemoglobin 9.0 (L) 12.0 - 15.0 g/dL   HCT 28.5 (L) 36.0 - 46.0 %   MCV 97.3 78.0 - 100.0 fL   MCH 30.7 26.0 - 34.0 pg   MCHC 31.6 30.0 - 36.0 g/dL   RDW 21.7 (H) 11.5 - 15.5 %   Platelets 402 (H) 150 - 400 K/uL    Signed: Burgess Estelle, MD 11/09/2014, 10:00 AM    Services Ordered on Discharge:  Equipment Ordered on Discharge:

## 2014-11-09 NOTE — Progress Notes (Signed)
Patient discharged to home with friend. Pt recieved discharge instructions and education.All questions addressed. IV dc'd. Vitals stable for pt.

## 2014-11-11 ENCOUNTER — Ambulatory Visit (INDEPENDENT_AMBULATORY_CARE_PROVIDER_SITE_OTHER): Payer: Medicare Other | Admitting: Internal Medicine

## 2014-11-11 ENCOUNTER — Encounter: Payer: Self-pay | Admitting: Licensed Clinical Social Worker

## 2014-11-11 ENCOUNTER — Encounter (HOSPITAL_COMMUNITY): Payer: Self-pay | Admitting: Internal Medicine

## 2014-11-11 ENCOUNTER — Observation Stay (HOSPITAL_COMMUNITY): Payer: Medicare Other

## 2014-11-11 ENCOUNTER — Encounter (HOSPITAL_COMMUNITY): Payer: Self-pay | Admitting: General Practice

## 2014-11-11 ENCOUNTER — Observation Stay (HOSPITAL_COMMUNITY)
Admission: AD | Admit: 2014-11-11 | Discharge: 2014-11-13 | Disposition: A | Discharge status: Home / Self Care | Payer: Medicare Other | Source: Ambulatory Visit | Attending: Internal Medicine | Admitting: Internal Medicine

## 2014-11-11 VITALS — BP 120/60 | HR 60 | Temp 98.5°F | Ht 61.0 in | Wt 91.0 lb

## 2014-11-11 DIAGNOSIS — R55 Syncope and collapse: Secondary | ICD-10-CM | POA: Diagnosis not present

## 2014-11-11 DIAGNOSIS — Z9981 Dependence on supplemental oxygen: Secondary | ICD-10-CM | POA: Insufficient documentation

## 2014-11-11 DIAGNOSIS — F319 Bipolar disorder, unspecified: Secondary | ICD-10-CM | POA: Insufficient documentation

## 2014-11-11 DIAGNOSIS — E861 Hypovolemia: Secondary | ICD-10-CM | POA: Insufficient documentation

## 2014-11-11 DIAGNOSIS — K5900 Constipation, unspecified: Secondary | ICD-10-CM | POA: Diagnosis not present

## 2014-11-11 DIAGNOSIS — K5901 Slow transit constipation: Secondary | ICD-10-CM

## 2014-11-11 DIAGNOSIS — F17210 Nicotine dependence, cigarettes, uncomplicated: Secondary | ICD-10-CM | POA: Insufficient documentation

## 2014-11-11 DIAGNOSIS — D509 Iron deficiency anemia, unspecified: Secondary | ICD-10-CM | POA: Insufficient documentation

## 2014-11-11 DIAGNOSIS — Z7951 Long term (current) use of inhaled steroids: Secondary | ICD-10-CM | POA: Insufficient documentation

## 2014-11-11 DIAGNOSIS — K253 Acute gastric ulcer without hemorrhage or perforation: Secondary | ICD-10-CM

## 2014-11-11 DIAGNOSIS — I951 Orthostatic hypotension: Secondary | ICD-10-CM | POA: Diagnosis not present

## 2014-11-11 DIAGNOSIS — J449 Chronic obstructive pulmonary disease, unspecified: Secondary | ICD-10-CM | POA: Insufficient documentation

## 2014-11-11 HISTORY — DX: Chronic duodenal ulcer without hemorrhage or perforation: K26.7

## 2014-11-11 LAB — BASIC METABOLIC PANEL
Anion gap: 10 (ref 5–15)
BUN: 8 mg/dL (ref 6–20)
CO2: 32 mmol/L (ref 22–32)
CREATININE: 0.41 mg/dL — AB (ref 0.44–1.00)
Calcium: 8.1 mg/dL — ABNORMAL LOW (ref 8.9–10.3)
Chloride: 100 mmol/L — ABNORMAL LOW (ref 101–111)
Glucose, Bld: 89 mg/dL (ref 65–99)
POTASSIUM: 3.9 mmol/L (ref 3.5–5.1)
SODIUM: 142 mmol/L (ref 135–145)

## 2014-11-11 LAB — CBC
HCT: 35.7 % — ABNORMAL LOW (ref 36.0–46.0)
Hemoglobin: 11.2 g/dL — ABNORMAL LOW (ref 12.0–15.0)
MCH: 30.7 pg (ref 26.0–34.0)
MCHC: 31.4 g/dL (ref 30.0–36.0)
MCV: 97.8 fL (ref 78.0–100.0)
PLATELETS: 615 10*3/uL — AB (ref 150–400)
RBC: 3.65 MIL/uL — AB (ref 3.87–5.11)
RDW: 21 % — AB (ref 11.5–15.5)
WBC: 4.9 10*3/uL (ref 4.0–10.5)

## 2014-11-11 MED ORDER — PANTOPRAZOLE SODIUM 40 MG PO TBEC
40.0000 mg | DELAYED_RELEASE_TABLET | Freq: Two times a day (BID) | ORAL | Status: DC
  Administered 2014-11-11 – 2014-11-13 (×4): 40 mg via ORAL
  Filled 2014-11-11 (×4): qty 1

## 2014-11-11 MED ORDER — SODIUM CHLORIDE 0.9 % IJ SOLN: 3.0000 mL | INTRAMUSCULAR | Status: DC | PRN

## 2014-11-11 MED ORDER — OXYCODONE HCL 5 MG PO TABS
5.0000 mg | ORAL_TABLET | Freq: Once | ORAL | Status: AC
  Administered 2014-11-11: 5 mg via ORAL
  Filled 2014-11-11: qty 1

## 2014-11-11 MED ORDER — LAMOTRIGINE 25 MG PO TABS
100.0000 mg | ORAL_TABLET | Freq: Every day | ORAL | Status: DC
  Administered 2014-11-12 – 2014-11-13 (×2): 100 mg via ORAL
  Filled 2014-11-11 (×2): qty 4

## 2014-11-11 MED ORDER — MOMETASONE FURO-FORMOTEROL FUM 100-5 MCG/ACT IN AERO
2.0000 | INHALATION_SPRAY | Freq: Two times a day (BID) | RESPIRATORY_TRACT | Status: DC
  Administered 2014-11-11 – 2014-11-13 (×4): 2 via RESPIRATORY_TRACT
  Filled 2014-11-11: qty 8.8

## 2014-11-11 MED ORDER — SODIUM CHLORIDE 0.9 % IV SOLN
INTRAVENOUS | Status: AC
  Administered 2014-11-11 – 2014-11-12 (×2): via INTRAVENOUS

## 2014-11-11 MED ORDER — SODIUM CHLORIDE 0.9 % IJ SOLN
3.0000 mL | Freq: Two times a day (BID) | INTRAMUSCULAR | Status: DC
  Administered 2014-11-11 – 2014-11-12 (×2): 3 mL via INTRAVENOUS

## 2014-11-11 MED ORDER — SENNA 8.6 MG PO TABS
2.0000 | ORAL_TABLET | Freq: Three times a day (TID) | ORAL | Status: DC
  Administered 2014-11-11 – 2014-11-13 (×6): 17.2 mg via ORAL
  Filled 2014-11-11 (×6): qty 2

## 2014-11-11 MED ORDER — GABAPENTIN 300 MG PO CAPS
300.0000 mg | ORAL_CAPSULE | Freq: Three times a day (TID) | ORAL | Status: DC
  Administered 2014-11-11 – 2014-11-13 (×5): 300 mg via ORAL
  Filled 2014-11-11 (×5): qty 1

## 2014-11-11 MED ORDER — SODIUM CHLORIDE 0.9 % IJ SOLN
3.0000 mL | Freq: Two times a day (BID) | INTRAMUSCULAR | Status: DC
  Administered 2014-11-11: 3 mL via INTRAVENOUS

## 2014-11-11 MED ORDER — INFLUENZA VAC SPLIT QUAD 0.5 ML IM SUSY
0.5000 mL | PREFILLED_SYRINGE | INTRAMUSCULAR | Status: DC
  Filled 2014-11-11: qty 0.5

## 2014-11-11 MED ORDER — OXYCODONE HCL 5 MG PO TABS
5.0000 mg | ORAL_TABLET | ORAL | Status: DC | PRN
  Administered 2014-11-11 – 2014-11-12 (×5): 5 mg via ORAL
  Filled 2014-11-11 (×5): qty 1

## 2014-11-11 MED ORDER — SODIUM CHLORIDE 0.9 % IV SOLN: 250.0000 mL | INTRAVENOUS | Status: DC | PRN

## 2014-11-11 MED ORDER — MAGNESIUM CITRATE PO SOLN
1.0000 | Freq: Once | ORAL | Status: DC
  Filled 2014-11-11: qty 296

## 2014-11-11 NOTE — H&P (Signed)
Date: 11/11/2014               Patient Name:  [name] MRN: [id number]  DOB: 1951/01/25 Age / Sex: 64 y.o., female   PCP: Jordan Dubin, MD         Medical Service: Internal Medicine Teaching Service         Attending Physician: Dr. Axel Filler, MD    First Contact: Dr. Zada Mcclure Pager: [phone]  Second Contact: Dr. Joni Mcclure Pager: [phone]       After Hours (After 5p/  First Contact Pager: [phone]  weekends / holidays): Second Contact Pager: [phone]   Chief Complaint: Abdominal Pain and Constipation  History of Present Illness: [name] is a 64 year old lady with PMH of COPD requiring 3 liters of home oxygen, iron deficiency anemia 2/2 to duodenal bulb ulcer s/p clips, and tobacco abuse who presented to the Internal Medicine Clinic for a hospital follow up visit with complaints of abdominal pain, nausea, and constipation. She was discharged from [facility] on 11/09/2014 after being admitted for symptomatic anemia with syncope. During hospital stay, she was found to have a giant duodenal bulb ulcer (which was clipped during EGD) as well as a minor antral ulcer likely due to NSAID use. Clotest (H. Phylori) biopsy was negative for urease. Patient received 3 Units PRBCs and 2 runs of IV iron while admitted which improved her Hgb levels and symptoms. She was started on lifelong PPI and oral iron on discharge.   Patient states that she continued to have abdominal pain when she left the hospital which she cannot associate with any inciting factor such as meals or positional change. Although she reports nausea, she did not have any episodes of emesis and was able to keep food and drinks down, but does admit to a decreased appetite. Since this time, she has not had a single bowel movement. She reports being able to urinate regularly. She reports occasional chills, but denies fever, diarrhea, dysuria, hematuria, emesis, or hematochezia. She was found to have  orthostatic hypotension while in clinic.  She is accompanied by her sister who is visiting from [place] to help with patient's care at home and applying for enrollment in [facility].   Meds: Current Facility-Administered Medications  Medication Dose Route Frequency Provider Last Rate Last Dose  . 0.9 %  sodium chloride infusion  250 mL Intravenous PRN Jordan Groves, DO      . gabapentin (NEURONTIN) capsule 300 mg  300 mg Oral TID Jordan Groves, DO      . [START ON 11/12/2014] lamoTRIgine (LAMICTAL) tablet 100 mg  100 mg Oral Daily Jordan Groves, DO      . mometasone-formoterol (DULERA) 100-5 MCG/ACT inhaler 2 puff  2 puff Inhalation BID Jordan Groves, DO      . pantoprazole (PROTONIX) EC tablet 40 mg  40 mg Oral BID Jordan Groves, DO      . sodium chloride 0.9 % injection 3 mL  3 mL Intravenous Q12H Jordan Groves, DO   3 mL at 11/11/14 1545  . sodium chloride 0.9 % injection 3 mL  3 mL Intravenous Q12H Jordan Groves, DO   0 mL at 11/11/14 1545  . sodium chloride 0.9 % injection 3 mL  3 mL Intravenous PRN Jordan Groves, DO        Allergies: Allergies as of 11/11/2014 - Review Complete 11/11/2014  Allergen Reaction Noted  . Banana Shortness Of Breath and Nausea And Vomiting 01/10/2012  . Lyrica [pregabalin] Other (See Comments) 06/26/2014  . Pollen extract Other (See Comments) 11/20/2012   Past Medical History  Diagnosis Date  . Depression   . Suicidal ideation 2007     attempted overdose 2012  Dr. Tivis Mcclure report  . Bipolar disorder   . Substance abuse      narcotics, alcohol, tobacco  . Chronic pain syndrome     follows [facility]  . DEFICIENCY, VITAMIN D NOS 01/03/2007  . Chronic lower back pain   . Anxiety   . OSTEOPOROSIS 06/17/2009    DEXA 05/2009 : L femur -2.9; R femur -2.5. Alendronate on med list but not taking. Needs addressed ASAP as h/o fractures.    . Elevated liver function tests   . On home oxygen therapy      "3L; 24/7" (03/20/2014)  . Shortness of breath     "all the time" (02/27/2013)  . 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 Sciatic pain   . Anemia   . Pneumonia X 1?  . Hyperthyroidism     "borderline"  . History of blood transfusion     "getting my 1st later tonight" (03/20/2014)  . Arthritis     "legs, back; hips, primarily left hip" (03/20/2014)   Past Surgical History  Procedure Laterality Date  . Orif ankle fracture Left 10/2010  . Cesarean section  1974; 1979  . Tubal ligation  1979  . Augmentation mammaplasty  ~ 2007  . Fracture surgery    . Ankle debridement Left 10/2010  . Esophagogastroduodenoscopy N/A 11/07/2014    Procedure: ESOPHAGOGASTRODUODENOSCOPY (EGD);  Surgeon: Jordan Shipper, MD;  Location: [facility] ENDOSCOPY;  Service: Endoscopy;  Laterality: N/A;   Family History  Problem Relation Age of Onset  . Stroke Neg Hx   . Cancer Neg Hx   . Myelodysplastic syndrome Father     Died from myelofibrosis though diagnosed post-mortem   Social History   Social History  . Marital Status: Divorced    Spouse Name: N/A  . Number of Children: 2  . Years of Education: N/A   Occupational History  . retired    Social History Main Topics  . Smoking status: Current Every Day Smoker -- 0.20 packs/day for 45 years    Types: Cigarettes  . Smokeless tobacco: Never Used     Comment: 8/22 "smoking 1 cigarettes/day; that's down from 2 ppd"  . Alcohol Use: 0.0 oz/week    0 Standard drinks or equivalent per week     Comment: 03/20/2014 "nothing in 7 years; never had a problem w/it"  . Drug Use: No  . Sexual Activity: No   Other Topics Concern  . 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  Physical Exam: Blood pressure 119/61, pulse 70, temperature 98.8 F (37.1 C), temperature source Oral, height 5\' 1"  (1.549 m), weight 92 lb 9.6 oz (42.003 kg), SpO2 96 %. Physical Exam  Constitutional: She is oriented to person, place, and time.  No distress.  Thin appearance, on nasal cannula.  HENT:  Head: Normocephalic and atraumatic.  Eyes: EOM are normal.  Cardiovascular: Normal rate and regular rhythm.   No murmur heard. Pulmonary/Chest:  Bibasilar crackles heard on auscultation.  Abdominal: Soft. Bowel sounds are normal. There is tenderness.  Musculoskeletal: She exhibits no edema or tenderness.  Neurological: She is alert and oriented to person, place, and time.  Skin: Skin is warm.  Psychiatric: She has a normal mood and affect.     Lab results: Basic Metabolic Panel:  Recent Labs  11/11/14 1120  NA 142  K 3.9  CL 100*  CO2 32  GLUCOSE 89  BUN 8  CREATININE 0.41*  CALCIUM 8.1*   Liver Function Tests: No results for input(s): AST, ALT, ALKPHOS, BILITOT, PROT, ALBUMIN in the last 72 hours. No results for input(s): LIPASE, AMYLASE in the last 72 hours. No results for input(s): AMMONIA in the last 72 hours. CBC:  Recent Labs  11/09/14 0705 11/11/14 1120  WBC 5.0 4.9  HGB 9.0* 11.2*  HCT 28.5* 35.7*  MCV 97.3 97.8  PLT 402* 615*   Cardiac Enzymes: No results for input(s): CKTOTAL, CKMB, CKMBINDEX, TROPONINI in the last 72 hours. BNP: No results for input(s): PROBNP in the last 72 hours. D-Dimer: No results for input(s): DDIMER in the last 72 hours. CBG: No results for input(s): GLUCAP in the last 72 hours. Hemoglobin A1C: No results for input(s): HGBA1C in the last 72 hours. Fasting Lipid Panel: No results for input(s): CHOL, HDL, LDLCALC, TRIG, CHOLHDL, LDLDIRECT in the last 72 hours. Thyroid Function Tests: No results for input(s): TSH, T4TOTAL, FREET4, T3FREE, THYROIDAB in the last 72 hours. Anemia Panel: No results for  input(s): VITAMINB12, FOLATE, FERRITIN, TIBC, IRON, RETICCTPCT in the last 72 hours. Coagulation: No results for input(s): LABPROT, INR in the last 72 hours. Urine Drug Screen: Drugs of Abuse     Component Value Date/Time   LABOPIA NONE DETECTED 08/06/2014 2045   COCAINSCRNUR NONE DETECTED 08/06/2014 2045   LABBENZ NONE DETECTED 08/06/2014 2045   AMPHETMU NONE DETECTED 08/06/2014 2045   THCU NONE DETECTED 08/06/2014 2045   LABBARB NONE DETECTED 08/06/2014 2045    Alcohol Level: No results for input(s): ETH in the last 72 hours. Urinalysis: No results for input(s): COLORURINE, LABSPEC, PHURINE, GLUCOSEU, HGBUR, BILIRUBINUR, KETONESUR, PROTEINUR, UROBILINOGEN, NITRITE, LEUKOCYTESUR in the last 72 hours.  Invalid input(s): APPERANCEUR  Imaging results:  No results found.    Assessment & Plan by Problem: Active Problems:   Orthostatic hypotension  Abdominal Pain/Constipation: Patient's recent finding of giant duodenal ulcer s/p EndoClips x2 and new oral iron use suggest that her abdominal discomfort is likely due to continued ulcer pain or iron supplement side effect. She was found to have orthostatic hypotension in clinic. I do not think this is from a bleed as patient's Hgb today is 11.2 up from 9.0 on day of discharge (8/28). More likely due to dehydration as patient's appetite and fluid intake have been low. Will need to consider possible clip failure, perforation of ulcer, or small bowel obstruction but think these are less likely as abdomen is only mildly tender to palpation, Hgb stable, and normal blood pressures.  -XRay Abdomen -Diet NPO except sips -IV NS fluid resuscitation -Pantoprazole 40 mg BID -Senokot -CBC am -PT/OT -Social Work for ALF placement     Dispo: Disposition is deferred at this time, awaiting improvement of current medical problems. Anticipated discharge in approximately 1-2 day(s).   The  patient does have a current PCP (Rushil Sherrye Payor, MD) and does  need an [facility] hospital follow-up appointment after discharge.  The patient does have transportation limitations that hinder transportation to clinic appointments.  Signed: Zada Finders, MD 11/11/2014, 4:17 PM

## 2014-11-11 NOTE — Progress Notes (Signed)
Report called to Romie Minus, RN on [place]. Patient is alert and oriented.  Transported via wheelchair to [address] Bed 1.  Pt has Oxygen at 3 liters.  Accompanied by family member.  Sander Nephew, RN 11/11/2014 12:15 PM

## 2014-11-11 NOTE — Progress Notes (Signed)
Patient refused to take the mg citrated that was order.  Patient did have a large BM at the beginning of the shift.  No acute distress will continue to monitor.

## 2014-11-11 NOTE — H&P (Signed)
Internal Medicine Attending Admission Note  I saw and evaluated the patient. I reviewed the resident's note and I agree with the resident's findings and plan as documented in the resident's note.  Assessment & Plan by Problem:  Orthostatic Hypotension: Appears mildly hypovolemic on exam today. Hgb is improving since discharge, no further melena or hematemesis, so I do not think she is having any further GI bleeding. Abdominal exam is mildly tender, but largely reassuring. Constipation could be leading to abdomin pain and diminished oral intake causing hypovolemia and other associated symptoms. Chronically she struggles with polypharmacy and several centrally acting medications which could also contribute to constitutional symptoms.  - 2 liters of IVF overnight and reassess.  - Abdominal xray to evaluate for stool burden or impaction - Hold narcotics for now, Senna 2 tabs q8 hours - Recheck Hgb in the morning, call MD for any melanic stools - Recheck orthostatics in the morning   Chief Complaint(s): Abdominal Pain  History - key components related to admission:  Patient is a 64 year old woman who was recently admitted for acute blood loss anemia due to upper GI bleed caused by a large peptic ulcer. This ulcer was the result of long-term nonsteroidal anti-inflammatory use. During her EGD a clip was deployed, and no residual bleeding was seen. She did fairly well during the hospitalization and was discharged on 8/27 to home. She was brought into the clinic today by her sister because she was not feeling well. Having increasing fatigue and tiredness throughout the day. Also having worsening diffuse abdominal pain which she describes as moderate in intensity. It has been over 4 days since her last bowel movement. Denies any other fevers or chills. No hematemesis, endorses decreased appetitie. Denies any cough or chest pain. Sister tells [place] that the patient was planning to enroll herself in an adult living  facility to help her chronically with her weakness and medication management. Found to have orthostatic hypotension in the clinic, and called in for direct admission.   Lab results: Reviewed in Epic  Physical Exam - key components related to admission:  Filed Vitals:   11/11/14 1246  BP: 119/61  Pulse: 70  Temp: 98.8 F (37.1 C)  TempSrc: Oral  Height: 5\' 1"  (1.549 m)  Weight: 92 lb 9.6 oz (42.003 kg)  SpO2: 96%    Gen: Tired appearing older woman ENT: Dry MM, pupils reactive to light CV: RRR, no murmurs Lungs: Unlabored, clear Abd: Soft, moderately tender throughout, no rebound or gaurding Ext: Warm, well perfused, no edema

## 2014-11-11 NOTE — Progress Notes (Signed)
Ms. Doneta Public presents today for her hospital follow-up.  Pt's sister, Stanton Kidney, accompanies patient.  Pt requesting admission paperwork/forms to be completed as she has been accepted to [facility].  CSW initiated hard copy of FL-2; however, suspended as pt is currently being admitted in to the hospital.

## 2014-11-12 DIAGNOSIS — K5900 Constipation, unspecified: Secondary | ICD-10-CM | POA: Diagnosis not present

## 2014-11-12 DIAGNOSIS — I951 Orthostatic hypotension: Secondary | ICD-10-CM | POA: Diagnosis not present

## 2014-11-12 DIAGNOSIS — R55 Syncope and collapse: Secondary | ICD-10-CM | POA: Diagnosis not present

## 2014-11-12 DIAGNOSIS — Z9981 Dependence on supplemental oxygen: Secondary | ICD-10-CM | POA: Diagnosis not present

## 2014-11-12 DIAGNOSIS — F17210 Nicotine dependence, cigarettes, uncomplicated: Secondary | ICD-10-CM | POA: Diagnosis not present

## 2014-11-12 DIAGNOSIS — F319 Bipolar disorder, unspecified: Secondary | ICD-10-CM | POA: Diagnosis not present

## 2014-11-12 DIAGNOSIS — Z7951 Long term (current) use of inhaled steroids: Secondary | ICD-10-CM | POA: Diagnosis not present

## 2014-11-12 DIAGNOSIS — D509 Iron deficiency anemia, unspecified: Secondary | ICD-10-CM | POA: Diagnosis not present

## 2014-11-12 DIAGNOSIS — E861 Hypovolemia: Secondary | ICD-10-CM | POA: Diagnosis not present

## 2014-11-12 DIAGNOSIS — J449 Chronic obstructive pulmonary disease, unspecified: Secondary | ICD-10-CM | POA: Diagnosis not present

## 2014-11-12 LAB — CBC
HCT: 29.6 % — ABNORMAL LOW (ref 36.0–46.0)
HEMOGLOBIN: 9.3 g/dL — AB (ref 12.0–15.0)
MCH: 30.7 pg (ref 26.0–34.0)
MCHC: 31.4 g/dL (ref 30.0–36.0)
MCV: 97.7 fL (ref 78.0–100.0)
Platelets: 562 10*3/uL — ABNORMAL HIGH (ref 150–400)
RBC: 3.03 MIL/uL — AB (ref 3.87–5.11)
RDW: 20.5 % — ABNORMAL HIGH (ref 11.5–15.5)
WBC: 5.1 10*3/uL (ref 4.0–10.5)

## 2014-11-12 MED ORDER — BUDESONIDE 0.5 MG/2ML IN SUSP
0.5000 mg | Freq: Two times a day (BID) | RESPIRATORY_TRACT | Status: DC
  Administered 2014-11-12 – 2014-11-13 (×2): 0.5 mg via RESPIRATORY_TRACT
  Filled 2014-11-12 (×2): qty 2

## 2014-11-12 MED ORDER — SODIUM CHLORIDE 0.9 % IV BOLUS (SEPSIS)
500.0000 mL | Freq: Once | INTRAVENOUS | Status: AC
  Administered 2014-11-12: 500 mL via INTRAVENOUS

## 2014-11-12 MED ORDER — OXYCODONE HCL 5 MG PO TABS
10.0000 mg | ORAL_TABLET | ORAL | Status: DC | PRN
  Administered 2014-11-12 – 2014-11-13 (×4): 10 mg via ORAL
  Filled 2014-11-12 (×6): qty 2

## 2014-11-12 MED ORDER — IPRATROPIUM-ALBUTEROL 0.5-2.5 (3) MG/3ML IN SOLN
3.0000 mL | Freq: Four times a day (QID) | RESPIRATORY_TRACT | Status: DC
  Administered 2014-11-12 – 2014-11-13 (×5): 3 mL via RESPIRATORY_TRACT
  Filled 2014-11-12 (×5): qty 3

## 2014-11-12 MED ORDER — SODIUM CHLORIDE 0.9 % IV SOLN
INTRAVENOUS | Status: AC
  Administered 2014-11-12 (×2): via INTRAVENOUS

## 2014-11-12 NOTE — Progress Notes (Signed)
Internal Medicine Attending:   I saw and examined the patient. I reviewed the resident's note and I agree with the resident's findings and plan as documented in the resident's note.  64 year old woman readmitted yesterday because of hypovolemia and orthostatic hypotension along with moderate abdominal pain due to constipation. Patient feels much better this morning. Labs were all stable with no recurrent signs of upper GI bleed. Abdominal X-ray showed moderate stool burden. Abdominal tenderness is improved today, though not completely resolved. Successfully had one bowel movement overnight. Received hydration and lightheadedness is improved. Still mildly orthostatic this morning, so we will continue IV hydration. Working with physical therapy today, plan for discharge to assisted living facility likely tomorrow. Continue bowel management, avoid centrally acting medications, avoid narcotics, avoid anticholinergics. Bipolar disease is currently well controlled on Lamotrigine only.

## 2014-11-12 NOTE — Evaluation (Signed)
Physical Therapy Evaluation Patient Details Name: [name] MRN: [id number] DOB: Mar 02, 1951 Today's Date: 11/12/2014   History of Present Illness  Patient is a 64 year old woman who was recently admitted for acute blood loss anemia due to upper GI bleed caused by a large peptic ulcer. This ulcer was the result of long-term nonsteroidal anti-inflammatory use. During her EGD a clip was deployed, and no residual bleeding was seen. She did fairly well during the hospitalization and was discharged on 8/27 to home. She was brought into the clinic today by her sister because she was not feeling well. Having increasing fatigue and tiredness throughout the day. Also having worsening diffuse abdominal pain which she describes as moderate in intensity  Clinical Impression  Pt presents with general deconditioning and decreased cardiopulmonary status affecting her Independence. Pt would benefit from skilled PT to maximize her mobility and Independence. Pt plans to d/c to ALF and recommend HHPT at the facility.    Follow Up Recommendations Home health PT (at ALF)    Equipment Recommendations  Other (comment) (repair brakes on her RW or order a new one if not fixable)    Recommendations for Other Services       Precautions / Restrictions Precautions Precautions: Fall Restrictions Weight Bearing Restrictions: No      Mobility  Bed Mobility Overal bed mobility: Modified Independent                Transfers Overall transfer level: Modified independent Equipment used: 4-wheeled walker             General transfer comment: brakes on her RW do not work  Ambulation/Gait Ambulation/Gait assistance: Network engineer (Feet): 50 Feet (50 ft x 2) Assistive device: 4-wheeled walker Gait Pattern/deviations: Step-through pattern;Decreased stride length Gait velocity: decreased   General Gait Details: ambulation with O2, sats stayed around 95%. sitting rest break on rw  after 50 ft.   Stairs            Wheelchair Mobility    Modified Rankin (Stroke Patients Only)       Balance Overall balance assessment: Needs assistance Sitting-balance support: No upper extremity supported Sitting balance-Leahy Scale: Good     Standing balance support: No upper extremity supported Standing balance-Leahy Scale: Fair                               Pertinent Vitals/Pain Pain Assessment: 0-10 Pain Score: 7  Pain Location: back Pain Descriptors / Indicators: Aching Pain Intervention(s): Limited activity within patient's tolerance;RN gave pain meds during session    Home Living Family/patient expects to be discharged to:: Assisted living Living Arrangements: Alone Available Help at Discharge: Personal care attendant Type of Home: Apartment Home Access: Elevator     Home Layout: One level Home Equipment: Environmental consultant - 4 wheels      Prior Function Level of Independence: Needs assistance   Gait / Transfers Assistance Needed: uses rolling walker outside of apartment but not in apartment           Hand Dominance        Extremity/Trunk Assessment   Upper Extremity Assessment: Defer to OT evaluation           Lower Extremity Assessment: Generalized weakness         Communication   Communication: No difficulties  Cognition Arousal/Alertness: Awake/alert Behavior During Therapy: WFL for tasks assessed/performed Overall Cognitive Status: Within Functional Limits for tasks  assessed                      General Comments      Exercises        Assessment/Plan    PT Assessment Patient needs continued PT services  PT Diagnosis Difficulty walking;Generalized weakness   PT Problem List Decreased strength;Decreased activity tolerance;Decreased balance;Decreased mobility;Cardiopulmonary status limiting activity  PT Treatment Interventions DME instruction;Therapeutic exercise;Balance training;Patient/family  education;Therapeutic activities;Functional mobility training;Gait training   PT Goals (Current goals can be found in the Care Plan section) Acute Rehab PT Goals Patient Stated Goal: To d/c to ALF PT Goal Formulation: With patient Time For Goal Achievement: 11/26/14 Potential to Achieve Goals: Good    Frequency Min 3X/week   Barriers to discharge        Co-evaluation               End of Session Equipment Utilized During Treatment: Gait belt;Oxygen Activity Tolerance: Patient limited by fatigue Patient left: in chair;with call bell/phone within reach Nurse Communication: Mobility status         Time: 4174-0814 PT Time Calculation (min) (ACUTE ONLY): 34 min   Charges:   PT Evaluation $Initial PT Evaluation Tier I: 1 Procedure PT Treatments $Gait Training: 8-22 mins   PT G Codes:        Lelon Mast 11/12/2014, 9:42 AM

## 2014-11-12 NOTE — Evaluation (Signed)
Occupational Therapy Evaluation Patient Details Name: [name] MRN: [id number] DOB: 1950/07/10 Today's Date: 11/12/2014    History of Present Illness Patient is a 64 year old woman who was recently admitted for acute blood loss anemia due to upper GI bleed caused by a large peptic ulcer. This ulcer was the result of long-term nonsteroidal anti-inflammatory use. During her EGD a clip was deployed, and no residual bleeding was seen. She did fairly well during the hospitalization and was discharged on 8/27 to home. She was brought into the clinic today by her sister because she was not feeling well. Having increasing fatigue and tiredness throughout the day. Also having worsening diffuse abdominal pain which she describes as moderate in intensity   Clinical Impression   Patient presenting with deconditioning. Patient mod I PTA. Patient currently functioning at an overall supervision level. Patient will benefit from acute OT to increase overall independence in the areas of ADLs, functional mobility, and overall safety in order to safely discharge to ALF.     Follow Up Recommendations  No OT follow up;Supervision - Intermittent    Equipment Recommendations  None recommended by OT (Pt refused 3-in1)    Recommendations for Other Services  None at this time    Precautions / Restrictions Precautions Precautions: Fall Restrictions Weight Bearing Restrictions: No    Mobility Bed Mobility Overal bed mobility: Modified Independent General bed mobility comments: Pt found seated in recliner upon OT entering/exiting room  Transfers Overall transfer level: Needs assistance Equipment used: 4-wheeled walker Transfers: Sit to/from Stand Sit to Stand: Supervision         General transfer comment: Supervision secondary to general fatigue    Balance Overall balance assessment: Needs assistance Sitting-balance support: No upper extremity supported;Feet supported Sitting balance-Leahy  Scale: Good     Standing balance support: Bilateral upper extremity supported;During functional activity Standing balance-Leahy Scale: Fair    ADL Overall ADL's : Needs assistance/impaired General ADL Comments: Pt overall supervision with ADLs and functional mobility using rollator. Pt takes seated rest breaks prn and appropriately. Pt on 3 liters of 02 entire session, pt reports this is chronic for her. Pt will benefit from acute OT to reach a mod I level prior to d/c>assisted living.     Vision Additional Comments: Pt reports "good left eye" and "bad right eye". Pt noticeably squints left eye and states this is due to "babying eye". Pt reports she wants to make sure her left eye remains strong. Therapist discussed a patch, pt reports she has tried that before.           Pertinent Vitals/Pain Pain Assessment: No/denies pain Pain Score: 7  Pain Location: back Pain Descriptors / Indicators: Aching Pain Intervention(s): Limited activity within patient's tolerance;RN gave pain meds during session     Hand Dominance Right   Extremity/Trunk Assessment Upper Extremity Assessment Upper Extremity Assessment: Generalized weakness   Lower Extremity Assessment Lower Extremity Assessment: Defer to PT evaluation   Cervical / Trunk Assessment Cervical / Trunk Assessment: Normal   Communication Communication Communication: No difficulties   Cognition Arousal/Alertness: Awake/alert Behavior During Therapy: WFL for tasks assessed/performed Overall Cognitive Status: Within Functional Limits for tasks assessed              Home Living Family/patient expects to be discharged to:: Assisted living Living Arrangements: Alone Available Help at Discharge: Other (Comment) (assistance with IADLs by assisted living staff) Type of Home: Apartment Home Access: Elevator     Home Layout: One level  Home Equipment: Walker - 4 wheels   Additional Comments: Patient will be moving to assisted  living, pt unaware of BR set-up.       Prior Functioning/Environment Level of Independence: Needs assistance  Gait / Transfers Assistance Needed: uses rolling walker outside of apartment but not in apartment    OT Diagnosis: Generalized weakness   OT Problem List: Decreased strength;Decreased activity tolerance;Impaired balance (sitting and/or standing);Decreased safety awareness;Decreased knowledge of use of DME or AE   OT Treatment/Interventions: Self-care/ADL training;Therapeutic exercise;Energy conservation;DME and/or AE instruction;Therapeutic activities;Patient/family education;Balance training    OT Goals(Current goals can be found in the care plan section) Acute Rehab OT Goals Patient Stated Goal: To d/c to ALF OT Goal Formulation: With patient Time For Goal Achievement: 11/26/14 Potential to Achieve Goals: Good ADL Goals Pt Will Perform Grooming: with modified independence;standing Pt Will Perform Upper Body Bathing: Independently;sitting Pt Will Perform Lower Body Bathing: with modified independence;sit to/from stand Pt Will Perform Upper Body Dressing: Independently;sitting Pt Will Perform Lower Body Dressing: with modified independence;sit to/from stand Pt Will Transfer to Toilet: with modified independence;ambulating;bedside commode Pt Will Perform Tub/Shower Transfer: Tub transfer;Shower transfer;shower seat;ambulating;rolling walker;with modified independence Pt/caregiver will Perform Home Exercise Program: Increased strength;Both right and left upper extremity;With theraband;With written HEP provided;With Supervision  OT Frequency: Min 2X/week   Barriers to D/C: Decreased caregiver support   End of Session Equipment Utilized During Treatment: Rolling walker;Oxygen  Activity Tolerance: Patient tolerated treatment well Patient left: in chair;with call bell/phone within reach   Time: 1022-1048 OT Time Calculation (min): 26 min Charges:  OT General Charges $OT  Visit: 1 Procedure OT Evaluation $Initial OT Evaluation Tier I: 1 Procedure OT Treatments $Self Care/Home Management : 8-22 mins G-Codes: OT G-codes **NOT FOR INPATIENT CLASS** Functional Limitation: Self care Self Care Current Status (H1505): At least 1 percent but less than 20 percent impaired, limited or restricted Self Care Goal Status (W9794): At least 1 percent but less than 20 percent impaired, limited or restricted  Ashara Lounsbury , MS, OTR/L, CLT Pager: [phone]  11/12/2014, 11:03 AM

## 2014-11-12 NOTE — Progress Notes (Signed)
Patient ID: [name], female   DOB: Feb 15, 1951, 64 y.o.   MRN: [id number]   Subjective:   Patient ID: [name] female   DOB: [birth year] 64 y.o.   MRN: [id number]  HPI: [name] is a 64 y.o. with a PMHx of COPD on 3L home O2 and IDA 2/2 duodenal bulb ulcer s/p clips presenting to the clinic for a hospital follow up. Patient was discharged from [facility] on 11/09/14 after being admitted for symptomatic anemia with syncope. Her Hgb upon admission was 6.1 and stool FOBT+. EGD done during hospitalization revealed a giant duodenal bulb ulcer and a minor antral ulcer. Stool H pylori antigen negative. Patient received 3 units of PRBCs and 2 runs of IV iron while admitted and Hgb upon discharge was 9.0. She was started on lifelong PPI and oral iron upon discharge. In addition, given Oxycodone for pain and Zofran for nausea. Today patient states she has been feeling much worse since she left the hospital. Reports feeling weak, dizzy, and tired. States she is still nauseous and has abdominal pain but has not vomited. Denies having any hematemesis, hematochezia, or melena. States she has not had a BM for the past 1 week but is still passing gas. Denies ever having any symptoms of acid reflux. However, does admit to chronic NSAID use. Patient was accompanied by her sister who reports the patient has been walking very slow and still eating but not as much. Patient's sister states she is visiting from [facility] to help take care of the patient and wishes for the patient to go to [facility] as she is too weak to take care of herself at home.      Past Medical History  Diagnosis Date  . Depression   . Suicidal ideation 2007     attempted overdose 2012  Dr. Tivis Ringer report  . Bipolar disorder   . Substance abuse      narcotics, alcohol, tobacco  . Chronic pain syndrome     follows at [facility]  . DEFICIENCY, VITAMIN D NOS 01/03/2007  . Chronic lower back pain   .  Anxiety   . OSTEOPOROSIS 06/17/2009    DEXA 05/2009 : L femur -2.9; R femur -2.5. Alendronate on med list but not taking. Needs addressed ASAP as h/o fractures.    . Elevated liver function tests   . On home oxygen therapy     "3L; 24/7" (8/30//2016)  . Shortness of breath     "all the time" (02/27/2013)  . 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 Sciatic pain   . Anemia   . Pneumonia X 1?  . Hyperthyroidism     "borderline"  . History of blood transfusion 03/2014; 10/2014    "low HgB" (11/11/2014)  . Duodenal ulcer, chronic   . Arthritis     "all in my body, primarily left hip" (11/11/2014)   No current facility-administered medications for this visit.   No current outpatient prescriptions on file.   Facility-Administered Medications Ordered in Other Visits  Medication Dose Route Frequency Provider Last Rate Last Dose  . 0.9 %  sodium chloride infusion  250 mL Intravenous PRN Lucious Groves, DO      . 0.9 %  sodium chloride infusion   Intravenous Continuous Tasrif Ahmed, MD      . budesonide (PULMICORT) nebulizer solution 0.5 mg  0.5 mg Nebulization BID Zada Finders, MD      .  gabapentin (NEURONTIN) capsule 300 mg  300 mg Oral TID Lucious Groves, DO   300 mg at 11/12/14 1017  . Influenza vac split quadrivalent PF (FLUARIX) injection 0.5 mL  0.5 mL Intramuscular Tomorrow-1000 Axel Filler, MD   0.5 mL at 11/12/14 1000  . ipratropium-albuterol (DUONEB) 0.5-2.5 (3) MG/3ML nebulizer solution 3 mL  3 mL Nebulization QID Axel Filler, MD      . lamoTRIgine (LAMICTAL) tablet 100 mg  100 mg Oral Daily Lucious Groves, DO   100 mg at 11/12/14 1016  . mometasone-formoterol (DULERA) 100-5 MCG/ACT inhaler 2 puff  2 puff Inhalation BID Lucious Groves, DO   2 puff at 11/12/14 [phone]  . oxyCODONE (Oxy IR/ROXICODONE) immediate release tablet 5 mg  5 mg Oral Q4H PRN Lucious Groves, DO   5 mg at 11/12/14 0901  . pantoprazole (PROTONIX) EC  tablet 40 mg  40 mg Oral BID Lucious Groves, DO   40 mg at 11/12/14 1017  . senna (SENOKOT) tablet 17.2 mg  2 tablet Oral TID Zada Finders, MD   17.2 mg at 11/12/14 1017  . sodium chloride 0.9 % bolus 500 mL  500 mL Intravenous Once Tasrif Ahmed, MD      . sodium chloride 0.9 % injection 3 mL  3 mL Intravenous Q12H Lucious Groves, DO   3 mL at 11/11/14 1545  . sodium chloride 0.9 % injection 3 mL  3 mL Intravenous Q12H Lucious Groves, DO   3 mL at 11/11/14 2204  . sodium chloride 0.9 % injection 3 mL  3 mL Intravenous PRN Lucious Groves, DO       Family History  Problem Relation Age of Onset  . Stroke Neg Hx   . Cancer Neg Hx   . Myelodysplastic syndrome Father     Died from myelofibrosis though diagnosed post-mortem   Social History   Social History  . Marital Status: Divorced    Spouse Name: N/A  . Number of Children: 2  . Years of Education: N/A   Occupational History  . retired    Social History Main Topics  . Smoking status: Current Every Day Smoker -- 0.20 packs/day for 45 years    Types: Cigarettes  . Smokeless tobacco: Never Used     Comment: 11/11/2014 ""smoking 1 cigarettes/day; that's down from 2 ppd"  . Alcohol Use: 0.0 oz/week    0 Standard drinks or equivalent per week     Comment: 11/11/2014 "nothing in 7 years; never had a problem w/it"  . Drug Use: No  . Sexual Activity: No   Other Topics Concern  . 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  HENT: Negative for ear pain.   Eyes: Negative for blurred vision and pain.  Respiratory: Negative for cough, shortness of breath and wheezing.   Cardiovascular: Negative for chest pain, palpitations and leg swelling.    Gastrointestinal: Positive for nausea, abdominal pain and constipation. Negative for vomiting, diarrhea, blood in stool and melena.  Genitourinary: Negative for dysuria, urgency and frequency.  Skin: Negative for itching and rash.  Neurological: Positive for dizziness and weakness. Negative for headaches.     Objective:  Physical Exam: Filed Vitals:   11/11/14 0908 11/11/14 1104 11/11/14 1105  BP: 117/66 120/70 120/60  Pulse: 68 70 60  Temp: 98 F (36.7 C)  98.5 F (36.9 C)  TempSrc: Oral  Oral  Height: 5\' 1"  (1.549 m)  5\' 1"  (1.549 m)  Weight: 91 lb (41.277 kg)  91 lb (41.277 kg)  SpO2: 98%  98%   Physical Exam  Constitutional: She is oriented to person, place, and time. She appears well-developed and well-nourished. She appears distressed.  Patient was lying down on the examination table and stated she does not feel good sitting up in a chair.   HENT:  Head: Normocephalic and atraumatic.  Eyes: EOM are normal. Pupils are equal, round, and reactive to light.  Neck: Neck supple. No tracheal deviation present.  Cardiovascular: Normal rate, regular rhythm and intact distal pulses.   Pulmonary/Chest: Effort normal. No respiratory distress. She has no wheezes. She has no rales.  Abdominal: Soft. She exhibits no distension. There is tenderness.  Hyperactive bowel sounds  Diffuse mild abdominal tenderness on palpation. No rebound tenderness, no guarding, no rigidity.   Musculoskeletal: She exhibits no edema.  Neurological: She is alert and oriented to person, place, and time.  Skin: Skin is warm and dry. She is not diaphoretic.  Decreased skin turgor     Assessment & Plan:

## 2014-11-12 NOTE — Assessment & Plan Note (Addendum)
Patient recently discharged from [facility] after being admitted for symptomatic IDA 2/2 duodenal bulb ulcer s/p clips. Patient admits to chronic NSAID use and that could explain her gastritis. Hgb upon discharge was 9.0. Her symptoms of diffuse abdominal pain, nausea, no BM in 1 week are concerning for SBO. Patient has also been feeling weak, tired which is concerning for continued blood loss from perforated ulcer vs clip failure. Physical exam showing hyperactive bowel sounds and mild diffuse abdominal tenderness. Vitals are stable BP 117/66 and P68. However, patient does not look well. She is lying down on the examination table and not able to sit up on a chair. Orthostatic vitals were positive - diastolic BP decreased by 20 points going from lying down to standing position. Physical exam also revealed decreased skin turgor which could be due to dehydration from reduced oral intake.   She has been admitted to the hospital for further workup and management.   Labs/ studies ordered in clinic: -stat CBC -BMP -Abdominal x-ray (KUB) to look for free air in the abdomen  Results: -CBC showing Hgb 11.2, improved from 9.0 on discharge. This makes acute blood loss from gastric ulcer perforation or clip failure less likely. Her symptoms could likely be due to dehydration from decreased oral intake.  -Abdominal x-ray showing non-obstructive bowel gas pattern. Patient was in supine position and as such presence/ absence of air fluid level was suboptimally evaluated.

## 2014-11-12 NOTE — Clinical Social Work Note (Signed)
Clinical Social Worker received referral for possible ST-SNF vs. ALF placement.  Chart reviewed.  PT/OT recommending home with home health at ALF.  CSW spoke with patient sister, Stanton Kidney over the phone who states that the ALF process was started from the clinic yesterday.  CSW spoke with clinic CSW who plans to continue process with patient and family for ALF placement.  Patient sister agreeable with plan and will notify clinic CSW at the time of patient discharge.  CSW updated MD regarding patient plans to return home with [facility] and pursue ALF placement from home through clinic CSW.  Spoke with RN Case Manager who will follow up with patient to discuss home health needs.    CSW signing off - please re consult if social work needs arise.  Barbette Or, [place]

## 2014-11-12 NOTE — Progress Notes (Signed)
   Subjective: Patient states that she is feeling better and had a BM last night that looked normal. She does state she is having some continued abdominal pain. Otherwise feeling well and no other changes overnight. Objective: Vital signs in last 24 hours: Filed Vitals:   11/12/14 0812 11/12/14 0821 11/12/14 0930 11/12/14 1231  BP:      Pulse:  66    Temp:      TempSrc:      Resp:  18    Height:      Weight:      SpO2: 98% 96% 98% 98%   Weight change:   Intake/Output Summary (Last 24 hours) at 11/12/14 1318 Last data filed at 11/12/14 1018  Gross per 24 hour  Intake    123 ml  Output   1252 ml  Net  -1129 ml   General: resting in chair, eating Cardiac: RRR, no rubs, murmurs or gallops Pulm: clear lung sounds Abd: soft, mildly tender, nondistended, BS present Ext: warm and well perfused, no pedal edema   Assessment/Plan: Active Problems:   Orthostatic hypotension  Othostatic hypotension: Patient was borderline orthostatic this morning after receiving IV fluids overnight. Patient walked with PT today and feels improved concerning her lightheadedness. -Continue IV NS 100 mL for 15 hours -Repeat orthostatics, pulse ox with ambulation -Continue Senakot 2 tabs q8h for bowel management -Pantoprazole 40 mg BID     Dispo: Disposition is deferred at this time, awaiting improvement of current medical problems.  Anticipated discharge in approximately 1 day(s).  -Spoke to Clinical Social Worker who states that patient will need to return home before acceptance to ALF at [facility] rather than straight from inpatient. Patient's sister will be coming tomorrow to stay for several days and help with [name]'s care at home. PT/OT has recommended home health nursing which will be helpful during patient's time at home before acceptance to ALF.     The patient does have a current PCP (Rushil Sherrye Payor, MD) and does need an [facility] hospital follow-up appointment after discharge.  The  patient does have transportation limitations that hinder transportation to clinic appointments.  .  LOS: 1 day   Zada Finders, MD 11/12/2014, 1:18 PM

## 2014-11-12 NOTE — Progress Notes (Signed)
Pt ambulated in hall without O2. O2 sat range 94-100%

## 2014-11-12 NOTE — Care Management Note (Signed)
Case Management Note  Patient Details  Name: [name] MRN: [id number] Date of Birth: Jun 05, 1950  Subjective/Objective:                 Patient currently lives at [facility], has a Corporate investment banker, and get home oxygen through [place]. She is getting ready to move to [place]. She denies any other CM resources.   Action/Plan:  Will continue to follow and offer resources as needed.   Expected Discharge Date:                  Expected Discharge Plan:  Home/Self Care  In-House Referral:     Discharge planning Services  CM Consult  Post Acute Care Choice:    Choice offered to:     DME Arranged:    DME Agency:     HH Arranged:    HH Agency:     Status of Service:  In process, will continue to follow  Medicare Important Message Given:    Date Medicare IM Given:    Medicare IM give by:    Date Additional Medicare IM Given:    Additional Medicare Important Message give by:     If discussed at [place] of Stay Meetings, dates discussed:    Additional Comments:  Carles Collet, RN 11/12/2014, 11:09 AM

## 2014-11-13 ENCOUNTER — Telehealth: Payer: Self-pay | Admitting: Licensed Clinical Social Worker

## 2014-11-13 DIAGNOSIS — F319 Bipolar disorder, unspecified: Secondary | ICD-10-CM | POA: Diagnosis not present

## 2014-11-13 DIAGNOSIS — J449 Chronic obstructive pulmonary disease, unspecified: Secondary | ICD-10-CM | POA: Diagnosis not present

## 2014-11-13 DIAGNOSIS — D509 Iron deficiency anemia, unspecified: Secondary | ICD-10-CM | POA: Diagnosis not present

## 2014-11-13 DIAGNOSIS — Z7951 Long term (current) use of inhaled steroids: Secondary | ICD-10-CM | POA: Diagnosis not present

## 2014-11-13 DIAGNOSIS — E861 Hypovolemia: Secondary | ICD-10-CM | POA: Diagnosis not present

## 2014-11-13 DIAGNOSIS — K5900 Constipation, unspecified: Secondary | ICD-10-CM | POA: Diagnosis not present

## 2014-11-13 DIAGNOSIS — F17210 Nicotine dependence, cigarettes, uncomplicated: Secondary | ICD-10-CM | POA: Diagnosis not present

## 2014-11-13 DIAGNOSIS — I951 Orthostatic hypotension: Secondary | ICD-10-CM | POA: Diagnosis not present

## 2014-11-13 DIAGNOSIS — Z9981 Dependence on supplemental oxygen: Secondary | ICD-10-CM | POA: Diagnosis not present

## 2014-11-13 MED ORDER — POLYETHYLENE GLYCOL 3350 17 G PO PACK
17.0000 g | PACK | Freq: Every day | ORAL | Status: DC
  Administered 2014-11-13: 17 g via ORAL

## 2014-11-13 MED ORDER — SENNA 8.6 MG PO TABS: 2.0000 | ORAL_TABLET | Freq: Three times a day (TID) | ORAL | Status: DC | PRN

## 2014-11-13 NOTE — Progress Notes (Signed)
Patient discharged to home, education and discharge instructions reviewed and all questions addressed. IV and telemetry dc'd. Vitals stable for pt. Pt leaving with sister/ 11/13/2014 2:55 PM [name]

## 2014-11-13 NOTE — Progress Notes (Signed)
   Subjective: Patient reports feeling well, wanting to go home.  Had a bowel movement night before last that was reported to look normal.  Still with some continued abdominal pain but otherwise doing well with no changes overnight.  Objective: Vital signs in last 24 hours: Filed Vitals:   11/13/14 0536 11/13/14 0924 11/13/14 1010 11/13/14 1012  BP: 144/59  143/51 138/55  Pulse: 63   67  Temp: 99.2 F (37.3 C)     TempSrc: Oral     Resp: 20     Height:      Weight: 96 lb 1.9 oz (43.6 kg)     SpO2: 98% 98%  96%   Weight change: 3 lb 8.3 oz (1.597 kg)  Intake/Output Summary (Last 24 hours) at 11/13/14 1321 Last data filed at 11/13/14 1105  Gross per 24 hour  Intake    598 ml  Output   1250 ml  Net   -652 ml   Physical Exam: General: Alert, cooperative, NAD, sitting up in a chair. Neck: Full range of motion without pain Lungs: Clear lung sounds, on oxygen via [place] Heart: RRR, no murmurs, gallops, or rubs Abdomen: Soft, non-distended mildly tender to palpation, positive bowel sounds Extremities: No cyanosis, clubbing, or edema  Lab Results: Basic Metabolic Panel:  Recent Labs Lab 11/07/14 0619 11/11/14 1120  NA 140 142  K 4.1 3.9  CL 100* 100*  CO2 31 32  GLUCOSE 89 89  BUN <5* 8  CREATININE <0.30* 0.41*  CALCIUM 7.6* 8.1*    Assessment/Plan: Active Problems:   Orthostatic hypotension   Constipation  Orthostatic hypotension -orthostatic vitals from this morning without orthostatic hypotension -patient ambulated in hall without O2 and maintained O2 sats in the range of 94-100%, no symptoms of dizziness, lightheadedness -no longer on IV fluids for plan to discharge today -protonix 40mg  bid  Constipation -has not had bowel movement since night before last -Senokot tid -Miralax daily starting today. -will discharge with Senokot.       Dispo: Patient will return home before acceptance to ALF at [facility].  Patients sister will be coming today to stay for  several days and help with care at home.  PT/OT recommends home health nursing.  The patient does have a current PCP (Rushil Sherrye Payor, MD) and does need an [facility] hospital follow-up appointment after discharge.  The patient does have transportation limitations that hinder transportation to clinic appointments.  .Services Needed at time of discharge: Y = Yes, Blank = No PT:   OT:   RN:   Equipment:   Other:     LOS: 2 days   Jule Ser, DO 11/13/2014, 1:21 PM

## 2014-11-13 NOTE — Care Management Note (Signed)
Case Management Note  Patient Details  Name: [name] MRN: [id number] Date of Birth: 01/24/1951  Subjective/Objective:                 Patient currently lives at [facility], has a Corporate investment banker, and get home oxygen through [place]. She is getting ready to move to [place]. She denies any other CM resources.   Tim with [facility] notified of referral for [facility] and discharge today/ tomorrow.    Action/Plan:   Expected Discharge Date:                  Expected Discharge Plan:  [place]  In-House Referral:     Discharge planning Services  CM Consult  Post Acute Care Choice:  Home Health Choice offered to:  Patient  DME Arranged:    DME Agency:     HH Arranged:  PT HH Agency:  [place]  Status of Service:  Completed, signed off  Medicare Important Message Given:    Date Medicare IM Given:    Medicare IM give by:    Date Additional Medicare IM Given:    Additional Medicare Important Message give by:     If discussed at [place] of Stay Meetings, dates discussed:    Additional Comments:  Carles Collet, RN 11/13/2014, 11:26 AM

## 2014-11-13 NOTE — Telephone Encounter (Signed)
[name], sister of pt, place call to this worker.  Sister states she is on her way back from [facility] and is inquiring if pt would be able to be admitted to ALF this weekend.  CSW informed Ms. Wilson, awaiting pt to be discharged to complete FL-2 to obtain most recent medication list. Initiated PASRR, pt's information is attached to incorrect spelling of last name "Sirek", Must ID [id number].  Awaiting discharge form hospitalization.

## 2014-11-13 NOTE — Progress Notes (Signed)
Patient seen and examined. Case d/w residents in detail. I have reviewed the resident note and I agree with findings and plan as documented in Dr. Alcario Drought note.  Patient was admitted for orthostatic hypotension and abdominal pain secondary to constipation. Still with mild abd pain but improved. Her orthostasis has resolved with IVF. She has had 1 BM while in the hospital. Will continue with senekot and give miralax today as well. She is stable for discharge home today and will be picked up by her sister. She will be placed in ALF at [facility] as an outpatient.

## 2014-11-13 NOTE — Progress Notes (Signed)
Occupational Therapy Treatment Patient Details Name: [name] MRN: [id number] DOB: [birth year] Today's Date: 11/13/2014    History of present illness Patient is a 64 year old woman who was recently admitted for acute blood loss anemia due to upper GI bleed caused by a large peptic ulcer. This ulcer was the result of long-term nonsteroidal anti-inflammatory use. During her EGD a clip was deployed, and no residual bleeding was seen. She did fairly well during the hospitalization and was discharged on 8/27 to home. She was brought into the clinic today by her sister because she was not feeling well. Having increasing fatigue and tiredness throughout the day. Also having worsening diffuse abdominal pain which she describes as moderate in intensity   OT comments  Pt up to [facility] across the room for OT session. She declined transferring into the bathroom this session due to pain and feeling lightheaded. BP sitting 143/51 and BP standing 138/55. Feel HHOT would be helpful as well as an aide (pt states she already has an aide at home) to help with increasing independence and safety.    Follow Up Recommendations  Home health OT;Supervision/Assistance - 24 hour    Equipment Recommendations  Other (comment) (pt refuses 3in1)    Recommendations for Other Services      Precautions / Restrictions Precautions Precautions: Fall Restrictions Weight Bearing Restrictions: No       Mobility Bed Mobility               General bed mobility comments: in recliner when OT arrived.  Transfers Overall transfer level: Needs assistance Equipment used: None Transfers: Sit to/from Stand Sit to Stand: Min guard              Balance     Sitting balance-Leahy Scale: Good       Standing balance-Leahy Scale: Fair                     ADL       Grooming: Set up;Sitting                   Toilet Transfer: Min guard;Ambulation;BSC   Toileting- Water quality scientist and  Hygiene: Min guard;Sit to/from stand         General ADL Comments: Pt declined use of rollator to transfer across the room from the recliner to the [facility]. She states she feels a little lightheaded when up and she didnt want to walk all the way into the bathroom right now. BP sitting 143/51. BP standing 138/55. pt states pain 10/10 in left hip. She declines 3in1 for use at home and states, "I will be going to AL hopefully this weekend."       Vision                     Perception     Praxis      Cognition   Behavior During Therapy: [facility] for tasks assessed/performed Overall Cognitive Status: Within Functional Limits for tasks assessed                       Extremity/Trunk Assessment               Exercises     Shoulder Instructions       General Comments      Pertinent Vitals/ Pain       Pain Assessment: 0-10 Pain Score: 10-Worst pain ever Pain Location: back and L hip Pain  Descriptors / Indicators: Aching Pain Intervention(s): Repositioned;Limited activity within patient's tolerance;Other (comment) (pt states she has had what she can have for pain.)  Home Living                                          Prior Functioning/Environment              Frequency Min 2X/week     Progress Toward Goals  OT Goals(current goals can now be found in the care plan section)  Progress towards OT goals: Progressing toward goals     Plan Discharge plan needs to be updated    Co-evaluation                 End of Session Equipment Utilized During Treatment: Oxygen   Activity Tolerance Patient limited by pain;Other (comment) (states also somewhat lightheaded.)   Patient Left in chair;with call bell/phone within reach;with chair alarm set   Nurse Communication          Time: [phone] OT Time Calculation (min): 13 min  Charges: OT General Charges $OT Visit: 1 Procedure OT Treatments $Therapeutic Activity: 8-22  mins  Jules Schick  [phone] 11/13/2014, 10:33 AM

## 2014-11-13 NOTE — Progress Notes (Signed)
Notified [place] liaison of discharge today, and [facility] PT order.

## 2014-11-13 NOTE — Progress Notes (Signed)
Late entry for missed G-code. Based on review of the evaluation and goals by Theodoro Grist, PT.   11/30/2014 0954  PT G-Codes **NOT FOR INPATIENT CLASS**  Functional Assessment Tool Used Clinical judgment based on review of medical recird  Functional Limitation Mobility: Walking and moving around  Mobility: Walking and Moving Around Current Status (H6067) CI  Mobility: Walking and Moving Around Goal Status [phone]) CI  Lavonia Dana, PT  [phone] 11/13/2014

## 2014-11-14 NOTE — Progress Notes (Signed)
I saw and evaluated the patient.  I personally confirmed the key portions of Dr. Elon Jester history and exam and reviewed pertinent patient test results.  The assessment, diagnosis, and plan were formulated together and I agree with the documentation in the resident's note.  Upon my exam [name] appeared acutely ill in mild distress secondary to abdominal pain.  Her abdomen was diffusely tender to palpation but it was soft and there was no guarding or rebound.  Bowel sounds were active and I did not appreciate high pitched sounds.  With the decreased oral intake and diastolic orthostatic hypotension she was admitted for IV hydration until her oral intake improves to sustain her hydration needs.

## 2014-11-14 NOTE — Discharge Summary (Signed)
Name: [name] MRN: [id number] DOB: April 21, 1950 64 y.o. PCP: Riccardo Dubin, MD  Date of Admission: 11/11/2014 12:33 PM Date of Discharge: 11/14/2014 Attending Physician: No att. providers found  Discharge Diagnosis: 1. Orthostatic hypotension 2. Constipation Active Problems:   Orthostatic hypotension   Constipation  Discharge Medications:   Medication List    STOP taking these medications        budesonide 0.5 MG/2ML nebulizer solution  Commonly known as:  PULMICORT     ferrous sulfate 325 (65 FE) MG tablet      TAKE these medications        alendronate 70 MG tablet  Commonly known as:  FOSAMAX  Take 1 tablet (70 mg total) by mouth once a week. Take with a full glass of water on an empty stomach.     fluticasone 50 MCG/ACT nasal spray  Commonly known as:  FLONASE  Place 1 spray into both nostrils daily.     Fluticasone-Salmeterol 250-50 MCG/DOSE Aepb  Commonly known as:  ADVAIR DISKUS  INHALE 1 PUFF INTO THE LUNGS 2 TIMES DAILY     gabapentin 300 MG capsule  Commonly known as:  NEURONTIN  Take 300 mg by mouth 3 (three) times daily.     ipratropium-albuterol 0.5-2.5 (3) MG/3ML Soln  Commonly known as:  DUONEB  4 times a day (breakfast, lunch, dinner, bedtime).Can take 2 additional treatments if needed on bad days. DX J43.8     lamoTRIgine 100 MG tablet  Commonly known as:  LAMICTAL  TAKE ONE TABLET EVERY DAY     loratadine 10 MG tablet  Commonly known as:  CLARITIN  Take 10 mg by mouth daily as needed for allergies (seasonal).     Oxycodone HCl 10 MG Tabs  Take 10 mg by mouth 2 (two) times daily.     pantoprazole 40 MG tablet  Commonly known as:  PROTONIX  Take 1 tablet (40 mg total) by mouth 2 (two) times daily.     polyethylene glycol powder powder  Commonly known as:  GLYCOLAX/MIRALAX  TAKE 17G DISSOLVED IN 8OZ WATER DAILY ASNEEDED FOR MODERATE CONSTIPATION     PROAIR HFA 108 (90 BASE) MCG/ACT inhaler  Generic drug:  albuterol  INHALE ONE  PUFF BY MOUTH EVERY 2 TO 4 HOURS AS NEEDED FOR WHEEZING     senna 8.6 MG Tabs tablet  Commonly known as:  SENOKOT  Take 2 tablets (17.2 mg total) by mouth 3 (three) times daily as needed for mild constipation.     Vitamin D (Ergocalciferol) 50000 UNITS Caps capsule  Commonly known as:  DRISDOL  Take 1 capsule (50,000 Units total) by mouth every 7 (seven) days.        Disposition and follow-up:   [name] was discharged from [facility] in Stable condition.  At the hospital follow up visit please address:  1.  Please arrange for CT colonscopy (if not already arranged) - per GI - will need prior authorization as cannot do inpatient, order placed per Dr. Odette Horns Discharge Summary from 10/27/2014.   2.  Labs / imaging needed at time of follow-up: CBC  3.  Pending labs/ test needing follow-up: CT colonoscopy  Follow-up Appointments:     Follow-up Information    Follow up with [facility].   Why:  [place] [facility]. Will call in next 24 to 48 hours to set up home health visit.   Contact information:   [place]  [address] [place] [address] [phone]       Follow up with Jenetta Downer, MD On 11/20/2014.   Specialty:  Internal Medicine   Why:  @ 10:45am   Contact information:   [place] [address] [phone]       Procedures Performed:  Ct Head Wo Contrast  11/04/2014   CLINICAL DATA:  Patient with syncopal episode  EXAM: CT HEAD WITHOUT CONTRAST  TECHNIQUE: Contiguous axial images were obtained from the base of the skull through the vertex without intravenous contrast.  COMPARISON:  Brain CT 11/04/2014  FINDINGS: Ventricles and sulci are appropriate for patient's age. No evidence for acute cortically based infarct, intracranial hemorrhage, mass lesion or mass-effect. Orbits are unremarkable. Paranasal sinuses are well aerated. Mastoid air cells are unremarkable. Calvarium is intact.  IMPRESSION: No acute intracranial  process.   Electronically Signed   By: Lovey Newcomer M.D.   On: 11/04/2014 14:29   Dg Chest Portable 1 View  10/26/2014   CLINICAL DATA:  Increasing weakness and fatigue  EXAM: PORTABLE CHEST - 1 VIEW  COMPARISON:  08/05/2014  FINDINGS: The heart size and mediastinal contours are within normal limits. Both lungs are clear. The visualized skeletal structures are unremarkable.  IMPRESSION: No active disease.   Electronically Signed   By: Inez Catalina M.D.   On: 10/26/2014 18:14   Dg Abd Portable 1v  11/11/2014   CLINICAL DATA:  Constipation for 1 week  EXAM: PORTABLE ABDOMEN - 1 VIEW  COMPARISON:  CT 11/04/2014  FINDINGS: Mild stool burden. No dilated loop of bowel. S shaped thoracolumbar scoliosis partly visualized. Presence or absence of air-fluid levels or free air is suboptimally evaluated on this supine projection. Presumed clothing radiopaque artifact noted over the abdomen.  IMPRESSION: Nonobstructive bowel gas pattern.  Mild stool burden.   Electronically Signed   By: Conchita Paris M.D.   On: 11/11/2014 17:52   Ct Angio Abd/pel W/ And/or W/o  11/04/2014   CLINICAL DATA:  Syncope. Dropping hemoglobin. Abdominal pain. Concern for AAA or mesenteric ischemia.  EXAM: CTA ABDOMEN AND PELVIS wITHOUT AND WITH CONTRAST  TECHNIQUE: Multidetector CT imaging of the abdomen and pelvis was performed using the standard protocol during bolus administration of intravenous contrast. Multiplanar reconstructed images and MIPs were obtained and reviewed to evaluate the vascular anatomy.  CONTRAST:  1104mL OMNIPAQUE IOHEXOL 350 MG/ML SOLN  COMPARISON:  08/05/2014  FINDINGS: Bilateral breast implants partially imaged. Lung bases are clear. Heart is normal size. Severe emphysematous changes noted in the lung bases. No effusions.  Aorta and iliac vessels are normal caliber with scattered atherosclerotic calcifications. No dissection. Mesenteric vessels are widely patent. Calcified plaque at the origin of the renal arteries  bilaterally without visible significant stenosis.  Liver, gallbladder, spleen, pancreas, adrenals and kidneys are unremarkable. Uterus, adnexae a and urinary bladder unremarkable.  Moderate stool within the colon.  Nonobstructive bowel gas pattern.  There is abnormal wall thickening in the region of the distal stomach and proximal duodenum, low likely gastritis and duodenitis. Cannot exclude peptic ulcer disease. Small amount of free fluid in the pelvis. No free air or adenopathy.  No acute bony abnormality. Degenerative facet disease and grade 1 anterolisthesis at L4-5.  Review of the MIP images confirms the above findings.  IMPRESSION: Wall thickening in the distal stomach and proximal duodenum suggesting gastritis/ duodenitis.  No evidence of aortic aneurysm or dissection. No findings to suggest bowel ischemia.  Moderate stool burden throughout the colon.  Severe emphysematous changes noted in the lung bases.   Electronically Signed   By: Rolm Baptise M.D.   On: 11/04/2014 14:28    Admission HPI: [name] is a 64 year old lady with PMH of COPD requiring 3 liters of home oxygen, iron deficiency anemia 2/2 to duodenal bulb ulcer s/p clips, and tobacco abuse who presented to the Internal Medicine Clinic for a hospital follow up visit with complaints of abdominal pain, nausea, and constipation. She was discharged from [facility] on 11/09/2014 after being admitted for symptomatic anemia with syncope. During hospital stay, she was found to have a giant duodenal bulb ulcer (which was clipped during EGD) as well as a minor antral ulcer likely due to NSAID use. Clotest (H. Phylori) biopsy was negative for urease. Patient received 3 Units PRBCs and 2 runs of IV iron while admitted which improved her Hgb levels and symptoms. She was started on lifelong PPI and oral iron on discharge.  Patient states that she continued to have abdominal pain when she left the hospital which she cannot  associate with any inciting factor such as meals or positional change. Although she reports nausea, she did not have any episodes of emesis and was able to keep food and drinks down, but does admit to a decreased appetite. Since this time, she has not had a single bowel movement. She reports being able to urinate regularly. She reports occasional chills, but denies fever, diarrhea, dysuria, hematuria, emesis, or hematochezia. She was found to have orthostatic hypotension while in clinic. She is accompanied by her sister who is visiting from [place] to help with patient's care at home and applying for enrollment in [facility].  Hospital Course by problem list: Active Problems:   Orthostatic hypotension   Constipation  1. Orthostatic hypotension: Patient was admitted for orthostatic hypotension who had recently been discharged for acute blood loss anemia due to upper GI bleed caused by a large peptic ulcer that was due to long-term NSAID use.  During her EGD, a clip was deployed and no residual bleeding was seen.  She did well and was discharged on 8/27 to home.  On 8/30, she was brought into the [facility] by her sister because she was not feeling well.  She was having fatigue, abdominal pain, and reported a 4-day history of not having a bowel movement.  She did not have any hematemesis, but endorsed decreased appetite.  She was found to have orthostatic hypotension in the clinic and admitted to the hospital.  She received IVF overnight, abdominal x-ray to evaluate stool burden or impaction, given Senokot q8hours, with a recheck of her Hgb and orthostatics.  She felt better the following day with IV hydration and successful bowel movement.  Her hemoglobin was stable, and had no recurrent signs of GI bleeding.  However, she remained mildly orthostatic with some residual abdominal pain.  She was continued on IV hydration for one more day with resolution of her orthostatics  and discharged to home with plan to go to assisted living facility from home.  2.  Constipation: as above.  Discharge Vitals:   BP 138/55 mmHg  Pulse 67  Temp(Src) 99.2 F (37.3 C) (Oral)  Resp 20  Ht 5\' 1"  (1.549 m)  Wt 96 lb 1.9 oz (43.6 kg)  BMI 18.17 kg/m2  SpO2 96%  Discharge Labs:  No results found for this or any previous visit (from the past 24 hour(s)).  Signed: Jule Ser, DO  11/14/2014, 7:39 PM    Services Ordered on Discharge: home health PT Equipment Ordered on Discharge: none

## 2014-11-19 ENCOUNTER — Telehealth: Payer: Self-pay | Admitting: Internal Medicine

## 2014-11-19 NOTE — Telephone Encounter (Signed)
Call to patient to confirm appointment for 11/20/14 at 10:45 lmtcb

## 2014-11-19 NOTE — Telephone Encounter (Signed)
[facility] completed and signed.  CSW faxed to [place] DSS and facility.  PASRR review requested additional information.  Information sent and awaiting nurse review.

## 2014-11-20 ENCOUNTER — Ambulatory Visit: Payer: Self-pay | Admitting: Internal Medicine

## 2014-11-20 NOTE — Telephone Encounter (Signed)
No response back from PASRR screen as of yet.

## 2014-11-21 ENCOUNTER — Ambulatory Visit (INDEPENDENT_AMBULATORY_CARE_PROVIDER_SITE_OTHER): Payer: Medicare Other | Admitting: Internal Medicine

## 2014-11-21 ENCOUNTER — Encounter: Payer: Self-pay | Admitting: Internal Medicine

## 2014-11-21 VITALS — BP 150/69 | HR 70 | Temp 98.1°F | Ht 61.0 in

## 2014-11-21 DIAGNOSIS — K269 Duodenal ulcer, unspecified as acute or chronic, without hemorrhage or perforation: Secondary | ICD-10-CM | POA: Diagnosis not present

## 2014-11-21 DIAGNOSIS — J449 Chronic obstructive pulmonary disease, unspecified: Secondary | ICD-10-CM | POA: Diagnosis not present

## 2014-11-21 DIAGNOSIS — J439 Emphysema, unspecified: Secondary | ICD-10-CM

## 2014-11-21 DIAGNOSIS — K253 Acute gastric ulcer without hemorrhage or perforation: Secondary | ICD-10-CM | POA: Diagnosis not present

## 2014-11-21 DIAGNOSIS — Z0000 Encounter for general adult medical examination without abnormal findings: Secondary | ICD-10-CM

## 2014-11-21 DIAGNOSIS — Z9981 Dependence on supplemental oxygen: Secondary | ICD-10-CM | POA: Diagnosis not present

## 2014-11-21 MED ORDER — TRAMADOL HCL 50 MG PO TABS: 50.0000 mg | ORAL_TABLET | Freq: Four times a day (QID) | ORAL | Status: DC | PRN

## 2014-11-21 MED ORDER — ONDANSETRON 4 MG PO TBDP: 4.0000 mg | ORAL_TABLET | Freq: Three times a day (TID) | ORAL | Status: DC | PRN

## 2014-11-21 NOTE — Progress Notes (Signed)
Patient ID: [name], female   DOB: 03-28-1950, 64 y.o.   MRN: [id number]   Subjective:   Patient ID: [name] female   DOB: Jun 27, 1950 64 y.o.   MRN: [id number]  HPI: [name] is a 64 y.o. with PMH listed below, presented today for Hopsital follow up- admitted- 8/23- 8/28 and again from 8/30- 9/2- Managed for Duodenal ulcer, with symptomatic anemia  and Orthostatic hypotension respectively on both admissions.   Past Medical History  Diagnosis Date  . Depression   . Suicidal ideation 2007     attempted overdose 2012  Dr. Tivis Ringer report  . Bipolar disorder   . Substance abuse      narcotics, alcohol, tobacco  . Chronic pain syndrome     follows at [facility]  . DEFICIENCY, VITAMIN D NOS 01/03/2007  . Chronic lower back pain   . Anxiety   . OSTEOPOROSIS 06/17/2009    DEXA 05/2009 : L femur -2.9; R femur -2.5. Alendronate on med list but not taking. Needs addressed ASAP as h/o fractures.    . Elevated liver function tests   . On home oxygen therapy     "3L; 24/7" (8/30//2016)  . Shortness of breath     "all the time" (02/27/2013)  . 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 Sciatic pain   . Anemia   . Pneumonia X 1?  . Hyperthyroidism     "borderline"  . History of blood transfusion 03/2014; 10/2014    "low HgB" (11/11/2014)  . Duodenal ulcer, chronic   . Arthritis     "all in my body, primarily left hip" (11/11/2014)   Current Outpatient Prescriptions  Medication Sig Dispense Refill  . alendronate (FOSAMAX) 70 MG tablet Take 1 tablet (70 mg total) by mouth once a week. Take with a full glass of water on an empty stomach. (Patient taking differently: Take 70 mg by mouth every Sunday. Take with a full glass of water on an empty stomach.) 4 tablet 0  . fluticasone (FLONASE) 50 MCG/ACT nasal spray Place 1 spray into both nostrils daily. 16 g 1  . Fluticasone-Salmeterol (ADVAIR DISKUS) 250-50 MCG/DOSE  AEPB INHALE 1 PUFF INTO THE LUNGS 2 TIMES DAILY 5 each 6  . gabapentin (NEURONTIN) 300 MG capsule Take 300 mg by mouth 3 (three) times daily.     Marland Kitchen ipratropium-albuterol (DUONEB) 0.5-2.5 (3) MG/3ML SOLN 4 times a day (breakfast, lunch, dinner, bedtime).Can take 2 additional treatments if needed on bad days. DX J43.8 360 mL 3  . lamoTRIgine (LAMICTAL) 100 MG tablet TAKE ONE TABLET EVERY DAY 30 tablet 2  . loratadine (CLARITIN) 10 MG tablet Take 10 mg by mouth daily as needed for allergies (seasonal).     . Oxycodone HCl 10 MG TABS Take 10 mg by mouth 2 (two) times daily.     . pantoprazole (PROTONIX) 40 MG tablet Take 1 tablet (40 mg total) by mouth 2 (two) times daily. 60 tablet 2  . polyethylene glycol powder (GLYCOLAX/MIRALAX) powder TAKE 17G DISSOLVED IN 8OZ WATER DAILY ASNEEDED FOR MODERATE CONSTIPATION 527 g 2  . PROAIR HFA 108 (90 BASE) MCG/ACT inhaler INHALE ONE PUFF BY MOUTH EVERY 2 TO 4 HOURS AS NEEDED FOR WHEEZING 8.5 g 12  . senna (SENOKOT) 8.6 MG TABS tablet Take 2 tablets (17.2 mg total) by mouth 3 (three) times daily as needed for mild constipation. 120 each 0  . Vitamin D,  Ergocalciferol, (DRISDOL) 50000 UNITS CAPS capsule Take 1 capsule (50,000 Units total) by mouth every 7 (seven) days. (Patient taking differently: Take 50,000 Units by mouth every Sunday. ) 8 capsule 0   No current facility-administered medications for this visit.   Family History  Problem Relation Age of Onset  . Stroke Neg Hx   . Cancer Neg Hx   . Myelodysplastic syndrome Father     Died from myelofibrosis though diagnosed post-mortem   Social History   Social History  . Marital Status: Divorced    Spouse Name: N/A  . Number of Children: 2  . Years of Education: N/A   Occupational History  . retired    Social History Main Topics  . Smoking status: Current Every Day Smoker -- 0.20 packs/day for 45 years    Types: Cigarettes  . Smokeless tobacco: Never Used     Comment: 11/11/2014 ""smoking 1  cigarettes/day; that's down from 2 ppd"  . Alcohol Use: 0.0 oz/week    0 Standard drinks or equivalent per week     Comment: 11/11/2014 "nothing in 7 years; never had a problem w/it"  . Drug Use: No  . Sexual Activity: No   Other Topics Concern  . 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 RESPIRATORY- BAseline Cough and SOB, unchanged, on 3L of O2. CARDIAC- No Palpitations,  or chest pain. GI- Has abdominal pain and nausea. URINARY- No Frequency, urgency, straining or dysuria.  Objective:  Physical Exam: Filed Vitals:   11/21/14 1016  BP: 150/69  Pulse: 70  Temp: 98.1 F (36.7 C)  TempSrc: Oral  Height: 5\' 1"  (1.549 m)  SpO2: 93%   GENERAL- alert, co-operative, appears as stated age, not in any distress. HEENT- Atraumatic, normocephalic, PERRL, EOMI, oral mucosa appears moist,  CARDIAC- RRR, no murmurs, rubs or gallops. RESP- Moving equal volumes of air, minimal/very faint expiratory wheezes all lung zones, no crackles. ABDOMEN- Soft, nontender,  bowel sounds present. NEURO- Alert and oriented, moving all extremities voluntarily, using a rolling walker.  EXTREMITIES-  no pedal edema. PSYCH- Normal mood and affect, appropriate thought content and speech.  Assessment & Plan:  The patient's case and plan of care was discussed with attending physician, Dr. Ellwood Dense.  Please see problem based charting for assessment and plan.

## 2014-11-21 NOTE — Assessment & Plan Note (Signed)
Order for mammogram placed.

## 2014-11-21 NOTE — Assessment & Plan Note (Signed)
Appears stable at baseline. Cough, SOB at baseline. She is on chronic 3L of O2 at home.

## 2014-11-21 NOTE — Patient Instructions (Signed)
We have prescribed a medication called tramadol to help with your pain. You can take one tablet as needed every 6 hours for pain, and if this is not effective, then you can try 2 tablets.

## 2014-11-21 NOTE — Progress Notes (Signed)
Internal Medicine Clinic Attending  Case discussed with Dr. Emokpae at the time of the visit.  We reviewed the resident's history and exam and pertinent patient test results.  I agree with the assessment, diagnosis, and plan of care documented in the resident's note.  

## 2014-11-21 NOTE — Assessment & Plan Note (Signed)
Pt endorses persistence of abdominal pain, she has good days and bad days. She rates the pain as a 10/10 sometimes. She also has some nausea. She was given Zofran pills on discharge, but she says these are not effective. She was given the ODT of zofran in the hospital, her insurance will not cover, unless prior approved. LAst bowel movement was today, well formed, no blood, and not black. She has not had any vomiting since she left the hospital.  Plan- ODT Zofran prescribed, Ulis Rias is working on getting that approved, otherwise she will pay out of pocket, or we will try [place]. - Tramadol for pain- 50mg  Q6H for pain. - CBC today

## 2014-11-22 LAB — CBC
HEMATOCRIT: 38.2 % (ref 34.0–46.6)
Hemoglobin: 11.5 g/dL (ref 11.1–15.9)
MCH: 29 pg (ref 26.6–33.0)
MCHC: 30.1 g/dL — ABNORMAL LOW (ref 31.5–35.7)
MCV: 97 fL (ref 79–97)
Platelets: 740 10*3/uL — ABNORMAL HIGH (ref 150–379)
RBC: 3.96 x10E6/uL (ref 3.77–5.28)
RDW: 19.3 % — ABNORMAL HIGH (ref 12.3–15.4)
WBC: 7.5 10*3/uL (ref 3.4–10.8)

## 2014-11-24 ENCOUNTER — Ambulatory Visit (INDEPENDENT_AMBULATORY_CARE_PROVIDER_SITE_OTHER): Payer: Medicare Other | Admitting: Internal Medicine

## 2014-11-24 ENCOUNTER — Encounter: Payer: Self-pay | Admitting: Internal Medicine

## 2014-11-24 ENCOUNTER — Ambulatory Visit: Payer: Self-pay | Admitting: Physician Assistant

## 2014-11-24 VITALS — BP 104/54 | HR 76 | Temp 98.1°F | Ht 61.0 in

## 2014-11-24 DIAGNOSIS — K253 Acute gastric ulcer without hemorrhage or perforation: Secondary | ICD-10-CM | POA: Diagnosis not present

## 2014-11-24 DIAGNOSIS — R1013 Epigastric pain: Secondary | ICD-10-CM

## 2014-11-24 MED ORDER — SUCRALFATE 1 G PO TABS: 1.0000 g | ORAL_TABLET | Freq: Three times a day (TID) | ORAL | Status: DC

## 2014-11-24 MED ORDER — PROMETHAZINE HCL 12.5 MG PO TABS: 12.5000 mg | ORAL_TABLET | Freq: Four times a day (QID) | ORAL | Status: DC | PRN

## 2014-11-24 NOTE — Assessment & Plan Note (Addendum)
C/o Persistent pain today and some vomiting last night only. She was able to get the ODT zofran, but says that did not help much. She denies frequency or dysuria. CT abdomen was done- 8/23 showed Duodenitis/gastritis. Lipase was normal also on admission. Pain is the same that has warranted admission last month. She has ben taking tramadol- 50mg  BID- occasionally she takes 2 tabs but she thinks this does not help. Pt has a hx with Narcotic prescription, she has a hx of suicide ideation, accidental overdose, chronic pain, our clinic is not supposed to be prescribing pain meds- She is supposed to be following with a pain clinic.  - Prescribe sucralfate 1g before meals, 4 times a day, for 4-8 weeks for duodenal ulcer, might provide some relief. - Cont tramadol, might have to increase dose to 50mg  Q6H -  BMET today with c/o of vomiting - UA- rule out UTI - Lipase - CT colonoscopy which could not be done as an outpt as it required prior authorization. Pt has severe COPD on 3L on O2 at home. - switch Zofran to Phenegan 12.5mg  Q6H PRN  Addendum- K- 3.2, likely related to vomiting and reduced PO intake. Will prescribe 2 doses of 42meq KDur. Otherwise lab work unremarkable.  - She has a pain clinic that prescribes her pain meds, she will call them for an appointment, she still has at least 25 tablets of the tramadol left, but she has been encouraged to follow up with them.

## 2014-11-24 NOTE — Patient Instructions (Addendum)
We will be prescribing a stronger to help with the nausea. For now continue taking the Miralax once a day. Also please bring in the tramadol and Zofran tablets have been taking.

## 2014-11-24 NOTE — Progress Notes (Signed)
Patient ID: [name], female   DOB: 1950-10-13, 64 y.o.   MRN: [id number]   Subjective:   Patient ID: [name] female   DOB: Aug 26, 1950 64 y.o.   MRN: [id number]  HPI: [name] is a 64 y.o. with PMH listed below, was seen last Friday for HFU. Here today with c/o of persistent abdominal pain requesting something stronger, that tramadol did not work.   Past Medical History  Diagnosis Date  . Depression   . Suicidal ideation 2007     attempted overdose 2012  Dr. Tivis Ringer report  . Bipolar disorder   . Substance abuse      narcotics, alcohol, tobacco  . Chronic pain syndrome     follows at [facility]  . DEFICIENCY, VITAMIN D NOS 01/03/2007  . Chronic lower back pain   . Anxiety   . OSTEOPOROSIS 06/17/2009    DEXA 05/2009 : L femur -2.9; R femur -2.5. Alendronate on med list but not taking. Needs addressed ASAP as h/o fractures.    . Elevated liver function tests   . On home oxygen therapy     "3L; 24/7" (8/30//2016)  . Shortness of breath     "all the time" (02/27/2013)  . 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 Sciatic pain   . Anemia   . Pneumonia X 1?  . Hyperthyroidism     "borderline"  . History of blood transfusion 03/2014; 10/2014    "low HgB" (11/11/2014)  . Duodenal ulcer, chronic   . Arthritis     "all in my body, primarily left hip" (11/11/2014)   Review of Systems: CONSTITUTIONAL- No Fever, weightloss, night sweat or change in appetite. SKIN- No Rash, colour changes or itching. HEAD- No Headache or dizziness. RESPIRATORY- Chronic Cough or SOB at baseline. CARDIAC- No Palpitations,  chest pain. GI- No vomiting, diarrhoea,, abd pain. URINARY- No Frequency, dysuria.  Objective:  Physical Exam: Filed Vitals:   11/24/14 1112  BP: 104/54  Pulse: 76  Temp: 98.1 F (36.7 C)  TempSrc: Oral  SpO2: 100%   GENERAL- alert, co-operative, appears as stated age, not in any  distress. HEENT- Atraumatic, normocephalic, PERRL,oral mucosa appears moist, neck supple. CARDIAC- RRR, no murmurs, rubs or gallops. RESP- Moving equal volumes of air, and clear to auscultation bilaterally, no wheezes or crackles. ABDOMEN- Soft, mild tenderness epigastric area without guarding, bowel sounds present. NEURO-Using a Rolling walker, alert and oriented.  EXTREMITIES- pulse 2+, symmetric, no pedal edema. SKIN- Warm, dry, No rash or lesion. PSYCH- Normal mood and affect, appropriate thought content and speech.  Assessment & Plan:   The patient's case and plan of care was discussed with attending physician, Dr. Ellwood Dense.  Please see problem based charting for assessment and plan.

## 2014-11-24 NOTE — Progress Notes (Signed)
Internal Medicine Clinic Attending  Case discussed with Dr. Emokpae at the time of the visit.  We reviewed the resident's history and exam and pertinent patient test results.  I agree with the assessment, diagnosis, and plan of care documented in the resident's note.  

## 2014-11-25 LAB — URINALYSIS, ROUTINE W REFLEX MICROSCOPIC
BILIRUBIN UA: NEGATIVE
GLUCOSE, UA: NEGATIVE
LEUKOCYTES UA: NEGATIVE
Nitrite, UA: NEGATIVE
RBC UA: NEGATIVE
Urobilinogen, Ur: 0.2 mg/dL (ref 0.2–1.0)
pH, UA: 5.5 (ref 5.0–7.5)

## 2014-11-25 LAB — MICROSCOPIC EXAMINATION: Casts: NONE SEEN /lpf

## 2014-11-25 LAB — BASIC METABOLIC PANEL
BUN / CREAT RATIO: 20 (ref 11–26)
BUN: 9 mg/dL (ref 8–27)
CHLORIDE: 97 mmol/L (ref 97–108)
CO2: 28 mmol/L (ref 18–29)
Calcium: 8.2 mg/dL — ABNORMAL LOW (ref 8.7–10.3)
Creatinine, Ser: 0.44 mg/dL — ABNORMAL LOW (ref 0.57–1.00)
GFR calc non Af Amer: 108 mL/min/{1.73_m2} (ref >59)
GFR, EST AFRICAN AMERICAN: 124 mL/min/{1.73_m2} (ref >59)
Glucose: 87 mg/dL (ref 65–99)
POTASSIUM: 3.2 mmol/L — AB (ref 3.5–5.2)
SODIUM: 145 mmol/L — AB (ref 134–144)

## 2014-11-25 LAB — LIPASE: Lipase: 11 U/L (ref 0–59)

## 2014-11-25 NOTE — Telephone Encounter (Signed)
Second call placed to PASRR/[facility] today.  [facility] requested this worker to message back again in system to trigger item to be reassigned to nurse, stating glitch in system causes this to occur at times. This was completed.

## 2014-11-27 ENCOUNTER — Telehealth: Payer: Self-pay | Admitting: Licensed Clinical Social Worker

## 2014-11-27 NOTE — Telephone Encounter (Signed)
CSW received [facility] PASRR has been cancelled stating pt had been discharged.  CSW placed call to [facility] Medicaid Uniform Screening Program and informed nursing pt was always outpatient and PASRR was initiated from ambulatory site, PCP office.  CSW call has been forwarded to [place] who made determination.  Awaiting return call.

## 2014-11-28 ENCOUNTER — Other Ambulatory Visit: Payer: Self-pay | Admitting: Internal Medicine

## 2014-11-28 DIAGNOSIS — M545 Low back pain: Secondary | ICD-10-CM | POA: Diagnosis not present

## 2014-11-28 DIAGNOSIS — G8929 Other chronic pain: Secondary | ICD-10-CM | POA: Diagnosis not present

## 2014-11-28 NOTE — Telephone Encounter (Signed)
She just got 30 phenergan on the 12th. IF she is that symptomatic, needs seen ASAP.

## 2014-11-28 NOTE — Telephone Encounter (Signed)
Talked with pt - has new bottle with 30 tab of phenergan in it - takes med only when needed.

## 2014-12-02 NOTE — Telephone Encounter (Signed)
PASRR has been resubmitted.

## 2014-12-03 NOTE — Telephone Encounter (Signed)
CSW returned call to pt's sister, [name].  [name] states [name] had her PASRR interview on 12/02/14 and was told she would receive a PASRR number today.  CSW has not received any inbox or notifications.  CSW placed call to PASRR, informed pt's information has been sent to [facility] which is last step in the review.  Sister notified.

## 2014-12-04 ENCOUNTER — Encounter: Payer: Self-pay | Admitting: Licensed Clinical Social Worker

## 2014-12-04 NOTE — Progress Notes (Signed)
CSW provided printed name of witness and faxed

## 2014-12-04 NOTE — Progress Notes (Signed)
Hi Shana,  Got a message back from [place] [facility] regarding the Consent Form for Larissa Pegg.  Per: [facility] witness needs to print name on the consent form. If you have any questions please contact Ms. Tonna Boehringer at [phone].  Once you have the form completed please attach to this email or fax the document to [phone]   [place] [facility] [address] [place], [place] [address]    [phone]  Office [phone]  Mobile [email or web address]

## 2014-12-05 ENCOUNTER — Encounter: Payer: Self-pay | Admitting: Internal Medicine

## 2014-12-05 ENCOUNTER — Ambulatory Visit (HOSPITAL_COMMUNITY)
Admission: RE | Admit: 2014-12-05 | Discharge: 2014-12-05 | Disposition: A | Discharge status: Home / Self Care | Payer: Medicare Other | Source: Ambulatory Visit | Attending: Internal Medicine | Admitting: Internal Medicine

## 2014-12-05 ENCOUNTER — Ambulatory Visit (INDEPENDENT_AMBULATORY_CARE_PROVIDER_SITE_OTHER): Payer: Medicare Other | Admitting: Internal Medicine

## 2014-12-05 VITALS — BP 143/64 | HR 83 | Temp 99.6°F | Ht 61.0 in

## 2014-12-05 DIAGNOSIS — R109 Unspecified abdominal pain: Secondary | ICD-10-CM | POA: Diagnosis not present

## 2014-12-05 DIAGNOSIS — G8929 Other chronic pain: Secondary | ICD-10-CM | POA: Diagnosis not present

## 2014-12-05 DIAGNOSIS — R110 Nausea: Secondary | ICD-10-CM | POA: Diagnosis not present

## 2014-12-05 DIAGNOSIS — K269 Duodenal ulcer, unspecified as acute or chronic, without hemorrhage or perforation: Secondary | ICD-10-CM

## 2014-12-05 DIAGNOSIS — K5900 Constipation, unspecified: Secondary | ICD-10-CM

## 2014-12-05 DIAGNOSIS — R197 Diarrhea, unspecified: Secondary | ICD-10-CM | POA: Diagnosis not present

## 2014-12-05 DIAGNOSIS — K259 Gastric ulcer, unspecified as acute or chronic, without hemorrhage or perforation: Secondary | ICD-10-CM | POA: Diagnosis not present

## 2014-12-05 DIAGNOSIS — M810 Age-related osteoporosis without current pathological fracture: Secondary | ICD-10-CM

## 2014-12-05 DIAGNOSIS — R1084 Generalized abdominal pain: Secondary | ICD-10-CM | POA: Diagnosis not present

## 2014-12-05 DIAGNOSIS — Z8719 Personal history of other diseases of the digestive system: Secondary | ICD-10-CM

## 2014-12-05 DIAGNOSIS — D509 Iron deficiency anemia, unspecified: Secondary | ICD-10-CM

## 2014-12-05 LAB — CBC
HEMATOCRIT: 35.4 % — AB (ref 36.0–46.0)
HEMOGLOBIN: 10.8 g/dL — AB (ref 12.0–15.0)
MCH: 29.5 pg (ref 26.0–34.0)
MCHC: 30.5 g/dL (ref 30.0–36.0)
MCV: 96.7 fL (ref 78.0–100.0)
Platelets: 663 10*3/uL — ABNORMAL HIGH (ref 150–400)
RBC: 3.66 MIL/uL — ABNORMAL LOW (ref 3.87–5.11)
RDW: 18 % — ABNORMAL HIGH (ref 11.5–15.5)
WBC: 7.9 10*3/uL (ref 4.0–10.5)

## 2014-12-05 LAB — COMPREHENSIVE METABOLIC PANEL
ALK PHOS: 112 U/L (ref 38–126)
ALT: 10 U/L — ABNORMAL LOW (ref 14–54)
ANION GAP: 11 (ref 5–15)
AST: 15 U/L (ref 15–41)
Albumin: 3.3 g/dL — ABNORMAL LOW (ref 3.5–5.0)
BILIRUBIN TOTAL: 0.2 mg/dL — AB (ref 0.3–1.2)
BUN: 12 mg/dL (ref 6–20)
CALCIUM: 8.5 mg/dL — AB (ref 8.9–10.3)
CO2: 32 mmol/L (ref 22–32)
CREATININE: 0.52 mg/dL (ref 0.44–1.00)
Chloride: 100 mmol/L — ABNORMAL LOW (ref 101–111)
GFR calc non Af Amer: >60 mL/min (ref >60)
Glucose, Bld: 91 mg/dL (ref 65–99)
Potassium: 4.5 mmol/L (ref 3.5–5.1)
Sodium: 143 mmol/L (ref 135–145)
TOTAL PROTEIN: 6.6 g/dL (ref 6.5–8.1)

## 2014-12-05 MED ORDER — SUCRALFATE 1 GM/10ML PO SUSP: 1.0000 g | Freq: Three times a day (TID) | ORAL | Status: DC

## 2014-12-05 NOTE — Patient Instructions (Addendum)
Please bring your medicines with you each time you come to clinic.  Medicines may include prescription medications, over-the-counter medications, herbal remedies, eye drops, vitamins, or other pills.  Please take sucralafate and Phenergan to see how your symptoms improve.   Please come back Monday with all your medications.   Progress Toward Treatment Goals:  No flowsheet data found.  Self Care Goals & Plans:  Self Care Goal 12/05/2014  Manage my medications take my medicines as prescribed; bring my medications to every visit; refill my medications on time  Monitor my health -  Eat healthy foods drink diet soda or water instead of juice or soda; eat more vegetables; eat foods that are low in salt; eat baked foods instead of fried foods; eat fruit for snacks and desserts  Be physically active -  Stop smoking go to the [employer] ([email or web address]    No flowsheet data found.   Care Management & Community Referrals:  No flowsheet data found.

## 2014-12-05 NOTE — Progress Notes (Signed)
Internal Medicine Clinic Attending  Case discussed with Dr. Charlott Rakes at the time of the visit.  We reviewed the resident's history and exam and pertinent patient test results.  I agree with the assessment, diagnosis, and plan of care documented in the resident's note.  Abdominal pain/vomiting likely due to chronic PUD. Patient denies taking sucralfate and PPI although they have been delivered to her house on 11/25/14. Denies black stool or BRBPR. Complains of occassional dizziness but not orthostatic, possibly generalized weakness from low PO intake. Advise patient to start PPI and carafate. Request patient to come coming Monday with all her medication bottles for medication management and recommend a visit with Dr Mannie Stabile (pharmacy). Patient needs PCS with medication management services and Ms Golden Hurter is working on this. Madilyn Fireman MD MPH 12/05/2014 4:33 PM

## 2014-12-05 NOTE — Assessment & Plan Note (Signed)
Upon review of her medications, and appear she was on tramadol while also receiving oxycodone 10 mg twice daily from her pain clinic. I advised her to stop taking tramadol given that she has a history of confusion and presented to the clinic not too long ago with signs suggestive of opioid toxicity. She acknowledged understanding.   I also asked her about gabapentin and she was unaware if she was actually taking this medication or not. This will need to be addressed on Monday during her medical reconciliation.

## 2014-12-05 NOTE — Assessment & Plan Note (Signed)
She is not taking alendronate or vitamin D which should be reviewed again at the time of medical reconciliation on Monday.

## 2014-12-05 NOTE — Assessment & Plan Note (Addendum)
Overview She reports that she has been ongoing abdominal pain which has not improved since her last visit. She describes her pain as a stabbing, sharp, achy pain which sometimes improves with food [cereal, coffee]. She has been eating 4-5 times a day that has also been vomiting at least once a day. Her bowel movements are occurring later and later in the day, and she feels her last one may have been either yesterday or the day before. She denies any bleeding or changes in her stool. She is not been taking Protonix as she feels it makes her symptoms worse. Zofran has not improved her symptoms, and she was unaware of Phenergan and sucralfate. She has also not been taking MiraLAX or senna though has been taking tramadol but cannot recall the quantity. Other symptoms that she reports include dizziness, congestion, runny nose which she feels have been associated with seasonal allergies as she is afebrile.  Assessment Suspect that she may be constipated though would also be concerned of acute bleeding from her duodenal ulcer given that she has not been taking a PPI.   Plan Check orthostatics, CBC, CMET, abdominal x-ray to further evaluate her symptoms.  ADDENDUM 12/05/2014  5:29 PM:  Orthostatic vital signs are reassuring lying 153/75, pulse 75 Sitting 140/68, pulse 75 Standing 147/70, pulse 79  CMET and CBC reassuring for no acute blood loss her electrolyte imbalances to account for her symptoms. Abdominal x-ray without findings suggestive of obstruction or perforation.   Recommended to the patient that she needed to try Phenergan and sucralfate. Switched sucralfate oral to liquid suspension so that she had better tolerated. She is agreeable to follow-up in 2 days at which time she would like to bring in all of her medicines to clarify what she should and should not be taking. She is adamant that Protonix does not make her feel well, and this too should be reviewed on Monday to ensure and verified that  she is taking the right medication she needs to allow for ulcer healing.

## 2014-12-05 NOTE — Assessment & Plan Note (Deleted)
Overview She reports that she has been ongoing abdominal pain which has not improved since her last visit. She describes her pain as a stabbing, sharp, achy pain which sometimes improves with food [cereal, coffee]. She has been eating 4-5 times a day that has also been vomiting at least once a day. Her bowel movements are occurring later and later in the day, and she feels her last one may have been either yesterday or the day before. She denies any bleeding or changes in her stool. She is not been taking Protonix as she feels it makes her symptoms worse. Zofran has not improved her symptoms, and she was unaware of Phenergan and sucralfate. She has also not been taking MiraLAX or senna though has been taking tramadol but cannot recall the quantity. Other symptoms that she reports include dizziness, congestion, runny nose which she feels have been associated with seasonal allergies as she is afebrile.  Assessment Suspect that she may be constipated though would also be concerned of acute bleeding from her duodenal ulcer given that she has not been taking a PPI.   Plan Check orthostatics, CBC, CMET, abdominal x-ray to further evaluate her symptoms.

## 2014-12-05 NOTE — Assessment & Plan Note (Signed)
She is not taking MiraLAX or Senokot which again should be reviewed at the time of medical reconciliation on Monday given that she has been found to be constipated on multiple hospitalizations.

## 2014-12-05 NOTE — Progress Notes (Signed)
   Subjective:    Patient ID: [name], female    DOB: 06/21/1950, 64 y.o.   MRN: [id number]  HPI Ms. Space is a 64 year old female with COPD, duodenal ulcer, chronic pain, vitamin D deficiency, ongoing tobacco abuse who presents today for evaluation of abdominal pain. Please see assessment & plan for documentation of chronic medical problems.    Review of Systems  Constitutional: Negative for fever.  HENT: Positive for rhinorrhea.   Respiratory: Positive for wheezing.   Gastrointestinal: Positive for nausea, vomiting, abdominal pain and abdominal distention. Negative for diarrhea, constipation and blood in stool.       Objective:   Physical Exam Constitutional: Elderly appearing Caucasian female. No distress.  Head: Normocephalic and atraumatic.  Eyes: Conjunctivae are normal. Pupils are 4 mm, direct, consensual, near.  Cardiovascular: Normal rate, regular rhythm and normal heart sounds.  No gallop, friction rub, murmur heard. Pulmonary/Chest: Wheezing most prominent at the bases bilaterally. Abdominal: Mild to moderate distention with pain on deep palpation. Normoactive bowel sounds.  Neurological: Alert and oriented to person, place, and time.  Coordination normal.  Skin: Warm and dry. Not diaphoretic.  Psychiatric: Affect appropriate.         Assessment & Plan:

## 2014-12-08 ENCOUNTER — Ambulatory Visit (INDEPENDENT_AMBULATORY_CARE_PROVIDER_SITE_OTHER): Payer: Medicare Other | Admitting: Internal Medicine

## 2014-12-08 VITALS — BP 150/60 | HR 77 | Temp 98.9°F | Ht 61.0 in

## 2014-12-08 DIAGNOSIS — K269 Duodenal ulcer, unspecified as acute or chronic, without hemorrhage or perforation: Secondary | ICD-10-CM

## 2014-12-08 DIAGNOSIS — G8929 Other chronic pain: Secondary | ICD-10-CM | POA: Diagnosis not present

## 2014-12-08 DIAGNOSIS — K5900 Constipation, unspecified: Secondary | ICD-10-CM

## 2014-12-08 DIAGNOSIS — M810 Age-related osteoporosis without current pathological fracture: Secondary | ICD-10-CM

## 2014-12-08 DIAGNOSIS — G4700 Insomnia, unspecified: Secondary | ICD-10-CM

## 2014-12-08 MED ORDER — MELATONIN 3 MG PO TABS: 1.0000 | ORAL_TABLET | Freq: Every evening | ORAL | Status: DC | PRN

## 2014-12-08 NOTE — Assessment & Plan Note (Signed)
Patient complains of trouble falling asleep and staying asleep. She was previously on Ambien 5 mg QHS years ago. She requests to be restarted on Ambien. After discussion of risks of Ambien, patient is agreeable to trying Melatonin first. I have sent a message to her PCP letting him know that she did want Ambien, possibly in the future if melatonin doesn't work.   Plan: -Melatonin 3 mg QHS prn

## 2014-12-08 NOTE — Progress Notes (Signed)
12/08/14: CSW received [place] Level II Determination Notification.  PASRR number: [id number] K   Pt/Family and Facility notified by phone.  Faxed notification to Abigail Butts at [place].

## 2014-12-08 NOTE — Assessment & Plan Note (Addendum)
At last visit, it was unclear if patient was taking alendronate or vitamin D. Patient has been compliant with alendronate, but has not been taking vitamin D. It is unclear if she has this prescription.

## 2014-12-08 NOTE — Patient Instructions (Signed)
CONTINUE TAKING PEPTO-BISMOL AND MIRALAX DAILY FOR YOUR ABDOMINAL PAIN. CONTINUE TAKING YOUR SUCRALFATE, PROTONIX, AND ZOFRAN AS WELL. WE HAVE PLACED A REFERRAL FOR YOU TO SEE GASTROENTEROLOGY. YOU WILL BE CALLED ABOUT THIS. FOLLOW UP IN 1-2 WEEKS WITH [place].   WE HAVE ALSO ORDERED MELATONIN 3 MG ONE PILL AT NIGHT AS NEEDED FOR SLEEP.   Constipation Constipation is when a person has fewer than three bowel movements a week, has difficulty having a bowel movement, or has stools that are dry, hard, or larger than normal. As people grow older, constipation is more common. If you try to fix constipation with medicines that make you have a bowel movement (laxatives), the problem may get worse. Long-term laxative use may cause the muscles of the colon to become weak. A low-fiber diet, not taking in enough fluids, and taking certain medicines may make constipation worse.  CAUSES   Certain medicines, such as antidepressants, pain medicine, iron supplements, antacids, and water pills.   Certain diseases, such as diabetes, irritable bowel syndrome (IBS), thyroid disease, or depression.   Not drinking enough water.   Not eating enough fiber-rich foods.   Stress or travel.   Lack of physical activity or exercise.   Ignoring the urge to have a bowel movement.   Using laxatives too much.  SIGNS AND SYMPTOMS   Having fewer than three bowel movements a week.   Straining to have a bowel movement.   Having stools that are hard, dry, or larger than normal.   Feeling full or bloated.   Pain in the lower abdomen.   Not feeling relief after having a bowel movement.  DIAGNOSIS  Your health care provider will take a medical history and perform a physical exam. Further testing may be done for severe constipation. Some tests may include:  A barium enema X-ray to examine your rectum, colon, and, sometimes, your small intestine.   A sigmoidoscopy to examine your lower colon.   A  colonoscopy to examine your entire colon. TREATMENT  Treatment will depend on the severity of your constipation and what is causing it. Some dietary treatments include drinking more fluids and eating more fiber-rich foods. Lifestyle treatments may include regular exercise. If these diet and lifestyle recommendations do not help, your health care provider may recommend taking over-the-counter laxative medicines to help you have bowel movements. Prescription medicines may be prescribed if over-the-counter medicines do not work.  HOME CARE INSTRUCTIONS   Eat foods that have a lot of fiber, such as fruits, vegetables, whole grains, and beans.  Limit foods high in fat and processed sugars, such as french fries, hamburgers, cookies, candies, and soda.   A fiber supplement may be added to your diet if you cannot get enough fiber from foods.   Drink enough fluids to keep your urine clear or pale yellow.   Exercise regularly or as directed by your health care provider.   Go to the restroom when you have the urge to go. Do not hold it.   Only take over-the-counter or prescription medicines as directed by your health care provider. Do not take other medicines for constipation without talking to your health care provider first.  [place] IF:   You have bright red blood in your stool.   Your constipation lasts for more than 4 days or gets worse.   You have abdominal or rectal pain.   You have thin, pencil-like stools.   You have unexplained weight loss. MAKE SURE YOU:  Understand these instructions.  Will watch your condition.  Will get help right away if you are not doing well or get worse. Document Released: 11/27/2003 Document Revised: 03/05/2013 Document Reviewed: 12/10/2012 [facility] Patient Information 2015 [place], [place]. This information is not intended to replace advice given to you by your health care provider. Make sure you discuss any questions you  have with your health care provider.

## 2014-12-08 NOTE — Assessment & Plan Note (Addendum)
At last visit, it was unclear if patient was taking miralax or senokot. She's not.  Plan: -Resume Miralax daily

## 2014-12-08 NOTE — Assessment & Plan Note (Addendum)
At last visit it was unclear if patient was taking gabapentin and tramadol in addition to her oxycodone. Upon medication review, patient has been taking gabapentin and oxycodone, but does not have and has not been taking tramadol.

## 2014-12-08 NOTE — Progress Notes (Signed)
Subjective:    Patient ID: [name], female    DOB: 1951-01-10, 64 y.o.   MRN: [id number]  HPI  [name] is a 64 y.o. female with PMHx of COPD and substance abuse who presents to the clinic for follow up for abdominal pain. Please see A&P for the status of the patient's chronic medical problems.   Past Medical History  Diagnosis Date  . Depression   . Suicidal ideation 2007     attempted overdose 2012  Dr. Tivis Ringer report  . Bipolar disorder   . Substance abuse      narcotics, alcohol, tobacco  . Chronic pain syndrome     follows at [facility]  . DEFICIENCY, VITAMIN D NOS 01/03/2007  . Chronic lower back pain   . Anxiety   . OSTEOPOROSIS 06/17/2009    DEXA 05/2009 : L femur -2.9; R femur -2.5. Alendronate on med list but not taking. Needs addressed ASAP as h/o fractures.    . Elevated liver function tests   . On home oxygen therapy     "3L; 24/7" (8/30//2016)  . Shortness of breath     "all the time" (02/27/2013)  . 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 Sciatic pain   . Anemia   . Pneumonia X 1?  . Hyperthyroidism     "borderline"  . History of blood transfusion 03/2014; 10/2014    "low HgB" (11/11/2014)  . Duodenal ulcer, chronic   . Arthritis     "all in my body, primarily left hip" (11/11/2014)    Outpatient Encounter Prescriptions as of 12/08/2014  Medication Sig  . alendronate (FOSAMAX) 70 MG tablet Take 1 tablet (70 mg total) by mouth once a week. Take with a full glass of water on an empty stomach. (Patient not taking: Reported on 12/05/2014)  . fluticasone (FLONASE) 50 MCG/ACT nasal spray Place 1 spray into both nostrils daily.  . Fluticasone-Salmeterol (ADVAIR DISKUS) 250-50 MCG/DOSE AEPB INHALE 1 PUFF INTO THE LUNGS 2 TIMES DAILY  . gabapentin (NEURONTIN) 300 MG capsule Take 300 mg by mouth 3 (three) times daily.   Marland Kitchen ipratropium-albuterol (DUONEB) 0.5-2.5 (3) MG/3ML SOLN 4 times a day  (breakfast, lunch, dinner, bedtime).Can take 2 additional treatments if needed on bad days. DX J43.8  . lamoTRIgine (LAMICTAL) 100 MG tablet TAKE ONE TABLET EVERY DAY (Patient not taking: Reported on 12/05/2014)  . loratadine (CLARITIN) 10 MG tablet Take 10 mg by mouth daily as needed for allergies (seasonal).   . ondansetron (ZOFRAN-ODT) 4 MG disintegrating tablet Take 1 tablet (4 mg total) by mouth every 8 (eight) hours as needed for nausea or vomiting. (Patient not taking: Reported on 12/05/2014)  . Oxycodone HCl 10 MG TABS Take 10 mg by mouth 2 (two) times daily.   . pantoprazole (PROTONIX) 40 MG tablet Take 1 tablet (40 mg total) by mouth 2 (two) times daily. (Patient not taking: Reported on 12/05/2014)  . polyethylene glycol powder (GLYCOLAX/MIRALAX) powder TAKE 17G DISSOLVED IN 8OZ WATER DAILY ASNEEDED FOR MODERATE CONSTIPATION (Patient not taking: Reported on 12/05/2014)  . PROAIR HFA 108 (90 BASE) MCG/ACT inhaler INHALE ONE PUFF BY MOUTH EVERY 2 TO 4 HOURS AS NEEDED FOR WHEEZING  . promethazine (PHENERGAN) 12.5 MG tablet Take 1 tablet (12.5 mg total) by mouth every 6 (six) hours as needed for nausea or vomiting. (Patient not taking: Reported on 12/05/2014)  . senna (SENOKOT) 8.6 MG TABS tablet Take 2  tablets (17.2 mg total) by mouth 3 (three) times daily as needed for mild constipation. (Patient not taking: Reported on 12/05/2014)  . sucralfate (CARAFATE) 1 GM/10ML suspension Take 10 mLs (1 g total) by mouth 4 (four) times daily -  with meals and at bedtime.  . Vitamin D, Ergocalciferol, (DRISDOL) 50000 UNITS CAPS capsule Take 1 capsule (50,000 Units total) by mouth every 7 (seven) days. (Patient not taking: Reported on 12/05/2014)   No facility-administered encounter medications on file as of 12/08/2014.    Family History  Problem Relation Age of Onset  . Stroke Neg Hx   . Cancer Neg Hx   . Myelodysplastic syndrome Father     Died from myelofibrosis though diagnosed post-mortem    Social  History   Social History  . Marital Status: Divorced    Spouse Name: N/A  . Number of Children: 2  . Years of Education: N/A   Occupational History  . retired    Social History Main Topics  . Smoking status: Current Every Day Smoker -- 0.10 packs/day for 45 years    Types: Cigarettes  . Smokeless tobacco: Never Used     Comment: 11/11/2014 ""smoking 1 cigarettes/day; that's down from 2 ppd"  . Alcohol Use: 0.0 oz/week    0 Standard drinks or equivalent per week     Comment: 11/11/2014 "nothing in 7 years; never had a problem w/it"  . Drug Use: No  . Sexual Activity: No   Other Topics Concern  . 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 Denies chest tightness Cardiovascular: Denies chest pain and palpitations.  Gastrointestinal: Admits to nausea, abdominal pain, flatus, abdominal distention and bloating. Denies diarrhea, constipation, blood in stool Genitourinary: Denies dysuria, urgency, frequency Musculoskeletal: Admits to back pain (chronic). Denies myalgias, joint swelling Neurological: Denies dizziness, headaches, weakness, lightheadedness Psychiatric/Behavioral: Admits to insomnia.      Objective:   Physical Exam Filed Vitals:   12/08/14 1606  BP: 150/60  Pulse: 77  Temp: 98.9 F (37.2 C)  TempSrc: Oral  Height: 5\' 1"  (1.549 m)  SpO2: 99%   General: Vital signs reviewed.  Patient is thin, chronically ill appearing female, in no acute distress and cooperative with exam.  Cardiovascular: RRR, S1 normal, S2 normal, no murmurs, gallops, or rubs. Pulmonary/Chest: Faint expiratory wheezes on anterior  auscultation. Clear to auscultation bilaterally posteriorly, no rales, or rhonchi. Abdominal: Distended, mildly tender diffusely on examination, worst in lower quadrants, decreased BS, tympanic, no masses, organomegaly, or guarding present.  Extremities: No lower extremity edema bilaterally, pulses symmetric and intact bilaterally.  Skin: Warm, dry and intact. No rashes or erythema. Psychiatric: Normal mood and affect. speech and behavior is normal. Cognition and memory are normal.      Assessment & Plan:   Please see problem oriented assessment and plan.

## 2014-12-08 NOTE — Assessment & Plan Note (Addendum)
Patient was seen on 9/23 with complaint of abdominal pain and vomiting. Pain was relieved by food. She denied any associated diarrhea, black stools or BRBPR. She stated she had not been taking protonix or sucralfate. CBC, CMET, and abdominal xray were reassuring. Orthostatic vitals signs were normal. DDx including Duodenal Ulcer and/or constipation. Patient was encouraged to take phenergan, protonix and sucralfate. Patient has been taking all of her medications as prescribed, but states she continues to have the pain and nausea. Pain is located in her RLQ and LLQ, 10/10, sharp, stabbing, relieved with pepto-bismol. No relieved with BM or passing flatus. She does admit to gas, bloating, distension. She denies diarrhea or GERD. She denies constipation as she is having one bowel movement a day, but given her physical exam and her symptoms I do think she is constipated. She has not been using Miralax (she does have it) because she didn't think she needed it.   Plan: -Miralax daily  -Continue sucralfate, protonix, zofran and pepto-bismol prn -CT colonoscopy ordered -Referral placed to follow up with GI -Follow up in 1-2 weeks

## 2014-12-09 NOTE — Progress Notes (Signed)
Internal Medicine Clinic Attending  Case discussed with Dr. Richardson soon after the resident saw the patient.  We reviewed the resident's history and exam and pertinent patient test results.  I agree with the assessment, diagnosis, and plan of care documented in the resident's note. 

## 2014-12-10 ENCOUNTER — Telehealth: Payer: Self-pay | Admitting: Licensed Clinical Social Worker

## 2014-12-10 NOTE — Telephone Encounter (Signed)
CSW returned call after receiving voicemail from [name].  Pt states her sister is her POA and thought she had provided [facility] copies approximately 5 years ago.  [name] states [facility] requesting copy.  CSW looked through [name]'s chart unable to find copy of advance directives.  CSW notified [name] and offered to fax blank advance directives to [facility], as pt has meeting there tomorrow with sister.  Pt requested information and forms to be faxed.  CSW placed call to [place], [facility] agreeable to receive and provide to [name] during scheduled meeting on 12/11/14.

## 2014-12-12 DIAGNOSIS — J449 Chronic obstructive pulmonary disease, unspecified: Secondary | ICD-10-CM | POA: Diagnosis not present

## 2014-12-15 ENCOUNTER — Other Ambulatory Visit: Payer: Self-pay

## 2014-12-16 ENCOUNTER — Telehealth: Payer: Self-pay | Admitting: Internal Medicine

## 2014-12-16 ENCOUNTER — Ambulatory Visit: Payer: Self-pay | Admitting: Internal Medicine

## 2014-12-16 NOTE — Telephone Encounter (Signed)
Baxter Flattery from [facility] requesting verbal order for Skilled Nurse and Physical therapy. Please call back.

## 2014-12-17 ENCOUNTER — Encounter: Payer: Self-pay | Admitting: *Deleted

## 2014-12-17 NOTE — Telephone Encounter (Signed)
Verbal for skilled nsg initial eval  And for PT

## 2014-12-18 ENCOUNTER — Other Ambulatory Visit: Payer: Self-pay | Admitting: *Deleted

## 2014-12-18 ENCOUNTER — Telehealth: Payer: Self-pay | Admitting: Internal Medicine

## 2014-12-18 DIAGNOSIS — F319 Bipolar disorder, unspecified: Secondary | ICD-10-CM | POA: Diagnosis not present

## 2014-12-18 DIAGNOSIS — M810 Age-related osteoporosis without current pathological fracture: Secondary | ICD-10-CM | POA: Diagnosis not present

## 2014-12-18 DIAGNOSIS — M1612 Unilateral primary osteoarthritis, left hip: Secondary | ICD-10-CM | POA: Diagnosis not present

## 2014-12-18 DIAGNOSIS — R262 Difficulty in walking, not elsewhere classified: Secondary | ICD-10-CM | POA: Diagnosis not present

## 2014-12-18 DIAGNOSIS — F329 Major depressive disorder, single episode, unspecified: Secondary | ICD-10-CM | POA: Diagnosis not present

## 2014-12-18 DIAGNOSIS — F17200 Nicotine dependence, unspecified, uncomplicated: Secondary | ICD-10-CM | POA: Diagnosis not present

## 2014-12-18 DIAGNOSIS — G894 Chronic pain syndrome: Secondary | ICD-10-CM | POA: Diagnosis not present

## 2014-12-18 DIAGNOSIS — Z9981 Dependence on supplemental oxygen: Secondary | ICD-10-CM | POA: Diagnosis not present

## 2014-12-18 DIAGNOSIS — F419 Anxiety disorder, unspecified: Secondary | ICD-10-CM | POA: Diagnosis not present

## 2014-12-18 DIAGNOSIS — J449 Chronic obstructive pulmonary disease, unspecified: Secondary | ICD-10-CM | POA: Diagnosis not present

## 2014-12-18 DIAGNOSIS — M5430 Sciatica, unspecified side: Secondary | ICD-10-CM | POA: Diagnosis not present

## 2014-12-18 DIAGNOSIS — Z9181 History of falling: Secondary | ICD-10-CM | POA: Diagnosis not present

## 2014-12-18 NOTE — Telephone Encounter (Signed)
Pt is at assisted living getting oxycodone 10mg  every 12 hours.  Pt requests rx for breakthrough pain, arthritis strength tylenol.  She needs rx for this to be sent to [facility].

## 2014-12-18 NOTE — Telephone Encounter (Signed)
Pt called requesting to get different pain medicine that she can take more often and not every twelve hours. Please call pt back.

## 2014-12-18 NOTE — Telephone Encounter (Signed)
Pt takes oxycodone 10mg  every 12 hours.  She would like something for breakthrough pain and has requested a rx for arthritis strength tylenol.   She is living in assisted living at [facility] and they require OTC meds to be rx.

## 2014-12-19 ENCOUNTER — Telehealth: Payer: Self-pay | Admitting: Internal Medicine

## 2014-12-19 DIAGNOSIS — F17200 Nicotine dependence, unspecified, uncomplicated: Secondary | ICD-10-CM | POA: Diagnosis not present

## 2014-12-19 DIAGNOSIS — M5430 Sciatica, unspecified side: Secondary | ICD-10-CM | POA: Diagnosis not present

## 2014-12-19 DIAGNOSIS — F319 Bipolar disorder, unspecified: Secondary | ICD-10-CM | POA: Diagnosis not present

## 2014-12-19 DIAGNOSIS — F329 Major depressive disorder, single episode, unspecified: Secondary | ICD-10-CM | POA: Diagnosis not present

## 2014-12-19 DIAGNOSIS — R262 Difficulty in walking, not elsewhere classified: Secondary | ICD-10-CM | POA: Diagnosis not present

## 2014-12-19 DIAGNOSIS — M1612 Unilateral primary osteoarthritis, left hip: Secondary | ICD-10-CM | POA: Diagnosis not present

## 2014-12-19 DIAGNOSIS — Z9981 Dependence on supplemental oxygen: Secondary | ICD-10-CM | POA: Diagnosis not present

## 2014-12-19 DIAGNOSIS — Z9181 History of falling: Secondary | ICD-10-CM | POA: Diagnosis not present

## 2014-12-19 DIAGNOSIS — J449 Chronic obstructive pulmonary disease, unspecified: Secondary | ICD-10-CM | POA: Diagnosis not present

## 2014-12-19 DIAGNOSIS — M810 Age-related osteoporosis without current pathological fracture: Secondary | ICD-10-CM | POA: Diagnosis not present

## 2014-12-19 DIAGNOSIS — F419 Anxiety disorder, unspecified: Secondary | ICD-10-CM | POA: Diagnosis not present

## 2014-12-19 DIAGNOSIS — G894 Chronic pain syndrome: Secondary | ICD-10-CM | POA: Diagnosis not present

## 2014-12-19 NOTE — Telephone Encounter (Signed)
[name] from [facility] called requesting verbal order for skilled Nurse. Please call back.

## 2014-12-22 ENCOUNTER — Encounter: Payer: Self-pay | Admitting: Internal Medicine

## 2014-12-22 ENCOUNTER — Telehealth: Payer: Self-pay | Admitting: Internal Medicine

## 2014-12-22 MED ORDER — ACETAMINOPHEN 500 MG PO TABS: 500.0000 mg | ORAL_TABLET | Freq: Two times a day (BID) | ORAL | Status: DC | PRN

## 2014-12-22 NOTE — Telephone Encounter (Signed)
VO given to [place] at [facility] for skilled nurse per Dr Alfonso Patten. Posey Pronto.

## 2014-12-22 NOTE — Telephone Encounter (Signed)
I have written for Tylenol. Per [place] DEA Database, she is on oxycodone and not an opiate paired with acetaminophen which could result in inadvertent toxicity.

## 2014-12-22 NOTE — Progress Notes (Addendum)
Patient ID: [name], female   DOB: 03/10/1951, 64 y.o.   MRN: [id number]  I am completing paperwork today that requires my signature:  Received 12/17/14 from [facility]: Order for regular diet. I have also requested she receive Ensure supplements three times daily per her most recent dietician assessment on 03/21/14.   Received 12/17/14 from [facility]: Notification that they will not be accepting her back into PT since she is difficult to reach.

## 2014-12-22 NOTE — Telephone Encounter (Signed)
Scott from [facility] called requesting verbal order for skilled nurse. Please call back.

## 2014-12-23 DIAGNOSIS — R262 Difficulty in walking, not elsewhere classified: Secondary | ICD-10-CM | POA: Diagnosis not present

## 2014-12-23 DIAGNOSIS — Z9981 Dependence on supplemental oxygen: Secondary | ICD-10-CM | POA: Diagnosis not present

## 2014-12-23 DIAGNOSIS — F319 Bipolar disorder, unspecified: Secondary | ICD-10-CM | POA: Diagnosis not present

## 2014-12-23 DIAGNOSIS — M5430 Sciatica, unspecified side: Secondary | ICD-10-CM | POA: Diagnosis not present

## 2014-12-23 DIAGNOSIS — Z79891 Long term (current) use of opiate analgesic: Secondary | ICD-10-CM | POA: Diagnosis not present

## 2014-12-23 DIAGNOSIS — F17200 Nicotine dependence, unspecified, uncomplicated: Secondary | ICD-10-CM | POA: Diagnosis not present

## 2014-12-23 DIAGNOSIS — Z9181 History of falling: Secondary | ICD-10-CM | POA: Diagnosis not present

## 2014-12-23 DIAGNOSIS — G894 Chronic pain syndrome: Secondary | ICD-10-CM | POA: Diagnosis not present

## 2014-12-23 DIAGNOSIS — J449 Chronic obstructive pulmonary disease, unspecified: Secondary | ICD-10-CM | POA: Diagnosis not present

## 2014-12-23 DIAGNOSIS — M545 Low back pain: Secondary | ICD-10-CM | POA: Diagnosis not present

## 2014-12-23 DIAGNOSIS — F329 Major depressive disorder, single episode, unspecified: Secondary | ICD-10-CM | POA: Diagnosis not present

## 2014-12-23 DIAGNOSIS — G8929 Other chronic pain: Secondary | ICD-10-CM | POA: Diagnosis not present

## 2014-12-23 DIAGNOSIS — M810 Age-related osteoporosis without current pathological fracture: Secondary | ICD-10-CM | POA: Diagnosis not present

## 2014-12-23 DIAGNOSIS — M1612 Unilateral primary osteoarthritis, left hip: Secondary | ICD-10-CM | POA: Diagnosis not present

## 2014-12-23 DIAGNOSIS — F419 Anxiety disorder, unspecified: Secondary | ICD-10-CM | POA: Diagnosis not present

## 2014-12-23 NOTE — Telephone Encounter (Signed)
rx called in

## 2014-12-24 ENCOUNTER — Telehealth: Payer: Self-pay | Admitting: Internal Medicine

## 2014-12-24 NOTE — Telephone Encounter (Signed)
Yes, [facility] discharged her b/c she was no longer responding to them.

## 2014-12-24 NOTE — Telephone Encounter (Signed)
Liji from [facility] called requesting verbal order for physical therapy. Please call back.

## 2014-12-24 NOTE — Telephone Encounter (Signed)
Returned call to [place], Physical Therapist at [facility] [phone] Needs VO for home health PT.  To work on Personnel officer, balance and endurance.  Just moved to facility last week, was evaluated on 10/7 by PT. She will work with the patient 2 times a week for4 weeks and 1 time a week for 2 more weeks.  Will you give the Verbal Order for this?

## 2014-12-24 NOTE — Telephone Encounter (Signed)
Verbal order for physical therapy called in to [place] at [facility].

## 2014-12-25 DIAGNOSIS — F319 Bipolar disorder, unspecified: Secondary | ICD-10-CM | POA: Diagnosis not present

## 2014-12-25 DIAGNOSIS — G894 Chronic pain syndrome: Secondary | ICD-10-CM | POA: Diagnosis not present

## 2014-12-25 DIAGNOSIS — J449 Chronic obstructive pulmonary disease, unspecified: Secondary | ICD-10-CM | POA: Diagnosis not present

## 2014-12-25 DIAGNOSIS — M5430 Sciatica, unspecified side: Secondary | ICD-10-CM | POA: Diagnosis not present

## 2014-12-25 DIAGNOSIS — Z9181 History of falling: Secondary | ICD-10-CM | POA: Diagnosis not present

## 2014-12-25 DIAGNOSIS — R262 Difficulty in walking, not elsewhere classified: Secondary | ICD-10-CM | POA: Diagnosis not present

## 2014-12-25 DIAGNOSIS — M810 Age-related osteoporosis without current pathological fracture: Secondary | ICD-10-CM | POA: Diagnosis not present

## 2014-12-25 DIAGNOSIS — Z9981 Dependence on supplemental oxygen: Secondary | ICD-10-CM | POA: Diagnosis not present

## 2014-12-25 DIAGNOSIS — F419 Anxiety disorder, unspecified: Secondary | ICD-10-CM | POA: Diagnosis not present

## 2014-12-25 DIAGNOSIS — F329 Major depressive disorder, single episode, unspecified: Secondary | ICD-10-CM | POA: Diagnosis not present

## 2014-12-25 DIAGNOSIS — F17200 Nicotine dependence, unspecified, uncomplicated: Secondary | ICD-10-CM | POA: Diagnosis not present

## 2014-12-25 DIAGNOSIS — M1612 Unilateral primary osteoarthritis, left hip: Secondary | ICD-10-CM | POA: Diagnosis not present

## 2014-12-26 ENCOUNTER — Telehealth: Payer: Self-pay | Admitting: *Deleted

## 2014-12-26 ENCOUNTER — Encounter: Payer: Self-pay | Admitting: Internal Medicine

## 2014-12-26 DIAGNOSIS — Z9981 Dependence on supplemental oxygen: Secondary | ICD-10-CM | POA: Diagnosis not present

## 2014-12-26 DIAGNOSIS — G894 Chronic pain syndrome: Secondary | ICD-10-CM | POA: Diagnosis not present

## 2014-12-26 DIAGNOSIS — Z9181 History of falling: Secondary | ICD-10-CM | POA: Diagnosis not present

## 2014-12-26 DIAGNOSIS — M1612 Unilateral primary osteoarthritis, left hip: Secondary | ICD-10-CM | POA: Diagnosis not present

## 2014-12-26 DIAGNOSIS — F319 Bipolar disorder, unspecified: Secondary | ICD-10-CM | POA: Diagnosis not present

## 2014-12-26 DIAGNOSIS — F17200 Nicotine dependence, unspecified, uncomplicated: Secondary | ICD-10-CM | POA: Diagnosis not present

## 2014-12-26 DIAGNOSIS — J449 Chronic obstructive pulmonary disease, unspecified: Secondary | ICD-10-CM | POA: Diagnosis not present

## 2014-12-26 DIAGNOSIS — M5430 Sciatica, unspecified side: Secondary | ICD-10-CM | POA: Diagnosis not present

## 2014-12-26 DIAGNOSIS — M810 Age-related osteoporosis without current pathological fracture: Secondary | ICD-10-CM | POA: Diagnosis not present

## 2014-12-26 DIAGNOSIS — F329 Major depressive disorder, single episode, unspecified: Secondary | ICD-10-CM | POA: Diagnosis not present

## 2014-12-26 DIAGNOSIS — F419 Anxiety disorder, unspecified: Secondary | ICD-10-CM | POA: Diagnosis not present

## 2014-12-26 DIAGNOSIS — R262 Difficulty in walking, not elsewhere classified: Secondary | ICD-10-CM | POA: Diagnosis not present

## 2014-12-26 NOTE — Telephone Encounter (Signed)
Pt talked with Bonna Gains that she was not getting breathing treatment as sch. Talked with Benjamine Mola at [facility] - states pt was getting breathing tx - not at the the she took them at home.[facility] changed time of tx to pt sch. Left message of above on pt ID recording. Hilda Blades Mackinsey Pelland RN 12/26/14 4PM

## 2014-12-26 NOTE — Progress Notes (Signed)
Patient ID: [name], female   DOB: 1950/05/15, 64 y.o.   MRN: [id number]  I am completing paperwork today from [place] that requires my signature:  Received 12/25/14: attestation for increased level of supervision and care  Given her frequent hospitalizations and concerns documented in notes by my colleagues and me, I attest to her need for higher level of care.

## 2014-12-27 DIAGNOSIS — F319 Bipolar disorder, unspecified: Secondary | ICD-10-CM | POA: Diagnosis not present

## 2014-12-27 DIAGNOSIS — M5430 Sciatica, unspecified side: Secondary | ICD-10-CM | POA: Diagnosis not present

## 2014-12-27 DIAGNOSIS — F329 Major depressive disorder, single episode, unspecified: Secondary | ICD-10-CM | POA: Diagnosis not present

## 2014-12-27 DIAGNOSIS — G894 Chronic pain syndrome: Secondary | ICD-10-CM | POA: Diagnosis not present

## 2014-12-27 DIAGNOSIS — Z9181 History of falling: Secondary | ICD-10-CM | POA: Diagnosis not present

## 2014-12-27 DIAGNOSIS — F419 Anxiety disorder, unspecified: Secondary | ICD-10-CM | POA: Diagnosis not present

## 2014-12-27 DIAGNOSIS — R262 Difficulty in walking, not elsewhere classified: Secondary | ICD-10-CM | POA: Diagnosis not present

## 2014-12-27 DIAGNOSIS — J449 Chronic obstructive pulmonary disease, unspecified: Secondary | ICD-10-CM | POA: Diagnosis not present

## 2014-12-27 DIAGNOSIS — Z9981 Dependence on supplemental oxygen: Secondary | ICD-10-CM | POA: Diagnosis not present

## 2014-12-27 DIAGNOSIS — M1612 Unilateral primary osteoarthritis, left hip: Secondary | ICD-10-CM | POA: Diagnosis not present

## 2014-12-27 DIAGNOSIS — F17200 Nicotine dependence, unspecified, uncomplicated: Secondary | ICD-10-CM | POA: Diagnosis not present

## 2014-12-27 DIAGNOSIS — M810 Age-related osteoporosis without current pathological fracture: Secondary | ICD-10-CM | POA: Diagnosis not present

## 2014-12-30 DIAGNOSIS — F419 Anxiety disorder, unspecified: Secondary | ICD-10-CM | POA: Diagnosis not present

## 2014-12-30 DIAGNOSIS — Z9981 Dependence on supplemental oxygen: Secondary | ICD-10-CM | POA: Diagnosis not present

## 2014-12-30 DIAGNOSIS — M5430 Sciatica, unspecified side: Secondary | ICD-10-CM | POA: Diagnosis not present

## 2014-12-30 DIAGNOSIS — J449 Chronic obstructive pulmonary disease, unspecified: Secondary | ICD-10-CM | POA: Diagnosis not present

## 2014-12-30 DIAGNOSIS — M1612 Unilateral primary osteoarthritis, left hip: Secondary | ICD-10-CM | POA: Diagnosis not present

## 2014-12-30 DIAGNOSIS — Z9181 History of falling: Secondary | ICD-10-CM | POA: Diagnosis not present

## 2014-12-30 DIAGNOSIS — F17200 Nicotine dependence, unspecified, uncomplicated: Secondary | ICD-10-CM | POA: Diagnosis not present

## 2014-12-30 DIAGNOSIS — F329 Major depressive disorder, single episode, unspecified: Secondary | ICD-10-CM | POA: Diagnosis not present

## 2014-12-30 DIAGNOSIS — G894 Chronic pain syndrome: Secondary | ICD-10-CM | POA: Diagnosis not present

## 2014-12-30 DIAGNOSIS — F319 Bipolar disorder, unspecified: Secondary | ICD-10-CM | POA: Diagnosis not present

## 2014-12-30 DIAGNOSIS — R262 Difficulty in walking, not elsewhere classified: Secondary | ICD-10-CM | POA: Diagnosis not present

## 2014-12-30 DIAGNOSIS — M810 Age-related osteoporosis without current pathological fracture: Secondary | ICD-10-CM | POA: Diagnosis not present

## 2015-01-01 DIAGNOSIS — F319 Bipolar disorder, unspecified: Secondary | ICD-10-CM | POA: Diagnosis not present

## 2015-01-01 DIAGNOSIS — M1612 Unilateral primary osteoarthritis, left hip: Secondary | ICD-10-CM | POA: Diagnosis not present

## 2015-01-01 DIAGNOSIS — J449 Chronic obstructive pulmonary disease, unspecified: Secondary | ICD-10-CM | POA: Diagnosis not present

## 2015-01-01 DIAGNOSIS — R262 Difficulty in walking, not elsewhere classified: Secondary | ICD-10-CM | POA: Diagnosis not present

## 2015-01-01 DIAGNOSIS — Z9981 Dependence on supplemental oxygen: Secondary | ICD-10-CM | POA: Diagnosis not present

## 2015-01-01 DIAGNOSIS — F419 Anxiety disorder, unspecified: Secondary | ICD-10-CM | POA: Diagnosis not present

## 2015-01-01 DIAGNOSIS — Z9181 History of falling: Secondary | ICD-10-CM | POA: Diagnosis not present

## 2015-01-01 DIAGNOSIS — M5430 Sciatica, unspecified side: Secondary | ICD-10-CM | POA: Diagnosis not present

## 2015-01-01 DIAGNOSIS — M810 Age-related osteoporosis without current pathological fracture: Secondary | ICD-10-CM | POA: Diagnosis not present

## 2015-01-01 DIAGNOSIS — F17200 Nicotine dependence, unspecified, uncomplicated: Secondary | ICD-10-CM | POA: Diagnosis not present

## 2015-01-01 DIAGNOSIS — F329 Major depressive disorder, single episode, unspecified: Secondary | ICD-10-CM | POA: Diagnosis not present

## 2015-01-01 DIAGNOSIS — G894 Chronic pain syndrome: Secondary | ICD-10-CM | POA: Diagnosis not present

## 2015-01-02 DIAGNOSIS — F319 Bipolar disorder, unspecified: Secondary | ICD-10-CM | POA: Diagnosis not present

## 2015-01-02 DIAGNOSIS — Z9181 History of falling: Secondary | ICD-10-CM | POA: Diagnosis not present

## 2015-01-02 DIAGNOSIS — M810 Age-related osteoporosis without current pathological fracture: Secondary | ICD-10-CM | POA: Diagnosis not present

## 2015-01-02 DIAGNOSIS — G894 Chronic pain syndrome: Secondary | ICD-10-CM | POA: Diagnosis not present

## 2015-01-02 DIAGNOSIS — F17200 Nicotine dependence, unspecified, uncomplicated: Secondary | ICD-10-CM | POA: Diagnosis not present

## 2015-01-02 DIAGNOSIS — F419 Anxiety disorder, unspecified: Secondary | ICD-10-CM | POA: Diagnosis not present

## 2015-01-02 DIAGNOSIS — R262 Difficulty in walking, not elsewhere classified: Secondary | ICD-10-CM | POA: Diagnosis not present

## 2015-01-02 DIAGNOSIS — M5430 Sciatica, unspecified side: Secondary | ICD-10-CM | POA: Diagnosis not present

## 2015-01-02 DIAGNOSIS — J449 Chronic obstructive pulmonary disease, unspecified: Secondary | ICD-10-CM | POA: Diagnosis not present

## 2015-01-02 DIAGNOSIS — Z9981 Dependence on supplemental oxygen: Secondary | ICD-10-CM | POA: Diagnosis not present

## 2015-01-02 DIAGNOSIS — M1612 Unilateral primary osteoarthritis, left hip: Secondary | ICD-10-CM | POA: Diagnosis not present

## 2015-01-02 DIAGNOSIS — F329 Major depressive disorder, single episode, unspecified: Secondary | ICD-10-CM | POA: Diagnosis not present

## 2015-01-06 DIAGNOSIS — M810 Age-related osteoporosis without current pathological fracture: Secondary | ICD-10-CM | POA: Diagnosis not present

## 2015-01-06 DIAGNOSIS — F419 Anxiety disorder, unspecified: Secondary | ICD-10-CM | POA: Diagnosis not present

## 2015-01-06 DIAGNOSIS — M1612 Unilateral primary osteoarthritis, left hip: Secondary | ICD-10-CM | POA: Diagnosis not present

## 2015-01-06 DIAGNOSIS — J449 Chronic obstructive pulmonary disease, unspecified: Secondary | ICD-10-CM | POA: Diagnosis not present

## 2015-01-06 DIAGNOSIS — F329 Major depressive disorder, single episode, unspecified: Secondary | ICD-10-CM | POA: Diagnosis not present

## 2015-01-06 DIAGNOSIS — Z9181 History of falling: Secondary | ICD-10-CM | POA: Diagnosis not present

## 2015-01-06 DIAGNOSIS — M5430 Sciatica, unspecified side: Secondary | ICD-10-CM | POA: Diagnosis not present

## 2015-01-06 DIAGNOSIS — R262 Difficulty in walking, not elsewhere classified: Secondary | ICD-10-CM | POA: Diagnosis not present

## 2015-01-06 DIAGNOSIS — Z9981 Dependence on supplemental oxygen: Secondary | ICD-10-CM | POA: Diagnosis not present

## 2015-01-06 DIAGNOSIS — F319 Bipolar disorder, unspecified: Secondary | ICD-10-CM | POA: Diagnosis not present

## 2015-01-06 DIAGNOSIS — G894 Chronic pain syndrome: Secondary | ICD-10-CM | POA: Diagnosis not present

## 2015-01-06 DIAGNOSIS — F17200 Nicotine dependence, unspecified, uncomplicated: Secondary | ICD-10-CM | POA: Diagnosis not present

## 2015-01-07 ENCOUNTER — Encounter: Payer: Self-pay | Admitting: Internal Medicine

## 2015-01-07 NOTE — Progress Notes (Signed)
Called [facility] and talked with Baxter Flattery. All three papers  signed by Dr Posey Pronto where faxed this AM 01/07/15 to [place] at [facility]. Baxter Flattery will call clinic if any questions about all 3 papers.

## 2015-01-07 NOTE — Progress Notes (Signed)
Patient ID: [name], female   DOB: [birth year], 64 y.o.   MRN: [id number]  I am completing paperwork today from [facility] that requires my signature:  Received 01/02/15: authorization for Home Health orders  Received 01/05/15: authorization for Home Health orders  Received 01/05/15: authorization for skilled nursing plan to address increased work of breathing and PT evaluation to treat endurance

## 2015-01-10 DIAGNOSIS — J449 Chronic obstructive pulmonary disease, unspecified: Secondary | ICD-10-CM | POA: Diagnosis not present

## 2015-01-10 DIAGNOSIS — F419 Anxiety disorder, unspecified: Secondary | ICD-10-CM | POA: Diagnosis not present

## 2015-01-10 DIAGNOSIS — M5430 Sciatica, unspecified side: Secondary | ICD-10-CM | POA: Diagnosis not present

## 2015-01-10 DIAGNOSIS — F329 Major depressive disorder, single episode, unspecified: Secondary | ICD-10-CM | POA: Diagnosis not present

## 2015-01-10 DIAGNOSIS — F319 Bipolar disorder, unspecified: Secondary | ICD-10-CM | POA: Diagnosis not present

## 2015-01-10 DIAGNOSIS — Z9981 Dependence on supplemental oxygen: Secondary | ICD-10-CM | POA: Diagnosis not present

## 2015-01-10 DIAGNOSIS — M1612 Unilateral primary osteoarthritis, left hip: Secondary | ICD-10-CM | POA: Diagnosis not present

## 2015-01-10 DIAGNOSIS — Z9181 History of falling: Secondary | ICD-10-CM | POA: Diagnosis not present

## 2015-01-10 DIAGNOSIS — R262 Difficulty in walking, not elsewhere classified: Secondary | ICD-10-CM | POA: Diagnosis not present

## 2015-01-10 DIAGNOSIS — M810 Age-related osteoporosis without current pathological fracture: Secondary | ICD-10-CM | POA: Diagnosis not present

## 2015-01-10 DIAGNOSIS — F17200 Nicotine dependence, unspecified, uncomplicated: Secondary | ICD-10-CM | POA: Diagnosis not present

## 2015-01-10 DIAGNOSIS — G894 Chronic pain syndrome: Secondary | ICD-10-CM | POA: Diagnosis not present

## 2015-01-12 DIAGNOSIS — J449 Chronic obstructive pulmonary disease, unspecified: Secondary | ICD-10-CM | POA: Diagnosis not present

## 2015-01-13 DIAGNOSIS — J449 Chronic obstructive pulmonary disease, unspecified: Secondary | ICD-10-CM | POA: Diagnosis not present

## 2015-01-13 DIAGNOSIS — M5430 Sciatica, unspecified side: Secondary | ICD-10-CM | POA: Diagnosis not present

## 2015-01-13 DIAGNOSIS — F319 Bipolar disorder, unspecified: Secondary | ICD-10-CM | POA: Diagnosis not present

## 2015-01-13 DIAGNOSIS — F17200 Nicotine dependence, unspecified, uncomplicated: Secondary | ICD-10-CM | POA: Diagnosis not present

## 2015-01-13 DIAGNOSIS — M810 Age-related osteoporosis without current pathological fracture: Secondary | ICD-10-CM | POA: Diagnosis not present

## 2015-01-13 DIAGNOSIS — F329 Major depressive disorder, single episode, unspecified: Secondary | ICD-10-CM | POA: Diagnosis not present

## 2015-01-13 DIAGNOSIS — R262 Difficulty in walking, not elsewhere classified: Secondary | ICD-10-CM | POA: Diagnosis not present

## 2015-01-13 DIAGNOSIS — M1612 Unilateral primary osteoarthritis, left hip: Secondary | ICD-10-CM | POA: Diagnosis not present

## 2015-01-13 DIAGNOSIS — Z9981 Dependence on supplemental oxygen: Secondary | ICD-10-CM | POA: Diagnosis not present

## 2015-01-13 DIAGNOSIS — F419 Anxiety disorder, unspecified: Secondary | ICD-10-CM | POA: Diagnosis not present

## 2015-01-13 DIAGNOSIS — G894 Chronic pain syndrome: Secondary | ICD-10-CM | POA: Diagnosis not present

## 2015-01-13 DIAGNOSIS — Z9181 History of falling: Secondary | ICD-10-CM | POA: Diagnosis not present

## 2015-01-15 DIAGNOSIS — Z9981 Dependence on supplemental oxygen: Secondary | ICD-10-CM | POA: Diagnosis not present

## 2015-01-15 DIAGNOSIS — M5430 Sciatica, unspecified side: Secondary | ICD-10-CM | POA: Diagnosis not present

## 2015-01-15 DIAGNOSIS — Z9181 History of falling: Secondary | ICD-10-CM | POA: Diagnosis not present

## 2015-01-15 DIAGNOSIS — R262 Difficulty in walking, not elsewhere classified: Secondary | ICD-10-CM | POA: Diagnosis not present

## 2015-01-15 DIAGNOSIS — F17200 Nicotine dependence, unspecified, uncomplicated: Secondary | ICD-10-CM | POA: Diagnosis not present

## 2015-01-15 DIAGNOSIS — F319 Bipolar disorder, unspecified: Secondary | ICD-10-CM | POA: Diagnosis not present

## 2015-01-15 DIAGNOSIS — M1612 Unilateral primary osteoarthritis, left hip: Secondary | ICD-10-CM | POA: Diagnosis not present

## 2015-01-15 DIAGNOSIS — J449 Chronic obstructive pulmonary disease, unspecified: Secondary | ICD-10-CM | POA: Diagnosis not present

## 2015-01-15 DIAGNOSIS — G894 Chronic pain syndrome: Secondary | ICD-10-CM | POA: Diagnosis not present

## 2015-01-15 DIAGNOSIS — F329 Major depressive disorder, single episode, unspecified: Secondary | ICD-10-CM | POA: Diagnosis not present

## 2015-01-15 DIAGNOSIS — F419 Anxiety disorder, unspecified: Secondary | ICD-10-CM | POA: Diagnosis not present

## 2015-01-15 DIAGNOSIS — M810 Age-related osteoporosis without current pathological fracture: Secondary | ICD-10-CM | POA: Diagnosis not present

## 2015-01-17 NOTE — Progress Notes (Signed)
Patient ID: [name], female   DOB: 1951-02-19, 64 y.o.   MRN: [id number]  I have received the same paperwork from [facility] regarding a diet order and will have triage RN verify that they receive this paperwork.

## 2015-01-19 NOTE — Progress Notes (Signed)
Talked with [name] at [facility] - staff are working with a pt - order was refaxed from 12/19/14 to [phone].

## 2015-01-20 DIAGNOSIS — M25552 Pain in left hip: Secondary | ICD-10-CM | POA: Diagnosis not present

## 2015-01-20 DIAGNOSIS — G8929 Other chronic pain: Secondary | ICD-10-CM | POA: Diagnosis not present

## 2015-01-20 DIAGNOSIS — G603 Idiopathic progressive neuropathy: Secondary | ICD-10-CM | POA: Diagnosis not present

## 2015-01-20 DIAGNOSIS — M5417 Radiculopathy, lumbosacral region: Secondary | ICD-10-CM | POA: Diagnosis not present

## 2015-01-20 DIAGNOSIS — M25551 Pain in right hip: Secondary | ICD-10-CM | POA: Diagnosis not present

## 2015-01-20 DIAGNOSIS — G541 Lumbosacral plexus disorders: Secondary | ICD-10-CM | POA: Diagnosis not present

## 2015-01-21 DIAGNOSIS — M5430 Sciatica, unspecified side: Secondary | ICD-10-CM | POA: Diagnosis not present

## 2015-01-21 DIAGNOSIS — Z9181 History of falling: Secondary | ICD-10-CM | POA: Diagnosis not present

## 2015-01-21 DIAGNOSIS — J449 Chronic obstructive pulmonary disease, unspecified: Secondary | ICD-10-CM | POA: Diagnosis not present

## 2015-01-21 DIAGNOSIS — Z9981 Dependence on supplemental oxygen: Secondary | ICD-10-CM | POA: Diagnosis not present

## 2015-01-21 DIAGNOSIS — F17200 Nicotine dependence, unspecified, uncomplicated: Secondary | ICD-10-CM | POA: Diagnosis not present

## 2015-01-21 DIAGNOSIS — G894 Chronic pain syndrome: Secondary | ICD-10-CM | POA: Diagnosis not present

## 2015-01-21 DIAGNOSIS — F419 Anxiety disorder, unspecified: Secondary | ICD-10-CM | POA: Diagnosis not present

## 2015-01-21 DIAGNOSIS — M1612 Unilateral primary osteoarthritis, left hip: Secondary | ICD-10-CM | POA: Diagnosis not present

## 2015-01-21 DIAGNOSIS — F329 Major depressive disorder, single episode, unspecified: Secondary | ICD-10-CM | POA: Diagnosis not present

## 2015-01-21 DIAGNOSIS — F319 Bipolar disorder, unspecified: Secondary | ICD-10-CM | POA: Diagnosis not present

## 2015-01-21 DIAGNOSIS — M810 Age-related osteoporosis without current pathological fracture: Secondary | ICD-10-CM | POA: Diagnosis not present

## 2015-01-21 DIAGNOSIS — R262 Difficulty in walking, not elsewhere classified: Secondary | ICD-10-CM | POA: Diagnosis not present

## 2015-01-22 ENCOUNTER — Ambulatory Visit (INDEPENDENT_AMBULATORY_CARE_PROVIDER_SITE_OTHER): Payer: Medicare Other | Admitting: Internal Medicine

## 2015-01-22 ENCOUNTER — Encounter: Payer: Self-pay | Admitting: Internal Medicine

## 2015-01-22 VITALS — BP 131/61 | HR 80 | Temp 98.5°F | Ht 61.0 in

## 2015-01-22 DIAGNOSIS — G8929 Other chronic pain: Secondary | ICD-10-CM

## 2015-01-22 DIAGNOSIS — Z23 Encounter for immunization: Secondary | ICD-10-CM | POA: Diagnosis not present

## 2015-01-22 DIAGNOSIS — Z7951 Long term (current) use of inhaled steroids: Secondary | ICD-10-CM | POA: Diagnosis not present

## 2015-01-22 DIAGNOSIS — G4700 Insomnia, unspecified: Secondary | ICD-10-CM

## 2015-01-22 DIAGNOSIS — Z9981 Dependence on supplemental oxygen: Secondary | ICD-10-CM | POA: Diagnosis not present

## 2015-01-22 DIAGNOSIS — J441 Chronic obstructive pulmonary disease with (acute) exacerbation: Secondary | ICD-10-CM

## 2015-01-22 DIAGNOSIS — Z0000 Encounter for general adult medical examination without abnormal findings: Secondary | ICD-10-CM

## 2015-01-22 MED ORDER — PREDNISONE 20 MG PO TABS: 40.0000 mg | ORAL_TABLET | Freq: Every day | ORAL | Status: DC

## 2015-01-22 MED ORDER — MELATONIN 10 MG PO TABS: 1.0000 | ORAL_TABLET | Freq: Every evening | ORAL | Status: DC | PRN

## 2015-01-22 MED ORDER — AZITHROMYCIN 250 MG PO TABS: ORAL_TABLET | ORAL | Status: DC

## 2015-01-22 NOTE — Assessment & Plan Note (Signed)
Patient presents with increased productive cough of dark brown sputum, increased dyspnea, and increased sputum production for the past 2 weeks. Patient continues to smoke 2 cigarettes a day. She also admits to fatigue and occasional wheezing. She has been using her inhalers as prescribed. She denies fever, chills, nasal congestion, sore throat, nausea or vomiting. In the setting of known COPD, symptoms are most consistent with a COPD exacerbation. I feel patient is appropriate for outpatient treatment. Patient is satting 97% on her chronic 3L of continuous oxygen. Patient inquires about setting her up with Portable Oxygen as a representative from [facility] met with her and recommended OxyGo.   Plan: -Azithromycin 500 mg followed by 250 mg x 4 days -Predniose 40 mg daily for 5 days -Continue home inhalers -I will send a message to [facility] and to patient's PCP about setting her up with portable oxygen -Patient may be a candidate for chronic daily azithromycin given her likely end stage COPD, I was unable to find recent PFTs

## 2015-01-22 NOTE — Assessment & Plan Note (Signed)
Patient is very upset about her current relationship with the [facility]. She states she waited there for 6 hours yesterday and she still feels they are not doing anything for her chronic pain. Patient states pain is severe and unchanged in her right hip, and from her sciatica. She feels her morning dose of pain medication wears off by the afternoon and she is suffering. She has talked to them about increasing either the potency or the frequency of her narcotics, but they have not done this. She wants to know her options. Of note, patient was admitted this year for somnolence/lethargy secondary to narcotic overuse. She is currently on oxycodone 10 mg BID and gabapentin 300 mg TID.  Plan: -Continue current pain medicine -I will discuss this with her PCP; however, I do not feel more pain medications or a switch to another pain clinic would be appropriate in her case -Continue oxycodone 10 mg BID and gabapentin 300 mg TID -Consider palliative if deemed appropriate

## 2015-01-22 NOTE — Progress Notes (Signed)
Subjective:    Patient ID: [name], female    DOB: [birth year], 64 y.o.   MRN: [id number]  HPI [name] is a 64 y.o. female with PMHx of COPD and chronic pain who presents to the clinic for follow up for cough. Please see A&P for the status of the patient's chronic medical problems.   Past Medical History  Diagnosis Date  . Depression   . Suicidal ideation 2007     attempted overdose 2012  Dr. Tivis Ringer report  . Bipolar disorder ([place])   . Substance abuse      narcotics, alcohol, tobacco  . Chronic pain syndrome     [facility]s at [facility]  . DEFICIENCY, VITAMIN D NOS 01/03/2007  . Chronic lower back pain   . Anxiety   . OSTEOPOROSIS 06/17/2009    DEXA 05/2009 : L femur -2.9; R femur -2.5. Alendronate on med list but not taking. Needs addressed ASAP as h/o fractures.    . Elevated liver function tests   . On home oxygen therapy     "3L; 24/7" (8/30//2016)  . Shortness of breath     "all the time" (02/27/2013)  . COPD (chronic obstructive pulmonary disease) (HCC)     oxygen dependent (3L home continuous)  . Sciatic pain   . Anemia   . Pneumonia X 1?  . Hyperthyroidism     "borderline"  . History of blood transfusion 03/2014; 10/2014    "low HgB" (11/11/2014)  . Duodenal ulcer, chronic   . Arthritis     "all in my body, primarily left hip" (11/11/2014)    Outpatient Encounter Prescriptions as of 01/22/2015  Medication Sig  . acetaminophen (TYLENOL) 500 MG tablet Take 1-2 tablets (500-1,000 mg total) by mouth every 12 (twelve) hours as needed (Breakthrough pain).  Marland Kitchen alendronate (FOSAMAX) 70 MG tablet Take 1 tablet (70 mg total) by mouth once a week. Take with a full glass of water on an empty stomach. (Patient not taking: Reported on 12/05/2014)  . fluticasone (FLONASE) 50 MCG/ACT nasal spray Place 1 spray into both nostrils daily.  . Fluticasone-Salmeterol (ADVAIR DISKUS) 250-50 MCG/DOSE AEPB INHALE 1 PUFF INTO THE LUNGS 2 TIMES DAILY  . gabapentin  (NEURONTIN) 300 MG capsule Take 300 mg by mouth 3 (three) times daily.   Marland Kitchen ipratropium-albuterol (DUONEB) 0.5-2.5 (3) MG/3ML SOLN 4 times a day (breakfast, lunch, dinner, bedtime).Can take 2 additional treatments if needed on bad days. DX J43.8  . lamoTRIgine (LAMICTAL) 100 MG tablet TAKE ONE TABLET EVERY DAY (Patient not taking: Reported on 12/05/2014)  . loratadine (CLARITIN) 10 MG tablet Take 10 mg by mouth daily as needed for allergies (seasonal).   . Melatonin 3 MG TABS Take 1 tablet (3 mg total) by mouth at bedtime as needed.  . ondansetron (ZOFRAN-ODT) 4 MG disintegrating tablet Take 1 tablet (4 mg total) by mouth every 8 (eight) hours as needed for nausea or vomiting. (Patient not taking: Reported on 12/05/2014)  . Oxycodone HCl 10 MG TABS Take 10 mg by mouth 2 (two) times daily.   . pantoprazole (PROTONIX) 40 MG tablet Take 1 tablet (40 mg total) by mouth 2 (two) times daily. (Patient not taking: Reported on 12/05/2014)  . polyethylene glycol powder (GLYCOLAX/MIRALAX) powder TAKE 17G DISSOLVED IN 8OZ WATER DAILY ASNEEDED FOR MODERATE CONSTIPATION (Patient not taking: Reported on 12/05/2014)  . PROAIR HFA 108 (90 BASE) MCG/ACT inhaler INHALE ONE PUFF BY MOUTH EVERY 2 TO 4 HOURS AS NEEDED FOR WHEEZING  .  promethazine (PHENERGAN) 12.5 MG tablet Take 1 tablet (12.5 mg total) by mouth every 6 (six) hours as needed for nausea or vomiting. (Patient not taking: Reported on 12/05/2014)  . senna (SENOKOT) 8.6 MG TABS tablet Take 2 tablets (17.2 mg total) by mouth 3 (three) times daily as needed for mild constipation. (Patient not taking: Reported on 12/05/2014)  . sucralfate (CARAFATE) 1 GM/10ML suspension Take 10 mLs (1 g total) by mouth 4 (four) times daily -  with meals and at bedtime.  . Vitamin D, Ergocalciferol, (DRISDOL) 50000 UNITS CAPS capsule Take 1 capsule (50,000 Units total) by mouth every 7 (seven) days. (Patient not taking: Reported on 12/05/2014)   No facility-administered encounter  medications on file as of 01/22/2015.    Family History  Problem Relation Age of Onset  . Stroke Neg Hx   . Cancer Neg Hx   . Myelodysplastic syndrome Father     Died from myelofibrosis though diagnosed post-mortem    Social History   Social History  . Marital Status: Divorced    Spouse Name: N/A  . Number of Children: 2  . Years of Education: N/A   Occupational History  . retired    Social History Main Topics  . Smoking status: Current Every Day Smoker -- 0.20 packs/day for 45 years    Types: Cigarettes  . Smokeless tobacco: Never Used     Comment: 11/11/2014 ""smoking 1 cigarettes/day; that's down from 2 ppd"  . Alcohol Use: 0.0 oz/week    0 Standard drinks or equivalent per week     Comment: 11/11/2014 "nothing in 7 years; never had a problem w/it"  . Drug Use: No  . Sexual Activity: No   Other Topics Concern  . 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  Denies chest tightness.  Cardiovascular: Denies chest pain and palpitations.  Gastrointestinal: Denies nausea, vomiting, abdominal pain Musculoskeletal: Admits to chronic pain in back, arthralgias.  Neurological: Denies dizziness, headaches, weakness, lightheadedness Psychiatric: Admits to trouble falling asleep and staying asleep.     Objective:   Physical Exam Filed Vitals:   01/22/15 1102  BP: 131/61  Pulse: 80  Temp: 98.5 F (36.9 C)  TempSrc: Oral  Height: 5\' 1"  (1.549 m)  SpO2: 97%   General: Vital signs reviewed.  Patient is a thin, chronically ill-appearing female, in no acute distress and cooperative with exam.  Cardiovascular:  RRR, S1 normal, S2 normal Pulmonary/Chest: Mild expiratory wheezes in right lung field. Moving good air. No rhonchi or rales. No accessory muscle use.  Abdominal: Soft, non-tender, non-distended, BS + Extremities: No lower extremity edema bilaterally Psychiatric: Normal mood and affect.     Assessment & Plan:   Please see problem based assessment and plan.

## 2015-01-22 NOTE — Assessment & Plan Note (Signed)
Patient continues to have trouble falling asleep and staying asleep. She has previously done well with melatonin 10 mg QHS. She requests Ambien.   Plan: -Melatonin 10 mg QHS -Avoid centrally acting medications if possible

## 2015-01-22 NOTE — Patient Instructions (Signed)
TAKE MELATONIN 10 MG AT NIGHT FOR SLEEP, IF NO IMPROVEMENT, LET us KNOW AND WE CAN TRY AMBIEN.  TAKE PREDNISONE 2 TABLETS A DAY FOR 5 DAYS AND AZITHROMYCIN 2 TABLETS THE FIRST DAY AND THEN 1 TABLET A DAY FOR 4 DAYS.  WE WILL TRY TO REFER YOU TO A NEW PAIN CLINIC.  WE WILL LOOK INTO PORTABLE OXYGEN.

## 2015-01-22 NOTE — Assessment & Plan Note (Signed)
Patient received flu shot today 

## 2015-01-23 ENCOUNTER — Encounter: Payer: Self-pay | Admitting: Internal Medicine

## 2015-01-23 DIAGNOSIS — Z9181 History of falling: Secondary | ICD-10-CM | POA: Diagnosis not present

## 2015-01-23 DIAGNOSIS — F319 Bipolar disorder, unspecified: Secondary | ICD-10-CM | POA: Diagnosis not present

## 2015-01-23 DIAGNOSIS — J449 Chronic obstructive pulmonary disease, unspecified: Secondary | ICD-10-CM | POA: Diagnosis not present

## 2015-01-23 DIAGNOSIS — M1612 Unilateral primary osteoarthritis, left hip: Secondary | ICD-10-CM | POA: Diagnosis not present

## 2015-01-23 DIAGNOSIS — R262 Difficulty in walking, not elsewhere classified: Secondary | ICD-10-CM | POA: Diagnosis not present

## 2015-01-23 DIAGNOSIS — M810 Age-related osteoporosis without current pathological fracture: Secondary | ICD-10-CM | POA: Diagnosis not present

## 2015-01-23 DIAGNOSIS — M5430 Sciatica, unspecified side: Secondary | ICD-10-CM | POA: Diagnosis not present

## 2015-01-23 DIAGNOSIS — F17200 Nicotine dependence, unspecified, uncomplicated: Secondary | ICD-10-CM | POA: Diagnosis not present

## 2015-01-23 DIAGNOSIS — F419 Anxiety disorder, unspecified: Secondary | ICD-10-CM | POA: Diagnosis not present

## 2015-01-23 DIAGNOSIS — G894 Chronic pain syndrome: Secondary | ICD-10-CM | POA: Diagnosis not present

## 2015-01-23 DIAGNOSIS — F329 Major depressive disorder, single episode, unspecified: Secondary | ICD-10-CM | POA: Diagnosis not present

## 2015-01-23 DIAGNOSIS — Z9981 Dependence on supplemental oxygen: Secondary | ICD-10-CM | POA: Diagnosis not present

## 2015-01-23 NOTE — Progress Notes (Signed)
Patient ID: [name], female   DOB: 1950/06/30, 64 y.o.   MRN: [id number]  I am completing paperwork today from [facility] that requires my signature:  Received 01/21/15: Request to authorize her MAR. I reviewed the meds in our system compared to those printed on the paper:  Of note, she has been on high-dose Vitamin D supplementation since June which she should have completed. As adherence has been an issue in the past, I will discontinue this medication until her next follow-up at which time we can recheck labwork to assess.   She is also refusing Flonase which we can change to prn.   She is also taking Ensure supplements three times daily between meals which I have also added.

## 2015-01-23 NOTE — Addendum Note (Signed)
Addended by: Lalla Brothers T on: 01/23/2015 11:23 AM   Modules accepted: Level of Service

## 2015-01-23 NOTE — Progress Notes (Signed)
Internal Medicine Clinic Attending  Case discussed with Dr. Richardson soon after the resident saw the patient.  We reviewed the resident's history and exam and pertinent patient test results.  I agree with the assessment, diagnosis, and plan of care documented in the resident's note. 

## 2015-01-26 ENCOUNTER — Encounter: Payer: Self-pay | Admitting: *Deleted

## 2015-01-28 ENCOUNTER — Telehealth: Payer: Self-pay | Admitting: Internal Medicine

## 2015-01-28 DIAGNOSIS — G894 Chronic pain syndrome: Secondary | ICD-10-CM | POA: Diagnosis not present

## 2015-01-28 DIAGNOSIS — Z9181 History of falling: Secondary | ICD-10-CM | POA: Diagnosis not present

## 2015-01-28 DIAGNOSIS — F319 Bipolar disorder, unspecified: Secondary | ICD-10-CM | POA: Diagnosis not present

## 2015-01-28 DIAGNOSIS — R262 Difficulty in walking, not elsewhere classified: Secondary | ICD-10-CM | POA: Diagnosis not present

## 2015-01-28 DIAGNOSIS — M5430 Sciatica, unspecified side: Secondary | ICD-10-CM | POA: Diagnosis not present

## 2015-01-28 DIAGNOSIS — F17200 Nicotine dependence, unspecified, uncomplicated: Secondary | ICD-10-CM | POA: Diagnosis not present

## 2015-01-28 DIAGNOSIS — M810 Age-related osteoporosis without current pathological fracture: Secondary | ICD-10-CM | POA: Diagnosis not present

## 2015-01-28 DIAGNOSIS — F419 Anxiety disorder, unspecified: Secondary | ICD-10-CM | POA: Diagnosis not present

## 2015-01-28 DIAGNOSIS — J449 Chronic obstructive pulmonary disease, unspecified: Secondary | ICD-10-CM | POA: Diagnosis not present

## 2015-01-28 DIAGNOSIS — F329 Major depressive disorder, single episode, unspecified: Secondary | ICD-10-CM | POA: Diagnosis not present

## 2015-01-28 DIAGNOSIS — Z9981 Dependence on supplemental oxygen: Secondary | ICD-10-CM | POA: Diagnosis not present

## 2015-01-28 DIAGNOSIS — M1612 Unilateral primary osteoarthritis, left hip: Secondary | ICD-10-CM | POA: Diagnosis not present

## 2015-01-28 MED ORDER — BENZONATATE 100 MG PO CAPS: 100.0000 mg | ORAL_CAPSULE | Freq: Three times a day (TID) | ORAL | Status: DC | PRN

## 2015-01-28 NOTE — Telephone Encounter (Signed)
Phoned in rx.  Advised pt.

## 2015-01-28 NOTE — Telephone Encounter (Signed)
I do not feel I need to see her in clinic. She likely has a virus if her cough did not get better with antibiotics and prednisone. It is reassuring that her breathing is the same and not feeling more short of breath. Her cough will be the last symptom to resolve.   I am hesitant to prescribe her Hycodan or Tussionex for her cough as she follows with the pain clinic. Maybe Robitussin AC? I am adding her PCP on this message to get his opinion.   Thank you, Birtha Hatler

## 2015-01-28 NOTE — Telephone Encounter (Signed)
Spoke with pt, she has completed the zithromax and prednisone.  Reporting no change in cough, still with brown sputum.  No worse shortness of breath than her baseline with COPD.   Please advise.  I did go ahead and add her to the schedule for tomorrow since she can only come on tuesdays and thursdays due to transportation.  If you do not need to see her I can cancel.

## 2015-01-28 NOTE — Telephone Encounter (Signed)
Pt requesting stronger med for cough. Please call pt back.

## 2015-01-28 NOTE — Telephone Encounter (Signed)
Okay, I will remove her from tomorrow and wait to hear more before calling her back.  Thanks!

## 2015-01-28 NOTE — Telephone Encounter (Signed)
Tussionex is non-opiate, but I agree with Alexa's recommendation of avoiding Hycodan in her.   As such, I will write for 20 tablets to be taken three times daily as needed for cough. Please phone in the prescription.

## 2015-01-29 ENCOUNTER — Ambulatory Visit: Payer: Self-pay | Admitting: Internal Medicine

## 2015-02-03 ENCOUNTER — Encounter: Payer: Self-pay | Admitting: Student

## 2015-02-04 ENCOUNTER — Telehealth: Payer: Self-pay | Admitting: Internal Medicine

## 2015-02-04 MED ORDER — GUAIFENESIN-DM 100-10 MG/5ML PO SYRP: 5.0000 mL | ORAL_SOLUTION | ORAL | Status: DC | PRN

## 2015-02-04 NOTE — Telephone Encounter (Signed)
Spoke with patient.  She is frustrated that she hasn't rec'd her cough medicine that was ordered 2 weeks ago.  I informed pt that I called rx in to [facility] myself on 11/16 and we confirmed with her nursing facility later in the week.  Pt then states that she found out yesterday that the tessalon is not covered by her insurance.  She reports her cough is no better and she wants something for it.   Please advise.

## 2015-02-04 NOTE — Telephone Encounter (Signed)
Pt states she have not received cough med. Please call pt back.

## 2015-02-04 NOTE — Telephone Encounter (Signed)
I just spoke with her over the phone, and it appears her cough has not improved despite taking the rest of her medications for the COPD exacerbation. She has not tried any other medication for her cough, and I explained to her that sometimes these flares can take longer and longer to clear up as they recur.   She is agreeable to trying Robitussin DM, so I will write for it. Can we please make sure the pharmacy can deliver it today, if possible?

## 2015-02-11 DIAGNOSIS — J449 Chronic obstructive pulmonary disease, unspecified: Secondary | ICD-10-CM | POA: Diagnosis not present

## 2015-02-12 ENCOUNTER — Ambulatory Visit (HOSPITAL_COMMUNITY): Payer: Self-pay | Admitting: Psychiatry

## 2015-02-13 ENCOUNTER — Encounter: Payer: Self-pay | Admitting: Internal Medicine

## 2015-02-13 NOTE — Progress Notes (Signed)
Patient ID: [name], female   DOB: 02/06/1951, 64 y.o.   MRN: [id number]  I am completing paperwork today from [facility] that requires my signature:  Received 02/03/15: Notification that she has been discharged from physical therapy after she met her goal successfully with improvements in gait transfer, endurance, energy, mobility, safety

## 2015-02-16 ENCOUNTER — Encounter: Payer: Self-pay | Admitting: Internal Medicine

## 2015-02-16 NOTE — Progress Notes (Signed)
Patient ID: [name], female   DOB: 07-29-1950, 64 y.o.   MRN: [id number]  I am completing paperwork today from [place] that requires my signature:  Received 02/16/15: Orders informing me that resident will be on a nutrition at risk program this month with weight checks 4 times a week and food intake record 3 times a week since she has lost 5 pounds in the past month and refuses to drink Ensure.  September: 90 pounds October: 90.8 pounds November: 85.6 pounds

## 2015-02-17 DIAGNOSIS — M545 Low back pain: Secondary | ICD-10-CM | POA: Diagnosis not present

## 2015-02-17 DIAGNOSIS — M25552 Pain in left hip: Secondary | ICD-10-CM | POA: Diagnosis not present

## 2015-02-19 ENCOUNTER — Emergency Department (HOSPITAL_COMMUNITY): Payer: Medicare Other

## 2015-02-19 ENCOUNTER — Encounter (HOSPITAL_COMMUNITY): Payer: Self-pay | Admitting: Emergency Medicine

## 2015-02-19 ENCOUNTER — Emergency Department (HOSPITAL_COMMUNITY)
Admission: EM | Admit: 2015-02-19 | Discharge: 2015-02-19 | Disposition: A | Discharge status: Home / Self Care | Payer: Medicare Other | Attending: Physician Assistant | Admitting: Physician Assistant

## 2015-02-19 DIAGNOSIS — J441 Chronic obstructive pulmonary disease with (acute) exacerbation: Secondary | ICD-10-CM | POA: Diagnosis not present

## 2015-02-19 DIAGNOSIS — Z9981 Dependence on supplemental oxygen: Secondary | ICD-10-CM | POA: Diagnosis not present

## 2015-02-19 DIAGNOSIS — R062 Wheezing: Secondary | ICD-10-CM | POA: Diagnosis present

## 2015-02-19 DIAGNOSIS — G894 Chronic pain syndrome: Secondary | ICD-10-CM | POA: Insufficient documentation

## 2015-02-19 DIAGNOSIS — Z8719 Personal history of other diseases of the digestive system: Secondary | ICD-10-CM | POA: Diagnosis not present

## 2015-02-19 DIAGNOSIS — F17210 Nicotine dependence, cigarettes, uncomplicated: Secondary | ICD-10-CM | POA: Diagnosis not present

## 2015-02-19 DIAGNOSIS — Z862 Personal history of diseases of the blood and blood-forming organs and certain disorders involving the immune mechanism: Secondary | ICD-10-CM | POA: Diagnosis not present

## 2015-02-19 DIAGNOSIS — Z7951 Long term (current) use of inhaled steroids: Secondary | ICD-10-CM | POA: Diagnosis not present

## 2015-02-19 DIAGNOSIS — Z79899 Other long term (current) drug therapy: Secondary | ICD-10-CM | POA: Diagnosis not present

## 2015-02-19 DIAGNOSIS — F419 Anxiety disorder, unspecified: Secondary | ICD-10-CM | POA: Insufficient documentation

## 2015-02-19 DIAGNOSIS — Z8639 Personal history of other endocrine, nutritional and metabolic disease: Secondary | ICD-10-CM | POA: Diagnosis not present

## 2015-02-19 DIAGNOSIS — Z8701 Personal history of pneumonia (recurrent): Secondary | ICD-10-CM | POA: Insufficient documentation

## 2015-02-19 DIAGNOSIS — F319 Bipolar disorder, unspecified: Secondary | ICD-10-CM | POA: Diagnosis not present

## 2015-02-19 DIAGNOSIS — M1612 Unilateral primary osteoarthritis, left hip: Secondary | ICD-10-CM | POA: Insufficient documentation

## 2015-02-19 DIAGNOSIS — R05 Cough: Secondary | ICD-10-CM | POA: Diagnosis not present

## 2015-02-19 DIAGNOSIS — R0602 Shortness of breath: Secondary | ICD-10-CM | POA: Diagnosis not present

## 2015-02-19 DIAGNOSIS — R069 Unspecified abnormalities of breathing: Secondary | ICD-10-CM | POA: Diagnosis not present

## 2015-02-19 LAB — CBC WITH DIFFERENTIAL/PLATELET
Basophils Absolute: 0 10*3/uL (ref 0.0–0.1)
Basophils Relative: 0 %
Eosinophils Absolute: 0.1 10*3/uL (ref 0.0–0.7)
Eosinophils Relative: 1 %
HCT: 41.6 % (ref 36.0–46.0)
Hemoglobin: 13.1 g/dL (ref 12.0–15.0)
Lymphocytes Relative: 17 %
Lymphs Abs: 1.3 10*3/uL (ref 0.7–4.0)
MCH: 29.2 pg (ref 26.0–34.0)
MCHC: 31.5 g/dL (ref 30.0–36.0)
MCV: 92.7 fL (ref 78.0–100.0)
Monocytes Absolute: 0.2 10*3/uL (ref 0.1–1.0)
Monocytes Relative: 3 %
Neutro Abs: 6 10*3/uL (ref 1.7–7.7)
Neutrophils Relative %: 79 %
Platelets: 391 10*3/uL (ref 150–400)
RBC: 4.49 MIL/uL (ref 3.87–5.11)
RDW: 13.6 % (ref 11.5–15.5)
WBC: 7.6 10*3/uL (ref 4.0–10.5)

## 2015-02-19 LAB — BASIC METABOLIC PANEL
Anion gap: 13 (ref 5–15)
BUN: 10 mg/dL (ref 6–20)
CO2: 32 mmol/L (ref 22–32)
Calcium: 8.8 mg/dL — ABNORMAL LOW (ref 8.9–10.3)
Chloride: 94 mmol/L — ABNORMAL LOW (ref 101–111)
Creatinine, Ser: 0.44 mg/dL (ref 0.44–1.00)
GFR calc Af Amer: >60 mL/min (ref >60)
GFR calc non Af Amer: >60 mL/min (ref >60)
Glucose, Bld: 107 mg/dL — ABNORMAL HIGH (ref 65–99)
Potassium: 3.9 mmol/L (ref 3.5–5.1)
Sodium: 139 mmol/L (ref 135–145)

## 2015-02-19 MED ORDER — PREDNISONE 50 MG PO TABS: 50.0000 mg | ORAL_TABLET | Freq: Every day | ORAL | Status: DC

## 2015-02-19 MED ORDER — GUAIFENESIN ER 1200 MG PO TB12: 1.0000 | ORAL_TABLET | Freq: Two times a day (BID) | ORAL | Status: DC

## 2015-02-19 MED ORDER — ACETAMINOPHEN-CODEINE 120-12 MG/5ML PO SOLN: 10.0000 mL | ORAL | Status: DC | PRN

## 2015-02-19 MED ORDER — ALBUTEROL (5 MG/ML) CONTINUOUS INHALATION SOLN
10.0000 mg/h | INHALATION_SOLUTION | RESPIRATORY_TRACT | Status: AC
  Administered 2015-02-19: 10 mg/h via RESPIRATORY_TRACT
  Filled 2015-02-19: qty 20

## 2015-02-19 MED ORDER — DOXYCYCLINE HYCLATE 100 MG PO CAPS: 100.0000 mg | ORAL_CAPSULE | Freq: Two times a day (BID) | ORAL | Status: DC

## 2015-02-19 MED ORDER — OXYCODONE-ACETAMINOPHEN 5-325 MG PO TABS
1.0000 | ORAL_TABLET | Freq: Once | ORAL | Status: AC
  Administered 2015-02-19: 1 via ORAL
  Filled 2015-02-19: qty 1

## 2015-02-19 MED ORDER — METHYLPREDNISOLONE SODIUM SUCC 125 MG IJ SOLR: 125.0000 mg | INTRAMUSCULAR | Status: DC

## 2015-02-19 MED ORDER — IPRATROPIUM BROMIDE 0.02 % IN SOLN
0.5000 mg | Freq: Once | RESPIRATORY_TRACT | Status: AC
  Administered 2015-02-19: 0.5 mg via RESPIRATORY_TRACT
  Filled 2015-02-19: qty 2.5

## 2015-02-19 NOTE — ED Notes (Signed)
Per EMS- (from [place] on [address]) presents with SOB since 0930 this morning. Hx COPD. At baseline is on 3 L [place] O2 at home. Took her own albuterol treatment at home. No relief. 98% SpO2 on [place] 3 L upon arrival. Still had wheezing on all fields. Has improved at this time. On duo-neb tx. 0.5 Atrovent and 5 mg Albuterol. 18 G LAC-given 125 mg Solu-medrol. Has chest tightness. Has productive cough. VS: 126/68 HR 74 RR 20. 12 lead unremarkable.

## 2015-02-19 NOTE — ED Notes (Signed)
AVS explained in detail. Knows to take medication as prescribed. No other c/c. PTAR here and given DNR and all appropriate forms.

## 2015-02-19 NOTE — Discharge Instructions (Signed)
Return here as needed.  Follow-up with your primary care doctor °

## 2015-02-19 NOTE — ED Notes (Signed)
Patient ambulated with pulse ox, heart rate ranged from 101-125, and oxygen sat. From 93-94

## 2015-02-19 NOTE — ED Notes (Signed)
Bed: [id number] Expected date:  Expected time:  Means of arrival:  Comments: Ems- 64 yo SOB

## 2015-02-19 NOTE — ED Provider Notes (Signed)
CSN: [id number]     Arrival date & time 02/19/15  1113 History   First MD Initiated Contact with Patient 02/19/15 1146     Chief Complaint  Patient presents with  . Shortness of Breath  . Wheezing  . COPD    HPI  Patient is a pleasant 64 y/o WF with a PMH of COPD here with worsening SOB, wheezing, and chest tightness that began this morning.  Patient woke up and took her morning dose of albuterol which did not relieve her symptoms.  After breakfast she still had increased SOB and therefore tried another treatment.  Still without relief, she used her rescue inhaler.  These attempts did not provide her relief, causing her to come to the ED.  She endorses a cough productive of dark brown sputum and sinus congestion x6 months.  She was diagnosed with COPD 4 yrs ago and has been on 3L home oxygen and albuterol 4-5x/day since. She endorses smoking 2 ppd cigarettes since age 63 but has reduced her smoking to 2 cigarettes/day since her COPD diagnosis.  Denies fever, palpitations, chest pain, or recent sick contacts. She shares that this is her second trip to the ED in the past 6 months for these symptoms.    Past Medical History  Diagnosis Date  . Depression   . Suicidal ideation 2007     attempted overdose 2012  Dr. Tivis Ringer report  . Bipolar disorder ([place])   . Substance abuse      narcotics, alcohol, tobacco  . Chronic pain syndrome     follows at [facility]  . DEFICIENCY, VITAMIN D NOS 01/03/2007  . Chronic lower back pain   . Anxiety   . OSTEOPOROSIS 06/17/2009    DEXA 05/2009 : L femur -2.9; R femur -2.5. Alendronate on med list but not taking. Needs addressed ASAP as h/o fractures.    . Elevated liver function tests   . On home oxygen therapy     "3L; 24/7" (8/30//2016)  . Shortness of breath     "all the time" (02/27/2013)  . COPD (chronic obstructive pulmonary disease) (HCC)     oxygen dependent (3L home continuous)  . Sciatic pain   . Anemia   . Pneumonia X 1?  .  Hyperthyroidism     "borderline"  . History of blood transfusion 03/2014; 10/2014    "low HgB" (11/11/2014)  . Duodenal ulcer, chronic   . Arthritis     "all in my body, primarily left hip" (11/11/2014)   Past Surgical History  Procedure Laterality Date  . Orif ankle fracture Left 10/2010  . Cesarean section  1974; 1979  . Tubal ligation  1979  . Augmentation mammaplasty  ~ 2007  . Fracture surgery    . Ankle debridement Left 10/2010  . Esophagogastroduodenoscopy N/A 11/07/2014    Procedure: ESOPHAGOGASTRODUODENOSCOPY (EGD);  Surgeon: Irene Shipper, MD;  Location: [facility] ENDOSCOPY;  Service: Endoscopy;  Laterality: N/A;   Family History  Problem Relation Age of Onset  . Stroke Neg Hx   . Cancer Neg Hx   . Myelodysplastic syndrome Father     Died from myelofibrosis though diagnosed post-mortem   Social History  Substance Use Topics  . Smoking status: Current Every Day Smoker -- 0.20 packs/day for 45 years    Types: Cigarettes  . Smokeless tobacco: Never Used     Comment: 11/11/2014 ""smoking 1 cigarettes/day; that's down from 2 ppd"  . Alcohol Use: 0.0 oz/week    0  Standard drinks or equivalent per week     Comment: 11/11/2014 "nothing in 7 years; never had a problem w/it"   OB History    No data available     Review of Systems All other systems negative except as documented in the HPI. All pertinent positives and negatives as reviewed in the HPI.    Allergies  Banana; Lyrica; and Pollen extract  Home Medications   Prior to Admission medications   Medication Sig Start Date End Date Taking? Authorizing Provider  acetaminophen (TYLENOL) 500 MG tablet Take 1-2 tablets (500-1,000 mg total) by mouth every 12 (twelve) hours as needed (Breakthrough pain). 12/22/14 12/22/15  Rushil Sherrye Payor, MD  alendronate (FOSAMAX) 70 MG tablet Take 1 tablet (70 mg total) by mouth once a week. Take with a full glass of water on an empty stomach. Patient not taking: Reported on 12/05/2014 10/16/14   Riccardo Dubin, MD  ENSURE (ENSURE) Take 237 mLs by mouth 3 (three) times daily between meals.    Historical Provider, MD  fluticasone (FLONASE) 50 MCG/ACT nasal spray Place 1 spray into both nostrils daily. 06/13/14 06/13/15  Francesca Oman, DO  Fluticasone-Salmeterol (ADVAIR DISKUS) 250-50 MCG/DOSE AEPB INHALE 1 PUFF INTO THE LUNGS 2 TIMES DAILY 03/19/14   Jones Bales, MD  gabapentin (NEURONTIN) 300 MG capsule Take 300 mg by mouth 3 (three) times daily.  06/25/14   Historical Provider, MD  guaiFENesin-dextromethorphan (ROBITUSSIN DM) 100-10 MG/5ML syrup Take 5 mLs by mouth every 4 (four) hours as needed for cough. 02/04/15   Rushil Sherrye Payor, MD  ipratropium-albuterol (DUONEB) 0.5-2.5 (3) MG/3ML SOLN 4 times a day (breakfast, lunch, dinner, bedtime).Can take 2 additional treatments if needed on bad days. DX J43.8 07/03/14   Kathee Delton, MD  lamoTRIgine (LAMICTAL) 100 MG tablet TAKE ONE TABLET EVERY DAY Patient not taking: Reported on 12/05/2014 09/23/14   Sid Falcon, MD  loratadine (CLARITIN) 10 MG tablet Take 10 mg by mouth daily as needed for allergies (seasonal).     Historical Provider, MD  Melatonin 10 MG TABS Take 1 tablet by mouth at bedtime as needed. 01/22/15   Alexa Sherral Hammers, MD  ondansetron (ZOFRAN-ODT) 4 MG disintegrating tablet Take 1 tablet (4 mg total) by mouth every 8 (eight) hours as needed for nausea or vomiting. Patient not taking: Reported on 12/05/2014 11/21/14   Ejiroghene Arlyce Dice, MD  Oxycodone HCl 10 MG TABS Take 10 mg by mouth 2 (two) times daily.  10/16/14   Historical Provider, MD  pantoprazole (PROTONIX) 40 MG tablet Take 1 tablet (40 mg total) by mouth 2 (two) times daily. Patient not taking: Reported on 12/05/2014 11/09/14   Burgess Estelle, MD  polyethylene glycol powder (GLYCOLAX/MIRALAX) powder TAKE 17G DISSOLVED IN 8OZ WATER DAILY ASNEEDED FOR MODERATE CONSTIPATION Patient not taking: Reported on 12/05/2014 09/23/14   Sid Falcon, MD  PROAIR HFA 108 (90 BASE) MCG/ACT  inhaler INHALE ONE PUFF BY MOUTH EVERY 2 TO 4 HOURS AS NEEDED FOR WHEEZING 05/21/14   Rushil Sherrye Payor, MD  promethazine (PHENERGAN) 12.5 MG tablet Take 1 tablet (12.5 mg total) by mouth every 6 (six) hours as needed for nausea or vomiting. Patient not taking: Reported on 12/05/2014 11/24/14   Ejiroghene Arlyce Dice, MD  senna (SENOKOT) 8.6 MG TABS tablet Take 2 tablets (17.2 mg total) by mouth 3 (three) times daily as needed for mild constipation. Patient not taking: Reported on 12/05/2014 11/13/14   Jule Ser, DO  sucralfate ([place])  1 GM/10ML suspension Take 10 mLs (1 g total) by mouth 4 (four) times daily -  with meals and at bedtime. 12/05/14   Rushil Sherrye Payor, MD   BP 113/74 mmHg  Pulse 80  Temp(Src) 97.8 F (36.6 C) (Oral)  Resp 17  Ht 5\' 1"  (1.549 m)  Wt 38.556 kg  BMI 16.07 kg/m2  SpO2 100%   Physical Exam  Constitutional: She is oriented to person, place, and time. She appears well-developed and well-nourished. She has a sickly appearance. No distress. Nasal cannula in place.  HENT:  Nose: Rhinorrhea present. Right sinus exhibits no maxillary sinus tenderness and no frontal sinus tenderness. Left sinus exhibits no maxillary sinus tenderness and no frontal sinus tenderness.  Mouth/Throat: Oropharynx is clear and moist. No oropharyngeal exudate.  Eyes: Pupils are equal, round, and reactive to light.  Neck: Normal range of motion. Neck supple.  Cardiovascular: Normal rate, regular rhythm and normal heart sounds.   No murmur heard. Pulmonary/Chest: Effort normal. No respiratory distress. She has wheezes in the right lower field and the left lower field. She has no rhonchi. She has no rales.  Musculoskeletal: She exhibits no edema.  Neurological: She is alert and oriented to person, place, and time.  Skin: Skin is warm and dry.  Nursing note and vitals reviewed.   ED Course  Procedures (including critical care time) Labs Review Labs Reviewed  BASIC METABOLIC PANEL - Abnormal;  Notable for the following:    Chloride 94 (*)    Glucose, Bld 107 (*)    Calcium 8.8 (*)    All other components within normal limits  CBC WITH DIFFERENTIAL/PLATELET    Imaging Review Dg Chest 2 View  02/19/2015  CLINICAL DATA:  Productive cough, shortness of Breath EXAM: CHEST  2 VIEW COMPARISON:  10/26/2014 FINDINGS: Cardiomediastinal silhouette is stable. No acute infiltrate or pleural effusion. No pulmonary edema. Extensive bullous emphysematous changes bilaterally again noted. Hyperinflation. Bony thorax is unremarkable. IMPRESSION: Stable hyperinflation and chronic emphysematous changes. No definite superimposed infiltrate or pulmonary edema. Electronically Signed   By: Lahoma Crocker M.D.   On: 02/19/2015 13:07   I have personally reviewed and evaluated these images and lab results as part of my medical decision-making.   EKG Interpretation None      Assessment & Plan  Likely COPD exacerbation  - CXR to r/o superimposed infection - Albuterol and ipratropium for relief of symptoms  Patient is feeling better at this time.  The patient is maintaining oxygen saturations on her home oxygen.  Patient will be given treatment for acute exacerbation of her COPD at this time.  She does appear stable and could go home with follow-up with her primary care doctor.  She ambulated without significant difficulty.  Her main complaint is that her chronic pain at this point  Dalia Heading, PA-C 02/19/15 [place], MD 02/19/15 1554

## 2015-03-13 ENCOUNTER — Encounter: Payer: Self-pay | Admitting: Internal Medicine

## 2015-03-13 NOTE — Progress Notes (Signed)
Patient ID: [name], female   DOB: 06/03/1950, 64 y.o.   MRN: [id number]  I am completing paperwork today from [place] that requires my signature:  Received 03/10/15: Orders to continue medication she received from the emergency department on 02/19/15 for acute COPD exacerbation  -Prednisone 50 mg daily 5 tablets and doxycycline 100 mg twice daily 10 tablets which will not be refilled since these medications for an acute treatment.  -Acetaminophen-codeine to 40-12 mg/10 mL solution every 4 hours as needed for cough which will not be refilled given the numerous occasions where she has been found to be encephalopathic in the setting of her sedative medications.  -Guaifenesin 1200 mg twice daily for cough which will not be continued at she is already receiving guaifenesin dextromethorphan 100-10 mg/5 mL every 4 hours as needed for cough.

## 2015-03-14 DIAGNOSIS — J449 Chronic obstructive pulmonary disease, unspecified: Secondary | ICD-10-CM | POA: Diagnosis not present

## 2015-03-17 NOTE — Progress Notes (Signed)
Left msg for patient to call me back.

## 2015-03-18 NOTE — Progress Notes (Signed)
2nd call to patient to see how cough is, phone picked up and disconnected.

## 2015-03-19 DIAGNOSIS — G894 Chronic pain syndrome: Secondary | ICD-10-CM | POA: Diagnosis not present

## 2015-03-19 DIAGNOSIS — M545 Low back pain: Secondary | ICD-10-CM | POA: Diagnosis not present

## 2015-03-19 DIAGNOSIS — G8929 Other chronic pain: Secondary | ICD-10-CM | POA: Diagnosis not present

## 2015-03-20 NOTE — Progress Notes (Signed)
Called pt, it was answered though no voice and disconnected

## 2015-04-14 DIAGNOSIS — J449 Chronic obstructive pulmonary disease, unspecified: Secondary | ICD-10-CM | POA: Diagnosis not present

## 2015-04-16 DIAGNOSIS — G8929 Other chronic pain: Secondary | ICD-10-CM | POA: Diagnosis not present

## 2015-04-16 DIAGNOSIS — M545 Low back pain: Secondary | ICD-10-CM | POA: Diagnosis not present

## 2015-04-16 DIAGNOSIS — G894 Chronic pain syndrome: Secondary | ICD-10-CM | POA: Diagnosis not present

## 2015-04-16 DIAGNOSIS — Z79891 Long term (current) use of opiate analgesic: Secondary | ICD-10-CM | POA: Diagnosis not present

## 2015-04-16 DIAGNOSIS — M25552 Pain in left hip: Secondary | ICD-10-CM | POA: Diagnosis not present

## 2015-05-05 ENCOUNTER — Ambulatory Visit (INDEPENDENT_AMBULATORY_CARE_PROVIDER_SITE_OTHER): Payer: Medicare Other | Admitting: Internal Medicine

## 2015-05-05 ENCOUNTER — Encounter: Payer: Self-pay | Admitting: Internal Medicine

## 2015-05-05 VITALS — BP 125/63 | HR 80 | Temp 98.0°F | Ht 61.0 in | Wt 84.8 lb

## 2015-05-05 DIAGNOSIS — Z9981 Dependence on supplemental oxygen: Secondary | ICD-10-CM | POA: Diagnosis not present

## 2015-05-05 DIAGNOSIS — G8929 Other chronic pain: Secondary | ICD-10-CM | POA: Diagnosis not present

## 2015-05-05 DIAGNOSIS — J302 Other seasonal allergic rhinitis: Secondary | ICD-10-CM

## 2015-05-05 MED ORDER — DESLORATADINE 5 MG PO TABS: 5.0000 mg | ORAL_TABLET | Freq: Every day | ORAL | Status: DC

## 2015-05-05 NOTE — Assessment & Plan Note (Signed)
A: Chronic pain  P: I discussed that the pain clinics are very busy seeing patients but that I understand her frustration, and from review of her notes that she had a similar complaint in November. I will put in a referral to see if she can be seen at another pain clinic.

## 2015-05-05 NOTE — Assessment & Plan Note (Signed)
A: Seasonal Allergies  P: Will change loratadine to Clarinex 5mg  daily

## 2015-05-05 NOTE — Progress Notes (Signed)
[facility] Subjective:   Patient ID: [name] female   DOB: 03-10-1951 65 y.o.   MRN: [id number]  HPI: [name] is a 65 y.o. female with a PMH detailed below who presents for evaluation of allergic rhinnitis.  She reports Claritin is no longer working for her allergies.  She feels more congested with post nasal drip.  She has also tried zyrtec in the past without any relief as well as allegra.  She would like something similar to claritin.  She does not use any nasal corticosteroids as she hates the sensation of something going into her nose.  She reports associated symptoms of sinus pressure.  For her chronic pain she follows at [facility].  She would like a referral to a different pain clinic because she recently had to wait 6 hours to be seen.  She is now living at [place], she reports that the nurse techs administer her nebulizer breathing treatment and it takes them too long to respond, she would like to be able to administer her own treatments.    Past Medical History  Diagnosis Date  . Depression   . Suicidal ideation 2007     attempted overdose 2012  Dr. Tivis Ringer report  . Bipolar disorder ([place])   . Substance abuse      narcotics, alcohol, tobacco  . Chronic pain syndrome     follows at [facility]  . DEFICIENCY, VITAMIN D NOS 01/03/2007  . Chronic lower back pain   . Anxiety   . OSTEOPOROSIS 06/17/2009    DEXA 05/2009 : L femur -2.9; R femur -2.5. Alendronate on med list but not taking. Needs addressed ASAP as h/o fractures.    . Elevated liver function tests   . On home oxygen therapy     "3L; 24/7" (8/30//2016)  . Shortness of breath     "all the time" (02/27/2013)  . COPD (chronic obstructive pulmonary disease) (HCC)     oxygen dependent (3L home continuous)  . Sciatic pain   . Anemia   . Pneumonia X 1?  . Hyperthyroidism     "borderline"  . History of blood transfusion 03/2014; 10/2014     "low HgB" (11/11/2014)  . Duodenal ulcer, chronic   . Arthritis     "all in my body, primarily left hip" (11/11/2014)   Current Outpatient Prescriptions  Medication Sig Dispense Refill  . acetaminophen (TYLENOL) 500 MG tablet Take 1-2 tablets (500-1,000 mg total) by mouth every 12 (twelve) hours as needed (Breakthrough pain). (Patient taking differently: Take 500 mg by mouth every 12 (twelve) hours as needed (Breakthrough pain). ) 60 tablet 0  . alendronate (FOSAMAX) 70 MG tablet Take 1 tablet (70 mg total) by mouth once a week. Take with a full glass of water on an empty stomach. 4 tablet 0  . desloratadine (CLARINEX) 5 MG tablet Take 1 tablet (5 mg total) by mouth daily. 30 tablet 2  . DULoxetine (CYMBALTA) 30 MG capsule Take 30 mg by mouth daily.    Marland Kitchen ENSURE (ENSURE) Take 237 mLs by mouth 3 (three) times daily between meals.    . fluticasone (FLONASE) 50 MCG/ACT nasal spray Place 1 spray into both nostrils daily. (Patient not taking: Reported on 02/19/2015) 16 g 1  . Fluticasone-Salmeterol (ADVAIR DISKUS) 250-50 MCG/DOSE AEPB INHALE 1 PUFF INTO THE LUNGS 2 TIMES DAILY 5 each 6  . gabapentin (NEURONTIN) 300 MG capsule Take 300 mg by mouth 3 (  three) times daily.     Marland Kitchen guaiFENesin-dextromethorphan (ROBITUSSIN DM) 100-10 MG/5ML syrup Take 5 mLs by mouth every 4 (four) hours as needed for cough. 118 mL 0  . ipratropium-albuterol (DUONEB) 0.5-2.5 (3) MG/3ML SOLN 4 times a day (breakfast, lunch, dinner, bedtime).Can take 2 additional treatments if needed on bad days. DX J43.8 (Patient taking differently: Take 3 mLs by mouth 4 (four) times daily. ) 360 mL 3  . ipratropium-albuterol (DUONEB) 0.5-2.5 (3) MG/3ML SOLN Take 3 mLs by nebulization every 12 (twelve) hours as needed (for wheezing and shortness of breath).    . lamoTRIgine (LAMICTAL) 100 MG tablet TAKE ONE TABLET EVERY DAY 30 tablet 2  . Melatonin 10 MG TABS Take 1 tablet by mouth at bedtime as needed. (Patient taking differently: Take 1 tablet by  mouth at bedtime. ) 30 tablet 3  . ondansetron (ZOFRAN-ODT) 4 MG disintegrating tablet Take 1 tablet (4 mg total) by mouth every 8 (eight) hours as needed for nausea or vomiting. (Patient not taking: Reported on 12/05/2014) 30 tablet 0  . Oxycodone HCl 10 MG TABS Take 10 mg by mouth 2 (two) times daily.     . OXYGEN Inhale 3 L into the lungs continuous.    . pantoprazole (PROTONIX) 40 MG tablet Take 1 tablet (40 mg total) by mouth 2 (two) times daily. 60 tablet 2  . polyethylene glycol powder (GLYCOLAX/MIRALAX) powder TAKE 17G DISSOLVED IN 8OZ WATER DAILY ASNEEDED FOR MODERATE CONSTIPATION 527 g 2  . PROAIR HFA 108 (90 BASE) MCG/ACT inhaler INHALE ONE PUFF BY MOUTH EVERY 2 TO 4 HOURS AS NEEDED FOR WHEEZING 8.5 g 12  . promethazine (PHENERGAN) 12.5 MG tablet Take 1 tablet (12.5 mg total) by mouth every 6 (six) hours as needed for nausea or vomiting. (Patient not taking: Reported on 12/05/2014) 30 tablet 0  . senna (SENOKOT) 8.6 MG TABS tablet Take 2 tablets (17.2 mg total) by mouth 3 (three) times daily as needed for mild constipation. 120 each 0  . sucralfate (CARAFATE) 1 GM/10ML suspension Take 10 mLs (1 g total) by mouth 4 (four) times daily -  with meals and at bedtime. (Patient not taking: Reported on 02/19/2015) 420 mL 0  . zolpidem (AMBIEN) 5 MG tablet Take 5 mg by mouth at bedtime as needed for sleep.     No current facility-administered medications for this visit.   Family History  Problem Relation Age of Onset  . Stroke Neg Hx   . Cancer Neg Hx   . Myelodysplastic syndrome Father     Died from myelofibrosis though diagnosed post-mortem   Social History   Social History  . Marital Status: Divorced    Spouse Name: N/A  . Number of Children: 2  . Years of Education: N/A   Occupational History  . retired    Social History Main Topics  . Smoking status: Current Every Day Smoker -- 0.20 packs/day for 45 years    Types: Cigarettes  . Smokeless tobacco: Never Used     Comment:  11/11/2014 ""smoking 1 cigarettes/day; that's down from 2 ppd"  . Alcohol Use: 0.0 oz/week    0 Standard drinks or equivalent per week     Comment: 11/11/2014 "nothing in 7 years; never had a problem w/it"  . Drug Use: No  . Sexual Activity: No   Other Topics Concern  . 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  HENT: Positive for congestion. Negative for ear pain and nosebleeds.   Eyes: Negative for blurred vision.  Respiratory: Positive for shortness of breath (chronic) and wheezing ("when I dont get nebulizer treatments"). Negative for cough.   Cardiovascular: Negative for chest pain.  Gastrointestinal: Negative for heartburn.  Neurological: Negative for headaches.     Objective:  Physical Exam: Filed Vitals:   05/05/15 0938  BP: 125/63  Pulse: 80  Temp: 98 F (36.7 C)  TempSrc: Oral  Height: 5\' 1"  (1.549 m)  Weight: 84 lb 12.8 oz (38.465 kg)  SpO2: 99%  Physical Exam  Constitutional:  Frail, in wheelchair, on supplemental O2  Cardiovascular: Normal rate and regular rhythm.   Pulmonary/Chest: Effort normal and breath sounds normal. No respiratory distress. She has no wheezes.  Abdominal: Soft. Bowel sounds are normal.  Nursing note and vitals reviewed.   Assessment & Plan:  Case discussed with Dr. Daryll Drown  Seasonal allergies A: Seasonal Allergies  P: Will change loratadine to Clarinex 5mg  daily  Chronic pain A: Chronic pain  P: I discussed that the pain clinics are very busy seeing patients but that I understand her frustration, and from review of her notes that she had a similar complaint in November. I will put in a referral to see if she can be seen at another pain  clinic.    Medications Ordered Meds ordered this encounter  Medications  . desloratadine (CLARINEX) 5 MG tablet    Sig: Take 1 tablet (5 mg total) by mouth daily.    Dispense:  30 tablet    Refill:  2   Other Orders Orders Placed This Encounter  Procedures  . Ambulatory referral to Pain Clinic    Referral Priority:  Routine    Referral Type:  Consultation    Referral Reason:  Specialty Services Required    Requested Specialty:  Pain Medicine    Number of Visits Requested:  1   Follow Up: Return in about 3 months (around 08/02/2015).

## 2015-05-05 NOTE — Patient Instructions (Signed)
General Instructions:   Please bring your medicines with you each time you come to clinic.  Medicines may include prescription medications, over-the-counter medications, herbal remedies, eye drops, vitamins, or other pills.   Progress Toward Treatment Goals:  No flowsheet data found.  Self Care Goals & Plans:  Self Care Goal 01/22/2015  Manage my medications take my medicines as prescribed; bring my medications to every visit; refill my medications on time; follow the sick day instructions if I am sick  Monitor my health keep track of my blood pressure; keep track of my weight  Eat healthy foods eat more vegetables; eat fruit for snacks and desserts; eat baked foods instead of fried foods; eat foods that are low in salt; eat smaller portions  Be physically active find an activity I enjoy  Stop smoking -    No flowsheet data found.   Care Management & Community Referrals:  No flowsheet data found.

## 2015-05-06 ENCOUNTER — Emergency Department (HOSPITAL_COMMUNITY): Payer: Medicare Other

## 2015-05-06 ENCOUNTER — Observation Stay (HOSPITAL_COMMUNITY): Payer: Medicare Other

## 2015-05-06 ENCOUNTER — Observation Stay (HOSPITAL_COMMUNITY)
Admission: EM | Admit: 2015-05-06 | Discharge: 2015-05-08 | Disposition: A | Discharge status: Home / Self Care | Payer: Medicare Other | Attending: Internal Medicine | Admitting: Internal Medicine

## 2015-05-06 ENCOUNTER — Encounter (HOSPITAL_COMMUNITY): Payer: Self-pay | Admitting: *Deleted

## 2015-05-06 DIAGNOSIS — G8929 Other chronic pain: Secondary | ICD-10-CM | POA: Insufficient documentation

## 2015-05-06 DIAGNOSIS — Z7983 Long term (current) use of bisphosphonates: Secondary | ICD-10-CM | POA: Insufficient documentation

## 2015-05-06 DIAGNOSIS — M810 Age-related osteoporosis without current pathological fracture: Secondary | ICD-10-CM | POA: Insufficient documentation

## 2015-05-06 DIAGNOSIS — R069 Unspecified abnormalities of breathing: Secondary | ICD-10-CM | POA: Diagnosis not present

## 2015-05-06 DIAGNOSIS — Z7951 Long term (current) use of inhaled steroids: Secondary | ICD-10-CM | POA: Diagnosis not present

## 2015-05-06 DIAGNOSIS — J441 Chronic obstructive pulmonary disease with (acute) exacerbation: Secondary | ICD-10-CM | POA: Diagnosis not present

## 2015-05-06 DIAGNOSIS — F17210 Nicotine dependence, cigarettes, uncomplicated: Secondary | ICD-10-CM | POA: Diagnosis not present

## 2015-05-06 DIAGNOSIS — Z79891 Long term (current) use of opiate analgesic: Secondary | ICD-10-CM | POA: Diagnosis not present

## 2015-05-06 DIAGNOSIS — F319 Bipolar disorder, unspecified: Secondary | ICD-10-CM | POA: Insufficient documentation

## 2015-05-06 DIAGNOSIS — F3130 Bipolar disorder, current episode depressed, mild or moderate severity, unspecified: Secondary | ICD-10-CM | POA: Diagnosis present

## 2015-05-06 DIAGNOSIS — K5900 Constipation, unspecified: Secondary | ICD-10-CM | POA: Insufficient documentation

## 2015-05-06 DIAGNOSIS — Z79899 Other long term (current) drug therapy: Secondary | ICD-10-CM | POA: Insufficient documentation

## 2015-05-06 DIAGNOSIS — R0602 Shortness of breath: Secondary | ICD-10-CM | POA: Diagnosis present

## 2015-05-06 DIAGNOSIS — Z9981 Dependence on supplemental oxygen: Secondary | ICD-10-CM | POA: Insufficient documentation

## 2015-05-06 DIAGNOSIS — J439 Emphysema, unspecified: Secondary | ICD-10-CM | POA: Diagnosis not present

## 2015-05-06 DIAGNOSIS — F17200 Nicotine dependence, unspecified, uncomplicated: Secondary | ICD-10-CM | POA: Diagnosis present

## 2015-05-06 DIAGNOSIS — J189 Pneumonia, unspecified organism: Secondary | ICD-10-CM | POA: Insufficient documentation

## 2015-05-06 DIAGNOSIS — D509 Iron deficiency anemia, unspecified: Secondary | ICD-10-CM | POA: Diagnosis not present

## 2015-05-06 DIAGNOSIS — M1990 Unspecified osteoarthritis, unspecified site: Secondary | ICD-10-CM | POA: Diagnosis not present

## 2015-05-06 DIAGNOSIS — G4700 Insomnia, unspecified: Secondary | ICD-10-CM | POA: Insufficient documentation

## 2015-05-06 LAB — CBC WITH DIFFERENTIAL/PLATELET
BAND NEUTROPHILS: 0 %
BASOS ABS: 0 10*3/uL (ref 0.0–0.1)
Basophils Relative: 0 %
Blasts: 0 %
EOS ABS: 0 10*3/uL (ref 0.0–0.7)
EOS PCT: 0 %
HCT: 34.7 % — ABNORMAL LOW (ref 36.0–46.0)
Hemoglobin: 11.6 g/dL — ABNORMAL LOW (ref 12.0–15.0)
LYMPHS ABS: 1.2 10*3/uL (ref 0.7–4.0)
Lymphocytes Relative: 14 %
MCH: 29.7 pg (ref 26.0–34.0)
MCHC: 33.4 g/dL (ref 30.0–36.0)
MCV: 89 fL (ref 78.0–100.0)
METAMYELOCYTES PCT: 0 %
MONOS PCT: 6 %
Monocytes Absolute: 0.5 10*3/uL (ref 0.1–1.0)
Myelocytes: 0 %
NEUTROS ABS: 6.8 10*3/uL (ref 1.7–7.7)
Neutrophils Relative %: 80 %
Other: 0 %
Platelets: 367 10*3/uL (ref 150–400)
Promyelocytes Absolute: 0 %
RBC: 3.9 MIL/uL (ref 3.87–5.11)
RDW: 13.3 % (ref 11.5–15.5)
WBC: 8.5 10*3/uL (ref 4.0–10.5)
nRBC: 0 /100 WBC

## 2015-05-06 LAB — BASIC METABOLIC PANEL
Anion gap: 12 (ref 5–15)
BUN: 6 mg/dL (ref 6–20)
CHLORIDE: 92 mmol/L — AB (ref 101–111)
CO2: 28 mmol/L (ref 22–32)
Calcium: 8 mg/dL — ABNORMAL LOW (ref 8.9–10.3)
Creatinine, Ser: <0.3 mg/dL — ABNORMAL LOW (ref 0.44–1.00)
GLUCOSE: 98 mg/dL (ref 65–99)
POTASSIUM: 3.6 mmol/L (ref 3.5–5.1)
Sodium: 132 mmol/L — ABNORMAL LOW (ref 135–145)

## 2015-05-06 MED ORDER — SENNA 8.6 MG PO TABS: 2.0000 | ORAL_TABLET | Freq: Three times a day (TID) | ORAL | Status: DC | PRN

## 2015-05-06 MED ORDER — IPRATROPIUM BROMIDE 0.02 % IN SOLN
0.5000 mg | Freq: Once | RESPIRATORY_TRACT | Status: AC
  Administered 2015-05-06: 0.5 mg via RESPIRATORY_TRACT
  Filled 2015-05-06: qty 2.5

## 2015-05-06 MED ORDER — LEVOFLOXACIN IN D5W 500 MG/100ML IV SOLN
500.0000 mg | INTRAVENOUS | Status: DC
  Administered 2015-05-06: 500 mg via INTRAVENOUS
  Filled 2015-05-06: qty 100

## 2015-05-06 MED ORDER — GUAIFENESIN-DM 100-10 MG/5ML PO SYRP: 5.0000 mL | ORAL_SOLUTION | ORAL | Status: DC | PRN

## 2015-05-06 MED ORDER — IPRATROPIUM-ALBUTEROL 0.5-2.5 (3) MG/3ML IN SOLN
3.0000 mL | Freq: Four times a day (QID) | RESPIRATORY_TRACT | Status: DC
  Administered 2015-05-07 (×2): 3 mL via RESPIRATORY_TRACT
  Filled 2015-05-06 (×3): qty 3

## 2015-05-06 MED ORDER — ACETAMINOPHEN 500 MG PO TABS: 500.0000 mg | ORAL_TABLET | Freq: Two times a day (BID) | ORAL | Status: DC | PRN

## 2015-05-06 MED ORDER — DM-GUAIFENESIN ER 30-600 MG PO TB12
1.0000 | ORAL_TABLET | Freq: Two times a day (BID) | ORAL | Status: DC
  Administered 2015-05-06 – 2015-05-08 (×4): 1 via ORAL
  Filled 2015-05-06 (×7): qty 1

## 2015-05-06 MED ORDER — ALBUTEROL SULFATE (2.5 MG/3ML) 0.083% IN NEBU
5.0000 mg | INHALATION_SOLUTION | RESPIRATORY_TRACT | Status: AC | PRN
  Administered 2015-05-06: 5 mg via RESPIRATORY_TRACT
  Filled 2015-05-06: qty 6

## 2015-05-06 MED ORDER — SODIUM CHLORIDE 0.9% FLUSH
3.0000 mL | Freq: Two times a day (BID) | INTRAVENOUS | Status: DC
  Administered 2015-05-06 – 2015-05-08 (×3): 3 mL via INTRAVENOUS

## 2015-05-06 MED ORDER — AZITHROMYCIN 500 MG IV SOLR
500.0000 mg | Freq: Once | INTRAVENOUS | Status: AC
  Administered 2015-05-06: 500 mg via INTRAVENOUS
  Filled 2015-05-06: qty 500

## 2015-05-06 MED ORDER — OXYCODONE HCL 5 MG PO TABS
10.0000 mg | ORAL_TABLET | Freq: Once | ORAL | Status: AC
  Administered 2015-05-06: 10 mg via ORAL
  Filled 2015-05-06: qty 2

## 2015-05-06 MED ORDER — ENSURE ENLIVE PO LIQD
237.0000 mL | Freq: Three times a day (TID) | ORAL | Status: DC
  Administered 2015-05-06: 237 mL via ORAL

## 2015-05-06 MED ORDER — PANTOPRAZOLE SODIUM 40 MG PO TBEC
40.0000 mg | DELAYED_RELEASE_TABLET | Freq: Two times a day (BID) | ORAL | Status: DC
  Administered 2015-05-06 – 2015-05-08 (×4): 40 mg via ORAL
  Filled 2015-05-06 (×4): qty 1

## 2015-05-06 MED ORDER — ZOLPIDEM TARTRATE 5 MG PO TABS
5.0000 mg | ORAL_TABLET | Freq: Every evening | ORAL | Status: DC | PRN
  Administered 2015-05-06 – 2015-05-07 (×2): 5 mg via ORAL
  Filled 2015-05-06 (×2): qty 1

## 2015-05-06 MED ORDER — PREDNISONE 20 MG PO TABS
40.0000 mg | ORAL_TABLET | Freq: Every day | ORAL | Status: DC
  Administered 2015-05-07 – 2015-05-08 (×2): 40 mg via ORAL
  Filled 2015-05-06 (×2): qty 2

## 2015-05-06 MED ORDER — SODIUM CHLORIDE 0.9% FLUSH: 3.0000 mL | INTRAVENOUS | Status: DC | PRN

## 2015-05-06 MED ORDER — MELATONIN 3 MG PO TABS
3.0000 mg | ORAL_TABLET | Freq: Every evening | ORAL | Status: DC | PRN
  Filled 2015-05-06: qty 1

## 2015-05-06 MED ORDER — LAMOTRIGINE 100 MG PO TABS
100.0000 mg | ORAL_TABLET | Freq: Every day | ORAL | Status: DC
  Administered 2015-05-06 – 2015-05-08 (×3): 100 mg via ORAL
  Filled 2015-05-06 (×4): qty 1

## 2015-05-06 MED ORDER — GABAPENTIN 300 MG PO CAPS
300.0000 mg | ORAL_CAPSULE | Freq: Three times a day (TID) | ORAL | Status: DC
  Administered 2015-05-06 – 2015-05-08 (×7): 300 mg via ORAL
  Filled 2015-05-06 (×7): qty 1

## 2015-05-06 MED ORDER — POLYETHYLENE GLYCOL 3350 17 G PO PACK: 17.0000 g | PACK | Freq: Every day | ORAL | Status: DC | PRN

## 2015-05-06 MED ORDER — ALBUTEROL (5 MG/ML) CONTINUOUS INHALATION SOLN
15.0000 mg/h | INHALATION_SOLUTION | Freq: Once | RESPIRATORY_TRACT | Status: AC
  Administered 2015-05-06: 15 mg/h via RESPIRATORY_TRACT
  Filled 2015-05-06: qty 20

## 2015-05-06 MED ORDER — DEXTROSE 5 % IV SOLN
1.0000 g | Freq: Once | INTRAVENOUS | Status: AC
  Administered 2015-05-06: 1 g via INTRAVENOUS
  Filled 2015-05-06: qty 10

## 2015-05-06 MED ORDER — DULOXETINE HCL 20 MG PO CPEP
40.0000 mg | ORAL_CAPSULE | Freq: Every day | ORAL | Status: DC
  Administered 2015-05-06 – 2015-05-07 (×2): 40 mg via ORAL
  Filled 2015-05-06 (×3): qty 2

## 2015-05-06 MED ORDER — ENOXAPARIN SODIUM 30 MG/0.3ML ~~LOC~~ SOLN
20.0000 mg | SUBCUTANEOUS | Status: DC
  Administered 2015-05-06 – 2015-05-07 (×2): 20 mg via SUBCUTANEOUS
  Filled 2015-05-06 (×3): qty 0.2
  Filled 2015-05-06: qty 0.3

## 2015-05-06 MED ORDER — OXYCODONE HCL 5 MG PO TABS
10.0000 mg | ORAL_TABLET | Freq: Three times a day (TID) | ORAL | Status: DC
  Administered 2015-05-06 – 2015-05-07 (×3): 10 mg via ORAL
  Filled 2015-05-06 (×3): qty 2

## 2015-05-06 MED ORDER — LORATADINE 10 MG PO TABS
10.0000 mg | ORAL_TABLET | Freq: Every day | ORAL | Status: DC
  Administered 2015-05-06 – 2015-05-08 (×3): 10 mg via ORAL
  Filled 2015-05-06 (×3): qty 1

## 2015-05-06 NOTE — ED Notes (Addendum)
Pt from [facility] and awoke with dyspnea. Received inhaler treatments at facility however was still wheezing so EMS was called. Pt given solumedrol and nebulizer treatments in route with EMS

## 2015-05-06 NOTE — Clinical Social Work Note (Signed)
Clinical Social Work Assessment  Patient Details  Name: [name] MRN: [id number] Date of Birth: 22-Jun-1950  Date of referral:  05/06/15               Reason for consult:  Other (Comment Required) (Admitted from facility)                Permission sought to share information with:    Permission granted to share information::     Name::        Agency::     Relationship::     Contact Information:     Housing/Transportation Living arrangements for the past 2 months:  Why (Patient from [name]) Source of Information:  Patient Patient Interpreter Needed:  None Criminal Activity/Legal Involvement Pertinent to Current Situation/Hospitalization:  No - Comment as needed Significant Relationships:  Siblings (Patient reports her sister [name] is her POA ) Lives with:  Facility Resident Do you feel safe going back to the place where you live?    Need for family participation in patient care:   (Unknown)  Care giving concerns: Unknown at this time   Facilities manager / plan: CSW spoke with patient at bedside with no family present. Patient reports she is from [name] and she has been there since last October. Patient reports she could not breath this morning. Patient reports she had a breathing treatment that did not work. Patient reports they found out that she has pneumonia and she will be admitted inpatient.   Patient reports she has sister in [name], [name], [name] that is her medical POA. Patient reports they talk via phone everyday. Patient reports she receives assistance with bathing and dressing, however, she can feed herself. Patient reports she uses a walker, which she states she relies on heavily. Patient reports she is retired and she states she is on disability for some health issues. No questions noted for CSW at this time.    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) PT Recommendations:  Not assessed at this time Information / Referral to community resources:  Other (Comment Required) (None given at this time)  Patient/Family's Response to care: Unknown  Patient/Family's Understanding of and Emotional Response to Diagnosis, Current Treatment, and Prognosis: Unknown  Emotional Assessment Appearance:  Appears stated age Attitude/Demeanor/Rapport:    Affect (typically observed):  Appropriate Orientation:  Oriented to Self, Oriented to Place, Oriented to  Time, Oriented to Situation Alcohol / Substance use:  Not Applicable Psych involvement (Current and /or in the community):  No (Comment)  Discharge Needs  Concerns to be addressed:  Other (Comment Required (Unknown at this time) Readmission within the last 30 days:    Current discharge risk:    Barriers to Discharge:  Other (Unknown at this time)   Jordan Sabin, LCSW 05/06/2015, 1:08 PM

## 2015-05-06 NOTE — H&P (Signed)
Date: 05/06/2015               Patient Name:  [name] MRN: [id number]  DOB: [birth year] Age / Sex: 65 y.o., female   PCP: Riccardo Dubin, MD         Medical Service: Internal Medicine Teaching Service         Attending Physician: Dr. Oval Linsey, MD    First Contact: Dr. Zada Finders Pager: [phone]  Second Contact: Dr. Osa Craver Pager: [phone]       After Hours (After 5p/  First Contact Pager: [phone]  weekends / holidays): Second Contact Pager: [phone]   Chief Complaint: SOB, wheezing  History of Present Illness: [name] is a 65 year old lady with PMH of COPD on continous 3L [place]  (FEV1 28% pred, FEV1/FVC 47 per PFTs 08/2012), Osteoperosis, Iron deficiency anemia, depression, and bipolar disorder who presented with complaints of SOB. Patient states she woke at 3 am this morning with worsened SOB and increased wheezing and sputum production compared to her baseline. She reports green sputum, which is not a change in color from baseline. She tried her rescue inhaler without relief. She tried another treatment without any improvement. She lives at [facility] and has a roommate. She reports her ALF being quarantined for recent flu outbreak.  EMS was called and was given IV Solu-medrol and nebulizer treatment on route. In [place] ED she was started on an hour long continuous nebulizer treatment with improvement in symptoms. CXR was done which showed mild bibasilar opacities. She was given IV Azithromycin and Ceftriaxone due to concern of pneumonia. Patient then transferred to [facility] for overnight observation.  Meds: Current Facility-Administered Medications  Medication Dose Route Frequency Provider Last Rate Last Dose  . acetaminophen (TYLENOL) tablet 500 mg  500 mg Oral Q12H PRN Francesca Oman, DO      . albuterol (PROVENTIL) (2.5 MG/3ML) 0.083% nebulizer solution 5 mg  5 mg Nebulization Q2H PRN Rolland Porter, MD   5 mg at 05/06/15 1645  .  dextromethorphan-guaiFENesin (MUCINEX DM) 30-600 MG per 12 hr tablet 1 tablet  1 tablet Oral BID Francesca Oman, DO      . DULoxetine (CYMBALTA) DR capsule 40 mg  40 mg Oral QHS Francesca Oman, DO   40 mg at 05/06/15 2004  . enoxaparin (LOVENOX) injection 20 mg  20 mg Subcutaneous Q24H Francesca Oman, DO   20 mg at 05/06/15 2003  . feeding supplement (ENSURE ENLIVE) (ENSURE ENLIVE) liquid 237 mL  237 mL Oral TID BM Francesca Oman, DO   237 mL at 05/06/15 1900  . gabapentin (NEURONTIN) capsule 300 mg  300 mg Oral TID Francesca Oman, DO   300 mg at 05/06/15 2005  . ipratropium-albuterol (DUONEB) 0.5-2.5 (3) MG/3ML nebulizer solution 3 mL  3 mL Nebulization Q6H Francesca Oman, DO   3 mL at 05/06/15 1800  . lamoTRIgine (LAMICTAL) tablet 100 mg  100 mg Oral Daily Francesca Oman, DO   100 mg at 05/06/15 1739  . levofloxacin (LEVAQUIN) IVPB 500 mg  500 mg Intravenous Q24H Kendra P Hiatt, RPH      . loratadine (CLARITIN) tablet 10 mg  10 mg Oral Daily Francesca Oman, DO   10 mg at 05/06/15 1739  . Melatonin TABS 3 mg  3 mg Oral QHS PRN Francesca Oman, DO      . oxyCODONE (Oxy IR/ROXICODONE) immediate release tablet 10  mg  10 mg Oral TID Francesca Oman, DO   10 mg at 05/06/15 1734  . pantoprazole (PROTONIX) EC tablet 40 mg  40 mg Oral BID Francesca Oman, DO   40 mg at 05/06/15 2005  . polyethylene glycol (MIRALAX / GLYCOLAX) packet 17 g  17 g Oral Daily PRN Francesca Oman, DO      . Derrill Memo ON 05/07/2015] predniSONE (DELTASONE) tablet 40 mg  40 mg Oral Q breakfast Francesca Oman, DO      . senna [facility]) tablet 17.2 mg  2 tablet Oral TID PRN Francesca Oman, DO      . sodium chloride flush (NS) 0.9 % injection 3 mL  3 mL Intravenous Q12H Francesca Oman, DO   3 mL at 05/06/15 2006  . sodium chloride flush (NS) 0.9 % injection 3 mL  3 mL Intravenous PRN Francesca Oman, DO      . zolpidem [facility]) tablet 5 mg  5 mg Oral QHS PRN Francesca Oman, DO   5 mg at 05/06/15 2005    Allergies: Allergies as of 05/06/2015 - Review  Complete 05/06/2015  Allergen Reaction Noted  . Banana Shortness Of Breath and Nausea And Vomiting 01/10/2012  . Lyrica [pregabalin] Other (See Comments) 06/26/2014  . Pollen extract Other (See Comments) 11/20/2012   Past Medical History  Diagnosis Date  . Depression   . Suicidal ideation 2007     attempted overdose 2012  Dr. Tivis Ringer report  . Bipolar disorder ([place])   . Substance abuse      narcotics, alcohol, tobacco  . Chronic pain syndrome     follows at [facility]  . DEFICIENCY, VITAMIN D NOS 01/03/2007  . Chronic lower back pain   . Anxiety   . OSTEOPOROSIS 06/17/2009    DEXA 05/2009 : L femur -2.9; R femur -2.5. Alendronate on med list but not taking. Needs addressed ASAP as h/o fractures.    . Elevated liver function tests   . On home oxygen therapy     "3L; 24/7" (8/30//2016)  . Shortness of breath     "all the time" (02/27/2013)  . COPD (chronic obstructive pulmonary disease) (HCC)     oxygen dependent (3L home continuous)  . Sciatic pain   . Anemia   . Pneumonia X 1?  . Hyperthyroidism     "borderline"  . History of blood transfusion 03/2014; 10/2014    "low HgB" (11/11/2014)  . Duodenal ulcer, chronic   . Arthritis     "all in my body, primarily left hip" (11/11/2014)   Past Surgical History  Procedure Laterality Date  . Orif ankle fracture Left 10/2010  . Cesarean section  1974; 1979  . Tubal ligation  1979  . Augmentation mammaplasty  ~ 2007  . Fracture surgery    . Ankle debridement Left 10/2010  . Esophagogastroduodenoscopy N/A 11/07/2014    Procedure: ESOPHAGOGASTRODUODENOSCOPY (EGD);  Surgeon: Irene Shipper, MD;  Location: [facility] ENDOSCOPY;  Service: Endoscopy;  Laterality: N/A;   Family History  Problem Relation Age of Onset  . Stroke Neg Hx   . Cancer Neg Hx   . Myelodysplastic syndrome Father     Died from myelofibrosis though diagnosed post-mortem   Social History   Social History  . Marital Status: Divorced    Spouse Name: N/A  .  Number of Children: 2  . Years of Education: N/A   Occupational History  . retired    Science writer  History Main Topics  . Smoking status: Current Every Day Smoker -- 0.20 packs/day for 45 years    Types: Cigarettes  . Smokeless tobacco: Never Used     Comment: 11/11/2014 ""smoking 1 cigarettes/day; that's down from 2 ppd"  . Alcohol Use: 0.0 oz/week    0 Standard drinks or equivalent per week     Comment: 11/11/2014 "nothing in 7 years; never had a problem w/it"  . Drug Use: No  . Sexual Activity: No   Other Topics Concern  . 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 Negative for fever.  HENT: Positive for congestion.        Rhinorrhea, post-nasal drip  Eyes: Negative for pain.  Respiratory: Positive for cough, sputum production, shortness of breath and wheezing. Negative for hemoptysis.   Cardiovascular: Negative for chest pain, palpitations, orthopnea and leg swelling.  Gastrointestinal: Negative for nausea, vomiting, abdominal pain, diarrhea and constipation.       Decreased appetite  Genitourinary: Negative for dysuria.  Musculoskeletal: Negative for myalgias and falls.  Skin: Negative for rash.  Neurological: Negative for dizziness, focal weakness, loss of consciousness and headaches.  Psychiatric/Behavioral: Negative for substance abuse.     Physical Exam: Blood pressure 99/71, pulse 91, temperature 99 F (37.2 C), temperature source Oral, resp. rate 18, SpO2 96 %. Physical Exam  Constitutional: She is oriented to person, place, and time.  Thin, elderly woman, appears older than actual age  HENT:  Head: Normocephalic and atraumatic.  Mouth/Throat: Oropharynx is clear and  moist.  Eyes: EOM are normal. Pupils are equal, round, and reactive to light.  Neck: Normal range of motion. No tracheal deviation present.  Cardiovascular: Normal rate, regular rhythm and intact distal pulses.   No murmur heard. Distant heart sounds  Pulmonary/Chest: She exhibits no tenderness.  Distant breath sounds, faint end-expiratory wheezing throughout, rhonchous lower lung fields bilaterally  Abdominal: Soft. Bowel sounds are normal. She exhibits no distension. There is no tenderness.  Musculoskeletal: She exhibits no edema or tenderness.  Thin extremities  Neurological: She is alert and oriented to person, place, and time.  Skin: Skin is warm and dry.  Psychiatric: She has a normal mood and affect.     Lab results: Basic Metabolic Panel:  Recent Labs  05/06/15 0525  NA 132*  K 3.6  CL 92*  CO2 28  GLUCOSE 98  BUN 6  CREATININE <0.30*  CALCIUM 8.0*   Liver Function Tests: No results for input(s): AST, ALT, ALKPHOS, BILITOT, PROT, ALBUMIN in the last 72 hours. No results for input(s): LIPASE, AMYLASE in the last 72 hours. No results for input(s): AMMONIA in the last 72 hours. CBC:  Recent Labs  05/06/15 0550  WBC 8.5  NEUTROABS 6.8  HGB 11.6*  HCT 34.7*  MCV 89.0  PLT 367   Imaging results:  Dg Chest 2 View  05/06/2015  CLINICAL DATA:  Respiratory distress for 1 day. Initial encounter. EXAM: CHEST  2 VIEW COMPARISON:  PA and lateral chest 02/19/2015 and 10/16/2013. CT chest 10/16/2013. FINDINGS: The lungs are severely emphysematous but clear. Heart size is  normal. No pneumothorax or pleural effusion. IMPRESSION: Severe emphysema without acute disease. Electronically Signed   By: Inge Rise M.D.   On: 05/06/2015 18:40   Dg Chest Port 1 View  05/06/2015  CLINICAL DATA:  Acute onset of shortness of breath, cough and congestion. Initial encounter. EXAM: PORTABLE CHEST 1 VIEW COMPARISON:  Chest radiograph performed 02/19/2015 FINDINGS: The lungs are  hyperexpanded, with flattening of the hemidiaphragms, compatible with COPD. Mild bibasilar opacities raise concern for pneumonia. There is no evidence of pleural effusion or pneumothorax. The cardiomediastinal silhouette is within normal limits. No acute osseous abnormalities are seen. IMPRESSION: 1. Mild bibasilar airspace opacities raise concern for pneumonia. 2. Findings of COPD. Electronically Signed   By: Garald Balding M.D.   On: 05/06/2015 05:35   Assessment & Plan by Problem:  Ms. Jasey Shelton is a 65 year old lady with PMH of COPD on continous 3L [place]  (FEV1 28% pred, FEV1/FVC 47 per PFTs 08/2012), Osteoperosis, Iron deficiency anemia, depression, and bipolar disorder who presented with complaints of SOB.  Acute COPD exacerbation: Patient with end stage COPD, worsened wheezing and cough from baseline. She uses continuous O2 3L via [place] at her ALF [facility]).   Increased dyspnea since this AM and more sputum in the past two weeks c/w AECOPD.  Initial 1 view CXR with questionable B/L opacities so repeat 2 view obtained and no infiltrates to suggest pneumonia.  Also, afebrile without leukocytosis.  She responded well to steroid and duoneb and recevied ceftriaxone and azithromycin at [facility].  She has been back on home 3L since this AM and is feeling better. - admit for observation - levaquin for COPD exacerbation (this is 3rd COPD exacerbation in 3 months; she has been on azithromycin then doxycycline recently) - continue home 3L oxygen supplementation - duonebs q6h - prednisone 40mg  daily x 5 days - mucinex prn - check for flu  Bipolar:  Stable.  Continue Lamictal.  Chronic pain:  Stable.  Continue Cymbalta, gabapentin, oxycodone.  Constipation:  Prn Senokot, Miralax  Tobacco abuse:  She has cut back to two cigarettes per day.   - commended her on cutting back - continue to encourage cessation  Dispo: Disposition is deferred at this time, awaiting improvement of current medical problems.  Anticipated discharge in approximately 1 day(s).   The patient does have a current PCP (Rushil Sherrye Payor, MD) and does need an [facility] hospital follow-up appointment after discharge.  The patient does not have transportation limitations that hinder transportation to clinic appointments.  Signed: Zada Finders, MD 05/06/2015, 8:46 PM

## 2015-05-06 NOTE — Progress Notes (Signed)
ANTIBIOTIC CONSULT NOTE - INITIAL  Pharmacy Consult for Levofloxacin Indication: COPD exacerbation  Allergies  Allergen Reactions  . Banana Shortness Of Breath and Nausea And Vomiting  . Lyrica [Pregabalin] Other (See Comments)    "Disorients me"  . Pollen Extract Other (See Comments)    Seasonal     Patient Measurements:   Adjusted Body Weight:   Vital Signs: Temp: 99 F (37.2 C) (02/22 1404) Temp Source: Oral (02/22 1404) BP: 99/71 mmHg (02/22 1404) Pulse Rate: 91 (02/22 1404) Intake/Output from previous day:   Intake/Output from this shift:    Labs:  Recent Labs  05/06/15 0525 05/06/15 0550  WBC  --  8.5  HGB  --  11.6*  PLT  --  367  CREATININE <0.30*  --    CrCl cannot be calculated (Patient has no serum creatinine result on file.). No results for input(s): VANCOTROUGH, VANCOPEAK, VANCORANDOM, GENTTROUGH, GENTPEAK, GENTRANDOM, TOBRATROUGH, TOBRAPEAK, TOBRARND, AMIKACINPEAK, AMIKACINTROU, AMIKACIN in the last 72 hours.   Microbiology: No results found for this or any previous visit (from the past 720 hour(s)).  Medical History: Past Medical History  Diagnosis Date  . Depression   . Suicidal ideation 2007     attempted overdose 2012  Dr. Tivis Ringer report  . Bipolar disorder ([place])   . Substance abuse      narcotics, alcohol, tobacco  . Chronic pain syndrome     follows at [facility]  . DEFICIENCY, VITAMIN D NOS 01/03/2007  . Chronic lower back pain   . Anxiety   . OSTEOPOROSIS 06/17/2009    DEXA 05/2009 : L femur -2.9; R femur -2.5. Alendronate on med list but not taking. Needs addressed ASAP as h/o fractures.    . Elevated liver function tests   . On home oxygen therapy     "3L; 24/7" (8/30//2016)  . Shortness of breath     "all the time" (02/27/2013)  . COPD (chronic obstructive pulmonary disease) (HCC)     oxygen dependent (3L home continuous)  . Sciatic pain   . Anemia   . Pneumonia X 1?  . Hyperthyroidism     "borderline"  .  History of blood transfusion 03/2014; 10/2014    "low HgB" (11/11/2014)  . Duodenal ulcer, chronic   . Arthritis     "all in my body, primarily left hip" (11/11/2014)    Medications:  Scheduled:  . dextromethorphan-guaiFENesin  1 tablet Oral BID  . DULoxetine  40 mg Oral QHS  . enoxaparin (LOVENOX) injection  20 mg Subcutaneous Q24H  . feeding supplement (ENSURE ENLIVE)  237 mL Oral TID BM  . gabapentin  300 mg Oral TID  . ipratropium-albuterol  3 mL Nebulization Q6H  . lamoTRIgine  100 mg Oral Daily  . loratadine  10 mg Oral Daily  . oxyCODONE  10 mg Oral TID  . pantoprazole  40 mg Oral BID  . [START ON 05/07/2015] predniSONE  40 mg Oral Q breakfast  . sodium chloride flush  3 mL Intravenous Q12H   Assessment: 65yo female with COPD on home O2 3L, presenting with SOB and green sputum production.  She is to start Levofloxacin.  Pt is very small and Cr is likely not reflective of renal function.  Cr < 0.3 (38.5kg) WBC 8.5  Goal of Therapy:  treatment of infection  Plan:  Levofloxacin 500mg  IV q24 Watch renal fxn  Gracy Bruins, PharmD Clinical Pharmacist [facility]

## 2015-05-06 NOTE — ED Notes (Signed)
Bed: WA06 Expected date:  Expected time:  Means of arrival:  Comments: Short of breath

## 2015-05-06 NOTE — ED Notes (Signed)
carelink called  

## 2015-05-06 NOTE — ED Provider Notes (Signed)
CSN: [id number]     Arrival date & time 05/06/15  0434 History   First MD Initiated Contact with Patient 05/06/15 0505     Chief Complaint  Patient presents with  . Shortness of Breath    (Consider location/radiation/quality/duration/timing/severity/associated sxs/prior Treatment) HPI  Reports about 3 AM she woke up to go to the bathroom and she got very short of breath. She states she has been having shortness of breath and wheezing for the past few days. She states she has been using her nebulizer which helps briefly however it did not help much this morning. She also describes a cough with green sputum production and rhinorrhea that is also green. She has had a mild sore throat. She denies fever or chest pain but states her chest feels tight. Patient presents to the emergency department via EMS who gave her a nebulizer treatment and IV Solu-Medrol. Patient is on oxygen 3 L/m nasal cannula. Patient still smokes.    PCP Dr. Posey Pronto No pulmonologist  Past Medical History  Diagnosis Date  . Depression   . Suicidal ideation 2007     attempted overdose 2012  Dr. Tivis Ringer report  . Bipolar disorder ([place])   . Substance abuse      narcotics, alcohol, tobacco  . Chronic pain syndrome     follows at [facility]  . DEFICIENCY, VITAMIN D NOS 01/03/2007  . Chronic lower back pain   . Anxiety   . OSTEOPOROSIS 06/17/2009    DEXA 05/2009 : L femur -2.9; R femur -2.5. Alendronate on med list but not taking. Needs addressed ASAP as h/o fractures.    . Elevated liver function tests   . On home oxygen therapy     "3L; 24/7" (8/30//2016)  . Shortness of breath     "all the time" (02/27/2013)  . COPD (chronic obstructive pulmonary disease) (HCC)     oxygen dependent (3L home continuous)  . Sciatic pain   . Anemia   . Pneumonia X 1?  . Hyperthyroidism     "borderline"  . History of blood transfusion 03/2014; 10/2014    "low HgB" (11/11/2014)  . Duodenal ulcer, chronic   . Arthritis    "all in my body, primarily left hip" (11/11/2014)   Past Surgical History  Procedure Laterality Date  . Orif ankle fracture Left 10/2010  . Cesarean section  1974; 1979  . Tubal ligation  1979  . Augmentation mammaplasty  ~ 2007  . Fracture surgery    . Ankle debridement Left 10/2010  . Esophagogastroduodenoscopy N/A 11/07/2014    Procedure: ESOPHAGOGASTRODUODENOSCOPY (EGD);  Surgeon: Irene Shipper, MD;  Location: [facility] ENDOSCOPY;  Service: Endoscopy;  Laterality: N/A;   Family History  Problem Relation Age of Onset  . Stroke Neg Hx   . Cancer Neg Hx   . Myelodysplastic syndrome Father     Died from myelofibrosis though diagnosed post-mortem   Social History  Substance Use Topics  . Smoking status: Current Every Day Smoker -- 0.20 packs/day for 45 years    Types: Cigarettes  . Smokeless tobacco: Never Used     Comment: 11/11/2014 ""smoking 1 cigarettes/day; that's down from 2 ppd"  . Alcohol Use: 0.0 oz/week    0 Standard drinks or equivalent per week     Comment: 11/11/2014 "nothing in 7 years; never had a problem w/it"   Lives in ALF with a roommate Patient is on oxygen at 3 L/m nasal cannula.  Patient has cut down smoking from  2 packs a day to 2 cigarettes a day   OB History    No data available     Review of Systems  All other systems reviewed and are negative.     Allergies  Banana; Lyrica; and Pollen extract  Home Medications   Prior to Admission medications   Medication Sig Start Date End Date Taking? Authorizing Provider  acetaminophen (TYLENOL) 500 MG tablet Take 1-2 tablets (500-1,000 mg total) by mouth every 12 (twelve) hours as needed (Breakthrough pain). Patient taking differently: Take 500 mg by mouth every 12 (twelve) hours as needed (Breakthrough pain).  12/22/14 12/22/15 Yes Rushil Sherrye Payor, MD  alendronate (FOSAMAX) 70 MG tablet Take 1 tablet (70 mg total) by mouth once a week. Take with a full glass of water on an empty stomach. 10/16/14  Yes Rushil Sherrye Payor, MD  desloratadine (CLARINEX) 5 MG tablet Take 1 tablet (5 mg total) by mouth daily. 05/05/15 05/04/16 Yes Lucious Groves, DO  DULoxetine (CYMBALTA) 20 MG capsule Take 40 mg by mouth daily.   Yes Historical Provider, MD  ENSURE (ENSURE) Take 237 mLs by mouth 3 (three) times daily between meals.   Yes Historical Provider, MD  Fluticasone-Salmeterol (ADVAIR DISKUS) 250-50 MCG/DOSE AEPB INHALE 1 PUFF INTO THE LUNGS 2 TIMES DAILY 03/19/14  Yes Jones Bales, MD  gabapentin (NEURONTIN) 300 MG capsule Take 300 mg by mouth 3 (three) times daily.  06/25/14  Yes Historical Provider, MD  guaiFENesin-dextromethorphan (ROBITUSSIN DM) 100-10 MG/5ML syrup Take 5 mLs by mouth every 4 (four) hours as needed for cough. 02/04/15  Yes Rushil Sherrye Payor, MD  ipratropium-albuterol (DUONEB) 0.5-2.5 (3) MG/3ML SOLN 4 times a day (breakfast, lunch, dinner, bedtime).Can take 2 additional treatments if needed on bad days. DX J43.8 Patient taking differently: Take 3 mLs by mouth 4 (four) times daily.  07/03/14  Yes Kathee Delton, MD  lamoTRIgine (LAMICTAL) 100 MG tablet TAKE ONE TABLET EVERY DAY 09/23/14  Yes Sid Falcon, MD  Melatonin 10 MG TABS Take 1 tablet by mouth at bedtime as needed. Patient taking differently: Take 1 tablet by mouth at bedtime.  01/22/15  Yes Alexa Sherral Hammers, MD  Oxycodone HCl 10 MG TABS Take 10 mg by mouth 3 (three) times daily.  10/16/14  Yes Historical Provider, MD  pantoprazole (PROTONIX) 40 MG tablet Take 1 tablet (40 mg total) by mouth 2 (two) times daily. 11/09/14  Yes Burgess Estelle, MD  polyethylene glycol powder (GLYCOLAX/MIRALAX) powder TAKE 17G DISSOLVED IN 8OZ WATER DAILY ASNEEDED FOR MODERATE CONSTIPATION 09/23/14  Yes Sid Falcon, MD  PROAIR HFA 108 (90 BASE) MCG/ACT inhaler INHALE ONE PUFF BY MOUTH EVERY 2 TO 4 HOURS AS NEEDED FOR WHEEZING 05/21/14  Yes Rushil Sherrye Payor, MD  senna (SENOKOT) 8.6 MG TABS tablet Take 2 tablets (17.2 mg total) by mouth 3 (three) times daily as needed for mild  constipation. 11/13/14  Yes Jule Ser, DO  zolpidem (AMBIEN) 5 MG tablet Take 5 mg by mouth at bedtime as needed for sleep.   Yes Historical Provider, MD  fluticasone (FLONASE) 50 MCG/ACT nasal spray Place 1 spray into both nostrils daily. Patient not taking: Reported on 02/19/2015 06/13/14 06/13/15  Francesca Oman, DO  ondansetron (ZOFRAN-ODT) 4 MG disintegrating tablet Take 1 tablet (4 mg total) by mouth every 8 (eight) hours as needed for nausea or vomiting. Patient not taking: Reported on 12/05/2014 11/21/14   Ejiroghene Arlyce Dice, MD  OXYGEN Inhale 3 L into the  lungs continuous.    Historical Provider, MD  promethazine (PHENERGAN) 12.5 MG tablet Take 1 tablet (12.5 mg total) by mouth every 6 (six) hours as needed for nausea or vomiting. Patient not taking: Reported on 12/05/2014 11/24/14   Ejiroghene Arlyce Dice, MD  sucralfate (CARAFATE) 1 GM/10ML suspension Take 10 mLs (1 g total) by mouth 4 (four) times daily -  with meals and at bedtime. Patient not taking: Reported on 02/19/2015 12/05/14   Riccardo Dubin, MD   ED Triage Vitals  Enc Vitals Group     BP 05/06/15 0440 99/63 mmHg     Pulse Rate 05/06/15 0440 85     Resp 05/06/15 0440 13     Temp 05/06/15 0440 97.7 F (36.5 C)     Temp Source 05/06/15 0440 Oral     SpO2 05/06/15 0436 98 %     Weight --      Height --      Head Cir --      Peak Flow --      Pain Score 05/06/15 0441 8     Pain Loc --      Pain Edu? --      Excl. in [place]? --     Vital signs normal except for borderline hypotensino  Physical Exam  Constitutional: She is oriented to person, place, and time.  Non-toxic appearance. She does not appear ill. No distress.  Thin elderly female in no distress  HENT:  Head: Normocephalic and atraumatic.  Right Ear: External ear normal.  Left Ear: External ear normal.  Nose: Nose normal. No mucosal edema or rhinorrhea.  Mouth/Throat: Oropharynx is clear and moist and mucous membranes are normal. No dental abscesses or uvula  swelling.  Eyes: Conjunctivae and EOM are normal. Pupils are equal, round, and reactive to light.  Neck: Normal range of motion and full passive range of motion without pain. Neck supple.  Cardiovascular: Normal rate, regular rhythm and normal heart sounds.  Exam reveals no gallop and no friction rub.   No murmur heard. Pulmonary/Chest: Effort normal. No respiratory distress. She has decreased breath sounds. She has wheezes. She has no rhonchi. She has no rales. She exhibits no tenderness and no crepitus.  Patient has scattered diffuse wheezing and diminished breath sounds.  Abdominal: Soft. Normal appearance and bowel sounds are normal. She exhibits no distension. There is no tenderness. There is no rebound and no guarding.  Musculoskeletal: Normal range of motion. She exhibits no edema or tenderness.  Moves all extremities well.   Neurological: She is alert and oriented to person, place, and time. She has normal strength. No cranial nerve deficit.  Skin: Skin is warm, dry and intact. No rash noted. No erythema. No pallor.  Psychiatric: She has a normal mood and affect. Her speech is normal and behavior is normal. Her mood appears not anxious.  Nursing note and vitals reviewed.   ED Course  Procedures (including critical care time)  Medications  azithromycin (ZITHROMAX) 500 mg in dextrose 5 % 250 mL IVPB (not administered)  albuterol (PROVENTIL,VENTOLIN) solution continuous neb (15 mg/hr Nebulization Given 05/06/15 0526)  ipratropium (ATROVENT) nebulizer solution 0.5 mg (0.5 mg Nebulization Given 05/06/15 0525)  cefTRIAXone (ROCEPHIN) 1 g in dextrose 5 % 50 mL IVPB (1 g Intravenous New Bag/Given 05/06/15 0743)    Pt was started on a continuous nebulilizer, she was already given steroids by EMS.    7 AM after reviewing her x-ray she was started on community-acquired pneumonia  antibiotics. Patient was rechecked. She has finished her continuous nebulizer. She still has scattered wheezing  throughout her lungs. However they are improved in her air flow is improved. We discussed possible admission for IV antibiotics until she is doing better.  07:56 Dr Sherrye Payor, OPC,will have the admitting resident, Dr Redmond Pulling, call me back  08:07 Dr Junious Silk, [facility], accepts in transfer to [facility] to med-surgery bed, Dr Eppie Gibson, attending   Labs Review Results for orders placed or performed during the hospital encounter of [id number]  Basic metabolic panel  Result Value Ref Range   Sodium 132 (L) 135 - 145 mmol/L   Potassium 3.6 3.5 - 5.1 mmol/L   Chloride 92 (L) 101 - 111 mmol/L   CO2 28 22 - 32 mmol/L   Glucose, Bld 98 65 - 99 mg/dL   BUN 6 6 - 20 mg/dL   Creatinine, Ser <0.30 (L) 0.44 - 1.00 mg/dL   Calcium 8.0 (L) 8.9 - 10.3 mg/dL   GFR calc non Af Amer NOT CALCULATED >60 mL/min   GFR calc Af Amer NOT CALCULATED >60 mL/min   Anion gap 12 5 - 15  CBC with Differential/Platelet  Result Value Ref Range   WBC 8.5 4.0 - 10.5 K/uL   RBC 3.90 3.87 - 5.11 MIL/uL   Hemoglobin 11.6 (L) 12.0 - 15.0 g/dL   HCT 34.7 (L) 36.0 - 46.0 %   MCV 89.0 78.0 - 100.0 fL   MCH 29.7 26.0 - 34.0 pg   MCHC 33.4 30.0 - 36.0 g/dL   RDW 13.3 11.5 - 15.5 %   Platelets 367 150 - 400 K/uL   Neutrophils Relative % 80 %   Lymphocytes Relative 14 %   Monocytes Relative 6 %   Eosinophils Relative 0 %   Basophils Relative 0 %   Band Neutrophils 0 %   Metamyelocytes Relative 0 %   Myelocytes 0 %   Promyelocytes Absolute 0 %   Blasts 0 %   nRBC 0 0 /100 WBC   Other 0 %   Neutro Abs 6.8 1.7 - 7.7 K/uL   Lymphs Abs 1.2 0.7 - 4.0 K/uL   Monocytes Absolute 0.5 0.1 - 1.0 K/uL   Eosinophils Absolute 0.0 0.0 - 0.7 K/uL   Basophils Absolute 0.0 0.0 - 0.1 K/uL    Laboratory interpretation all normal except mild hyponatremia, low chloride and mild anemia    Imaging Review  Dg Chest Port 1 View  05/06/2015  CLINICAL DATA:  Acute onset of shortness of breath, cough and congestion. Initial encounter. EXAM: PORTABLE CHEST  1 VIEW COMPARISON:  Chest radiograph performed 02/19/2015 FINDINGS: The lungs are hyperexpanded, with flattening of the hemidiaphragms, compatible with COPD. Mild bibasilar opacities raise concern for pneumonia. There is no evidence of pleural effusion or pneumothorax. The cardiomediastinal silhouette is within normal limits. No acute osseous abnormalities are seen. IMPRESSION: 1. Mild bibasilar airspace opacities raise concern for pneumonia. 2. Findings of COPD. Electronically Signed   By: Garald Balding M.D.   On: 05/06/2015 05:35     I have personally reviewed and evaluated these images and lab results as part of my medical decision-making.    MDM   Final diagnoses:  COPD exacerbation ([place])  CAP (community acquired pneumonia)    Plan admission   Rolland Porter, MD, FACEP  CRITICAL CARE Performed by: Rolland Porter L Total critical care time: 34 minutes Critical care time was exclusive of separately billable procedures and treating other patients. Critical care was necessary  to treat or prevent imminent or life-threatening deterioration. Critical care was time spent personally by me on the following activities: development of treatment plan with patient and/or surrogate as well as nursing, discussions with consultants, evaluation of patient's response to treatment, examination of patient, obtaining history from patient or surrogate, ordering and performing treatments and interventions, ordering and review of laboratory studies, ordering and review of radiographic studies, pulse oximetry and re-evaluation of patient's condition.      Rolland Porter, MD 05/06/15 [phone]

## 2015-05-07 DIAGNOSIS — F17210 Nicotine dependence, cigarettes, uncomplicated: Secondary | ICD-10-CM

## 2015-05-07 DIAGNOSIS — G8929 Other chronic pain: Secondary | ICD-10-CM | POA: Diagnosis not present

## 2015-05-07 DIAGNOSIS — Z9981 Dependence on supplemental oxygen: Secondary | ICD-10-CM | POA: Diagnosis not present

## 2015-05-07 DIAGNOSIS — J441 Chronic obstructive pulmonary disease with (acute) exacerbation: Secondary | ICD-10-CM

## 2015-05-07 DIAGNOSIS — F319 Bipolar disorder, unspecified: Secondary | ICD-10-CM

## 2015-05-07 DIAGNOSIS — K5900 Constipation, unspecified: Secondary | ICD-10-CM

## 2015-05-07 LAB — INFLUENZA PANEL BY PCR (TYPE A & B)
H1N1FLUPCR: NOT DETECTED
INFLBPCR: NEGATIVE
Influenza A By PCR: NEGATIVE

## 2015-05-07 MED ORDER — IPRATROPIUM-ALBUTEROL 0.5-2.5 (3) MG/3ML IN SOLN
3.0000 mL | Freq: Four times a day (QID) | RESPIRATORY_TRACT | Status: DC
  Administered 2015-05-07 – 2015-05-08 (×5): 3 mL via RESPIRATORY_TRACT
  Filled 2015-05-07 (×5): qty 3

## 2015-05-07 MED ORDER — LEVOFLOXACIN 750 MG PO TABS
750.0000 mg | ORAL_TABLET | Freq: Every day | ORAL | Status: DC
  Administered 2015-05-07 – 2015-05-08 (×2): 750 mg via ORAL
  Filled 2015-05-07 (×2): qty 1

## 2015-05-07 MED ORDER — ALBUTEROL SULFATE (2.5 MG/3ML) 0.083% IN NEBU: 2.5000 mg | INHALATION_SOLUTION | Freq: Four times a day (QID) | RESPIRATORY_TRACT | Status: DC | PRN

## 2015-05-07 MED ORDER — OXYCODONE HCL 5 MG PO TABS
10.0000 mg | ORAL_TABLET | Freq: Three times a day (TID) | ORAL | Status: DC | PRN
  Administered 2015-05-07 – 2015-05-08 (×4): 10 mg via ORAL
  Filled 2015-05-07 (×4): qty 2

## 2015-05-07 MED ORDER — OXYCODONE HCL 5 MG PO TABS: 10.0000 mg | ORAL_TABLET | Freq: Two times a day (BID) | ORAL | Status: DC | PRN

## 2015-05-07 NOTE — Progress Notes (Addendum)
Pt resting in bed, no c/o. PT to come by for consult, pt states she will work with them when they come, will have PT assess pt's sats when she walks with them. Encouraged pt to drink fluids for hydration, she states she ate breakfast.

## 2015-05-07 NOTE — H&P (Signed)
Internal Medicine Attending Admission Note Date: 05/07/2015  Patient name: [name] Medical record number: [id number] Date of birth: June 06, 1950 Age: 65 y.o. Gender: female  I saw and evaluated the patient. I reviewed the resident's note and I agree with the resident's findings and plan as documented in the resident's note.  Chief Complaint(s): Shortness of breath.  History - key components related to admission:  [name] is a 65 year old woman with a history of severe chronic obstructive pulmonary disease requiring 3 L of nasal cannula home oxygen therapy, depression, bipolar disorder, and osteoporosis who presents with the acute onset of shortness of breath on the morning of admission. She was in her usual state of health until the morning of admission when she was awoken from her sleep at 3 AM with shortness of breath associated with increased wheezing and cough productive of a green sputum that is greater in volume than baseline. She did not respond to a nebulized treatment and therefore was transported to the emergency department for further evaluation. She denied any fevers, shakes, chills, or chest pain. She continues to smoke. She was admitted to the internal medicine teaching service for further evaluation and care.  On rounds the morning following admission she noted that she felt better with the antibiotics, prednisone, and bronchodilators but that with any movement, including to the side of the bed, she became extremely dyspneic. Thus, despite improvement, she is not near her baseline.  Physical Exam - key components related to admission:  Filed Vitals:   05/07/15 0510 05/07/15 0810 05/07/15 1035 05/07/15 1055  BP: 96/57 116/67  143/89  Pulse: 84 90  103  Temp: 98.4 F (36.9 C) 98.1 F (36.7 C)  98.4 F (36.9 C)  TempSrc: Oral Oral  Oral  Resp: 18 16  16   SpO2: 97% 97% 98% 92%   Gen.: Well-developed, thin, woman who appears older than her stated age sitting comfortably  in bed in no acute distress. She is not dyspneic with conversation. Lungs: Diffuse expiratory wheezes. Extremities: Without edema.  Lab results:  Basic Metabolic Panel:  Recent Labs  05/06/15 0525  NA 132*  K 3.6  CL 92*  CO2 28  GLUCOSE 98  BUN 6  CREATININE <0.30*  CALCIUM 8.0*   CBC:  Recent Labs  05/06/15 0550  WBC 8.5  NEUTROABS 6.8  HGB 11.6*  HCT 34.7*  MCV 89.0  PLT 367   Misc. Labs:  Influenza A, influenza B, H1N1 flu negative by PCR  Imaging results:  Dg Chest 2 View  05/06/2015  CLINICAL DATA:  Respiratory distress for 1 day. Initial encounter. EXAM: CHEST  2 VIEW COMPARISON:  PA and lateral chest 02/19/2015 and 10/16/2013. CT chest 10/16/2013. FINDINGS: The lungs are severely emphysematous but clear. Heart size is normal. No pneumothorax or pleural effusion. IMPRESSION: Severe emphysema without acute disease. Electronically Signed   By: Inge Rise M.D.   On: 05/06/2015 18:40   Dg Chest Port 1 View  05/06/2015  CLINICAL DATA:  Acute onset of shortness of breath, cough and congestion. Initial encounter. EXAM: PORTABLE CHEST 1 VIEW COMPARISON:  Chest radiograph performed 02/19/2015 FINDINGS: The lungs are hyperexpanded, with flattening of the hemidiaphragms, compatible with COPD. Mild bibasilar opacities raise concern for pneumonia. There is no evidence of pleural effusion or pneumothorax. The cardiomediastinal silhouette is within normal limits. No acute osseous abnormalities are seen. IMPRESSION: 1. Mild bibasilar airspace opacities raise concern for pneumonia. 2. Findings of COPD. Electronically Signed   By: Jacqulynn Cadet  Chang M.D.   On: 05/06/2015 05:35   Assessment & Plan by Problem:  [name] is a 65 year old woman with a history of severe chronic obstructive pulmonary disease requiring 3 L of nasal cannula home oxygen therapy, depression, bipolar disorder, and osteoporosis who presents with the acute onset of shortness of breath on the morning of  admission. Given her long history of severe COPD requiring home oxygen therapy, her continued smoking, and significant obstruction on exam her presentation is most consistent with a COPD exacerbation.  1) COPD exacerbation: We will treat with antibiotics, prednisone, and bronchodilators. We will reassess her clinical symptomatology and examination in the morning. We will continue to stress the importance of tobacco cessation in order to halt the progression of her COPD.  2) Disposition: I anticipate she will be closer to baseline tomorrow morning and should be stable for transfer back to her assisted living facility if she is able to ambulate short distances.

## 2015-05-07 NOTE — Care Management Note (Signed)
Case Management Note  Patient Details  Name: [name] MRN: [id number] Date of Birth: 10-Sep-1950  Subjective/Objective:                    Action/Plan:  Spoke with patient , she plans to return to [facility] assisted living . Await PT eval  Expected Discharge Date:   (UNKNOWN)               Expected Discharge Plan:  Assisted Living / Rest Home  In-House Referral:  Clinical Social Work  Discharge planning Services     Post Acute Care Choice:    Choice offered to:  Patient  DME Arranged:    DME Agency:     HH Arranged:    [place] Agency:     Status of Service:     Medicare Important Message Given:    Date Medicare IM Given:    Medicare IM give by:    Date Additional Medicare IM Given:    Additional Medicare Important Message give by:     If discussed at [place] of Stay Meetings, dates discussed:    Additional Comments:  Marilu Favre, RN 05/07/2015, 2:31 PM

## 2015-05-07 NOTE — Progress Notes (Signed)
Pt up with assist washing up at bathroom sink sitting down, pt c/o feeling dizzy, short of breath and  weak, reported by student and NT that pt got pale in color. Assisted back to bed, VS 143/89, 103, 91-92% 3L,     . Pt stated she felt better once in bed, color back to normal. Pt resting at this time.

## 2015-05-07 NOTE — Progress Notes (Signed)
   Subjective: Patient feels her breathing is improving, has continued weakness walking short distances. No acute events overnight. Objective: Vital signs in last 24 hours: Filed Vitals:   05/07/15 0810 05/07/15 1035 05/07/15 1055 05/07/15 1219  BP: 116/67  143/89 105/65  Pulse: 90  103 88  Temp: 98.1 F (36.7 C)  98.4 F (36.9 C) 98.2 F (36.8 C)  TempSrc: Oral  Oral Oral  Resp: 16  16 18   SpO2: 97% 98% 92% 98%   Weight change:   Intake/Output Summary (Last 24 hours) at 05/07/15 1345 Last data filed at 05/07/15 0900  Gross per 24 hour  Intake    960 ml  Output    750 ml  Net    210 ml   General: thin elderly woman, resting in bed, on 3L [place] Cardiac: RRR, no rubs, murmurs or gallops Pulm: expiratory wheezing throughout lung fields, some improvement compared to yesterday Abd: soft, nontender, nondistended, BS present Ext: thin extremities, no pedal edema Neuro: alert and oriented X3  Assessment/Plan: Principal Problem:   Acute exacerbation of chronic obstructive pulmonary disease (COPD) (HCC) Active Problems:   Osteoporosis   Bipolar I disorder, most recent episode depressed (HCC)   Insomnia   Iron deficiency anemia   Smoking   Constipation  [name] is a 65 year old lady with PMH of COPD on continous 3L [place] (FEV1 28% pred, FEV1/FVC 47 per PFTs 08/2012), Osteoperosis, Iron deficiency anemia, depression, and bipolar disorder who presented with complaints of SOB.  Acute COPD exacerbation: Patient reports continued improvement with regards to her breathing overnight. Her main complaint today is weakness when trying to ambulate/get up to use the bathroom. We will continue current treatment and have PT work with her today. Will reassess tomorrow morning, hopeful for continued improvement and discharge back to her ALF. - IV levaquin for COPD exacerbation -> switch to PO 750 mg daily - continue home 3L oxygen supplementation - duonebs q6h - prednisone 40mg  daily x 5  days - mucinex prn - PT eval  Bipolar: Stable. Continue Lamictal.  Chronic pain: Stable. Continue Cymbalta, gabapentin, oxycodone.  Constipation: Prn Senokot, Miralax  Tobacco abuse: She has cut back to two cigarettes per day.  - continue to encourage cessation   Dispo:   Anticipated discharge tomorrow if able to walk short distances using her walker and continued symptomatic improvement.      Zada Finders, MD 05/07/2015, 1:45 PM

## 2015-05-07 NOTE — Progress Notes (Signed)
Internal Medicine Clinic Attending  Case discussed with Dr. Hoffman soon after the resident saw the patient.  We reviewed the resident's history and exam and pertinent patient test results.  I agree with the assessment, diagnosis, and plan of care documented in the resident's note. 

## 2015-05-07 NOTE — Care Management Obs Status (Signed)
[place] NOTIFICATION   Patient Details  Name: [name] MRN: [id number] Date of Birth: 02-14-1951   Medicare Observation Status Notification Given:  Yes    Marilu Favre, RN 05/07/2015, 2:30 PM

## 2015-05-08 DIAGNOSIS — J441 Chronic obstructive pulmonary disease with (acute) exacerbation: Secondary | ICD-10-CM | POA: Diagnosis not present

## 2015-05-08 DIAGNOSIS — I509 Heart failure, unspecified: Secondary | ICD-10-CM | POA: Diagnosis not present

## 2015-05-08 MED ORDER — PREDNISONE 20 MG PO TABS: 40.0000 mg | ORAL_TABLET | Freq: Every day | ORAL | Status: AC

## 2015-05-08 MED ORDER — LEVOFLOXACIN 750 MG PO TABS: 750.0000 mg | ORAL_TABLET | Freq: Every day | ORAL | Status: AC

## 2015-05-08 MED ORDER — AEROCHAMBER Z-STAT PLUS/MEDIUM MISC: Status: DC

## 2015-05-08 MED ORDER — MOMETASONE FURO-FORMOTEROL FUM 200-5 MCG/ACT IN AERO
2.0000 | INHALATION_SPRAY | Freq: Two times a day (BID) | RESPIRATORY_TRACT | Status: DC
  Filled 2015-05-08: qty 8.8

## 2015-05-08 MED ORDER — AEROCHAMBER Z-STAT PLUS/MEDIUM MISC
1.0000 | Freq: Once | Status: AC
  Administered 2015-05-08: 1
  Filled 2015-05-08: qty 1

## 2015-05-08 NOTE — Progress Notes (Signed)
CSW spoke with Regulatory affairs officer at [facility] regarding patient's return. Per Evelena Peat, patient can return to facility once medically stable and ready for discharge. FL2 and DC Summary to be sent to facility once available.   CSW contacted MD to inform, however patient may need a higher level of care. Awaiting PT evaluation. CSW contacted [place] Development worker, international aid at [place] to inform. Per [place], please send PT eval and updated information regarding the patient once available.   CSW will continue to follow and provide support to patient while in hospital.   Lucius Conn, [place] Worker [facility] Ph: [phone]

## 2015-05-08 NOTE — Discharge Instructions (Signed)
Please continue to use your breathing treatments as prescribed. We have provided a medical letter requesting to allow you to have your nebulizer medication and machine in your room.  We are providing a spacer to use with your rescue inhaler.  Please complete the Prednisone and Levaquin (levofloxacin) antibiotic as prescribed.  I have also ordered a wheelchair and home health physical therapy at your facility.

## 2015-05-08 NOTE — Evaluation (Signed)
Physical Therapy Evaluation Patient Details Name: [name] MRN: [id number] DOB: December 29, 1950 Today's Date: 05/08/2015   History of Present Illness  Patient is a 65 yo female admitted 05/06/15 with COPD exacerbation.   PMH:  COPD on O2 at 3 l/min baseline, osteoporosis, anxiety, bipolar disorder  Clinical Impression  Patient presents with problems listed below.  Will benefit from acute PT to maximize functional mobility prior to discharge.  Patient with general weakness and decreased activity tolerance.  Recommend f/u HHPT at ALF.  Also recommend w/c to get to/from dining room.    Follow Up Recommendations Home health PT;Supervision - Intermittent    Equipment Recommendations  Wheelchair (measurements PT);Wheelchair cushion (measurements PT)    Recommendations for Other Services       Precautions / Restrictions Precautions Precautions: None Precaution Comments: On O2 at 3 l/min Restrictions Weight Bearing Restrictions: No      Mobility  Bed Mobility Overal bed mobility: Modified Independent                Transfers Overall transfer level: Modified independent Equipment used: Rolling walker (2 wheeled)             General transfer comment: No physical assist required for transfers.  Ambulation/Gait Ambulation/Gait assistance: Min guard Ambulation Distance (Feet): 80 Feet Assistive device: Rolling walker (2 wheeled) Gait Pattern/deviations: Step-through pattern;Decreased stride length;Trunk flexed Gait velocity: decreased Gait velocity interpretation: Below normal speed for age/gender General Gait Details: Slow, guarded gait with flexed posture.  Cues to stand upright and look forward during gait.  Stairs            Wheelchair Mobility    Modified Rankin (Stroke Patients Only)       Balance Overall balance assessment: Needs assistance         Standing balance support: Bilateral upper extremity supported;During functional  activity Standing balance-Leahy Scale: Poor                               Pertinent Vitals/Pain Pain Assessment: Faces Faces Pain Scale: Hurts even more Pain Location: back and legs Pain Descriptors / Indicators: Constant;Aching Pain Intervention(s): Monitored during session;Repositioned    Home Living Family/patient expects to be discharged to:: Assisted living               Home Equipment: Walker - 4 wheels;Shower seat - built in (Tall toilet)      Prior Function Level of Independence: Independent with assistive device(s);Needs assistance   Gait / Transfers Assistance Needed: Patient ambulates with rollator.  Has to walk to dining room for meals.  ADL's / Homemaking Assistance Needed: Facility provides meals in dining room.  Provides assist for bathing and housekeeping.  Patient reports needs assist with dressing/ADL's.        Hand Dominance   Dominant Hand: Right    Extremity/Trunk Assessment   Upper Extremity Assessment: Generalized weakness           Lower Extremity Assessment: Generalized weakness         Communication   Communication: No difficulties  Cognition Arousal/Alertness: Awake/alert Behavior During Therapy: Anxious;Agitated;Flat affect Overall Cognitive Status: Within Functional Limits for tasks assessed                      General Comments      Exercises        Assessment/Plan    PT Assessment Patient needs continued PT services;All further  PT needs can be met in the next venue of care  PT Diagnosis Difficulty walking;Generalized weakness;Acute pain   PT Problem List Decreased strength;Decreased activity tolerance;Decreased balance;Decreased mobility;Cardiopulmonary status limiting activity;Pain  PT Treatment Interventions DME instruction;Gait training;Functional mobility training;Therapeutic activities;Therapeutic exercise;Balance training;Patient/family education   PT Goals (Current goals can be found in  the Care Plan section) Acute Rehab PT Goals Patient Stated Goal: To get stronger PT Goal Formulation: With patient Time For Goal Achievement: 05/15/15 Potential to Achieve Goals: Good    Frequency Min 3X/week   Barriers to discharge        Co-evaluation               End of Session Equipment Utilized During Treatment: Gait belt;Oxygen Activity Tolerance: Patient limited by fatigue;Patient limited by pain Patient left: in bed;with call bell/phone within reach      Functional Assessment Tool Used: Clinical judgement Functional Limitation: Mobility: Walking and moving around Mobility: Walking and Moving Around Current Status (G2542): At least 1 percent but less than 20 percent impaired, limited or restricted Mobility: Walking and Moving Around Goal Status [phone]): 0 percent impaired, limited or restricted    Time: 7628-3151 PT Time Calculation (min) (ACUTE ONLY): 30 min   Charges:   PT Evaluation $PT Eval Moderate Complexity: 1 Procedure PT Treatments $Gait Training: 8-22 mins   PT G Codes:   PT G-Codes **NOT FOR INPATIENT CLASS** Functional Assessment Tool Used: Clinical judgement Functional Limitation: Mobility: Walking and moving around Mobility: Walking and Moving Around Current Status (V6160): At least 1 percent but less than 20 percent impaired, limited or restricted Mobility: Walking and Moving Around Goal Status [phone]): 0 percent impaired, limited or restricted    Despina Pole 05/08/2015, 2:52 PM Carita Pian. Sanjuana Kava, [place] Pager [phone]

## 2015-05-08 NOTE — NC FL2 (Signed)
[place] LEVEL OF CARE SCREENING TOOL     IDENTIFICATION  Patient Name: [name] Birthdate: October 22, 1950 Sex: female Admission Date (Current Location): 05/06/2015  [facility] and [place] Number:  Herbalist and Address:  The . [facility], [place] [address], [place], [place] [address]      Provider Number: [id number]  Attending Physician Name and Address:  Oval Linsey, MD  Relative Name and Phone Number:       Current Level of Care: Hospital Recommended Level of Care: [place] Prior Approval Number:    Date Approved/Denied:   PASRR Number:    Discharge Plan: Other (Comment) [facility])    Current Diagnoses: Patient Active Problem List   Diagnosis Date Noted  . Acute exacerbation of chronic obstructive pulmonary disease (COPD) ([place]) 05/06/2015  . COPD exacerbation ([place]) 01/22/2015  . Constipation   . Orthostatic hypotension 11/11/2014  . Duodenal ulcer   . Polypharmacy 10/24/2014  . Smoking 10/24/2014  . Trochanteric bursitis of left hip 10/01/2014  . Rash and nonspecific skin eruption 09/17/2014  . Left hip pain 09/17/2014  . Immunization, tetanus-diphtheria 09/05/2014  . Iron deficiency anemia 11/12/2013  . Health care maintenance 05/14/2013  . Nausea and vomiting 04/17/2013  . Insomnia 03/06/2013  . GERD (gastroesophageal reflux disease) 03/06/2013  . Protein-calorie malnutrition, severe ([place]) 03/04/2013  . Chronic pain 03/02/2013  . Dyslipidemia 02/26/2013  . Seasonal allergies 07/09/2012  . COPD (chronic obstructive pulmonary disease) with emphysema ([place]) 03/01/2011  . Osteoporosis 06/17/2009  . Bipolar I disorder, most recent episode depressed ([place]) 06/02/2009  . Vitamin D deficiency 01/03/2007  . BACK PAIN, LUMBAR 12/07/2005    Orientation RESPIRATION BLADDER Height & Weight     Self, Time, Situation, Place  O2 (3 Liters) Continent Weight: 89 lb 15.2 oz (40.8 kg) Height:      BEHAVIORAL SYMPTOMS/MOOD NEUROLOGICAL BOWEL NUTRITION STATUS      Continent Diet (Regular)  AMBULATORY STATUS COMMUNICATION OF NEEDS Skin   Limited Assist Verbally Normal                       Personal Care Assistance Level of Assistance  Bathing, Feeding, Dressing Bathing Assistance: Limited assistance Feeding assistance: Independent Dressing Assistance: Limited assistance     Functional Limitations Info  Sight, Hearing, Speech Sight Info: Adequate Hearing Info: Adequate Speech Info: Adequate    SPECIAL CARE FACTORS FREQUENCY  PT (By licensed PT)                    Contractures      Additional Factors Info  Code Status, Allergies Code Status Info: DNR Allergies Info: Banana, Lyrica, Pollen Extract           Current Medications (05/08/2015):  This is the current hospital active medication list Current Facility-Administered Medications  Medication Dose Route Frequency Provider Last Rate Last Dose  . acetaminophen (TYLENOL) tablet 500 mg  500 mg Oral Q12H PRN Francesca Oman, DO      . albuterol (PROVENTIL) (2.5 MG/3ML) 0.083% nebulizer solution 2.5 mg  2.5 mg Nebulization Q6H PRN Oval Linsey, MD      . dextromethorphan-guaiFENesin [facility] DM) 30-600 MG per 12 hr tablet 1 tablet  1 tablet Oral BID Francesca Oman, DO   1 tablet at 05/08/15 1106  . DULoxetine (CYMBALTA) DR capsule 40 mg  40 mg Oral QHS Francesca Oman, DO   40 mg at 05/07/15 2153  .  enoxaparin (LOVENOX) injection 20 mg  20 mg Subcutaneous Q24H Francesca Oman, DO   20 mg at 05/07/15 2052  . feeding supplement (ENSURE ENLIVE) (ENSURE ENLIVE) liquid 237 mL  237 mL Oral TID BM Francesca Oman, DO   237 mL at 05/06/15 1900  . gabapentin (NEURONTIN) capsule 300 mg  300 mg Oral TID Francesca Oman, DO   300 mg at 05/08/15 1107  . ipratropium-albuterol (DUONEB) 0.5-2.5 (3) MG/3ML nebulizer solution 3 mL  3 mL Nebulization QID Oval Linsey, MD   3 mL at 05/08/15 0739  . lamoTRIgine (LAMICTAL) tablet 100 mg   100 mg Oral Daily Francesca Oman, DO   100 mg at 05/08/15 1106  . levofloxacin (LEVAQUIN) tablet 750 mg  750 mg Oral Daily Iline Oven, MD   750 mg at 05/08/15 1107  . loratadine (CLARITIN) tablet 10 mg  10 mg Oral Daily Francesca Oman, DO   10 mg at 05/08/15 1107  . Melatonin TABS 3 mg  3 mg Oral QHS PRN Francesca Oman, DO      . mometasone-formoterol (DULERA) 200-5 MCG/ACT inhaler 2 puff  2 puff Inhalation BID Alexa Sherral Hammers, MD      . oxyCODONE (Oxy IR/ROXICODONE) immediate release tablet 10 mg  10 mg Oral Q8H PRN Zada Finders, MD   10 mg at 05/08/15 [id number]  . pantoprazole (PROTONIX) EC tablet 40 mg  40 mg Oral BID Francesca Oman, DO   40 mg at 05/08/15 1106  . polyethylene glycol (MIRALAX / GLYCOLAX) packet 17 g  17 g Oral Daily PRN Francesca Oman, DO      . predniSONE (DELTASONE) tablet 40 mg  40 mg Oral Q breakfast Francesca Oman, DO   40 mg at 05/08/15 0739  . senna (SENOKOT) tablet 17.2 mg  2 tablet Oral TID PRN Francesca Oman, DO      . sodium chloride flush (NS) 0.9 % injection 3 mL  3 mL Intravenous Q12H Francesca Oman, DO   3 mL at 05/08/15 1109  . sodium chloride flush (NS) 0.9 % injection 3 mL  3 mL Intravenous PRN Francesca Oman, DO      . zolpidem [facility]) tablet 5 mg  5 mg Oral QHS PRN Francesca Oman, DO   5 mg at 05/07/15 2154     Discharge Medications: Please see discharge summary for a list of discharge medications.  Relevant Imaging Results:  Relevant Lab Results:   Additional Information SS#: [id number]  Raymondo Band, LCSW

## 2015-05-08 NOTE — Discharge Summary (Signed)
Name: [name] MRN: [id number] DOB: 18-Mar-1950 65 y.o. PCP: Riccardo Dubin, MD  Date of Admission: 05/06/2015  4:40 AM Date of Discharge: 05/08/2015 Attending Physician: Oval Linsey, MD  Discharge Diagnosis: 1. Acute exacerbation of COPD Principal Problem:   Acute exacerbation of chronic obstructive pulmonary disease (COPD) ([place]) Active Problems:   Osteoporosis   Bipolar I disorder, most recent episode depressed (HCC)   Insomnia   Iron deficiency anemia   Smoking   Constipation  Discharge Medications:   Medication List    STOP taking these medications        promethazine 12.5 MG tablet  Commonly known as:  PHENERGAN     sucralfate 1 GM/10ML suspension  Commonly known as:  CARAFATE      TAKE these medications        acetaminophen 500 MG tablet  Commonly known as:  TYLENOL  Take 1-2 tablets (500-1,000 mg total) by mouth every 12 (twelve) hours as needed (Breakthrough pain).     aerochamber Z-Stat Plus/medium inhaler  Use with rescue inhaler     alendronate 70 MG tablet  Commonly known as:  FOSAMAX  Take 1 tablet (70 mg total) by mouth once a week. Take with a full glass of water on an empty stomach.     desloratadine 5 MG tablet  Commonly known as:  CLARINEX  Take 1 tablet (5 mg total) by mouth daily.     DULoxetine 20 MG capsule  Commonly known as:  CYMBALTA  Take 40 mg by mouth daily.     ENSURE  Take 237 mLs by mouth 3 (three) times daily between meals.     fluticasone 50 MCG/ACT nasal spray  Commonly known as:  FLONASE  Place 1 spray into both nostrils daily.     Fluticasone-Salmeterol 250-50 MCG/DOSE Aepb  Commonly known as:  ADVAIR DISKUS  INHALE 1 PUFF INTO THE LUNGS 2 TIMES DAILY     gabapentin 300 MG capsule  Commonly known as:  NEURONTIN  Take 300 mg by mouth 3 (three) times daily.     guaiFENesin-dextromethorphan 100-10 MG/5ML syrup  Commonly known as:  ROBITUSSIN DM  Take 5 mLs by mouth every 4 (four) hours as needed for  cough.     ipratropium-albuterol 0.5-2.5 (3) MG/3ML Soln  Commonly known as:  DUONEB  4 times a day (breakfast, lunch, dinner, bedtime).Can take 2 additional treatments if needed on bad days. DX J43.8     lamoTRIgine 100 MG tablet  Commonly known as:  LAMICTAL  TAKE ONE TABLET EVERY DAY     levofloxacin 750 MG tablet  Commonly known as:  LEVAQUIN  Take 1 tablet (750 mg total) by mouth daily.  Start taking on:  05/09/2015     Melatonin 10 MG Tabs  Take 1 tablet by mouth at bedtime as needed.     ondansetron 4 MG disintegrating tablet  Commonly known as:  ZOFRAN-ODT  Take 1 tablet (4 mg total) by mouth every 8 (eight) hours as needed for nausea or vomiting.     Oxycodone HCl 10 MG Tabs  Take 10 mg by mouth 3 (three) times daily.     OXYGEN  Inhale 3 L into the lungs continuous.     pantoprazole 40 MG tablet  Commonly known as:  PROTONIX  Take 1 tablet (40 mg total) by mouth 2 (two) times daily.     polyethylene glycol powder powder  Commonly known as:  GLYCOLAX/MIRALAX  TAKE 17G DISSOLVED IN 8OZ  WATER DAILY ASNEEDED FOR MODERATE CONSTIPATION     predniSONE 20 MG tablet  Commonly known as:  DELTASONE  Take 2 tablets (40 mg total) by mouth daily with breakfast.  Start taking on:  05/09/2015     PROAIR HFA 108 (90 Base) MCG/ACT inhaler  Generic drug:  albuterol  INHALE ONE PUFF BY MOUTH EVERY 2 TO 4 HOURS AS NEEDED FOR WHEEZING     senna 8.6 MG Tabs tablet  Commonly known as:  SENOKOT  Take 2 tablets (17.2 mg total) by mouth 3 (three) times daily as needed for mild constipation.     zolpidem 5 MG tablet  Commonly known as:  AMBIEN  Take 5 mg by mouth at bedtime as needed for sleep.        Disposition and follow-up:   Ms.Malachi H [name] was discharged from [facility] in Stable condition.  At the hospital follow up visit please address:  1.   Patient needs nebulizer medications and machine in her room for as needed use, medical letter provided to  ALF for this.  Patient provided spacer to use with rescue inhaler, assess whether she still has it and able to use properly.  May need higher level of care than she is able to receive at ALF, consider process to transition to nursing facility. Patient requests/requires facility that allows smoking.  2.  Labs / imaging needed at time of follow-up: none  3.  Pending labs/ test needing follow-up: none  Follow-up Appointments: Follow-up Information    Follow up with Jenetta Downer, MD. Go on 05/19/2015.   Specialty:  Internal Medicine   Why:  At 3:15 pm.   Contact information:   [place] [address] [phone]       Discharge Instructions: Discharge Instructions    Call MD for:  difficulty breathing, headache or visual disturbances    Complete by:  As directed      Call MD for:  extreme fatigue    Complete by:  As directed      Call MD for:  persistant dizziness or light-headedness    Complete by:  As directed      Call MD for:  temperature >100.4    Complete by:  As directed      Diet - low sodium heart healthy    Complete by:  As directed      Increase activity slowly    Complete by:  As directed            Consultations:    Procedures Performed:  Dg Chest 2 View  05/06/2015  CLINICAL DATA:  Respiratory distress for 1 day. Initial encounter. EXAM: CHEST  2 VIEW COMPARISON:  PA and lateral chest 02/19/2015 and 10/16/2013. CT chest 10/16/2013. FINDINGS: The lungs are severely emphysematous but clear. Heart size is normal. No pneumothorax or pleural effusion. IMPRESSION: Severe emphysema without acute disease. Electronically Signed   By: Inge Rise M.D.   On: 05/06/2015 18:40   Dg Chest Port 1 View  05/06/2015  CLINICAL DATA:  Acute onset of shortness of breath, cough and congestion. Initial encounter. EXAM: PORTABLE CHEST 1 VIEW COMPARISON:  Chest radiograph performed 02/19/2015 FINDINGS: The lungs are hyperexpanded, with flattening of the  hemidiaphragms, compatible with COPD. Mild bibasilar opacities raise concern for pneumonia. There is no evidence of pleural effusion or pneumothorax. The cardiomediastinal silhouette is within normal limits. No acute osseous abnormalities are seen. IMPRESSION: 1. Mild bibasilar airspace opacities raise concern  for pneumonia. 2. Findings of COPD. Electronically Signed   By: Garald Balding M.D.   On: 05/06/2015 05:35    2D Echo: n/a  Cardiac Cath: n/a  Admission HPI: [name] is a 65 year old lady with PMH of COPD on continous 3L [place] (FEV1 28% pred, FEV1/FVC 47 per PFTs 08/2012), Osteoperosis, Iron deficiency anemia, depression, and bipolar disorder who presented with complaints of SOB. Patient states she woke at 3 am this morning with worsened SOB and increased wheezing and sputum production compared to her baseline. She reports green sputum, which is not a change in color from baseline. She tried her rescue inhaler without relief. She tried another treatment without any improvement. She lives at [facility] and has a roommate. She reports her ALF being quarantined for recent flu outbreak.  EMS was called and was given IV Solu-medrol and nebulizer treatment on route. In [facility] ED she was started on an hour long continuous nebulizer treatment with improvement in symptoms. CXR was done which showed mild bibasilar opacities. She was given IV Azithromycin and Ceftriaxone due to concern of pneumonia. Patient then transferred to [facility] for overnight observation.  Hospital Course by problem list: Principal Problem:   Acute exacerbation of chronic obstructive pulmonary disease (COPD) ([place]) Active Problems:   Osteoporosis   Bipolar I disorder, most recent episode depressed (HCC)   Insomnia   Iron deficiency anemia   Smoking   Constipation   Acute COPD Exacerbation: Patient with end stage COPD on continuous 3L O2 via [place] at her ALF [facility]) with worsened wheezing and cough from  baseline presented to [facility] ED. She was afebrile without leukocytosis. She was given IV Ceftriaxone and Azithromycin due to concern for CAP. Initial 1 view CXR with questionable B/L opacities so repeat 2 view was obtained without infiltrates to suggest pneumonia.Patient responded well to initial treatments in the [facility] ED. Breathing treatments and oxygen were continued and she was treated with Prednisone 40 mg for 5 days and Levaquin (IV one day -> oral) for 5 days. Breathing-wise, patient improved to her baseline. At her ALF, she uses a walker to ambulate and reports difficulty with her activities of daily living. During admission she reported that her weakness was worse compared to baseline and was concerned she did not have enough assistance at her ALF. PT evaluated and recommended home health PT at ALF as well as wheelchair to travel to/from dining room.  Chronic Pain: Continued home Oxycodone, Gabapentin  Bipolar disorder: Continued home Lamictal  Tobacco use: Smoking 2 cigarettes a day. Patient has no desire to quit, acknowledged this is the cause of her disease, and states that it is her choice to continue.   Discharge Vitals:   BP 120/72 mmHg  Pulse 95  Temp(Src) 98.2 F (36.8 C) (Oral)  Resp 18  Wt 89 lb 15.2 oz (40.8 kg)  SpO2 95%  Discharge Labs:  No results found for this or any previous visit (from the past 24 hour(s)).  Signed: Zada Finders, MD 05/08/2015, 3:33 PM    Services Ordered on Discharge: [place] on Discharge: Wheelchair

## 2015-05-08 NOTE — Progress Notes (Signed)
Internal Medicine Attending  Date: 05/08/2015  Patient name: [name] Medical record number: [id number] Date of birth: [birth year] Age: 64 y.o. Gender: female  I saw and evaluated the patient. I reviewed the resident's note by Dr. Posey Pronto and I agree with the resident's findings and plans as documented in his progress note.  [name] is currently at baseline from a pulmonary standpoint. We had a long talk about her needs and the ALF does not provide the support for ADLs that she requires. She asked that we write her a letter so that she may have her nebulizer machine with her as a rescue. This is reasonable and we will do so. We also reviewed her inhaler technique. Although it was good there was an error in how the inhaler was used, particularly an actuating the inhaler at the same time that she started breathing in. We explained that this resulted in a lot of the mist hitting the back of her throat rather than ending up deep in her lungs. We described appropriate technique and recommended a spacer which we will try to give her prior to discharge. This should improve the delivery of the albuterol as a rescue in the form of an MDI. At this point she is stable to return to the ALF as we attempt to place her in a higher level of care. She insists on smoking and finding a facility that will allow this. She is well aware of the risks and has made a conscious decision to continue to smoke as she enjoys it immensely.

## 2015-05-08 NOTE — Care Management Note (Signed)
Case Management Note  Patient Details  Name: [name] MRN: [id number] Date of Birth: 05/09/1950  Subjective/Objective:                    Action/Plan:  Patient to return to [facility] assisted living . Dian Situ with [facility] aware home health PT ordered. Orders faxed to [facility].   [facility] will deliver wheelchair to patient at [facility] . Patient has NEB machine already .   [facility] requested patient be transported by ambulance .   Patient aware of above and in agreement.   [facility] arranging transport and faxing requirement information to [place]. Expected Discharge Date:   (UNKNOWN)               Expected Discharge Plan:  Assisted Living / Rest Home  In-House Referral:  Clinical Social Work  Discharge planning Services  CM Consult  Post Acute Care Choice:  Home Health Choice offered to:  Patient  DME Arranged:  Programmer, multimedia DME Agency:  [place]:  PT [place]:     Status of Service:  Completed, signed off  Medicare Important Message Given:    Date Medicare IM Given:    Medicare IM give by:    Date Additional Medicare IM Given:    Additional Medicare Important Message give by:     If discussed at [place] of Stay Meetings, dates discussed:    Additional Comments:  Marilu Favre, RN 05/08/2015, 3:36 PM

## 2015-05-08 NOTE — Progress Notes (Signed)
Attempted to call report to [place]. Called several times more than like 5 still no response. Patient in route to facility via [place]. Patient ready for discharge.

## 2015-05-08 NOTE — Progress Notes (Signed)
Facility is ready for patient. Transportation has been arranged via [facility] for patient. Per [facility], it will be between now and 9pm before transfer. No further CSW needs were requested at this time. CSW to sign off.   Lucius Conn, [place] Worker [facility] Ph: [phone]

## 2015-05-08 NOTE — Progress Notes (Signed)
   Subjective: Patient feels her breathing is improved, has continued weakness, difficulty walking. She says she does not receive adequate assistance for her daily activities at her ALF. She requests to have her breathing treatments in her room at [facility] when she returns to her facility. She is also open to moving to a higher level of care, as long as she is allowed to smoke. Objective: Vital signs in last 24 hours: Filed Vitals:   05/07/15 2046 05/07/15 2130 05/08/15 0501 05/08/15 1131  BP:  108/68 98/62   Pulse:  89 99   Temp:  98.2 F (36.8 C) 98.6 F (37 C)   TempSrc:  Oral Oral   Resp:  18 18   Weight:   89 lb 15.2 oz (40.8 kg)   SpO2: 94% 100% 95% 93%   Weight change:   Intake/Output Summary (Last 24 hours) at 05/08/15 1220 Last data filed at 05/08/15 1009  Gross per 24 hour  Intake   1320 ml  Output   2650 ml  Net  -1330 ml   General: thin elderly woman, resting in bed, on 3L [place] Cardiac: RRR, no rubs, murmurs or gallops Pulm: diffuse expiratory wheezing Abd: soft, nontender, nondistended, BS present Ext: thin extremities, no pedal edema   Assessment/Plan: Principal Problem:   Acute exacerbation of chronic obstructive pulmonary disease (COPD) (HCC) Active Problems:   Osteoporosis   Bipolar I disorder, most recent episode depressed (HCC)   Insomnia   Iron deficiency anemia   Smoking   Constipation  Ms. Ellisyn Teubert is a 65 year old lady with PMH of COPD on continous 3L [place] (FEV1 28% pred, FEV1/FVC 47 per PFTs 08/2012), Osteoperosis, Iron deficiency anemia, depression, and bipolar disorder who presented with complaints of SOB.  Acute COPD exacerbation: Patient says her breathing is well as along as she receives her treatment in time (every 4 hours). She has a rescue albuterol inhaler for use as needed at home, but would benefit from having a spacer.  - Levaquin po 750 mg daily for 5 total day course - continue home 3L oxygen supplementation - duonebs q6h -  albuetol nebulizer prn - dulera 2 puffs BID - prednisone 40mg  daily x 5 days - mucinex - PT eval - Provide spacer for home rescue inhaler - At ALF, patient will need to have her nebulizer treatments in her room along with nebulizer machine for use for her COPD  Bipolar: -Continue Lamictal.  Chronic pain: -Continue Cymbalta, gabapentin, oxycodone.  Constipation:  -prn Senokot, Miralax  Tobacco abuse: She has cut back to two cigarettes per day. She knows smoking has led to current disease and says it is her decision to continue smoking twice a day. She     Dispo:   Anticipated discharge this evening or tomorrow, pending PT eval to ambulate short distances with assistance and walker. Will return to [facility] on discharge, but will need higher level care in the future. Will attempt to start process to have this done.      Zada Finders, MD 05/08/2015, 12:20 PM

## 2015-05-09 DIAGNOSIS — J449 Chronic obstructive pulmonary disease, unspecified: Secondary | ICD-10-CM | POA: Diagnosis not present

## 2015-05-12 ENCOUNTER — Telehealth: Payer: Self-pay | Admitting: Internal Medicine

## 2015-05-12 DIAGNOSIS — J449 Chronic obstructive pulmonary disease, unspecified: Secondary | ICD-10-CM | POA: Diagnosis not present

## 2015-05-12 NOTE — Telephone Encounter (Signed)
Pt requesting the nurse to call back. 

## 2015-05-12 NOTE — Telephone Encounter (Signed)
Pt requesting a note from the doctor stating she need breathing treatment in the room. Please call pt back.

## 2015-05-12 NOTE — Telephone Encounter (Signed)
To make things even confusing, Dr. Zada Finders took care of her this past week. I am copying him on this message.

## 2015-05-12 NOTE — Telephone Encounter (Signed)
Pt calls and states dr patel told her he would send the facility an order to leave her albuterol nebs in her room and she could administer them when needed, she states she has had to wait 2 hours one day to get her treatment and needs dr patel to write an order and fax to the facility. Please advise Also [facility] calls and states it will be Thursday before they can do her start of care eval, is this ok w/ you?

## 2015-05-12 NOTE — Telephone Encounter (Signed)
Addressed, sent to dr patel

## 2015-05-13 ENCOUNTER — Encounter: Payer: Self-pay | Admitting: Internal Medicine

## 2015-05-13 NOTE — Telephone Encounter (Signed)
A letter was placed in [name]'s chart on discharge from hospital. I have printed a new letter to send to her facility. Please let me know if there are any further issues.  Thank you, [name]

## 2015-05-14 ENCOUNTER — Telehealth: Payer: Self-pay | Admitting: Internal Medicine

## 2015-05-14 DIAGNOSIS — M1990 Unspecified osteoarthritis, unspecified site: Secondary | ICD-10-CM | POA: Diagnosis not present

## 2015-05-14 DIAGNOSIS — R2681 Unsteadiness on feet: Secondary | ICD-10-CM | POA: Diagnosis not present

## 2015-05-14 DIAGNOSIS — Z79891 Long term (current) use of opiate analgesic: Secondary | ICD-10-CM | POA: Diagnosis not present

## 2015-05-14 DIAGNOSIS — M545 Low back pain: Secondary | ICD-10-CM | POA: Diagnosis not present

## 2015-05-14 DIAGNOSIS — J441 Chronic obstructive pulmonary disease with (acute) exacerbation: Secondary | ICD-10-CM | POA: Diagnosis not present

## 2015-05-14 DIAGNOSIS — G8929 Other chronic pain: Secondary | ICD-10-CM | POA: Diagnosis not present

## 2015-05-14 DIAGNOSIS — M810 Age-related osteoporosis without current pathological fracture: Secondary | ICD-10-CM | POA: Diagnosis not present

## 2015-05-14 DIAGNOSIS — D509 Iron deficiency anemia, unspecified: Secondary | ICD-10-CM | POA: Diagnosis not present

## 2015-05-14 DIAGNOSIS — M25552 Pain in left hip: Secondary | ICD-10-CM | POA: Diagnosis not present

## 2015-05-14 DIAGNOSIS — Z9981 Dependence on supplemental oxygen: Secondary | ICD-10-CM | POA: Diagnosis not present

## 2015-05-14 DIAGNOSIS — G894 Chronic pain syndrome: Secondary | ICD-10-CM | POA: Diagnosis not present

## 2015-05-14 NOTE — Telephone Encounter (Signed)
Elizabeth from [facility] requesting the nurse to call back regarding pt inhaler.

## 2015-05-15 ENCOUNTER — Telehealth: Payer: Self-pay | Admitting: Internal Medicine

## 2015-05-15 NOTE — Telephone Encounter (Signed)
i have called and spoken to [place] at facility, also faxed the letter to her at [phone], ph [phone]

## 2015-05-15 NOTE — Telephone Encounter (Signed)
VO needed for PT:  1week 1 visit 2 weeks 4 times 1 week 2 times

## 2015-05-15 NOTE — Telephone Encounter (Signed)
Pt call back again for nebulizer to be in the room with her. Please call pt back.

## 2015-05-18 ENCOUNTER — Telehealth: Payer: Self-pay | Admitting: Internal Medicine

## 2015-05-18 NOTE — Telephone Encounter (Signed)
Please give verbal order for PT. Thank you

## 2015-05-18 NOTE — Telephone Encounter (Signed)
APPT REMINDER CALL, LMTCB IF SHE NEEDS TO CANCEL °

## 2015-05-19 ENCOUNTER — Encounter: Payer: Self-pay | Admitting: Internal Medicine

## 2015-05-19 DIAGNOSIS — R2681 Unsteadiness on feet: Secondary | ICD-10-CM | POA: Diagnosis not present

## 2015-05-19 DIAGNOSIS — D509 Iron deficiency anemia, unspecified: Secondary | ICD-10-CM | POA: Diagnosis not present

## 2015-05-19 DIAGNOSIS — G894 Chronic pain syndrome: Secondary | ICD-10-CM | POA: Diagnosis not present

## 2015-05-19 DIAGNOSIS — M810 Age-related osteoporosis without current pathological fracture: Secondary | ICD-10-CM | POA: Diagnosis not present

## 2015-05-19 DIAGNOSIS — Z9981 Dependence on supplemental oxygen: Secondary | ICD-10-CM | POA: Diagnosis not present

## 2015-05-19 DIAGNOSIS — J441 Chronic obstructive pulmonary disease with (acute) exacerbation: Secondary | ICD-10-CM | POA: Diagnosis not present

## 2015-05-19 DIAGNOSIS — M1990 Unspecified osteoarthritis, unspecified site: Secondary | ICD-10-CM | POA: Diagnosis not present

## 2015-05-21 ENCOUNTER — Encounter: Payer: Self-pay | Admitting: Internal Medicine

## 2015-05-21 DIAGNOSIS — Z9981 Dependence on supplemental oxygen: Secondary | ICD-10-CM | POA: Diagnosis not present

## 2015-05-21 DIAGNOSIS — M1990 Unspecified osteoarthritis, unspecified site: Secondary | ICD-10-CM | POA: Diagnosis not present

## 2015-05-21 DIAGNOSIS — M79673 Pain in unspecified foot: Secondary | ICD-10-CM | POA: Diagnosis not present

## 2015-05-21 DIAGNOSIS — R2681 Unsteadiness on feet: Secondary | ICD-10-CM | POA: Diagnosis not present

## 2015-05-21 DIAGNOSIS — B070 Plantar wart: Secondary | ICD-10-CM | POA: Diagnosis not present

## 2015-05-21 DIAGNOSIS — D509 Iron deficiency anemia, unspecified: Secondary | ICD-10-CM | POA: Diagnosis not present

## 2015-05-21 DIAGNOSIS — J441 Chronic obstructive pulmonary disease with (acute) exacerbation: Secondary | ICD-10-CM | POA: Diagnosis not present

## 2015-05-21 DIAGNOSIS — B351 Tinea unguium: Secondary | ICD-10-CM | POA: Diagnosis not present

## 2015-05-21 DIAGNOSIS — L851 Acquired keratosis [keratoderma] palmaris et plantaris: Secondary | ICD-10-CM | POA: Diagnosis not present

## 2015-05-21 DIAGNOSIS — G894 Chronic pain syndrome: Secondary | ICD-10-CM | POA: Diagnosis not present

## 2015-05-21 DIAGNOSIS — M810 Age-related osteoporosis without current pathological fracture: Secondary | ICD-10-CM | POA: Diagnosis not present

## 2015-05-25 DIAGNOSIS — R2681 Unsteadiness on feet: Secondary | ICD-10-CM | POA: Diagnosis not present

## 2015-05-25 DIAGNOSIS — M810 Age-related osteoporosis without current pathological fracture: Secondary | ICD-10-CM | POA: Diagnosis not present

## 2015-05-25 DIAGNOSIS — M1990 Unspecified osteoarthritis, unspecified site: Secondary | ICD-10-CM | POA: Diagnosis not present

## 2015-05-25 DIAGNOSIS — J441 Chronic obstructive pulmonary disease with (acute) exacerbation: Secondary | ICD-10-CM | POA: Diagnosis not present

## 2015-05-25 DIAGNOSIS — D509 Iron deficiency anemia, unspecified: Secondary | ICD-10-CM | POA: Diagnosis not present

## 2015-05-25 DIAGNOSIS — G894 Chronic pain syndrome: Secondary | ICD-10-CM | POA: Diagnosis not present

## 2015-05-25 DIAGNOSIS — Z9981 Dependence on supplemental oxygen: Secondary | ICD-10-CM | POA: Diagnosis not present

## 2015-05-26 ENCOUNTER — Other Ambulatory Visit: Payer: Self-pay | Admitting: *Deleted

## 2015-05-26 DIAGNOSIS — G4700 Insomnia, unspecified: Secondary | ICD-10-CM

## 2015-05-26 MED ORDER — MELATONIN 10 MG PO TABS: 1.0000 | ORAL_TABLET | Freq: Every evening | ORAL | Status: DC | PRN

## 2015-05-26 NOTE — Telephone Encounter (Signed)
No showed her HFU. HAs not seen PCP since Sep. Pls sch PCP appt April, May, or June

## 2015-05-28 DIAGNOSIS — Z9981 Dependence on supplemental oxygen: Secondary | ICD-10-CM | POA: Diagnosis not present

## 2015-05-28 DIAGNOSIS — G894 Chronic pain syndrome: Secondary | ICD-10-CM | POA: Diagnosis not present

## 2015-05-28 DIAGNOSIS — R2681 Unsteadiness on feet: Secondary | ICD-10-CM | POA: Diagnosis not present

## 2015-05-28 DIAGNOSIS — M810 Age-related osteoporosis without current pathological fracture: Secondary | ICD-10-CM | POA: Diagnosis not present

## 2015-05-28 DIAGNOSIS — D509 Iron deficiency anemia, unspecified: Secondary | ICD-10-CM | POA: Diagnosis not present

## 2015-05-28 DIAGNOSIS — J441 Chronic obstructive pulmonary disease with (acute) exacerbation: Secondary | ICD-10-CM | POA: Diagnosis not present

## 2015-05-28 DIAGNOSIS — M1990 Unspecified osteoarthritis, unspecified site: Secondary | ICD-10-CM | POA: Diagnosis not present

## 2015-06-01 DIAGNOSIS — D509 Iron deficiency anemia, unspecified: Secondary | ICD-10-CM | POA: Diagnosis not present

## 2015-06-01 DIAGNOSIS — G894 Chronic pain syndrome: Secondary | ICD-10-CM | POA: Diagnosis not present

## 2015-06-01 DIAGNOSIS — R2681 Unsteadiness on feet: Secondary | ICD-10-CM | POA: Diagnosis not present

## 2015-06-01 DIAGNOSIS — M1990 Unspecified osteoarthritis, unspecified site: Secondary | ICD-10-CM | POA: Diagnosis not present

## 2015-06-01 DIAGNOSIS — M810 Age-related osteoporosis without current pathological fracture: Secondary | ICD-10-CM | POA: Diagnosis not present

## 2015-06-01 DIAGNOSIS — J441 Chronic obstructive pulmonary disease with (acute) exacerbation: Secondary | ICD-10-CM | POA: Diagnosis not present

## 2015-06-01 DIAGNOSIS — Z9981 Dependence on supplemental oxygen: Secondary | ICD-10-CM | POA: Diagnosis not present

## 2015-06-03 DIAGNOSIS — J441 Chronic obstructive pulmonary disease with (acute) exacerbation: Secondary | ICD-10-CM | POA: Diagnosis not present

## 2015-06-03 DIAGNOSIS — Z9981 Dependence on supplemental oxygen: Secondary | ICD-10-CM | POA: Diagnosis not present

## 2015-06-03 DIAGNOSIS — D509 Iron deficiency anemia, unspecified: Secondary | ICD-10-CM | POA: Diagnosis not present

## 2015-06-03 DIAGNOSIS — G894 Chronic pain syndrome: Secondary | ICD-10-CM | POA: Diagnosis not present

## 2015-06-03 DIAGNOSIS — M1990 Unspecified osteoarthritis, unspecified site: Secondary | ICD-10-CM | POA: Diagnosis not present

## 2015-06-03 DIAGNOSIS — R2681 Unsteadiness on feet: Secondary | ICD-10-CM | POA: Diagnosis not present

## 2015-06-03 DIAGNOSIS — M810 Age-related osteoporosis without current pathological fracture: Secondary | ICD-10-CM | POA: Diagnosis not present

## 2015-06-05 ENCOUNTER — Other Ambulatory Visit: Payer: Self-pay | Admitting: Internal Medicine

## 2015-06-05 NOTE — Telephone Encounter (Signed)
Called twice today, lm for rtc

## 2015-06-05 NOTE — Telephone Encounter (Signed)
Pt need refill 

## 2015-06-06 DIAGNOSIS — J449 Chronic obstructive pulmonary disease, unspecified: Secondary | ICD-10-CM | POA: Diagnosis not present

## 2015-06-08 NOTE — Telephone Encounter (Signed)
Call made to patient-she is requesting a refill on her oxycodone and gabapentin.  Pt was going to [facility], but requested on 05/05/15 OV to be referrede to a different one-stated "I cant stand that place".  Pt is still awaiting appt with the new office, but will run out of medications this week.  Will send request to pcp for review.  Will also follow-up with referral coordinator to check on referral. Pt is currently residing at [facility] and has an upcoming appt on 07/21/15 with pcp..Please advise.Despina Hidden Cassady3/27/20179:26 AM    Last office visit: 05/05/15 Last UDS: ? Last Refill: 05/16/2015 #90 (30-day supply) Next appt: 07/21/15 w/ PCP

## 2015-06-08 NOTE — Telephone Encounter (Signed)
Patient will need to be seen in her current pain clinic till she gets an appointment at her new one. Per her FYI she has broken her pain contract with a pain clinic before and also has a history of narcotic overdose. I would not feel comfortable prescribing this from our clinic. She would need an appointment to be seen to discuss this.

## 2015-06-08 NOTE — Telephone Encounter (Signed)
Pt aware.Jordan Hidden Cassady3/27/20174:48 PM

## 2015-06-09 ENCOUNTER — Telehealth: Payer: Self-pay | Admitting: Licensed Clinical Social Worker

## 2015-06-09 NOTE — Telephone Encounter (Signed)
CSW received call from [name].  Pt states she is currently at [facility], on [address].  [name] voiced concern that she has difficulty obtaining assistance and would like a listing of facilities that provide more assistance (nursing home) and allow smoking.  Pt notified CSW will mail listing.  CSW encouraged [name] to inquire if facility has Production assistant, radio and provided pt with the phone number to the [facility] for additional advocates.  CSW discussed if patient would be interested in [facility] assessment.  Pt agreeable as PCS would provide add'l services needed.  Letter mailed and PCS form initiated.

## 2015-06-10 ENCOUNTER — Other Ambulatory Visit: Payer: Self-pay | Admitting: Internal Medicine

## 2015-06-10 DIAGNOSIS — R2681 Unsteadiness on feet: Secondary | ICD-10-CM | POA: Diagnosis not present

## 2015-06-10 DIAGNOSIS — G894 Chronic pain syndrome: Secondary | ICD-10-CM | POA: Diagnosis not present

## 2015-06-10 DIAGNOSIS — J441 Chronic obstructive pulmonary disease with (acute) exacerbation: Secondary | ICD-10-CM | POA: Diagnosis not present

## 2015-06-10 DIAGNOSIS — D509 Iron deficiency anemia, unspecified: Secondary | ICD-10-CM | POA: Diagnosis not present

## 2015-06-10 DIAGNOSIS — Z1231 Encounter for screening mammogram for malignant neoplasm of breast: Secondary | ICD-10-CM

## 2015-06-10 DIAGNOSIS — M810 Age-related osteoporosis without current pathological fracture: Secondary | ICD-10-CM | POA: Diagnosis not present

## 2015-06-10 DIAGNOSIS — Z9981 Dependence on supplemental oxygen: Secondary | ICD-10-CM | POA: Diagnosis not present

## 2015-06-10 DIAGNOSIS — M1990 Unspecified osteoarthritis, unspecified site: Secondary | ICD-10-CM | POA: Diagnosis not present

## 2015-06-11 DIAGNOSIS — G894 Chronic pain syndrome: Secondary | ICD-10-CM | POA: Diagnosis not present

## 2015-06-11 DIAGNOSIS — M545 Low back pain: Secondary | ICD-10-CM | POA: Diagnosis not present

## 2015-06-11 DIAGNOSIS — M25552 Pain in left hip: Secondary | ICD-10-CM | POA: Diagnosis not present

## 2015-06-11 DIAGNOSIS — G890 Central pain syndrome: Secondary | ICD-10-CM | POA: Diagnosis not present

## 2015-06-12 DIAGNOSIS — J441 Chronic obstructive pulmonary disease with (acute) exacerbation: Secondary | ICD-10-CM | POA: Diagnosis not present

## 2015-06-12 DIAGNOSIS — D509 Iron deficiency anemia, unspecified: Secondary | ICD-10-CM | POA: Diagnosis not present

## 2015-06-12 DIAGNOSIS — G894 Chronic pain syndrome: Secondary | ICD-10-CM | POA: Diagnosis not present

## 2015-06-12 DIAGNOSIS — Z9981 Dependence on supplemental oxygen: Secondary | ICD-10-CM | POA: Diagnosis not present

## 2015-06-12 DIAGNOSIS — J449 Chronic obstructive pulmonary disease, unspecified: Secondary | ICD-10-CM | POA: Diagnosis not present

## 2015-06-12 DIAGNOSIS — M1990 Unspecified osteoarthritis, unspecified site: Secondary | ICD-10-CM | POA: Diagnosis not present

## 2015-06-12 DIAGNOSIS — M810 Age-related osteoporosis without current pathological fracture: Secondary | ICD-10-CM | POA: Diagnosis not present

## 2015-06-12 DIAGNOSIS — R2681 Unsteadiness on feet: Secondary | ICD-10-CM | POA: Diagnosis not present

## 2015-06-17 DIAGNOSIS — D509 Iron deficiency anemia, unspecified: Secondary | ICD-10-CM | POA: Diagnosis not present

## 2015-06-17 DIAGNOSIS — J441 Chronic obstructive pulmonary disease with (acute) exacerbation: Secondary | ICD-10-CM | POA: Diagnosis not present

## 2015-06-17 DIAGNOSIS — Z9981 Dependence on supplemental oxygen: Secondary | ICD-10-CM | POA: Diagnosis not present

## 2015-06-17 DIAGNOSIS — R2681 Unsteadiness on feet: Secondary | ICD-10-CM | POA: Diagnosis not present

## 2015-06-17 DIAGNOSIS — G894 Chronic pain syndrome: Secondary | ICD-10-CM | POA: Diagnosis not present

## 2015-06-17 DIAGNOSIS — M1990 Unspecified osteoarthritis, unspecified site: Secondary | ICD-10-CM | POA: Diagnosis not present

## 2015-06-17 DIAGNOSIS — M810 Age-related osteoporosis without current pathological fracture: Secondary | ICD-10-CM | POA: Diagnosis not present

## 2015-06-19 ENCOUNTER — Telehealth: Payer: Self-pay | Admitting: Internal Medicine

## 2015-06-19 NOTE — Telephone Encounter (Signed)
Patient called to state she will not be going to see Dr. Brandy Hale at this office because [facility] where she resides only transports patients  on Tuesdays and Thursdays only. This Pain clinic is only open on Wednesdays.  Patient states that  "This is her choice that she is making to stay with [facility] and she has called them and made another appointment on 07/07/2015 to see them instead."

## 2015-06-20 ENCOUNTER — Encounter: Payer: Self-pay | Admitting: Internal Medicine

## 2015-06-20 NOTE — Progress Notes (Signed)
Patient ID: [name], female   DOB: 1950/12/10, 65 y.o.   MRN: [id number] I am completing paperwork today from [facility] that requires my signature:  Received 06/19/15: Indication that the patient has had another 3 pound weight loss. She was placed on the at risk nutrition program with weekly monitoring of weights and recording of food intake 3 times a week-both for the next 4 weeks.  I will attempt to give her a call in the next week to check up on what may be driving her decreased appetite.

## 2015-06-25 DIAGNOSIS — M1990 Unspecified osteoarthritis, unspecified site: Secondary | ICD-10-CM | POA: Diagnosis not present

## 2015-06-25 DIAGNOSIS — G894 Chronic pain syndrome: Secondary | ICD-10-CM | POA: Diagnosis not present

## 2015-06-25 DIAGNOSIS — R2681 Unsteadiness on feet: Secondary | ICD-10-CM | POA: Diagnosis not present

## 2015-06-25 DIAGNOSIS — D509 Iron deficiency anemia, unspecified: Secondary | ICD-10-CM | POA: Diagnosis not present

## 2015-06-25 DIAGNOSIS — Z9981 Dependence on supplemental oxygen: Secondary | ICD-10-CM | POA: Diagnosis not present

## 2015-06-25 DIAGNOSIS — M810 Age-related osteoporosis without current pathological fracture: Secondary | ICD-10-CM | POA: Diagnosis not present

## 2015-06-25 DIAGNOSIS — J441 Chronic obstructive pulmonary disease with (acute) exacerbation: Secondary | ICD-10-CM | POA: Diagnosis not present

## 2015-06-29 ENCOUNTER — Other Ambulatory Visit: Payer: Self-pay | Admitting: Internal Medicine

## 2015-06-29 MED ORDER — DESLORATADINE 5 MG PO TABS: 5.0000 mg | ORAL_TABLET | Freq: Every day | ORAL | Status: DC

## 2015-06-29 NOTE — Telephone Encounter (Signed)
As this patient was seen by me, I will refill this prescription for desloratidine

## 2015-06-29 NOTE — Telephone Encounter (Signed)
Needs refill desloratadine (CLARINEX) 5 MG tablet [facility]

## 2015-06-30 ENCOUNTER — Telehealth: Payer: Self-pay | Admitting: Licensed Clinical Social Worker

## 2015-07-01 DIAGNOSIS — D509 Iron deficiency anemia, unspecified: Secondary | ICD-10-CM | POA: Diagnosis not present

## 2015-07-01 DIAGNOSIS — M810 Age-related osteoporosis without current pathological fracture: Secondary | ICD-10-CM | POA: Diagnosis not present

## 2015-07-01 DIAGNOSIS — R2681 Unsteadiness on feet: Secondary | ICD-10-CM | POA: Diagnosis not present

## 2015-07-01 DIAGNOSIS — J441 Chronic obstructive pulmonary disease with (acute) exacerbation: Secondary | ICD-10-CM | POA: Diagnosis not present

## 2015-07-01 DIAGNOSIS — G894 Chronic pain syndrome: Secondary | ICD-10-CM | POA: Diagnosis not present

## 2015-07-01 DIAGNOSIS — M1990 Unspecified osteoarthritis, unspecified site: Secondary | ICD-10-CM | POA: Diagnosis not present

## 2015-07-01 DIAGNOSIS — Z9981 Dependence on supplemental oxygen: Secondary | ICD-10-CM | POA: Diagnosis not present

## 2015-07-01 NOTE — Telephone Encounter (Signed)
CSW returned call to pt.  CSW left message requesting return call. CSW provided contact hours and phone number. 

## 2015-07-02 NOTE — Telephone Encounter (Signed)
CSW received return voicemail from [name].  Pt states she will be out from 11:45 - 12:45 today and requesting return call outside of those hours.

## 2015-07-02 NOTE — Telephone Encounter (Signed)
CSW placed call to [name].  Pt states she received listing of NF from [facility] and is requesting an FL-2 to be sent to [facility] for nursing facility placement.  Pt is currently in [facility] and states she requires add'l assistance than what her current assisted living provides.  Pt aware her next appointment with PCP is 07/21/15.  This worker will send note to PCP if FL-2 to be completed prior to or at appointment.

## 2015-07-03 NOTE — Telephone Encounter (Signed)
Let's go ahead and start the process now. Thank you for asking.

## 2015-07-03 NOTE — Telephone Encounter (Signed)
Thank you.  It will be in your mailbox.

## 2015-07-06 DIAGNOSIS — Z79899 Other long term (current) drug therapy: Secondary | ICD-10-CM | POA: Diagnosis not present

## 2015-07-07 DIAGNOSIS — J449 Chronic obstructive pulmonary disease, unspecified: Secondary | ICD-10-CM | POA: Diagnosis not present

## 2015-07-07 NOTE — Telephone Encounter (Signed)
In completing this form, I realize that some her medications have not been reconciled with what is listed in the system. Is there any way we can get the list of her current medications from her current facility? I do not see any controlled substances she has filled in the last several months but know that she has been on oxycodone in the past. I do not want to put her in harm with inaccurate documentation.

## 2015-07-08 DIAGNOSIS — E873 Alkalosis: Secondary | ICD-10-CM | POA: Diagnosis not present

## 2015-07-08 DIAGNOSIS — H6123 Impacted cerumen, bilateral: Secondary | ICD-10-CM | POA: Diagnosis not present

## 2015-07-08 DIAGNOSIS — J449 Chronic obstructive pulmonary disease, unspecified: Secondary | ICD-10-CM | POA: Diagnosis not present

## 2015-07-08 NOTE — Telephone Encounter (Signed)
Please check with the front office to obtain records from [facility].

## 2015-07-09 DIAGNOSIS — M25552 Pain in left hip: Secondary | ICD-10-CM | POA: Diagnosis not present

## 2015-07-09 DIAGNOSIS — G8929 Other chronic pain: Secondary | ICD-10-CM | POA: Diagnosis not present

## 2015-07-09 DIAGNOSIS — Z79891 Long term (current) use of opiate analgesic: Secondary | ICD-10-CM | POA: Diagnosis not present

## 2015-07-09 DIAGNOSIS — G894 Chronic pain syndrome: Secondary | ICD-10-CM | POA: Diagnosis not present

## 2015-07-09 DIAGNOSIS — M545 Low back pain: Secondary | ICD-10-CM | POA: Diagnosis not present

## 2015-07-12 DIAGNOSIS — J449 Chronic obstructive pulmonary disease, unspecified: Secondary | ICD-10-CM | POA: Diagnosis not present

## 2015-07-15 DIAGNOSIS — J309 Allergic rhinitis, unspecified: Secondary | ICD-10-CM | POA: Diagnosis not present

## 2015-07-15 DIAGNOSIS — G8929 Other chronic pain: Secondary | ICD-10-CM | POA: Diagnosis not present

## 2015-07-15 DIAGNOSIS — R030 Elevated blood-pressure reading, without diagnosis of hypertension: Secondary | ICD-10-CM | POA: Diagnosis not present

## 2015-07-15 NOTE — Telephone Encounter (Signed)
Have we made any headway in procuring her medication records from [facility]? I would like to complete her FL2 accordingly. Thank you.

## 2015-07-20 ENCOUNTER — Telehealth: Payer: Self-pay | Admitting: Internal Medicine

## 2015-07-20 NOTE — Telephone Encounter (Signed)
APT. REMINDER CALL, LMTCB °

## 2015-07-21 ENCOUNTER — Encounter: Payer: Self-pay | Admitting: Internal Medicine

## 2015-07-21 ENCOUNTER — Ambulatory Visit: Payer: Self-pay | Admitting: Internal Medicine

## 2015-07-23 DIAGNOSIS — B070 Plantar wart: Secondary | ICD-10-CM | POA: Diagnosis not present

## 2015-07-23 DIAGNOSIS — L851 Acquired keratosis [keratoderma] palmaris et plantaris: Secondary | ICD-10-CM | POA: Diagnosis not present

## 2015-07-23 DIAGNOSIS — M79673 Pain in unspecified foot: Secondary | ICD-10-CM | POA: Diagnosis not present

## 2015-07-23 DIAGNOSIS — B351 Tinea unguium: Secondary | ICD-10-CM | POA: Diagnosis not present

## 2015-08-05 DIAGNOSIS — G4700 Insomnia, unspecified: Secondary | ICD-10-CM | POA: Diagnosis not present

## 2015-08-05 DIAGNOSIS — J309 Allergic rhinitis, unspecified: Secondary | ICD-10-CM | POA: Diagnosis not present

## 2015-08-05 DIAGNOSIS — G629 Polyneuropathy, unspecified: Secondary | ICD-10-CM | POA: Diagnosis not present

## 2015-08-05 DIAGNOSIS — Z79891 Long term (current) use of opiate analgesic: Secondary | ICD-10-CM | POA: Diagnosis not present

## 2015-08-05 DIAGNOSIS — M1990 Unspecified osteoarthritis, unspecified site: Secondary | ICD-10-CM | POA: Diagnosis not present

## 2015-08-06 DIAGNOSIS — Z79891 Long term (current) use of opiate analgesic: Secondary | ICD-10-CM | POA: Diagnosis not present

## 2015-08-06 DIAGNOSIS — J449 Chronic obstructive pulmonary disease, unspecified: Secondary | ICD-10-CM | POA: Diagnosis not present

## 2015-08-06 DIAGNOSIS — M25552 Pain in left hip: Secondary | ICD-10-CM | POA: Diagnosis not present

## 2015-08-06 DIAGNOSIS — M25551 Pain in right hip: Secondary | ICD-10-CM | POA: Diagnosis not present

## 2015-08-06 DIAGNOSIS — M79604 Pain in right leg: Secondary | ICD-10-CM | POA: Diagnosis not present

## 2015-08-06 DIAGNOSIS — M79641 Pain in right hand: Secondary | ICD-10-CM | POA: Diagnosis not present

## 2015-08-06 DIAGNOSIS — M5416 Radiculopathy, lumbar region: Secondary | ICD-10-CM | POA: Diagnosis not present

## 2015-08-12 DIAGNOSIS — J449 Chronic obstructive pulmonary disease, unspecified: Secondary | ICD-10-CM | POA: Diagnosis not present

## 2015-08-12 DIAGNOSIS — M1990 Unspecified osteoarthritis, unspecified site: Secondary | ICD-10-CM | POA: Diagnosis not present

## 2015-08-12 DIAGNOSIS — J309 Allergic rhinitis, unspecified: Secondary | ICD-10-CM | POA: Diagnosis not present

## 2015-08-12 DIAGNOSIS — R269 Unspecified abnormalities of gait and mobility: Secondary | ICD-10-CM | POA: Diagnosis not present

## 2015-08-12 DIAGNOSIS — Z79899 Other long term (current) drug therapy: Secondary | ICD-10-CM | POA: Diagnosis not present

## 2015-08-19 DIAGNOSIS — M1990 Unspecified osteoarthritis, unspecified site: Secondary | ICD-10-CM | POA: Diagnosis not present

## 2015-08-19 DIAGNOSIS — J449 Chronic obstructive pulmonary disease, unspecified: Secondary | ICD-10-CM | POA: Diagnosis not present

## 2015-09-01 DIAGNOSIS — M25511 Pain in right shoulder: Secondary | ICD-10-CM | POA: Diagnosis not present

## 2015-09-01 DIAGNOSIS — G894 Chronic pain syndrome: Secondary | ICD-10-CM | POA: Diagnosis not present

## 2015-09-01 DIAGNOSIS — M545 Low back pain: Secondary | ICD-10-CM | POA: Diagnosis not present

## 2015-09-01 DIAGNOSIS — Z79891 Long term (current) use of opiate analgesic: Secondary | ICD-10-CM | POA: Diagnosis not present

## 2015-09-01 DIAGNOSIS — M25552 Pain in left hip: Secondary | ICD-10-CM | POA: Diagnosis not present

## 2015-09-01 DIAGNOSIS — M79641 Pain in right hand: Secondary | ICD-10-CM | POA: Diagnosis not present

## 2015-09-02 DIAGNOSIS — M1990 Unspecified osteoarthritis, unspecified site: Secondary | ICD-10-CM | POA: Diagnosis not present

## 2015-09-02 DIAGNOSIS — Z79899 Other long term (current) drug therapy: Secondary | ICD-10-CM | POA: Diagnosis not present

## 2015-09-02 DIAGNOSIS — G629 Polyneuropathy, unspecified: Secondary | ICD-10-CM | POA: Diagnosis not present

## 2015-09-06 DIAGNOSIS — J449 Chronic obstructive pulmonary disease, unspecified: Secondary | ICD-10-CM | POA: Diagnosis not present

## 2015-09-10 DIAGNOSIS — M79641 Pain in right hand: Secondary | ICD-10-CM | POA: Diagnosis not present

## 2015-09-10 DIAGNOSIS — M25511 Pain in right shoulder: Secondary | ICD-10-CM | POA: Diagnosis not present

## 2015-09-10 DIAGNOSIS — Z79891 Long term (current) use of opiate analgesic: Secondary | ICD-10-CM | POA: Diagnosis not present

## 2015-09-10 DIAGNOSIS — M25552 Pain in left hip: Secondary | ICD-10-CM | POA: Diagnosis not present

## 2015-09-10 DIAGNOSIS — M545 Low back pain: Secondary | ICD-10-CM | POA: Diagnosis not present

## 2015-09-10 DIAGNOSIS — G894 Chronic pain syndrome: Secondary | ICD-10-CM | POA: Diagnosis not present

## 2015-09-11 DIAGNOSIS — J449 Chronic obstructive pulmonary disease, unspecified: Secondary | ICD-10-CM | POA: Diagnosis not present

## 2015-09-23 DIAGNOSIS — G894 Chronic pain syndrome: Secondary | ICD-10-CM | POA: Diagnosis not present

## 2015-09-23 DIAGNOSIS — K219 Gastro-esophageal reflux disease without esophagitis: Secondary | ICD-10-CM | POA: Diagnosis not present

## 2015-09-23 DIAGNOSIS — R030 Elevated blood-pressure reading, without diagnosis of hypertension: Secondary | ICD-10-CM | POA: Diagnosis not present

## 2015-09-23 DIAGNOSIS — K259 Gastric ulcer, unspecified as acute or chronic, without hemorrhage or perforation: Secondary | ICD-10-CM | POA: Diagnosis not present

## 2015-09-30 DIAGNOSIS — J449 Chronic obstructive pulmonary disease, unspecified: Secondary | ICD-10-CM | POA: Diagnosis not present

## 2015-09-30 DIAGNOSIS — J309 Allergic rhinitis, unspecified: Secondary | ICD-10-CM | POA: Diagnosis not present

## 2015-09-30 DIAGNOSIS — M79673 Pain in unspecified foot: Secondary | ICD-10-CM | POA: Diagnosis not present

## 2015-09-30 DIAGNOSIS — L853 Xerosis cutis: Secondary | ICD-10-CM | POA: Diagnosis not present

## 2015-09-30 DIAGNOSIS — R030 Elevated blood-pressure reading, without diagnosis of hypertension: Secondary | ICD-10-CM | POA: Diagnosis not present

## 2015-10-06 DIAGNOSIS — J449 Chronic obstructive pulmonary disease, unspecified: Secondary | ICD-10-CM | POA: Diagnosis not present

## 2015-10-08 DIAGNOSIS — Z79891 Long term (current) use of opiate analgesic: Secondary | ICD-10-CM | POA: Diagnosis not present

## 2015-10-08 DIAGNOSIS — M25551 Pain in right hip: Secondary | ICD-10-CM | POA: Diagnosis not present

## 2015-10-08 DIAGNOSIS — M79604 Pain in right leg: Secondary | ICD-10-CM | POA: Diagnosis not present

## 2015-10-08 DIAGNOSIS — M5416 Radiculopathy, lumbar region: Secondary | ICD-10-CM | POA: Diagnosis not present

## 2015-10-08 DIAGNOSIS — M79641 Pain in right hand: Secondary | ICD-10-CM | POA: Diagnosis not present

## 2015-10-08 DIAGNOSIS — G894 Chronic pain syndrome: Secondary | ICD-10-CM | POA: Diagnosis not present

## 2015-10-12 DIAGNOSIS — J449 Chronic obstructive pulmonary disease, unspecified: Secondary | ICD-10-CM | POA: Diagnosis not present

## 2015-10-14 DIAGNOSIS — R938 Abnormal findings on diagnostic imaging of other specified body structures: Secondary | ICD-10-CM | POA: Diagnosis not present

## 2015-10-14 DIAGNOSIS — J449 Chronic obstructive pulmonary disease, unspecified: Secondary | ICD-10-CM | POA: Diagnosis not present

## 2015-10-14 DIAGNOSIS — M1990 Unspecified osteoarthritis, unspecified site: Secondary | ICD-10-CM | POA: Diagnosis not present

## 2015-10-14 DIAGNOSIS — J309 Allergic rhinitis, unspecified: Secondary | ICD-10-CM | POA: Diagnosis not present

## 2015-10-22 DIAGNOSIS — J449 Chronic obstructive pulmonary disease, unspecified: Secondary | ICD-10-CM | POA: Diagnosis not present

## 2015-10-22 DIAGNOSIS — M545 Low back pain: Secondary | ICD-10-CM | POA: Diagnosis not present

## 2015-10-22 DIAGNOSIS — M818 Other osteoporosis without current pathological fracture: Secondary | ICD-10-CM | POA: Diagnosis not present

## 2015-10-22 DIAGNOSIS — Z993 Dependence on wheelchair: Secondary | ICD-10-CM | POA: Diagnosis not present

## 2015-10-28 DIAGNOSIS — J449 Chronic obstructive pulmonary disease, unspecified: Secondary | ICD-10-CM | POA: Diagnosis not present

## 2015-10-28 DIAGNOSIS — J309 Allergic rhinitis, unspecified: Secondary | ICD-10-CM | POA: Diagnosis not present

## 2015-10-28 DIAGNOSIS — G4700 Insomnia, unspecified: Secondary | ICD-10-CM | POA: Diagnosis not present

## 2015-10-28 DIAGNOSIS — Z79899 Other long term (current) drug therapy: Secondary | ICD-10-CM | POA: Diagnosis not present

## 2015-10-28 DIAGNOSIS — R030 Elevated blood-pressure reading, without diagnosis of hypertension: Secondary | ICD-10-CM | POA: Diagnosis not present

## 2015-11-05 DIAGNOSIS — G894 Chronic pain syndrome: Secondary | ICD-10-CM | POA: Diagnosis not present

## 2015-11-05 DIAGNOSIS — M5416 Radiculopathy, lumbar region: Secondary | ICD-10-CM | POA: Diagnosis not present

## 2015-11-05 DIAGNOSIS — M25551 Pain in right hip: Secondary | ICD-10-CM | POA: Diagnosis not present

## 2015-11-05 DIAGNOSIS — M79641 Pain in right hand: Secondary | ICD-10-CM | POA: Diagnosis not present

## 2015-11-05 DIAGNOSIS — M79604 Pain in right leg: Secondary | ICD-10-CM | POA: Diagnosis not present

## 2015-11-05 DIAGNOSIS — Z79891 Long term (current) use of opiate analgesic: Secondary | ICD-10-CM | POA: Diagnosis not present

## 2015-11-06 DIAGNOSIS — J449 Chronic obstructive pulmonary disease, unspecified: Secondary | ICD-10-CM | POA: Diagnosis not present

## 2015-11-09 DIAGNOSIS — Z79899 Other long term (current) drug therapy: Secondary | ICD-10-CM | POA: Diagnosis not present

## 2015-11-11 DIAGNOSIS — D473 Essential (hemorrhagic) thrombocythemia: Secondary | ICD-10-CM | POA: Diagnosis not present

## 2015-11-11 DIAGNOSIS — J449 Chronic obstructive pulmonary disease, unspecified: Secondary | ICD-10-CM | POA: Diagnosis not present

## 2015-11-11 DIAGNOSIS — E873 Alkalosis: Secondary | ICD-10-CM | POA: Diagnosis not present

## 2015-11-11 DIAGNOSIS — R2689 Other abnormalities of gait and mobility: Secondary | ICD-10-CM | POA: Diagnosis not present

## 2015-11-11 DIAGNOSIS — D72828 Other elevated white blood cell count: Secondary | ICD-10-CM | POA: Diagnosis not present

## 2015-11-12 DIAGNOSIS — J449 Chronic obstructive pulmonary disease, unspecified: Secondary | ICD-10-CM | POA: Diagnosis not present

## 2015-11-24 ENCOUNTER — Other Ambulatory Visit: Payer: Self-pay | Admitting: Internal Medicine

## 2015-11-24 DIAGNOSIS — M6281 Muscle weakness (generalized): Secondary | ICD-10-CM

## 2015-12-07 ENCOUNTER — Encounter (HOSPITAL_COMMUNITY): Payer: Self-pay

## 2015-12-07 ENCOUNTER — Other Ambulatory Visit: Payer: Self-pay | Admitting: Internal Medicine

## 2015-12-07 ENCOUNTER — Emergency Department (HOSPITAL_COMMUNITY)
Admission: EM | Admit: 2015-12-07 | Discharge: 2015-12-07 | Disposition: A | Discharge status: Home / Self Care | Payer: Medicare Other | Attending: Emergency Medicine | Admitting: Emergency Medicine

## 2015-12-07 DIAGNOSIS — J45901 Unspecified asthma with (acute) exacerbation: Secondary | ICD-10-CM | POA: Insufficient documentation

## 2015-12-07 DIAGNOSIS — F17210 Nicotine dependence, cigarettes, uncomplicated: Secondary | ICD-10-CM | POA: Diagnosis not present

## 2015-12-07 DIAGNOSIS — R0602 Shortness of breath: Secondary | ICD-10-CM | POA: Diagnosis present

## 2015-12-07 DIAGNOSIS — J441 Chronic obstructive pulmonary disease with (acute) exacerbation: Secondary | ICD-10-CM

## 2015-12-07 MED ORDER — HYDROCODONE-ACETAMINOPHEN 5-325 MG PO TABS
1.0000 | ORAL_TABLET | Freq: Once | ORAL | Status: AC
  Administered 2015-12-07: 1 via ORAL
  Filled 2015-12-07: qty 1

## 2015-12-07 MED ORDER — IPRATROPIUM BROMIDE 0.02 % IN SOLN
0.5000 mg | Freq: Once | RESPIRATORY_TRACT | Status: AC
  Administered 2015-12-07: 0.5 mg via RESPIRATORY_TRACT
  Filled 2015-12-07: qty 2.5

## 2015-12-07 MED ORDER — PREDNISONE 20 MG PO TABS: 40.0000 mg | ORAL_TABLET | Freq: Every day | ORAL | 0 refills | Status: DC

## 2015-12-07 MED ORDER — ALBUTEROL SULFATE (2.5 MG/3ML) 0.083% IN NEBU
5.0000 mg | INHALATION_SOLUTION | Freq: Once | RESPIRATORY_TRACT | Status: AC
  Administered 2015-12-07: 5 mg via RESPIRATORY_TRACT
  Filled 2015-12-07: qty 6

## 2015-12-07 NOTE — Discharge Planning (Signed)
EDCM reviewed discharging chart for possible CM needs.  No needs identified.    

## 2015-12-07 NOTE — ED Notes (Signed)
Patient was given a cup of coffee, with cream.

## 2015-12-07 NOTE — ED Provider Notes (Signed)
[facility] DEPT Provider Note   CSN: [id number] Arrival date & time: 12/07/15  [id number]     History   Chief Complaint Chief Complaint  Patient presents with  . Shortness of Breath    HPI [name] is a 65 y.o. female.  She does a 65 year old female with history of COPD, current smoker, on 3 L home O2 via [place], she presents emergency room with acute shortness of breath and wheeze and central chest tightness which began this morning at 9 AM. EMS was contacted and she received 2 DuoNeb and 125 mg of Solu-Medrol en route to the ER, with significant improvement of her SOB.  The tendon exam she denies chest pain, shortness of breath, chest tightness. She states that her cough and sputum production is at her baseline. She denies fever, chills, sweats, lower extremity edema, palpitations, orthopnea, PND. She states that during the winter months she has more frequent bronchitis, and has to use steroids or frequently. During the summer months she has more allergies. She states she continues to smoke, and this very active with her home oxygen in nursing home, because walking daily without exertional dyspnea.  She has no other acute complaints or associated symptoms.   The history is provided by the patient.  Shortness of Breath  This is a recurrent problem. The problem occurs intermittently.The current episode started 1 to 2 hours ago. The problem has been resolved. Associated symptoms include cough (baseline cough), sputum production (baseline productive sputum) and wheezing. Pertinent negatives include no fever, no rhinorrhea, no sore throat, no hemoptysis, no PND, no orthopnea, no chest pain, no syncope, no vomiting, no abdominal pain, no rash, no leg pain and no leg swelling. The problem's precipitants include weather/humidity. She has tried ipratropium inhalers, beta-agonist inhalers and inhaled steroids for the symptoms. The treatment provided significant relief. Prior hospitalizations: pt  denies recent admission. She has had prior ED visits. Associated medical issues include COPD.    Past Medical History:  Diagnosis Date  . Anemia   . Anxiety   . Arthritis    "all in my body, primarily left hip" (11/11/2014)  . Bipolar disorder ([place])   . Chronic lower back pain   . Chronic pain syndrome    follows at [facility]  . COPD (chronic obstructive pulmonary disease) (HCC)    oxygen dependent (3L home continuous)  . DEFICIENCY, VITAMIN D NOS 01/03/2007  . Depression   . Duodenal ulcer, chronic   . Elevated liver function tests   . History of blood transfusion 03/2014; 10/2014   "low HgB" (11/11/2014)  . Hyperthyroidism    "borderline"  . On home oxygen therapy    "3L; 24/7" (8/30//2016)  . OSTEOPOROSIS 06/17/2009   DEXA 05/2009 : L femur -2.9; R femur -2.5. Alendronate on med list but not taking. Needs addressed ASAP as h/o fractures.    . Pneumonia X 1?  . Sciatic pain   . Shortness of breath    "all the time" (02/27/2013)  . Substance abuse     narcotics, alcohol, tobacco  . Suicidal ideation 2007    attempted overdose 2012  Dr. Tivis Ringer report    Patient Active Problem List   Diagnosis Date Noted  . Acute exacerbation of chronic obstructive pulmonary disease (COPD) ([place]) 05/06/2015  . COPD exacerbation ([place]) 01/22/2015  . Constipation   . Orthostatic hypotension 11/11/2014  . Duodenal ulcer   . Polypharmacy 10/24/2014  . Smoking 10/24/2014  . Trochanteric bursitis of left hip 10/01/2014  .  Rash and nonspecific skin eruption 09/17/2014  . Left hip pain 09/17/2014  . Immunization, tetanus-diphtheria 09/05/2014  . Iron deficiency anemia 11/12/2013  . Health care maintenance 05/14/2013  . Nausea and vomiting 04/17/2013  . Insomnia 03/06/2013  . GERD (gastroesophageal reflux disease) 03/06/2013  . Protein-calorie malnutrition, severe ([place]) 03/04/2013  . Chronic pain 03/02/2013  . Dyslipidemia 02/26/2013  . Seasonal allergies 07/09/2012  . 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 Osteoporosis 06/17/2009  . Bipolar I disorder, most recent episode depressed ([place]) 06/02/2009  . Vitamin D deficiency 01/03/2007  . BACK PAIN, LUMBAR 12/07/2005    Past Surgical History:  Procedure Laterality Date  . ANKLE DEBRIDEMENT Left 10/2010  . AUGMENTATION MAMMAPLASTY  ~ 2007  . CESAREAN SECTION  1974; 1979  . ESOPHAGOGASTRODUODENOSCOPY N/A 11/07/2014   Procedure: ESOPHAGOGASTRODUODENOSCOPY (EGD);  Surgeon: Irene Shipper, MD;  Location: [facility] ENDOSCOPY;  Service: Endoscopy;  Laterality: N/A;  . FRACTURE SURGERY    . ORIF ANKLE FRACTURE Left 10/2010  . TUBAL LIGATION  1979    OB History    No data available       Home Medications    Prior to Admission medications   Medication Sig Start Date End Date Taking? Authorizing Provider  acetaminophen (TYLENOL) 500 MG tablet Take 1-2 tablets (500-1,000 mg total) by mouth every 12 (twelve) hours as needed (Breakthrough pain). Patient taking differently: Take 500 mg by mouth every 12 (twelve) hours as needed (Breakthrough pain).  12/22/14 12/22/15 Yes Riccardo Dubin, MD  alendronate (FOSAMAX) 70 MG tablet Take 1 tablet (70 mg total) by mouth once a week. Take with a full glass of water on an empty stomach. 10/16/14  Yes Riccardo Dubin, MD  Cholecalciferol 1000 units TBDP Take 1,000 mg by mouth daily.   Yes Historical Provider, MD  ENSURE (ENSURE) Take 237 mLs by mouth 3 (three) times daily between meals.   Yes Historical Provider, MD  Fluticasone-Salmeterol (ADVAIR DISKUS) 250-50 MCG/DOSE AEPB INHALE 1 PUFF INTO THE LUNGS 2 TIMES DAILY 03/19/14  Yes Jones Bales, MD  gabapentin (NEURONTIN) 300 MG capsule Take 300 mg by mouth at bedtime.  06/25/14  Yes Historical Provider, MD  guaiFENesin-dextromethorphan (ROBITUSSIN DM) 100-10 MG/5ML syrup Take 5 mLs by mouth every 4 (four) hours as needed for cough. 02/04/15  Yes Riccardo Dubin, MD  HYDROcodone-acetaminophen  (NORCO/VICODIN) 5-325 MG tablet Take 1 tablet by mouth every 6 (six) hours as needed for moderate pain.   Yes Historical Provider, MD  ipratropium-albuterol (DUONEB) 0.5-2.5 (3) MG/3ML SOLN 4 times a day (breakfast, lunch, dinner, bedtime).Can take 2 additional treatments if needed on bad days. DX J43.8 Patient taking differently: Take 3 mLs by mouth 4 (four) times daily.  07/03/14  Yes Kathee Delton, MD  lamoTRIgine (LAMICTAL) 100 MG tablet TAKE ONE TABLET EVERY DAY 09/23/14  Yes Sid Falcon, MD  levocetirizine (XYZAL) 5 MG tablet Take 5 mg by mouth every evening.   Yes Historical Provider, MD  Melatonin 10 MG TABS Take 1 tablet by mouth at bedtime as needed. 05/26/15  Yes Bartholomew Crews, MD  meloxicam (MOBIC) 7.5 MG tablet Take 7.5 mg by mouth daily.   Yes Historical Provider, MD  mometasone (NASONEX) 50 MCG/ACT nasal spray Place 2 sprays into the nose 2 (two) times daily.   Yes Historical Provider, MD  OLANZapine (ZYPREXA) 2.5 MG tablet Take 2.5 mg by mouth at bedtime.   Yes Historical Provider, MD  pantoprazole ([place])  40 MG tablet Take 1 tablet (40 mg total) by mouth 2 (two) times daily. 11/09/14  Yes Burgess Estelle, MD  predniSONE (DELTASONE) 10 MG tablet Take 10 mg by mouth daily with breakfast.   Yes Historical Provider, MD  PROAIR HFA 108 (90 BASE) MCG/ACT inhaler INHALE ONE PUFF BY MOUTH EVERY 2 TO 4 HOURS AS NEEDED FOR WHEEZING 05/21/14  Yes Riccardo Dubin, MD  venlafaxine (EFFEXOR) 37.5 MG tablet Take 37.5 mg by mouth at bedtime.   Yes Historical Provider, MD  zolpidem (AMBIEN) 5 MG tablet Take 5 mg by mouth at bedtime as needed for sleep.   Yes Historical Provider, MD  desloratadine (CLARINEX) 5 MG tablet Take 1 tablet (5 mg total) by mouth daily. Patient not taking: Reported on 12/07/2015 06/29/15 06/28/16  Riccardo Dubin, MD  DULoxetine (CYMBALTA) 20 MG capsule Take 40 mg by mouth daily.    Historical Provider, MD  ondansetron (ZOFRAN-ODT) 4 MG disintegrating tablet Take 1 tablet (4  mg total) by mouth every 8 (eight) hours as needed for nausea or vomiting. Patient not taking: Reported on 12/07/2015 11/21/14   Ejiroghene Arlyce Dice, MD  Oxycodone HCl 10 MG TABS Take 10 mg by mouth 3 (three) times daily.  10/16/14   Historical Provider, MD  OXYGEN Inhale 3 L into the lungs continuous.    Historical Provider, MD  polyethylene glycol powder (GLYCOLAX/MIRALAX) powder TAKE 17G DISSOLVED IN 8OZ WATER DAILY ASNEEDED FOR MODERATE CONSTIPATION 09/23/14   Sid Falcon, MD  predniSONE (DELTASONE) 20 MG tablet Take 2 tablets (40 mg total) by mouth daily. Take 40 mg by mouth daily for 3 days, then 20mg  by mouth daily for 3 days, then 10mg  daily for 3 days 12/07/15   Delsa Grana, PA-C  senna (SENOKOT) 8.6 MG TABS tablet Take 2 tablets (17.2 mg total) by mouth 3 (three) times daily as needed for mild constipation. 11/13/14   Jule Ser, DO  Spacer/Aero-Holding Chambers (AEROCHAMBER Z-STAT PLUS/MEDIUM) inhaler Use with rescue inhaler Patient not taking: Reported on 12/07/2015 05/08/15   Zada Finders, MD    Family History Family History  Problem Relation Age of Onset  . Myelodysplastic syndrome Father     Died from myelofibrosis though diagnosed post-mortem  . Stroke Neg Hx   . Cancer Neg Hx     Social History Social History  Substance Use Topics  . Smoking status: Current Every Day Smoker    Packs/day: 0.20    Years: 45.00    Types: Cigarettes  . Smokeless tobacco: Never Used     Comment: 11/11/2014 ""smoking 1 cigarettes/day; that's down from 2 ppd"  . Alcohol use 0.0 oz/week     Comment: 11/11/2014 "nothing in 7 years; never had a problem w/it"     Allergies   Banana; Lyrica [pregabalin]; and Pollen extract   Review of Systems Review of Systems  Constitutional: Negative for activity change, appetite change, chills, diaphoresis, fatigue and fever.  HENT: Negative.  Negative for rhinorrhea and sore throat.   Eyes: Negative.   Respiratory: Positive for cough (baseline cough),  sputum production (baseline productive sputum), chest tightness, shortness of breath and wheezing. Negative for hemoptysis and stridor.   Cardiovascular: Negative for chest pain, palpitations, orthopnea, leg swelling, syncope and PND.  Gastrointestinal: Negative for abdominal pain and vomiting.  Endocrine: Negative.   Genitourinary: Negative.   Skin: Negative for color change, pallor and rash.  Neurological: Negative.   All other systems reviewed and are negative.    Physical Exam  Updated Vital Signs BP 147/69   Pulse 77   Temp 98.9 F (37.2 C) (Oral)   Resp 21   Ht 5\' 1"  (1.549 m)   Wt 39.5 kg   SpO2 95%   BMI 16.44 kg/m   Physical Exam  Constitutional: She is oriented to person, place, and time. She appears well-developed and well-nourished. She is cooperative.  Non-toxic appearance. She does not have a sickly appearance. She does not appear ill. No distress. Nasal cannula in place.  Thin elderly female, no respiratory distress, NAD, seated upright in ER gurney, talking on cell phone  HENT:  Head: Normocephalic and atraumatic.  Right Ear: External ear normal.  Left Ear: External ear normal.  Nose: Nose normal.  Mouth/Throat: Oropharynx is clear and moist. No oropharyngeal exudate.  Eyes: Conjunctivae and EOM are normal. Pupils are equal, round, and reactive to light. Right eye exhibits no discharge. Left eye exhibits no discharge. No scleral icterus.  Neck: Normal range of motion. Neck supple. No JVD present. No tracheal deviation present.  Cardiovascular: Normal rate and regular rhythm.   Pulmonary/Chest: Effort normal. No accessory muscle usage or stridor. No tachypnea. No respiratory distress. She has decreased breath sounds. She has wheezes. She has no rhonchi. She has no rales.  No respiratory distress, no retractions, no tachypnea, increased AP diameter with kyphosis, diminished breath sounds throughout all lung fields with prolonged end expiratory wheeze, occasional  cough No cyanosis  Musculoskeletal: Normal range of motion. She exhibits no edema.  Lymphadenopathy:    She has no cervical adenopathy.  Neurological: She is alert and oriented to person, place, and time. She exhibits normal muscle tone. Coordination normal.  Skin: Skin is warm and dry. No rash noted. She is not diaphoretic. No erythema. No pallor.  Psychiatric: She has a normal mood and affect. Her behavior is normal. Judgment and thought content normal.  Nursing note and vitals reviewed.    ED Treatments / Results  Labs (all labs ordered are listed, but only abnormal results are displayed) Labs Reviewed - No data to display  EKG  EKG Interpretation  Date/Time:  Monday December 07 2015 10:02:13 EDT Ventricular Rate:  79 PR Interval:    QRS Duration: 73 QT Interval:  364 QTC Calculation: 418 R Axis:   92 Text Interpretation:  Sinus rhythm Anterior infarct, old Baseline wander in lead(s) V2 Confirmed by Jeneen Rinks  MD, [place] ([id number]) on 12/07/2015 10:07:58 AM       Radiology No results found.  Procedures Procedures (including critical care time)  Medications Ordered in ED Medications  ipratropium (ATROVENT) nebulizer solution 0.5 mg (0.5 mg Nebulization Given 12/07/15 1049)  albuterol (PROVENTIL) (2.5 MG/3ML) 0.083% nebulizer solution 5 mg (5 mg Nebulization Given 12/07/15 1049)  HYDROcodone-acetaminophen (NORCO/VICODIN) 5-325 MG per tablet 1 tablet (1 tablet Oral Given 12/07/15 1048)     Initial Impression / Assessment and Plan / ED Course  I have reviewed the triage vital signs and the nursing notes.  Pertinent labs & imaging results that were available during my care of the patient were reviewed by me and considered in my medical decision making (see chart for details).  Clinical Course   Patient with acute shortness of breath this morning unrelieved by her home albuterol, EMS was called and she was brought to the ER for evaluation, and route was given 10 mg albuterol, 1  mg Atrovent, 125 mg Solu-Medrol, at time of exam she appears comfortable without respiratory distress, no retractions, able to speak in complete  sentences. She is on 3 L of home oxygen, same in the ER at time of exam, pt sitting upright in ER gurney, well appearing, 98% pulseox, lung exam with very diminished breath sounds and tight difuse expiratory wheeze.  Patient clinically reports feeling much better. Will give one more breathing treatment and reevaluate.  Discussed with the patient her advanced COPD, she appears to have a good understanding of advance disease and she does continue to smoke as her "last pleasure." She denies any worsened cough or worsening sputum production. She states that during the winter months she does have more exacerbations and is on steroids more often.  She denies any fevers, chills, sweats. She was ambulated around the ER with monitoring is able to ambulate with O2, able to maintain sats, no exertional dyspnea.  Feel she is stable to d/c home.    Case discussed with Dr. Tanna Furry, who was seen and evaluated the patient, agrees with assessment and plan to discharge home with steroids, encouraged follow-up PCP and given pulmonology's information.  Final Clinical Impressions(s) / ED Diagnoses   Final diagnoses:  COPD exacerbation [facility])    New Prescriptions Discharge Medication List as of 12/07/2015 11:30 AM    START taking these medications   Details  !! predniSONE (DELTASONE) 20 MG tablet Take 2 tablets (40 mg total) by mouth daily. Take 40 mg by mouth daily for 3 days, then 20mg  by mouth daily for 3 days, then 10mg  daily for 3 days, Starting Mon 12/07/2015, Print     !! - Potential duplicate medications found. Please discuss with provider.       Delsa Grana, PA-C 12/07/15 [place], MD 12/11/15 [phone]

## 2015-12-07 NOTE — ED Triage Notes (Addendum)
[facility]- pt coming from [facility]. Hx of COPD, pt was to take neb treatment at 0900 but called EMS. Pt had wheezing throughout lung fields, initially 88% on RA. Pt uses 3L of O2 chronically. Pt had total of 10mg  albuterol, 1mg  atrovent, and 125mg  of solumedrol PTA. Vitals stable

## 2015-12-07 NOTE — ED Provider Notes (Signed)
Patient seen and evaluated. D/W L. Tapia PA-c.  Pt getting significant relief with albuterol neb. Received steroids by the medics. On exam she has an index for a wheeze. Overall her work of breathing is much improved. Well oxygenated on her 3 L. Afebrile. Not tachycardic. EKG without changes. Plan will be discharge home prednisone albuterol treatments.   Tanna Furry, MD 12/07/15 1136

## 2015-12-15 ENCOUNTER — Emergency Department (HOSPITAL_COMMUNITY): Payer: Medicare Other

## 2015-12-15 ENCOUNTER — Encounter (HOSPITAL_COMMUNITY): Payer: Self-pay | Admitting: Emergency Medicine

## 2015-12-15 ENCOUNTER — Emergency Department (HOSPITAL_COMMUNITY)
Admission: EM | Admit: 2015-12-15 | Discharge: 2015-12-15 | Disposition: A | Discharge status: Home / Self Care | Payer: Medicare Other | Attending: Emergency Medicine | Admitting: Emergency Medicine

## 2015-12-15 DIAGNOSIS — Y939 Activity, unspecified: Secondary | ICD-10-CM | POA: Diagnosis not present

## 2015-12-15 DIAGNOSIS — W010XXA Fall on same level from slipping, tripping and stumbling without subsequent striking against object, initial encounter: Secondary | ICD-10-CM | POA: Diagnosis not present

## 2015-12-15 DIAGNOSIS — S20211A Contusion of right front wall of thorax, initial encounter: Secondary | ICD-10-CM | POA: Diagnosis not present

## 2015-12-15 DIAGNOSIS — S40021A Contusion of right upper arm, initial encounter: Secondary | ICD-10-CM | POA: Diagnosis not present

## 2015-12-15 DIAGNOSIS — Y929 Unspecified place or not applicable: Secondary | ICD-10-CM | POA: Diagnosis not present

## 2015-12-15 DIAGNOSIS — F17210 Nicotine dependence, cigarettes, uncomplicated: Secondary | ICD-10-CM | POA: Diagnosis not present

## 2015-12-15 DIAGNOSIS — S42001A Fracture of unspecified part of right clavicle, initial encounter for closed fracture: Secondary | ICD-10-CM

## 2015-12-15 DIAGNOSIS — J449 Chronic obstructive pulmonary disease, unspecified: Secondary | ICD-10-CM | POA: Diagnosis not present

## 2015-12-15 DIAGNOSIS — S42031A Displaced fracture of lateral end of right clavicle, initial encounter for closed fracture: Secondary | ICD-10-CM | POA: Diagnosis not present

## 2015-12-15 DIAGNOSIS — Y999 Unspecified external cause status: Secondary | ICD-10-CM | POA: Insufficient documentation

## 2015-12-15 DIAGNOSIS — S40011A Contusion of right shoulder, initial encounter: Secondary | ICD-10-CM | POA: Diagnosis present

## 2015-12-15 MED ORDER — ALBUTEROL SULFATE HFA 108 (90 BASE) MCG/ACT IN AERS
2.0000 | INHALATION_SPRAY | Freq: Once | RESPIRATORY_TRACT | Status: AC
  Administered 2015-12-15: 2 via RESPIRATORY_TRACT
  Filled 2015-12-15: qty 6.7

## 2015-12-15 MED ORDER — HYDROCODONE-ACETAMINOPHEN 5-325 MG PO TABS
2.0000 | ORAL_TABLET | Freq: Once | ORAL | Status: AC
  Administered 2015-12-15: 2 via ORAL
  Filled 2015-12-15: qty 2

## 2015-12-15 MED ORDER — HYDROCODONE-ACETAMINOPHEN 5-325 MG PO TABS: 1.0000 | ORAL_TABLET | Freq: Four times a day (QID) | ORAL | 0 refills | Status: DC | PRN

## 2015-12-15 MED ORDER — HYDROCODONE-ACETAMINOPHEN 5-325 MG PO TABS
1.0000 | ORAL_TABLET | Freq: Once | ORAL | Status: AC
  Administered 2015-12-15: 1 via ORAL
  Filled 2015-12-15: qty 1

## 2015-12-15 NOTE — ED Notes (Signed)
Patient transported to X-ray 

## 2015-12-15 NOTE — Progress Notes (Signed)
CSW spoke with patient at bedside with no one present. Patient reports she had a fall late Saturday night, early Sunday morning and the pain is as bad now as it was when she fell. Patient reports she came to [facility] to "get it looked at". Patient reports she is from [facility] on [place] and she has been there for one year. Patient reports she can feed herself, however, she states she needs assistance with bathing and dressing. Patient reports she has fallen a "couple times" in the past two weeks. Patient reports she has a brother from whom she is estranged that lives in [place]. Patient reports she has a sister, [name] that lives and [place], [name] with whom she talks with everyday. Patient reports her sister is coming to visit her this weekend. No questions noted for CSW at this time.  Genice Rouge [id number] ED CSW 12/15/2015 12:41 PM

## 2015-12-15 NOTE — ED Notes (Signed)
Bed: WA20 Expected date:  Expected time:  Means of arrival:  Comments: Ems F fall

## 2015-12-15 NOTE — ED Notes (Signed)
[facility] called for transport.  

## 2015-12-15 NOTE — Discharge Instructions (Signed)
It was our pleasure to provide your ER care today - we hope that you feel better.  Your xrays show a comminuted distal right clavicle fracture.   Wear shoulder immobilizer/sling for comfort and support.   Ice/coldpack to sore area.   You may take hydrocodone as need for pain. Do not take tylenol or acetaminophen containing medication when taking hydrocodone.  Follow up with orthopedist in the coming week - see referral - call office today or tomorrow morning to arrange appointment.  Return to ER if worse, new symptoms, weak/fainting, numbness/weakness, other concern.

## 2015-12-15 NOTE — ED Provider Notes (Signed)
[facility] DEPT Provider Note   CSN: [id number] Arrival date & time: 12/15/15  1127     History   Chief Complaint Chief Complaint  Patient presents with  . Fall  . Shoulder Pain    HPI [name] is a 65 y.o. female.  Patient c/o fall at [facility] 3 nights ago, states had gotten up to bathroom - trip and fall.  ?momentary loc.  Patient was able to get up under her own power.  C/o persistent pain, swelling, and bruising to right shoulder. Worse w palpation or any movement of shoulder. No associated arm numbness or weakness. Denies chest pain or sob. No abd pain. No headache. No neck or back pain. Other than shoulder area pain, pt denies other injury. Skin intact. States otherwise recent health c/w baseline.    The history is provided by the patient.  Fall  Pertinent negatives include no chest pain, no abdominal pain, no headaches and no shortness of breath.  Shoulder Pain   Pertinent negatives include no numbness.    Past Medical History:  Diagnosis Date  . Anemia   . Anxiety   . Arthritis    "all in my body, primarily left hip" (11/11/2014)  . Bipolar disorder ([place])   . Chronic lower back pain   . Chronic pain syndrome    follows at [facility]  . COPD (chronic obstructive pulmonary disease) (HCC)    oxygen dependent (3L home continuous)  . DEFICIENCY, VITAMIN D NOS 01/03/2007  . Depression   . Duodenal ulcer, chronic   . Elevated liver function tests   . History of blood transfusion 03/2014; 10/2014   "low HgB" (11/11/2014)  . Hyperthyroidism    "borderline"  . On home oxygen therapy    "3L; 24/7" (8/30//2016)  . OSTEOPOROSIS 06/17/2009   DEXA 05/2009 : L femur -2.9; R femur -2.5. Alendronate on med list but not taking. Needs addressed ASAP as h/o fractures.    . Pneumonia X 1?  . Sciatic pain   . Shortness of breath    "all the time" (02/27/2013)  . Substance abuse     narcotics, alcohol, tobacco  . Suicidal ideation 2007    attempted overdose 2012   Dr. Tivis Ringer report    Patient Active Problem List   Diagnosis Date Noted  . Acute exacerbation of chronic obstructive pulmonary disease (COPD) ([place]) 05/06/2015  . COPD exacerbation ([place]) 01/22/2015  . Constipation   . Orthostatic hypotension 11/11/2014  . Duodenal ulcer   . Polypharmacy 10/24/2014  . Smoking 10/24/2014  . Trochanteric bursitis of left hip 10/01/2014  . Rash and nonspecific skin eruption 09/17/2014  . Left hip pain 09/17/2014  . Immunization, tetanus-diphtheria 09/05/2014  . Iron deficiency anemia 11/12/2013  . Health care maintenance 05/14/2013  . Nausea and vomiting 04/17/2013  . Insomnia 03/06/2013  . GERD (gastroesophageal reflux disease) 03/06/2013  . Protein-calorie malnutrition, severe ([place]) 03/04/2013  . Chronic pain 03/02/2013  . Dyslipidemia 02/26/2013  . Seasonal allergies 07/09/2012  . 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 Osteoporosis 06/17/2009  . Bipolar I disorder, most recent episode depressed ([place]) 06/02/2009  . Vitamin D deficiency 01/03/2007  . BACK PAIN, LUMBAR 12/07/2005    Past Surgical History:  Procedure Laterality Date  . ANKLE DEBRIDEMENT Left 10/2010  . AUGMENTATION MAMMAPLASTY  ~ 2007  . CESAREAN SECTION  1974; 1979  . ESOPHAGOGASTRODUODENOSCOPY N/A 11/07/2014   Procedure: ESOPHAGOGASTRODUODENOSCOPY (EGD);  Surgeon: Irene Shipper, MD;  Location:  [facility];  Service: Endoscopy;  Laterality: N/A;  . FRACTURE SURGERY    . ORIF ANKLE FRACTURE Left 10/2010  . TUBAL LIGATION  1979    OB History    No data available       Home Medications    Prior to Admission medications   Medication Sig Start Date End Date Taking? Authorizing Provider  alendronate (FOSAMAX) 70 MG tablet Take 1 tablet (70 mg total) by mouth once a week. Take with a full glass of water on an empty stomach. 10/16/14  Yes Riccardo Dubin, MD  Cholecalciferol 1000 units TBDP Take 1,000 mg by mouth daily.   Yes Historical  Provider, MD  fluticasone (FLONASE) 50 MCG/ACT nasal spray Place 2 sprays into both nostrils daily.   Yes Historical Provider, MD  Fluticasone-Salmeterol (ADVAIR DISKUS) 250-50 MCG/DOSE AEPB INHALE 1 PUFF INTO THE LUNGS 2 TIMES DAILY 03/19/14  Yes Jones Bales, MD  gabapentin (NEURONTIN) 300 MG capsule Take 300 mg by mouth at bedtime.  06/25/14  Yes Historical Provider, MD  guaiFENesin-dextromethorphan (ROBITUSSIN DM) 100-10 MG/5ML syrup Take 5 mLs by mouth every 4 (four) hours as needed for cough. 02/04/15  Yes Riccardo Dubin, MD  HYDROcodone-acetaminophen (NORCO/VICODIN) 5-325 MG tablet Take 1 tablet by mouth every 6 (six) hours.    Yes Historical Provider, MD  ipratropium-albuterol (DUONEB) 0.5-2.5 (3) MG/3ML SOLN 4 times a day (breakfast, lunch, dinner, bedtime).Can take 2 additional treatments if needed on bad days. DX J43.8 Patient taking differently: Take 3 mLs by mouth 4 (four) times daily.  07/03/14  Yes Kathee Delton, MD  lamoTRIgine (LAMICTAL) 100 MG tablet TAKE ONE TABLET EVERY DAY 09/23/14  Yes Sid Falcon, MD  levocetirizine (XYZAL) 5 MG tablet Take 5 mg by mouth at bedtime.    Yes Historical Provider, MD  Melatonin 10 MG TABS Take 1 tablet by mouth at bedtime as needed. Patient taking differently: Take 1 tablet by mouth at bedtime.  05/26/15  Yes Bartholomew Crews, MD  meloxicam (MOBIC) 7.5 MG tablet Take 7.5 mg by mouth daily.   Yes Historical Provider, MD  NUTRITIONAL SUPPLEMENT LIQD Take 1 Bottle by mouth 3 (three) times daily between meals. Mighty shakes   Yes Historical Provider, MD  OLANZapine (ZYPREXA) 2.5 MG tablet Take 2.5 mg by mouth at bedtime.   Yes Historical Provider, MD  pantoprazole (PROTONIX) 40 MG tablet Take 1 tablet (40 mg total) by mouth 2 (two) times daily. 11/09/14  Yes Burgess Estelle, MD  polyethylene glycol powder (GLYCOLAX/MIRALAX) powder TAKE 17G DISSOLVED IN 8OZ WATER DAILY ASNEEDED FOR MODERATE CONSTIPATION 09/23/14  Yes Sid Falcon, MD  predniSONE  (DELTASONE) 10 MG tablet Take 10 mg by mouth daily with breakfast.   Yes Historical Provider, MD  PROAIR HFA 108 (90 BASE) MCG/ACT inhaler INHALE ONE PUFF BY MOUTH EVERY 2 TO 4 HOURS AS NEEDED FOR WHEEZING 05/21/14  Yes Riccardo Dubin, MD  senna (SENOKOT) 8.6 MG TABS tablet Take 2 tablets (17.2 mg total) by mouth 3 (three) times daily as needed for mild constipation. 11/13/14  Yes Jule Ser, DO  venlafaxine XR (EFFEXOR-XR) 37.5 MG 24 hr capsule Take 37.5 mg by mouth every evening. 10/24/15  Yes Historical Provider, MD  white petrolatum (VASELINE) GEL Apply 1 application topically daily. To both feet for dry skin   Yes Historical Provider, MD  zolpidem (AMBIEN) 5 MG tablet Take 5 mg by mouth at bedtime as needed for sleep.   Yes Historical Provider, MD  OXYGEN Inhale 3 L into the lungs continuous.    Historical Provider, MD  Spacer/Aero-Holding Chambers (AEROCHAMBER Z-STAT PLUS/MEDIUM) inhaler Use with rescue inhaler Patient not taking: Reported on 12/07/2015 05/08/15   Zada Finders, MD    Family History Family History  Problem Relation Age of Onset  . Myelodysplastic syndrome Father     Died from myelofibrosis though diagnosed post-mortem  . Stroke Neg Hx   . Cancer Neg Hx     Social History Social History  Substance Use Topics  . Smoking status: Current Every Day Smoker    Packs/day: 0.20    Years: 45.00    Types: Cigarettes  . Smokeless tobacco: Never Used     Comment: 11/11/2014 ""smoking 1 cigarettes/day; that's down from 2 ppd"  . Alcohol use 0.0 oz/week     Comment: 11/11/2014 "nothing in 7 years; never had a problem w/it"     Allergies   Banana; Lyrica [pregabalin]; and Pollen extract   Review of Systems Review of Systems  Constitutional: Negative for fever.  HENT: Negative for nosebleeds.   Eyes: Negative for pain.  Respiratory: Negative for shortness of breath.   Cardiovascular: Negative for chest pain.  Gastrointestinal: Negative for abdominal pain and vomiting.    Genitourinary: Negative for flank pain.  Musculoskeletal: Negative for back pain and neck pain.  Skin: Negative for wound.  Neurological: Negative for weakness, numbness and headaches.  Hematological: Does not bruise/bleed easily.  Psychiatric/Behavioral: Negative for confusion.     Physical Exam Updated Vital Signs BP 154/82 (BP Location: Left Arm)   Pulse 88   Temp 98.5 F (36.9 C) (Oral)   Resp 18   Ht 5\' 1"  (1.549 m)   Wt 39.5 kg   SpO2 100%   BMI 16.44 kg/m   Physical Exam  Constitutional: She appears well-developed and well-nourished. No distress.  HENT:  Head: Atraumatic.  Eyes: Conjunctivae are normal. No scleral icterus.  Neck: Neck supple. No tracheal deviation present.  Cardiovascular: Normal rate, regular rhythm, normal heart sounds and intact distal pulses.   Pulmonary/Chest: Effort normal and breath sounds normal. No respiratory distress. She exhibits tenderness.  Right clavicle and upper chest tenderness  Abdominal: Soft. Normal appearance. She exhibits no distension. There is no tenderness.  Musculoskeletal:  Marked tenderness right shoulder, with diffuse ecchymosis to right shoulder, right upper chest, and right upper arm. Radial pulse 2+.   CTLS spine, non tender, aligned, no step off.   Neurological: She is alert.  RUE, motor 5/5, sens intact.   Skin: Skin is warm and dry. No rash noted. She is not diaphoretic.  Psychiatric: She has a normal mood and affect.  Nursing note and vitals reviewed.    ED Treatments / Results  Labs (all labs ordered are listed, but only abnormal results are displayed) Labs Reviewed - No data to display  EKG  EKG Interpretation None       Radiology Dg Ribs Unilateral W/chest Right  Result Date: 12/15/2015 CLINICAL DATA:  Pain in right shoulder.  Found on floor. EXAM: RIGHT RIBS AND CHEST - 3+ VIEW COMPARISON:  05/06/2015 FINDINGS: Old healed right sixth rib fracture identified. There is no evidence of  pneumothorax or pleural effusion. Both lungs are clear. Advanced changes of COPD/emphysema. Heart size and mediastinal contours are within normal limits. IMPRESSION: 1. No acute findings identified. Advanced changes of COPD/emphysema noted. Electronically Signed   By: Kerby Moors M.D.   On: 12/15/2015 13:27   Dg Shoulder Right  Result Date:  12/15/2015 CLINICAL DATA:  RIGHT shoulder pain EXAM: RIGHT SHOULDER - 2+ VIEW COMPARISON:  None. FINDINGS: Glenohumeral joint is intact. No evidence of scapular fracture or humeral fracture. The acromioclavicular joint is intact. IMPRESSION: No fracture or dislocation. Electronically Signed   By: Suzy Bouchard M.D.   On: 12/15/2015 13:28    Procedures Procedures (including critical care time)  Medications Ordered in ED Medications  HYDROcodone-acetaminophen (NORCO/VICODIN) 5-325 MG per tablet 2 tablet (2 tablets Oral Given 12/15/15 1320)     Initial Impression / Assessment and Plan / ED Course  I have reviewed the triage vital signs and the nursing notes.  Pertinent labs & imaging results that were available during my care of the patient were reviewed by me and considered in my medical decision making (see chart for details).  Clinical Course    Xrays eval by radiology and read as neg acute - although ?distal clavicle irregularity on select views.  Given persistent pain, marked ecchymosis and tenderness to area, will get additional imaging to r/o occult injury or fracture.   Hydrocodone po for pain. Icepack to sore area.   Reviewed nursing notes and prior charts for additional history.   Right shoulder immobilizer/sling.   Pain improved.        Final Clinical Impressions(s) / ED Diagnoses   Final diagnoses:  None    New Prescriptions New Prescriptions   No medications on file     Lajean Saver, MD 12/15/15 1530

## 2015-12-15 NOTE — ED Triage Notes (Signed)
Per EMS, pt from [place] c/o right shoulder pain after fall Saturday night. Pt rates pain 10/10. Bruising to right arm. Pain increases with movement. Denies head injury and LOC.

## 2015-12-15 NOTE — ED Notes (Signed)
Patient transported to CT 

## 2015-12-17 ENCOUNTER — Emergency Department (HOSPITAL_COMMUNITY)
Admission: EM | Admit: 2015-12-17 | Discharge: 2015-12-18 | Disposition: A | Discharge status: Home / Self Care | Payer: Medicare Other | Attending: Emergency Medicine | Admitting: Emergency Medicine

## 2015-12-17 ENCOUNTER — Encounter (HOSPITAL_COMMUNITY): Payer: Self-pay | Admitting: Emergency Medicine

## 2015-12-17 DIAGNOSIS — W07XXXA Fall from chair, initial encounter: Secondary | ICD-10-CM | POA: Insufficient documentation

## 2015-12-17 DIAGNOSIS — S0990XA Unspecified injury of head, initial encounter: Secondary | ICD-10-CM | POA: Diagnosis present

## 2015-12-17 DIAGNOSIS — W19XXXA Unspecified fall, initial encounter: Secondary | ICD-10-CM

## 2015-12-17 DIAGNOSIS — S90122A Contusion of left lesser toe(s) without damage to nail, initial encounter: Secondary | ICD-10-CM | POA: Diagnosis not present

## 2015-12-17 DIAGNOSIS — Y999 Unspecified external cause status: Secondary | ICD-10-CM | POA: Insufficient documentation

## 2015-12-17 DIAGNOSIS — Z79899 Other long term (current) drug therapy: Secondary | ICD-10-CM | POA: Diagnosis not present

## 2015-12-17 DIAGNOSIS — F17210 Nicotine dependence, cigarettes, uncomplicated: Secondary | ICD-10-CM | POA: Insufficient documentation

## 2015-12-17 DIAGNOSIS — S90112A Contusion of left great toe without damage to nail, initial encounter: Secondary | ICD-10-CM | POA: Diagnosis not present

## 2015-12-17 DIAGNOSIS — S0083XA Contusion of other part of head, initial encounter: Secondary | ICD-10-CM | POA: Insufficient documentation

## 2015-12-17 DIAGNOSIS — Y929 Unspecified place or not applicable: Secondary | ICD-10-CM | POA: Insufficient documentation

## 2015-12-17 DIAGNOSIS — J449 Chronic obstructive pulmonary disease, unspecified: Secondary | ICD-10-CM | POA: Insufficient documentation

## 2015-12-17 DIAGNOSIS — E039 Hypothyroidism, unspecified: Secondary | ICD-10-CM | POA: Insufficient documentation

## 2015-12-17 DIAGNOSIS — Y9389 Activity, other specified: Secondary | ICD-10-CM | POA: Diagnosis not present

## 2015-12-17 NOTE — ED Triage Notes (Signed)
Pt coming from [place] . Pt broke her clavicle 2 weeks ago after falling. Pt fell tonight after falling asleep while sitting upright in a chair the she sleeps in. Pt hit her head. 1.5 in hematoma about her L eye. R sided deficit that started about 1 month ago.Pt is not on blood thinners. A&O.

## 2015-12-17 NOTE — ED Provider Notes (Signed)
[facility] DEPT Provider Note   CSN: [id number] Arrival date & time: 12/17/15  2325 By signing my name below, I, Dyke Brackett, attest that this documentation has been prepared under the direction and in the presence of Orpah Greek, MD.Electronically Signed: Dyke Brackett, Scribe. 12/17/2015. 11:47 PM.   History   Chief Complaint Chief Complaint  Patient presents with  . Fall   HPI [name] is a 65 y.o. female with hx of arthritis, osteoporosis, and chronic pain who presents to the Emergency Department complaining of sudden onset right sided headache s/p fall tonight.  She fell tonight after falling asleep while sitting upright in the chair that she sleeps in, hitting her head. Pt rates her pain as 10/10. No alleviating or modifying factors noted. Pt also complains of bilateral hip pain, back pain, and left arm pain. She fell last week and broke her clavicle. She is a resident at [facility]. Per nursing staff, pt has a left sided deficit that began about four weeks ago. Pt has no other complaints at this time.   The history is provided by the patient. No language interpreter was used.    Past Medical History:  Diagnosis Date  . Anemia   . Anxiety   . Arthritis    "all in my body, primarily left hip" (11/11/2014)  . Bipolar disorder ([place])   . Chronic lower back pain   . Chronic pain syndrome    follows at [facility]  . COPD (chronic obstructive pulmonary disease) (HCC)    oxygen dependent (3L home continuous)  . DEFICIENCY, VITAMIN D NOS 01/03/2007  . Depression   . Duodenal ulcer, chronic   . Elevated liver function tests   . History of blood transfusion 03/2014; 10/2014   "low HgB" (11/11/2014)  . Hyperthyroidism    "borderline"  . On home oxygen therapy    "3L; 24/7" (8/30//2016)  . OSTEOPOROSIS 06/17/2009   DEXA 05/2009 : L femur -2.9; R femur -2.5. Alendronate on med list but not taking. Needs addressed ASAP as h/o fractures.    .  Pneumonia X 1?  . Sciatic pain   . Shortness of breath    "all the time" (02/27/2013)  . Substance abuse     narcotics, alcohol, tobacco  . Suicidal ideation 2007    attempted overdose 2012  Dr. Tivis Ringer report    Patient Active Problem List   Diagnosis Date Noted  . Acute exacerbation of chronic obstructive pulmonary disease (COPD) ([place]) 05/06/2015  . COPD exacerbation ([place]) 01/22/2015  . Constipation   . Orthostatic hypotension 11/11/2014  . Duodenal ulcer   . Polypharmacy 10/24/2014  . Smoking 10/24/2014  . Trochanteric bursitis of left hip 10/01/2014  . Rash and nonspecific skin eruption 09/17/2014  . Left hip pain 09/17/2014  . Immunization, tetanus-diphtheria 09/05/2014  . Iron deficiency anemia 11/12/2013  . Health care maintenance 05/14/2013  . Nausea and vomiting 04/17/2013  . Insomnia 03/06/2013  . GERD (gastroesophageal reflux disease) 03/06/2013  . Protein-calorie malnutrition, severe ([place]) 03/04/2013  . Chronic pain 03/02/2013  . Dyslipidemia 02/26/2013  . Seasonal allergies 07/09/2012  . 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 Osteoporosis 06/17/2009  . Bipolar I disorder, most recent episode depressed ([place]) 06/02/2009  . Vitamin D deficiency 01/03/2007  . BACK PAIN, LUMBAR 12/07/2005    Past Surgical History:  Procedure Laterality Date  . ANKLE DEBRIDEMENT Left 10/2010  . AUGMENTATION MAMMAPLASTY  ~ 2007  . CESAREAN SECTION  1974; 1979  . ESOPHAGOGASTRODUODENOSCOPY N/A 11/07/2014   Procedure: ESOPHAGOGASTRODUODENOSCOPY (EGD);  Surgeon: Irene Shipper, MD;  Location: [facility] ENDOSCOPY;  Service: Endoscopy;  Laterality: N/A;  . FRACTURE SURGERY    . ORIF ANKLE FRACTURE Left 10/2010  . TUBAL LIGATION  1979    OB History    No data available       Home Medications    Prior to Admission medications   Medication Sig Start Date End Date Taking? Authorizing Provider  alendronate (FOSAMAX) 70 MG tablet Take 1 tablet (70  mg total) by mouth once a week. Take with a full glass of water on an empty stomach. 10/16/14  Yes Riccardo Dubin, MD  Cholecalciferol 1000 units TBDP Take 1,000 mg by mouth daily.   Yes Historical Provider, MD  fluticasone (FLONASE) 50 MCG/ACT nasal spray Place 2 sprays into both nostrils daily.   Yes Historical Provider, MD  Fluticasone-Salmeterol (ADVAIR DISKUS) 250-50 MCG/DOSE AEPB INHALE 1 PUFF INTO THE LUNGS 2 TIMES DAILY 03/19/14  Yes Jones Bales, MD  gabapentin (NEURONTIN) 300 MG capsule Take 300 mg by mouth at bedtime.  06/25/14  Yes Historical Provider, MD  guaiFENesin-dextromethorphan (ROBITUSSIN DM) 100-10 MG/5ML syrup Take 5 mLs by mouth every 4 (four) hours as needed for cough. 02/04/15  Yes Riccardo Dubin, MD  HYDROcodone-acetaminophen (NORCO/VICODIN) 5-325 MG tablet Take 1 tablet by mouth every 6 (six) hours as needed for moderate pain or severe pain. 12/15/15  Yes Lajean Saver, MD  ipratropium-albuterol (DUONEB) 0.5-2.5 (3) MG/3ML SOLN 4 times a day (breakfast, lunch, dinner, bedtime).Can take 2 additional treatments if needed on bad days. DX J43.8 Patient taking differently: Take 3 mLs by mouth 4 (four) times daily.  07/03/14  Yes Kathee Delton, MD  lamoTRIgine (LAMICTAL) 100 MG tablet TAKE ONE TABLET EVERY DAY 09/23/14  Yes Sid Falcon, MD  levocetirizine (XYZAL) 5 MG tablet Take 5 mg by mouth at bedtime.    Yes Historical Provider, MD  Melatonin 10 MG TABS Take 1 tablet by mouth at bedtime as needed. Patient taking differently: Take 1 tablet by mouth at bedtime.  05/26/15  Yes Bartholomew Crews, MD  meloxicam (MOBIC) 7.5 MG tablet Take 7.5 mg by mouth daily.   Yes Historical Provider, MD  OLANZapine (ZYPREXA) 2.5 MG tablet Take 2.5 mg by mouth at bedtime.   Yes Historical Provider, MD  pantoprazole (PROTONIX) 40 MG tablet Take 1 tablet (40 mg total) by mouth 2 (two) times daily. 11/09/14  Yes Burgess Estelle, MD  polyethylene glycol powder (GLYCOLAX/MIRALAX) powder TAKE 17G  DISSOLVED IN 8OZ WATER DAILY ASNEEDED FOR MODERATE CONSTIPATION 09/23/14  Yes Sid Falcon, MD  PROAIR HFA 108 (90 BASE) MCG/ACT inhaler INHALE ONE PUFF BY MOUTH EVERY 2 TO 4 HOURS AS NEEDED FOR WHEEZING 05/21/14  Yes Riccardo Dubin, MD  senna (SENOKOT) 8.6 MG TABS tablet Take 2 tablets (17.2 mg total) by mouth 3 (three) times daily as needed for mild constipation. 11/13/14  Yes Jule Ser, DO  venlafaxine XR (EFFEXOR-XR) 37.5 MG 24 hr capsule Take 37.5 mg by mouth every evening. 10/24/15  Yes Historical Provider, MD  white petrolatum (VASELINE) GEL Apply 1 application topically daily. To both feet for dry skin   Yes Historical Provider, MD  zolpidem (AMBIEN) 5 MG tablet Take 5 mg by mouth at bedtime as needed for sleep.   Yes Historical Provider, MD  OXYGEN Inhale 3 L into the lungs continuous.    Historical Provider, MD  Spacer/Aero-Holding Chambers (AEROCHAMBER Z-STAT PLUS/MEDIUM) inhaler Use with rescue inhaler 05/08/15   Zada Finders, MD    Family History Family History  Problem Relation Age of Onset  . Myelodysplastic syndrome Father     Died from myelofibrosis though diagnosed post-mortem  . Stroke Neg Hx   . Cancer Neg Hx     Social History Social History  Substance Use Topics  . Smoking status: Current Every Day Smoker    Packs/day: 0.20    Years: 45.00    Types: Cigarettes  . Smokeless tobacco: Never Used     Comment: 11/11/2014 ""smoking 1 cigarettes/day; that's down from 2 ppd"  . Alcohol use 0.0 oz/week     Comment: 11/11/2014 "nothing in 7 years; never had a problem w/it"     Allergies   Banana; Lyrica [pregabalin]; and Pollen extract   Review of Systems Review of Systems  Musculoskeletal: Positive for arthralgias, back pain and myalgias.  Skin: Positive for wound.  Neurological: Positive for headaches.  All other systems reviewed and are negative.  Physical Exam Updated Vital Signs BP 169/65   Pulse 90   Temp 98 F (36.7 C) (Oral)   Resp 13   SpO2 99%    Physical Exam  Constitutional: She is oriented to person, place, and time. She appears well-developed and well-nourished. No distress.  HENT:  Head: Normocephalic and atraumatic.  Right Ear: Hearing normal.  Left Ear: Hearing normal.  Nose: Nose normal.  Mouth/Throat: Oropharynx is clear and moist and mucous membranes are normal.  Eyes: Conjunctivae and EOM are normal. Pupils are equal, round, and reactive to light.  Neck: Normal range of motion. Neck supple.  Cardiovascular: Regular rhythm, S1 normal and S2 normal.  Exam reveals no gallop and no friction rub.   No murmur heard. Pulmonary/Chest: Effort normal and breath sounds normal. No respiratory distress. She exhibits no tenderness.  Abdominal: Soft. Normal appearance and bowel sounds are normal. There is no hepatosplenomegaly. There is no tenderness. There is no rebound, no guarding, no tenderness at McBurney's point and negative Murphy's sign. No hernia.  Musculoskeletal: Normal range of motion. She exhibits edema.  Ecchymosis on all of the toes to the left foot, swelling to the left foot.  Neurological: She is alert and oriented to person, place, and time. No cranial nerve deficit or sensory deficit. Coordination normal. GCS eye subscore is 4. GCS verbal subscore is 5. GCS motor subscore is 6.  Somnolent, but awakens to voice. Left upper extremity has no extension at wrist, hand and fingers.   Skin: Skin is warm, dry and intact. No rash noted. No cyanosis.  Psychiatric: She has a normal mood and affect. Her speech is normal and behavior is normal. Thought content normal.  Nursing note and vitals reviewed.  ED Treatments / Results  DIAGNOSTIC STUDIES:  Oxygen Saturation is 100% on [place] 3 L/ min, normal by my interpretation.    COORDINATION OF CARE:  11:43PM Discussed treatment plan with pt at bedside and pt agreed to plan.  Labs (all labs ordered are listed, but only abnormal results are displayed) Labs Reviewed  CBC WITH  DIFFERENTIAL/PLATELET - Abnormal; Notable for the following:       Result Value   Hemoglobin 11.1 (*)    Monocytes Absolute 1.1 (*)    All other components within normal limits  COMPREHENSIVE METABOLIC PANEL - Abnormal; Notable for the following:    Chloride 95 (*)    CO2 33 (*)    Creatinine, Ser 0.42 (*)  Calcium 8.4 (*)    Total Protein 5.8 (*)    Albumin 3.4 (*)    All other components within normal limits  URINALYSIS, ROUTINE W REFLEX MICROSCOPIC (NOT AT [facility]) - Abnormal; Notable for the following:    Specific Gravity, Urine 1.003 (*)    Leukocytes, UA TRACE (*)    All other components within normal limits  URINE RAPID DRUG SCREEN, HOSP PERFORMED - Abnormal; Notable for the following:    Opiates POSITIVE (*)    All other components within normal limits  URINE MICROSCOPIC-ADD ON - Abnormal; Notable for the following:    Squamous Epithelial / LPF 0-5 (*)    Bacteria, UA RARE (*)    All other components within normal limits  I-STAT CG4 LACTIC ACID, ED  I-STAT TROPOININ, ED    EKG  EKG Interpretation  Date/Time:  Friday December 18 2015 00:05:16 EDT Ventricular Rate:  95 PR Interval:    QRS Duration: 70 QT Interval:  364 QTC Calculation: 458 R Axis:   91 Text Interpretation:  Sinus rhythm Multiform ventricular premature complexes Probable left atrial enlargement Anteroseptal infarct, age indeterminate Baseline wander in lead(s) V1 V3 No significant change since last tracing Confirmed by Betsey Holiday  MD, Tanveer Dobberstein [phone]) on 12/18/2015 12:13:23 AM       Radiology Dg Chest 1 View  Result Date: 12/18/2015 CLINICAL DATA:  Fall tonight. Diffuse pain. Broken right clavicle from previous fall last week. EXAM: CHEST 1 VIEW COMPARISON:  12/15/2015 FINDINGS: Normal heart size and pulmonary vascularity. Emphysematous changes and scattered fibrosis in the lungs. No focal airspace disease or consolidation. No pleural effusion or pneumothorax. Mediastinal contours appear intact.  Fracture of the right distal clavicle is again demonstrated. Old right rib fracture. IMPRESSION: Emphysematous changes and fibrosis in the lungs. No evidence of active pulmonary disease. Electronically Signed   By: Lucienne Capers M.D.   On: 12/18/2015 01:20   Dg Ribs Bilateral  Result Date: 12/18/2015 CLINICAL DATA:  Fall tonight, diffuse pain. Possible acute LEFT rib fractures. EXAM: BILATERAL RIBS - 3+ VIEW COMPARISON:  Chest radiograph December 18, 2015 at 0001 hours FINDINGS: No acute fracture or other bone lesions are seen involving the ribs. Old RIGHT posterior sixth rib fracture. Partially imaged distal RIGHT clavicle fracture better characterized on RIGHT shoulder CT December 15, 2015. Breast implants. Emphysema. IMPRESSION: No acute fracture deformity. Electronically Signed   By: Elon Alas M.D.   On: 12/18/2015 03:27   Dg Thoracic Spine 2 View  Result Date: 12/18/2015 CLINICAL DATA:  [place] off chair after falling asleep. Fall last week resulting in RIGHT clavicle fracture. History of osteoporosis. EXAM: THORACIC SPINE 2 VIEWS ; LUMBAR SPINE - COMPLETE 4+ VIEW COMPARISON:  CT abdomen and pelvis November 04, 2014 inch chest radiograph May 06, 2015 FINDINGS: Thoracic vertebral bodies intact and aligned with accentuated of thoracic kyphosis. Intervertebral disc heights preserved. Osteopenia. No destructive bony lesions. Prevertebral and paraspinal soft tissue planes are nonsuspicious. Apical bullous changes and included view of the chest. Five non rib-bearing lumbar-type vertebral bodies are intact with maintenance of the lumbar lordosis. Stable grade 1 L4-5 anterolisthesis. Broad thoracolumbar dextroscoliosis. Intervertebral disc heights are normal. No pars interarticularis defects. No destructive bony lesions. Osteopenia. Sacroiliac joints are symmetric. Moderate amount of retained large bowel stool. Included prevertebral and paraspinal soft tissue planes are non-suspicious. Mild vascular  calcifications. IMPRESSION: No acute thoracic or lumbar spine fracture deformity. Stable grade 1 L4-5 anterolisthesis. No thoracic spine malalignment. Electronically Signed   By: Sandie Ano  Bloomer M.D.   On: 12/18/2015 01:14   Dg Lumbar Spine Complete  Result Date: 12/18/2015 CLINICAL DATA:  [place] off chair after falling asleep. Fall last week resulting in RIGHT clavicle fracture. History of osteoporosis. EXAM: THORACIC SPINE 2 VIEWS ; LUMBAR SPINE - COMPLETE 4+ VIEW COMPARISON:  CT abdomen and pelvis November 04, 2014 inch chest radiograph May 06, 2015 FINDINGS: Thoracic vertebral bodies intact and aligned with accentuated of thoracic kyphosis. Intervertebral disc heights preserved. Osteopenia. No destructive bony lesions. Prevertebral and paraspinal soft tissue planes are nonsuspicious. Apical bullous changes and included view of the chest. Five non rib-bearing lumbar-type vertebral bodies are intact with maintenance of the lumbar lordosis. Stable grade 1 L4-5 anterolisthesis. Broad thoracolumbar dextroscoliosis. Intervertebral disc heights are normal. No pars interarticularis defects. No destructive bony lesions. Osteopenia. Sacroiliac joints are symmetric. Moderate amount of retained large bowel stool. Included prevertebral and paraspinal soft tissue planes are non-suspicious. Mild vascular calcifications. IMPRESSION: No acute thoracic or lumbar spine fracture deformity. Stable grade 1 L4-5 anterolisthesis. No thoracic spine malalignment. Electronically Signed   By: Elon Alas M.D.   On: 12/18/2015 01:14   Dg Clavicle Left  Result Date: 12/18/2015 CLINICAL DATA:  Fall tonight, diffuse pain. EXAM: LEFT CLAVICLE - 2+ VIEWS COMPARISON:  LEFT shoulder radiographs December 18, 2015 at 0001 hours FINDINGS: There is no evidence of fracture or other focal bone lesions. Osteopenia. Soft tissues are unremarkable. Apical bullous changes. IMPRESSION: Negative. Electronically Signed   By: Elon Alas  M.D.   On: 12/18/2015 03:24   Dg Shoulder Right  Result Date: 12/18/2015 CLINICAL DATA:  Left shoulder pain after a fall. History of right clavicular fracture last week following another fall. EXAM: RIGHT SHOULDER - 2+ VIEW COMPARISON:  12/15/2015 CT and radiographs right shoulder. FINDINGS: Again demonstrated, there is a comminuted fracture of the distal right clavicle. Appearance is similar to previous study, likely without interval change. Fracture of the acromion is also demonstrated but less well visualized than at CT. No new fractures or dislocation identified. Old fracture of the lateral right sixth rib. IMPRESSION: Previous fractures of the distal right clavicle and right acromion are again demonstrated. No new fracture or dislocation. Electronically Signed   By: Lucienne Capers M.D.   On: 12/18/2015 01:16   Ct Head Wo Contrast  Result Date: 12/18/2015 CLINICAL DATA:  Fall from chair with right-sided headache EXAM: CT HEAD WITHOUT CONTRAST CT CERVICAL SPINE WITHOUT CONTRAST TECHNIQUE: Multidetector CT imaging of the head and cervical spine was performed following the standard protocol without intravenous contrast. Multiplanar CT image reconstructions of the cervical spine were also generated. COMPARISON:  11/04/2014, 10/25/2010 FINDINGS: CT HEAD FINDINGS Brain: No evidence of acute infarction, hemorrhage, hydrocephalus, extra-axial collection or mass lesion/mass effect. Vascular: No hyperdense vessel or unexpected calcification. Skull: Mastoid air cells are clear.  No skull fracture identified. Sinuses/Orbits: Paranasal sinuses clear.  Orbits grossly intact. Other: Right frontal scalp hematoma. CT CERVICAL SPINE FINDINGS Alignment: Mild exaggeration of cervical lordosis. Minimal retrolisthesis of C3 on C4, C4 on C5 and C5 on C6. Skull base and vertebrae: Craniovertebral junction is intact. Vertebral bodies demonstrate normal stature. Soft tissues and spinal canal: Prevertebral soft tissue thickness  is normal. No bony canal compromise. Disc levels: Moderate disc space narrowing at C4-C5 with vacuum disc, mild narrowing at C5-C6 and moderate narrowing at C6-C7 with vacuum discs. Anterior osteophytes between C3 and C7. Multilevel bilateral facet disease with right foraminal narrowing at C3-C4, C4-C5, and C5-C6. Upper chest: Bilateral emphysematous disease  within the lung apices with probable scarring at the left lung apex. 1.4 cm hypodense nodule in the right lobe of the thyroid gland, similar compared to previous CT. Other: None IMPRESSION: 1. No CT evidence for acute intracranial abnormality. Right frontal scalp hematoma. 2. Degenerative changes of the cervical spine. No acute fracture or malalignment. 3. Bilateral emphysematous disease within the lung apices Electronically Signed   By: Donavan Foil M.D.   On: 12/18/2015 01:49   Ct Cervical Spine Wo Contrast  Result Date: 12/18/2015 CLINICAL DATA:  Fall from chair with right-sided headache EXAM: CT HEAD WITHOUT CONTRAST CT CERVICAL SPINE WITHOUT CONTRAST TECHNIQUE: Multidetector CT imaging of the head and cervical spine was performed following the standard protocol without intravenous contrast. Multiplanar CT image reconstructions of the cervical spine were also generated. COMPARISON:  11/04/2014, 10/25/2010 FINDINGS: CT HEAD FINDINGS Brain: No evidence of acute infarction, hemorrhage, hydrocephalus, extra-axial collection or mass lesion/mass effect. Vascular: No hyperdense vessel or unexpected calcification. Skull: Mastoid air cells are clear.  No skull fracture identified. Sinuses/Orbits: Paranasal sinuses clear.  Orbits grossly intact. Other: Right frontal scalp hematoma. CT CERVICAL SPINE FINDINGS Alignment: Mild exaggeration of cervical lordosis. Minimal retrolisthesis of C3 on C4, C4 on C5 and C5 on C6. Skull base and vertebrae: Craniovertebral junction is intact. Vertebral bodies demonstrate normal stature. Soft tissues and spinal canal:  Prevertebral soft tissue thickness is normal. No bony canal compromise. Disc levels: Moderate disc space narrowing at C4-C5 with vacuum disc, mild narrowing at C5-C6 and moderate narrowing at C6-C7 with vacuum discs. Anterior osteophytes between C3 and C7. Multilevel bilateral facet disease with right foraminal narrowing at C3-C4, C4-C5, and C5-C6. Upper chest: Bilateral emphysematous disease within the lung apices with probable scarring at the left lung apex. 1.4 cm hypodense nodule in the right lobe of the thyroid gland, similar compared to previous CT. Other: None IMPRESSION: 1. No CT evidence for acute intracranial abnormality. Right frontal scalp hematoma. 2. Degenerative changes of the cervical spine. No acute fracture or malalignment. 3. Bilateral emphysematous disease within the lung apices Electronically Signed   By: Donavan Foil M.D.   On: 12/18/2015 01:49   Dg Shoulder Left  Result Date: 12/18/2015 CLINICAL DATA:  Pain after a fall. EXAM: LEFT SHOULDER - 2+ VIEW COMPARISON:  Chest 12/15/2015 FINDINGS: No evidence of acute fracture or dislocation of the left shoulder. There is a cortical irregularity with slight deformity demonstrated at the medial aspect of the left clavicle which was not definitely present previously. I believe this likely represents a nondisplaced fracture. There also appears to be an acute nondisplaced fracture of the left anterior third rib. No destructive or expansile bone lesions. Diffuse bone demineralization. IMPRESSION: Probable acute nondisplaced fractures of the medial left clavicle and anterior left third rib. Electronically Signed   By: Lucienne Capers M.D.   On: 12/18/2015 01:18   Dg Foot Complete Left  Result Date: 12/18/2015 CLINICAL DATA:  Left foot pain after a fall. EXAM: LEFT FOOT - COMPLETE 3+ VIEW COMPARISON:  Left ankle 10/26/2010 FINDINGS: Diffuse bone demineralization. Degenerative changes in the first metatarsal-phalangeal joint. Old accessory ossicle  adjacent to the cuboidal bone. Mild dorsal soft tissue swelling over the left foot. No acute fracture or dislocation is demonstrated. Degenerative changes in the tibiotalar joint and in the intertarsal joints. IMPRESSION: Mild soft tissue swelling.  No acute bony abnormalities. Electronically Signed   By: Lucienne Capers M.D.   On: 12/18/2015 01:13   Dg Hips Bilat W Or Wo  Pelvis 3-4 Views  Result Date: 12/18/2015 CLINICAL DATA:  Bilateral hip pain after a fall. EXAM: DG HIP (WITH OR WITHOUT PELVIS) 3-4V BILAT COMPARISON:  None. FINDINGS: Diffuse bone demineralization. Degenerative changes in the lower lumbar spine. No evidence of acute fracture or dislocation in the pelvis, right hip, or left hip. No focal bone lesion or bone destruction. Soft tissues are unremarkable. IMPRESSION: No acute bony abnormalities. Electronically Signed   By: Lucienne Capers M.D.   On: 12/18/2015 01:11    Procedures Procedures (including critical care time)  Medications Ordered in ED Medications - No data to display   Initial Impression / Assessment and Plan / ED Course  I have reviewed the triage vital signs and the nursing notes.  Pertinent labs & imaging results that were available during my care of the patient were reviewed by me and considered in my medical decision making (see chart for details).  Clinical Course   Patient brought to the emergency department after a fall. Patient brought from skilled nursing facility after falling out of bed. Patient has had frequent falls recently. She has multiple areas of bruises that are consistent with old injuries. She did have a large contusion on the right side of her forehead. CT had and cervical spine performed. No acute abnormality noted. Patient initially denied any pain, but then began to complain of pain in multiple areas. It's not clear if these are chronic in nature. Multiple x-rays were obtained in response to this. She has an old right clavicle fracture from  previous fall, no new injuries are noted.  Patient was extremely groggy at arrival. She was given her nighttime meds before bedtime which included sleeping pill and pain medication. She was easily arousable to voice and completely oriented. The remainder of her workup including blood work and urine were unremarkable. Patient is felt to be adequately worked up and safe for discharge back to skilled nursing facility.  Final Clinical Impressions(s) / ED Diagnoses   Final diagnoses:  Fall  Fall, initial encounter  Contusion of face, initial encounter    New Prescriptions New Prescriptions   No medications on file  I personally performed the services described in this documentation, which was scribed in my presence. The recorded information has been reviewed and is accurate.    Orpah Greek, MD 12/18/15 [phone]

## 2015-12-18 ENCOUNTER — Emergency Department (HOSPITAL_COMMUNITY): Payer: Medicare Other

## 2015-12-18 DIAGNOSIS — S0083XA Contusion of other part of head, initial encounter: Secondary | ICD-10-CM | POA: Diagnosis not present

## 2015-12-18 LAB — CBC WITH DIFFERENTIAL/PLATELET
Basophils Absolute: 0 10*3/uL (ref 0.0–0.1)
Basophils Relative: 0 %
Eosinophils Absolute: 0.1 10*3/uL (ref 0.0–0.7)
Eosinophils Relative: 1 %
HCT: 36.2 % (ref 36.0–46.0)
HEMOGLOBIN: 11.1 g/dL — AB (ref 12.0–15.0)
LYMPHS ABS: 2.2 10*3/uL (ref 0.7–4.0)
LYMPHS PCT: 23 %
MCH: 28.4 pg (ref 26.0–34.0)
MCHC: 30.7 g/dL (ref 30.0–36.0)
MCV: 92.6 fL (ref 78.0–100.0)
Monocytes Absolute: 1.1 10*3/uL — ABNORMAL HIGH (ref 0.1–1.0)
Monocytes Relative: 12 %
NEUTROS ABS: 5.8 10*3/uL (ref 1.7–7.7)
Neutrophils Relative %: 64 %
Platelets: 336 10*3/uL (ref 150–400)
RBC: 3.91 MIL/uL (ref 3.87–5.11)
RDW: 13.3 % (ref 11.5–15.5)
WBC: 9.2 10*3/uL (ref 4.0–10.5)

## 2015-12-18 LAB — COMPREHENSIVE METABOLIC PANEL
ALBUMIN: 3.4 g/dL — AB (ref 3.5–5.0)
ALT: 25 U/L (ref 14–54)
ANION GAP: 10 (ref 5–15)
AST: 27 U/L (ref 15–41)
Alkaline Phosphatase: 71 U/L (ref 38–126)
BILIRUBIN TOTAL: 0.3 mg/dL (ref 0.3–1.2)
BUN: 7 mg/dL (ref 6–20)
CHLORIDE: 95 mmol/L — AB (ref 101–111)
CO2: 33 mmol/L — ABNORMAL HIGH (ref 22–32)
Calcium: 8.4 mg/dL — ABNORMAL LOW (ref 8.9–10.3)
Creatinine, Ser: 0.42 mg/dL — ABNORMAL LOW (ref 0.44–1.00)
GFR calc Af Amer: >60 mL/min (ref >60)
Glucose, Bld: 96 mg/dL (ref 65–99)
POTASSIUM: 3.9 mmol/L (ref 3.5–5.1)
Sodium: 138 mmol/L (ref 135–145)
TOTAL PROTEIN: 5.8 g/dL — AB (ref 6.5–8.1)

## 2015-12-18 LAB — RAPID URINE DRUG SCREEN, HOSP PERFORMED
Amphetamines: NOT DETECTED
BARBITURATES: NOT DETECTED
BENZODIAZEPINES: NOT DETECTED
Cocaine: NOT DETECTED
Opiates: POSITIVE — AB
Tetrahydrocannabinol: NOT DETECTED

## 2015-12-18 LAB — URINALYSIS, ROUTINE W REFLEX MICROSCOPIC
BILIRUBIN URINE: NEGATIVE
GLUCOSE, UA: NEGATIVE mg/dL
HGB URINE DIPSTICK: NEGATIVE
KETONES UR: NEGATIVE mg/dL
NITRITE: NEGATIVE
PH: 6.5 (ref 5.0–8.0)
Protein, ur: NEGATIVE mg/dL
Specific Gravity, Urine: 1.003 — ABNORMAL LOW (ref 1.005–1.030)

## 2015-12-18 LAB — URINE MICROSCOPIC-ADD ON: RBC / HPF: NONE SEEN RBC/hpf (ref 0–5)

## 2015-12-18 LAB — I-STAT CG4 LACTIC ACID, ED: LACTIC ACID, VENOUS: 0.97 mmol/L (ref 0.5–1.9)

## 2015-12-18 LAB — I-STAT TROPONIN, ED: TROPONIN I, POC: 0 ng/mL (ref 0.00–0.08)

## 2015-12-22 ENCOUNTER — Ambulatory Visit
Admission: RE | Admit: 2015-12-22 | Discharge: 2015-12-22 | Disposition: A | Discharge status: Home / Self Care | Payer: Medicare Other | Source: Ambulatory Visit | Attending: Internal Medicine | Admitting: Internal Medicine

## 2015-12-22 DIAGNOSIS — M6281 Muscle weakness (generalized): Secondary | ICD-10-CM

## 2016-01-21 ENCOUNTER — Other Ambulatory Visit: Payer: Self-pay | Admitting: Orthopedic Surgery

## 2016-01-21 DIAGNOSIS — S42034D Nondisplaced fracture of lateral end of right clavicle, subsequent encounter for fracture with routine healing: Secondary | ICD-10-CM

## 2016-02-01 ENCOUNTER — Other Ambulatory Visit: Payer: Self-pay | Admitting: Orthopedic Surgery

## 2016-02-01 DIAGNOSIS — S42034D Nondisplaced fracture of lateral end of right clavicle, subsequent encounter for fracture with routine healing: Secondary | ICD-10-CM

## 2016-02-02 ENCOUNTER — Other Ambulatory Visit: Payer: Self-pay

## 2016-02-12 ENCOUNTER — Telehealth: Payer: Self-pay | Admitting: Internal Medicine

## 2016-02-12 NOTE — Telephone Encounter (Signed)
I am completing paperwork today from [facility] that requires my signature:  Received 02/10/16: approval for Duonebs refill  I have not seen this patient since February and was wondering if she would be available to be seen in the [facility].   Thank you.

## 2016-03-10 ENCOUNTER — Encounter: Payer: Self-pay | Admitting: Neurology

## 2016-03-10 ENCOUNTER — Ambulatory Visit (INDEPENDENT_AMBULATORY_CARE_PROVIDER_SITE_OTHER): Payer: Medicare Other | Admitting: Neurology

## 2016-03-10 VITALS — BP 122/68 | HR 93 | Ht 61.0 in | Wt 92.1 lb

## 2016-03-10 DIAGNOSIS — M21332 Wrist drop, left wrist: Secondary | ICD-10-CM

## 2016-03-10 NOTE — Progress Notes (Signed)
NEUROLOGY CONSULTATION NOTE  [name] MRN: [id number] DOB: May 04, 1950  Referring provider: Dr. Vickki Hearing Primary care provider: Dr. Charlott Rakes  Reason for consult:  Left hand weakness  Dear Dr Delilah Shan:  Thank you for your kind referral of [name] for consultation of the above symptoms. Although her history is well known to you, please allow me to reiterate it for the purpose of our medical record. Records and images were personally reviewed where available.  HISTORY OF PRESENT ILLNESS: This is a pleasant 65 year old right-handed woman with a history of COPD on chronic O2, anemia, arthritis, bipolar disorder, chronic pain, presenting for evaluation of left hand weakness that started around 3-1/2 months ago. She woke up one morning and her hand was in a fist, unable to move it. There was no associated numbness or tingling. She denies any proximal weakness, she was able to lift her arm up and flex at the elbow. Leg and face were unaffected. Right side was fine. She has been going to PT and doing home exercises, with some improvement in hand weakness, she is now able to extend her fingers and move them, but they still continue to be weak and now occasionally tingle. She presents today because "I have not been able to get answers as to why it happened." She denies any left arm or neck pain. She denies any heavy lifting or falls prior to the left hand weakness. She fell in the beginning of October and fractured her right clavicle last 12/17/15 and has been seeing Ortho, who referred her to our office for the left hand weakness. She denies any further falls after her medications were adjusted, she reports she kept losing her balance. She has been using a walker for many years, denies any paresthesias in her legs. She has left hip pain and sciatica affecting the right leg. No bowel/bladder dysfunction. She denies any headaches, dizziness, diplopia, dysarthria/dysphagia.   PAST MEDICAL  HISTORY: Past Medical History:  Diagnosis Date  . Anemia   . Anxiety   . Arthritis    "all in my body, primarily left hip" (11/11/2014)  . Bipolar disorder ([place])   . Chronic lower back pain   . Chronic pain syndrome    follows at [facility]  . COPD (chronic obstructive pulmonary disease) (HCC)    oxygen dependent (3L home continuous)  . DEFICIENCY, VITAMIN D NOS 01/03/2007  . Depression   . Duodenal ulcer, chronic   . Elevated liver function tests   . History of blood transfusion 03/2014; 10/2014   "low HgB" (11/11/2014)  . Hyperthyroidism    "borderline"  . On home oxygen therapy    "3L; 24/7" (8/30//2016)  . OSTEOPOROSIS 06/17/2009   DEXA 05/2009 : L femur -2.9; R femur -2.5. Alendronate on med list but not taking. Needs addressed ASAP as h/o fractures.    . Pneumonia X 1?  . Sciatic pain   . Shortness of breath    "all the time" (02/27/2013)  . Substance abuse     narcotics, alcohol, tobacco  . Suicidal ideation 2007    attempted overdose 2012  Dr. Tivis Ringer report    PAST SURGICAL HISTORY: Past Surgical History:  Procedure Laterality Date  . ANKLE DEBRIDEMENT Left 10/2010  . AUGMENTATION MAMMAPLASTY  ~ 2007  . CESAREAN SECTION  1974; 1979  . ESOPHAGOGASTRODUODENOSCOPY N/A 11/07/2014   Procedure: ESOPHAGOGASTRODUODENOSCOPY (EGD);  Surgeon: Irene Shipper, MD;  Location[facility] ENDOSCOPY;  Service: Endoscopy;  Laterality:  N/A;  . FRACTURE SURGERY    . ORIF ANKLE FRACTURE Left 10/2010  . TUBAL LIGATION  1979    MEDICATIONS: Current Outpatient Prescriptions on File Prior to Visit  Medication Sig Dispense Refill  . alendronate (FOSAMAX) 70 MG tablet Take 1 tablet (70 mg total) by mouth once a week. Take with a full glass of water on an empty stomach. 4 tablet 0  . Cholecalciferol 1000 units TBDP Take 1,000 mg by mouth daily.    . fluticasone (FLONASE) 50 MCG/ACT nasal spray Place 2 sprays into both nostrils daily.    . Fluticasone-Salmeterol (ADVAIR DISKUS) 250-50  MCG/DOSE AEPB INHALE 1 PUFF INTO THE LUNGS 2 TIMES DAILY 5 each 6  . gabapentin (NEURONTIN) 300 MG capsule Take 300 mg by mouth at bedtime.     Marland Kitchen guaiFENesin-dextromethorphan (ROBITUSSIN DM) 100-10 MG/5ML syrup Take 5 mLs by mouth every 4 (four) hours as needed for cough. 118 mL 0  . HYDROcodone-acetaminophen (NORCO/VICODIN) 5-325 MG tablet Take 1 tablet by mouth every 6 (six) hours as needed for moderate pain or severe pain. 20 tablet 0  . ipratropium-albuterol (DUONEB) 0.5-2.5 (3) MG/3ML SOLN 4 times a day (breakfast, lunch, dinner, bedtime).Can take 2 additional treatments if needed on bad days. DX J43.8 (Patient taking differently: Take 3 mLs by mouth 4 (four) times daily. ) 360 mL 3  . lamoTRIgine (LAMICTAL) 100 MG tablet TAKE ONE TABLET EVERY DAY 30 tablet 2  . levocetirizine (XYZAL) 5 MG tablet Take 5 mg by mouth at bedtime.     . Melatonin 10 MG TABS Take 1 tablet by mouth at bedtime as needed. (Patient taking differently: Take 1 tablet by mouth at bedtime. ) 90 tablet 3  . meloxicam (MOBIC) 7.5 MG tablet Take 7.5 mg by mouth daily.    Marland Kitchen OLANZapine (ZYPREXA) 2.5 MG tablet Take 2.5 mg by mouth at bedtime.    . OXYGEN Inhale 3 L into the lungs continuous.    . pantoprazole (PROTONIX) 40 MG tablet Take 1 tablet (40 mg total) by mouth 2 (two) times daily. 60 tablet 2  . polyethylene glycol powder (GLYCOLAX/MIRALAX) powder TAKE 17G DISSOLVED IN 8OZ WATER DAILY ASNEEDED FOR MODERATE CONSTIPATION 527 g 2  . PROAIR HFA 108 (90 BASE) MCG/ACT inhaler INHALE ONE PUFF BY MOUTH EVERY 2 TO 4 HOURS AS NEEDED FOR WHEEZING 8.5 g 12  . senna (SENOKOT) 8.6 MG TABS tablet Take 2 tablets (17.2 mg total) by mouth 3 (three) times daily as needed for mild constipation. 120 each 0  . Spacer/Aero-Holding Chambers (AEROCHAMBER Z-STAT PLUS/MEDIUM) inhaler Use with rescue inhaler 1 each 2  . venlafaxine XR (EFFEXOR-XR) 37.5 MG 24 hr capsule Take 37.5 mg by mouth every evening.    . white petrolatum (VASELINE) GEL Apply  1 application topically daily. To both feet for dry skin    . zolpidem (AMBIEN) 5 MG tablet Take 5 mg by mouth at bedtime as needed for sleep.     No current facility-administered medications on file prior to visit.     ALLERGIES: Allergies  Allergen Reactions  . Banana Shortness Of Breath and Nausea And Vomiting  . Lyrica [Pregabalin] Other (See Comments)    "Disorients me"  . Pollen Extract Other (See Comments)    Seasonal     FAMILY HISTORY: Family History  Problem Relation Age of Onset  . Myelodysplastic syndrome Father     Died from myelofibrosis though diagnosed post-mortem  . Stroke Neg Hx   . Cancer Neg Hx  SOCIAL HISTORY: Social History   Social History  . Marital status: Divorced    Spouse name: N/A  . Number of children: 2  . Years of education: N/A   Occupational History  . retired Unemployed   Social History Main Topics  . Smoking status: Current Every Day Smoker    Packs/day: 0.20    Years: 45.00    Types: Cigarettes  . Smokeless tobacco: Never Used     Comment: 11/11/2014 ""smoking 1 cigarettes/day; that's down from 2 ppd"  . Alcohol use 0.0 oz/week     Comment: 11/11/2014 "nothing in 7 years; never had a problem w/it"  . Drug use: No  . Sexual activity: No   Other Topics Concern  . 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    2 kids (daughter in [place] and son in [place])    Still smoking <1ppd used to smoke 2 ppd long term smoker   Able to do ADLs    Never had colonoscopy as of 06/2014     REVIEW OF SYSTEMS: Constitutional: No fevers, chills, or sweats, no generalized fatigue, change in appetite Eyes: No visual changes, double vision, eye pain Ear, nose and throat: No hearing loss, ear pain, nasal congestion, sore throat Cardiovascular: No chest pain, palpitations Respiratory:  No shortness of breath at rest or with exertion, wheezes GastrointestinaI: No nausea, vomiting, diarrhea, abdominal  pain, fecal incontinence Genitourinary:  No dysuria, urinary retention or frequency Musculoskeletal:  No neck pain, +back pain Integumentary: No rash, pruritus, skin lesions Neurological: as above Psychiatric: No depression, insomnia, anxiety Endocrine: No palpitations, fatigue, diaphoresis, mood swings, change in appetite, change in weight, increased thirst Hematologic/Lymphatic:  No anemia, purpura, petechiae. Allergic/Immunologic: no itchy/runny eyes, nasal congestion, recent allergic reactions, rashes  PHYSICAL EXAM: Vitals:   03/10/16 1052  BP: 122/68  Pulse: 93   General: No acute distress, on O2 nasal cannula Head:  Normocephalic/atraumatic Eyes: Fundoscopic exam shows bilateral sharp discs, no vessel changes, exudates, or hemorrhages Neck: supple, no paraspinal tenderness, full range of motion Back: No paraspinal tenderness Heart: regular rate and rhythm Lungs: Clear to auscultation bilaterally. Vascular: No carotid bruits. Skin/Extremities: No rash, no edema Neurological Exam: Mental status: alert and oriented to person, place, and time, no dysarthria or aphasia, Fund of knowledge is appropriate.  Recent and remote memory are intact.  Attention and concentration are normal.    Able to name objects and repeat phrases. Cranial nerves: CN I: not tested CN II: pupils equal, round and reactive to light, visual fields intact, fundi unremarkable. CN III, IV, VI:  full range of motion, no nystagmus, no ptosis CN V: facial sensation intact CN VII: upper and lower face symmetric CN VIII: hearing intact to finger rub CN IX, X: gag intact, uvula midline CN XI: sternocleidomastoid and trapezius muscles intact CN XII: tongue midline Bulk & Tone: normal, no fasciculations. Motor: 3+/5 left wrist extension, finger extension. Otherwise 5/5 throughout with no pronator drift (s/p right clavicle fracture but strength full) Sensation: intact to light touch, cold, pin, vibration and joint  position sense.  No extinction to double simultaneous stimulation.  Romberg test negative Deep Tendon Reflexes: +1 throughout, no ankle clonus Plantar responses: downgoing bilaterally Cerebellar: no incoordination on finger to nose testing Gait: slow and cautious, no ataxia Tremor: none  IMPRESSION: This is a pleasant 65 year old right-handed woman with a history of COPD on chronic O2, anemia, arthritis, bipolar disorder,  chronic pain, with left wrist drop that started around 3-1/2 months ago. Her exam shows weakness of wrist and finger extension, no sensory deficits. She will be schedule for an EMG/NCV of the left UE, radial nerve protocol. We discussed that she is already doing physical therapy, and has noticed gradual improvement, continue HEP. She will follow-up in 3 months and knows to call for any changes.   Thank you for allowing me to participate in the care of this patient. Please do not hesitate to call for any questions or concerns.   Ellouise Newer, M.D.  CC: Dr. Delilah Shan, Dr. Posey Pronto

## 2016-03-10 NOTE — Patient Instructions (Signed)
1. Schedule EMG/NCV of the left UE with Dr. Posey Pronto 2. Continue with home PT exercises 3. Follow-up in 3 months, call for any changes

## 2016-03-22 ENCOUNTER — Encounter: Payer: Self-pay | Admitting: Neurology

## 2016-03-24 ENCOUNTER — Ambulatory Visit (INDEPENDENT_AMBULATORY_CARE_PROVIDER_SITE_OTHER): Payer: Medicare Other | Admitting: Neurology

## 2016-03-24 DIAGNOSIS — M21332 Wrist drop, left wrist: Secondary | ICD-10-CM

## 2016-03-24 DIAGNOSIS — G5632 Lesion of radial nerve, left upper limb: Secondary | ICD-10-CM

## 2016-03-24 NOTE — Procedures (Signed)
[facility]  [address], [address]  [place], [place] [address] Tel: [phone] Fax:  [phone] Test Date:  03/24/2016  Patient: [name] DOB: [birth year] Physician: Narda Amber, DO  Sex: Female Height: 5\' 1"  Ref Phys: Ellouise Newer, M.D.  ID#: [id number] Temp: 33.6C Technician: Jerilynn Mages. Dean   Patient Complaints: This is a 66 year old female referred for evaluation of left wrist drop.   NCV & EMG Findings: Extensive electrodiagnostic testing of the left upper extremity and additional studies of the right shows:  1. Left median, ulnar, and bilateral radial sensory responses are within normal limits. 2. Left median and ulnar motor responses are within normal limits. Left radial motor response shows reduced amplitude (4.8 mV) and conduction block below the spiral groove.  Right radial motor responses within normal limits.  3. Chronic motor axon loss changes are seen affecting the left extensor indicis proprius, extensor digitorum communis, extensor carpi radialis-long head, and brachioradialis muscles. There is evidence of active denervation isolated to the brachioradialis muscle.  The triceps muscle shows normal motor unit configuration and recruitment pattern.   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   ___________________________ Narda Amber, DO    Nerve Conduction Studies Anti Sensory Summary Table   Site NR Peak (ms) Norm Peak (ms) P-T Amp (V) Norm P-T Amp  Left Median Anti Sensory (2nd Digit)  Wrist    3.5 <3.8 25.6 >10  Left Radial Anti Sensory (Base 1st Digit)  Wrist    2.3 <2.8 30.6 >10  Right Radial Anti Sensory (Base 1st Digit)  Wrist    2.0 <2.8 29.7 >10  Left Ulnar Anti Sensory (5th Digit)  Wrist    2.8 <3.2 17.2 >5   Motor Summary Table   Site NR Onset (ms) Norm Onset (ms) O-P Amp (mV) Norm O-P Amp Site1 Site2 Delta-0 (ms) Dist (cm) Vel (m/s)  Norm Vel (m/s)  Left Median Motor (Abd Poll Brev)  Wrist    3.0 <4.0 6.9 >5 Elbow Wrist 3.7 21.0 57 >50  Elbow    6.7  6.9         Left Radial Motor (Ext Ind Prop)  7cm    2.7 <3.1 4.8 >5 SprialGrv 7cm 3.7 19.0 51 >50  SprialGrv    6.4  2.3  UppGrv SprialGrv 0.5 3.0 60   UppGrv    6.9  2.3         Right Radial Motor (Ext Ind Prop)  33C  7cm    2.7 <3.1 6.4 >5        Left Ulnar Motor (Abd Dig Minimi)  Wrist    2.9 <3.1 7.9 >7 B Elbow Wrist 2.8 16.0 57 >50  B Elbow    5.7  6.8  A Elbow B Elbow 1.6 10.0 63 >50  A Elbow    7.3  6.3          EMG   Side Muscle Ins Act Fibs Psw Fasc Number Recrt Dur Dur. Amp Amp. Poly Poly. Comment  Left 1stDorInt Nml Nml Nml Nml Nml Nml Nml Nml Nml Nml Nml Nml N/A  Left ExtCarRadLong Nml Nml Nml Nml 2- Rapid All 1+ All 1+ All 1+ N/A  Left BrachioRad Nml 1+ Nml Nml 3- Rapid All 1+ Nml Nml All 1+ N/A  Left Ext Digitorum Nml Nml Nml Nml 2- Rapid Most 1+ Many 1+ Many 1+ N/A  Left Ext Indicis Nml Nml Nml Nml 2-  Rapid Most 1+ Many 1+ Many 1+ N/A  Left PronatorTeres Nml Nml Nml Nml Nml Nml Nml Nml Nml Nml Nml Nml N/A  Left Biceps Nml Nml Nml Nml Nml Nml Nml Nml Nml Nml Nml Nml N/A  Left Triceps Nml Nml Nml Nml Nml Nml Nml Nml Nml Nml Nml Nml N/A  Left Deltoid Nml Nml Nml Nml Nml Nml Nml Nml Nml Nml Nml Nml N/A      Waveforms:

## 2016-04-04 ENCOUNTER — Telehealth: Payer: Self-pay | Admitting: Neurology

## 2016-04-04 NOTE — Telephone Encounter (Signed)
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 Most likely positional. She reports she continues to do better with PT and OT. I discussed doing an xray of the humerus if there is a possibility that she had fallen and fractured it, potentially causing similar symptoms. She states there was no fall that affected her left side, when she fell it was her right side that was fractured and injured. She states symptoms are getting better, hold off on xray. All her questions were answered.

## 2016-05-19 ENCOUNTER — Telehealth: Payer: Self-pay | Admitting: Internal Medicine

## 2016-05-19 NOTE — Telephone Encounter (Signed)
APT. REMINDER CALL, LMTCB °

## 2016-05-20 ENCOUNTER — Encounter: Payer: Self-pay | Admitting: Internal Medicine

## 2016-05-24 ENCOUNTER — Ambulatory Visit: Payer: Self-pay

## 2016-05-31 ENCOUNTER — Ambulatory Visit: Payer: Self-pay | Admitting: Neurology

## 2016-05-31 ENCOUNTER — Encounter: Payer: Self-pay | Admitting: Neurology

## 2016-06-01 ENCOUNTER — Ambulatory Visit: Payer: Self-pay

## 2016-06-08 ENCOUNTER — Telehealth: Payer: Self-pay | Admitting: Internal Medicine

## 2016-06-08 NOTE — Telephone Encounter (Signed)
APT. REMINDER CALL, LMTCB °

## 2016-06-09 ENCOUNTER — Inpatient Hospital Stay (HOSPITAL_COMMUNITY)
Admission: EM | Admit: 2016-06-09 | Discharge: 2016-06-14 | DRG: 480 | Disposition: A | Discharge status: Skilled Nursing Facility | Payer: Medicare Other | Attending: Student in an Organized Health Care Education/Training Program | Admitting: Student in an Organized Health Care Education/Training Program

## 2016-06-09 ENCOUNTER — Emergency Department (HOSPITAL_COMMUNITY): Payer: Medicare Other

## 2016-06-09 ENCOUNTER — Encounter (HOSPITAL_COMMUNITY): Payer: Self-pay | Admitting: Emergency Medicine

## 2016-06-09 ENCOUNTER — Encounter: Payer: Self-pay | Admitting: Internal Medicine

## 2016-06-09 ENCOUNTER — Ambulatory Visit: Payer: Self-pay

## 2016-06-09 DIAGNOSIS — S42031K Displaced fracture of lateral end of right clavicle, subsequent encounter for fracture with nonunion: Secondary | ICD-10-CM | POA: Diagnosis not present

## 2016-06-09 DIAGNOSIS — Z9114 Patient's other noncompliance with medication regimen: Secondary | ICD-10-CM

## 2016-06-09 DIAGNOSIS — Z9981 Dependence on supplemental oxygen: Secondary | ICD-10-CM | POA: Diagnosis not present

## 2016-06-09 DIAGNOSIS — F17210 Nicotine dependence, cigarettes, uncomplicated: Secondary | ICD-10-CM | POA: Diagnosis present

## 2016-06-09 DIAGNOSIS — S81811A Laceration without foreign body, right lower leg, initial encounter: Secondary | ICD-10-CM | POA: Diagnosis not present

## 2016-06-09 DIAGNOSIS — Z66 Do not resuscitate: Secondary | ICD-10-CM | POA: Diagnosis present

## 2016-06-09 DIAGNOSIS — Z9181 History of falling: Secondary | ICD-10-CM

## 2016-06-09 DIAGNOSIS — W19XXXA Unspecified fall, initial encounter: Secondary | ICD-10-CM | POA: Diagnosis present

## 2016-06-09 DIAGNOSIS — R52 Pain, unspecified: Secondary | ICD-10-CM

## 2016-06-09 DIAGNOSIS — M25519 Pain in unspecified shoulder: Secondary | ICD-10-CM

## 2016-06-09 DIAGNOSIS — M25552 Pain in left hip: Secondary | ICD-10-CM | POA: Diagnosis present

## 2016-06-09 DIAGNOSIS — S12201A Unspecified nondisplaced fracture of third cervical vertebra, initial encounter for closed fracture: Secondary | ICD-10-CM | POA: Diagnosis present

## 2016-06-09 DIAGNOSIS — K5900 Constipation, unspecified: Secondary | ICD-10-CM | POA: Diagnosis present

## 2016-06-09 DIAGNOSIS — S72142A Displaced intertrochanteric fracture of left femur, initial encounter for closed fracture: Principal | ICD-10-CM | POA: Diagnosis present

## 2016-06-09 DIAGNOSIS — K219 Gastro-esophageal reflux disease without esophagitis: Secondary | ICD-10-CM | POA: Diagnosis present

## 2016-06-09 DIAGNOSIS — S12200A Unspecified displaced fracture of third cervical vertebra, initial encounter for closed fracture: Secondary | ICD-10-CM | POA: Diagnosis not present

## 2016-06-09 DIAGNOSIS — M810 Age-related osteoporosis without current pathological fracture: Secondary | ICD-10-CM | POA: Diagnosis present

## 2016-06-09 DIAGNOSIS — G8929 Other chronic pain: Secondary | ICD-10-CM | POA: Diagnosis present

## 2016-06-09 DIAGNOSIS — X58XXXD Exposure to other specified factors, subsequent encounter: Secondary | ICD-10-CM | POA: Diagnosis not present

## 2016-06-09 DIAGNOSIS — Y92039 Unspecified place in apartment as the place of occurrence of the external cause: Secondary | ICD-10-CM | POA: Diagnosis not present

## 2016-06-09 DIAGNOSIS — E870 Hyperosmolality and hypernatremia: Secondary | ICD-10-CM | POA: Diagnosis present

## 2016-06-09 DIAGNOSIS — E041 Nontoxic single thyroid nodule: Secondary | ICD-10-CM

## 2016-06-09 DIAGNOSIS — W19XXXD Unspecified fall, subsequent encounter: Secondary | ICD-10-CM | POA: Diagnosis not present

## 2016-06-09 DIAGNOSIS — S72009A Fracture of unspecified part of neck of unspecified femur, initial encounter for closed fracture: Secondary | ICD-10-CM | POA: Diagnosis present

## 2016-06-09 DIAGNOSIS — E43 Unspecified severe protein-calorie malnutrition: Secondary | ICD-10-CM | POA: Diagnosis present

## 2016-06-09 DIAGNOSIS — J449 Chronic obstructive pulmonary disease, unspecified: Secondary | ICD-10-CM | POA: Diagnosis not present

## 2016-06-09 DIAGNOSIS — Z79899 Other long term (current) drug therapy: Secondary | ICD-10-CM

## 2016-06-09 DIAGNOSIS — F319 Bipolar disorder, unspecified: Secondary | ICD-10-CM | POA: Diagnosis present

## 2016-06-09 DIAGNOSIS — M419 Scoliosis, unspecified: Secondary | ICD-10-CM | POA: Diagnosis present

## 2016-06-09 DIAGNOSIS — F419 Anxiety disorder, unspecified: Secondary | ICD-10-CM | POA: Diagnosis present

## 2016-06-09 DIAGNOSIS — J9611 Chronic respiratory failure with hypoxia: Secondary | ICD-10-CM | POA: Diagnosis present

## 2016-06-09 DIAGNOSIS — E0590 Thyrotoxicosis, unspecified without thyrotoxic crisis or storm: Secondary | ICD-10-CM | POA: Diagnosis present

## 2016-06-09 DIAGNOSIS — E860 Dehydration: Secondary | ICD-10-CM | POA: Diagnosis present

## 2016-06-09 DIAGNOSIS — Z419 Encounter for procedure for purposes other than remedying health state, unspecified: Secondary | ICD-10-CM

## 2016-06-09 DIAGNOSIS — Y92009 Unspecified place in unspecified non-institutional (private) residence as the place of occurrence of the external cause: Secondary | ICD-10-CM | POA: Diagnosis not present

## 2016-06-09 DIAGNOSIS — F3130 Bipolar disorder, current episode depressed, mild or moderate severity, unspecified: Secondary | ICD-10-CM | POA: Diagnosis present

## 2016-06-09 DIAGNOSIS — J439 Emphysema, unspecified: Secondary | ICD-10-CM | POA: Diagnosis present

## 2016-06-09 DIAGNOSIS — S72142D Displaced intertrochanteric fracture of left femur, subsequent encounter for closed fracture with routine healing: Secondary | ICD-10-CM | POA: Diagnosis not present

## 2016-06-09 DIAGNOSIS — Z681 Body mass index (BMI) 19 or less, adult: Secondary | ICD-10-CM | POA: Diagnosis not present

## 2016-06-09 DIAGNOSIS — R821 Myoglobinuria: Secondary | ICD-10-CM | POA: Diagnosis present

## 2016-06-09 DIAGNOSIS — I493 Ventricular premature depolarization: Secondary | ICD-10-CM | POA: Diagnosis present

## 2016-06-09 DIAGNOSIS — R296 Repeated falls: Secondary | ICD-10-CM | POA: Diagnosis present

## 2016-06-09 DIAGNOSIS — M6282 Rhabdomyolysis: Secondary | ICD-10-CM | POA: Diagnosis present

## 2016-06-09 DIAGNOSIS — M79605 Pain in left leg: Secondary | ICD-10-CM | POA: Diagnosis not present

## 2016-06-09 DIAGNOSIS — S12200D Unspecified displaced fracture of third cervical vertebra, subsequent encounter for fracture with routine healing: Secondary | ICD-10-CM | POA: Diagnosis not present

## 2016-06-09 DIAGNOSIS — S72002A Fracture of unspecified part of neck of left femur, initial encounter for closed fracture: Secondary | ICD-10-CM

## 2016-06-09 DIAGNOSIS — M5442 Lumbago with sciatica, left side: Secondary | ICD-10-CM | POA: Diagnosis not present

## 2016-06-09 DIAGNOSIS — D72829 Elevated white blood cell count, unspecified: Secondary | ICD-10-CM | POA: Diagnosis not present

## 2016-06-09 DIAGNOSIS — E46 Unspecified protein-calorie malnutrition: Secondary | ICD-10-CM | POA: Diagnosis not present

## 2016-06-09 DIAGNOSIS — M549 Dorsalgia, unspecified: Secondary | ICD-10-CM

## 2016-06-09 DIAGNOSIS — G894 Chronic pain syndrome: Secondary | ICD-10-CM | POA: Diagnosis present

## 2016-06-09 DIAGNOSIS — M80852A Other osteoporosis with current pathological fracture, left femur, initial encounter for fracture: Secondary | ICD-10-CM | POA: Diagnosis not present

## 2016-06-09 DIAGNOSIS — S72145A Nondisplaced intertrochanteric fracture of left femur, initial encounter for closed fracture: Secondary | ICD-10-CM

## 2016-06-09 DIAGNOSIS — Z79891 Long term (current) use of opiate analgesic: Secondary | ICD-10-CM | POA: Diagnosis not present

## 2016-06-09 DIAGNOSIS — R636 Underweight: Secondary | ICD-10-CM | POA: Diagnosis not present

## 2016-06-09 LAB — URINALYSIS, ROUTINE W REFLEX MICROSCOPIC
BACTERIA UA: NONE SEEN
BILIRUBIN URINE: NEGATIVE
Glucose, UA: NEGATIVE mg/dL
KETONES UR: 20 mg/dL — AB
LEUKOCYTES UA: NEGATIVE
NITRITE: NEGATIVE
PH: 5 (ref 5.0–8.0)
Protein, ur: 30 mg/dL — AB
SPECIFIC GRAVITY, URINE: 1.017 (ref 1.005–1.030)
SQUAMOUS EPITHELIAL / LPF: NONE SEEN

## 2016-06-09 LAB — CBC WITH DIFFERENTIAL/PLATELET
Basophils Absolute: 0 10*3/uL (ref 0.0–0.1)
Basophils Relative: 0 %
Eosinophils Absolute: 0 10*3/uL (ref 0.0–0.7)
Eosinophils Relative: 0 %
HEMATOCRIT: 35.3 % — AB (ref 36.0–46.0)
HEMOGLOBIN: 11 g/dL — AB (ref 12.0–15.0)
LYMPHS ABS: 0.8 10*3/uL (ref 0.7–4.0)
LYMPHS PCT: 4 %
MCH: 25.4 pg — AB (ref 26.0–34.0)
MCHC: 31.2 g/dL (ref 30.0–36.0)
MCV: 81.5 fL (ref 78.0–100.0)
MONOS PCT: 8 %
Monocytes Absolute: 1.4 10*3/uL — ABNORMAL HIGH (ref 0.1–1.0)
NEUTROS ABS: 15.8 10*3/uL — AB (ref 1.7–7.7)
NEUTROS PCT: 88 %
Platelets: 408 10*3/uL — ABNORMAL HIGH (ref 150–400)
RBC: 4.33 MIL/uL (ref 3.87–5.11)
RDW: 15.8 % — ABNORMAL HIGH (ref 11.5–15.5)
WBC: 18.1 10*3/uL — ABNORMAL HIGH (ref 4.0–10.5)

## 2016-06-09 LAB — I-STAT CHEM 8, ED
BUN: 20 mg/dL (ref 6–20)
CHLORIDE: 100 mmol/L — AB (ref 101–111)
CREATININE: 0.5 mg/dL (ref 0.44–1.00)
Calcium, Ion: 1.05 mmol/L — ABNORMAL LOW (ref 1.15–1.40)
GLUCOSE: 98 mg/dL (ref 65–99)
HCT: 35 % — ABNORMAL LOW (ref 36.0–46.0)
HEMOGLOBIN: 11.9 g/dL — AB (ref 12.0–15.0)
POTASSIUM: 3 mmol/L — AB (ref 3.5–5.1)
Sodium: 141 mmol/L (ref 135–145)
TCO2: 32 mmol/L (ref 0–100)

## 2016-06-09 LAB — COMPREHENSIVE METABOLIC PANEL
ALBUMIN: 3.6 g/dL (ref 3.5–5.0)
ALK PHOS: 83 U/L (ref 38–126)
ALT: 40 U/L (ref 14–54)
ANION GAP: 12 (ref 5–15)
AST: 113 U/L — ABNORMAL HIGH (ref 15–41)
BILIRUBIN TOTAL: 0.4 mg/dL (ref 0.3–1.2)
BUN: 19 mg/dL (ref 6–20)
CALCIUM: 9.1 mg/dL (ref 8.9–10.3)
CO2: 29 mmol/L (ref 22–32)
CREATININE: 0.53 mg/dL (ref 0.44–1.00)
Chloride: 101 mmol/L (ref 101–111)
GFR calc non Af Amer: >60 mL/min (ref >60)
GLUCOSE: 101 mg/dL — AB (ref 65–99)
Potassium: 3.2 mmol/L — ABNORMAL LOW (ref 3.5–5.1)
Sodium: 142 mmol/L (ref 135–145)
TOTAL PROTEIN: 6.5 g/dL (ref 6.5–8.1)

## 2016-06-09 LAB — RAPID URINE DRUG SCREEN, HOSP PERFORMED
Amphetamines: NOT DETECTED
Barbiturates: NOT DETECTED
Benzodiazepines: NOT DETECTED
Cocaine: NOT DETECTED
Opiates: POSITIVE — AB
Tetrahydrocannabinol: NOT DETECTED

## 2016-06-09 LAB — CK: Total CK: 3714 U/L — ABNORMAL HIGH (ref 38–234)

## 2016-06-09 MED ORDER — SODIUM CHLORIDE 0.9 % IV SOLN
INTRAVENOUS | Status: AC
  Administered 2016-06-09: via INTRAVENOUS

## 2016-06-09 MED ORDER — POTASSIUM CHLORIDE CRYS ER 20 MEQ PO TBCR
40.0000 meq | EXTENDED_RELEASE_TABLET | Freq: Once | ORAL | Status: AC
  Administered 2016-06-09: 40 meq via ORAL
  Filled 2016-06-09: qty 2

## 2016-06-09 MED ORDER — SODIUM CHLORIDE 0.9 % IV BOLUS (SEPSIS)
1000.0000 mL | Freq: Once | INTRAVENOUS | Status: AC
Start: 2016-06-09 — End: 2016-06-09
  Administered 2016-06-09: 1000 mL via INTRAVENOUS

## 2016-06-09 MED ORDER — MORPHINE SULFATE (PF) 4 MG/ML IV SOLN
2.0000 mg | Freq: Once | INTRAVENOUS | Status: AC
  Administered 2016-06-09: 2 mg via INTRAVENOUS
  Filled 2016-06-09: qty 1

## 2016-06-09 MED ORDER — MAGNESIUM OXIDE 400 (241.3 MG) MG PO TABS
800.0000 mg | ORAL_TABLET | Freq: Once | ORAL | Status: AC
  Administered 2016-06-09: 800 mg via ORAL
  Filled 2016-06-09: qty 2

## 2016-06-09 MED ORDER — KETOROLAC TROMETHAMINE 15 MG/ML IJ SOLN: 15.0000 mg | Freq: Four times a day (QID) | INTRAMUSCULAR | Status: DC | PRN

## 2016-06-09 MED ORDER — MORPHINE SULFATE (PF) 4 MG/ML IV SOLN: 1.0000 mg | INTRAVENOUS | Status: DC | PRN

## 2016-06-09 MED ORDER — MORPHINE SULFATE (PF) 4 MG/ML IV SOLN
1.0000 mg | INTRAVENOUS | Status: DC | PRN
  Administered 2016-06-10 (×2): 1 mg via INTRAVENOUS
  Filled 2016-06-09 (×2): qty 1

## 2016-06-09 MED ORDER — HYDROCODONE-ACETAMINOPHEN 5-325 MG PO TABS: 1.0000 | ORAL_TABLET | ORAL | Status: DC | PRN

## 2016-06-09 NOTE — ED Provider Notes (Signed)
[facility] DEPT Provider Note   CSN: [id number] Arrival date & time: 06/09/16  1719     History   Chief Complaint Chief Complaint  Patient presents with  . Altered Mental Status  . Fall    HPI [name] is a 66 y.o. female.  66 yo F with a chief complaint of a fall. Patient is unsure what exactly this happened. She had been unable to get up off the ground since. Eventually was found by a neighbor. EMS arrived to find her covered in feces. She is normally on 3 L of oxygen at all times and they had to increase her oxygen to 6 L. She denies recent illness. Is unsure why she fell. Complaining of pain all over her body. Is unable to localize any area of tenderness.   The history is provided by the patient.  Altered Mental Status   Associated symptoms include weakness.  Fall  Associated symptoms include headaches. Pertinent negatives include no chest pain and no shortness of breath.  Illness  This is a new problem. The current episode started yesterday. The problem occurs constantly. The problem has not changed since onset.Associated symptoms include headaches. Pertinent negatives include no chest pain and no shortness of breath. Nothing aggravates the symptoms. Nothing relieves the symptoms. She has tried nothing for the symptoms. The treatment provided no relief.    Past Medical History:  Diagnosis Date  . Anemia   . Anxiety   . Arthritis    "all in my body, primarily left hip" (11/11/2014)  . Bipolar disorder ([place])   . Chronic lower back pain   . Chronic pain syndrome    follows at [facility]  . COPD (chronic obstructive pulmonary disease) (HCC)    oxygen dependent (3L home continuous)  . DEFICIENCY, VITAMIN D NOS 01/03/2007  . Depression   . Duodenal ulcer, chronic   . Elevated liver function tests   . History of blood transfusion 03/2014; 10/2014   "low HgB" (11/11/2014)  . Hyperthyroidism    "borderline"  . On home oxygen therapy    "3L; 24/7"  (8/30//2016)  . OSTEOPOROSIS 06/17/2009   DEXA 05/2009 : L femur -2.9; R femur -2.5. Alendronate on med list but not taking. Needs addressed ASAP as h/o fractures.    . Pneumonia X 1?  . Sciatic pain   . Shortness of breath    "all the time" (02/27/2013)  . Substance abuse     narcotics, alcohol, tobacco  . Suicidal ideation 2007    attempted overdose 2012  Dr. Tivis Ringer report    Patient Active Problem List   Diagnosis Date Noted  . Closed displaced intertrochanteric fracture of left femur ([place]) 06/09/2016  . Displaced intertrochanteric fracture of left femur, initial encounter for closed fracture ([place]) 06/09/2016  . Hip fracture ([place]) 06/09/2016  . Wrist drop, left 03/10/2016  . Acute exacerbation of chronic obstructive pulmonary disease (COPD) ([place]) 05/06/2015  . COPD exacerbation ([place]) 01/22/2015  . Constipation   . Orthostatic hypotension 11/11/2014  . Duodenal ulcer   . Polypharmacy 10/24/2014  . Smoking 10/24/2014  . Trochanteric bursitis of left hip 10/01/2014  . Rash and nonspecific skin eruption 09/17/2014  . Left hip pain 09/17/2014  . Immunization, tetanus-diphtheria 09/05/2014  . Iron deficiency anemia 11/12/2013  . Health care maintenance 05/14/2013  . Nausea and vomiting 04/17/2013  . Insomnia 03/06/2013  . GERD (gastroesophageal reflux disease) 03/06/2013  . Protein-calorie malnutrition, severe ([place]) 03/04/2013  . Chronic pain 03/02/2013  .  Dyslipidemia 02/26/2013  . Seasonal allergies 07/09/2012  . 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 Osteoporosis 06/17/2009  . Bipolar I disorder, most recent episode depressed ([place]) 06/02/2009  . Vitamin D deficiency 01/03/2007  . BACK PAIN, LUMBAR 12/07/2005    Past Surgical History:  Procedure Laterality Date  . ANKLE DEBRIDEMENT Left 10/2010  . AUGMENTATION MAMMAPLASTY  ~ 2007  . CESAREAN SECTION  1974; 1979  . ESOPHAGOGASTRODUODENOSCOPY N/A 11/07/2014   Procedure:  ESOPHAGOGASTRODUODENOSCOPY (EGD);  Surgeon: Irene Shipper, MD;  Location: [facility] ENDOSCOPY;  Service: Endoscopy;  Laterality: N/A;  . FRACTURE SURGERY    . ORIF ANKLE FRACTURE Left 10/2010  . TUBAL LIGATION  1979    OB History    No data available       Home Medications    Prior to Admission medications   Medication Sig Start Date End Date Taking? Authorizing Provider  alendronate (FOSAMAX) 70 MG tablet Take 1 tablet (70 mg total) by mouth once a week. Take with a full glass of water on an empty stomach. 10/16/14  Yes Rushil Sherrye Payor, MD  Cholecalciferol 1000 units TBDP Take 1,000 mg by mouth daily.   Yes Historical Provider, MD  fluticasone (FLONASE) 50 MCG/ACT nasal spray Place 2 sprays into both nostrils daily.   Yes Historical Provider, MD  Fluticasone-Salmeterol (ADVAIR DISKUS) 250-50 MCG/DOSE AEPB INHALE 1 PUFF INTO THE LUNGS 2 TIMES DAILY 03/19/14  Yes Jones Bales, MD  gabapentin (NEURONTIN) 300 MG capsule Take 300 mg by mouth at bedtime.  06/25/14  Yes Historical Provider, MD  guaiFENesin-dextromethorphan (ROBITUSSIN DM) 100-10 MG/5ML syrup Take 5 mLs by mouth every 4 (four) hours as needed for cough. 02/04/15  Yes Rushil Sherrye Payor, MD  HYDROcodone-acetaminophen (NORCO/VICODIN) 5-325 MG tablet Take 1 tablet by mouth every 6 (six) hours as needed for moderate pain or severe pain. 12/15/15  Yes Lajean Saver, MD  ipratropium-albuterol (DUONEB) 0.5-2.5 (3) MG/3ML SOLN 4 times a day (breakfast, lunch, dinner, bedtime).Can take 2 additional treatments if needed on bad days. DX J43.8 Patient taking differently: Take 3 mLs by mouth 4 (four) times daily.  07/03/14  Yes Kathee Delton, MD  lamoTRIgine (LAMICTAL) 100 MG tablet TAKE ONE TABLET EVERY DAY 09/23/14  Yes Sid Falcon, MD  levocetirizine (XYZAL) 5 MG tablet Take 5 mg by mouth at bedtime.    Yes Historical Provider, MD  Melatonin 10 MG TABS Take 1 tablet by mouth at bedtime as needed. Patient taking differently: Take 1 tablet by mouth at  bedtime.  05/26/15  Yes Bartholomew Crews, MD  meloxicam (MOBIC) 7.5 MG tablet Take 7.5 mg by mouth daily.   Yes Historical Provider, MD  OLANZapine (ZYPREXA) 2.5 MG tablet Take 2.5 mg by mouth at bedtime.   Yes Historical Provider, MD  OXYGEN Inhale 3 L into the lungs continuous.   Yes Historical Provider, MD  pantoprazole (PROTONIX) 40 MG tablet Take 1 tablet (40 mg total) by mouth 2 (two) times daily. 11/09/14  Yes Burgess Estelle, MD  polyethylene glycol powder (GLYCOLAX/MIRALAX) powder TAKE 17G DISSOLVED IN 8OZ WATER DAILY ASNEEDED FOR MODERATE CONSTIPATION 09/23/14  Yes Sid Falcon, MD  PROAIR HFA 108 (90 BASE) MCG/ACT inhaler INHALE ONE PUFF BY MOUTH EVERY 2 TO 4 HOURS AS NEEDED FOR WHEEZING 05/21/14  Yes Rushil Sherrye Payor, MD  senna (SENOKOT) 8.6 MG TABS tablet Take 2 tablets (17.2 mg total) by mouth 3 (three) times daily as needed for mild constipation. 11/13/14  Yes Jule Ser, DO  Spacer/Aero-Holding Chambers (AEROCHAMBER Z-STAT PLUS/MEDIUM) inhaler Use with rescue inhaler 05/08/15  Yes Zada Finders, MD  venlafaxine XR (EFFEXOR-XR) 37.5 MG 24 hr capsule Take 37.5 mg by mouth every evening. 10/24/15  Yes Historical Provider, MD  white petrolatum (VASELINE) GEL Apply 1 application topically daily. To both feet for dry skin   Yes Historical Provider, MD  zolpidem (AMBIEN) 5 MG tablet Take 5 mg by mouth at bedtime as needed for sleep.   Yes Historical Provider, MD    Family History Family History  Problem Relation Age of Onset  . Myelodysplastic syndrome Father     Died from myelofibrosis though diagnosed post-mortem  . Stroke Neg Hx   . Cancer Neg Hx     Social History Social History  Substance Use Topics  . Smoking status: Current Every Day Smoker    Packs/day: 0.50    Years: 45.00    Types: Cigarettes  . Smokeless tobacco: Never Used     Comment: 11/11/2014 ""smoking 1 cigarettes/day; that's down from 2 ppd"  . Alcohol use 0.0 oz/week     Comment: 11/11/2014 "nothing in 7 years;  never had a problem w/it"     Allergies   Banana; Lyrica [pregabalin]; and Pollen extract   Review of Systems Review of Systems  Constitutional: Negative for chills and fever.  HENT: Negative for congestion and rhinorrhea.   Eyes: Negative for redness and visual disturbance.  Respiratory: Negative for shortness of breath and wheezing.   Cardiovascular: Negative for chest pain and palpitations.  Gastrointestinal: Negative for nausea and vomiting.  Genitourinary: Negative for dysuria and urgency.  Musculoskeletal: Positive for arthralgias and myalgias.  Skin: Negative for pallor and wound.  Neurological: Positive for weakness and headaches. Negative for dizziness.     Physical Exam Updated Vital Signs BP (!) 130/97   Pulse (!) 114   Temp 97.8 F (36.6 C) (Oral)   Resp (!) 22   SpO2 99%   Physical Exam  Constitutional: She is oriented to person, place, and time. She appears well-developed and well-nourished. No distress.  Covered in feces  HENT:  Head: Normocephalic and atraumatic.  Eyes: EOM are normal. Pupils are equal, round, and reactive to light.  Neck: Normal range of motion. Neck supple.  Cardiovascular: Normal rate and regular rhythm.  Exam reveals no gallop and no friction rub.   No murmur heard. Pulmonary/Chest: Effort normal. She has no wheezes. She has no rales.  Abdominal: Soft. She exhibits no distension and no mass. There is no tenderness. There is no guarding.  Musculoskeletal: She exhibits tenderness and deformity. She exhibits no edema.  Tenderness diffusely throughout the entire body. Patient is most tender on my exam to the left hip as well as the left knee and femur. She also has tenderness to the left hand left shoulder and diffusely across the chest. She has some midline spinal tenderness from the T4-T8. Also has bruises to the head and complaining of severe neck pain.  Neurological: She is alert and oriented to person, place, and time.  Skin: Skin is  warm and dry. She is not diaphoretic.  Psychiatric: She has a normal mood and affect. Her behavior is normal.  Nursing note and vitals reviewed.    ED Treatments / Results  Labs (all labs ordered are listed, but only abnormal results are displayed) Labs Reviewed  CBC WITH DIFFERENTIAL/PLATELET - Abnormal; Notable for the following:       Result Value   WBC  18.1 (*)    Hemoglobin 11.0 (*)    HCT 35.3 (*)    MCH 25.4 (*)    RDW 15.8 (*)    Platelets 408 (*)    Neutro Abs 15.8 (*)    Monocytes Absolute 1.4 (*)    All other components within normal limits  COMPREHENSIVE METABOLIC PANEL - Abnormal; Notable for the following:    Potassium 3.2 (*)    Glucose, Bld 101 (*)    AST 113 (*)    All other components within normal limits  CK - Abnormal; Notable for the following:    Total CK 3,714 (*)    All other components within normal limits  URINALYSIS, ROUTINE W REFLEX MICROSCOPIC - Abnormal; Notable for the following:    Hgb urine dipstick MODERATE (*)    Ketones, ur 20 (*)    Protein, ur 30 (*)    All other components within normal limits  RAPID URINE DRUG SCREEN, HOSP PERFORMED - Abnormal; Notable for the following:    Opiates POSITIVE (*)    All other components within normal limits  I-STAT CHEM 8, ED - Abnormal; Notable for the following:    Potassium 3.0 (*)    Chloride 100 (*)    Calcium, Ion 1.05 (*)    Hemoglobin 11.9 (*)    HCT 35.0 (*)    All other components within normal limits  LAMOTRIGINE LEVEL    EKG  EKG Interpretation  Date/Time:  Thursday June 09 2016 18:10:44 EDT Ventricular Rate:  102 PR Interval:    QRS Duration: 65 QT Interval:  362 QTC Calculation: 472 R Axis:   95 Text Interpretation:  Sinus tachycardia Multiform ventricular premature complexes Biatrial enlargement Right axis deviation Borderline T abnormalities, anterior leads No significant change since last tracing Confirmed by Marshal Eskew MD, Quillian Quince [phone]) on 06/09/2016 6:18:37 PM        Radiology Dg Chest 1 View  Result Date: 06/09/2016 CLINICAL DATA:  Status post fall, with concern for chest injury. Left superior shoulder bruising. Initial encounter. EXAM: CHEST 1 VIEW COMPARISON:  Chest radiograph and left rib radiographs performed 12/18/2015 FINDINGS: The lungs are hyperexpanded, with underlying emphysema and large upper lobe bulla. There is no evidence of focal opacification, pleural effusion or pneumothorax. The cardiomediastinal silhouette is within normal limits. No acute osseous abnormalities are seen. IMPRESSION: Findings of COPD, with underlying emphysema and large upper lobe bulla. No displaced rib fracture seen. Electronically Signed   By: Garald Balding M.D.   On: 06/09/2016 19:50   Dg Thoracic Spine 2 View  Result Date: 06/09/2016 CLINICAL DATA:  Status post fall, with upper back pain. Initial encounter. EXAM: THORACIC SPINE 2 VIEWS COMPARISON:  Thoracic spine radiographs performed 12/18/2015 FINDINGS: There is no evidence of fracture or subluxation. Vertebral bodies demonstrate normal height and alignment. Intervertebral disc spaces are preserved. The visualized portions of both lungs are clear. The mediastinum is unremarkable in appearance. IMPRESSION: No evidence of fracture or subluxation along the thoracic spine. Electronically Signed   By: Garald Balding M.D.   On: 06/09/2016 19:59   Dg Lumbar Spine Complete  Result Date: 06/09/2016 CLINICAL DATA:  Status post fall, with lower back pain. Initial encounter. EXAM: LUMBAR SPINE - COMPLETE 4+ VIEW COMPARISON:  Lumbar spine radiographs performed 12/18/2015 FINDINGS: There is no evidence of acute fracture or subluxation. Vertebral bodies demonstrate normal height. There is mild grade 1 anterolisthesis of L4 on L5. Intervertebral disc spaces are preserved. The visualized bowel gas pattern is unremarkable  in appearance; air and stool are noted within the colon. The sacroiliac joints are within normal limits. IMPRESSION:  No evidence of acute fracture or subluxation along the lumbar spine. Electronically Signed   By: Garald Balding M.D.   On: 06/09/2016 20:00   Dg Pelvis 1-2 Views  Result Date: 06/09/2016 CLINICAL DATA:  [place] today, LEFT body pain. EXAM: PELVIS - 1-2 VIEW COMPARISON:  CT abdomen and pelvis November 04, 2014 FINDINGS: Acute LEFT femur intertrochanteric fracture, with internal rotation, impaction and varus angulation of distal bony fragments. No dislocation. No destructive bony lesions. Osteopenia. Soft tissue planes are nonsuspicious. IMPRESSION: Acute displaced LEFT femur intertrochanteric fracture. No dislocation. Electronically Signed   By: Elon Alas M.D.   On: 06/09/2016 19:50   Ct Head Wo Contrast  Result Date: 06/09/2016 CLINICAL DATA:  Found down in apartment, weakness EXAM: CT HEAD WITHOUT CONTRAST CT CERVICAL SPINE WITHOUT CONTRAST TECHNIQUE: Multidetector CT imaging of the head and cervical spine was performed following the standard protocol without intravenous contrast. Multiplanar CT image reconstructions of the cervical spine were also generated. COMPARISON:  CT head 12/22/2015, CT cervical spine 12/18/2015 FINDINGS: CT HEAD FINDINGS Brain: Normal ventricular morphology. No midline shift or mass effect. Normal appearance of brain parenchyma. No intracranial hemorrhage, mass lesion or evidence acute infarction. No extra-axial fluid collections. Vascular: Unremarkable Skull: Intact Sinuses/Orbits: Cleared Other: N/A CT CERVICAL SPINE FINDINGS Alignment: Mild retrolisthesis at C4-C5 unchanged. Remaining alignments normal. Skull base and vertebrae: Visualized skullbase intact. Nondisplaced fractures identified at the anterior and posterior aspects of the LEFT vertebral foramen at C3. The anterior fracture appears corticated while the posterior fracture does not appear corticated, leaking fractures age-indeterminate. These are new since 12/18/2015 however. No additional fracture, dislocation, or  bone destruction. Encroachment upon the RIGHT cervical neural foramina at C4-C5 and C5-C6 by uncovertebral spurs. Soft tissues and spinal canal: Prevertebral soft tissues normal thickness. Again identified 14 mm RIGHT thyroid nodule. Low-attenuation material within the RIGHT lateral aspect of the proximal trachea, potentially mucus though cannot completely exclude low-attenuation mass measuring 13 x 6 x 10 mm with this appearance. Disc levels:  Disc space narrowing C4-C5, C5-C6, C6-C7. Upper chest: Lung apices appear emphysematous but clear Other: N/A IMPRESSION: Generalized atrophy. No acute intracranial abnormalities. Age-indeterminate fractures at the anterior and posterior walls of the LEFT vertebral foramen at C3, though new since 12/18/2015 ; acuity of these fractures could be assessed by MR. Scattered degenerative changes of the cervical spine with encroachment upon the RIGHT cervical neural foramina at C4-C5 and C5-C6. Persistent 14 mm RIGHT thyroid nodule; unless previously assessed, recommend followup nonemergent thyroid ultrasound evaluation. Intraluminal low-attenuation material RIGHT lateral aspect of trachea potentially mucus though cannot exclude tracheal mass with this appearance. Findings discussed with Dr. Tyrone Nine on 06/10/2014 at 2015 hours. Electronically Signed   By: Lavonia Dana M.D.   On: 06/09/2016 20:20   Ct Cervical Spine Wo Contrast  Result Date: 06/09/2016 CLINICAL DATA:  Found down in apartment, weakness EXAM: CT HEAD WITHOUT CONTRAST CT CERVICAL SPINE WITHOUT CONTRAST TECHNIQUE: Multidetector CT imaging of the head and cervical spine was performed following the standard protocol without intravenous contrast. Multiplanar CT image reconstructions of the cervical spine were also generated. COMPARISON:  CT head 12/22/2015, CT cervical spine 12/18/2015 FINDINGS: CT HEAD FINDINGS Brain: Normal ventricular morphology. No midline shift or mass effect. Normal appearance of brain parenchyma. No  intracranial hemorrhage, mass lesion or evidence acute infarction. No extra-axial fluid collections. Vascular: Unremarkable Skull: Intact Sinuses/Orbits: Cleared Other:  N/A CT CERVICAL SPINE FINDINGS Alignment: Mild retrolisthesis at C4-C5 unchanged. Remaining alignments normal. Skull base and vertebrae: Visualized skullbase intact. Nondisplaced fractures identified at the anterior and posterior aspects of the LEFT vertebral foramen at C3. The anterior fracture appears corticated while the posterior fracture does not appear corticated, leaking fractures age-indeterminate. These are new since 12/18/2015 however. No additional fracture, dislocation, or bone destruction. Encroachment upon the RIGHT cervical neural foramina at C4-C5 and C5-C6 by uncovertebral spurs. Soft tissues and spinal canal: Prevertebral soft tissues normal thickness. Again identified 14 mm RIGHT thyroid nodule. Low-attenuation material within the RIGHT lateral aspect of the proximal trachea, potentially mucus though cannot completely exclude low-attenuation mass measuring 13 x 6 x 10 mm with this appearance. Disc levels:  Disc space narrowing C4-C5, C5-C6, C6-C7. Upper chest: Lung apices appear emphysematous but clear Other: N/A IMPRESSION: Generalized atrophy. No acute intracranial abnormalities. Age-indeterminate fractures at the anterior and posterior walls of the LEFT vertebral foramen at C3, though new since 12/18/2015 ; acuity of these fractures could be assessed by MR. Scattered degenerative changes of the cervical spine with encroachment upon the RIGHT cervical neural foramina at C4-C5 and C5-C6. Persistent 14 mm RIGHT thyroid nodule; unless previously assessed, recommend followup nonemergent thyroid ultrasound evaluation. Intraluminal low-attenuation material RIGHT lateral aspect of trachea potentially mucus though cannot exclude tracheal mass with this appearance. Findings discussed with Dr. Tyrone Nine on 06/10/2014 at 2015 hours.  Electronically Signed   By: Lavonia Dana M.D.   On: 06/09/2016 20:20   Dg Shoulder Left  Result Date: 06/09/2016 CLINICAL DATA:  Status post fall, with left shoulder bruising. Initial encounter. EXAM: LEFT SHOULDER - 2+ VIEW COMPARISON:  Left shoulder radiographs performed 12/18/2015 FINDINGS: There is no evidence of fracture or dislocation. The left humeral head is seated within the glenoid fossa. The acromioclavicular joint is unremarkable in appearance. No significant soft tissue abnormalities are seen. The visualized portions of the left lung are clear. IMPRESSION: No evidence of fracture or dislocation. Electronically Signed   By: Garald Balding M.D.   On: 06/09/2016 19:51   Dg Knee Complete 4 Views Left  Result Date: 06/09/2016 CLINICAL DATA:  Status post fall, with left knee bruising. Initial encounter. EXAM: LEFT KNEE - COMPLETE 4+ VIEW COMPARISON:  None. FINDINGS: There is no evidence of fracture or dislocation. The joint spaces are preserved. No significant degenerative change is seen; the patellofemoral joint is grossly unremarkable in appearance. No significant joint effusion is seen. The visualized soft tissues are normal in appearance. IMPRESSION: No evidence of fracture or dislocation. Electronically Signed   By: Garald Balding M.D.   On: 06/09/2016 19:52   Dg Humerus Left  Result Date: 06/09/2016 CLINICAL DATA:  Status post fall, with left arm pain. Initial encounter. EXAM: LEFT HUMERUS - 2+ VIEW COMPARISON:  None. FINDINGS: There is no evidence of fracture or dislocation. The left humerus appears intact. The left humeral head remains seated at the glenoid fossa. The elbow joint is incompletely assessed, but appears grossly unremarkable. A peripheral IV catheter is noted at the antecubital fossa. IMPRESSION: No evidence of fracture or dislocation. Electronically Signed   By: Garald Balding M.D.   On: 06/09/2016 19:51   Dg Hand Complete Left  Result Date: 06/09/2016 CLINICAL DATA:   Status post fall, with left hand bruising. Initial encounter. EXAM: LEFT HAND - COMPLETE 3+ VIEW COMPARISON:  Left hand radiographs performed 10/26/2010 FINDINGS: There is no evidence of acute fracture or dislocation. There is chronic deformity of the  distal third and fourth metacarpals. Mild degenerative change is noted at the first carpometacarpal joint. The carpal rows are otherwise intact, and demonstrate normal alignment. The soft tissues are unremarkable in appearance. IMPRESSION: No evidence of acute fracture or dislocation. Electronically Signed   By: Garald Balding M.D.   On: 06/09/2016 19:54   Dg Femur Min 2 Views Left  Result Date: 06/09/2016 CLINICAL DATA:  Status post fall, with left hip bruising. Initial encounter. EXAM: LEFT FEMUR 2 VIEWS COMPARISON:  None. FINDINGS: There is a comminuted left femoral intertrochanteric fracture, with displaced lesser and greater trochanteric fragments. The left femoral head remains seated at the acetabulum. The distal left femur appears intact. The left sacroiliac joint is grossly unremarkable. No knee joint effusion is identified. IMPRESSION: Comminuted left femoral intertrochanteric fracture, with displaced lesser and greater trochanteric fragments. Electronically Signed   By: Garald Balding M.D.   On: 06/09/2016 19:53    Procedures Procedures (including critical care time)  Medications Ordered in ED Medications  0.9 %  sodium chloride infusion ( Intravenous New Bag/Given 06/09/16 2346)  morphine 4 MG/ML injection 1 mg (not administered)  potassium chloride SA (K-DUR,KLOR-CON) CR tablet 40 mEq (40 mEq Oral Given 06/09/16 2103)  magnesium oxide (MAG-OX) tablet 800 mg (800 mg Oral Given 06/09/16 2103)  morphine 4 MG/ML injection 2 mg (2 mg Intravenous Given 06/09/16 2025)  sodium chloride 0.9 % bolus 1,000 mL (0 mLs Intravenous Stopped 06/09/16 2336)  morphine 4 MG/ML injection 2 mg (2 mg Intravenous Given 06/09/16 2352)     Initial Impression /  Assessment and Plan / ED Course  I have reviewed the triage vital signs and the nursing notes.  Pertinent labs & imaging results that were available during my care of the patient were reviewed by me and considered in my medical decision making (see chart for details).     66 yo F With a chief complaint of fall. This was unwitnessed and the patient is unsure what happened. She had been laying on the ground for a prolonged period of time. We'll check a CK labs CTA head when films of the left upper and lower extremity as well as the upper spine.  CT scan with concern for possible C spine fx.  Discussed with neurosurgery recommended MR to further evaluate. Placed in collar.   L intertrochanteric hip fx.  Discussed with Dr. Ninfa Linden, ortho will do operation likely tomorrow afternoon.   The patients results and plan were reviewed and discussed.   Any x-rays performed were independently reviewed by myself.   Differential diagnosis were considered with the presenting HPI.  Medications  0.9 %  sodium chloride infusion ( Intravenous New Bag/Given 06/09/16 2346)  morphine 4 MG/ML injection 1 mg (not administered)  potassium chloride SA (K-DUR,KLOR-CON) CR tablet 40 mEq (40 mEq Oral Given 06/09/16 2103)  magnesium oxide (MAG-OX) tablet 800 mg (800 mg Oral Given 06/09/16 2103)  morphine 4 MG/ML injection 2 mg (2 mg Intravenous Given 06/09/16 2025)  sodium chloride 0.9 % bolus 1,000 mL (0 mLs Intravenous Stopped 06/09/16 2336)  morphine 4 MG/ML injection 2 mg (2 mg Intravenous Given 06/09/16 2352)    Vitals:   06/09/16 2130 06/09/16 2200 06/09/16 2334 06/10/16 0000  BP: 133/68 135/67 (!) 142/68 (!) 130/97  Pulse:   99 (!) 114  Resp: 11 14 13  (!) 22  Temp:      TempSrc:      SpO2:   100% 99%    Final diagnoses:  Closed  nondisplaced intertrochanteric fracture of left femur, initial encounter ([place])  Fall in home, initial encounter  Closed nondisplaced fracture of third cervical vertebra,  unspecified fracture morphology, initial encounter [facility])    Admission/ observation were discussed with the admitting physician, patient and/or family and they are comfortable with the plan.    Final Clinical Impressions(s) / ED Diagnoses   Final diagnoses:  Closed nondisplaced intertrochanteric fracture of left femur, initial encounter ([place])  Fall in home, initial encounter  Closed nondisplaced fracture of third cervical vertebra, unspecified fracture morphology, initial encounter [facility])    New Prescriptions New Prescriptions   No medications on file     Deno Etienne, DO 06/10/16 0016

## 2016-06-09 NOTE — ED Triage Notes (Signed)
Per [facility], Pt found by maintenance man of her apartment home on floor. Pt was last seen yesterday around lunch. Pt found covered in feces. Pt normally wears 3-4 L O2 via [place]. Pt was not wearing O2, and appeared cyanotic /mottled. when found. EMS placed pt on 6L [place], O2 sats 100%. Ems reports obvious dislocation to posterior L hip, weak pedal pulses and poor cap refill. Pt has bruising to L hand and abrasion to l shoulder.

## 2016-06-09 NOTE — Progress Notes (Signed)
Patient ID: [name], female   DOB: 07-Feb-1951, 66 y.o.   MRN: [id number] The x-rays have been reviewed.  She does have a displaced left hip intertrochanteric fracture.  Surgery will be planned for tomorrow afternoon.

## 2016-06-09 NOTE — H&P (Signed)
Date: 06/09/2016               Patient Name:  [name] MRN: [id number]  DOB: 1950/12/22 Age / Sex: 66 y.o., female   PCP: Riccardo Dubin, MD         Medical Service: Internal Medicine Teaching Service         Attending Physician: Dr. Aldine Contes, MD    First Contact: Dr. Wynetta Emery Pager: [phone]  Second Contact: Dr. Marlowe Sax Pager: [phone]       After Hours (After 5p/  First Contact Pager: [phone]  weekends / holidays): Second Contact Pager: [phone]   Chief Complaint: Fall  History of Present Illness: [name] is a 66 y.o. lady,with PMHx significant for COPD, osteoporosis, chronic pain and bipolar disorder brought to ED via EMS after having a fall.  Patient could not recall much detail about her current fall. She do have an history of frequent falls and some of them leading to fractures. Most recent one was in October 2017 when she had some clavicular fracture. She was living in [facility] assisted living for 1-1/2 years, recently moved to independent apartment last week because of her personal choice.  According to patient she remember eating her breakfast and taking her hydrocodone at 11 AM, she could not remember anything leading to fall, all she remembers is that she was calling for help as she was unable to breath. She did had urinary and bowel accident while on the floor, recalling that she was unable to get up to go to the bathroom. She might have lost some consciousness in between but she cannot recall it clearly. She was on the ground for a few hours, before a worker from her apartment complex building found her on the ground and called the EMS. EMS found her on ground covered with urine and feces. She was hypoxic requiring 6 L of oxygen, she is on 3 L all the time.  Patient denied any pain anywhere else except back and left hip. She denies any recent illness, upper respiratory symptoms, chest pain, palpitations, history of seizures, shortness of breath,  fever or chills. She denies any change in her appetite, bowel habits. She denies any dysuria or hematuria.  In ED, her vitals were stable, saturating 99-100% on 3 L, have leukocytosis of 18.1 with neutrophil predominance, CK of 3,714, positive urine dipstick without any RBCs, displaced intertrochanteric fracture of left femur and a fracture of C3.  Meds:  Current Meds  Medication Sig  . alendronate (FOSAMAX) 70 MG tablet Take 1 tablet (70 mg total) by mouth once a week. Take with a full glass of water on an empty stomach.  . Cholecalciferol 1000 units TBDP Take 1,000 mg by mouth daily.  . fluticasone (FLONASE) 50 MCG/ACT nasal spray Place 2 sprays into both nostrils daily.  . Fluticasone-Salmeterol (ADVAIR DISKUS) 250-50 MCG/DOSE AEPB INHALE 1 PUFF INTO THE LUNGS 2 TIMES DAILY  . gabapentin (NEURONTIN) 300 MG capsule Take 300 mg by mouth at bedtime.   Marland Kitchen guaiFENesin-dextromethorphan (ROBITUSSIN DM) 100-10 MG/5ML syrup Take 5 mLs by mouth every 4 (four) hours as needed for cough.  Marland Kitchen HYDROcodone-acetaminophen (NORCO/VICODIN) 5-325 MG tablet Take 1 tablet by mouth every 6 (six) hours as needed for moderate pain or severe pain.  Marland Kitchen ipratropium-albuterol (DUONEB) 0.5-2.5 (3) MG/3ML SOLN 4 times a day (breakfast, lunch, dinner, bedtime).Can take 2 additional treatments if needed on bad days. DX J43.8 (Patient taking differently: Take 3 mLs by  mouth 4 (four) times daily. )  . lamoTRIgine (LAMICTAL) 100 MG tablet TAKE ONE TABLET EVERY DAY  . levocetirizine (XYZAL) 5 MG tablet Take 5 mg by mouth at bedtime.   . Melatonin 10 MG TABS Take 1 tablet by mouth at bedtime as needed. (Patient taking differently: Take 1 tablet by mouth at bedtime. )  . meloxicam (MOBIC) 7.5 MG tablet Take 7.5 mg by mouth daily.  Marland Kitchen OLANZapine (ZYPREXA) 2.5 MG tablet Take 2.5 mg by mouth at bedtime.  . OXYGEN Inhale 3 L into the lungs continuous.  . pantoprazole (PROTONIX) 40 MG tablet Take 1 tablet (40 mg total) by mouth 2 (two)  times daily.  . polyethylene glycol powder (GLYCOLAX/MIRALAX) powder TAKE 17G DISSOLVED IN 8OZ WATER DAILY ASNEEDED FOR MODERATE CONSTIPATION  . PROAIR HFA 108 (90 BASE) MCG/ACT inhaler INHALE ONE PUFF BY MOUTH EVERY 2 TO 4 HOURS AS NEEDED FOR WHEEZING  . senna (SENOKOT) 8.6 MG TABS tablet Take 2 tablets (17.2 mg total) by mouth 3 (three) times daily as needed for mild constipation.  Marland Kitchen Spacer/Aero-Holding Chambers (AEROCHAMBER Z-STAT PLUS/MEDIUM) inhaler Use with rescue inhaler  . venlafaxine XR (EFFEXOR-XR) 37.5 MG 24 hr capsule Take 37.5 mg by mouth every evening.  . white petrolatum (VASELINE) GEL Apply 1 application topically daily. To both feet for dry skin  . zolpidem (AMBIEN) 5 MG tablet Take 5 mg by mouth at bedtime as needed for sleep.     Allergies: Allergies as of 06/09/2016 - Review Complete 06/09/2016  Allergen Reaction Noted  . Banana Shortness Of Breath and Nausea And Vomiting 01/10/2012  . Lyrica [pregabalin] Other (See Comments) 06/26/2014  . Pollen extract Other (See Comments) 11/20/2012   Past Medical History:  Diagnosis Date  . Anemia   . Anxiety   . Arthritis    "all in my body, primarily left hip" (11/11/2014)  . Bipolar disorder ([place])   . Chronic lower back pain   . Chronic pain syndrome    follows at [facility]  . COPD (chronic obstructive pulmonary disease) (H[facility])    oxygen dependent (3L home continuous)  . DEFICIENCY, VITAMIN D NOS 01/03/2007  . Depression   . Duodenal ulcer, chronic   . Elevated liver function tests   . History of blood transfusion 03/2014; 10/2014   "low HgB" (11/11/2014)  . Hyperthyroidism    "borderline"  . On home oxygen therapy    "3L; 24/7" (8/30//2016)  . OSTEOPOROSIS 06/17/2009   DEXA 05/2009 : L femur -2.9; R femur -2.5. Alendronate on med list but not taking. Needs addressed ASAP as h/o fractures.    . Pneumonia X 1?  . Sciatic pain   . Shortness of breath    "all the time" (02/27/2013)  . Substance abuse      narcotics, alcohol, tobacco  . Suicidal ideation 2007    attempted overdose 2012  Dr. Tivis Ringer report    Family History: Noncontributory.  Social History: Lives alone. Smokes  half pack per day. Denies any alcohol and illicit drug use.  Review of Systems: A complete ROS was negative except as per HPI.   Physical Exam: Blood pressure 135/67, pulse 96, temperature 97.8 F (36.6 C), temperature source Oral, resp. rate 14, SpO2 100 %. Vitals:   06/09/16 2015 06/09/16 2100 06/09/16 2130 06/09/16 2200  BP: (!) 133/56 135/71 133/68 135/67  Pulse: 99 96    Resp: 12 13 11 14   Temp:      TempSrc:  SpO2: 100% 100%     General: Vital signs reviewed. Underweight patient, appears little drowsy, in no acute distress and cooperative with exam.  Head: Normocephalic and atraumatic. Eyes: EOMI, conjunctivae normal, no scleral icterus.  Neck: Supple, trachea midline, having neck collar. Cardiovascular: RRR, S1 normal, S2 normal, no murmurs, gallops, or rubs. Pulmonary/Chest: Clear to auscultation bilaterally, no wheezes, rales, or rhonchi. Abdominal: Soft, suprapubic tenderness, non-distended, BS +, no masses, organomegaly, or guarding present.  Extremities: Left wrist drop,Dry scaly skin over both feet and lower legs.No lower extremity edema bilaterally,  pulses symmetric and intact bilaterally. No cyanosis or clubbing. Neurological: A&O x3, Strength is normal and symmetric bilaterally, cranial nerve II-XII are grossly intact, no focal motor deficit, sensory intact to light touch bilaterally.  Skin: Warm, dry and intact., Multiple ecchymosis on both upper limbs, small laceration on medial side of below right knee, in duration and erythema on left shoulder.   Labs. CBC    Component Value Date/Time   WBC 18.1 (H) 06/09/2016 1823   RBC 4.33 06/09/2016 1823   HGB 11.9 (L) 06/09/2016 1829   HCT 35.0 (L) 06/09/2016 1829   HCT 38.2 11/21/2014 1104   PLT 408 (H) 06/09/2016 1823   PLT 740 (H)  11/21/2014 1104   MCV 81.5 06/09/2016 1823   MCV 97 11/21/2014 1104   MCH 25.4 (L) 06/09/2016 1823   MCHC 31.2 06/09/2016 1823   RDW 15.8 (H) 06/09/2016 1823   RDW 19.3 (H) 11/21/2014 1104   LYMPHSABS 0.8 06/09/2016 1823   MONOABS 1.4 (H) 06/09/2016 1823   EOSABS 0.0 06/09/2016 1823   BASOSABS 0.0 06/09/2016 1823   CMP Latest Ref Rng & Units 06/09/2016 06/09/2016 12/18/2015  Glucose 65 - 99 mg/dL 98 101(H) 96  BUN 6 - 20 mg/dL 20 19 7   Creatinine 0.44 - 1.00 mg/dL 0.50 0.53 0.42(L)  Sodium 135 - 145 mmol/L 141 142 138  Potassium 3.5 - 5.1 mmol/L 3.0(L) 3.2(L) 3.9  Chloride 101 - 111 mmol/L 100(L) 101 95(L)  CO2 22 - 32 mmol/L - 29 33(H)  Calcium 8.9 - 10.3 mg/dL - 9.1 8.4(L)  Total Protein 6.5 - 8.1 g/dL - 6.5 5.8(L)  Total Bilirubin 0.3 - 1.2 mg/dL - 0.4 0.3  Alkaline Phos 38 - 126 U/L - 83 71  AST 15 - 41 U/L - 113(H) 27  ALT 14 - 54 U/L - 40 25   Urinalysis    Component Value Date/Time   COLORURINE YELLOW 06/09/2016 2241   APPEARANCEUR CLEAR 06/09/2016 2241   APPEARANCEUR Clear 11/24/2014 1203   LABSPEC 1.017 06/09/2016 2241   PHURINE 5.0 06/09/2016 2241   GLUCOSEU NEGATIVE 06/09/2016 2241   HGBUR MODERATE (A) 06/09/2016 2241   BILIRUBINUR NEGATIVE 06/09/2016 2241   BILIRUBINUR Negative 11/24/2014 1203   KETONESUR 20 (A) 06/09/2016 2241   PROTEINUR 30 (A) 06/09/2016 2241   UROBILINOGEN 0.2 10/26/2014 1918   NITRITE NEGATIVE 06/09/2016 2241   LEUKOCYTESUR NEGATIVE 06/09/2016 2241   LEUKOCYTESUR Negative 11/24/2014 1203   Drugs of Abuse     Component Value Date/Time   LABOPIA POSITIVE (A) 06/09/2016 2247   COCAINSCRNUR NONE DETECTED 06/09/2016 2247   LABBENZ NONE DETECTED 06/09/2016 2247   AMPHETMU NONE DETECTED 06/09/2016 2247   THCU NONE DETECTED 06/09/2016 2247   LABBARB NONE DETECTED 06/09/2016 2247    CK. 3714  EKG: Sinus tachycardia with multiform ventricular premature complexes, no acute change.  CXR: FINDINGS: The lungs are hyperexpanded, with  underlying emphysema and large upper lobe bulla.  There is no evidence of focal opacification, pleural effusion or pneumothorax.  The cardiomediastinal silhouette is within normal limits. No acute osseous abnormalities are seen.  IMPRESSION: Findings of COPD, with underlying emphysema and large upper lobe bulla. No displaced rib fracture seen.  DG Lumbar Spine. FINDINGS: There is no evidence of acute fracture or subluxation. Vertebral bodies demonstrate normal height. There is mild grade 1 anterolisthesis of L4 on L5. Intervertebral disc spaces are preserved.  The visualized bowel gas pattern is unremarkable in appearance; air and stool are noted within the colon. The sacroiliac joints are within normal limits.  IMPRESSION: No evidence of acute fracture or subluxation along the lumbar spine.  DG Thoracic Spine. FINDINGS: There is no evidence of fracture or subluxation. Vertebral bodies demonstrate normal height and alignment. Intervertebral disc spaces are preserved.  The visualized portions of both lungs are clear. The mediastinum is unremarkable in appearance.  IMPRESSION: No evidence of fracture or subluxation along the thoracic spine.  DG Pelvis. FINDINGS: Acute LEFT femur intertrochanteric fracture, with internal rotation, impaction and varus angulation of distal bony fragments. No dislocation. No destructive bony lesions. Osteopenia. Soft tissue planes are nonsuspicious.  IMPRESSION: Acute displaced LEFT femur intertrochanteric fracture. No dislocation.  DG Hand left. FINDINGS: There is no evidence of acute fracture or dislocation. There is chronic deformity of the distal third and fourth metacarpals.  Mild degenerative change is noted at the first carpometacarpal joint. The carpal rows are otherwise intact, and demonstrate normal alignment. The soft tissues are unremarkable in appearance.  IMPRESSION: No evidence of acute fracture or dislocation.  DG left  Femur. FINDINGS: There is a comminuted left femoral intertrochanteric fracture, with displaced lesser and greater trochanteric fragments. The left femoral head remains seated at the acetabulum. The distal left femur appears intact.  The left sacroiliac joint is grossly unremarkable. No knee joint effusion is identified.  IMPRESSION: Comminuted left femoral intertrochanteric fracture, with displaced lesser and greater trochanteric fragments.  DG Knee Left. FINDINGS: There is no evidence of fracture or dislocation. The joint spaces are preserved. No significant degenerative change is seen; the patellofemoral joint is grossly unremarkable in appearance.  No significant joint effusion is seen. The visualized soft tissues are normal in appearance.  IMPRESSION: No evidence of fracture or dislocation.  DG Left Humerus. FINDINGS: There is no evidence of fracture or dislocation. The left humerus appears intact. The left humeral head remains seated at the glenoid fossa. The elbow joint is incompletely assessed, but appears grossly unremarkable. A peripheral IV catheter is noted at the antecubital fossa.  IMPRESSION: No evidence of fracture or dislocation.  DG left Shoulder. FINDINGS: There is no evidence of fracture or dislocation. The left humeral head is seated within the glenoid fossa. The acromioclavicular joint is unremarkable in appearance. No significant soft tissue abnormalities are seen. The visualized portions of the left lung are clear.  IMPRESSION: No evidence of fracture or dislocation.  CT Head and Cervical spine WO Contrast. FINDINGS: CT HEAD FINDINGS  Brain: Normal ventricular morphology. No midline shift or mass effect. Normal appearance of brain parenchyma. No intracranial hemorrhage, mass lesion or evidence acute infarction. No extra-axial fluid collections.  Vascular: Unremarkable  Skull: Intact  Sinuses/Orbits: Cleared  Other: N/A  CT  CERVICAL SPINE FINDINGS  Alignment: Mild retrolisthesis at C4-C5 unchanged. Remaining alignments normal.  Skull base and vertebrae: Visualized skullbase intact. Nondisplaced fractures identified at the anterior and posterior aspects of the LEFT vertebral foramen at C3. The anterior fracture appears corticated while the  posterior fracture does not appear corticated, leaking fractures age-indeterminate. These are new since 12/18/2015 however. No additional fracture, dislocation, or bone destruction. Encroachment upon the RIGHT cervical neural foramina at C4-C5 and C5-C6 by uncovertebral spurs.  Soft tissues and spinal canal: Prevertebral soft tissues normal thickness. Again identified 14 mm RIGHT thyroid nodule. Low-attenuation material within the RIGHT lateral aspect of the proximal trachea, potentially mucus though cannot completely exclude low-attenuation mass measuring 13 x 6 x 10 mm with this appearance.  Disc levels:  Disc space narrowing C4-C5, C5-C6, C6-C7.  Upper chest: Lung apices appear emphysematous but clear  Other: N/A  IMPRESSION: Generalized atrophy.  No acute intracranial abnormalities.  Age-indeterminate fractures at the anterior and posterior walls of the LEFT vertebral foramen at C3, though new since 12/18/2015 ; acuity of these fractures could be assessed by MR.  Scattered degenerative changes of the cervical spine with encroachment upon the RIGHT cervical neural foramina at C4-C5 and C5-C6.  Persistent 14 mm RIGHT thyroid nodule; unless previously assessed, recommend followup nonemergent thyroid ultrasound evaluation.  Intraluminal low-attenuation material RIGHT lateral aspect of trachea potentially mucus though cannot exclude tracheal mass with this appearance.  Assessment & Plan by Problem: DENECE SHEARER is a 66 y.o. lady,with PMHx significant for COPD, osteoporosis, chronic pain and bipolar disorder brought to ED via EMS after having a  fall.  Fall/possible loss of consciousness. She has a fall of unclear etiology. Could not recall any precipitating event e.g dizziness, lightheadedness, palpitations, any abnormal shaking of body or incontinence that she is not aware of. She do have an history of frequent falls which causes fractures in the past because of her history of osteoporosis.  Her EKG was without any acute change, does shows some multiform ventricular premature complexes which she had it during her previous EKGs too. Although she had leukocytosis with neutrophil predominance, most likely stress response, as no other sign of infection, UA is negative for any infection. UDS is only positive for opiates.  She takes hydrocodone every 4-6 hour, had COPD requiring 3 L of oxygen all the time, decreased respiratory drive in the setting of COPD and opiates can be responsible, as she recall calling for help as she felt like that she is unable to breathe properly and initially EMS found her requiring more than her normal oxygen. In ED she was saturating well on her home dose of oxygen. -Admit to telemetry. -Check lab for phosphorous, magnesium, folate, TSH and vitamin B12.  Rhabdomyolysis. She had elevated CK, with positive urine dipstick without any RBCs. Her renal functions are within normal limit. Most likely because of her fall. She was given 1 L of normal saline bolus in ED. -IVNS 125 ml/hour -Recheck CK in the morning. -Recheck BMP in the morning.  Closed displaced intertrochanteric fracture of left femur. Orthopedic is following, most likely surgical correction tomorrow afternoon. -Morphine when necessary for pain.  Fractures at the anterior and posterior walls of the LEFT vertebral foramen at C3. Neurosurgery is aware of her C3 fracture. They asked for an MRI to check the acuity. We will appreciate their recommendations. No new focal deficit. -MRI was ordered.  COPD. No current exacerbation. -DuoNeb every 6  hours.  Bipolar disorder. She is on Zyprexa 2.5 mg at bedtime, Lamictal 100 mg daily and Effexor 37.5 mg daily at home. According to patient she is compliant with her medications but could not recall the exact names. -Check Lamictal level. -We continue Effexor, hold Zyprexa and Lamictal.  Chronic pain.  She uses Neurontin 300 mg at bedtime, Norco every 6 hourly and more big 7.5 mg daily for her chronic pain. -We will hold her home meds. -Morphine 1 mg every 2-3 hour for pain.  Right Thyroid Nodule. CT scan shows a 14 mm right thyroid nodule-Might need work up as out patient.  DVT prophylaxis. Heparin CODE STATUS. DO NOT RESUSCITATE Diet. Nothing by mouth after midnight.   Dispo: Admit patient to Inpatient with expected length of stay greater than 2 midnights.  Signed: Lorella Nimrod, MD 06/09/2016, 11:22 PM  Pager: [phone]

## 2016-06-09 NOTE — ED Notes (Signed)
EKG given to Dr. Tyrone Nine

## 2016-06-10 ENCOUNTER — Inpatient Hospital Stay (HOSPITAL_COMMUNITY): Payer: Medicare Other

## 2016-06-10 ENCOUNTER — Inpatient Hospital Stay (HOSPITAL_COMMUNITY): Payer: Medicare Other | Admitting: Anesthesiology

## 2016-06-10 ENCOUNTER — Encounter (HOSPITAL_COMMUNITY)
Admission: EM | Disposition: A | Discharge status: Skilled Nursing Facility | Payer: Self-pay | Source: Home / Self Care | Attending: Internal Medicine

## 2016-06-10 ENCOUNTER — Encounter (HOSPITAL_COMMUNITY): Payer: Self-pay | Admitting: Surgery

## 2016-06-10 DIAGNOSIS — M79605 Pain in left leg: Secondary | ICD-10-CM

## 2016-06-10 DIAGNOSIS — Z91018 Allergy to other foods: Secondary | ICD-10-CM

## 2016-06-10 DIAGNOSIS — Z9114 Patient's other noncompliance with medication regimen: Secondary | ICD-10-CM

## 2016-06-10 DIAGNOSIS — J449 Chronic obstructive pulmonary disease, unspecified: Secondary | ICD-10-CM

## 2016-06-10 DIAGNOSIS — Z79899 Other long term (current) drug therapy: Secondary | ICD-10-CM

## 2016-06-10 DIAGNOSIS — M80852A Other osteoporosis with current pathological fracture, left femur, initial encounter for fracture: Secondary | ICD-10-CM

## 2016-06-10 DIAGNOSIS — R636 Underweight: Secondary | ICD-10-CM

## 2016-06-10 DIAGNOSIS — Y92039 Unspecified place in apartment as the place of occurrence of the external cause: Secondary | ICD-10-CM

## 2016-06-10 DIAGNOSIS — D72829 Elevated white blood cell count, unspecified: Secondary | ICD-10-CM

## 2016-06-10 DIAGNOSIS — Z888 Allergy status to other drugs, medicaments and biological substances status: Secondary | ICD-10-CM

## 2016-06-10 DIAGNOSIS — Z8781 Personal history of (healed) traumatic fracture: Secondary | ICD-10-CM

## 2016-06-10 DIAGNOSIS — F319 Bipolar disorder, unspecified: Secondary | ICD-10-CM

## 2016-06-10 DIAGNOSIS — M6282 Rhabdomyolysis: Secondary | ICD-10-CM

## 2016-06-10 DIAGNOSIS — Z79891 Long term (current) use of opiate analgesic: Secondary | ICD-10-CM

## 2016-06-10 DIAGNOSIS — W19XXXA Unspecified fall, initial encounter: Secondary | ICD-10-CM

## 2016-06-10 DIAGNOSIS — Z9981 Dependence on supplemental oxygen: Secondary | ICD-10-CM

## 2016-06-10 DIAGNOSIS — Z9181 History of falling: Secondary | ICD-10-CM

## 2016-06-10 DIAGNOSIS — Z91048 Other nonmedicinal substance allergy status: Secondary | ICD-10-CM

## 2016-06-10 DIAGNOSIS — R296 Repeated falls: Secondary | ICD-10-CM

## 2016-06-10 DIAGNOSIS — S81811A Laceration without foreign body, right lower leg, initial encounter: Secondary | ICD-10-CM

## 2016-06-10 DIAGNOSIS — F17210 Nicotine dependence, cigarettes, uncomplicated: Secondary | ICD-10-CM

## 2016-06-10 DIAGNOSIS — S72142A Displaced intertrochanteric fracture of left femur, initial encounter for closed fracture: Principal | ICD-10-CM

## 2016-06-10 DIAGNOSIS — S12200A Unspecified displaced fracture of third cervical vertebra, initial encounter for closed fracture: Secondary | ICD-10-CM

## 2016-06-10 HISTORY — PX: INTRAMEDULLARY (IM) NAIL INTERTROCHANTERIC: SHX5875

## 2016-06-10 LAB — TSH: TSH: 0.678 u[IU]/mL (ref 0.350–4.500)

## 2016-06-10 LAB — COMPREHENSIVE METABOLIC PANEL
ALT: 43 U/L (ref 14–54)
ANION GAP: 10 (ref 5–15)
AST: 112 U/L — AB (ref 15–41)
Albumin: 3.2 g/dL — ABNORMAL LOW (ref 3.5–5.0)
Alkaline Phosphatase: 74 U/L (ref 38–126)
BILIRUBIN TOTAL: 0.5 mg/dL (ref 0.3–1.2)
BUN: 14 mg/dL (ref 6–20)
CHLORIDE: 105 mmol/L (ref 101–111)
CO2: 31 mmol/L (ref 22–32)
Calcium: 8.7 mg/dL — ABNORMAL LOW (ref 8.9–10.3)
Creatinine, Ser: 0.54 mg/dL (ref 0.44–1.00)
Glucose, Bld: 89 mg/dL (ref 65–99)
POTASSIUM: 4.3 mmol/L (ref 3.5–5.1)
Sodium: 146 mmol/L — ABNORMAL HIGH (ref 135–145)
TOTAL PROTEIN: 5.9 g/dL — AB (ref 6.5–8.1)

## 2016-06-10 LAB — CBC
HCT: 34.9 % — ABNORMAL LOW (ref 36.0–46.0)
Hemoglobin: 10.7 g/dL — ABNORMAL LOW (ref 12.0–15.0)
MCH: 25.6 pg — ABNORMAL LOW (ref 26.0–34.0)
MCHC: 30.7 g/dL (ref 30.0–36.0)
MCV: 83.5 fL (ref 78.0–100.0)
Platelets: 387 10*3/uL (ref 150–400)
RBC: 4.18 MIL/uL (ref 3.87–5.11)
RDW: 16.1 % — ABNORMAL HIGH (ref 11.5–15.5)
WBC: 16.4 10*3/uL — AB (ref 4.0–10.5)

## 2016-06-10 LAB — PHOSPHORUS: PHOSPHORUS: 4 mg/dL (ref 2.5–4.6)

## 2016-06-10 LAB — VITAMIN B12: Vitamin B-12: 567 pg/mL (ref 180–914)

## 2016-06-10 LAB — SURGICAL PCR SCREEN
MRSA, PCR: NEGATIVE
Staphylococcus aureus: NEGATIVE

## 2016-06-10 LAB — MAGNESIUM: MAGNESIUM: 1.9 mg/dL (ref 1.7–2.4)

## 2016-06-10 LAB — CK: CK TOTAL: 3120 U/L — AB (ref 38–234)

## 2016-06-10 SURGERY — FIXATION, FRACTURE, INTERTROCHANTERIC, WITH INTRAMEDULLARY ROD
Anesthesia: Spinal | Site: Hip | Laterality: Left

## 2016-06-10 MED ORDER — OLANZAPINE 5 MG PO TABS
2.5000 mg | ORAL_TABLET | Freq: Every day | ORAL | Status: DC
  Administered 2016-06-10 – 2016-06-13 (×4): 2.5 mg via ORAL
  Filled 2016-06-10 (×3): qty 1

## 2016-06-10 MED ORDER — IPRATROPIUM-ALBUTEROL 0.5-2.5 (3) MG/3ML IN SOLN
3.0000 mL | Freq: Four times a day (QID) | RESPIRATORY_TRACT | Status: DC
  Administered 2016-06-10: 3 mL via RESPIRATORY_TRACT
  Filled 2016-06-10: qty 3

## 2016-06-10 MED ORDER — IPRATROPIUM-ALBUTEROL 0.5-2.5 (3) MG/3ML IN SOLN: 3.0000 mL | Freq: Four times a day (QID) | RESPIRATORY_TRACT | Status: DC | PRN

## 2016-06-10 MED ORDER — METOCLOPRAMIDE HCL 5 MG PO TABS: 5.0000 mg | ORAL_TABLET | Freq: Three times a day (TID) | ORAL | Status: DC | PRN

## 2016-06-10 MED ORDER — ALBUTEROL SULFATE (2.5 MG/3ML) 0.083% IN NEBU
INHALATION_SOLUTION | RESPIRATORY_TRACT | Status: AC
  Filled 2016-06-10: qty 3

## 2016-06-10 MED ORDER — PHENYLEPHRINE HCL 10 MG/ML IJ SOLN
INTRAVENOUS | Status: DC | PRN
  Administered 2016-06-10: 20 ug/min via INTRAVENOUS

## 2016-06-10 MED ORDER — CEFAZOLIN SODIUM-DEXTROSE 2-4 GM/100ML-% IV SOLN
2.0000 g | Freq: Three times a day (TID) | INTRAVENOUS | Status: AC
  Administered 2016-06-10 – 2016-06-11 (×3): 2 g via INTRAVENOUS
  Filled 2016-06-10 (×3): qty 100

## 2016-06-10 MED ORDER — CEFAZOLIN SODIUM-DEXTROSE 2-4 GM/100ML-% IV SOLN
2.0000 g | INTRAVENOUS | Status: AC
  Administered 2016-06-10: 2 g via INTRAVENOUS
  Filled 2016-06-10 (×2): qty 100

## 2016-06-10 MED ORDER — POVIDONE-IODINE 10 % EX SWAB
2.0000 "application " | Freq: Once | CUTANEOUS | Status: AC
  Administered 2016-06-10: 2 via TOPICAL

## 2016-06-10 MED ORDER — FLUTICASONE PROPIONATE 50 MCG/ACT NA SUSP
2.0000 | Freq: Every day | NASAL | Status: DC
  Administered 2016-06-11 – 2016-06-14 (×3): 2 via NASAL
  Filled 2016-06-10: qty 16

## 2016-06-10 MED ORDER — PHENOL 1.4 % MT LIQD: 1.0000 | OROMUCOSAL | Status: DC | PRN

## 2016-06-10 MED ORDER — 0.9 % SODIUM CHLORIDE (POUR BTL) OPTIME
TOPICAL | Status: DC | PRN
  Administered 2016-06-10: 1000 mL

## 2016-06-10 MED ORDER — ACETAMINOPHEN 650 MG RE SUPP: 650.0000 mg | Freq: Four times a day (QID) | RECTAL | Status: DC | PRN

## 2016-06-10 MED ORDER — ALBUTEROL SULFATE (2.5 MG/3ML) 0.083% IN NEBU
3.0000 mL | INHALATION_SOLUTION | RESPIRATORY_TRACT | Status: DC | PRN
  Administered 2016-06-10: 3 mL via RESPIRATORY_TRACT
  Filled 2016-06-10: qty 3

## 2016-06-10 MED ORDER — PHENYLEPHRINE HCL 10 MG/ML IJ SOLN
INTRAMUSCULAR | Status: DC | PRN
  Administered 2016-06-10: 80 ug via INTRAVENOUS
  Administered 2016-06-10: 120 ug via INTRAVENOUS
  Administered 2016-06-10: 80 ug via INTRAVENOUS
  Administered 2016-06-10: 40 ug via INTRAVENOUS
  Administered 2016-06-10: 80 ug via INTRAVENOUS

## 2016-06-10 MED ORDER — METHOCARBAMOL 500 MG PO TABS
500.0000 mg | ORAL_TABLET | Freq: Four times a day (QID) | ORAL | Status: DC | PRN
  Administered 2016-06-10 – 2016-06-14 (×4): 500 mg via ORAL
  Filled 2016-06-10 (×4): qty 1

## 2016-06-10 MED ORDER — LORATADINE 10 MG PO TABS
10.0000 mg | ORAL_TABLET | Freq: Every day | ORAL | Status: DC
  Administered 2016-06-10 – 2016-06-13 (×4): 10 mg via ORAL
  Filled 2016-06-10 (×4): qty 1

## 2016-06-10 MED ORDER — MIDAZOLAM HCL 2 MG/2ML IJ SOLN
INTRAMUSCULAR | Status: AC
  Filled 2016-06-10: qty 2

## 2016-06-10 MED ORDER — CHLORHEXIDINE GLUCONATE 4 % EX LIQD
60.0000 mL | Freq: Once | CUTANEOUS | Status: AC
  Administered 2016-06-10: 4 via TOPICAL
  Filled 2016-06-10: qty 60

## 2016-06-10 MED ORDER — ONDANSETRON HCL 4 MG/2ML IJ SOLN
INTRAMUSCULAR | Status: DC | PRN
  Administered 2016-06-10: 4 mg via INTRAVENOUS

## 2016-06-10 MED ORDER — PROPOFOL 10 MG/ML IV BOLUS
INTRAVENOUS | Status: DC | PRN
  Administered 2016-06-10: 25 mg via INTRAVENOUS
  Administered 2016-06-10: 80 mg via INTRAVENOUS

## 2016-06-10 MED ORDER — ENOXAPARIN SODIUM 40 MG/0.4ML ~~LOC~~ SOLN
40.0000 mg | SUBCUTANEOUS | Status: DC
  Administered 2016-06-11 – 2016-06-12 (×2): 40 mg via SUBCUTANEOUS
  Filled 2016-06-10 (×3): qty 0.4

## 2016-06-10 MED ORDER — SODIUM CHLORIDE 0.9 % IV SOLN: INTRAVENOUS | Status: DC

## 2016-06-10 MED ORDER — FENTANYL CITRATE (PF) 250 MCG/5ML IJ SOLN
INTRAMUSCULAR | Status: AC
  Filled 2016-06-10: qty 5

## 2016-06-10 MED ORDER — HEPARIN SODIUM (PORCINE) 5000 UNIT/ML IJ SOLN
5000.0000 [IU] | Freq: Three times a day (TID) | INTRAMUSCULAR | Status: AC
  Administered 2016-06-10: 5000 [IU] via SUBCUTANEOUS
  Filled 2016-06-10: qty 1

## 2016-06-10 MED ORDER — PHENYLEPHRINE 40 MCG/ML (10ML) SYRINGE FOR IV PUSH (FOR BLOOD PRESSURE SUPPORT)
PREFILLED_SYRINGE | INTRAVENOUS | Status: AC
  Filled 2016-06-10: qty 10

## 2016-06-10 MED ORDER — MORPHINE SULFATE (PF) 2 MG/ML IV SOLN
0.5000 mg | INTRAVENOUS | Status: DC | PRN
  Administered 2016-06-10 – 2016-06-11 (×2): 0.5 mg via INTRAVENOUS
  Filled 2016-06-10 (×2): qty 1

## 2016-06-10 MED ORDER — ONDANSETRON HCL 4 MG PO TABS: 4.0000 mg | ORAL_TABLET | Freq: Four times a day (QID) | ORAL | Status: DC | PRN

## 2016-06-10 MED ORDER — OXYCODONE HCL 5 MG PO TABS
5.0000 mg | ORAL_TABLET | ORAL | Status: DC | PRN
  Administered 2016-06-10 – 2016-06-13 (×5): 10 mg via ORAL
  Filled 2016-06-10 (×5): qty 2

## 2016-06-10 MED ORDER — BUPIVACAINE IN DEXTROSE 0.75-8.25 % IT SOLN
INTRATHECAL | Status: DC | PRN
  Administered 2016-06-10: 1.2 mL via INTRATHECAL

## 2016-06-10 MED ORDER — LAMOTRIGINE 100 MG PO TABS
100.0000 mg | ORAL_TABLET | Freq: Every day | ORAL | Status: DC
Start: 2016-06-10 — End: 2016-06-14
  Administered 2016-06-11 – 2016-06-14 (×4): 100 mg via ORAL
  Filled 2016-06-10 (×4): qty 1

## 2016-06-10 MED ORDER — ONDANSETRON HCL 4 MG/2ML IJ SOLN
INTRAMUSCULAR | Status: AC
  Filled 2016-06-10: qty 2

## 2016-06-10 MED ORDER — HYDROCODONE-ACETAMINOPHEN 10-325 MG PO TABS: 1.0000 | ORAL_TABLET | Freq: Four times a day (QID) | ORAL | 0 refills | Status: DC | PRN

## 2016-06-10 MED ORDER — ONDANSETRON HCL 4 MG/2ML IJ SOLN: 4.0000 mg | Freq: Four times a day (QID) | INTRAMUSCULAR | Status: DC | PRN

## 2016-06-10 MED ORDER — SODIUM CHLORIDE 0.9 % IV SOLN
INTRAVENOUS | Status: DC
  Administered 2016-06-10: 15:00:00 via INTRAVENOUS

## 2016-06-10 MED ORDER — PROPOFOL 10 MG/ML IV BOLUS
INTRAVENOUS | Status: AC
  Filled 2016-06-10: qty 20

## 2016-06-10 MED ORDER — ACETAMINOPHEN 325 MG PO TABS: 650.0000 mg | ORAL_TABLET | Freq: Four times a day (QID) | ORAL | Status: DC | PRN

## 2016-06-10 MED ORDER — FENTANYL CITRATE (PF) 100 MCG/2ML IJ SOLN: 25.0000 ug | INTRAMUSCULAR | Status: DC | PRN

## 2016-06-10 MED ORDER — ENOXAPARIN SODIUM 40 MG/0.4ML ~~LOC~~ SOLN: 40.0000 mg | Freq: Every day | SUBCUTANEOUS | 0 refills | Status: DC

## 2016-06-10 MED ORDER — SENNOSIDES-DOCUSATE SODIUM 8.6-50 MG PO TABS: 1.0000 | ORAL_TABLET | Freq: Every evening | ORAL | Status: DC | PRN

## 2016-06-10 MED ORDER — METOCLOPRAMIDE HCL 5 MG/ML IJ SOLN: 5.0000 mg | Freq: Three times a day (TID) | INTRAMUSCULAR | Status: DC | PRN

## 2016-06-10 MED ORDER — VENLAFAXINE HCL ER 37.5 MG PO CP24
37.5000 mg | ORAL_CAPSULE | Freq: Every evening | ORAL | Status: DC
  Administered 2016-06-11 – 2016-06-13 (×3): 37.5 mg via ORAL
  Filled 2016-06-10 (×6): qty 1

## 2016-06-10 MED ORDER — LIDOCAINE 2% (20 MG/ML) 5 ML SYRINGE
INTRAMUSCULAR | Status: AC
  Filled 2016-06-10: qty 5

## 2016-06-10 MED ORDER — PANTOPRAZOLE SODIUM 40 MG PO TBEC
40.0000 mg | DELAYED_RELEASE_TABLET | Freq: Two times a day (BID) | ORAL | Status: DC
  Administered 2016-06-10 – 2016-06-14 (×8): 40 mg via ORAL
  Filled 2016-06-10 (×9): qty 1

## 2016-06-10 MED ORDER — ACETAMINOPHEN 325 MG PO TABS
650.0000 mg | ORAL_TABLET | Freq: Four times a day (QID) | ORAL | Status: DC | PRN
  Administered 2016-06-11: 650 mg via ORAL
  Filled 2016-06-10: qty 2

## 2016-06-10 MED ORDER — GABAPENTIN 300 MG PO CAPS
300.0000 mg | ORAL_CAPSULE | Freq: Every day | ORAL | Status: DC
  Administered 2016-06-10 – 2016-06-13 (×4): 300 mg via ORAL
  Filled 2016-06-10 (×4): qty 1

## 2016-06-10 MED ORDER — SODIUM CHLORIDE 0.9 % IV BOLUS (SEPSIS)
500.0000 mL | Freq: Once | INTRAVENOUS | Status: AC
  Administered 2016-06-10: 500 mL via INTRAVENOUS

## 2016-06-10 MED ORDER — LACTATED RINGERS IV SOLN
INTRAVENOUS | Status: DC
  Administered 2016-06-10: 11:00:00 via INTRAVENOUS

## 2016-06-10 MED ORDER — DEXAMETHASONE SODIUM PHOSPHATE 10 MG/ML IJ SOLN
INTRAMUSCULAR | Status: AC
  Filled 2016-06-10: qty 1

## 2016-06-10 MED ORDER — SODIUM CHLORIDE 0.9 % IV SOLN
INTRAVENOUS | Status: DC
  Administered 2016-06-10: 19:00:00 via INTRAVENOUS

## 2016-06-10 MED ORDER — ALUM & MAG HYDROXIDE-SIMETH 200-200-20 MG/5ML PO SUSP: 30.0000 mL | ORAL | Status: DC | PRN

## 2016-06-10 MED ORDER — METHOCARBAMOL 1000 MG/10ML IJ SOLN: 500.0000 mg | Freq: Four times a day (QID) | INTRAVENOUS | Status: DC | PRN

## 2016-06-10 MED ORDER — HYDROCODONE-ACETAMINOPHEN 5-325 MG PO TABS
1.0000 | ORAL_TABLET | Freq: Four times a day (QID) | ORAL | Status: DC | PRN
  Administered 2016-06-10 – 2016-06-14 (×8): 2 via ORAL
  Filled 2016-06-10 (×8): qty 2

## 2016-06-10 MED ORDER — ENSURE ENLIVE PO LIQD
237.0000 mL | Freq: Two times a day (BID) | ORAL | Status: DC
  Administered 2016-06-13: 237 mL via ORAL

## 2016-06-10 MED ORDER — SODIUM CHLORIDE 0.9% FLUSH
3.0000 mL | Freq: Two times a day (BID) | INTRAVENOUS | Status: DC
  Administered 2016-06-10 – 2016-06-13 (×6): 3 mL via INTRAVENOUS

## 2016-06-10 MED ORDER — PROPOFOL 500 MG/50ML IV EMUL
INTRAVENOUS | Status: DC | PRN
  Administered 2016-06-10: 25 ug/kg/min via INTRAVENOUS

## 2016-06-10 MED ORDER — FENTANYL CITRATE (PF) 100 MCG/2ML IJ SOLN
INTRAMUSCULAR | Status: DC | PRN
  Administered 2016-06-10 (×2): 25 ug via INTRAVENOUS

## 2016-06-10 MED ORDER — MENTHOL 3 MG MT LOZG: 1.0000 | LOZENGE | OROMUCOSAL | Status: DC | PRN

## 2016-06-10 MED ORDER — MIDAZOLAM HCL 5 MG/5ML IJ SOLN
INTRAMUSCULAR | Status: DC | PRN
  Administered 2016-06-10: 2 mg via INTRAVENOUS

## 2016-06-10 SURGICAL SUPPLY — 41 items
BIT DRILL SHORT 4.0 (BIT) IMPLANT
BNDG COHESIVE 4X5 TAN NS LF (GAUZE/BANDAGES/DRESSINGS) ×2 IMPLANT
BNDG COHESIVE 6X5 TAN STRL LF (GAUZE/BANDAGES/DRESSINGS) IMPLANT
BNDG GAUZE ELAST 4 BULKY (GAUZE/BANDAGES/DRESSINGS) ×2 IMPLANT
COVER PERINEAL POST (MISCELLANEOUS) ×2 IMPLANT
COVER SURGICAL LIGHT HANDLE (MISCELLANEOUS) ×2 IMPLANT
DRAPE C-ARMOR (DRAPES) ×1 IMPLANT
DRAPE INCISE IOBAN 66X45 STRL (DRAPES) ×2 IMPLANT
DRAPE STERI IOBAN 125X83 (DRAPES) ×2 IMPLANT
DRILL BIT SHORT 4.0 (BIT) ×2
DRSG MEPILEX BORDER 4X4 (GAUZE/BANDAGES/DRESSINGS) ×6 IMPLANT
DRSG MEPILEX BORDER 4X8 (GAUZE/BANDAGES/DRESSINGS) ×2 IMPLANT
DRSG PAD ABDOMINAL 8X10 ST (GAUZE/BANDAGES/DRESSINGS) ×4 IMPLANT
DURAPREP 26ML APPLICATOR (WOUND CARE) ×2 IMPLANT
ELECT REM PT RETURN 9FT ADLT (ELECTROSURGICAL) ×2
ELECTRODE REM PT RTRN 9FT ADLT (ELECTROSURGICAL) ×1 IMPLANT
GLOVE SKINSENSE NS SZ7.5 (GLOVE) ×2
GLOVE SKINSENSE STRL SZ7.5 (GLOVE) ×2 IMPLANT
GOWN STRL REIN XL XLG (GOWN DISPOSABLE) ×2 IMPLANT
GUIDE PIN 3.2X343 (PIN) ×1
GUIDE PIN 3.2X343MM (PIN) ×2
KIT BASIN OR (CUSTOM PROCEDURE TRAY) ×2 IMPLANT
KIT ROOM TURNOVER OR (KITS) ×2 IMPLANT
MANIFOLD NEPTUNE II (INSTRUMENTS) ×2 IMPLANT
NAIL LEFT 10X36 (Nail) ×2 IMPLANT
NS IRRIG 1000ML POUR BTL (IV SOLUTION) ×2 IMPLANT
PACK GENERAL/GYN (CUSTOM PROCEDURE TRAY) ×2 IMPLANT
PAD ARMBOARD 7.5X6 YLW CONV (MISCELLANEOUS) ×4 IMPLANT
PAD CAST 4YDX4 CTTN HI CHSV (CAST SUPPLIES) ×2 IMPLANT
PADDING CAST COTTON 4X4 STRL (CAST SUPPLIES) ×4
PIN GUIDE 3.2X343MM (PIN) ×1 IMPLANT
SCREW LAG COMPR KIT 80/75 (Screw) ×2 IMPLANT
SCREW TRIGEN LOW PROF 5.0X35 (Screw) ×1 IMPLANT
STAPLER VISISTAT 35W (STAPLE) ×2 IMPLANT
SUT VIC AB 0 CT1 27 (SUTURE) ×2
SUT VIC AB 0 CT1 27XBRD ANBCTR (SUTURE) ×1 IMPLANT
SUT VIC AB 2-0 CT1 27 (SUTURE) ×2
SUT VIC AB 2-0 CT1 TAPERPNT 27 (SUTURE) ×1 IMPLANT
TOWEL OR 17X24 6PK STRL BLUE (TOWEL DISPOSABLE) ×2 IMPLANT
TOWEL OR 17X26 10 PK STRL BLUE (TOWEL DISPOSABLE) ×2 IMPLANT
WATER STERILE IRR 1000ML POUR (IV SOLUTION) ×2 IMPLANT

## 2016-06-10 NOTE — Anesthesia Postprocedure Evaluation (Addendum)
Anesthesia Post Note  Patient: [name]  Procedure(s) Performed: Procedure(s) (LRB): LEFT FEMUR INTRAMEDULLARY (IM) NAIL INTERTROCHANTRIC (Left)  Patient location during evaluation: PACU Anesthesia Type: Spinal Level of consciousness: awake and alert, awake and sedated Pain management: pain level controlled Vital Signs Assessment: post-procedure vital signs reviewed and stable Respiratory status: spontaneous breathing, nonlabored ventilation, respiratory function stable and patient connected to nasal cannula oxygen Cardiovascular status: blood pressure returned to baseline and stable Postop Assessment: no signs of nausea or vomiting Anesthetic complications: no       Last Vitals:  Vitals:   06/10/16 1415 06/10/16 1430  BP: (!) 147/70 (!) 158/74  Pulse: 98 (!) 103  Resp: 17 20  Temp:      Last Pain:  Vitals:   06/10/16 1400  TempSrc:   PainSc: Asleep                 Earnestene Angello,JAMES TERRILL

## 2016-06-10 NOTE — Anesthesia Procedure Notes (Signed)
Procedure Name: LMA Insertion Date/Time: 06/10/2016 1:09 PM Performed by: Luciana Axe K Pre-anesthesia Checklist: Patient identified, Emergency Drugs available, Suction available and Patient being monitored Patient Re-evaluated:Patient Re-evaluated prior to inductionOxygen Delivery Method: Circle System Utilized Preoxygenation: Pre-oxygenation with 100% oxygen Intubation Type: IV induction Ventilation: Mask ventilation without difficulty LMA: LMA inserted LMA Size: 3.0 Number of attempts: 1 Airway Equipment and Method: Bite block Placement Confirmation: positive ETCO2 and breath sounds checked- equal and bilateral Tube secured with: Tape Dental Injury: Teeth and Oropharynx as per pre-operative assessment

## 2016-06-10 NOTE — Discharge Instructions (Signed)
° ° °  1. Change dressings as needed °2. May shower but keep incisions covered and dry °3. Take lovenox to prevent blood clots °4. Take stool softeners as needed °5. Take pain meds as needed ° °

## 2016-06-10 NOTE — Progress Notes (Addendum)
Subjective: [name] reports continued left hip pain this morning. She reports having no memory of the fall that occurred at home. She is scheduled to go to the OR for femur fixation today.   Objective: Vital signs in last 24 hours: Vitals:   06/09/16 2200 06/09/16 2334 06/10/16 0000 06/10/16 0130  BP: 135/67 (!) 142/68 (!) 130/97 (!) 135/53  Pulse:  99 (!) 114 (!) 102  Resp: 14 13 (!) 22 18  Temp:    98.1 F (36.7 C)  TempSrc:    Axillary  SpO2:  100% 99% 98%    Intake/Output Summary (Last 24 hours) at 06/10/16 0746 Last data filed at 06/10/16 0600  Gross per 24 hour  Intake          2129.17 ml  Output                0 ml  Net          2129.17 ml    Physical Exam General appearance: Cachectic elderly woman with cervical collar laying quietly in bed, in mild distress HENT: Normocephalic, atraumatic Cardiovascular: Regular rate and rhythm, no murmurs, rubs, gallops Respiratory/Chest: Clear to ausculation anterioraly, normal work of breathing Abdomen: Soft, non-tender, non-distended Skin: Warm, dry, scattered bruising, decreased skin turgor Extremities:Thin bulk, no edema, left leg shortened and externally rotated Psych: Appropriate affect  Labs / Imaging / Procedures: CBC Latest Ref Rng & Units 06/10/2016 06/09/2016 06/09/2016  WBC 4.0 - 10.5 K/uL 16.4(H) - 18.1(H)  Hemoglobin 12.0 - 15.0 g/dL 10.7(L) 11.9(L) 11.0(L)  Hematocrit 36.0 - 46.0 % 34.9(L) 35.0(L) 35.3(L)  Platelets 150 - 400 K/uL 387 - 408(H)   BMP Latest Ref Rng & Units 06/10/2016 06/09/2016 06/09/2016  Glucose 65 - 99 mg/dL 89 98 101(H)  BUN 6 - 20 mg/dL 14 20 19   Creatinine 0.44 - 1.00 mg/dL 0.54 0.50 0.53  BUN/Creat Ratio 11 - 26 - - -  Sodium 135 - 145 mmol/L 146(H) 141 142  Potassium 3.5 - 5.1 mmol/L 4.3 3.0(L) 3.2(L)  Chloride 101 - 111 mmol/L 105 100(L) 101  CO2 22 - 32 mmol/L 31 - 29  Calcium 8.9 - 10.3 mg/dL 8.7(L) - 9.1   Assessment/Plan: [name] is a 66 y.o. woman with PMH chronic  pain, depression, bipolar disorder, osteoporosis with bisphosphonate noncompliance admitted for fall with prolonged down time resulting in left hip fracture and rhabdomyolysis.    Principal Problem:   Closed displaced intertrochanteric fracture of left femur ([place]) Active Problems:   Osteoporosis   Bipolar I disorder, most recent episode depressed ([place])   COPD (chronic obstructive pulmonary disease) with emphysema ([facility])  Closed displaced intertrochanteric fracture of left femur, fell at home and found down for unknown period of time (suspect days given rhabdo, incontinence, dehydration), does not remember falling or how she was feeling prior. Has history of frequent falls and fragility fractures. Has history of osteoporosis but has not been compliant with weekly bisphosphonates. Imaging revealed chronic/old C3 fracture. Has only taking care of herself 1 week, previous resided at [facility]. Takes scheduled opioids, from [facility] per patient but no opioid Rx visible on [facility] database since 2016, denies alcohol use. Appears relatively comfortable on exam this morning. -- Planned for internal fixation surgery with ortho today, appreciate ortho recs -- Morphine 1 mg Q3H PRN for pain control  -- Will call and confirm home opiate regimen with [facility], consider restarting for improved pain control -- Following surgery will need PT/OT/SNF and  prolonged DVT ppx -- Telemetry  Rhabdomyolysis, CK elevated to 3700 on admission, due to prolonged time down after fall and hip fracture at home. CK is trending down with IVF, 3100 this AM. Urine is consistent, showing moderate Hgb and only 0-5 RBCs. No renal dysfunction with normal BUN and creatinine. Has received 1.5L bolus. -- Continue IV ns 125 ml/hr -- Recheck CK and BMP tomorrow  Leukocytosis, WBC 18.1 on admit, now 16.4, neutrophil predominant, unclear etiology. Patient remains afebrile, U/a with no evidence of UTI, no cough or infiltrate on CXR -- Trend CBC  and monitor for s/s infection  Malnutrition, cachectic and weights only 92 lbs, albumin 3.2, suspect very poor nutritional status. Vit B12 normal, folate pending. -- Nutrition consult  Bipolar disorder -- continue home Venlafaxine, Lamictal, and Zyprexa  -- Follow Lamictal level  COPD, oxygen dependent 3L at home, no PFTs in system although has been ordered multiple times. SpO2 100% all night on 3 L, which is excessive. -- Duoneb Q6h PRN and Albuterol Q4h PRN -- Continue supplemental O2 2L, maintain SpO2 > 92%  Chronic back pain, reports using hydrocodone every 6 hours scheduled at home, receives this via [facility] but this is not visible in [facility] database -- Continue home Gabapentin 300 mg QHS  Right thyroid nodule, 14 mm, normal TSH -- Outpatient surveillance  DVT heparin TID Diet NPO pending surgery DNR  Dispo: Anticipated discharge in approximately 3-4 day(s).   LOS: 1 day   Asencion Partridge, MD 06/10/2016, 7:46 AM Pager: [phone]

## 2016-06-10 NOTE — ED Notes (Signed)
Delay in lab draw,  Pt in MRI. 

## 2016-06-10 NOTE — Consult Note (Signed)
ORTHOPAEDIC CONSULTATION  REQUESTING PHYSICIAN: Aldine Contes, MD  Chief Complaint: Left intertroch hip fracture  HPI: [name] is a 66 y.o. female who presents with left hip fracture s/p mechanical fall 2 days ago was found down.  The patient endorses severe pain in the left hip, that does not radiate, grinding in quality, worse with any movement, better with immobilization.  Denies LOC/fever/chills/nausea/vomiting.  Walks with assistive devices (walker, cane, wheelchair).  Does live independently.  Denies LOC, neck pain, abd pain.  Past Medical History:  Diagnosis Date  . Anemia   . Anxiety   . Arthritis    "all in my body, primarily left hip" (11/11/2014)  . Bipolar disorder ([place])   . Chronic lower back pain   . Chronic pain syndrome    follows at [facility]  . COPD (chronic obstructive pulmonary disease) (HCC)    oxygen dependent (3L home continuous)  . DEFICIENCY, VITAMIN D NOS 01/03/2007  . Depression   . Duodenal ulcer, chronic   . Elevated liver function tests   . History of blood transfusion 03/2014; 10/2014   "low HgB" (11/11/2014)  . Hyperthyroidism    "borderline"  . On home oxygen therapy    "3L; 24/7" (8/30//2016)  . OSTEOPOROSIS 06/17/2009   DEXA 05/2009 : L femur -2.9; R femur -2.5. Alendronate on med list but not taking. Needs addressed ASAP as h/o fractures.    . Pneumonia X 1?  . Sciatic pain   . Shortness of breath    "all the time" (02/27/2013)  . Substance abuse     narcotics, alcohol, tobacco  . Suicidal ideation 2007    attempted overdose 2012  Dr. Tivis Ringer report   Past Surgical History:  Procedure Laterality Date  . ANKLE DEBRIDEMENT Left 10/2010  . AUGMENTATION MAMMAPLASTY  ~ 2007  . CESAREAN SECTION  1974; 1979  . ESOPHAGOGASTRODUODENOSCOPY N/A 11/07/2014   Procedure: ESOPHAGOGASTRODUODENOSCOPY (EGD);  Surgeon: Irene Shipper, MD;  Location: [facility] ENDOSCOPY;  Service: Endoscopy;  Laterality: N/A;  . FRACTURE SURGERY    . ORIF  ANKLE FRACTURE Left 10/2010  . TUBAL LIGATION  1979   Social History   Social History  . Marital status: Divorced    Spouse name: N/A  . Number of children: 2  . Years of education: N/A   Occupational History  . retired Unemployed   Social History Main Topics  . Smoking status: Current Every Day Smoker    Packs/day: 0.50    Years: 45.00    Types: Cigarettes  . Smokeless tobacco: Never Used     Comment: 11/11/2014 ""smoking 1 cigarettes/day; that's down from 2 ppd"  . Alcohol use 0.0 oz/week     Comment: 11/11/2014 "nothing in 7 years; never had a problem w/it"  . Drug use: No  . Sexual activity: No   Other Topics Concern  . 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 Myelodysplastic syndrome Father     Died from myelofibrosis though diagnosed post-mortem  . Stroke Neg Hx   . Cancer Neg Hx    Allergies  Allergen Reactions  . Banana Shortness Of Breath  and Nausea And Vomiting  . Lyrica [Pregabalin] Other (See Comments)    "Disorients me"  . Pollen Extract Other (See Comments)    Seasonal    Prior to Admission medications   Medication Sig Start Date End Date Taking? Authorizing Provider  alendronate (FOSAMAX) 70 MG tablet Take 1 tablet (70 mg total) by mouth once a week. Take with a full glass of water on an empty stomach. 10/16/14  Yes Rushil Sherrye Payor, MD  Cholecalciferol 1000 units TBDP Take 1,000 mg by mouth daily.   Yes Historical Provider, MD  fluticasone (FLONASE) 50 MCG/ACT nasal spray Place 2 sprays into both nostrils daily.   Yes Historical Provider, MD  Fluticasone-Salmeterol (ADVAIR DISKUS) 250-50 MCG/DOSE AEPB INHALE 1 PUFF INTO THE LUNGS 2 TIMES DAILY 03/19/14  Yes Jones Bales, MD  gabapentin (NEURONTIN) 300  MG capsule Take 300 mg by mouth at bedtime.  06/25/14  Yes Historical Provider, MD  guaiFENesin-dextromethorphan (ROBITUSSIN DM) 100-10 MG/5ML syrup Take 5 mLs by mouth every 4 (four) hours as needed for cough. 02/04/15  Yes Rushil Sherrye Payor, MD  HYDROcodone-acetaminophen (NORCO/VICODIN) 5-325 MG tablet Take 1 tablet by mouth every 6 (six) hours as needed for moderate pain or severe pain. 12/15/15  Yes Lajean Saver, MD  ipratropium-albuterol (DUONEB) 0.5-2.5 (3) MG/3ML SOLN 4 times a day (breakfast, lunch, dinner, bedtime).Can take 2 additional treatments if needed on bad days. DX J43.8 Patient taking differently: Take 3 mLs by mouth 4 (four) times daily.  07/03/14  Yes Kathee Delton, MD  lamoTRIgine (LAMICTAL) 100 MG tablet TAKE ONE TABLET EVERY DAY 09/23/14  Yes Sid Falcon, MD  levocetirizine (XYZAL) 5 MG tablet Take 5 mg by mouth at bedtime.    Yes Historical Provider, MD  Melatonin 10 MG TABS Take 1 tablet by mouth at bedtime as needed. Patient taking differently: Take 1 tablet by mouth at bedtime.  05/26/15  Yes Bartholomew Crews, MD  meloxicam (MOBIC) 7.5 MG tablet Take 7.5 mg by mouth daily.   Yes Historical Provider, MD  OLANZapine (ZYPREXA) 2.5 MG tablet Take 2.5 mg by mouth at bedtime.   Yes Historical Provider, MD  OXYGEN Inhale 3 L into the lungs continuous.   Yes Historical Provider, MD  pantoprazole (PROTONIX) 40 MG tablet Take 1 tablet (40 mg total) by mouth 2 (two) times daily. 11/09/14  Yes Burgess Estelle, MD  polyethylene glycol powder (GLYCOLAX/MIRALAX) powder TAKE 17G DISSOLVED IN 8OZ WATER DAILY ASNEEDED FOR MODERATE CONSTIPATION 09/23/14  Yes Sid Falcon, MD  PROAIR HFA 108 (90 BASE) MCG/ACT inhaler INHALE ONE PUFF BY MOUTH EVERY 2 TO 4 HOURS AS NEEDED FOR WHEEZING 05/21/14  Yes Rushil Sherrye Payor, MD  senna (SENOKOT) 8.6 MG TABS tablet Take 2 tablets (17.2 mg total) by mouth 3 (three) times daily as needed for mild constipation. 11/13/14  Yes Jule Ser, DO  Spacer/Aero-Holding  Chambers (AEROCHAMBER Z-STAT PLUS/MEDIUM) inhaler Use with rescue inhaler 05/08/15  Yes Zada Finders, MD  venlafaxine XR (EFFEXOR-XR) 37.5 MG 24 hr capsule Take 37.5 mg by mouth every evening. 10/24/15  Yes Historical Provider, MD  white petrolatum (VASELINE) GEL Apply 1 application topically daily. To both feet for dry skin   Yes Historical Provider, MD  zolpidem (AMBIEN) 5 MG tablet Take 5 mg by mouth at bedtime as needed for sleep.   Yes Historical Provider, MD   Dg Chest 1 View  Result Date: 06/09/2016 CLINICAL DATA:  Status post fall, with concern for chest injury. Left superior  shoulder bruising. Initial encounter. EXAM: CHEST 1 VIEW COMPARISON:  Chest radiograph and left rib radiographs performed 12/18/2015 FINDINGS: The lungs are hyperexpanded, with underlying emphysema and large upper lobe bulla. There is no evidence of focal opacification, pleural effusion or pneumothorax. The cardiomediastinal silhouette is within normal limits. No acute osseous abnormalities are seen. IMPRESSION: Findings of COPD, with underlying emphysema and large upper lobe bulla. No displaced rib fracture seen. Electronically Signed   By: Roanna Raider M.D.   On: 06/09/2016 19:50   Dg Thoracic Spine 2 View  Result Date: 06/09/2016 CLINICAL DATA:  Status post fall, with upper back pain. Initial encounter. EXAM: THORACIC SPINE 2 VIEWS COMPARISON:  Thoracic spine radiographs performed 12/18/2015 FINDINGS: There is no evidence of fracture or subluxation. Vertebral bodies demonstrate normal height and alignment. Intervertebral disc spaces are preserved. The visualized portions of both lungs are clear. The mediastinum is unremarkable in appearance. IMPRESSION: No evidence of fracture or subluxation along the thoracic spine. Electronically Signed   By: Roanna Raider M.D.   On: 06/09/2016 19:59   Dg Lumbar Spine Complete  Result Date: 06/09/2016 CLINICAL DATA:  Status post fall, with lower back pain. Initial encounter. EXAM:  LUMBAR SPINE - COMPLETE 4+ VIEW COMPARISON:  Lumbar spine radiographs performed 12/18/2015 FINDINGS: There is no evidence of acute fracture or subluxation. Vertebral bodies demonstrate normal height. There is mild grade 1 anterolisthesis of L4 on L5. Intervertebral disc spaces are preserved. The visualized bowel gas pattern is unremarkable in appearance; air and stool are noted within the colon. The sacroiliac joints are within normal limits. IMPRESSION: No evidence of acute fracture or subluxation along the lumbar spine. Electronically Signed   By: Roanna Raider M.D.   On: 06/09/2016 20:00   Dg Pelvis 1-2 Views  Result Date: 06/09/2016 CLINICAL DATA:  Larey Seat today, LEFT body pain. EXAM: PELVIS - 1-2 VIEW COMPARISON:  CT abdomen and pelvis November 04, 2014 FINDINGS: Acute LEFT femur intertrochanteric fracture, with internal rotation, impaction and varus angulation of distal bony fragments. No dislocation. No destructive bony lesions. Osteopenia. Soft tissue planes are nonsuspicious. IMPRESSION: Acute displaced LEFT femur intertrochanteric fracture. No dislocation. Electronically Signed   By: Awilda Metro M.D.   On: 06/09/2016 19:50   Ct Head Wo Contrast  Result Date: 06/09/2016 CLINICAL DATA:  Found down in apartment, weakness EXAM: CT HEAD WITHOUT CONTRAST CT CERVICAL SPINE WITHOUT CONTRAST TECHNIQUE: Multidetector CT imaging of the head and cervical spine was performed following the standard protocol without intravenous contrast. Multiplanar CT image reconstructions of the cervical spine were also generated. COMPARISON:  CT head 12/22/2015, CT cervical spine 12/18/2015 FINDINGS: CT HEAD FINDINGS Brain: Normal ventricular morphology. No midline shift or mass effect. Normal appearance of brain parenchyma. No intracranial hemorrhage, mass lesion or evidence acute infarction. No extra-axial fluid collections. Vascular: Unremarkable Skull: Intact Sinuses/Orbits: Cleared Other: N/A CT CERVICAL SPINE FINDINGS  Alignment: Mild retrolisthesis at C4-C5 unchanged. Remaining alignments normal. Skull base and vertebrae: Visualized skullbase intact. Nondisplaced fractures identified at the anterior and posterior aspects of the LEFT vertebral foramen at C3. The anterior fracture appears corticated while the posterior fracture does not appear corticated, leaking fractures age-indeterminate. These are new since 12/18/2015 however. No additional fracture, dislocation, or bone destruction. Encroachment upon the RIGHT cervical neural foramina at C4-C5 and C5-C6 by uncovertebral spurs. Soft tissues and spinal canal: Prevertebral soft tissues normal thickness. Again identified 14 mm RIGHT thyroid nodule. Low-attenuation material within the RIGHT lateral aspect of the proximal trachea, potentially mucus though  cannot completely exclude low-attenuation mass measuring 13 x 6 x 10 mm with this appearance. Disc levels:  Disc space narrowing C4-C5, C5-C6, C6-C7. Upper chest: Lung apices appear emphysematous but clear Other: N/A IMPRESSION: Generalized atrophy. No acute intracranial abnormalities. Age-indeterminate fractures at the anterior and posterior walls of the LEFT vertebral foramen at C3, though new since 12/18/2015 ; acuity of these fractures could be assessed by MR. Scattered degenerative changes of the cervical spine with encroachment upon the RIGHT cervical neural foramina at C4-C5 and C5-C6. Persistent 14 mm RIGHT thyroid nodule; unless previously assessed, recommend followup nonemergent thyroid ultrasound evaluation. Intraluminal low-attenuation material RIGHT lateral aspect of trachea potentially mucus though cannot exclude tracheal mass with this appearance. Findings discussed with Dr. Tyrone Nine on 06/10/2014 at 2015 hours. Electronically Signed   By: Lavonia Dana M.D.   On: 06/09/2016 20:20   Ct Cervical Spine Wo Contrast  Result Date: 06/09/2016 CLINICAL DATA:  Found down in apartment, weakness EXAM: CT HEAD WITHOUT CONTRAST  CT CERVICAL SPINE WITHOUT CONTRAST TECHNIQUE: Multidetector CT imaging of the head and cervical spine was performed following the standard protocol without intravenous contrast. Multiplanar CT image reconstructions of the cervical spine were also generated. COMPARISON:  CT head 12/22/2015, CT cervical spine 12/18/2015 FINDINGS: CT HEAD FINDINGS Brain: Normal ventricular morphology. No midline shift or mass effect. Normal appearance of brain parenchyma. No intracranial hemorrhage, mass lesion or evidence acute infarction. No extra-axial fluid collections. Vascular: Unremarkable Skull: Intact Sinuses/Orbits: Cleared Other: N/A CT CERVICAL SPINE FINDINGS Alignment: Mild retrolisthesis at C4-C5 unchanged. Remaining alignments normal. Skull base and vertebrae: Visualized skullbase intact. Nondisplaced fractures identified at the anterior and posterior aspects of the LEFT vertebral foramen at C3. The anterior fracture appears corticated while the posterior fracture does not appear corticated, leaking fractures age-indeterminate. These are new since 12/18/2015 however. No additional fracture, dislocation, or bone destruction. Encroachment upon the RIGHT cervical neural foramina at C4-C5 and C5-C6 by uncovertebral spurs. Soft tissues and spinal canal: Prevertebral soft tissues normal thickness. Again identified 14 mm RIGHT thyroid nodule. Low-attenuation material within the RIGHT lateral aspect of the proximal trachea, potentially mucus though cannot completely exclude low-attenuation mass measuring 13 x 6 x 10 mm with this appearance. Disc levels:  Disc space narrowing C4-C5, C5-C6, C6-C7. Upper chest: Lung apices appear emphysematous but clear Other: N/A IMPRESSION: Generalized atrophy. No acute intracranial abnormalities. Age-indeterminate fractures at the anterior and posterior walls of the LEFT vertebral foramen at C3, though new since 12/18/2015 ; acuity of these fractures could be assessed by MR. Scattered  degenerative changes of the cervical spine with encroachment upon the RIGHT cervical neural foramina at C4-C5 and C5-C6. Persistent 14 mm RIGHT thyroid nodule; unless previously assessed, recommend followup nonemergent thyroid ultrasound evaluation. Intraluminal low-attenuation material RIGHT lateral aspect of trachea potentially mucus though cannot exclude tracheal mass with this appearance. Findings discussed with Dr. Tyrone Nine on 06/10/2014 at 2015 hours. Electronically Signed   By: Lavonia Dana M.D.   On: 06/09/2016 20:20   Mr Cervical Spine Wo Contrast  Result Date: 06/10/2016 CLINICAL DATA:  66 y/o F; fall with age-indeterminate C3 vertebral body fractures. EXAM: MRI CERVICAL SPINE WITHOUT CONTRAST TECHNIQUE: Multiplanar, multisequence MR imaging of the cervical spine was performed. No intravenous contrast was administered. COMPARISON:  06/09/2016 cervical spine CT. FINDINGS: Alignment: Physiologic. Vertebrae: No bone marrow edema within the C3 vertebral body or edema in surrounding soft tissues to suggest recent fracture. No acute fracture, evidence of discitis, or bone lesion. Trace facet effusions bilaterally  at the C2-3 and C3-4 levels without bone marrow edema is likely degenerative. Cord: Right T4-5 nerve root sheath cyst.  No abnormal cord signal. Posterior Fossa, vertebral arteries, paraspinal tissues: Negative. Disc levels: C2-3: No significant disc displacement, foraminal narrowing, or canal stenosis. C3-4: Right-sided uncovertebral and facet hypertrophy with mild right foraminal narrowing. Mild canal stenosis. C4-5: Disc osteophyte complex and right greater than left uncovertebral and facet hypertrophy. Moderate to severe right and moderate left foraminal narrowing. Moderate canal stenosis. C5-6: Disc osteophyte complex and right greater than left uncovertebral/facet hypertrophy. Moderate right-greater-than-left foraminal narrowing. Mild canal stenosis. C6-7: Small disc osteophyte complex and right  greater than left uncovertebral hypertrophy. Moderate bilateral foraminal narrowing. No significant canal stenosis. C7-T1: No significant disc displacement, foraminal narrowing, or canal stenosis. IMPRESSION: 1. No bone marrow edema within the C3 vertebral body or edema in surrounding soft tissues to suggest recent fracture. Fracture identified on CT is likely chronic. 2. No other evidence for fracture or ligamentous injury. Normal cervical spine alignment. 3. No abnormal cord signal. 4. Moderate cervical spondylosis greatest at the C4-5 level where there is moderate to severe right/moderate left foraminal narrowing and moderate canal stenosis. Electronically Signed   By: Kristine Garbe M.D.   On: 06/10/2016 01:29   Dg Shoulder Left  Result Date: 06/09/2016 CLINICAL DATA:  Status post fall, with left shoulder bruising. Initial encounter. EXAM: LEFT SHOULDER - 2+ VIEW COMPARISON:  Left shoulder radiographs performed 12/18/2015 FINDINGS: There is no evidence of fracture or dislocation. The left humeral head is seated within the glenoid fossa. The acromioclavicular joint is unremarkable in appearance. No significant soft tissue abnormalities are seen. The visualized portions of the left lung are clear. IMPRESSION: No evidence of fracture or dislocation. Electronically Signed   By: Garald Balding M.D.   On: 06/09/2016 19:51   Dg Knee Complete 4 Views Left  Result Date: 06/09/2016 CLINICAL DATA:  Status post fall, with left knee bruising. Initial encounter. EXAM: LEFT KNEE - COMPLETE 4+ VIEW COMPARISON:  None. FINDINGS: There is no evidence of fracture or dislocation. The joint spaces are preserved. No significant degenerative change is seen; the patellofemoral joint is grossly unremarkable in appearance. No significant joint effusion is seen. The visualized soft tissues are normal in appearance. IMPRESSION: No evidence of fracture or dislocation. Electronically Signed   By: Garald Balding M.D.   On:  06/09/2016 19:52   Dg Humerus Left  Result Date: 06/09/2016 CLINICAL DATA:  Status post fall, with left arm pain. Initial encounter. EXAM: LEFT HUMERUS - 2+ VIEW COMPARISON:  None. FINDINGS: There is no evidence of fracture or dislocation. The left humerus appears intact. The left humeral head remains seated at the glenoid fossa. The elbow joint is incompletely assessed, but appears grossly unremarkable. A peripheral IV catheter is noted at the antecubital fossa. IMPRESSION: No evidence of fracture or dislocation. Electronically Signed   By: Garald Balding M.D.   On: 06/09/2016 19:51   Dg Hand Complete Left  Result Date: 06/09/2016 CLINICAL DATA:  Status post fall, with left hand bruising. Initial encounter. EXAM: LEFT HAND - COMPLETE 3+ VIEW COMPARISON:  Left hand radiographs performed 10/26/2010 FINDINGS: There is no evidence of acute fracture or dislocation. There is chronic deformity of the distal third and fourth metacarpals. Mild degenerative change is noted at the first carpometacarpal joint. The carpal rows are otherwise intact, and demonstrate normal alignment. The soft tissues are unremarkable in appearance. IMPRESSION: No evidence of acute fracture or dislocation. Electronically Signed  By: Garald Balding M.D.   On: 06/09/2016 19:54   Dg Femur Min 2 Views Left  Result Date: 06/09/2016 CLINICAL DATA:  Status post fall, with left hip bruising. Initial encounter. EXAM: LEFT FEMUR 2 VIEWS COMPARISON:  None. FINDINGS: There is a comminuted left femoral intertrochanteric fracture, with displaced lesser and greater trochanteric fragments. The left femoral head remains seated at the acetabulum. The distal left femur appears intact. The left sacroiliac joint is grossly unremarkable. No knee joint effusion is identified. IMPRESSION: Comminuted left femoral intertrochanteric fracture, with displaced lesser and greater trochanteric fragments. Electronically Signed   By: Garald Balding M.D.   On:  06/09/2016 19:53    All pertinent xrays, MRI, CT independently reviewed and interpreted  Positive ROS: All other systems have been reviewed and were otherwise negative with the exception of those mentioned in the HPI and as above.  Physical Exam: General: Alert, no acute distress Cardiovascular: No pedal edema Respiratory: No cyanosis, no use of accessory musculature GI: No organomegaly, abdomen is soft and non-tender Skin: No lesions in the area of chief complaint Neurologic: Sensation intact distally Psychiatric: Patient is competent for consent with normal mood and affect Lymphatic: No axillary or cervical lymphadenopathy  MUSCULOSKELETAL:  - pain with movement of the hip and extremity - skin intact - NVI distally - compartments soft  Assessment: Left intertroch hip fracture  Plan: - surgery is recommended, patient and family are aware of r/b/a and wish to proceed - consent obtained - medical optimization per primary team - surgery is planned for today - Based on history and fracture pattern this likely represents a fragility fracture. - Fragility fractures affect up to one half of women and one third of men after age 64 years and occur in the setting of bone disorder such as osteoporosis or osteopenia and warrant appropriate work-up. - The following are general recommendations that may serve as an outline for an appropriate work-up:  1.) Obtain bone density measurement to confirm presumptive diagnosis, assess severity of osteoporosis and risk of future fracture, and use as baseline for monitoring treatment  2.) Obtain laboratory tests: CBC, ESR, serum calcium, creatinine, albumin,phosphate, alkaline phosphatase, liver transaminases, protein electrophoresis, urinalysis, 25-hydroxyvitamin D.  3.) Exclude secondary causes of low bone mass and skeletal fragility (eg,multiple myeloma, lymphoma) as indicated.  4.) Obtain radiograph of thoracic and lumbar spine, particularly  among individuals with back pain or height loss to assess presence of vertebral fractures  5.) Intermittent administration of recombinant human parathyroid hormone  6.) Optimize nutritional status using nutritional supplementation.  7.) Patient/family education to prevent future falls.  8.) Early mobilization and exercise program - exercise decreases the rate of bone loss and has been associated with decreased rate of fragility fractures   Thank you for the consult and the opportunity to see [name]. Eduard Roux, MD [place] 7:28 AM

## 2016-06-10 NOTE — ED Notes (Signed)
Patient transported to MRI 

## 2016-06-10 NOTE — Anesthesia Procedure Notes (Signed)
Spinal  Patient location during procedure: OR Start time: 06/10/2016 12:25 PM End time: 06/10/2016 12:40 PM Staffing Anesthesiologist: Rica Koyanagi Performed: anesthesiologist  Preanesthetic Checklist Completed: patient identified, surgical consent, pre-op evaluation, timeout performed, IV checked, risks and benefits discussed and monitors and equipment checked Spinal Block Patient position: left lateral decubitus Prep: Betadine and site prepped and draped Patient monitoring: heart rate, cardiac monitor, blood pressure and continuous pulse ox Approach: left paramedian Location: L3-4 Injection technique: single-shot Needle Needle type: Quincke  Needle gauge: 22 G Needle length: 9 cm Needle insertion depth: 4 cm Assessment Sensory level: T8

## 2016-06-10 NOTE — Progress Notes (Signed)
Cancellation Note:  Pt is in surgery. Will follow-up once cleared by MD.   Scheryl Marten PT, DPT  [phone]

## 2016-06-10 NOTE — Anesthesia Preprocedure Evaluation (Addendum)
Anesthesia Evaluation  Patient identified by MRN, date of birth, ID band Patient awake    Reviewed: Allergy & Precautions, NPO status , Patient's Chart, lab work & pertinent test results  Airway Mallampati: II   Neck ROM: Limited  Mouth opening: Limited Mouth Opening Comment: Cervical collar Dental no notable dental hx. (+) Poor Dentition, Dental Advisory Given   Pulmonary shortness of breath, COPD,  COPD inhaler and oxygen dependent, Current Smoker,    breath sounds clear to auscultation       Cardiovascular + dysrhythmias  Rhythm:Regular Rate:Tachycardia     Neuro/Psych PSYCHIATRIC DISORDERS Anxiety Depression Bipolar Disorder    GI/Hepatic PUD, GERD  ,  Endo/Other    Renal/GU      Musculoskeletal  (+) Arthritis ,   Abdominal   Peds  Hematology  (+) anemia ,   Anesthesia Other Findings   Reproductive/Obstetrics                           Anesthesia Physical Anesthesia Plan  ASA: III  Anesthesia Plan: General   Post-op Pain Management:    Induction: Intravenous  Airway Management Planned: LMA  Additional Equipment:   Intra-op Plan:   Post-operative Plan: Extubation in OR  Informed Consent: I have reviewed the patients History and Physical, chart, labs and discussed the procedure including the risks, benefits and alternatives for the proposed anesthesia with the patient or authorized representative who has indicated his/her understanding and acceptance.     Plan Discussed with: CRNA  Anesthesia Plan Comments:        Anesthesia Quick Evaluation

## 2016-06-10 NOTE — ED Notes (Signed)
[name] Sister's # [phone]

## 2016-06-10 NOTE — Transfer of Care (Signed)
Immediate Anesthesia Transfer of Care Note  Patient: [name]  Procedure(s) Performed: Procedure(s): LEFT FEMUR INTRAMEDULLARY (IM) NAIL INTERTROCHANTRIC (Left)  Patient Location: PACU  Anesthesia Type:General  Level of Consciousness: awake, oriented and patient cooperative  Airway & Oxygen Therapy: Patient Spontanous Breathing and Patient connected to nasal cannula oxygen  Post-op Assessment: Report given to RN, Post -op Vital signs reviewed and stable and Patient moving all extremities  Post vital signs: Reviewed and stable  Last Vitals:  Vitals:   06/10/16 0800 06/10/16 1000  BP: (!) 160/67 (!) 176/67  Pulse: (!) 107 (!) 104  Resp: 16 16  Temp: 37.1 C 37 C    Last Pain:  Vitals:   06/10/16 0800  TempSrc: Oral  PainSc:       Patients Stated Pain Goal: 4 (03/75/43 6067)  Complications: No apparent anesthesia complications

## 2016-06-10 NOTE — Progress Notes (Signed)
Initial Nutrition Assessment  DOCUMENTATION CODES:   Not applicable  INTERVENTION:  Once diet advances, provide Ensure Enlive po BID, each supplement provides 350 kcal and 20 grams of protein.  Recommend obtaining new weight to fully assess weight trends.   NUTRITION DIAGNOSIS:   Increased nutrient needs related to chronic illness as evidenced by estimated needs.  GOAL:   Patient will meet greater than or equal to 90% of their needs  MONITOR:   Supplement acceptance, Diet advancement, Labs, Weight trends, Skin, I & O's  REASON FOR ASSESSMENT:   Consult Assessment of nutrition requirement/status  ASSESSMENT:   66 y.o. lady,with PMHx significant for COPD, osteoporosis, chronic pain and bipolar disorder brought to ED via EMS after having a fall. Pt with displaced intertrochanteric fracture of left femur and a fracture of C3.  Procedure (3/30): LEFT FEMUR INTRAMEDULLARY (IM) NAIL INTERTROCHANTRIC (Left)  Pt was unavailable in OR during attempted time of visit. RD unable to obtain nutrition history. Noted no recent weight recorded. Recommend obtaining new weight to fully assess weight trends. RD to order nutritional supplements to aid in post op healing. Nursing staff to provide once diet advances. Unable to complete Nutrition-Focused physical exam at this time. RD to perform at next visit.   Labs and medications reviewed. Sodium elevated at 146.   Diet Order:  Diet NPO time specified  Skin:  Wound (see comment) (Incision on L hip)  Last BM:  3/29  Height:   Ht Readings from Last 1 Encounters:  03/10/16 5\' 1"  (1.549 m)    Weight:   Wt Readings from Last 1 Encounters:  03/10/16 92 lb 1 oz (41.8 kg)    Ideal Body Weight:  47.7 kg  BMI:  There is no height or weight on file to calculate BMI.  Estimated Nutritional Needs:   Kcal:  1400-1600  Protein:  60-70 grams  Fluid:  >/= 1.5 L/day  EDUCATION NEEDS:   No education needs identified at this  time  Corrin Parker, MS, RD, LDN Pager # [phone] After hours/ weekend pager # [phone]

## 2016-06-10 NOTE — ED Notes (Signed)
Delay in lab draw,  Pt not in room 

## 2016-06-10 NOTE — Op Note (Signed)
   Date of Surgery: 06/10/2016  INDICATIONS: [name] is a 66 y.o.-year-old female who sustained a left hip fracture. The risks and benefits of the procedure discussed with the patient prior to the procedure and all questions were answered; consent was obtained.  PREOPERATIVE DIAGNOSIS: left pertrochanteric hip fracture   POSTOPERATIVE DIAGNOSIS: Same   PROCEDURE: Treatment of pertrochanteric fracture with intramedullary implant. CPT [id number]   SURGEON: N. Eduard Roux, M.D.   ANESTHESIA: general   IV FLUIDS AND URINE: See anesthesia record   ESTIMATED BLOOD LOSS: 100 cc  IMPLANTS: [employer] InterTAN 10 x 36, 80/75  DRAINS: None.   COMPLICATIONS: None.   DESCRIPTION OF PROCEDURE: The patient was brought to the operating room and placed supine on the operating table. The patient's leg had been signed prior to the procedure. The patient had the anesthesia placed by the anesthesiologist. The prep verification and incision time-outs were performed to confirm that this was the correct patient, site, side and location. The patient had an SCD on the opposite lower extremity. The patient did receive antibiotics prior to the incision and was re-dosed during the procedure as needed at indicated intervals. The patient was positioned on the fracture table with the table in traction and internal rotation to reduce the hip. The well leg was placed in a scissor position and all bony prominences were well-padded. The patient had the lower extremity prepped and draped in the standard surgical fashion. The incision was made 4 finger breadths superior to the greater trochanter. A guide pin was inserted into the tip of the greater trochanter under fluoroscopic guidance. An opening reamer was used to gain access to the femoral canal. The nail length was measured and inserted down the femoral canal to its proper depth. The appropriate version of insertion for the lag screw was found under fluoroscopy. A pin  was inserted up the femoral neck through the jig. Then, a second antirotation pin was inserted inferior to the first pin. The length of the lag screw was then measured. The lag screw was inserted as near to center-center in the head as possible. The antirotation pin was then taken out and an interdigitating compression screw was placed in its place. The leg was taken out of traction, then the interdigitating compression screw was used to compress across the fracture. Compression was visualized on serial xrays. A single distal interlocking screw was placed using the perfect circle technique.  The upper set screw was tightened down to not allow for any sliding of the lag screws.  The wound was copiously irrigated with saline and the subcutaneous layer closed with 2.0 vicryl and the skin was reapproximated with staples. The wounds were cleaned and dried a final time and a sterile dressing was placed. The hip was taken through a range of motion at the end of the case under fluoroscopic imaging to visualize the approach-withdraw phenomenon and confirm implant length in the head. The patient was then awakened from anesthesia and taken to the recovery room in stable condition. All counts were correct at the end of the case.   POSTOPERATIVE PLAN: The patient will be weight bearing as tolerated and will return in 2 weeks for staple removal and the patient will receive DVT prophylaxis based on other medications, activity level, and risk ratio of bleeding to thrombosis.   Azucena Cecil, MD [place] 1:37 PM

## 2016-06-11 DIAGNOSIS — S12200A Unspecified displaced fracture of third cervical vertebra, initial encounter for closed fracture: Secondary | ICD-10-CM | POA: Diagnosis present

## 2016-06-11 LAB — BASIC METABOLIC PANEL
ANION GAP: 8 (ref 5–15)
Anion gap: 3 — ABNORMAL LOW (ref 5–15)
BUN: 7 mg/dL (ref 6–20)
BUN: 5 mg/dL — AB (ref 6–20)
CALCIUM: 7.6 mg/dL — AB (ref 8.9–10.3)
CHLORIDE: 105 mmol/L (ref 101–111)
CO2: 34 mmol/L — AB (ref 22–32)
CO2: 35 mmol/L — ABNORMAL HIGH (ref 22–32)
CREATININE: 0.47 mg/dL (ref 0.44–1.00)
Calcium: 7.5 mg/dL — ABNORMAL LOW (ref 8.9–10.3)
Chloride: 106 mmol/L (ref 101–111)
Creatinine, Ser: 0.34 mg/dL — ABNORMAL LOW (ref 0.44–1.00)
GFR calc non Af Amer: >60 mL/min (ref >60)
GLUCOSE: 88 mg/dL (ref 65–99)
Glucose, Bld: 194 mg/dL — ABNORMAL HIGH (ref 65–99)
POTASSIUM: 3.2 mmol/L — AB (ref 3.5–5.1)
Potassium: 3.5 mmol/L (ref 3.5–5.1)
SODIUM: 143 mmol/L (ref 135–145)
Sodium: 148 mmol/L — ABNORMAL HIGH (ref 135–145)

## 2016-06-11 LAB — CBC
HEMATOCRIT: 27.6 % — AB (ref 36.0–46.0)
HEMOGLOBIN: 8.2 g/dL — AB (ref 12.0–15.0)
MCH: 25.5 pg — ABNORMAL LOW (ref 26.0–34.0)
MCHC: 29.7 g/dL — ABNORMAL LOW (ref 30.0–36.0)
MCV: 86 fL (ref 78.0–100.0)
Platelets: 322 10*3/uL (ref 150–400)
RBC: 3.21 MIL/uL — AB (ref 3.87–5.11)
RDW: 16.4 % — ABNORMAL HIGH (ref 11.5–15.5)
WBC: 13.5 10*3/uL — AB (ref 4.0–10.5)

## 2016-06-11 LAB — CK: Total CK: 961 U/L — ABNORMAL HIGH (ref 38–234)

## 2016-06-11 LAB — GLUCOSE, CAPILLARY
GLUCOSE-CAPILLARY: 93 mg/dL (ref 65–99)
GLUCOSE-CAPILLARY: 215 mg/dL — AB (ref 65–99)
GLUCOSE-CAPILLARY: 175 mg/dL — AB (ref 65–99)

## 2016-06-11 LAB — VITAMIN D 25 HYDROXY (VIT D DEFICIENCY, FRACTURES): VIT D 25 HYDROXY: 37.3 ng/mL (ref 30.0–100.0)

## 2016-06-11 MED ORDER — IPRATROPIUM-ALBUTEROL 0.5-2.5 (3) MG/3ML IN SOLN
3.0000 mL | RESPIRATORY_TRACT | Status: DC
  Administered 2016-06-11: 3 mL via RESPIRATORY_TRACT
  Filled 2016-06-11: qty 3

## 2016-06-11 MED ORDER — SENNOSIDES-DOCUSATE SODIUM 8.6-50 MG PO TABS
2.0000 | ORAL_TABLET | Freq: Two times a day (BID) | ORAL | Status: DC
  Administered 2016-06-11 – 2016-06-14 (×7): 2 via ORAL
  Filled 2016-06-11 (×7): qty 2

## 2016-06-11 MED ORDER — IPRATROPIUM-ALBUTEROL 0.5-2.5 (3) MG/3ML IN SOLN
3.0000 mL | Freq: Four times a day (QID) | RESPIRATORY_TRACT | Status: DC
  Administered 2016-06-11 – 2016-06-14 (×13): 3 mL via RESPIRATORY_TRACT
  Filled 2016-06-11 (×14): qty 3

## 2016-06-11 MED ORDER — POTASSIUM CHLORIDE CRYS ER 20 MEQ PO TBCR
40.0000 meq | EXTENDED_RELEASE_TABLET | Freq: Once | ORAL | Status: AC
Start: 2016-06-11 — End: 2016-06-11
  Administered 2016-06-11: 40 meq via ORAL
  Filled 2016-06-11: qty 2

## 2016-06-11 MED ORDER — DEXTROSE 5 % IV SOLN: INTRAVENOUS | Status: DC

## 2016-06-11 MED ORDER — POLYETHYLENE GLYCOL 3350 17 G PO PACK: 17.0000 g | PACK | Freq: Every day | ORAL | Status: DC | PRN

## 2016-06-11 NOTE — Evaluation (Signed)
Physical Therapy Evaluation Patient Details Name: [name] MRN: [id number] DOB: June 12, 1950 Today's Date: 06/11/2016   History of Present Illness  Pt is a 66 y/o female admitted after sustaining a fall at home. Pt sustained a L intertrochanteric femur fx and is now s/p IM nail. 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 PMH including but not limited to COPD (dependent on supplemental O2, on 3L at all times), bipolar disorder, depression and substance abuse.  Clinical Impression  Pt presented supine in bed with HOB elevated, awake and willing to participate in therapy session. Prior to admission, pt reported that she ambulated with use of either a SPC or rollator. Pt stated that she has had several falls in the last six months. She currently requires mod-max A x2 for bed mobility and transfers. Pt would continue to benefit from skilled physical therapy services at this time while admitted and after d/c to address the below listed limitations in order to improve overall safety and independence with functional mobility.     Follow Up Recommendations SNF;Supervision/Assistance - 24 hour    Equipment Recommendations  None recommended by PT    Recommendations for Other Services       Precautions / Restrictions Precautions Precautions: Fall;Cervical Required Braces or Orthoses: Cervical Brace Cervical Brace: Hard collar Restrictions Weight Bearing Restrictions: Yes LLE Weight Bearing: Weight bearing as tolerated      Mobility  Bed Mobility Overal bed mobility: Needs Assistance Bed Mobility: Rolling;Sidelying to Sit Rolling: Mod assist;+2 for physical assistance;+2 for safety/equipment Sidelying to sit: Mod assist;+2 for physical assistance       General bed mobility comments: increased time, use of bed rails, verbal and tactile cueing for log roll technique, mod A at pelvis to roll and mod A for trunk elevation to achieve upright sitting at EOB  Transfers Overall  transfer level: Needs assistance Equipment used: Rolling walker (2 wheeled);2 person hand held assist Transfers: Sit to/from [facility] Sit to Stand: Mod assist;+2 physical assistance;+2 safety/equipment Stand pivot transfers: Max assist;+2 physical assistance       General transfer comment: increased time, VC'ing for bilateral hand placement, mod A x2 to stand from bed with RW and with 2 person HHA; max A x2 for pivotal movement from bed to recliner chair  Ambulation/Gait                Stairs            Wheelchair Mobility    Modified Rankin (Stroke Patients Only)       Balance Overall balance assessment: Needs assistance;History of Falls Sitting-balance support: Feet supported;Bilateral upper extremity supported Sitting balance-Leahy Scale: Poor Sitting balance - Comments: pt with constant L lateral lean that required min A to correct; constant close min guard for safety. pt tolerating sitting EOB x10 minutes. Postural control: Left lateral lean Standing balance support: During functional activity;Bilateral upper extremity supported Standing balance-Leahy Scale: Poor Standing balance comment: pt reliant on bilateral UEs on external supports and mod-max A x2                             Pertinent Vitals/Pain Pain Assessment: 0-10 Pain Score: 10-Worst pain ever Pain Location: L hip Pain Descriptors / Indicators: Sore;Grimacing;Guarding Pain Intervention(s): Monitored during session;Repositioned    Home Living Family/patient expects to be discharged to:: Skilled nursing facility  Additional Comments: Pt lives alone in an apartment but is agreeable to ST SNF upon d/c    Prior Function Level of Independence: Independent with assistive device(s)         Comments: pt reported that she ambulates with use of a SPC or rollator     Hand Dominance   Dominant Hand: Right    Extremity/Trunk Assessment    Upper Extremity Assessment Upper Extremity Assessment: Defer to OT evaluation    Lower Extremity Assessment Lower Extremity Assessment: LLE deficits/detail LLE Deficits / Details: Pt with decreased strength and ROM limitations secondary to post-op. Sensation grossly intact. LLE: Unable to fully assess due to pain    Cervical / Trunk Assessment Cervical / Trunk Assessment: Other exceptions Cervical / Trunk Exceptions: pt with chronic C3 fx, hard cervical collar in place  Communication   Communication: No difficulties  Cognition Arousal/Alertness: Awake/alert Behavior During Therapy: WFL for tasks assessed/performed Overall Cognitive Status: Within Functional Limits for tasks assessed                                        General Comments      Exercises     Assessment/Plan    PT Assessment Patient needs continued PT services  PT Problem List Decreased range of motion;Decreased strength;Decreased activity tolerance;Decreased balance;Decreased mobility;Decreased coordination;Decreased knowledge of use of DME;Decreased safety awareness;Decreased knowledge of precautions;Cardiopulmonary status limiting activity;Pain       PT Treatment Interventions DME instruction;Gait training;Stair training;Functional mobility training;Therapeutic activities;Therapeutic exercise;Balance training;Neuromuscular re-education;Patient/family education    PT Goals (Current goals can be found in the Care Plan section)  Acute Rehab PT Goals Patient Stated Goal: go to rehab to get stronger PT Goal Formulation: With patient Time For Goal Achievement: 06/25/16 Potential to Achieve Goals: Good    Frequency Min 2X/week   Barriers to discharge        Co-evaluation PT/OT/SLP Co-Evaluation/Treatment: Yes Reason for Co-Treatment: For patient/therapist safety PT goals addressed during session: Mobility/safety with mobility;Balance;Proper use of DME         End of Session Equipment  Utilized During Treatment: Gait belt;Oxygen Activity Tolerance: Patient limited by pain;Patient limited by fatigue Patient left: in chair;with call bell/phone within reach;with chair alarm set Nurse Communication: Mobility status PT Visit Diagnosis: Unsteadiness on feet (R26.81);Other abnormalities of gait and mobility (R26.89);Pain Pain - Right/Left: Left Pain - part of body: Hip    Time: 7622-6333 PT Time Calculation (min) (ACUTE ONLY): 33 min   Charges:   PT Evaluation $PT Eval Moderate Complexity: 1 Procedure     PT G Codes:        Sherie Don, PT, DPT [place] 06/11/2016, 3:35 PM

## 2016-06-11 NOTE — Discharge Summary (Signed)
Name: [name] MRN: [id number] DOB: 1950-07-01 66 y.o. PCP: Riccardo Dubin, MD  Date of Admission: 06/09/2016  5:19 PM Date of Discharge: 06/14/2016 Attending Physician: Lalla Brothers, MD  Discharge Diagnosis: 1. Closed displaced intertrochanteric fracture of the left femur 2. C3 cervical fracture, chronic 3. Rhabdomyolysis 4. Osteoporosis 5. Chronic back pain 6. COPD 7. Severe protein-calorie malnutrition 8. Constipation  9. Right thyroid nodule   Principal Problem:   Closed displaced intertrochanteric fracture of left femur (HCC) Active Problems:   Chronic back pain   Osteoporosis   Bipolar I disorder, most recent episode depressed (HCC)   COPD (chronic obstructive pulmonary disease) with emphysema (HCC)   Protein-calorie malnutrition, severe (HCC)   Constipation   C3 cervical fracture (HCC)   Right thyroid nodule   Discharge Medications: Allergies as of 06/13/2016      Reactions   Banana Shortness Of Breath, Nausea And Vomiting   Lyrica [pregabalin] Other (See Comments)   "Disorients me"   Pollen Extract Other (See Comments)   Seasonal       Medication List    STOP taking these medications   meloxicam 7.5 MG tablet Commonly known as:  MOBIC   zolpidem 5 MG tablet Commonly known as:  AMBIEN     TAKE these medications   aerochamber Z-Stat Plus/medium inhaler Use with rescue inhaler   alendronate 10 MG tablet Commonly known as:  FOSAMAX Take 1 tablet (10 mg total) by mouth daily before breakfast. Take with a full glass of water on an empty stomach. What changed:  medication strength  how much to take  when to take this   Cholecalciferol 1000 units Tbdp Take 1,000 mg by mouth daily.   enoxaparin 40 MG/0.4ML injection Commonly known as:  LOVENOX Inject 0.4 mLs (40 mg total) into the skin daily. Until 06/20/2016   feeding supplement (ENSURE ENLIVE) Liqd Take 237 mLs by mouth 2 (two) times daily between meals.   fluticasone 50 MCG/ACT  nasal spray Commonly known as:  FLONASE Place 2 sprays into both nostrils daily.   Fluticasone-Salmeterol 250-50 MCG/DOSE Aepb Commonly known as:  ADVAIR DISKUS INHALE 1 PUFF INTO THE LUNGS 2 TIMES DAILY   gabapentin 300 MG capsule Commonly known as:  NEURONTIN Take 300 mg by mouth at bedtime.   guaiFENesin-dextromethorphan 100-10 MG/5ML syrup Commonly known as:  ROBITUSSIN DM Take 5 mLs by mouth every 4 (four) hours as needed for cough.   HYDROcodone-acetaminophen 5-325 MG tablet Commonly known as:  NORCO/VICODIN Take 1-2 tablets by mouth every 4 (four) hours as needed for moderate pain. What changed:  how much to take  when to take this  reasons to take this   ipratropium-albuterol 0.5-2.5 (3) MG/3ML Soln Commonly known as:  DUONEB 4 times a day (breakfast, lunch, dinner, bedtime).Can take 2 additional treatments if needed on bad days. DX J43.8 What changed:  how much to take  how to take this  when to take this  additional instructions   lamoTRIgine 100 MG tablet Commonly known as:  LAMICTAL TAKE ONE TABLET EVERY DAY   levocetirizine 5 MG tablet Commonly known as:  XYZAL Take 5 mg by mouth at bedtime.   Melatonin 10 MG Tabs Take 1 tablet by mouth at bedtime as needed. What changed:  when to take this   OLANZapine 2.5 MG tablet Commonly known as:  ZYPREXA Take 2.5 mg by mouth at bedtime.   OXYGEN Inhale 3 L into the lungs continuous.   pantoprazole 40 MG  tablet Commonly known as:  PROTONIX Take 1 tablet (40 mg total) by mouth 2 (two) times daily.   polyethylene glycol powder powder Commonly known as:  GLYCOLAX/MIRALAX TAKE 17G DISSOLVED IN 8OZ WATER DAILY ASNEEDED FOR MODERATE CONSTIPATION   PROAIR HFA 108 (90 Base) MCG/ACT inhaler Generic drug:  albuterol INHALE ONE PUFF BY MOUTH EVERY 2 TO 4 HOURS AS NEEDED FOR WHEEZING   senna 8.6 MG Tabs tablet Commonly known as:  SENOKOT Take 2 tablets (17.2 mg total) by mouth 3 (three) times daily as  needed for mild constipation.   venlafaxine XR 37.5 MG 24 hr capsule Commonly known as:  EFFEXOR-XR Take 37.5 mg by mouth every evening.   white petrolatum Gel Commonly known as:  VASELINE Apply 1 application topically daily. To both feet for dry skin       Disposition and follow-up:   Ms.Elyzabeth H [name] was discharged from [facility] in Stable condition.  At the hospital follow up visit please address:  1. Closed displaced intertrochanteric fracture of the left femur - assess mobility, adherence with PT, and follow up with orthopedics  C3 cervical fracture, chronic - assess neck mobility  Rhabdomyolysis - assess muscle weakness or tenderness, ensure adequate po intake  Osteoporosis - assess adherence with daily bisphosphonate (history of noncompliance), may require yearly injection form, minimize risk for falls  Chronic back pain - assess pain control, ensure follow up with [facility] for chronic pain  COPD - assess dyspnea, exercise tolerance, frequency of nebulizer treatments, need for PFTs and pulm rehab, any continued tobacco abuse  Severe protein-calorie malnutrition - assess caloric intake, weight (98 lbs at discharge), and adherence with dietary supplements  Constipation - assess BM frequency or lack thereof and titrate bowel regimen  Right thyroid nodule - obtain ultrasound to evaluate 14 mm right thyroid nodule   2.  Labs / imaging needed at time of follow-up: BMP, updated PFTs, thyroid US  3.  Pending labs/ test needing follow-up: None  Follow-up Appointments:  Contact information for follow-up providers    Eduard Roux, MD Follow up in 2 week(s).   Specialty:  Orthopedic Surgery Why:  For suture removal, For wound re-check Contact information: [place] [id number] [place]. Go on 06/27/2016.   Why:  At 2:45 PM, please arrive 15 minutes early to check  in Contact information: [address] [place] [phone]           Contact information for after-discharge care    Destination    [facility] Follow up.   Specialty:  [place] information: [address] [place] [facility] Course by problem list: Principal Problem:   Closed displaced intertrochanteric fracture of left femur ([place]) Active Problems:   Chronic back pain   Osteoporosis   Bipolar I disorder, most recent episode depressed ([place])   COPD (chronic obstructive pulmonary disease) with emphysema (HCC)   Protein-calorie malnutrition, severe (HCC)   Constipation   C3 cervical fracture (HCC)   Right thyroid nodule   1. Closed displaced intertrochanteric fracture of the left femur Ms. Pierceis a 66 y.o. woman with PMH significant for COPD, osteoporosis with frequent falls and fractures, chronic pain on chronic opioids, and bipolar disorder was brought to the ED on 3/29  via EMS after sustaining a fall at home and remaining down for unknown period of time. She could not recall her fall but remembered eating breakfast and taking her morning Norco. She had been previously living in [facility] for 1.5 years and had recently moved to independent apartment of own volition a week prior. Per family she was discovered 2 two days after the fall by an apartment worker and EMS were called. She reported diffuse back pain and left hip pain on admission and numerous imaging studies revealed an acute closed displaced intertrochanteric fracture of her left femur. Orthopedic surgery was consulted and she was taken to the OR for successful ORIF of her fracture via Dr. Erlinda Hong of [facility] on 3/30. Her pain was managed adequately with prn opioid medications and mobilized with physical and occupation therapy. DVT prophylaxis was maintained with Lovenox 40 mg  injection daily, which is planned to continue for 10 days post-op (ending 4/9) - shorter course due to patient's history of upper GI bleed and ulcer. Her pain regimen was weaned and she continued to maintain adequate po intake. She was deemed stable for discharge to SNF on 4/3 with orthopedic surgery and IM clinic follow up.  2. C3 cervical fracture, chronic - due to presenting for unwitnessed fall at home and no memory of the event underwent CT head and cervical spine which revealed age-indeterminate fractures at the anterior and posterior walls of the left vertebral foramen at C3. Her was maintained in a cervical collar until she could be cleared radiologically and clinically. Follow up MRI of her cervical spine on 3/30 determined that the fracture was most likely chronic, with no evidence of ligamentous injury. Her cervical collar was removed and she demonstrated full neck mobility and no pain on 4/1.   3. Rhabdomyolysis - after falling and fracturing her left hip at home she was too weak and in pain to move and remained on the ground for at least two days before a worker in her apartment complex found her and called EMS. On admission she was found to have elevated CK to 3700 and evidence of myoglobinuria but preserved renal function. She received IV fluids for the first two day of her hospitalization and her CK improved to 682, and she has been eating and drinking well since. Her renal function remained normal throughout hospitalization. Her muscles have remained weak and diffusely aching, but this has improved slightly each day.  4. Osteoporosis - has a history of osteoporosis by DEXA imaging of left femoral neck as early as 05/2009, confirmed again with DEXA in 09/2014, and noncompliance with her prescribed bisphosphonates in the past. Numerous falls and fragility fractures in past (L ankle 2012, right clavicle and acromion 12/2015). She seemed surprised to learn of this diagnosis in our discussions with  her at this hospitalization. Alendronate 10 mg daily was started on discharge and should be continued for 5 years.    5. Chronic back pain - [facility] for management of chronic back pain and sciatica. Imaging in past has revealed kyphoscoliosis of her thoracic spine. [facility] was contacted and confirmed her as a longstanding patient. At home she was taking Norco 5-325 mg four times daily and Gabapentin 300 mg daily. Her Gabapentin was continued during hospitalization and her home Norco was increased to 10-650 (2 tablets) in the setting of acute hip fracture. She was provided with a short prescription for this increased dose on discharge, given the  acuity of her pain post-op, after which she should return to her previous longstanding pain regimen.   6. COPD - Gold stage 3 (FEV1 32% pred post and FEV1/FVC 60% per PFTs 08/2012) on home oxygen 3 liters per nasal cannula. Her home Duoneb Q6h scheduled were resumed throughout her hospitalization, as well as her home oxygen. No adjustments were made to her inhalers or nebulizers and these were to be continued on discharge.   7. Severe protein-calorie malnutrition - chronically ill and frail patient with recent fall. Cachectic with significant muscle wasting on exam, BMI 18.7. She was evaluated by dietician and started on Ensure Enlive supplement twice daily between meals.  8. Constipation - reported no BM since 3/29 (prior to arrival) during her hospitalization and had no BM during her hospital stay despite accelerated bowel regimen up to Senna-S 2 tablets BID and Miralax daily. Her constipation is likely due to immobility and opioid pain medications. She denies abdominal pain, distension, or nausea, but did report some loss of appetite. She was encouraged to continue Senna BID and Miralax on discharge.  9. Right thyroid nodule - imaging of her head and cervical spine on admission revealed and incidental 14 mm nodule in her  right thyroid. TSH was checked and within normal limits at 0.7. Follow up ultrasound of this nodule deferred to outpatient setting.   Discharge Vitals:   BP (!) 134/59 (BP Location: Right Arm)   Pulse 91   Temp 98.6 F (37 C) (Oral)   Resp 16   Wt 98 lb 11.2 oz (44.8 kg)   SpO2 (!) 82% Comment: after rolling around for bath  BMI 18.65 kg/m   Pertinent Labs, Studies, and Procedures:  CBC Latest Ref Rng & Units 06/13/2016 06/12/2016 06/11/2016  WBC 4.0 - 10.5 K/uL 8.5 9.8 13.5(H)  Hemoglobin 12.0 - 15.0 g/dL 7.5(L) 7.7(L) 8.2(L)  Hematocrit 36.0 - 46.0 % 25.5(L) 25.5(L) 27.6(L)  Platelets 150 - 400 K/uL 314 291 322   BMP Latest Ref Rng & Units 06/13/2016 06/12/2016 06/11/2016  Glucose 65 - 99 mg/dL 94 103(H) 194(H)  BUN 6 - 20 mg/dL <5(L) 5(L) 5(L)  Creatinine 0.44 - 1.00 mg/dL 0.32(L) 0.31(L) 0.47  BUN/Creat Ratio 11 - 26 - - -  Sodium 135 - 145 mmol/L 146(H) 143 143  Potassium 3.5 - 5.1 mmol/L 3.8 3.7 3.2(L)  Chloride 101 - 111 mmol/L 101 102 105  CO2 22 - 32 mmol/L 38(H) 35(H) 35(H)  Calcium 8.9 - 10.3 mg/dL 7.8(L) 8.0(L) 7.5(L)    Recent Labs Lab 06/09/16 1823 06/10/16 0400 06/11/16 0348 06/12/16 1000  CKTOTAL 3,714* 3,120* 961* 682*   Dg Thoracic Spine 2 View - negative for fracture Dg Lumbar Spine Complete - negative for fracture Dg Shoulder Left - negative for fracture Dg Knee Complete 4 Views Left - negative for fracture Dg Humerus Left - negative for fracture  Dg Hand Complete Left - negative for fracture  Dg Humerus Right IMPRESSION: Old distal right clavicle and acromion fractures. No acute fracture or dislocation seen.  Dg Shoulder Right Port  IMPRESSION: 1. No acute osseous injury of the right shoulder. 2. Chronic ununited fracture of the distal right clavicle with inferior displacement of the distal fracture fragment. 3. Chronic ununited and displaced acromial fracture.  Dg Chest 1 View IMPRESSION: Findings of COPD, with underlying emphysema and large  upper lobe bulla. No displaced rib fracture seen. Electronically Signed   By: Garald Balding M.D.   On: 06/09/2016 19:50   Ct Head  Wo Contrast Ct Cervical Spine Wo Contrast Result Date: 06/09/2016 IMPRESSION: Generalized atrophy. No acute intracranial abnormalities. Age-indeterminate fractures at the anterior and posterior walls of the LEFT vertebral foramen at C3, though new since 12/18/2015 ; acuity of these fractures could be assessed by MR. Scattered degenerative changes of the cervical spine with encroachment upon the RIGHT cervical neural foramina at C4-C5 and C5-C6. Persistent 14 mm RIGHT thyroid nodule; unless previously assessed, recommend followup nonemergent thyroid ultrasound evaluation. Intraluminal low-attenuation material RIGHT lateral aspect of trachea potentially mucus though cannot exclude tracheal mass with this appearance. Findings discussed with Dr. Tyrone Nine on 06/10/2014 at 2015 hours. Electronically Signed   By: Lavonia Dana M.D.   On: 06/09/2016 20:20   Mr Cervical Spine Wo Contrast Result Date: 06/10/2016 IMPRESSION: 1. No bone marrow edema within the C3 vertebral body or edema in surrounding soft tissues to suggest recent fracture. Fracture identified on CT is likely chronic. 2. No other evidence for fracture or ligamentous injury. Normal cervical spine alignment. 3. No abnormal cord signal. 4. Moderate cervical spondylosis greatest at the C4-5 level where there is moderate to severe right/moderate left foraminal narrowing and moderate canal stenosis. Electronically Signed   By: Kristine Garbe M.D.   On: 06/10/2016 01:29   Dg Femur Min 2 Views Left Result Date: 06/10/2016 CLINICAL DATA:  Intertrochanteric left femur fracture. intramedullary nail fixation. EXAM: LEFT FEMUR 2 VIEWS; DG C-ARM 61-120 MIN COMPARISON:  Radiographs 06/09/2016. FLUOROSCOPY TIME: FourC-arm fluoroscopic images were obtained intraoperatively and submitted for post operative interpretation. Please see  the performing provider's procedural report for the fluoroscopy time utilized. FINDINGS: Spot fluoroscopic images demonstrate the placement of a left femoral intramedullary nail and dynamic screws traversing the femoral neck. The comminuted fracture of the intertrochanteric left femur demonstrates near anatomic reduction. There is a distal interlocking screw. No complications are identified. IMPRESSION: Near anatomic reduction of comment intertrochanteric left femur fracture post ORIF. No demonstrated complication. Electronically Signed   By: Richardean Sale M.D.   On: 06/10/2016 13:58   Dg Femur Min 2 Views Left Result Date: 06/09/2016 CLINICAL DATA:  Status post fall, with left hip bruising. Initial encounter. EXAM: LEFT FEMUR 2 VIEWS COMPARISON:  None. FINDINGS: There is a comminuted left femoral intertrochanteric fracture, with displaced lesser and greater trochanteric fragments. The left femoral head remains seated at the acetabulum. The distal left femur appears intact. The left sacroiliac joint is grossly unremarkable. No knee joint effusion is identified. IMPRESSION: Comminuted left femoral intertrochanteric fracture, with displaced lesser and greater trochanteric fragments. Electronically Signed   By: Garald Balding M.D.   On: 06/09/2016 19:53     Discharge Instructions: Discharge Instructions    Diet - low sodium heart healthy    Complete by:  As directed    Discharge instructions    Complete by:  As directed    Please continue working with physical therapy to improve your mobility.   You have fractured your left hip after falling at home. This occurred because you have osteoporosis and may have been too sleepy or weak. We recommend you stop taking Ambien and have removed this from your medication list. You may also need to consider cutting down on the opioid pain medications you are taking in the long run.  We have also prescribed a blood thinner (Lovenox) for the next week to reduce your  risk of developing a blood clot in your legs.  We have provided a short prescription for Hydrocodone-Acetaminophen 5-325 mg 1-2 tablets every  4 hours as need for pain control over the next few days. Afterward you can resume your home pain of Hydrocodone-Acetaminophen 5-325 mg 1 tablet every 6 hours and follow up with [facility].  For your osteoporosis we have prescribed Alendronate 10 mg daily with breakfast. Make sure that you take this medication to reduce your risk for fractures in the future.   You are also very malnourished, we have prescribed Ensure Enlive supplements twice daily between meals.  Please follow up with [place] in 3 weeks and also with your orthopedic surgeons in the near future.   Increase activity slowly    Complete by:  As directed    Weight bearing as tolerated    Complete by:  As directed       Signed: Asencion Partridge, MD 06/13/2016, 2:47 PM   Pager: [phone]

## 2016-06-11 NOTE — Progress Notes (Addendum)
Subjective: [name] reports a great amount of pain this morning, did not receive much PRN pain medication last night (only 10 mg Oxy at 10pm). She also is requesting that her home schedule nebulizer treatments Q4h be restarted, this was ordered PRN initially and she is feeling more short of breath. She states she is hungry and thirsty but is having difficulty using her arms to feed herself as they are weak and ache all over. She also reports mild abdominal pain and has had no BM in 3 days. Her sister/HCPOA [name] is at bedside and attentive to her needs.  Telemetry reviewed - few PVCs and sinus tach to 120s - dc  Objective: Vital signs in last 24 hours: Vitals:   06/10/16 1529 06/10/16 2005 06/11/16 0205 06/11/16 0708  BP: (!) 145/61 (!) 138/52 (!) 138/47 134/62  Pulse: (!) 105 (!) 115 (!) 111 (!) 116  Resp: 16 16    Temp: 97.7 F (36.5 C) 99.4 F (37.4 C) 98.8 F (37.1 C) 100 F (37.8 C)  TempSrc: Axillary Oral Oral Oral  SpO2: 93% 95% 97% 93%    Intake/Output Summary (Last 24 hours) at 06/11/16 0737 Last data filed at 06/11/16 0600  Gross per 24 hour  Intake          2656.09 ml  Output             1450 ml  Net          1206.09 ml   Physical Exam General appearance: Cachectic elderly woman with cervical collar laying in bed, in mild distress, soft spoken HENT: Normocephalic, atraumatic, dry mucous membranes, poor dentition Cardiovascular: Tachycardic rate and regular rhythm, no murmurs, rubs, gallops Respiratory/Chest: Clear to ausculation anterioraly, normal work of breathing Abdomen: Soft, mildly tender, non-distended Skin: Warm, dry, scattered bruising, decreased skin turgor Extremities:Thin bulk, no edema, bandaged left hip, muscles tender to palpation Psych: Blunted affect  Labs / Imaging / Procedures: CBC Latest Ref Rng & Units 06/11/2016 06/10/2016 06/09/2016  WBC 4.0 - 10.5 K/uL 13.5(H) 16.4(H) -  Hemoglobin 12.0 - 15.0 g/dL 8.2(L) 10.7(L) 11.9(L)  Hematocrit 36.0  - 46.0 % 27.6(L) 34.9(L) 35.0(L)  Platelets 150 - 400 K/uL 322 387 -   BMP Latest Ref Rng & Units 06/11/2016 06/10/2016 06/09/2016  Glucose 65 - 99 mg/dL 88 89 98  BUN 6 - 20 mg/dL 7 14 20   Creatinine 0.44 - 1.00 mg/dL 0.34(L) 0.54 0.50  BUN/Creat Ratio 11 - 26 - - -  Sodium 135 - 145 mmol/L 148(H) 146(H) 141  Potassium 3.5 - 5.1 mmol/L 3.5 4.3 3.0(L)  Chloride 101 - 111 mmol/L 106 105 100(L)  CO2 22 - 32 mmol/L 34(H) 31 -  Calcium 8.9 - 10.3 mg/dL 7.6(L) 8.7(L) -   Assessment/Plan: [name] is a 66 y.o. woman with PMH chronic pain, depression, bipolar disorder, osteoporosis with bisphosphonate noncompliance admitted for fall with prolonged down time resulting in left hip fracture and rhabdomyolysis.    Principal Problem:   Closed displaced intertrochanteric fracture of left femur (HCC) Active Problems:   Osteoporosis   Bipolar I disorder, most recent episode depressed ([place])   COPD (chronic obstructive pulmonary disease) with emphysema (HCC)  Closed displaced intertrochanteric fracture of left femur, fell at home and found down for approx 2 days. Has history of frequent falls and fragility fractures. Previously resided at [facility]. Takes scheduled opioids, from [place] [facility] per patient but no opioid Rx visible on [place] database since 2016, denies alcohol  use. Underwent intramedullary implant / ORIF on 3/30 via Dr. Erlinda Hong. -- Appreciate ortho recs -- Norco 5-325 mg 1-2 tab Q6h PRN -- Oxycodone 5-10 mg Q4h PRN -- Morphine 0.5 mg Q2h PRN -- PT/OT  Rhabdomyolysis, improving,CK 960 this AM from 3700 on admission, due to prolonged time down after fall and hip fracture at home. No renal dysfunction with normal BUN and creatinine. Diffuse myalgia and tenderness to palpation. -- Encourage po intake  Hypernatremia, Na 148 from 141 on admission, received IV NS, net +3.5 L  -- Follow PM BMP, encourage po and free water intake  Constipation, reports no BM in three days and on multiple  opioid medications -- Increase Senna-S to 2 tablets BID -- Miralax QD PRN  Leukocytosis, WBC 18.1 on admit, now 13.5, neutrophil predominant, unclear etiology. Patient remains afebrile, U/a with no evidence of UTI, no cough or infiltrate on CXR -- Trend CBC and monitor for s/s infection  Malnutrition, cachectic and weights only 92 lbs, albumin 3.2, suspect very poor nutritional status. Vit B12 normal, folate pending. -- Nutrition consulted, appreciate recs -- Ensure BID between meals  Bipolar disorder -- continue home Venlafaxine, Lamictal, and Zyprexa   COPD, oxygen dependent 3L at home, no PFTs in system  -- Duoneb Q4h scheduled -- Albuterol Q4h PRN -- Continue supplemental O2 2-3L, maintain SpO2 > 92%  Chronic back pain, reports using hydrocodone every 6 hours scheduled at home, receives this via [facility] but this is not visible in [place] database -- home Gabapentin 300 mg QHS  -- pain regimen per above  Right thyroid nodule, 14 mm, normal TSH -- Outpatient surveillance  DVT Lovenox Diet HH DNR  Dispo: Anticipated discharge in approximately 3-4 day(s).   LOS: 2 days   Asencion Partridge, MD 06/11/2016, 7:37 AM Pager: [phone]

## 2016-06-11 NOTE — Care Management Note (Signed)
66 yo F sustained a L intertrochanteric femur fx after falling at home. s/p IM nail.   Received referral to assist with [facility] and DME.  PT is recommending SNF. SW to f/u to assist with SNF placement.

## 2016-06-11 NOTE — Evaluation (Signed)
Occupational Therapy Evaluation Patient Details Name: [name] MRN: [id number] DOB: 04-13-1950 Today's Date: 06/11/2016    History of Present Illness Pt is a 66 y/o female admitted after sustaining a fall at home. Pt sustained a L intertrochanteric femur fx and is now s/p IM nail. 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 PMH including but not limited to COPD (dependent on supplemental O2, on 3L at all times), bipolar disorder, depression and substance abuse.   Clinical Impression   PTA, pt was independent with Rollator or SPC for ADL and functional mobility and was living alone. She currently requires max +2 assist for toilet transfers and max assist for LB ADL. Pt additionally presents with decreased B UE strength and decreased B shoulder AROM impacting independence with ADL. Pt demonstrates significant decline from PLOF and would benefit from short-term SNF placement for continued rehabilitation. Pt is agreeable to this plan. OT will continue to follow acutely in order to improve independence with ADL and functional mobility.     Follow Up Recommendations  SNF;Supervision/Assistance - 24 hour    Equipment Recommendations  Other (comment) (TBD)    Recommendations for Other Services       Precautions / Restrictions Precautions Precautions: Fall;Cervical Required Braces or Orthoses: Cervical Brace Cervical Brace: Hard collar Restrictions Weight Bearing Restrictions: Yes LLE Weight Bearing: Weight bearing as tolerated      Mobility Bed Mobility Overal bed mobility: Needs Assistance Bed Mobility: Rolling;Sidelying to Sit Rolling: Mod assist;+2 for physical assistance;+2 for safety/equipment Sidelying to sit: Mod assist;+2 for physical assistance       General bed mobility comments: increased time, use of bed rails, verbal and tactile cueing for log roll technique, mod A at pelvis to roll and mod A for trunk elevation to achieve upright sitting at  EOB  Transfers Overall transfer level: Needs assistance Equipment used: Rolling walker (2 wheeled);2 person hand held assist Transfers: Sit to/from [facility] Sit to Stand: Mod assist;+2 physical assistance;+2 safety/equipment Stand pivot transfers: Max assist;+2 physical assistance       General transfer comment: increased time, VC'ing for bilateral hand placement, mod A x2 to stand from bed with RW and with 2 person HHA; max A x2 for pivotal movement from bed to recliner chair    Balance Overall balance assessment: Needs assistance;History of Falls Sitting-balance support: Feet supported;Bilateral upper extremity supported Sitting balance-Leahy Scale: Poor Sitting balance - Comments: pt with constant L lateral lean that required min A to correct; constant close min guard for safety. pt tolerating sitting EOB x10 minutes. Postural control: Left lateral lean Standing balance support: During functional activity;Bilateral upper extremity supported Standing balance-Leahy Scale: Poor Standing balance comment: pt reliant on bilateral UEs on external supports and mod-max A x2                           ADL either performed or assessed with clinical judgement   ADL Overall ADL's : Needs assistance/impaired Eating/Feeding: Set up;Sitting   Grooming: Minimal assistance;Sitting   Upper Body Bathing: Minimal assistance;Sitting   Lower Body Bathing: Maximal assistance;Sit to/from stand   Upper Body Dressing : Minimal assistance;Sitting   Lower Body Dressing: Maximal assistance;Sit to/from stand   Toilet Transfer: Maximal assistance;Stand-pivot;Ambulation;BSC;+2 for safety/equipment;+2 for physical assistance   Toileting- Clothing Manipulation and Hygiene: Total assistance;Sit to/from stand         General ADL Comments: Pt with significant L lateral lean and L cervical flexion.  Vision Baseline Vision/History: Wears glasses Wears Glasses: At all  times Patient Visual Report: No change from baseline Vision Assessment?: No apparent visual deficits     Perception     Praxis      Pertinent Vitals/Pain Pain Assessment: 0-10 Pain Score: 10-Worst pain ever Pain Location: L hip Pain Descriptors / Indicators: Sore;Grimacing;Guarding Pain Intervention(s): Limited activity within patient's tolerance;Monitored during session;Repositioned     Hand Dominance Right   Extremity/Trunk Assessment Upper Extremity Assessment Upper Extremity Assessment: RUE deficits/detail;LUE deficits/detail RUE Deficits / Details: Pain with movement and limited to 120 degrees of shoulder flexion. LUE Deficits / Details: Pain with movement and limited to approximately 100 degrees of shoulder movement. Significant bruising on L scapula.   Lower Extremity Assessment Lower Extremity Assessment: Defer to PT evaluation LLE Deficits / Details: Pt with decreased strength and ROM limitations secondary to post-op. Sensation grossly intact. LLE: Unable to fully assess due to pain   Cervical / Trunk Assessment Cervical / Trunk Assessment: Other exceptions Cervical / Trunk Exceptions: pt with chronic C3 fx, hard cervical collar in place   Communication Communication Communication: No difficulties   Cognition Arousal/Alertness: Awake/alert Behavior During Therapy: WFL for tasks assessed/performed Overall Cognitive Status: Within Functional Limits for tasks assessed                                     General Comments       Exercises     Shoulder Instructions      Home Living Family/patient expects to be discharged to:: Skilled nursing facility                                 Additional Comments: Pt lives alone in an apartment but is agreeable to ST SNF upon d/c      Prior Functioning/Environment Level of Independence: Independent with assistive device(s)        Comments: pt reported that she ambulates with use of a SPC  or rollator        OT Problem List: Decreased strength;Decreased range of motion;Decreased activity tolerance;Impaired balance (sitting and/or standing);Decreased safety awareness;Decreased knowledge of use of DME or AE;Decreased knowledge of precautions;Pain      OT Treatment/Interventions: Self-care/ADL training;Therapeutic exercise;Therapeutic activities;Energy conservation;DME and/or AE instruction;Patient/family education;Balance training    OT Goals(Current goals can be found in the care plan section) Acute Rehab OT Goals Patient Stated Goal: go to rehab to get stronger OT Goal Formulation: With patient Time For Goal Achievement: 06/25/16 Potential to Achieve Goals: Good ADL Goals Pt Will Perform Lower Body Bathing: with supervision;sit to/from stand;with adaptive equipment Pt Will Perform Lower Body Dressing: with supervision;sit to/from stand;with adaptive equipment Pt Will Transfer to Toilet: with supervision;ambulating;bedside commode (BSC over toilet) Pt Will Perform Toileting - Clothing Manipulation and hygiene: with supervision;sit to/from stand Pt Will Perform Tub/Shower Transfer: with min guard assist;rolling walker;3 in 1;Shower transfer;Tub transfer  OT Frequency: Min 2X/week   Barriers to D/C:            Co-evaluation PT/OT/SLP Co-Evaluation/Treatment: Yes Reason for Co-Treatment: For patient/therapist safety PT goals addressed during session: Mobility/safety with mobility;Balance;Proper use of DME OT goals addressed during session: ADL's and self-care      End of Session Equipment Utilized During Treatment: Gait belt;Rolling walker Nurse Communication: Mobility status  Activity Tolerance: Patient tolerated treatment well Patient left: in chair;with  call bell/phone within reach;with chair alarm set  OT Visit Diagnosis: Unsteadiness on feet (R26.81);Pain Pain - Right/Left: Left Pain - part of body: Leg;Arm;Shoulder                Time: [id number] OT Time  Calculation (min): 33 min Charges:  OT General Charges $OT Visit: 1 Procedure OT Evaluation $OT Eval Moderate Complexity: 1 Procedure G-Codes:     Norman Herrlich, MS OTR/L  Pager: [place] 06/11/2016, 4:28 PM

## 2016-06-12 ENCOUNTER — Inpatient Hospital Stay (HOSPITAL_COMMUNITY): Payer: Medicare Other

## 2016-06-12 DIAGNOSIS — M5442 Lumbago with sciatica, left side: Secondary | ICD-10-CM

## 2016-06-12 DIAGNOSIS — Z87310 Personal history of (healed) osteoporosis fracture: Secondary | ICD-10-CM

## 2016-06-12 DIAGNOSIS — X58XXXD Exposure to other specified factors, subsequent encounter: Secondary | ICD-10-CM

## 2016-06-12 DIAGNOSIS — E041 Nontoxic single thyroid nodule: Secondary | ICD-10-CM

## 2016-06-12 DIAGNOSIS — K5900 Constipation, unspecified: Secondary | ICD-10-CM

## 2016-06-12 DIAGNOSIS — G8929 Other chronic pain: Secondary | ICD-10-CM

## 2016-06-12 DIAGNOSIS — S42031K Displaced fracture of lateral end of right clavicle, subsequent encounter for fracture with nonunion: Secondary | ICD-10-CM

## 2016-06-12 DIAGNOSIS — E46 Unspecified protein-calorie malnutrition: Secondary | ICD-10-CM

## 2016-06-12 LAB — CBC
HCT: 25.5 % — ABNORMAL LOW (ref 36.0–46.0)
HEMOGLOBIN: 7.7 g/dL — AB (ref 12.0–15.0)
MCH: 26.3 pg (ref 26.0–34.0)
MCHC: 30.2 g/dL (ref 30.0–36.0)
MCV: 87 fL (ref 78.0–100.0)
Platelets: 291 10*3/uL (ref 150–400)
RBC: 2.93 MIL/uL — ABNORMAL LOW (ref 3.87–5.11)
RDW: 17 % — AB (ref 11.5–15.5)
WBC: 9.8 10*3/uL (ref 4.0–10.5)

## 2016-06-12 LAB — BASIC METABOLIC PANEL
ANION GAP: 6 (ref 5–15)
BUN: 5 mg/dL — ABNORMAL LOW (ref 6–20)
CALCIUM: 8 mg/dL — AB (ref 8.9–10.3)
CO2: 35 mmol/L — ABNORMAL HIGH (ref 22–32)
CREATININE: 0.31 mg/dL — AB (ref 0.44–1.00)
Chloride: 102 mmol/L (ref 101–111)
GFR calc non Af Amer: >60 mL/min (ref >60)
Glucose, Bld: 103 mg/dL — ABNORMAL HIGH (ref 65–99)
Potassium: 3.7 mmol/L (ref 3.5–5.1)
SODIUM: 143 mmol/L (ref 135–145)

## 2016-06-12 LAB — CK: Total CK: 682 U/L — ABNORMAL HIGH (ref 38–234)

## 2016-06-12 MED ORDER — POLYETHYLENE GLYCOL 3350 17 G PO PACK
17.0000 g | PACK | Freq: Every day | ORAL | Status: DC
  Administered 2016-06-12 – 2016-06-13 (×2): 17 g via ORAL
  Filled 2016-06-12 (×2): qty 1

## 2016-06-12 NOTE — Progress Notes (Signed)
Internal Medicine Attending:   I saw and examined the patient. I reviewed the resident's note and I agree with the resident's findings and plan as documented in the resident's note.  Patient feels better today but still complaining of pain in her left hip as well as her right shoulder. She is now status post repair of her displaced intertrochanteric fracture left femur. She worked with physical therapy yesterday who recommended 24-hour supervision and placement in SNF. Orthopedics follow-up and recommendations appreciated. 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 Continue pain control for now. Her CK continues to improve. No further IV fluids are part of this time. Patient likely to be placed in SNF in the next 1-2 days.

## 2016-06-12 NOTE — Progress Notes (Signed)
Patient doing well this morning.  Having some left leg pain. Neck range of motion is full out of the collar with no tenderness and no paresthesias or radicular symptoms.  The collar is therefore removed. Plan at this time is to continue with mobilization.  Anticipate skilled nursing facility placement this week

## 2016-06-12 NOTE — Progress Notes (Addendum)
Subjective: [name] is looking much better today. She continues to reports a great deal of pain in her left hip but the pain medications are helping. She has a good appetite and walked with physical therapy. She is concerned about constipation and has not had a BM. She also reports her right arm and shoulder are painful. She inquired about the possibility of removing her cervical collar today.   Objective: Vital signs in last 24 hours: Vitals:   06/11/16 1530 06/11/16 2054 06/12/16 0700 06/12/16 0821  BP:  (!) 116/49 137/67   Pulse:  (!) 110 96   Resp:      Temp:  99 F (37.2 C) 97.9 F (36.6 C)   TempSrc:  Oral Oral   SpO2: 94% 93% 92% (!) 89%    Intake/Output Summary (Last 24 hours) at 06/12/16 0938 Last data filed at 06/11/16 1700  Gross per 24 hour  Intake              480 ml  Output                0 ml  Net              480 ml   Physical Exam General appearance: Cachectic elderly woman with cervical collar laying in bed, in no distress, pleasant and conversational HENT: Normocephalic, atraumatic, moist MM, poor dentition Cardiovascular: RRR, no murmurs, rubs, gallops Respiratory/Chest: Clear to ausculation anterioraly, normal work of breathing, mild cough Abdomen: Soft, mild diffuse TTP, non-distended Skin: Warm, dry, scattered bruising, decreased skin turgor Extremities:Thin bulk, no edema, muscles diffusely tender to palpation, right shoulder, elbow, and arm tender to palpation Psych: Positive affect  Labs / Imaging / Procedures: CBC Latest Ref Rng & Units 06/12/2016 06/11/2016 06/10/2016  WBC 4.0 - 10.5 K/uL 9.8 13.5(H) 16.4(H)  Hemoglobin 12.0 - 15.0 g/dL 7.7(L) 8.2(L) 10.7(L)  Hematocrit 36.0 - 46.0 % 25.5(L) 27.6(L) 34.9(L)  Platelets 150 - 400 K/uL 291 322 387   BMP Latest Ref Rng & Units 06/12/2016 06/11/2016 06/11/2016  Glucose 65 - 99 mg/dL 103(H) 194(H) 88  BUN 6 - 20 mg/dL 5(L) 5(L) 7  Creatinine 0.44 - 1.00 mg/dL 0.31(L) 0.47 0.34(L)  BUN/Creat Ratio 11 -  26 - - -  Sodium 135 - 145 mmol/L 143 143 148(H)  Potassium 3.5 - 5.1 mmol/L 3.7 3.2(L) 3.5  Chloride 101 - 111 mmol/L 102 105 106  CO2 22 - 32 mmol/L 35(H) 35(H) 34(H)  Calcium 8.9 - 10.3 mg/dL 8.0(L) 7.5(L) 7.6(L)   Assessment/Plan: [name] is a 66 y.o. woman with PMH chronic pain, depression, bipolar disorder, osteoporosis with bisphosphonate noncompliance admitted for fall with prolonged down time resulting in left hip fracture and rhabdomyolysis.    Principal Problem:   Closed displaced intertrochanteric fracture of left femur (HCC) Active Problems:   Chronic back pain   Osteoporosis   Bipolar I disorder, most recent episode depressed (HCC)   COPD (chronic obstructive pulmonary disease) with emphysema (HCC)   Protein-calorie malnutrition, severe (HCC)   Constipation   C3 cervical fracture (HCC)  Closed displaced intertrochanteric fracture of left femur, fell at home and found down for approx 2 days. Has history of frequent falls and fragility fractures. Previously resided at [facility]. Takes scheduled opioids, from [facility] per patient but no opioid Rx visible on [place] database since 2016, denies alcohol use. Underwent intramedullary implant / ORIF on 3/30 via Dr. Erlinda Hong. -- Appreciate ortho recs -- Norco 5-325 mg  1-2 tab Q6h PRN -- Oxycodone 5-10 mg Q4h PRN -- Dc Morphine 0.5 mg Q2h PRN -- PT/OT -- Planned for SNF  Right arm and shoulder pain, states that she fractured this relatively recently, has tenderness to palpation from elbow to her scapula, unclear if this is simply the underlying muscle. Pain with active and passive ROM. Never imaged. -- Obtain films of right shoulder and humerus today  Rhabdomyolysis, improving,CK 960 from 3700 on admission, due to prolonged time down after fall and hip fracture at home. No renal dysfunction with normal BUN and creatinine. Diffuse myalgia and tenderness to palpation continues.  -- Recheck CK today, may provide further IV  fluids if remains signif elevated -- Encourage po intake  Constipation, reports no BM in days and on multiple opioid medications -- Increase Senna-S to 2 tablets BID -- Miralax QD sch  Malnutrition, cachectic and weights only 92 lbs, albumin 3.2, suspect very poor nutritional status. Vit B12 normal, folate pending. -- Nutrition consulted, appreciate recs -- Ensure BID between meals  Bipolar disorder -- continue home Venlafaxine, Lamictal, and Zyprexa   COPD, oxygen dependent 3L at home, no PFTs in system  -- Duoneb Q4h scheduled -- Albuterol Q4h PRN -- Continue supplemental O2 2-3L, maintain SpO2 > 92%  Chronic back pain, reports using hydrocodone every 6 hours scheduled at home, receives this via [facility] but this is not visible in [place] database. Reports left leg sciatica -- home Gabapentin 300 mg QHS  -- pain regimen per above  Right thyroid nodule, 14 mm, normal TSH -- Outpatient surveillance  DVT Lovenox Diet HH DNR  Dispo: Anticipated discharge in approximately 3-4 day(s).   LOS: 3 days   Asencion Partridge, MD 06/12/2016, 9:23 AM Pager: [phone]

## 2016-06-13 ENCOUNTER — Telehealth: Payer: Self-pay

## 2016-06-13 ENCOUNTER — Encounter (HOSPITAL_COMMUNITY): Payer: Self-pay | Admitting: Orthopaedic Surgery

## 2016-06-13 DIAGNOSIS — W19XXXD Unspecified fall, subsequent encounter: Secondary | ICD-10-CM

## 2016-06-13 DIAGNOSIS — E041 Nontoxic single thyroid nodule: Secondary | ICD-10-CM

## 2016-06-13 DIAGNOSIS — S72142D Displaced intertrochanteric fracture of left femur, subsequent encounter for closed fracture with routine healing: Secondary | ICD-10-CM

## 2016-06-13 LAB — BASIC METABOLIC PANEL
ANION GAP: 7 (ref 5–15)
CO2: 38 mmol/L — ABNORMAL HIGH (ref 22–32)
Calcium: 7.8 mg/dL — ABNORMAL LOW (ref 8.9–10.3)
Chloride: 101 mmol/L (ref 101–111)
Creatinine, Ser: 0.32 mg/dL — ABNORMAL LOW (ref 0.44–1.00)
GFR calc Af Amer: >60 mL/min (ref >60)
Glucose, Bld: 94 mg/dL (ref 65–99)
POTASSIUM: 3.8 mmol/L (ref 3.5–5.1)
SODIUM: 146 mmol/L — AB (ref 135–145)

## 2016-06-13 LAB — CBC
HCT: 25.5 % — ABNORMAL LOW (ref 36.0–46.0)
Hemoglobin: 7.5 g/dL — ABNORMAL LOW (ref 12.0–15.0)
MCH: 25.6 pg — ABNORMAL LOW (ref 26.0–34.0)
MCHC: 29.4 g/dL — ABNORMAL LOW (ref 30.0–36.0)
MCV: 87 fL (ref 78.0–100.0)
PLATELETS: 314 10*3/uL (ref 150–400)
RBC: 2.93 MIL/uL — ABNORMAL LOW (ref 3.87–5.11)
RDW: 16.8 % — ABNORMAL HIGH (ref 11.5–15.5)
WBC: 8.5 10*3/uL (ref 4.0–10.5)

## 2016-06-13 LAB — FOLATE RBC
FOLATE, RBC: 990 ng/mL (ref >498)
Folate, Hemolysate: 364.4 ng/mL
Hematocrit: 36.8 % (ref 34.0–46.6)

## 2016-06-13 LAB — LAMOTRIGINE LEVEL: LAMOTRIGINE LVL: NOT DETECTED ug/mL (ref 2.0–20.0)

## 2016-06-13 MED ORDER — HYDROCODONE-ACETAMINOPHEN 5-325 MG PO TABS: 1.0000 | ORAL_TABLET | ORAL | 0 refills | Status: DC | PRN

## 2016-06-13 MED ORDER — ENSURE ENLIVE PO LIQD: 237.0000 mL | Freq: Two times a day (BID) | ORAL | 12 refills | Status: DC

## 2016-06-13 MED ORDER — ALENDRONATE SODIUM 10 MG PO TABS: 10.0000 mg | ORAL_TABLET | Freq: Every day | ORAL | 1 refills | Status: DC

## 2016-06-13 MED ORDER — ENOXAPARIN SODIUM 40 MG/0.4ML ~~LOC~~ SOLN: 40.0000 mg | SUBCUTANEOUS | 0 refills | Status: DC

## 2016-06-13 MED ORDER — OXYCODONE HCL 5 MG PO TABS: 5.0000 mg | ORAL_TABLET | Freq: Four times a day (QID) | ORAL | Status: DC | PRN

## 2016-06-13 MED ORDER — OXYCODONE HCL 5 MG PO TABS
5.0000 mg | ORAL_TABLET | Freq: Four times a day (QID) | ORAL | Status: DC | PRN
  Administered 2016-06-13 – 2016-06-14 (×3): 5 mg via ORAL
  Filled 2016-06-13 (×3): qty 1

## 2016-06-13 MED ORDER — HYDROCODONE-ACETAMINOPHEN 5-325 MG PO TABS: 1.0000 | ORAL_TABLET | ORAL | 0 refills | Status: AC | PRN

## 2016-06-13 NOTE — Care Management Important Message (Signed)
Important Message  Patient Details  Name: [name] MRN: [id number] Date of Birth: 08-19-1950   Medicare Important Message Given:       Orbie Pyo 06/13/2016, 4:47 PM

## 2016-06-13 NOTE — NC FL2 (Signed)
[place] LEVEL OF CARE SCREENING TOOL     IDENTIFICATION  Patient Name: [name] Birthdate: 1950/06/07 Sex: female Admission Date (Current Location): 06/09/2016  [facility] and [place] Number:  Herbalist and Address:  [facility]. [facility], [place] [address], [place], [place] [address]      Provider Number: [id number]  Attending Physician Name and Address:  Axel Filler, MD  Relative Name and Phone Number:       Current Level of Care: Hospital Recommended Level of Care: [place] Prior Approval Number:    Date Approved/Denied:   PASRR Number:    Discharge Plan: SNF    Current Diagnoses: Patient Active Problem List   Diagnosis Date Noted  . Right thyroid nodule 06/13/2016  . C3 cervical fracture ([place]) 06/11/2016  . Closed displaced intertrochanteric fracture of left femur ([place]) 06/09/2016  . Wrist drop, left 03/10/2016  . Acute exacerbation of chronic obstructive pulmonary disease (COPD) ([place]) 05/06/2015  . Constipation   . Orthostatic hypotension 11/11/2014  . Duodenal ulcer   . Polypharmacy 10/24/2014  . Smoking 10/24/2014  . Trochanteric bursitis of left hip 10/01/2014  . Rash and nonspecific skin eruption 09/17/2014  . Left hip pain 09/17/2014  . Immunization, tetanus-diphtheria 09/05/2014  . Iron deficiency anemia 11/12/2013  . Health care maintenance 05/14/2013  . Nausea and vomiting 04/17/2013  . Insomnia 03/06/2013  . GERD (gastroesophageal reflux disease) 03/06/2013  . Protein-calorie malnutrition, severe ([place]) 03/04/2013  . Chronic pain 03/02/2013  . Dyslipidemia 02/26/2013  . Seasonal allergies 07/09/2012  . COPD (chronic obstructive pulmonary disease) with emphysema ([place]) 03/01/2011  . Osteoporosis 06/17/2009  . Bipolar I disorder, most recent episode depressed ([place]) 06/02/2009  . Vitamin D deficiency 01/03/2007  . Chronic back pain 12/07/2005    Orientation RESPIRATION BLADDER  Height & Weight     Self, Situation, Place, Time  O2 (Nasal Cannula, 3L) Continent Weight: 98 lb 11.2 oz (44.8 kg) Height:     BEHAVIORAL SYMPTOMS/MOOD NEUROLOGICAL BOWEL NUTRITION STATUS      Continent  (Please see d/c summary )  AMBULATORY STATUS COMMUNICATION OF NEEDS Skin   Extensive Assist Verbally                         Personal Care Assistance Level of Assistance  Bathing, Feeding, Dressing Bathing Assistance: Maximum assistance Feeding assistance: Limited assistance Dressing Assistance: Maximum assistance     Functional Limitations Info  Sight, Hearing, Speech Sight Info: Adequate Hearing Info: Adequate Speech Info: Adequate    SPECIAL CARE FACTORS FREQUENCY  PT (By licensed PT), OT (By licensed OT)     PT Frequency: 2x OT Frequency: 2x            Contractures Contractures Info: Not present    Additional Factors Info  Code Status, Allergies Code Status Info: DNR Allergies Info: Banana, Lyrica Pregabalin, Pollen Extract           Current Medications (06/13/2016):  This is the current hospital active medication list Current Facility-Administered Medications  Medication Dose Route Frequency Provider Last Rate Last Dose  . acetaminophen (TYLENOL) tablet 650 mg  650 mg Oral Q6H PRN Burgess Estelle, MD   650 mg at 06/11/16 2320   Or  . acetaminophen (TYLENOL) suppository 650 mg  650 mg Rectal Q6H PRN Burgess Estelle, MD      . acetaminophen (TYLENOL) tablet 650 mg  650 mg Oral Q6H PRN Naiping M  Erlinda Hong, MD       Or  . acetaminophen (TYLENOL) suppository 650 mg  650 mg Rectal Q6H PRN Naiping Ephriam Jenkins, MD      . albuterol (PROVENTIL) (2.5 MG/3ML) 0.083% nebulizer solution 3 mL  3 mL Inhalation Q4H PRN Burgess Estelle, MD   3 mL at 06/10/16 1428  . alum & mag hydroxide-simeth (MAALOX/MYLANTA) 200-200-20 MG/5ML suspension 30 mL  30 mL Oral Q4H PRN Naiping Ephriam Jenkins, MD      . enoxaparin (LOVENOX) injection 40 mg  40 mg Subcutaneous Q24H Naiping Ephriam Jenkins, MD   40 mg at 06/12/16 1627   . feeding supplement (ENSURE ENLIVE) (ENSURE ENLIVE) liquid 237 mL  237 mL Oral BID BM Nischal Narendra, MD   237 mL at 06/13/16 1000  . fluticasone (FLONASE) 50 MCG/ACT nasal spray 2 spray  2 spray Each Nare Daily Burgess Estelle, MD   2 spray at 06/13/16 1000  . gabapentin (NEURONTIN) capsule 300 mg  300 mg Oral QHS Asencion Partridge, MD   300 mg at 06/12/16 2148  . HYDROcodone-acetaminophen (NORCO/VICODIN) 5-325 MG per tablet 1-2 tablet  1-2 tablet Oral Q6H PRN Leandrew Koyanagi, MD   2 tablet at 06/13/16 0415  . ipratropium-albuterol (DUONEB) 0.5-2.5 (3) MG/3ML nebulizer solution 3 mL  3 mL Nebulization QID Asencion Partridge, MD   3 mL at 06/13/16 1236  . lactated ringers infusion   Intravenous Continuous Rica Koyanagi, MD 10 mL/hr at 06/10/16 1040    . lamoTRIgine (LAMICTAL) tablet 100 mg  100 mg Oral Daily Asencion Partridge, MD   100 mg at 06/13/16 3220  . loratadine (CLARITIN) tablet 10 mg  10 mg Oral QHS Burgess Estelle, MD   10 mg at 06/12/16 2148  . menthol-cetylpyridinium (CEPACOL) lozenge 3 mg  1 lozenge Oral PRN Naiping Ephriam Jenkins, MD       Or  . phenol (CHLORASEPTIC) mouth spray 1 spray  1 spray Mouth/Throat PRN Naiping Ephriam Jenkins, MD      . methocarbamol (ROBAXIN) tablet 500 mg  500 mg Oral Q6H PRN Leandrew Koyanagi, MD   500 mg at 06/11/16 1214   Or  . methocarbamol (ROBAXIN) 500 mg in dextrose 5 % 50 mL IVPB  500 mg Intravenous Q6H PRN Naiping Ephriam Jenkins, MD      . metoCLOPramide (REGLAN) tablet 5-10 mg  5-10 mg Oral Q8H PRN Naiping Ephriam Jenkins, MD       Or  . metoCLOPramide (REGLAN) injection 5-10 mg  5-10 mg Intravenous Q8H PRN Leandrew Koyanagi, MD      . OLANZapine [facility]) tablet 2.5 mg  2.5 mg Oral QHS Asencion Partridge, MD   2.5 mg at 06/12/16 2148  . ondansetron (ZOFRAN) tablet 4 mg  4 mg Oral Q6H PRN Naiping Ephriam Jenkins, MD       Or  . ondansetron [facility]) injection 4 mg  4 mg Intravenous Q6H PRN Naiping Ephriam Jenkins, MD      . oxyCODONE (Oxy IR/ROXICODONE) immediate release tablet 5 mg  5 mg Oral Q6H PRN Asencion Partridge, MD   5 mg at 06/13/16  0943  . pantoprazole (PROTONIX) EC tablet 40 mg  40 mg Oral BID Burgess Estelle, MD   40 mg at 06/13/16 0937  . polyethylene glycol (MIRALAX / GLYCOLAX) packet 17 g  17 g Oral Daily Asencion Partridge, MD   17 g at 06/13/16 2542  . senna-docusate (Senokot-S) tablet 2 tablet  2 tablet Oral BID Asencion Partridge, MD  2 tablet at 06/13/16 0937  . sodium chloride flush (NS) 0.9 % injection 3 mL  3 mL Intravenous Q12H Burgess Estelle, MD   3 mL at 06/13/16 1000  . venlafaxine XR (EFFEXOR-XR) 24 hr capsule 37.5 mg  37.5 mg Oral QPM Burgess Estelle, MD   37.5 mg at 06/12/16 1627     Discharge Medications: Please see discharge summary for a list of discharge medications.  Relevant Imaging Results:  Relevant Lab Results:   Additional Information ssn: [id number]  Eileen Stanford, LCSW

## 2016-06-13 NOTE — Progress Notes (Signed)
Physical Therapy Treatment Patient Details Name: [name] MRN: [id number] DOB: [birth year] Today's Date: 06/13/2016    History of Present Illness Pt is a 66 y/o female admitted after sustaining a fall at home. Pt sustained a L intertrochanteric femur fx and is now s/p IM nail. 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 PMH including but not limited to COPD (dependent on supplemental O2, on 3L at all times), bipolar disorder, depression and substance abuse.    PT Comments    Pt presented awake and alert supine in bed with 10/10 pain but was eager to participate. Pt required mod assist x2 and cueing for all transfers. Pt showed improvement from previous treatment given increased time in standing and attempted ambulation and was motivated to get out of bed. Pt attempted ambulation but appeared apprehensive and sat abruptly to EOB. Pt is progressing towards goal of d/c to SNF and will benefit from continued therapy to increase strength, mobility and quality of life.    Follow Up Recommendations  SNF;Supervision/Assistance - 24 hour     Equipment Recommendations  Rolling walker with 5" wheels;None recommended by PT    Recommendations for Other Services       Precautions / Restrictions Precautions Precautions: Fall;Cervical Required Braces or Orthoses:  (Dr. Marlou Sa cleared [facility] and dc'd collar on 4/1) Restrictions Weight Bearing Restrictions: Yes LLE Weight Bearing: Weight bearing as tolerated    Mobility  Bed Mobility Overal bed mobility: Needs Assistance Bed Mobility: Rolling;Sidelying to Sit Rolling: Mod assist Sidelying to sit: Mod assist;+2 for physical assistance;HOB elevated       General bed mobility comments: increased time, use of bed rails, verbal cueing for pelvic motion using a half-bridge to EOB, mod A for trunk elevation to achieve upright sitting at EOB  Transfers Overall transfer level: Needs assistance Equipment used: Rolling walker (2  wheeled) Transfers: Sit to/from Stand Sit to Stand: Mod assist;+2 safety/equipment         General transfer comment: increased time, VC'ing for bilateral hand placement, mod A x2 to stand from bed with RW. Performed sit to stand twice. Unable to take pivot steps due to pain, moved bed and brought chair behind.   Ambulation/Gait             General Gait Details:  (One step foreward, pt unsteady and in pain)   Stairs            Wheelchair Mobility    Modified Rankin (Stroke Patients Only)       Balance Overall balance assessment: Needs assistance;History of Falls Sitting-balance support: Single extremity supported;Feet supported Sitting balance-Leahy Scale: Fair Sitting balance - Comments: pt with L lateral lean 50% of time EOB; constant close min guard for safety. pt tolerating sitting EOB Postural control: Left lateral lean Standing balance support: During functional activity;Bilateral upper extremity supported Standing balance-Leahy Scale: Poor Standing balance comment: pt reliant on bilateral UEs on external supports and mod-max A x2                            Cognition Arousal/Alertness: Awake/alert Behavior During Therapy: WFL for tasks assessed/performed Overall Cognitive Status: Within Functional Limits for tasks assessed                                        Exercises Total Joint Exercises Quad Sets: Strengthening;Left;10  reps;Seated Gluteal Sets: Strengthening;Both;10 reps;Seated Isometric Hip Adduction: Strengthening;Both;10 reps;Seated    General Comments        Pertinent Vitals/Pain Pain Score: 10-Worst pain ever Pain Location: L hip, R shoulder Pain Descriptors / Indicators: Grimacing;Guarding;Sore Pain Intervention(s): Limited activity within patient's tolerance;Monitored during session;Patient requesting pain meds-RN notified;Utilized relaxation techniques    Home Living                      Prior  Function            PT Goals (current goals can now be found in the care plan section) Acute Rehab PT Goals Patient Stated Goal: go to rehab to get stronger PT Goal Formulation: With patient Time For Goal Achievement: 06/25/16 Potential to Achieve Goals: Good Progress towards PT goals: Progressing toward goals    Frequency    Min 2X/week      PT Plan      Co-evaluation PT/OT/SLP Co-Evaluation/Treatment: Yes   PT goals addressed during session: Mobility/safety with mobility;Strengthening/ROM       End of Session Equipment Utilized During Treatment: Gait belt;Oxygen Activity Tolerance: Patient limited by fatigue;Patient limited by pain Patient left: in chair;with call bell/phone within reach;with chair alarm set Nurse Communication: Patient requests pain meds PT Visit Diagnosis: Unsteadiness on feet (R26.81);Other abnormalities of gait and mobility (R26.89);Pain Pain - Right/Left: Left Pain - part of body: Hip     Time: 13:55  14:30   35 minutes  Charges:    2 Therapeutic Activities                 G Codes:       Sandria Bales, SPT Acute Rehabilitation Services Office: [phone]   Sandria Bales 06/13/2016, 4:14 PM

## 2016-06-13 NOTE — Progress Notes (Addendum)
Subjective: [name] states the pain has improved today, however she is very sleepy and received oxycodone recently. She reports that she still has had no BM in several days, but denies abdominal pain or nausea.   Objective: Vital signs in last 24 hours: Vitals:   06/12/16 1155 06/12/16 1500 06/12/16 1543 06/13/16 0500  BP:  (!) 118/55  (!) 134/59  Pulse:  (!) 101  91  Resp:  16    Temp:  98 F (36.7 C)  98.6 F (37 C)  TempSrc:  Oral  Oral  SpO2: 95% 95% 95%     Intake/Output Summary (Last 24 hours) at 06/13/16 0710 Last data filed at 06/12/16 1100  Gross per 24 hour  Intake              240 ml  Output                0 ml  Net              240 ml   Physical Exam General appearance: Cachectic elderly woman laying in bed, in no distress, falling asleep during conversation HENT: Normocephalic, moist MM, poor dentition Cardiovascular: RRR, no murmurs, rubs, gallops Respiratory/Chest: Clear to ausculation anterioraly, normal work of breathing, mild cough Abdomen: Soft, non-tender, non-distended Skin: Warm, dry, intact Extremities:Thin bulk, no edema, mildly sore muscles, arm mobility improving  Labs / Imaging / Procedures: CBC Latest Ref Rng & Units 06/13/2016 06/12/2016 06/11/2016  WBC 4.0 - 10.5 K/uL 8.5 9.8 13.5(H)  Hemoglobin 12.0 - 15.0 g/dL 7.5(L) 7.7(L) 8.2(L)  Hematocrit 36.0 - 46.0 % 25.5(L) 25.5(L) 27.6(L)  Platelets 150 - 400 K/uL 314 291 322   BMP Latest Ref Rng & Units 06/13/2016 06/12/2016 06/11/2016  Glucose 65 - 99 mg/dL 94 103(H) 194(H)  BUN 6 - 20 mg/dL <5(L) 5(L) 5(L)  Creatinine 0.44 - 1.00 mg/dL 0.32(L) 0.31(L) 0.47  BUN/Creat Ratio 11 - 26 - - -  Sodium 135 - 145 mmol/L 146(H) 143 143  Potassium 3.5 - 5.1 mmol/L 3.8 3.7 3.2(L)  Chloride 101 - 111 mmol/L 101 102 105  CO2 22 - 32 mmol/L 38(H) 35(H) 35(H)  Calcium 8.9 - 10.3 mg/dL 7.8(L) 8.0(L) 7.5(L)   Assessment/Plan: [name] is a 66 y.o. woman with PMH chronic pain, depression, bipolar  disorder, osteoporosis with bisphosphonate noncompliance admitted for fall with prolonged down time resulting in left hip fracture and rhabdomyolysis.    Principal Problem:   Closed displaced intertrochanteric fracture of left femur (HCC) Active Problems:   Chronic back pain   Osteoporosis   Bipolar I disorder, most recent episode depressed (HCC)   COPD (chronic obstructive pulmonary disease) with emphysema (HCC)   Protein-calorie malnutrition, severe (HCC)   Constipation   C3 cervical fracture (HCC)  Closed displaced intertrochanteric fracture of left femur, fell at home and found down for approx 2 days. Has history of frequent falls and fragility fractures. Previously resided at [facility]. Takes scheduled opioids, from [place] clinic per patient but no opioid Rx visible on [place] database since 2016, denies alcohol use. Underwent intramedullary implant / ORIF on 3/30 via Dr. Erlinda Hong. Now POD  -- Appreciate ortho recs -- Norco 5-325 mg 1-2 tab Q6h PRN -- Dc Oxycodone 5 mg Q6h PRN -- PT/OT -- Planned for SNF -- Will Rx to receive post-op 10 days of Lovenox for DVT prophylaxis   Right arm and shoulder pain, states that she fractured her right shoulder relatively recently, XR revealed old  distal right clavicular and acromion fractures, ununited, no acute changes -- continue to monitor  Rhabdomyolysis, improving, CK 600s from 3700 on admission, due to prolonged time down after fall and hip fracture at home. No renal dysfunction with normal BUN and creatinine. Diffuse myalgia and tenderness to palpation improving.  -- Encourage po intake  Constipation, reports no BM in days and on multiple opioid medications -- Increase Senna-S to 2 tablets BID -- Miralax QD sch  Malnutrition, cachectic and weights only 92 lbs, albumin 3.2, suspect very poor nutritional status. Vit B12 normal, folate pending. -- Nutrition consulted, appreciate recs -- Ensure BID between meals  Bipolar disorder -- continue  home Venlafaxine, Lamictal, and Zyprexa   COPD, oxygen dependent 3L at home, no PFTs in system  -- Duoneb Q4h scheduled -- Albuterol Q4h PRN -- Continue supplemental O2 2-3L, maintain SpO2 > 92%  Chronic back pain, reports using hydrocodone every 6 hours scheduled at home, receives this via [facility], for some reason this is not visible in [place] database.  -- Called and confirmed her prescriptions with [facility] today, her home medication list is accurate (Hydrocodone-APAP 5-325, 120 tablets monthly, and Gabapentin 300 mg, 30 tablets monthly) -- home Gabapentin 300 mg QHS  -- pain regimen per above  Osteoporosis, with fragility fracture with fall from standing, noncompliant with weekly Alendronate previously -- Will dc on Alendronate 10 mg daily  Right thyroid nodule, 14 mm, normal TSH -- Outpatient surveillance, obtain follow up US in 3-6 months  DVT Lovenox Diet HH DNR  Dispo: Anticipated discharge in approximately 0-1 day(s).   LOS: 4 days   Asencion Partridge, MD 06/13/2016, 7:10 AM Pager: [phone]

## 2016-06-13 NOTE — Clinical Social Work Note (Signed)
Clinical Social Work Assessment  Patient Details  Name: [name] MRN: [id number] Date of Birth: 01-11-1951  Date of referral:  06/13/16               Reason for consult:  Facility Placement                Permission sought to share information with:    Permission granted to share information::     Name::        Agency::     Relationship::     Contact Information:     Housing/Transportation Living arrangements for the past 2 months:  Apartment Source of Information:  Patient Patient Interpreter Needed:  None Criminal Activity/Legal Involvement Pertinent to Current Situation/Hospitalization:  No - Comment as needed Significant Relationships:  Siblings Lives with:  Self Do you feel safe going back to the place where you live?    Need for family participation in patient care:     Care giving concerns:  No family or friends present at bedside.   Social Worker assessment / plan:  Pt is agreeable to SNF placement at d/c. Pt lives in an apartment alone. Pt wants to go anywhere in [place] she can smoke. CSW will initiate bed search at this time.  Employment status:  Retired Nurse, adult PT Recommendations:  [place] / Referral to community resources:  [place]  Patient/Family's Response to care:  Pt verbalized understanding of CSW role and expressed appreciation for support. Pt denies any concern regarding pt care at this time.   Patient/Family's Understanding of and Emotional Response to Diagnosis, Current Treatment, and Prognosis:  Pt understanding and realistic regarding physical limitations. Pt understands the need for SNF placement at d/c. Pt agreeable to SNF placement at d/c, at this time. Pt's responses emotionally appropriate during conversation with CSW. Pt denies any concern regarding treatment plan at this time. CSW will continue to provide support and facilitate d/c needs.   Emotional  Assessment Appearance:  Appears stated age Attitude/Demeanor/Rapport:   (Patient was appropriate.) Affect (typically observed):  Accepting, Appropriate, Calm Orientation:  Oriented to Self, Oriented to Place, Oriented to  Time, Oriented to Situation Alcohol / Substance use:  Not Applicable Psych involvement (Current and /or in the community):  No (Comment)  Discharge Needs  Concerns to be addressed:  No discharge needs identified Readmission within the last 30 days:  No Current discharge risk:  Dependent with Mobility Barriers to Discharge:  Continued Medical Work up   [employer], LCSW 06/13/2016, 1:03 PM

## 2016-06-13 NOTE — Telephone Encounter (Signed)
Hospital TOC per Dr Wynetta Emery, discharge 06/13/2016, appt 06/27/2016.

## 2016-06-14 ENCOUNTER — Telehealth: Payer: Self-pay

## 2016-06-14 DIAGNOSIS — S12200D Unspecified displaced fracture of third cervical vertebra, subsequent encounter for fracture with routine healing: Secondary | ICD-10-CM

## 2016-06-14 DIAGNOSIS — E43 Unspecified severe protein-calorie malnutrition: Secondary | ICD-10-CM

## 2016-06-14 MED ORDER — HYDROCODONE-ACETAMINOPHEN 5-325 MG PO TABS
1.0000 | ORAL_TABLET | ORAL | Status: DC | PRN
  Administered 2016-06-14 (×2): 2 via ORAL
  Filled 2016-06-14 (×2): qty 2

## 2016-06-14 MED ORDER — MOMETASONE FURO-FORMOTEROL FUM 100-5 MCG/ACT IN AERO
2.0000 | INHALATION_SPRAY | Freq: Two times a day (BID) | RESPIRATORY_TRACT | Status: DC
  Administered 2016-06-14: 2 via RESPIRATORY_TRACT
  Filled 2016-06-14: qty 8.8

## 2016-06-14 MED ORDER — POLYETHYLENE GLYCOL 3350 17 G PO PACK
17.0000 g | PACK | Freq: Two times a day (BID) | ORAL | Status: DC
  Administered 2016-06-14: 17 g via ORAL
  Filled 2016-06-14: qty 1

## 2016-06-14 NOTE — Clinical Social Work Note (Signed)
Clinical Social Worker facilitated patient discharge including contacting patient family and facility to confirm patient discharge plans.  Clinical information faxed to facility and family agreeable with plan.  CSW arranged ambulance transport via [facility] to [facility].  RN to call [phone] for report prior to discharge.  Clinical Social Worker will sign off for now as social work intervention is no longer needed. Please consult [place] again if new need arises.  [address] [place]

## 2016-06-14 NOTE — Clinical Social Work Note (Signed)
Pt has received PASRR number: [id number] Erskin Burnet, [place]

## 2016-06-14 NOTE — Telephone Encounter (Signed)
Looking at the discharge summary, I believe this code is correct.  M33.612A

## 2016-06-14 NOTE — Care Management Important Message (Signed)
Important Message  Patient Details  Name: [name] MRN: [id number] Date of Birth: 06-06-1950   Medicare Important Message Given:  Yes    Orbie Pyo 06/14/2016, 2:29 PM

## 2016-06-14 NOTE — Progress Notes (Signed)
Physical Therapy Treatment Note  Clinical Impression:  Pt presented awake and alert supine in bed and eager to participate in therapy. Pt initiated PT session on 3L of O2. Pt required min assist in bed mobility, and min assist x2 in all other transfers for safety, stability, and pt confidence. Pt was transferred from supine to seated at EOB; pt stated she felt low on O2. SpO2 was measured at 81 while seated at EOB. Oxygen levels were increased to 4L and SpO2 increased slowly to 90-92. Pt continued with therapy, successfully stood and took small pivoting steps to chair, and sat in chair. Pt displayed strength to complete sit to stand with min assist but wanted x2 for safety/confidence. Once seated in chair, pt asked for rescue inhaler. Nurse was notified and respiratory services were called. Oxygen level was increased to 5L and SpO2 level was 86. Pt is progressing towards goal of d/c to SNF and will benefit from continued therapy to increase strength, mobility, and quality of life.    06/14/16 1150  PT Visit Information  Last PT Received On 06/14/16  Assistance Needed +2 (Pt is more confident with +2)  PT goals addressed during session Mobility/safety with mobility  History of Present Illness Pt is a 66 y/o female admitted after sustaining a fall at home. Pt sustained a L intertrochanteric femur fx and is now s/p IM nail. 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 PMH including but not limited to COPD (dependent on supplemental O2, on 3L at all times), bipolar disorder, depression and substance abuse.  Restrictions  Weight Bearing Restrictions No  LLE Weight Bearing WBAT  Pain Assessment  Pain Assessment 0-10  Pain Score 9  Pain Location L hip  Pain Descriptors / Indicators Grimacing  Cognition  Arousal/Alertness Awake/alert  Behavior During Therapy WFL for tasks assessed/performed  Overall Cognitive Status Within Functional Limits for tasks assessed  Bed Mobility  Overal bed  mobility   Bed Mobility Rolling;Supine to Sit  Rolling Min assist  Supine to sit Min assist;+2 for safety/equipment  General bed mobility comments increased time, use of bed rails, verbal and tactile cueing for pelvic motion, minimal assist for trunk elevation to achieve upright sitting at EOB  Transfers  Overall transfer level Needs assistance  Equipment used Rolling walker (2 wheeled)  Transfers Stand Pivot Transfers;Sit to/from Stand  Sit to [employer] safety/equipment  Stand pivot transfers Min assist;+2 safety/equipment  General transfer comment Increased time; VC'ing for bilateral hand placement; minimal assistance x2 for safety purposes with stand-pivot from EOB to chair.   Ambulation/Gait  General Gait Details took small pivoting steps to get to chair, placing some weight through LLE   Balance  Overall balance assessment Needs assistance  Sitting-balance support Single extremity supported;Feet supported  Sitting balance-Leahy Scale Poor  Sitting balance - Comments Pt sitting balance was stable but still guarded for safety   Standing balance support During functional activity;Bilateral upper extremity supported  Standing balance-Leahy Scale Poor  Standing balance comment Pt reliant on bilateral UEs on external supports and minimal assistance x2 for confidence boost  PT - End of Session  Equipment Utilized During Treatment Gait belt;Oxygen  Activity Tolerance Patient tolerated treatment well;Patient limited by pain (Pt limited by decreased oxygen saturation)  Patient left in chair;with call bell/phone within reach;Other (comment) (Eating lunch)  Nurse Communication Other (comment) (Requested respiratory treatment )  PT - Assessment/Plan  PT Plan Current plan remains appropriate  PT Visit Diagnosis Unsteadiness on feet (R26.81);Other  abnormalities of gait and mobility (R26.89);Pain  Pain - Right/Left Left  Pain - part of body Hip  PT Frequency (ACUTE ONLY) Min 2X/week   Follow Up Recommendations SNF;Supervision/Assistance - 24 hour  PT equipment Rolling walker with 5" wheels  PT Goal Progression  Progress towards PT goals Progressing toward goals  PT Time Calculation  PT Start Time (ACUTE ONLY) 1157  PT Stop Time (ACUTE ONLY) 1220  PT Time Calculation (min) (ACUTE ONLY) 23 min  PT General Charges  $$ ACUTE PT VISIT 1 Procedure  PT Treatments  $Therapeutic Activity 23-37 mins    Roney Marion, PT  Acute Rehabilitation Services Pager [phone] Office [phone]

## 2016-06-14 NOTE — Progress Notes (Signed)
Subjective: [name] is in good spirits this morning, reports her pain continues to improve. She worked well with physical therapy yesterday and walked some. She still reports no BM this admission despite our bowel regimen but denies any significant abdominal discomfort or nausea. She did not leave for SNF yesterday for unclear reasons, she remains interested in continuing her recovery and working with PT.  Objective: Vital signs in last 24 hours: Vitals:   06/13/16 1236 06/13/16 1601 06/13/16 1938 06/13/16 2300  BP:  129/60  (!) 147/56  Pulse:  92  98  Resp:  16    Temp:  97.6 F (36.4 C)  98.8 F (37.1 C)  TempSrc:  Axillary  Oral  SpO2: (!) 82% 97% 98% 93%  Weight:        Intake/Output Summary (Last 24 hours) at 06/14/16 0827 Last data filed at 06/13/16 1841  Gross per 24 hour  Intake              240 ml  Output                0 ml  Net              240 ml   Physical Exam General appearance: Cachectic elderly woman resting comfortably in bed, in no distress, alert and conversational HENT: Normocephalic, moist MM, nasal cannula in place Cardiovascular: RRR, no murmurs, rubs, gallops Respiratory/Chest: Clear to auscultation anterioraly, normal work of breathing Abdomen: Normal bowel sounds, soft, mild diffuse tenderness to palpation, non-distended Skin: Warm, dry, intact Extremities: Thin bulk, no edema  Labs / Imaging / Procedures: CBC Latest Ref Rng & Units 06/13/2016 06/12/2016 06/11/2016  WBC 4.0 - 10.5 K/uL 8.5 9.8 13.5(H)  Hemoglobin 12.0 - 15.0 g/dL 7.5(L) 7.7(L) 8.2(L)  Hematocrit 36.0 - 46.0 % 25.5(L) 25.5(L) 27.6(L)  Platelets 150 - 400 K/uL 314 291 322   BMP Latest Ref Rng & Units 06/13/2016 06/12/2016 06/11/2016  Glucose 65 - 99 mg/dL 94 103(H) 194(H)  BUN 6 - 20 mg/dL <5(L) 5(L) 5(L)  Creatinine 0.44 - 1.00 mg/dL 0.32(L) 0.31(L) 0.47  BUN/Creat Ratio 11 - 26 - - -  Sodium 135 - 145 mmol/L 146(H) 143 143  Potassium 3.5 - 5.1 mmol/L 3.8 3.7 3.2(L)  Chloride 101  - 111 mmol/L 101 102 105  CO2 22 - 32 mmol/L 38(H) 35(H) 35(H)  Calcium 8.9 - 10.3 mg/dL 7.8(L) 8.0(L) 7.5(L)   Assessment/Plan: Ms. SHANIQWA HORSMAN is a 66 y.o. woman with PMH chronic pain, depression, bipolar disorder, osteoporosis with bisphosphonate noncompliance admitted for fall with prolonged down time resulting in left hip fracture and rhabdomyolysis.    Principal Problem:   Closed displaced intertrochanteric fracture of left femur (HCC) Active Problems:   Chronic back pain   Osteoporosis   Bipolar I disorder, most recent episode depressed (HCC)   COPD (chronic obstructive pulmonary disease) with emphysema (HCC)   Protein-calorie malnutrition, severe (HCC)   Constipation   C3 cervical fracture (HCC)   Right thyroid nodule  Closed displaced intertrochanteric fracture of left femur, fell at home and found down for approx 2 days. Has history of frequent falls and fragility fractures. Previously resided at [facility]. Takes scheduled opioids, from [facility] clinic per patient but no opioid Rx visible on [place] database since 2016, denies alcohol use. Underwent intramedullary implant / ORIF on 3/30 via Dr. Erlinda Hong. Now POD4 and progressing well. -- Appreciate ortho recs -- Norco 5-325 mg 1-2 tab Q4h PRN -- Dc Oxycodone  5 mg Q6h PRN -- PT/OT -- Planned for SNF -- Will Rx to receive post-op 10 days of Lovenox for DVT prophylaxis   Rhabdomyolysis, improving, CK 600s from 3700 on admission, due to prolonged time down after fall and hip fracture at home. No renal dysfunction with normal BUN and creatinine. Diffuse myalgia and tenderness to palpation improving.  -- Encouraged po intake  Constipation, reports no BM in days and on multiple opioid medications -- Increase Senna-S to 2 tablets BID -- Miralax BID sch  Malnutrition, cachectic and weights only 92 lbs, albumin 3.2, suspect very poor nutritional status. Vit B12 normal, folate normal -- Ensure BID between meals  Bipolar disorder --  continue home Venlafaxine, Lamictal, and Zyprexa   COPD, oxygen dependent 3L at home, Gold stage 3 -- Duoneb Q4h scheduled -- Albuterol Q4h PRN -- Dulera BID -- Continue supplemental O2 2-3L, maintain SpO2 > 92%  Chronic back pain, reports using hydrocodone every 6 hours scheduled at home, receives this via [facility], for some reason this is not visible in [place] database.  -- Confirmed her prescriptions at pain clinic, discussed that she should minimize her use of these medications in the future as they increase her fall risk -- home Gabapentin 300 mg QHS  -- pain regimen per above  Osteoporosis, with fragility fracture with fall from standing, noncompliant with weekly Alendronate previously -- Will dc on Alendronate 10 mg daily  Right thyroid nodule, 14 mm, normal TSH -- Outpatient surveillance, obtain follow up US in 3-6 months  DVT Lovenox Diet HH DNR  Dispo: Anticipated discharge in approximately 0-1 day(s).   LOS: 5 days   Asencion Partridge, MD 06/14/2016, 8:27 AM Pager: [phone]

## 2016-06-14 NOTE — Telephone Encounter (Signed)
Attempted to call lovenox in pharmacy requesting ICD 10 code please add code

## 2016-06-14 NOTE — Progress Notes (Signed)
Internal Medicine Attending:   I saw and examined the patient. I reviewed the resident's note and I agree with the resident's findings and plan as documented in the resident's note.  Patient doing well post operatively from left hip fracture. Pain is well controlled. Plan for DVT prophylaxis with lovenox for at least 10 days. She has a history of a large bleeding duodenal ulcer that was treated endoscopically last year. For this risk, I would avoid extended DVT prophylaxis in this case. We also discussed osteoporosis today and decided to initiate alendronate daily, hopefully to improve her compliance. Plan is for SNF discharge when bed available.

## 2016-06-14 NOTE — Progress Notes (Signed)
Report called to Bonnita Levan at [facility]. All questions/concerns addressed. IV removed and belongings gathered. [facility]AR to transport. Will continue to monitor

## 2016-06-21 NOTE — Telephone Encounter (Signed)
ICD 10 code called into pharmacy

## 2016-06-27 ENCOUNTER — Ambulatory Visit (INDEPENDENT_AMBULATORY_CARE_PROVIDER_SITE_OTHER): Payer: Medicare Other | Admitting: Internal Medicine

## 2016-06-27 VITALS — BP 141/65 | HR 82 | Temp 98.2°F | Ht 61.0 in | Wt 94.0 lb

## 2016-06-27 DIAGNOSIS — W19XXXD Unspecified fall, subsequent encounter: Secondary | ICD-10-CM

## 2016-06-27 DIAGNOSIS — E041 Nontoxic single thyroid nodule: Secondary | ICD-10-CM

## 2016-06-27 DIAGNOSIS — G8929 Other chronic pain: Secondary | ICD-10-CM

## 2016-06-27 DIAGNOSIS — K5900 Constipation, unspecified: Secondary | ICD-10-CM

## 2016-06-27 DIAGNOSIS — Z681 Body mass index (BMI) 19 or less, adult: Secondary | ICD-10-CM | POA: Diagnosis not present

## 2016-06-27 DIAGNOSIS — M80052D Age-related osteoporosis with current pathological fracture, left femur, subsequent encounter for fracture with routine healing: Secondary | ICD-10-CM | POA: Diagnosis not present

## 2016-06-27 DIAGNOSIS — E43 Unspecified severe protein-calorie malnutrition: Secondary | ICD-10-CM | POA: Diagnosis not present

## 2016-06-27 DIAGNOSIS — L89324 Pressure ulcer of left buttock, stage 4: Secondary | ICD-10-CM | POA: Diagnosis not present

## 2016-06-27 DIAGNOSIS — S72142A Displaced intertrochanteric fracture of left femur, initial encounter for closed fracture: Secondary | ICD-10-CM

## 2016-06-27 DIAGNOSIS — M8008XD Age-related osteoporosis with current pathological fracture, vertebra(e), subsequent encounter for fracture with routine healing: Secondary | ICD-10-CM | POA: Diagnosis not present

## 2016-06-27 DIAGNOSIS — Z9181 History of falling: Secondary | ICD-10-CM | POA: Diagnosis not present

## 2016-06-27 DIAGNOSIS — S72142D Displaced intertrochanteric fracture of left femur, subsequent encounter for closed fracture with routine healing: Secondary | ICD-10-CM | POA: Diagnosis not present

## 2016-06-27 DIAGNOSIS — M810 Age-related osteoporosis without current pathological fracture: Secondary | ICD-10-CM

## 2016-06-27 MED ORDER — ONDANSETRON HCL 4 MG PO TABS: 4.0000 mg | ORAL_TABLET | Freq: Three times a day (TID) | ORAL | 0 refills | Status: AC | PRN

## 2016-06-27 MED ORDER — IPRATROPIUM-ALBUTEROL 0.5-2.5 (3) MG/3ML IN SOLN: RESPIRATORY_TRACT | 3 refills | Status: AC

## 2016-06-27 NOTE — Progress Notes (Signed)
   CC: HFU for hip fracture  HPI:  [name] is a 66 y.o. female with a past medical history listed below here today for follow up of her recent hospitalization for fall with resultant hip fracture.   For details of today's visit and the status of her chronic medical issues please refer to the assessment and plan.   Past Medical History:  Diagnosis Date  . Anemia   . Anxiety   . Arthritis    "all in my body, primarily left hip" (11/11/2014)  . Bipolar disorder ([place])   . Chronic lower back pain   . Chronic pain syndrome    follows at [facility]  . COPD (chronic obstructive pulmonary disease) (HCC)    oxygen dependent (3L home continuous)  . DEFICIENCY, VITAMIN D NOS 01/03/2007  . Depression   . Duodenal ulcer, chronic   . Elevated liver function tests   . History of blood transfusion 03/2014; 10/2014   "low HgB" (11/11/2014)  . Hyperthyroidism    "borderline"  . On home oxygen therapy    "3L; 24/7" (8/30//2016)  . OSTEOPOROSIS 06/17/2009   DEXA 05/2009 : L femur -2.9; R femur -2.5. Alendronate on med list but not taking. Needs addressed ASAP as h/o fractures.    . Pneumonia X 1?  . Sciatic pain   . Shortness of breath    "all the time" (02/27/2013)  . Substance abuse     narcotics, alcohol, tobacco  . Suicidal ideation 2007    attempted overdose 2012  Dr. Tivis Ringer report    Review of Systems:   See HPI  Physical Exam:  Vitals:   06/27/16 1504  BP: (!) 141/65  Pulse: 82  Temp: 98.2 F (36.8 C)  TempSrc: Oral  SpO2: 100%  Weight: 94 lb (42.6 kg)  Height: 5\' 1"  (1.549 m)   Physical Exam  Constitutional:  Thin, frail, chronically ill appearing female in no acute distress  Cardiovascular: Normal rate and regular rhythm.   Pulmonary/Chest: Effort normal. No respiratory distress. She has rales. She exhibits no tenderness.  Musculoskeletal: She exhibits tenderness.  Skin:  2.5 x 2.5 cm stage 4 pressure ulcer on left buttocks. No purulent  drainage, erythema or warmth. Undermining edges.   Vitals reviewed.    Assessment & Plan:   See Encounters Tab for problem based charting.  Patient discussed with Dr. Evette Doffing

## 2016-06-27 NOTE — Patient Instructions (Addendum)
[name],   It is going to be very important for the healing process that you get your protein intake up. I know you do not like the protein shakes but we need your protein levels high to help with the healing process.   I am going to send in a prescription for zofran to use as needed for the nausea.   We will work on getting you set up to have home health care, physical therapy and transportation.   Please follow up with wound care and the surgeon.   I would like to see you back in clinic in a week for follow up.

## 2016-06-28 ENCOUNTER — Inpatient Hospital Stay (INDEPENDENT_AMBULATORY_CARE_PROVIDER_SITE_OTHER): Payer: Medicaid Other | Admitting: Orthopaedic Surgery

## 2016-06-28 ENCOUNTER — Encounter (HOSPITAL_BASED_OUTPATIENT_CLINIC_OR_DEPARTMENT_OTHER): Payer: Medicare Other | Attending: Surgery

## 2016-06-28 DIAGNOSIS — X58XXXA Exposure to other specified factors, initial encounter: Secondary | ICD-10-CM | POA: Diagnosis not present

## 2016-06-28 DIAGNOSIS — L8990 Pressure ulcer of unspecified site, unspecified stage: Secondary | ICD-10-CM | POA: Insufficient documentation

## 2016-06-28 DIAGNOSIS — F17210 Nicotine dependence, cigarettes, uncomplicated: Secondary | ICD-10-CM | POA: Insufficient documentation

## 2016-06-28 DIAGNOSIS — Z7951 Long term (current) use of inhaled steroids: Secondary | ICD-10-CM | POA: Insufficient documentation

## 2016-06-28 DIAGNOSIS — Z79899 Other long term (current) drug therapy: Secondary | ICD-10-CM | POA: Insufficient documentation

## 2016-06-28 DIAGNOSIS — S81811A Laceration without foreign body, right lower leg, initial encounter: Secondary | ICD-10-CM | POA: Insufficient documentation

## 2016-06-28 DIAGNOSIS — K219 Gastro-esophageal reflux disease without esophagitis: Secondary | ICD-10-CM | POA: Insufficient documentation

## 2016-06-28 DIAGNOSIS — J449 Chronic obstructive pulmonary disease, unspecified: Secondary | ICD-10-CM | POA: Diagnosis not present

## 2016-06-28 DIAGNOSIS — Z7901 Long term (current) use of anticoagulants: Secondary | ICD-10-CM | POA: Diagnosis not present

## 2016-06-28 DIAGNOSIS — L89323 Pressure ulcer of left buttock, stage 3: Secondary | ICD-10-CM | POA: Diagnosis not present

## 2016-06-28 DIAGNOSIS — L89150 Pressure ulcer of sacral region, unstageable: Secondary | ICD-10-CM | POA: Diagnosis not present

## 2016-06-28 DIAGNOSIS — E440 Moderate protein-calorie malnutrition: Secondary | ICD-10-CM | POA: Insufficient documentation

## 2016-06-28 NOTE — Assessment & Plan Note (Signed)
[name] has complaints of wounds on her buttocks. On exam she has a significant pressure ulcer on her left lower buttocks. Approximately 2.5 x 2.5 cm ulcer is present. Did not measure depth. No active drainage. No erythema or warmth. Significant undermining was present. Stage 4 pressure ulcer.   We irrigated the wound with normal saline and packed and dress the wound today.   Plan: She has wound care already set up for tomorrow morning.

## 2016-06-28 NOTE — Assessment & Plan Note (Signed)
Report BM every other day. Denies taking anything for constipation. Reports she does not have any issues with constipation. Does have miralax listed on her medications as well as senna. Denies any abdominal pain. Does endorse nausea.   Plan: Continue to monitor. Encouraged senna or miralax use if needed at home.

## 2016-06-28 NOTE — Assessment & Plan Note (Signed)
Incidentally found on imaging. 14 mm nodule on her right thyroid. TSH was wnl. Asymptomatic.   Plan:  Will address at follow up visit.

## 2016-06-28 NOTE — Assessment & Plan Note (Signed)
History of osteoporosis by DEXA imaging in the past. Recent fragility fracture with hip and cervical spine fracture. Re-started on alendronate 10 mg daily during recent hospitalization. Reports compliance.   Plan: Appears to have been noncompliant with bisphosphate therapy in the past. Continue alendronate for 5 years if able to tolerate.

## 2016-06-28 NOTE — Assessment & Plan Note (Signed)
Patient chronically ill and frail in appearance. She has significant muscle wasting on exam. BMI 17.7 today and has continued to lose weight since hospitalization. She was started on Ensure Enlive supplement twice daily during hospitalization. Reports today that she does not like any of the shakes and has not been taking any supplements. Stressed the importance of increasing her nutrition status. Reluctantly agreed to try the Ensure.   Plan: Continue Ensure as much as tolerated

## 2016-06-28 NOTE — Assessment & Plan Note (Signed)
[name] was recently admitted from 3/29-4/3 following a fall at home with resultant left femur fracture. She underwent surgical repair with left hip repair. She did well following surgery and was discharged to a subacute rehabilitation center for therapy. She was started on alendronate 10 mg daily for newly diagnosed osteoporosis.   Reports that she was discharged from SNF this past Friday (3 days ago) and has been back at home. Her sister has been staying with her for the past 2 days but plans on returning home tomorrow. She reports that she has not had any one contact her about support at home.   On discussion with patient and her sister it appears that she had been living at an assisted living facility but did not like it there and moved back out on her own. Almost immediatly upon moving on her own she sustained a fall and was reportedly on the ground for 2 days before being found.   She is still having significant difficulties at home. Reports she has a lot of pain with standing is extremely limited in her mobility. Has significant pain. She is on chronic opiate therapy and managed by a pain clinic. Her dosing was increased in the hospital for her acute pain and appears she is still taking the increased dosing.   She also reports she has no transportation arranged and has no way of getting to future appointments once her sister goes back home.  Plan: Will put in new referral for home health. Patient has numerous health issues and will require an aide. She also needs home health PT.   Has follow up with orthopedic surgery tomorrow - sister reports she will be able to take her to that appointment.

## 2016-06-28 NOTE — Assessment & Plan Note (Signed)
Encouraged follow up with her pain clinic.

## 2016-06-29 ENCOUNTER — Emergency Department (HOSPITAL_COMMUNITY)
Admission: EM | Admit: 2016-06-29 | Discharge: 2016-06-29 | Disposition: A | Discharge status: Home / Self Care | Payer: Medicare Other | Attending: Emergency Medicine | Admitting: Emergency Medicine

## 2016-06-29 ENCOUNTER — Encounter (HOSPITAL_COMMUNITY): Payer: Self-pay | Admitting: Family Medicine

## 2016-06-29 DIAGNOSIS — L89324 Pressure ulcer of left buttock, stage 4: Secondary | ICD-10-CM | POA: Diagnosis not present

## 2016-06-29 DIAGNOSIS — Z8781 Personal history of (healed) traumatic fracture: Secondary | ICD-10-CM

## 2016-06-29 DIAGNOSIS — Z9889 Other specified postprocedural states: Secondary | ICD-10-CM

## 2016-06-29 DIAGNOSIS — J449 Chronic obstructive pulmonary disease, unspecified: Secondary | ICD-10-CM | POA: Diagnosis not present

## 2016-06-29 DIAGNOSIS — F17210 Nicotine dependence, cigarettes, uncomplicated: Secondary | ICD-10-CM | POA: Insufficient documentation

## 2016-06-29 DIAGNOSIS — L89314 Pressure ulcer of right buttock, stage 4: Secondary | ICD-10-CM

## 2016-06-29 DIAGNOSIS — M25552 Pain in left hip: Secondary | ICD-10-CM | POA: Insufficient documentation

## 2016-06-29 DIAGNOSIS — L89152 Pressure ulcer of sacral region, stage 2: Secondary | ICD-10-CM | POA: Diagnosis not present

## 2016-06-29 DIAGNOSIS — L89329 Pressure ulcer of left buttock, unspecified stage: Secondary | ICD-10-CM | POA: Diagnosis present

## 2016-06-29 MED ORDER — OXYCODONE-ACETAMINOPHEN 5-325 MG PO TABS
1.0000 | ORAL_TABLET | Freq: Once | ORAL | Status: AC
  Administered 2016-06-29: 1 via ORAL
  Filled 2016-06-29: qty 1

## 2016-06-29 NOTE — Discharge Instructions (Signed)
Please read instructions below. You can expect a visit tomorrow morning from home health to assist you with your wound care, among other things.   It is important that you reschedule and attend an appointment with your orthopedic surgeon to follow up on your surgery.   You have an appointment with the [facility] on July 05, 2016 at 10:15am.   Below are wound care instructions as described by the wound care clinic:  Change dressings everyday. You can wash wounds with soap and water in the shower prior to dressing changes. Apply a thin layer of Santyl ointment (as prescribed by wound care clinic) to the wound bed once a day. Apply foam dressing over the wound bed. Make sure to reposition yourself every 2 hours to avoid extended pressure on your wounds.

## 2016-06-29 NOTE — ED Notes (Signed)
Pt transported back home via [facility]. ?

## 2016-06-29 NOTE — ED Triage Notes (Signed)
Per EMS, pt from home complains of severe left hip pain. Pt had hip surgery 1 month ago for broken hip. Pt also noticed bed sores on left buttocks since the surgery.   Pt has been taking care of herself at home without help and is concerned about wound care. Pt has been walking with walker. Pt has hx of COPD, is on 3L O2 at home.

## 2016-06-29 NOTE — Progress Notes (Signed)
Internal Medicine Clinic Attending  Case discussed with Dr. Boswell at the time of the visit.  We reviewed the resident's history and exam and pertinent patient test results.  I agree with the assessment, diagnosis, and plan of care documented in the resident's note.  

## 2016-06-29 NOTE — Progress Notes (Signed)
CSW met with pt and provided [place] Transportation resources for pt's with disabilities/Medicaid.  CSW also provided pt with information on the [facility] for seniors needing rides in [place].  Pt was appreciative and thanked the CSW.  Please reconsult if future social work needs arise.  CSW signing off.  Alphonse Guild. Rosendo Couser, Latanya Presser, LCAS Clinical Social Worker Ph: [phone]

## 2016-06-29 NOTE — ED Provider Notes (Signed)
[facility] DEPT Provider Note   CSN: [id number] Arrival date & time: 06/29/16  1515     History   Chief Complaint No chief complaint on file.   HPI [name] is a 66 y.o. female.  Pt s/p ORIF of L femur on 0/26/37 without complications, presents w pressure ulcers L buttock and sacrum. Pt does not know when ulcers appeared, but complains of sharp pain that is worse w movement and improved w PTA Hydrocodone. Does not know if wound is draining. Per chart, pt had outpt visit with internal medicine 06/27/16, wound L buttock measured 2.5cm by 2.5cm, wound was packed. Pt had wound care appointment yesterday; pt unaware of wound care instructions, however does have printed wound care instructions, from her visit yesterday, with her today. Also reports L hip pain that is equal in severity to the pain assoc w wounds. Reports pain is worse w movement, though she has no trouble ambulating at home with walker. Pt also had orthopedic f/u appt yesterday but did not attend. Pt reports chills, no fever.       Past Medical History:  Diagnosis Date  . Anemia   . Anxiety   . Arthritis    "all in my body, primarily left hip" (11/11/2014)  . Bipolar disorder ([place])   . Chronic lower back pain   . Chronic pain syndrome    follows at [facility]  . COPD (chronic obstructive pulmonary disease) (HCC)    oxygen dependent (3L home continuous)  . DEFICIENCY, VITAMIN D NOS 01/03/2007  . Depression   . Duodenal ulcer, chronic   . Elevated liver function tests   . History of blood transfusion 03/2014; 10/2014   "low HgB" (11/11/2014)  . Hyperthyroidism    "borderline"  . On home oxygen therapy    "3L; 24/7" (8/30//2016)  . OSTEOPOROSIS 06/17/2009   DEXA 05/2009 : L femur -2.9; R femur -2.5. Alendronate on med list but not taking. Needs addressed ASAP as h/o fractures.    . Pneumonia X 1?  . Sciatic pain   . Shortness of breath    "all the time" (02/27/2013)  . Substance abuse    narcotics, alcohol, tobacco  . Suicidal ideation 2007    attempted overdose 2012  Dr. Tivis Ringer report    Patient Active Problem List   Diagnosis Date Noted  . Pressure ulcer 06/28/2016  . Right thyroid nodule 06/13/2016  . C3 cervical fracture ([place]) 06/11/2016  . Closed displaced intertrochanteric fracture of left femur ([place]) 06/09/2016  . Wrist drop, left 03/10/2016  . Acute exacerbation of chronic obstructive pulmonary disease (COPD) ([place]) 05/06/2015  . Constipation   . Orthostatic hypotension 11/11/2014  . Duodenal ulcer   . Polypharmacy 10/24/2014  . Smoking 10/24/2014  . Trochanteric bursitis of left hip 10/01/2014  . Rash and nonspecific skin eruption 09/17/2014  . Left hip pain 09/17/2014  . Immunization, tetanus-diphtheria 09/05/2014  . Iron deficiency anemia 11/12/2013  . Health care maintenance 05/14/2013  . Nausea and vomiting 04/17/2013  . Insomnia 03/06/2013  . GERD (gastroesophageal reflux disease) 03/06/2013  . Protein-calorie malnutrition, severe ([place]) 03/04/2013  . Chronic pain 03/02/2013  . Dyslipidemia 02/26/2013  . Seasonal allergies 07/09/2012  . 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 Osteoporosis 06/17/2009  . Bipolar I disorder, most recent episode depressed ([place]) 06/02/2009  . Vitamin D deficiency 01/03/2007  . Chronic back pain 12/07/2005    Past Surgical History:  Procedure Laterality Date  . ANKLE  DEBRIDEMENT Left 10/2010  . AUGMENTATION MAMMAPLASTY  ~ 2007  . CESAREAN SECTION  1974; 1979  . ESOPHAGOGASTRODUODENOSCOPY N/A 11/07/2014   Procedure: ESOPHAGOGASTRODUODENOSCOPY (EGD);  Surgeon: Irene Shipper, MD;  Location[facility] END[facility]COPY;  Service: Endoscopy;  Laterality: N/A;  . FRACTURE SURGERY    . INTRAMEDULLARY (IM) NAIL INTERTROCHANTERIC Left 06/10/2016   Procedure: LEFT FEMUR INTRAMEDULLARY (IM) NAIL INTERTROCHANTRIC;  Surgeon: Leandrew Koyanagi, MD;  Location: [facility];  Service: Orthopedics;  Laterality: Left;    . ORIF ANKLE FRACTURE Left 10/2010  . TUBAL LIGATION  1979    OB History    No data available       Home Medications    Prior to Admission medications   Medication Sig Start Date End Date Taking? Authorizing Provider  Fluticasone-Salmeterol (ADVAIR DISKUS) 250-50 MCG/DOSE AEPB INHALE 1 PUFF INTO THE LUNGS 2 TIMES DAILY 03/19/14  Yes Jones Bales, MD  HYDROcodone-acetaminophen (NORCO/VICODIN) 5-325 MG tablet Take 1-2 tablets by mouth every 4 (four) hours as needed for moderate pain. 06/13/16  Yes Asencion Partridge, MD  ipratropium-albuterol (DUONEB) 0.5-2.5 (3) MG/3ML SOLN 4 times a day (breakfast, lunch, dinner, bedtime).Can take 2 additional treatments if needed on bad days. DX J43.8 06/27/16  Yes Maryellen Pile, MD  lamoTRIgine (LAMICTAL) 100 MG tablet TAKE ONE TABLET EVERY DAY 09/23/14  Yes Sid Falcon, MD  Melatonin 10 MG TABS Take 1 tablet by mouth at bedtime as needed. Patient taking differently: Take 1 tablet by mouth at bedtime.  05/26/15  Yes Bartholomew Crews, MD  alendronate (FOSAMAX) 10 MG tablet Take 1 tablet (10 mg total) by mouth daily before breakfast. Take with a full glass of water on an empty stomach. 06/13/16   Asencion Partridge, MD  Cholecalciferol 1000 units TBDP Take 1,000 mg by mouth daily.    Historical Provider, MD  enoxaparin (LOVENOX) 40 MG/0.4ML injection Inject 0.4 mLs (40 mg total) into the skin daily. Until 06/20/2016 06/13/16 06/21/16  Asencion Partridge, MD  feeding supplement, ENSURE ENLIVE, (ENSURE ENLIVE) LIQD Take 237 mLs by mouth 2 (two) times daily between meals. Patient not taking: Reported on 06/29/2016 06/13/16   Asencion Partridge, MD  fluticasone [facility]) 50 MCG/ACT nasal spray Place 2 sprays into both nostrils daily.    Historical Provider, MD  gabapentin (NEURONTIN) 300 MG capsule Take 300 mg by mouth at bedtime.  06/25/14   Historical Provider, MD  guaiFENesin-dextromethorphan (ROBITUSSIN DM) 100-10 MG/5ML syrup Take 5 mLs by mouth every 4 (four) hours as needed for  cough. Patient not taking: Reported on 06/29/2016 02/04/15   Riccardo Dubin, MD  levocetirizine (XYZAL) 5 MG tablet Take 5 mg by mouth at bedtime.     Historical Provider, MD  OLANZapine (ZYPREXA) 2.5 MG tablet Take 2.5 mg by mouth at bedtime.    Historical Provider, MD  ondansetron (ZOFRAN) 4 MG tablet Take 1 tablet (4 mg total) by mouth every 8 (eight) hours as needed for nausea or vomiting. 06/27/16   Maryellen Pile, MD  OXYGEN Inhale 3 L into the lungs continuous.    Historical Provider, MD  pantoprazole (PROTONIX) 40 MG tablet Take 1 tablet (40 mg total) by mouth 2 (two) times daily. 11/09/14   Burgess Estelle, MD  polyethylene glycol powder (GLYCOLAX/MIRALAX) powder TAKE 17G DISSOLVED IN 8OZ WATER DAILY ASNEEDED FOR MODERATE CONSTIPATION 09/23/14   Sid Falcon, MD  PROAIR HFA 108 (90 BASE) MCG/ACT inhaler INHALE ONE PUFF BY MOUTH EVERY 2 TO 4 HOURS AS NEEDED FOR WHEEZING 05/21/14  Riccardo Dubin, MD  senna (SENOKOT) 8.6 MG TABS tablet Take 2 tablets (17.2 mg total) by mouth 3 (three) times daily as needed for mild constipation. 11/13/14   Jule Ser, DO  Spacer/Aero-Holding Chambers (AEROCHAMBER Z-STAT PLUS/MEDIUM) inhaler Use with rescue inhaler 05/08/15   Zada Finders, MD  venlafaxine XR (EFFEXOR-XR) 37.5 MG 24 hr capsule Take 37.5 mg by mouth every evening. 10/24/15   Historical Provider, MD  white petrolatum (VASELINE) GEL Apply 1 application topically daily. To both feet for dry skin    Historical Provider, MD    Family History Family History  Problem Relation Age of Onset  . Myelodysplastic syndrome Father     Died from myelofibrosis though diagnosed post-mortem  . Stroke Neg Hx   . Cancer Neg Hx     Social History Social History  Substance Use Topics  . Smoking status: Current Every Day Smoker    Packs/day: 0.50    Years: 45.00    Types: Cigarettes  . Smokeless tobacco: Never Used     Comment: 11/11/2014 ""smoking 1 cigarettes/day; that's down from 2 ppd"  . Alcohol use 0.0  oz/week     Comment: 11/11/2014 "nothing in 7 years; never had a problem w/it"     Allergies   Banana; Lyrica [pregabalin]; and Pollen extract   Review of Systems Review of Systems  Constitutional: Positive for chills. Negative for fever.  Musculoskeletal: Positive for arthralgias (L hip).       Ambulates with walker without difficulties  Skin: Positive for wound (2 pressure ulcers, L buttock and sacral).     Physical Exam Updated Vital Signs BP (!) 149/74 (BP Location: Right Arm)   Pulse 82   Temp 97.9 F (36.6 C) (Oral)   Resp 20   Ht 5\' 1"  (1.549 m)   Wt 42.6 kg   SpO2 100%   BMI 17.76 kg/m   Physical Exam  Constitutional: She appears well-developed. No distress.  HENT:  Head: Normocephalic and atraumatic.  Eyes: Conjunctivae are normal.  Cardiovascular: Normal rate.   Pulmonary/Chest: Effort normal.  Musculoskeletal:  Limited PROM and AROM 2/t pain. No crepitus, intact distal pulses.  Skin:  Decubitus ulcer L ischium: Wound is about 3cm by 3.5cm, muscle is visible at base of wound, no bone visible, wound has undermining and draining purulent fluid. Not erythematous or warm. Central sacral decubitus ulcer, 1cm by 1cm. No drainage. Areas of erythema just lateral and distal to this ulcer that appear to be in early stage of ulcer.  3 surgical wounds (L hip and distal L thigh), stapled, well-approximated, appear to be healing well, no purulent drainage.  Psychiatric: She has a normal mood and affect. Her behavior is normal.  Nursing note and vitals reviewed.    ED Treatments / Results  Labs (all labs ordered are listed, but only abnormal results are displayed) Labs Reviewed - No data to display  EKG  EKG Interpretation None       Radiology No results found.  Procedures Procedures (including critical care time)  Medications Ordered in ED Medications  oxyCODONE-acetaminophen (PERCOCET/ROXICET) 5-325 MG per tablet 1 tablet (1 tablet Oral Given 06/29/16  1739)     Initial Impression / Assessment and Plan / ED Course  I have reviewed the triage vital signs and the nursing notes.  Pertinent labs & imaging results that were available during my care of the patient were reviewed by me and considered in my medical decision making (see chart for details).  Pt s/p ORIF L femur w L hip pain and decubitus ulcers. Pt seen by wound care yesterday and sent home with detailed wound care instructions and Santyl ointment. On exam, Ulcer present on L ischium and sacrum. Wounds do not appear infected. Pt afebrile, nontoxic. Pain moderately controlled w percocet in ED. Pt expressed concerns for being unable to care for her wounds at home, and in need of help with ADLs. Consulted case management and social work; established care plan for home health follow up in the morning w RN, home aid, PT. Social work gave [employer] for transportation issues. Wound care given in ED, wounds packed w wet to dry dressing. Encouraged and expressed importance that pt reschedule her missed orthopedic surgery appt for surgery follow up and proper post-op care. Pt safe for discharge home.   Discharge Wound Care Instructions (per wound care clinic) Change dressings everyday. You can wash wounds with soap and water in the shower prior to dressing changes. Apply a thin layer of Santyl ointment (as prescribed by wound care clinic) to the wound bed once a day. Apply foam dressing over the wound bed. Make sure to reposition yourself every 2 hours to avoid extended pressure on your wounds.   Patient discussed with and seen by Dr. Leonette Monarch.  Discussed results, findings, treatment and follow up. Patient advised of return precautions. Patient verbalized understanding and agreed with plan. Final Clinical Impressions(s) / ED Diagnoses   Final diagnoses:  Decubitus ulcer of right buttock, stage 4 (HCC)  Decubitus ulcer of sacral region, stage 2  S/P ORIF (open reduction internal fixation)  fracture  Left hip pain    New Prescriptions Discharge Medication List as of 06/29/2016  8:21 PM       Martinique N Russo, PA-C 06/29/16 [place], MD 06/30/16 [phone]

## 2016-06-29 NOTE — Care Management Note (Addendum)
Case Management Note  Patient Details  Name: [name] MRN: [id number] Date of Birth: 1950-06-22  Subjective/Objective:                  Left hip pain  Action/Plan: ED CM spoke with the patient at the bedside. Patient states she lives alone. She does not have any family in the area. Reports she is unable to care of the wounds on her sacrum. She would like home health services and needs assistance with ADLs. Patient was provided with a list of home health agencies. [facility] was selected. CM faxed the home health orders and facesheet to [facility]. Contacted [facility]'s after-hours telephone number and informed John of the referral and the need for the patient to be seen by the RN tomorrow. Patients states she uses a walker at home. She reports transportation is a big problem. Referred to CSW who will see the patient in the ED. Reports she is on 3 liters of oxygen at all times. She has a Conservation officer, nature provided by [employer]. Patient will be discharged home from the ED with home health services. Discussed patient's concerns. Explained the home health agency will contact her, however, she will be provided with the telephone number in the event she needs to contact [facility]. She verbalizes understanding.  13 - ED CM received a call from [employer] the on-call nurse at [facility]. Informed Guerry Minors the patient needs to be seen by the nurse tomorrow due to wound care. Confirmed the orders have been faxed to [facility]. She states they will follow-up with the patient in the morning.    Expected Discharge Date:                  Expected Discharge Plan:  [place]  In-House Referral:  Clinical Social Work  Discharge planning Services  CM Consult  Post Acute Care Choice:  Home Health Choice offered to:  Patient  DME Arranged:  N/A DME Agency:  NA  HH Arranged:  RN, PT, Social Work, Nurse's Aide [place] Agency:  [place]  Status of Service:  Completed, signed off  If discussed at [employer], dates discussed:    Additional Comments:  Apolonio Schneiders, RN 06/29/2016, 7:02 PM

## 2016-06-29 NOTE — ED Notes (Signed)
Provided 2 [place] sandwiches, Coke, and Water with permission from provider.

## 2016-06-30 ENCOUNTER — Telehealth: Payer: Self-pay

## 2016-06-30 IMAGING — CT CT HEAD W/O CM
1 series · 16 of 30 positions shown, 20 images · non-contrast
Comparison: Brain CT 11/04/2014

CLINICAL DATA: Patient with syncopal episode

EXAM:
CT HEAD WITHOUT CONTRAST
TECHNIQUE: Contiguous axial images were obtained from the base of the skull
through the vertex without intravenous contrast.

[Series 2: head 5.0 h30s · axial · 0.46mm/px · z∈[+1170,+1320]mm · 16 of 34 slices shown, 20 images]
[im 2/34  brain]
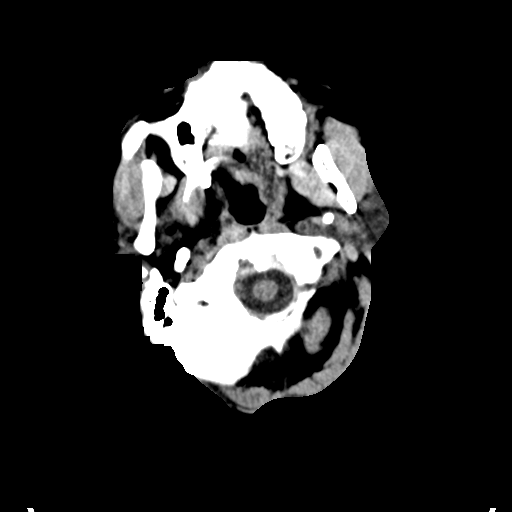
[im 2/34  bone]
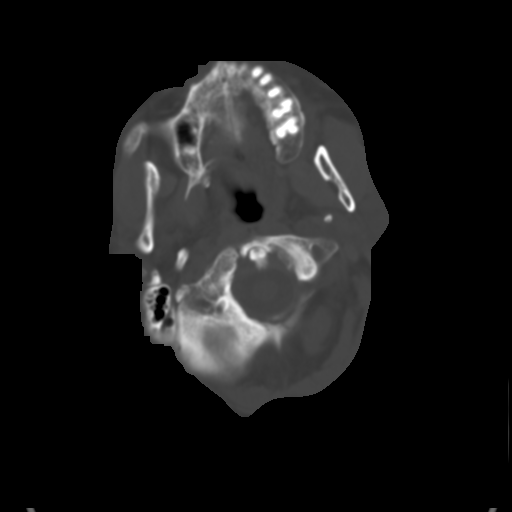
[im 4/34  brain]
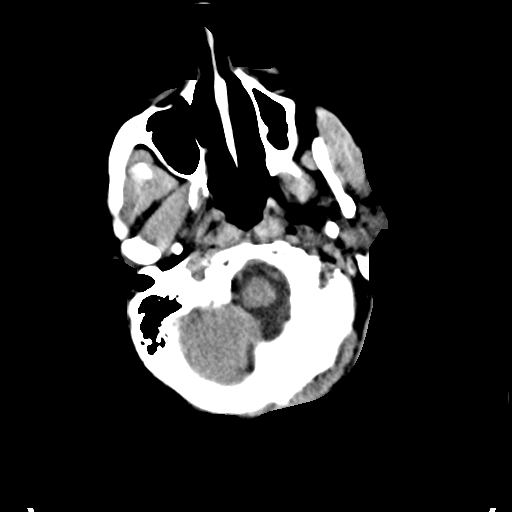
[im 6/34  brain]
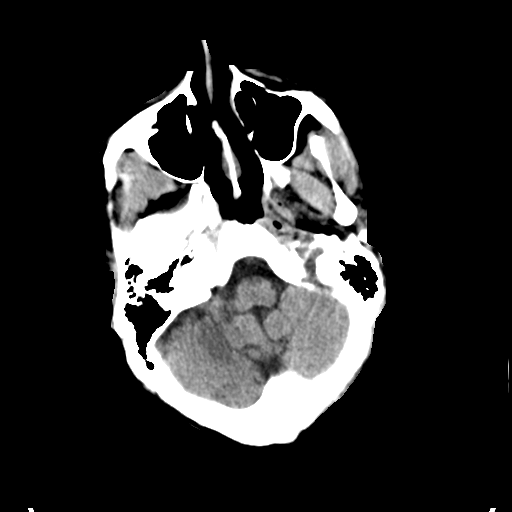
[im 8/34  brain]
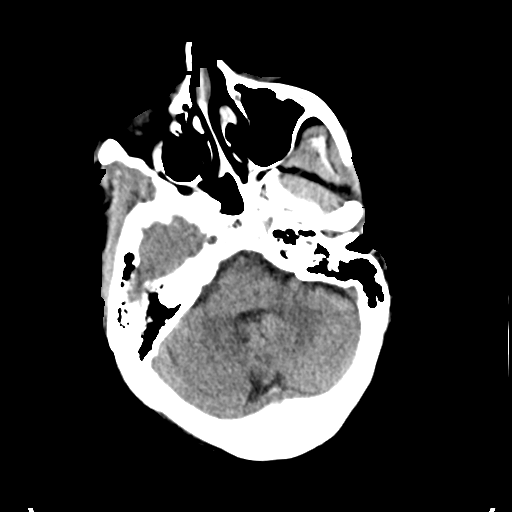
[im 10/34  brain]
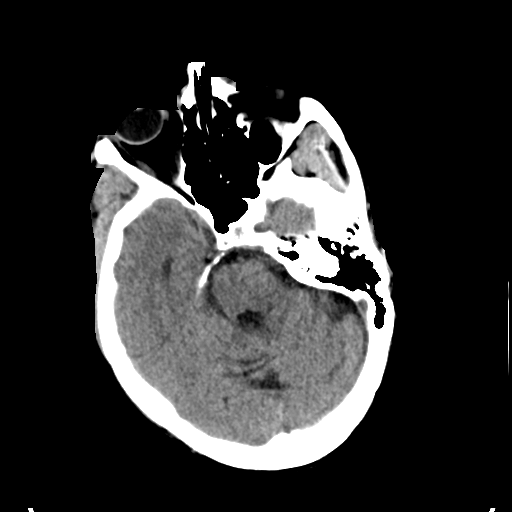
[im 10/34  bone]
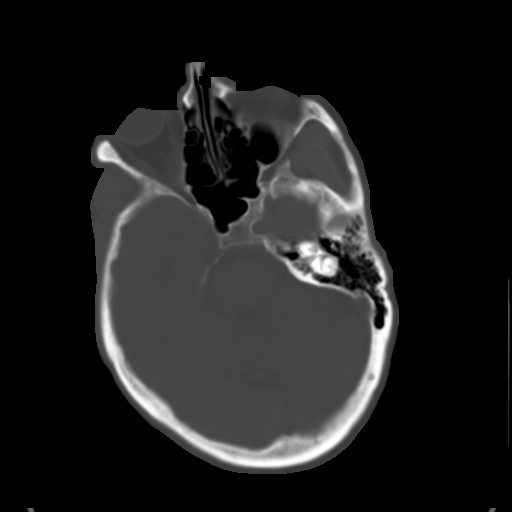
[im 12/34  brain]
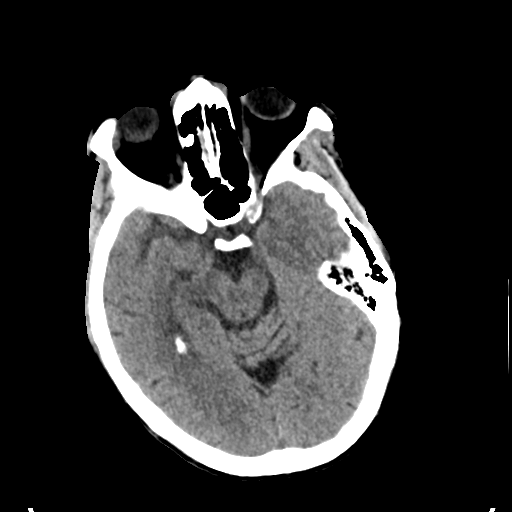
[im 14/34  brain]
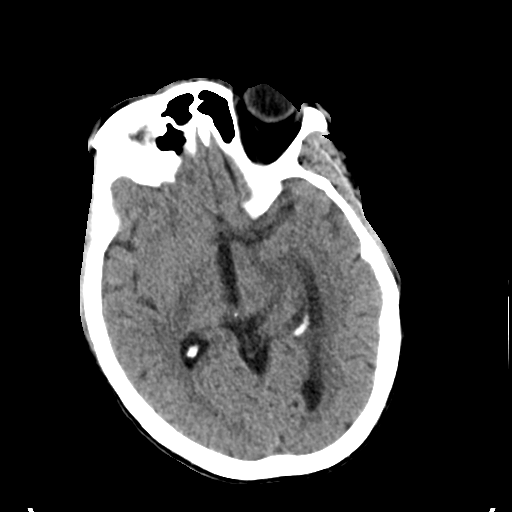
[im 16/34  brain]
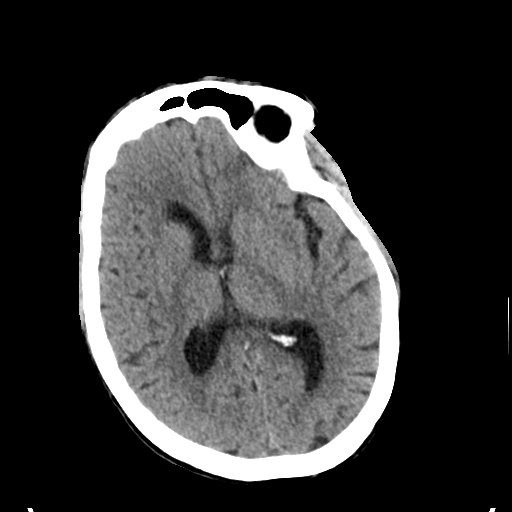
[im 18/34  brain]
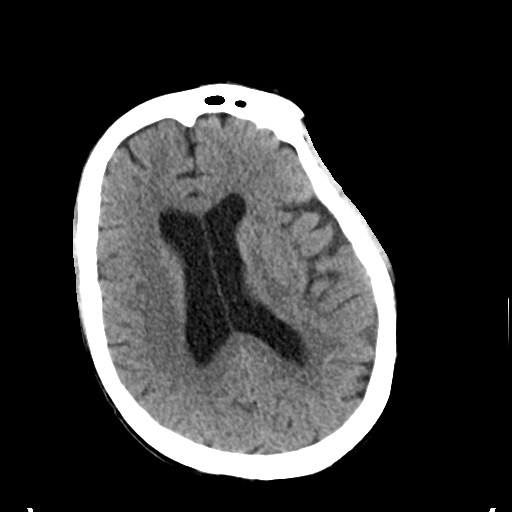
[im 18/34  bone]
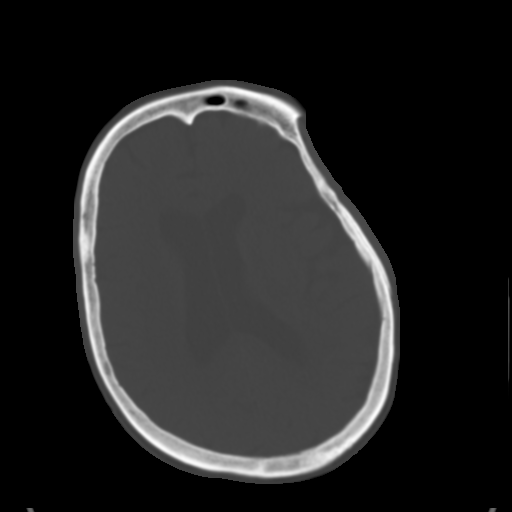
[im 20/34  brain]
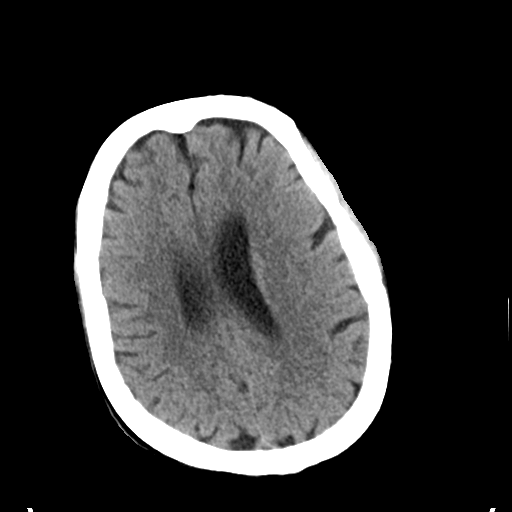
[im 22/34  brain]
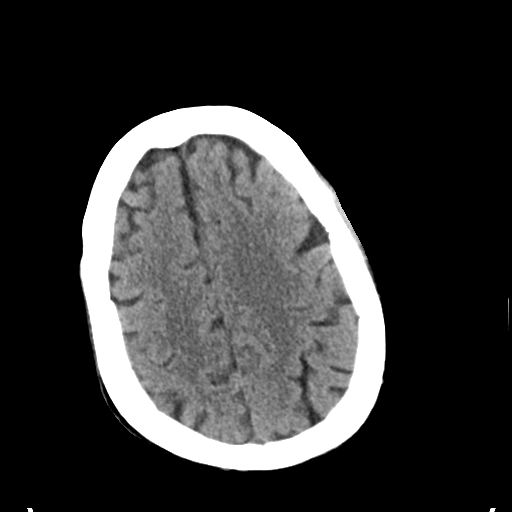
[im 24/34  brain]
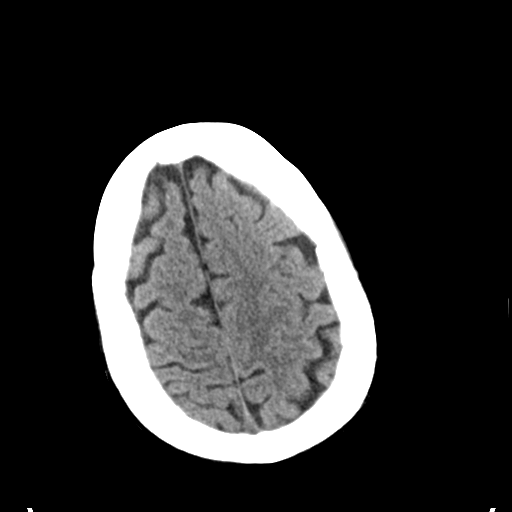
[im 26/34  brain]
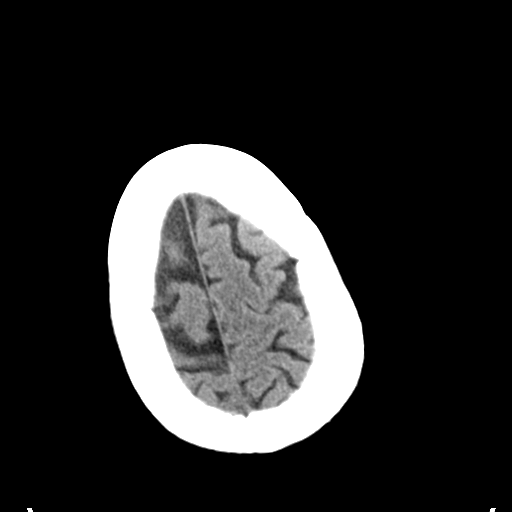
[im 26/34  bone]
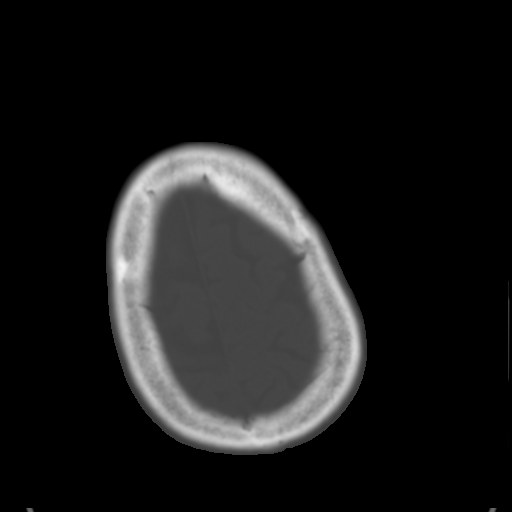
[im 28/34  brain]
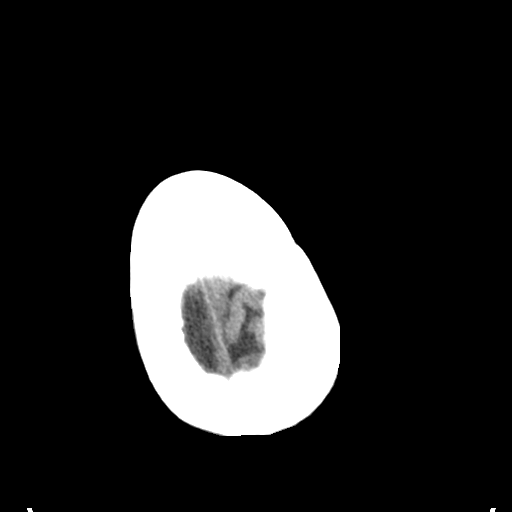
[im 30/34  brain]
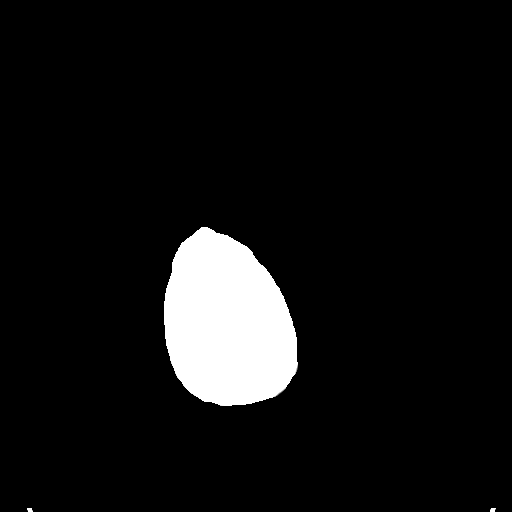
[im 32/34  brain]
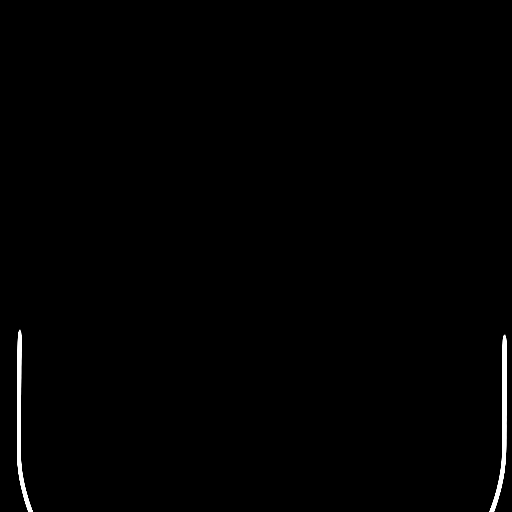

[16 of 30 positions shown; findings below may reference images not displayed]

FINDINGS: Ventricles and sulci are appropriate for patient's age. No evidence
for acute cortically based infarct, intracranial hemorrhage, mass
lesion or mass-effect. Orbits are unremarkable. Paranasal sinuses
are well aerated. Mastoid air cells are unremarkable. Calvarium is
intact.
IMPRESSION: No acute intracranial process.

## 2016-06-30 NOTE — Telephone Encounter (Signed)
Mrs. Redmond Pulling called because she feels that patient is ready to go to a rehab facility. Reports patient went to ED last night thinking she would be able to go into rehab from there. States home health nurse told her that she would assist patient with getting into a place. Requesting that I call patient to see if she would be willing to go into facility. Advised Mrs. Redmond Pulling that I will call patient and will assist her with placement if that is her desire.

## 2016-06-30 NOTE — Telephone Encounter (Signed)
Needs to speak with a nurse about pt. Please call back @ [phone].

## 2016-07-01 ENCOUNTER — Telehealth: Payer: Self-pay

## 2016-07-01 NOTE — Telephone Encounter (Signed)
I agreed on the verbal order and SNF placement.

## 2016-07-01 NOTE — Telephone Encounter (Signed)
[facility] calls for VO for wound care at home- hip-wet to dry, sacrum- foam, surgical hip/ thigh monitor healing Also PT for eval and treat for ambulation, safety and PCS for assistance with daily care also she states they are working to assist pt with snf placement soon. Do you agree?

## 2016-07-01 NOTE — Telephone Encounter (Signed)
Lattie Haw from [facility] home health requesting VO.

## 2016-07-04 ENCOUNTER — Telehealth: Payer: Self-pay | Admitting: Surgery

## 2016-07-04 NOTE — Telephone Encounter (Signed)
This has been addressed.

## 2016-07-04 NOTE — Telephone Encounter (Signed)
ED CM attempted to contact patient regarding f/u from ED visit last week for [facility] recommendations. No answer CM will make second attempt later today.  Laurena Slimmer RN BSN ED CM [phone]

## 2016-07-05 ENCOUNTER — Telehealth: Payer: Self-pay | Admitting: Surgery

## 2016-07-05 NOTE — Telephone Encounter (Signed)
ED CM contacted patient concerning [place] recommendations from ED. Patient reports she is currently at [facility]. No further CM needs identified

## 2016-07-07 IMAGING — CR DG ABD PORTABLE 1V
1 series · 1 of 1 positions shown · non-contrast
Comparison: CT 11/04/2014

CLINICAL DATA: Constipation for 1 week

EXAM:
PORTABLE ABDOMEN - 1 VIEW

[AP]
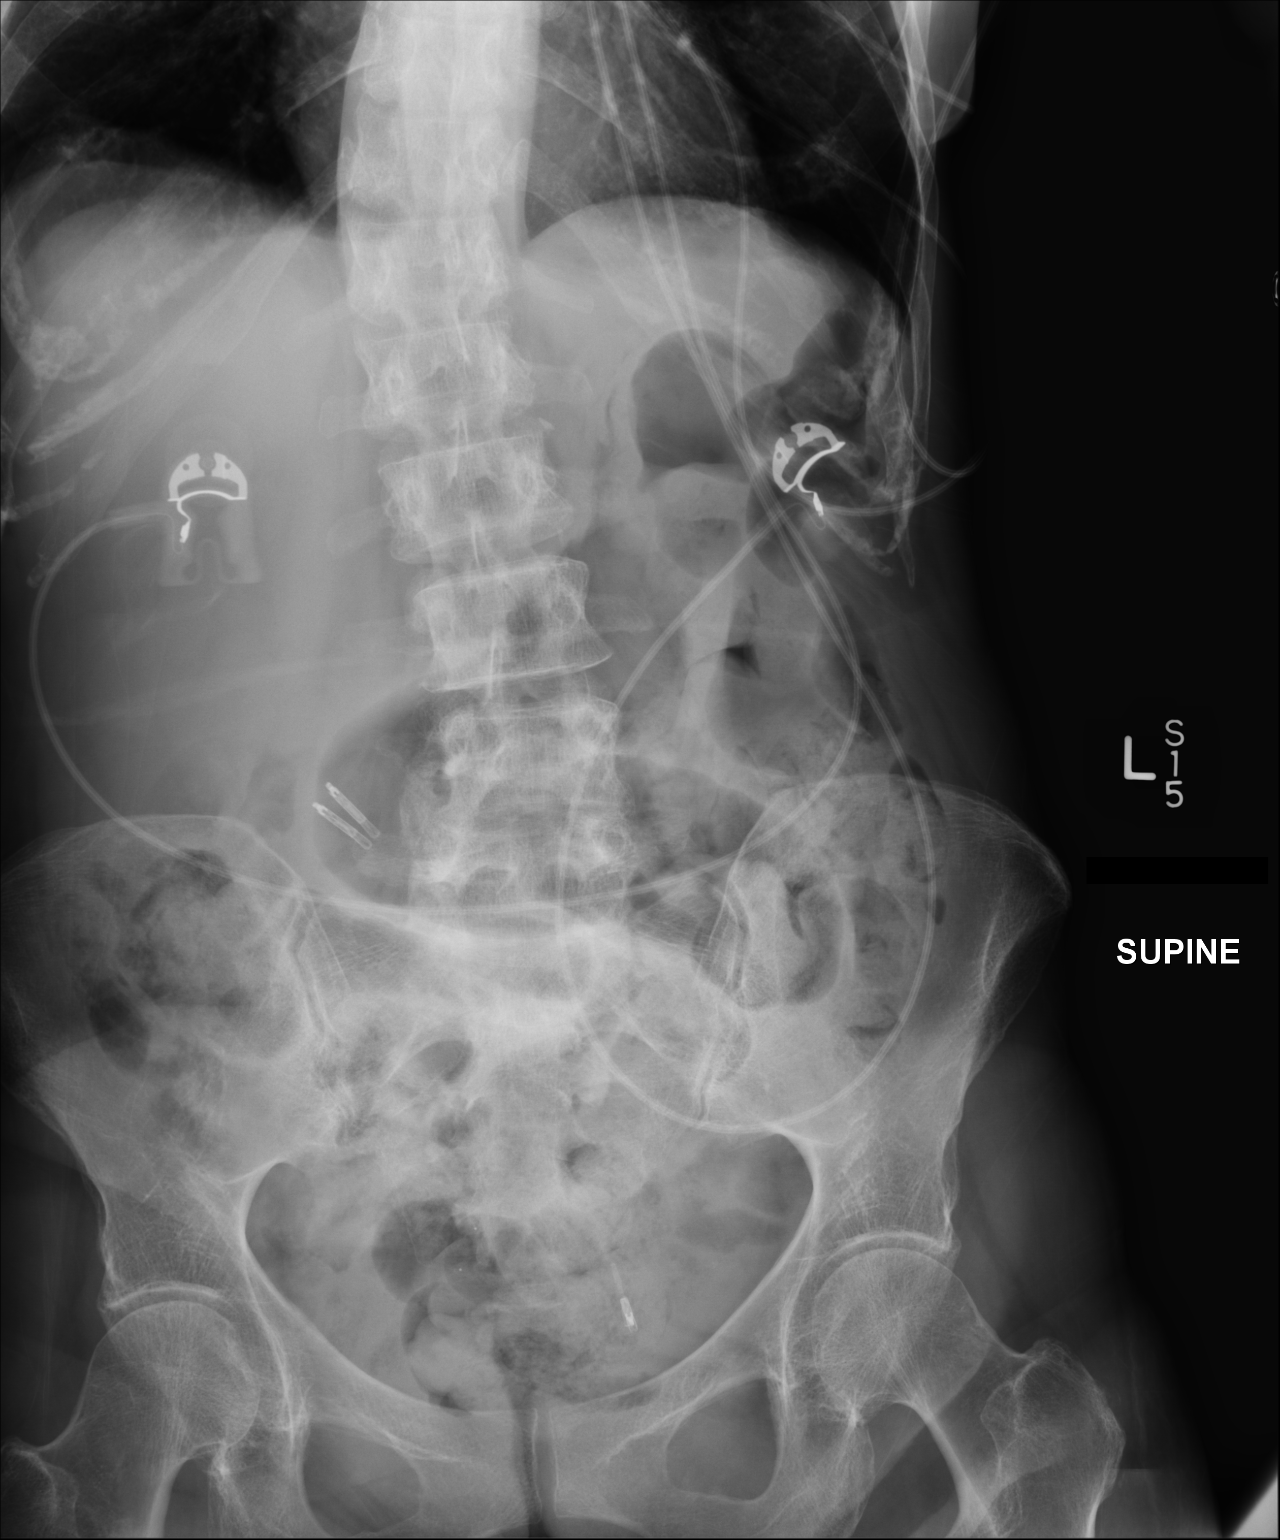

[1 of 1 positions shown; findings below may reference images not displayed]

FINDINGS: Mild stool burden. No dilated loop of bowel. S shaped thoracolumbar
scoliosis partly visualized. Presence or absence of air-fluid levels
or free air is suboptimally evaluated on this supine projection.
Presumed clothing radiopaque artifact noted over the abdomen.
IMPRESSION: Nonobstructive bowel gas pattern.  Mild stool burden.

## 2016-08-05 ENCOUNTER — Encounter (HOSPITAL_COMMUNITY): Payer: Self-pay | Admitting: Orthopaedic Surgery

## 2016-08-05 NOTE — Addendum Note (Signed)
Addendum  created 08/05/16 3403 by Sammie Bench, CRNA   Anesthesia Event edited

## 2016-08-12 NOTE — Addendum Note (Signed)
Addendum  created 08/12/16 1312 by Yvone Slape, MD   Sign clinical note    

## 2016-08-15 ENCOUNTER — Encounter: Payer: Self-pay | Admitting: *Deleted

## 2016-08-16 ENCOUNTER — Ambulatory Visit (INDEPENDENT_AMBULATORY_CARE_PROVIDER_SITE_OTHER): Payer: Medicare Other | Admitting: Internal Medicine

## 2016-08-16 VITALS — BP 133/61 | HR 77

## 2016-08-16 DIAGNOSIS — F17210 Nicotine dependence, cigarettes, uncomplicated: Secondary | ICD-10-CM

## 2016-08-16 DIAGNOSIS — L89311 Pressure ulcer of right buttock, stage 1: Secondary | ICD-10-CM | POA: Diagnosis not present

## 2016-08-16 DIAGNOSIS — J439 Emphysema, unspecified: Secondary | ICD-10-CM

## 2016-08-16 DIAGNOSIS — S72142A Displaced intertrochanteric fracture of left femur, initial encounter for closed fracture: Secondary | ICD-10-CM

## 2016-08-16 DIAGNOSIS — L89324 Pressure ulcer of left buttock, stage 4: Secondary | ICD-10-CM

## 2016-08-16 DIAGNOSIS — S72142D Displaced intertrochanteric fracture of left femur, subsequent encounter for closed fracture with routine healing: Secondary | ICD-10-CM | POA: Diagnosis not present

## 2016-08-16 DIAGNOSIS — X58XXXD Exposure to other specified factors, subsequent encounter: Secondary | ICD-10-CM

## 2016-08-16 DIAGNOSIS — Z993 Dependence on wheelchair: Secondary | ICD-10-CM

## 2016-08-16 DIAGNOSIS — Z9981 Dependence on supplemental oxygen: Secondary | ICD-10-CM

## 2016-08-16 MED ORDER — ALBUTEROL SULFATE HFA 108 (90 BASE) MCG/ACT IN AERS: INHALATION_SPRAY | RESPIRATORY_TRACT | 12 refills | Status: AC

## 2016-08-16 NOTE — Patient Instructions (Signed)
It was a pleasure to see you today [name].  I have refilled your albuterol (Proair) inhaler.  For your allergies and congestion you may try sinus irrigation or netipot type treatments, but I would not recommend steroids or stronger antihistamines due to risk of fall and injury.  We will work on your paperwork for home health aide services.  You will be set up with a new PCP after this month due to Dr. Serita Grit training completion. We will try and have you seen by them soon but may see other doctors in the interval.

## 2016-08-16 NOTE — Progress Notes (Signed)
   CC: Follow up after discharge from SNF for hip fracture stay  HPI:  [name] is a 66 y.o. woman who broke her left hip in late March who is here in follow up after completing her SNF rehab course.   See problem based assessment and plan below for additional details  Past Medical History:  Diagnosis Date  . Anemia   . Anxiety   . Arthritis    "all in my body, primarily left hip" (11/11/2014)  . Bipolar disorder ([place])   . Chronic lower back pain   . Chronic pain syndrome    follows at [facility]  . COPD (chronic obstructive pulmonary disease) (HCC)    oxygen dependent (3L home continuous)  . DEFICIENCY, VITAMIN D NOS 01/03/2007  . Depression   . Duodenal ulcer, chronic   . Elevated liver function tests   . History of blood transfusion 03/2014; 10/2014   "low HgB" (11/11/2014)  . Hyperthyroidism    "borderline"  . On home oxygen therapy    "3L; 24/7" (8/30//2016)  . OSTEOPOROSIS 06/17/2009   DEXA 05/2009 : L femur -2.9; R femur -2.5. Alendronate on med list but not taking. Needs addressed ASAP as h/o fractures.    . Pneumonia X 1?  . Sciatic pain   . Shortness of breath    "all the time" (02/27/2013)  . Substance abuse     narcotics, alcohol, tobacco  . Suicidal ideation 2007    attempted overdose 2012  Dr. Tivis Ringer report    Review of Systems:  Review of Systems  Constitutional: Negative for chills and fever.  Cardiovascular: Negative for chest pain and leg swelling.  Gastrointestinal: Negative for abdominal pain and diarrhea.  Musculoskeletal: Negative for falls.  Neurological: Negative for dizziness.    Physical Exam: Physical Exam  Constitutional:  Very frail, chronically ill appearing woman sitting in wheelchair on portable oxygen  HENT:  Mouth/Throat: Oropharynx is clear and moist. No oropharyngeal exudate.  Eyes: Conjunctivae are normal.  Cardiovascular: Normal rate and regular rhythm.   Pulmonary/Chest: Effort normal and breath sounds  normal.  Normal WOB on 3L O2 by [place]  Musculoskeletal:  5/5 strength in upper extremities, 3+/5 or 4/5 in bilateral lower extremities  Lymphadenopathy:    She has no cervical adenopathy.  Skin:  Stage 1 pressure ulcer on right buttock near gluteal fold, stage 4 pressure ulcer on left with clean borders, some nonpurulent drainage    Vitals:   08/16/16 1039  BP: 133/61  Pulse: 77  SpO2: 98%    Assessment & Plan:   See Encounters Tab for problem based charting.  Patient discussed with Dr. Lynnae January

## 2016-08-17 NOTE — Assessment & Plan Note (Addendum)
She has a full thickness ulcer on the left buttock that has been very painful to her since she spends a large amount of time sitting or supine. There is a stage 1 injury on the right side. She is wearing a pad for diversion since she has some intermittent incontinence. She is established with [facility] with home health assisting in dressing changes.  Stage 4 pressure ulcer with clean margins and  nonpurulent discharge. She will continue to follow up with wound care clinic and home health for managing these ulcers.

## 2016-08-17 NOTE — Assessment & Plan Note (Signed)
HPI: She continues to work with home health PT since completing her stay at [facility] in April. She has limiited mobility from her pain, weakness, and her acquired leg length discrepancy. The home is handicap accessible with no stairs allowing her to use a wheelchair or walker throughout the day. She is able to use the bathroom by herself, but currently requires help at home in order to get dressed, bathe, cook, clean laundry, or basic household maintenance. Her sister is no longer staying with her, limiting the amount of family assistance available.  A: Left hip fracture with many functional limitations after 2 months of rehab. She is doing well with PT. I suspect a home health aide would benefit her care and treatment due to multiple ADLs limited by her mobility.  P: Will recommend home health aide services to her PCP Continuing home PT on current pain medication

## 2016-08-17 NOTE — Progress Notes (Signed)
Internal Medicine Clinic Attending  Case discussed with Dr. Rice at the time of the visit.  We reviewed the resident's history and exam and pertinent patient test results.  I agree with the assessment, diagnosis, and plan of care documented in the resident's note.  

## 2016-08-17 NOTE — Assessment & Plan Note (Signed)
Not in acute exacerbation. She is on her home 3L O2 but needs refill orders for her albuterol inhaler. She has some upper airway complaints that are perennial and always worst in the spring and summer. This refill was sent today.

## 2016-08-22 ENCOUNTER — Ambulatory Visit (INDEPENDENT_AMBULATORY_CARE_PROVIDER_SITE_OTHER): Payer: Medicare Other | Admitting: Internal Medicine

## 2016-08-22 ENCOUNTER — Telehealth: Payer: Self-pay

## 2016-08-22 VITALS — BP 133/65 | HR 75 | Temp 98.4°F | Ht 61.0 in

## 2016-08-22 DIAGNOSIS — Z79891 Long term (current) use of opiate analgesic: Secondary | ICD-10-CM

## 2016-08-22 DIAGNOSIS — Z8781 Personal history of (healed) traumatic fracture: Secondary | ICD-10-CM | POA: Diagnosis not present

## 2016-08-22 DIAGNOSIS — M5430 Sciatica, unspecified side: Secondary | ICD-10-CM

## 2016-08-22 DIAGNOSIS — M1990 Unspecified osteoarthritis, unspecified site: Secondary | ICD-10-CM | POA: Diagnosis not present

## 2016-08-22 DIAGNOSIS — M25552 Pain in left hip: Secondary | ICD-10-CM

## 2016-08-22 DIAGNOSIS — G8929 Other chronic pain: Secondary | ICD-10-CM

## 2016-08-22 NOTE — Telephone Encounter (Signed)
Is there any way we can print off the one I completed last month, or do I need to refill a brand new one?

## 2016-08-22 NOTE — Assessment & Plan Note (Signed)
History of present illness Patient reports having chronic pain secondary to sciatica, left hip pain, and arthritis. States her pain is being managed by [facility] pain clinic for the past 5 years and she is highly unsatisfied by their services. States she went to pain management on June 8 and they did not refill her pain medication (Norco). Reports fracturing her hip in April for which she was hospitalized and then went to [facility] rehabilitation. Patient states the pain medicine provider will not refill her pain medication until they receive her discharge summary from [facility] rehabilitation. In addition, patient is concerned the pain management clinic is not accepting her insurance anymore. States she does not want to go back to them and is requesting [facility] to give her pain medications. States she is currently taking Norco 5-325 mg 2 tablets every 4 hours but does not like this medication. She is demanding "oxycodone 5 mg 2 pills every 4 hours. Patient appeared angry and upset. She was raising her voice during the encounter and continually referring to her pain management provider as "that evil witch."   Assessment Chronic pain. Patient appeared very comfortable on exam and displayed no signs of opiate withdrawal. Review of [place] controlled substance database reveals she was dispensed Norco 5-325 mg #84 tablets on 06/27/2016 and then dispensed oxycodone 5 mg #56 tablets on 08/05/2016. There is a note from June 2015 in the [facility] section stating patient violated her pain contract with another pain clinic. In addition, note from May 2012 in the [place] section mentions her having a history of accidental overdose.  Plan -Referral for new pain clinic has been placed -Explained to the patient that until she gets an appointment to a new pain clinic, she needs to continue to work with her current pain clinic [facility]) to get refills on her pain medications. Explained to her that due to the complexity of her  chronic pain syndrome and her history of accidental overdose in the past, [facility] does not have the expertise to manage her chronic pain with narcotics.

## 2016-08-22 NOTE — Progress Notes (Signed)
   CC: Patient is requesting pain medications and a referral to a new pain clinic.  HPI:  [name] is a 66 y.o. female with a past medical history of chronic pain syndrome and conditions listed below presenting to the clinic requesting pain medications and a referral to a new pain clinic. Please see problem based charting for the status of the patient's current and chronic medical conditions.   Past Medical History:  Diagnosis Date  . Anemia   . Anxiety   . Arthritis    "all in my body, primarily left hip" (11/11/2014)  . Bipolar disorder ([place])   . Chronic lower back pain   . Chronic pain syndrome    follows at [facility]  . COPD (chronic obstructive pulmonary disease) (HCC)    oxygen dependent (3L home continuous)  . DEFICIENCY, VITAMIN D NOS 01/03/2007  . Depression   . Duodenal ulcer, chronic   . Elevated liver function tests   . History of blood transfusion 03/2014; 10/2014   "low HgB" (11/11/2014)  . Hyperthyroidism    "borderline"  . On home oxygen therapy    "3L; 24/7" (8/30//2016)  . OSTEOPOROSIS 06/17/2009   DEXA 05/2009 : L femur -2.9; R femur -2.5. Alendronate on med list but not taking. Needs addressed ASAP as h/o fractures.    . Pneumonia X 1?  . Sciatic pain   . Shortness of breath    "all the time" (02/27/2013)  . Substance abuse     narcotics, alcohol, tobacco  . Suicidal ideation 2007    attempted overdose 2012  Dr. Tivis Ringer report    Review of Systems: Pertinent positives mentioned in HPI. Remainder of all ROS negative.   Physical Exam:  Vitals:   08/22/16 0942 08/22/16 1100  BP: (!) 158/71 133/65  Pulse: 98 75  Temp: 98.4 F (36.9 C)   TempSrc: Oral   SpO2: 100%   Height: 5\' 1"  (1.549 m)    Physical Exam  Constitutional: She is oriented to person, place, and time.  Sitting comfortably in a chair. Appears to be in no acute distress.  Eyes: Right eye exhibits no discharge. Left eye exhibits no discharge.  Cardiovascular: Normal  rate, regular rhythm and intact distal pulses.   Pulmonary/Chest: Effort normal and breath sounds normal. No respiratory distress. She has no wheezes. She has no rales.  Abdominal: Soft. Bowel sounds are normal. She exhibits no distension. There is no tenderness.  Musculoskeletal: She exhibits no edema.  Neurological: She is alert and oriented to person, place, and time.  Skin: Skin is warm and dry.  Psychiatric:  Appears angry and upset Raising her voice to speak     Assessment & Plan:   See Encounters Tab for problem based charting.  Patient discussed with Dr. Lynnae January

## 2016-08-22 NOTE — Patient Instructions (Addendum)
[name] it was nice meeting you today.  Our office will work toward scheduling you with another pain clinic. Meanwhile, I would encourage you to continue following up [place] clinic.

## 2016-08-23 NOTE — Progress Notes (Signed)
Internal Medicine Clinic Attending  Case discussed with Dr. Rathoreat the time of the visit. We reviewed the resident's history and exam and pertinent patient test results. I agree with the assessment, diagnosis, and plan of care documented in the resident's note.  

## 2016-08-23 NOTE — Telephone Encounter (Signed)
I completed them. Please let me know if you need anything else. Thank you.

## 2016-08-26 ENCOUNTER — Telehealth: Payer: Self-pay

## 2016-08-26 NOTE — Telephone Encounter (Signed)
Call from [place] and PT requesting to extend care 2x per week for 4 more weeks and will send home health aid 3 x per week verbal order given

## 2016-08-28 NOTE — Telephone Encounter (Signed)
I agree

## 2016-08-31 ENCOUNTER — Emergency Department (HOSPITAL_COMMUNITY): Payer: Medicare Other

## 2016-08-31 ENCOUNTER — Encounter (HOSPITAL_COMMUNITY): Payer: Self-pay | Admitting: General Practice

## 2016-08-31 ENCOUNTER — Telehealth: Payer: Self-pay | Admitting: *Deleted

## 2016-08-31 ENCOUNTER — Inpatient Hospital Stay (HOSPITAL_COMMUNITY)
Admission: EM | Admit: 2016-08-31 | Discharge: 2016-09-03 | DRG: 190 | Disposition: A | Discharge status: Home Health | Payer: Medicare Other | Attending: Internal Medicine | Admitting: Internal Medicine

## 2016-08-31 DIAGNOSIS — R634 Abnormal weight loss: Secondary | ICD-10-CM | POA: Diagnosis not present

## 2016-08-31 DIAGNOSIS — X58XXXA Exposure to other specified factors, initial encounter: Secondary | ICD-10-CM | POA: Diagnosis not present

## 2016-08-31 DIAGNOSIS — E43 Unspecified severe protein-calorie malnutrition: Secondary | ICD-10-CM | POA: Diagnosis present

## 2016-08-31 DIAGNOSIS — Z91048 Other nonmedicinal substance allergy status: Secondary | ICD-10-CM | POA: Diagnosis not present

## 2016-08-31 DIAGNOSIS — E0590 Thyrotoxicosis, unspecified without thyrotoxic crisis or storm: Secondary | ICD-10-CM | POA: Diagnosis present

## 2016-08-31 DIAGNOSIS — Z66 Do not resuscitate: Secondary | ICD-10-CM | POA: Diagnosis present

## 2016-08-31 DIAGNOSIS — F319 Bipolar disorder, unspecified: Secondary | ICD-10-CM | POA: Diagnosis present

## 2016-08-31 DIAGNOSIS — K219 Gastro-esophageal reflux disease without esophagitis: Secondary | ICD-10-CM | POA: Diagnosis present

## 2016-08-31 DIAGNOSIS — Z7951 Long term (current) use of inhaled steroids: Secondary | ICD-10-CM | POA: Diagnosis not present

## 2016-08-31 DIAGNOSIS — Z9981 Dependence on supplemental oxygen: Secondary | ICD-10-CM

## 2016-08-31 DIAGNOSIS — G894 Chronic pain syndrome: Secondary | ICD-10-CM | POA: Diagnosis present

## 2016-08-31 DIAGNOSIS — Z87310 Personal history of (healed) osteoporosis fracture: Secondary | ICD-10-CM | POA: Diagnosis not present

## 2016-08-31 DIAGNOSIS — F17210 Nicotine dependence, cigarettes, uncomplicated: Secondary | ICD-10-CM | POA: Diagnosis present

## 2016-08-31 DIAGNOSIS — F419 Anxiety disorder, unspecified: Secondary | ICD-10-CM | POA: Diagnosis present

## 2016-08-31 DIAGNOSIS — T7840XA Allergy, unspecified, initial encounter: Secondary | ICD-10-CM | POA: Diagnosis not present

## 2016-08-31 DIAGNOSIS — J439 Emphysema, unspecified: Secondary | ICD-10-CM | POA: Diagnosis not present

## 2016-08-31 DIAGNOSIS — J441 Chronic obstructive pulmonary disease with (acute) exacerbation: Secondary | ICD-10-CM | POA: Diagnosis present

## 2016-08-31 DIAGNOSIS — L89322 Pressure ulcer of left buttock, stage 2: Secondary | ICD-10-CM | POA: Diagnosis present

## 2016-08-31 DIAGNOSIS — J189 Pneumonia, unspecified organism: Secondary | ICD-10-CM | POA: Diagnosis present

## 2016-08-31 DIAGNOSIS — M1990 Unspecified osteoarthritis, unspecified site: Secondary | ICD-10-CM | POA: Diagnosis present

## 2016-08-31 DIAGNOSIS — M810 Age-related osteoporosis without current pathological fracture: Secondary | ICD-10-CM | POA: Diagnosis present

## 2016-08-31 DIAGNOSIS — Z888 Allergy status to other drugs, medicaments and biological substances status: Secondary | ICD-10-CM | POA: Diagnosis not present

## 2016-08-31 DIAGNOSIS — Z79899 Other long term (current) drug therapy: Secondary | ICD-10-CM | POA: Diagnosis not present

## 2016-08-31 DIAGNOSIS — E876 Hypokalemia: Secondary | ICD-10-CM | POA: Diagnosis present

## 2016-08-31 DIAGNOSIS — J440 Chronic obstructive pulmonary disease with acute lower respiratory infection: Secondary | ICD-10-CM | POA: Diagnosis present

## 2016-08-31 DIAGNOSIS — Z681 Body mass index (BMI) 19 or less, adult: Secondary | ICD-10-CM | POA: Diagnosis not present

## 2016-08-31 DIAGNOSIS — J9621 Acute and chronic respiratory failure with hypoxia: Secondary | ICD-10-CM | POA: Diagnosis present

## 2016-08-31 DIAGNOSIS — Z91018 Allergy to other foods: Secondary | ICD-10-CM | POA: Diagnosis not present

## 2016-08-31 HISTORY — DX: Pneumonia, unspecified organism: J18.9

## 2016-08-31 LAB — BASIC METABOLIC PANEL
Anion gap: 12 (ref 5–15)
BUN: <5 mg/dL — ABNORMAL LOW (ref 6–20)
CHLORIDE: 98 mmol/L — AB (ref 101–111)
CO2: 30 mmol/L (ref 22–32)
CREATININE: 0.35 mg/dL — AB (ref 0.44–1.00)
Calcium: 9.6 mg/dL (ref 8.9–10.3)
Glucose, Bld: 102 mg/dL — ABNORMAL HIGH (ref 65–99)
Potassium: 3.1 mmol/L — ABNORMAL LOW (ref 3.5–5.1)
SODIUM: 140 mmol/L (ref 135–145)

## 2016-08-31 LAB — CBC WITH DIFFERENTIAL/PLATELET
BASOS ABS: 0 10*3/uL (ref 0.0–0.1)
Basophils Relative: 0 %
EOS ABS: 0 10*3/uL (ref 0.0–0.7)
Eosinophils Relative: 0 %
HCT: 36.6 % (ref 36.0–46.0)
HEMOGLOBIN: 11.2 g/dL — AB (ref 12.0–15.0)
LYMPHS PCT: 6 %
Lymphs Abs: 0.6 10*3/uL — ABNORMAL LOW (ref 0.7–4.0)
MCH: 25.9 pg — ABNORMAL LOW (ref 26.0–34.0)
MCHC: 30.6 g/dL (ref 30.0–36.0)
MCV: 84.7 fL (ref 78.0–100.0)
Monocytes Absolute: 0.2 10*3/uL (ref 0.1–1.0)
Monocytes Relative: 2 %
NEUTROS PCT: 92 %
Neutro Abs: 9.6 10*3/uL — ABNORMAL HIGH (ref 1.7–7.7)
PLATELETS: INCREASED 10*3/uL (ref 150–400)
RBC: 4.32 MIL/uL (ref 3.87–5.11)
RDW: 14.9 % (ref 11.5–15.5)
SMEAR REVIEW: ADEQUATE
WBC: 10.4 10*3/uL (ref 4.0–10.5)

## 2016-08-31 LAB — I-STAT TROPONIN, ED: TROPONIN I, POC: 0.01 ng/mL (ref 0.00–0.08)

## 2016-08-31 MED ORDER — GUAIFENESIN-DM 100-10 MG/5ML PO SYRP: 5.0000 mL | ORAL_SOLUTION | ORAL | Status: DC | PRN

## 2016-08-31 MED ORDER — FLUTICASONE PROPIONATE 50 MCG/ACT NA SUSP
2.0000 | Freq: Every day | NASAL | Status: DC
  Administered 2016-09-01 – 2016-09-03 (×3): 2 via NASAL
  Filled 2016-08-31: qty 16

## 2016-08-31 MED ORDER — VANCOMYCIN HCL IN DEXTROSE 1-5 GM/200ML-% IV SOLN
1000.0000 mg | Freq: Once | INTRAVENOUS | Status: AC
  Administered 2016-08-31: 1000 mg via INTRAVENOUS
  Filled 2016-08-31: qty 200

## 2016-08-31 MED ORDER — DEXTROSE 5 % IV SOLN
1.0000 g | INTRAVENOUS | Status: DC
  Administered 2016-09-01 – 2016-09-02 (×2): 1 g via INTRAVENOUS
  Filled 2016-08-31 (×2): qty 10

## 2016-08-31 MED ORDER — DEXTROSE 5 % IV SOLN
2.0000 g | Freq: Once | INTRAVENOUS | Status: AC
  Administered 2016-08-31: 2 g via INTRAVENOUS
  Filled 2016-08-31: qty 2

## 2016-08-31 MED ORDER — SODIUM CHLORIDE 0.9 % IV SOLN: 250.0000 mL | INTRAVENOUS | Status: DC | PRN

## 2016-08-31 MED ORDER — MELATONIN 3 MG PO TABS
3.0000 mg | ORAL_TABLET | Freq: Every evening | ORAL | Status: DC | PRN
  Filled 2016-08-31: qty 1

## 2016-08-31 MED ORDER — SODIUM CHLORIDE 0.9% FLUSH
3.0000 mL | Freq: Two times a day (BID) | INTRAVENOUS | Status: DC
  Administered 2016-08-31 – 2016-09-02 (×4): 3 mL via INTRAVENOUS

## 2016-08-31 MED ORDER — LEVOCETIRIZINE DIHYDROCHLORIDE 5 MG PO TABS: 5.0000 mg | ORAL_TABLET | Freq: Every day | ORAL | Status: DC

## 2016-08-31 MED ORDER — ACETAMINOPHEN 325 MG PO TABS: 650.0000 mg | ORAL_TABLET | Freq: Four times a day (QID) | ORAL | Status: DC | PRN

## 2016-08-31 MED ORDER — ACETAMINOPHEN 650 MG RE SUPP: 650.0000 mg | Freq: Four times a day (QID) | RECTAL | Status: DC | PRN

## 2016-08-31 MED ORDER — PANTOPRAZOLE SODIUM 40 MG PO TBEC
40.0000 mg | DELAYED_RELEASE_TABLET | Freq: Two times a day (BID) | ORAL | Status: DC
  Administered 2016-09-01 – 2016-09-03 (×6): 40 mg via ORAL
  Filled 2016-08-31 (×6): qty 1

## 2016-08-31 MED ORDER — ALBUTEROL (5 MG/ML) CONTINUOUS INHALATION SOLN
10.0000 mg/h | INHALATION_SOLUTION | RESPIRATORY_TRACT | Status: AC
  Administered 2016-08-31: 10 mg/h via RESPIRATORY_TRACT
  Filled 2016-08-31: qty 20

## 2016-08-31 MED ORDER — LAMOTRIGINE 100 MG PO TABS
100.0000 mg | ORAL_TABLET | Freq: Every day | ORAL | Status: DC
  Administered 2016-09-01 – 2016-09-03 (×3): 100 mg via ORAL
  Filled 2016-08-31 (×3): qty 1

## 2016-08-31 MED ORDER — ALENDRONATE SODIUM 10 MG PO TABS: 10.0000 mg | ORAL_TABLET | Freq: Every day | ORAL | Status: DC

## 2016-08-31 MED ORDER — ALBUTEROL SULFATE (2.5 MG/3ML) 0.083% IN NEBU: 2.5000 mg | INHALATION_SOLUTION | RESPIRATORY_TRACT | Status: DC | PRN

## 2016-08-31 MED ORDER — SENNOSIDES-DOCUSATE SODIUM 8.6-50 MG PO TABS
1.0000 | ORAL_TABLET | Freq: Every evening | ORAL | Status: DC | PRN
  Filled 2016-08-31: qty 1

## 2016-08-31 MED ORDER — SODIUM CHLORIDE 0.9% FLUSH: 3.0000 mL | INTRAVENOUS | Status: DC | PRN

## 2016-08-31 MED ORDER — POTASSIUM CHLORIDE 20 MEQ PO PACK
40.0000 meq | PACK | Freq: Two times a day (BID) | ORAL | Status: AC
  Administered 2016-09-01 (×2): 40 meq via ORAL
  Filled 2016-08-31 (×3): qty 2

## 2016-08-31 MED ORDER — GABAPENTIN 300 MG PO CAPS
300.0000 mg | ORAL_CAPSULE | Freq: Every day | ORAL | Status: DC
  Administered 2016-09-01 – 2016-09-02 (×3): 300 mg via ORAL
  Filled 2016-08-31: qty 3
  Filled 2016-08-31 (×3): qty 1

## 2016-08-31 MED ORDER — SODIUM CHLORIDE 0.9 % IV SOLN
INTRAVENOUS | Status: AC
  Administered 2016-08-31: 21:00:00 via INTRAVENOUS

## 2016-08-31 MED ORDER — DEXTROSE 5 % IV SOLN
500.0000 mg | INTRAVENOUS | Status: DC
  Administered 2016-09-01: 500 mg via INTRAVENOUS
  Filled 2016-08-31 (×2): qty 500

## 2016-08-31 MED ORDER — OLANZAPINE 2.5 MG PO TABS
2.5000 mg | ORAL_TABLET | Freq: Every day | ORAL | Status: DC
  Administered 2016-09-01 – 2016-09-02 (×3): 2.5 mg via ORAL
  Filled 2016-08-31 (×6): qty 1

## 2016-08-31 MED ORDER — VITAMIN D 1000 UNITS PO TABS
1000.0000 [IU] | ORAL_TABLET | Freq: Every day | ORAL | Status: DC
  Administered 2016-09-01 – 2016-09-03 (×3): 1000 [IU] via ORAL
  Filled 2016-08-31 (×3): qty 1

## 2016-08-31 MED ORDER — IPRATROPIUM-ALBUTEROL 0.5-2.5 (3) MG/3ML IN SOLN
3.0000 mL | Freq: Four times a day (QID) | RESPIRATORY_TRACT | Status: DC
  Administered 2016-08-31 – 2016-09-03 (×11): 3 mL via RESPIRATORY_TRACT
  Filled 2016-08-31 (×11): qty 3

## 2016-08-31 MED ORDER — ENOXAPARIN SODIUM 40 MG/0.4ML ~~LOC~~ SOLN
40.0000 mg | SUBCUTANEOUS | Status: DC
  Administered 2016-09-01: 40 mg via SUBCUTANEOUS
  Filled 2016-08-31: qty 0.4

## 2016-08-31 MED ORDER — OXYCODONE-ACETAMINOPHEN 5-325 MG PO TABS
1.0000 | ORAL_TABLET | Freq: Three times a day (TID) | ORAL | Status: DC | PRN
  Administered 2016-08-31 – 2016-09-03 (×9): 1 via ORAL
  Filled 2016-08-31 (×9): qty 1

## 2016-08-31 MED ORDER — VENLAFAXINE HCL ER 37.5 MG PO CP24
37.5000 mg | ORAL_CAPSULE | Freq: Every evening | ORAL | Status: DC
  Administered 2016-08-31 – 2016-09-02 (×3): 37.5 mg via ORAL
  Filled 2016-08-31 (×4): qty 1

## 2016-08-31 MED ORDER — PREDNISONE 20 MG PO TABS
40.0000 mg | ORAL_TABLET | Freq: Every day | ORAL | Status: DC
  Administered 2016-09-01 – 2016-09-03 (×3): 40 mg via ORAL
  Filled 2016-08-31 (×3): qty 2

## 2016-08-31 MED ORDER — ENSURE ENLIVE PO LIQD: 237.0000 mL | Freq: Two times a day (BID) | ORAL | Status: DC

## 2016-08-31 NOTE — ED Notes (Signed)
Patient to xray.

## 2016-08-31 NOTE — ED Provider Notes (Signed)
I have personally seen and examined the patient. I have reviewed the documentation on PMH/FH/Soc Hx. I have discussed the plan of care with the resident and patient.  I have reviewed and agree with the resident's documentation. Please see associated encounter note.     Fatima Blank, MD 08/31/16 [phone]

## 2016-08-31 NOTE — ED Triage Notes (Signed)
Patient comes in per [facility] with c/o shortness of breath and productive greenish cough. Started 2 days ago. Sore throat and allergies noted. No chest pain. Patient wears 3 L O2 chronically. Hx of copd. Patient was 85% on 3L initially. 125 solumedrol and neb (5 mg albuterol and 0.5 atrovent) given. 20 LFA. 12 lead ekg SR. Ems v/s 164/90, 86 HR, 20 RR.

## 2016-08-31 NOTE — Progress Notes (Signed)
Pharmacy Antibiotic Note [name] is a 66 y.o. female admitted on hypoxia and sore throat. Starting broad spectrum abx for possible pneumonia. SCr low but, CrCl likely falsely elevated due to low muscle mass/size of patient. Dosed conservatively, recommend checking vancomycin trough at steady state.   Plan: -Vancomycin 1 g IV x1 then 750 mg IV q24h -Cefepime 2 g IV q24h -Monitor renal fx, cultures, VT at Css  Height: 5\' 1"  (154.9 cm) Weight: 90 lb (40.8 kg) IBW/kg (Calculated) : 47.8  Temp (24hrs), Avg:98.5 F (36.9 C), Min:98.5 F (36.9 C), Max:98.5 F (36.9 C) CrCl cannot be calculated (Patient's most recent lab result is older than the maximum 21 days allowed.).    Allergies  Allergen Reactions  . Banana Shortness Of Breath and Nausea And Vomiting  . Lyrica [Pregabalin] Other (See Comments)    "Disorients me"  . Pollen Extract Other (See Comments)    Seasonal     Antimicrobials this admission: 6/20 vancomycin > 6/20 cefepime >  Dose adjustments this admission: N/A  Microbiology results: 6/20 blood cx:   [name] 08/31/2016 3:16 PM

## 2016-08-31 NOTE — ED Notes (Addendum)
Patient on 5 L O2. Chronically on 3 L O2 and was sating 85%. Patient now 92% on 5L O2. Hx of copd.

## 2016-08-31 NOTE — Telephone Encounter (Signed)
Received call from [place], [facility]) stating that she is in patient's home.  Pt's O2 sats 85% with ambulation on. 3L O2 via [place], temp 99.2, pt weak, sore throat and voice is raspy.  Discussed with pt's pcp and given patients medical problems pt was advised to go to ED via EMS as pt does not have a ride today. Pt agreed.Despina Hidden Cassady6/20/20181:46 PM

## 2016-08-31 NOTE — ED Provider Notes (Signed)
[facility] DEPT Provider Note   CSN: [id number] Arrival date & time: 08/31/16  1416     History   Chief Complaint Chief Complaint  Patient presents with  . Shortness of Breath  . Cough    HPI [name] is a 66 y.o. female.  HPI  Patient is a 67 year old female with past medical history significant for COPD (3 L home O2), presents to the emergency department with a four-day history of worsening shortness of breath. Endorses a 2 day history of increased cough with green sputum production. Walking around worsens or shortness of breath. Nothing improves or shortness of breath. Has tried home nebulizers every 4 hours without improvement. Denies fever, chills, chest pain, nausea, vomiting, diaphoresis. Recent ORIF of the left hip following a fall with a 5 day hospitalization 06/10/16. No h/o DVT/PE. Denies LE edema or swelling.   Past Medical History:  Diagnosis Date  . Anemia   . Anxiety   . Arthritis    "all in my body, primarily left hip" (11/11/2014)  . Bipolar disorder ([place])   . Chronic lower back pain   . Chronic pain syndrome    follows at [facility]  . COPD (chronic obstructive pulmonary disease) (HCC)    oxygen dependent (3L home continuous)  . DEFICIENCY, VITAMIN D NOS 01/03/2007  . Depression   . Duodenal ulcer, chronic   . Elevated liver function tests   . History of blood transfusion 03/2014; 10/2014   "low HgB" (11/11/2014)  . Hyperthyroidism    "borderline"  . On home oxygen therapy    "3L; 24/7" (8/30//2016)  . OSTEOPOROSIS 06/17/2009   DEXA 05/2009 : L femur -2.9; R femur -2.5. Alendronate on med list but not taking. Needs addressed ASAP as h/o fractures.    . Pneumonia X 1?  . Sciatic pain   . Shortness of breath    "all the time" (02/27/2013)  . Substance abuse     narcotics, alcohol, tobacco  . Suicidal ideation 2007    attempted overdose 2012  Dr. Tivis Ringer report    Patient Active Problem List   Diagnosis Date Noted  . Pneumonia  08/31/2016  . Pressure ulcer 06/28/2016  . Right thyroid nodule 06/13/2016  . C3 cervical fracture ([place]) 06/11/2016  . Closed displaced intertrochanteric fracture of left femur ([place]) 06/09/2016  . Wrist drop, left 03/10/2016  . Constipation   . Orthostatic hypotension 11/11/2014  . Duodenal ulcer   . Polypharmacy 10/24/2014  . Smoking 10/24/2014  . Trochanteric bursitis of left hip 10/01/2014  . Rash and nonspecific skin eruption 09/17/2014  . Immunization, tetanus-diphtheria 09/05/2014  . Iron deficiency anemia 11/12/2013  . Health care maintenance 05/14/2013  . Nausea and vomiting 04/17/2013  . Insomnia 03/06/2013  . GERD (gastroesophageal reflux disease) 03/06/2013  . Protein-calorie malnutrition, severe ([place]) 03/04/2013  . Chronic pain 03/02/2013  . Dyslipidemia 02/26/2013  . Seasonal allergies 07/09/2012  . COPD (chronic obstructive pulmonary disease) with emphysema ([place]) 03/01/2011  . Osteoporosis 06/17/2009  . Bipolar I disorder, most recent episode depressed ([place]) 06/02/2009  . Vitamin D deficiency 01/03/2007  . Chronic back pain 12/07/2005    Past Surgical History:  Procedure Laterality Date  . ANKLE DEBRIDEMENT Left 10/2010  . AUGMENTATION MAMMAPLASTY  ~ 2007  . CESAREAN SECTION  1974; 1979  . ESOPHAGOGASTRODUODENOSCOPY N/A 11/07/2014   Procedure: ESOPHAGOGASTRODUODENOSCOPY (EGD);  Surgeon: Irene Shipper, MD;  Location[facility] ENDOSCOPY;  Service: Endoscopy;  Laterality: N/A;  . FRACTURE SURGERY    .  INTRAMEDULLARY (IM) NAIL INTERTROCHANTERIC Left 06/10/2016   Procedure: LEFT FEMUR INTRAMEDULLARY (IM) NAIL INTERTROCHANTRIC;  Surgeon: Leandrew Koyanagi, MD;  Location: [facility];  Service: Orthopedics;  Laterality: Left;  . ORIF ANKLE FRACTURE Left 10/2010  . TUBAL LIGATION  1979    OB History    No data available       Home Medications    Prior to Admission medications   Medication Sig Start Date End Date Taking? Authorizing Provider  albuterol (PROAIR HFA) 108 (90 Base)  MCG/ACT inhaler INHALE ONE PUFF BY MOUTH EVERY 2 TO 4 HOURS AS NEEDED FOR WHEEZING 08/16/16  Yes Rice, Resa Miner, MD  alendronate (FOSAMAX) 10 MG tablet Take 1 tablet (10 mg total) by mouth daily before breakfast. Take with a full glass of water on an empty stomach. 06/13/16  Yes Asencion Partridge, MD  Fluticasone-Salmeterol (ADVAIR DISKUS) 250-50 MCG/DOSE AEPB INHALE 1 PUFF INTO THE LUNGS 2 TIMES DAILY 03/19/14  Yes Jones Bales, MD  gabapentin (NEURONTIN) 300 MG capsule Take 300 mg by mouth at bedtime.  06/25/14  Yes [provider]  HYDROcodone-acetaminophen (NORCO/VICODIN) 5-325 MG tablet Take 1-2 tablets by mouth every 4 (four) hours as needed for moderate pain. 06/13/16  Yes Asencion Partridge, MD  lamoTRIgine (LAMICTAL) 100 MG tablet TAKE ONE TABLET EVERY DAY 09/23/14  Yes Sid Falcon, MD  levocetirizine (XYZAL) 5 MG tablet Take 5 mg by mouth at bedtime.    Yes [provider]  OLANZapine (ZYPREXA) 2.5 MG tablet Take 2.5 mg by mouth at bedtime.   Yes [provider]  oxyCODONE (OXY IR/ROXICODONE) 5 MG immediate release tablet Take 5-10 mg by mouth See admin instructions. Take 1 tablet for moderate pain or 2 tablets for severe pain every 4 hours for post surgical pain.   Yes [provider]  Cholecalciferol 1000 units TBDP Take 1,000 mg by mouth daily.    [provider]  enoxaparin (LOVENOX) 40 MG/0.4ML injection Inject 0.4 mLs (40 mg total) into the skin daily. Until 06/20/2016 06/13/16 06/21/16  Asencion Partridge, MD  feeding supplement, ENSURE ENLIVE, (ENSURE ENLIVE) LIQD Take 237 mLs by mouth 2 (two) times daily between meals. Patient not taking: Reported on 06/29/2016 06/13/16   Asencion Partridge, MD  fluticasone [facility]) 50 MCG/ACT nasal spray Place 2 sprays into both nostrils daily.    [provider]  guaiFENesin-dextromethorphan (ROBITUSSIN DM) 100-10 MG/5ML syrup Take 5 mLs by mouth every 4 (four) hours as needed for cough. Patient not taking: Reported  on 06/29/2016 02/04/15   Riccardo Dubin, MD  ipratropium-albuterol (DUONEB) 0.5-2.5 (3) MG/3ML SOLN 4 times a day (breakfast, lunch, dinner, bedtime).Can take 2 additional treatments if needed on bad days. DX J43.8 Patient taking differently: Take 3 mLs by nebulization 4 (four) times daily. (breakfast, lunch, dinner, bedtime).Can take 2 additional treatments if needed on bad days. DX J43.8 06/27/16   Maryellen Pile, MD  Melatonin 10 MG TABS Take 1 tablet by mouth at bedtime as needed. Patient taking differently: Take 1 tablet by mouth at bedtime.  05/26/15   Bartholomew Crews, MD  ondansetron (ZOFRAN) 4 MG tablet Take 1 tablet (4 mg total) by mouth every 8 (eight) hours as needed for nausea or vomiting. 06/27/16   Maryellen Pile, MD  OXYGEN Inhale 3 L into the lungs continuous.    [provider]  pantoprazole (PROTONIX) 40 MG tablet Take 1 tablet (40 mg total) by mouth 2 (two) times daily. 11/09/14   Burgess Estelle, MD  polyethylene glycol powder (GLYCOLAX/MIRALAX) powder TAKE 17G DISSOLVED IN 8OZ WATER DAILY ASNEEDED FOR MODERATE CONSTIPATION 09/23/14   Sid Falcon, MD  senna (SENOKOT) 8.6 MG TABS tablet Take 2 tablets (17.2 mg total) by mouth 3 (three) times daily as needed for mild constipation. 11/13/14   Jule Ser, DO  Spacer/Aero-Holding Chambers (AEROCHAMBER Z-STAT PLUS/MEDIUM) inhaler Use with rescue inhaler 05/08/15   Zada Finders, MD  venlafaxine XR (EFFEXOR-XR) 37.5 MG 24 hr capsule Take 37.5 mg by mouth every evening. 10/24/15   [provider]  white petrolatum (VASELINE) GEL Apply 1 application topically daily. To both feet for dry skin    [provider]    Family History Family History  Problem Relation Age of Onset  . Myelodysplastic syndrome Father        Died from myelofibrosis though diagnosed post-mortem  . Stroke Neg Hx   . Cancer Neg Hx     Social History Social History  Substance Use Topics  . Smoking status: Current Every Day  Smoker    Packs/day: 0.50    Years: 45.00    Types: Cigarettes  . Smokeless tobacco: Never Used     Comment: 11/11/2014 ""smoking 1 cigarettes/day; that's down from 2 ppd"  . Alcohol use 0.0 oz/week     Comment: 11/11/2014 "nothing in 7 years; never had a problem w/it"     Allergies   Banana; Lyrica [pregabalin]; and Pollen extract   Review of Systems Review of Systems  Constitutional: Negative for appetite change and fever.  HENT: Negative for congestion.   Respiratory: Positive for cough, shortness of breath and wheezing. Negative for chest tightness.   Cardiovascular: Negative for chest pain and leg swelling.  Gastrointestinal: Negative for diarrhea, nausea and vomiting.  Genitourinary: Negative for dysuria.  Musculoskeletal: Negative for back pain.  Skin: Negative for rash.  Neurological: Negative for syncope, weakness and light-headedness.  Psychiatric/Behavioral: Negative for behavioral problems.     Physical Exam Updated Vital Signs BP 137/75   Pulse 93   Temp 98.5 F (36.9 C) (Oral)   Resp 13   Ht 5\' 1"  (1.549 m)   Wt 40.8 kg (90 lb)   SpO2 93%   BMI 17.01 kg/m   Physical Exam  Constitutional: She is oriented to person, place, and time.  Cachectic  HENT:  Head: Atraumatic.  Eyes: Conjunctivae and EOM are normal. Pupils are equal, round, and reactive to light.  Neck: Normal range of motion. No JVD present. No tracheal deviation present.  Cardiovascular: Normal rate, regular rhythm, normal heart sounds and intact distal pulses.   No murmur heard. Pulmonary/Chest: She has wheezes ( End expiratory wheezing diffusely).  3 L nasal cannula. No tachypnea. Prolonged expiratory phase. Subcostal retractions.   Abdominal: She exhibits no distension. There is no tenderness.  Musculoskeletal: Normal range of motion. She exhibits no edema.  No unilateral leg swelling  Neurological: She is alert and oriented to person, place, and time.  Skin: Skin is warm. Capillary  refill takes less than 2 seconds. No pallor.  Psychiatric: She has a normal mood and affect.     ED Treatments / Results  Labs (all labs ordered are listed, but only abnormal results are displayed) Labs Reviewed  CBC WITH DIFFERENTIAL/PLATELET - Abnormal; Notable for the following:       Result Value   Hemoglobin 11.2 (*)    MCH 25.9 (*)    Neutro Abs 9.6 (*)    Lymphs Abs 0.6 (*)    All  other components within normal limits  BASIC METABOLIC PANEL - Abnormal; Notable for the following:    Potassium 3.1 (*)    Chloride 98 (*)    Glucose, Bld 102 (*)    BUN <5 (*)    Creatinine, Ser 0.35 (*)    All other components within normal limits  CULTURE, BLOOD (ROUTINE X 2)  CULTURE, BLOOD (ROUTINE X 2)  HIV ANTIBODY (ROUTINE TESTING)  BASIC METABOLIC PANEL  CBC  I-STAT TROPOININ, ED    EKG  EKG Interpretation None       Radiology Dg Chest 2 View  Result Date: 08/31/2016 CLINICAL DATA:  Shortness of breath, weakness, COPD EXAM: CHEST  2 VIEW COMPARISON:  06/09/2016 FINDINGS: There is hyperinflation of the lungs compatible with COPD. Bullous changes in the apices. Increasing airspace disease in the left lower lung, likely within the lingula concerning for pneumonia. Heart is normal size. No effusions or acute bony abnormality. IMPRESSION: Advanced bullous emphysema. Increasing lingular opacity concerning for pneumonia. Electronically Signed   By: Rolm Baptise M.D.   On: 08/31/2016 14:53    Procedures Procedures (including critical care time)  Medications Ordered in ED Medications  albuterol (PROVENTIL,VENTOLIN) solution continuous neb (0 mg/hr Nebulization Stopped 08/31/16 1650)  enoxaparin (LOVENOX) injection 40 mg (not administered)  sodium chloride flush (NS) 0.9 % injection 3 mL (not administered)  sodium chloride flush (NS) 0.9 % injection 3 mL (not administered)  0.9 %  sodium chloride infusion (not administered)  acetaminophen (TYLENOL) tablet 650 mg (not administered)     Or  acetaminophen (TYLENOL) suppository 650 mg (not administered)  senna-docusate (Senokot-S) tablet 1 tablet (not administered)  0.9 %  sodium chloride infusion (not administered)  potassium chloride (KLOR-CON) packet 40 mEq (not administered)  ipratropium-albuterol (DUONEB) 0.5-2.5 (3) MG/3ML nebulizer solution 3 mL (not administered)  ceFEPIme (MAXIPIME) 2 g in dextrose 5 % 50 mL IVPB (0 g Intravenous Stopped 08/31/16 1650)  vancomycin (VANCOCIN) IVPB 1000 mg/200 mL premix (1,000 mg Intravenous New Bag/Given 08/31/16 1650)     Initial Impression / Assessment and Plan / ED Course  I have reviewed the triage vital signs and the nursing notes.  Pertinent labs & imaging results that were available during my care of the patient were reviewed by me and considered in my medical decision making (see chart for details).     66 year old female with past medical history significant for COPD (chronic 3 L nasal cannula), who presents to the emergency department with dyspnea and sputum production. No fever. With EMS received DuoNeb and IV Solu-Medrol. Mild respiratory distress with expiratory wheezing.  Chest x-ray concerning for left lingular pneumonia. Patient most likely with a COPD exacerbation secondary to PNA. HCAP given recent hospitalization. Blood cultures obtained, started on broad-spectrum antibiotics. Given continuous albuterol. Doubt PE, low risk Well's criteria, ambulating since surgery.   Pt admitted to Internal medicine, stable for the floor.   Final Clinical Impressions(s) / ED Diagnoses   Final diagnoses:  COPD exacerbation ([place])  HCAP (healthcare-associated pneumonia)    New Prescriptions New Prescriptions   No medications on file     Nathaniel Man, MD 08/31/16 1755

## 2016-08-31 NOTE — H&P (Signed)
Date: 08/31/2016               Patient Name:  [name] MRN: [id number]  DOB: 1951-01-13 Age / Sex: 66 y.o., female   PCP: Riccardo Dubin, MD         Medical Service: Internal Medicine Teaching Service         Attending Physician: Dr. Lucious Groves, DO    First Contact: Loleta Books, MS4 Pager: [phone]  Second Contact: Dr. Jule Ser Pager: [phone]       After Hours (After 5p/  First Contact Pager: [phone]  weekends / holidays): Second Contact Pager: [phone]   Chief Complaint: trouble breathing  History of Present Illness: [name] is a 66 y.o. lady with a PMHx of COPD on continuous 3L nasal cannula, chronic pain syndrome, bipolar disorder, depression, osteoporosis who just completed rehab s/p left hip fracture and surgery in March 2018 who presents with 3-4 days of dyspnea with a productive dark greenish cough for 3-4 days. Home nurse noted O2 sats 85% with ambulation on 3L oxygen and PCP advised her to call EMS. Pt has been feeling short of breath with activity and at rest on home dose oxygen. Also endorses wheezing, orthopnea and nocturnal dyspnea. Throat has been sore during this time leading to decreased PO intake. Endorses a subjective fever and headache. Tried home Advair and rescue Proair without relief. Unintentional weight loss in the last few months, unsure how much. Per chart review, was 92 pounds in Dec 2017, then 98 pounds in April 2018.  Now weighs 90 pounds. Denies chest pain, palpitations, decreased appetite, abdominal pain, n/v/d/c, urinary symptoms, LE edema or sick contacts. No recent travel. Endorses relief from breathing treatment and steroids given in ED.  Per patient, last COPD exacerbation was last year (Feb 2017 in chart) and required antibiotic treatment. She has smoked 2-3 cigarettes/day for last ~ 3 years, before had been smoking 2 packs/day. She takes "oxycodone" Hydrocodone-acetaminophen 5-325 mg Q4H daily for sciatic pain.   On  presentation to ED patient sat 85% on 3L. WBC 10.4, afebrile. CXR shower hyperinflated lungs compatible with COPD and increasing lingular opacity concerning for pneumonia. IV Solumedrol and nebulizer treatment given. Received single dose of Vancomycin and Cefepime. Blood CX X2 sent.  Meds:  Current Meds  Medication Sig  . albuterol (PROAIR HFA) 108 (90 Base) MCG/ACT inhaler INHALE ONE PUFF BY MOUTH EVERY 2 TO 4 HOURS AS NEEDED FOR WHEEZING  . alendronate (FOSAMAX) 10 MG tablet Take 1 tablet (10 mg total) by mouth daily before breakfast. Take with a full glass of water on an empty stomach.  . Fluticasone-Salmeterol (ADVAIR DISKUS) 250-50 MCG/DOSE AEPB INHALE 1 PUFF INTO THE LUNGS 2 TIMES DAILY  . gabapentin (NEURONTIN) 300 MG capsule Take 300 mg by mouth at bedtime.   Marland Kitchen HYDROcodone-acetaminophen (NORCO/VICODIN) 5-325 MG tablet Take 1-2 tablets by mouth every 4 (four) hours as needed for moderate pain.     Allergies: Allergies as of 08/31/2016 - Review Complete 08/31/2016  Allergen Reaction Noted  . Banana Shortness Of Breath and Nausea And Vomiting 01/10/2012  . Lyrica [pregabalin] Other (See Comments) 06/26/2014  . Pollen extract Other (See Comments) 11/20/2012   Past Medical History:  Diagnosis Date  . Anemia   . Anxiety   . Arthritis    "all in my body, primarily left hip" (11/11/2014)  . Bipolar disorder ([place])   . Chronic lower back pain   . Chronic pain syndrome  follows at [facility]  . COPD (chronic obstructive pulmonary disease) (HCC)    oxygen dependent (3L home continuous)  . DEFICIENCY, VITAMIN D NOS 01/03/2007  . Depression   . Duodenal ulcer, chronic   . Elevated liver function tests   . History of blood transfusion 03/2014; 10/2014   "low HgB" (11/11/2014)  . Hyperthyroidism    "borderline"  . On home oxygen therapy    "3L; 24/7" (8/30//2016)  . OSTEOPOROSIS 06/17/2009   DEXA 05/2009 : L femur -2.9; R femur -2.5. Alendronate on med list but not taking.  Needs addressed ASAP as h/o fractures.    . Pneumonia X 1?  . Sciatic pain   . Shortness of breath    "all the time" (02/27/2013)  . Substance abuse     narcotics, alcohol, tobacco  . Suicidal ideation 2007    attempted overdose 2012  Dr. Tivis Ringer report    Family History:  - Father died from myelofibrosis  Social History:  - Patient lives alone in an apartment in [place]. 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 She has smoked 2-3 cigarettes for last ~3 years, had smoke 2 packs/day for >40 years. No alcohol or illicit drug use.  Review of Systems: Constitutional: Endorses fevers.  HENT: Throat pain, no nasal discharge. Eyes: Negative.  Respiratory: Endorses cough, greenish sputum production, shortness of breath and wheezing. Negative for hemoptysis.   Cardiovascular: Orthopnea. Negative for chest pain, palpitations, and leg swelling.  Gastrointestinal: Negative for nausea, vomiting, abdominal pain, diarrhea and constipation.   Genitourinary: Negative for dysuria, poluria.  Musculoskeletal: Recent fall off the couch a few days ago. Negative for myalgias.  Skin: Negative for rash.  Neurological: Endorses headache. Negative for dizziness, loss of consciousness.  Psychiatric/Behavioral: Negative for substance abuse.   Physical Exam: Blood pressure 137/75, pulse 93, temperature 98.5 F (36.9 C), temperature source Oral, resp. rate 13, height 5\' 1"  (1.549 m), weight 40.8 kg (90 lb), SpO2 93 %.  Constitutional: Thin, elderly woman, sleepy, raspy voice, appears older than actual age   Head: Normocephalic and atraumatic  Mouth/Throat: Oropharynx is clear and moist Eyes: EOM are normal. PERRL, non-icteric Neck: Normal range of motion. No tracheal deviation present.  Cardiovascular: Normal rate, regular rhythm and intact distal pulses. No murmur/rub/gallop Respiratory: No chest tenderness. Normal WOB. Distant breath sounds with inspiratory and end-expiratory  wheezing diffusely Abdominal: Soft. Bowel sounds are normal. No distension. No tenderness, guarding or rebound Musculoskeletal: Decreased strength grossly. No edema or tenderness. Thin extremities  Neurological: Alert and oriented. CN intact, gait deferred  Skin: Skin is warm and dry. Poor skin turgor Psychiatric: She has a normal mood and affect  Lab Results: CBC with Differential (Abnormal) Collected: 08/31/16 1541  Specimen: Blood Updated: 08/31/16 1649   WBC 10.4 K/uL    RBC 4.32 MIL/uL    Hemoglobin 11.2 (L) g/dL    HCT 36.6 %    MCV 84.7 fL    MCH 25.9 (L) pg    MCHC 30.6 g/dL    RDW 14.9 %    Platelets     PLATELETS APPEAR INCREASED   K/uL   Neutrophils Relative % 92 %    Lymphocytes Relative 6 %    Monocytes Relative 2 %    Eosinophils Relative 0 %    Basophils Relative 0 %    Neutro Abs 9.6 (H) K/uL    Lymphs Abs 0.6 (L) K/uL    Monocytes Absolute 0.2 K/uL  Eosinophils Absolute 0.0 K/uL    Basophils Absolute 0.0 K/uL    Smear Review PLATELET CLUMPS NOTED ON SMEAR, COUNT APPEARS ADEQUATE  Basic metabolic panel (Abnormal) Collected: 08/31/16 1541  Specimen: Blood Updated: 08/31/16 1638   Sodium 140 mmol/L    Potassium 3.1 (L) mmol/L    Chloride 98 (L) mmol/L    CO2 30 mmol/L    Glucose, Bld 102 (H) mg/dL    BUN <5 (L) mg/dL    Creatinine, Ser 0.35 (L) mg/dL    Calcium 9.6 mg/dL    GFR calc non Af Amer >60 mL/min    GFR calc Af Amer >60 mL/min    Anion gap 12  I-Stat Troponin, ED (not at [facility]) Collected: 08/31/16 1605  Specimen: Blood Updated: 08/31/16 1616   Troponin i, poc 0.01 ng/mL    Comment 3       EKG:  Vent. rate 82 BPM PR interval * ms QRS duration 69 ms QT/QTc 406/475 ms P-R-T axes 85 80 -89 Sinus rhythm Ventricular premature complex Anteroseptal infarct, age indeterminate  CXR:  FINDINGS: There is hyperinflation of the lungs compatible with COPD. Bullous changes in the apices. Increasing airspace disease in the left lower lung,  likely within the lingula concerning for pneumonia. Heart is normal size. No effusions or acute bony abnormality.  IMPRESSION: Advanced bullous emphysema.  Increasing lingular opacity concerning for pneumonia.  - PFTs in June 2014 > FEV1 28% pred, FEV1/FVC 47   Assessment & Plan by Problem: Active Problems:   Pneumonia  [name] is a 66 y.o. lady with a PMHx of COPD on continuous 3L nasal cannula, chronic pain syndrome, bipolar disorder who just completed rehab s/p left hip fracture and surgery in March 2018 who presents with 3-4 days of dyspnea and productive cough.  #Acute COPD exacerbation: ## Pneumonia  Patient has hx of COPD with acute worsening of breathing status, wheezing, a productive cough and decreased oxygen saturation on home dose 3L O2. CXR shows hyperinflation of lungs consistent with COPD and increased lingular opacity concerning for pneumonia. Without leukocytosis and afebrile. Endorses improvement with breathing steroid and duoneb treatment. Acute COPD exacerbation 2/2 pneumonia likely vs. PE without recent travel, unilateral leg pain or swelling, low Well's score. - Discontinue Vancomycin and Cefepime - Start IV Azithromycin 500 mg daily, IV Ceftriaxone 1 g daily for CAP - Continue Ipatropium-albuterol 3 mL Q6H; Albuterol PRN - Start Prednisone 40 mg daily X4 days - O2 nasal cannula; keep O2 sats 88-92% - Guaifensin-dextro 5 mL Q4H PRN for cough - AM CBC - IV NS 75 mL/hr for 12 hours - Continuous pulse oximetry - Give Acetaminophen 650 mg PO or rectal PRN for pain or fever  #Hypokalemia: 3.1 on presentation. Likely due to decreased PO intake. - Start K 40 mEq PO X2 - Encourage PO intake - AM BMP  #Chronic pain:  - Continue home dose Oxycodone-acetaminophen 5-325 mg Q8H PRN for severe pain - Continue home dose Gabapentin 300 mg at bedtime - Senna-docusate 1 tablet PRN for constipation  #Bipolar disorder: - Continue Lamotrigine 100 mg daily - Olanzapine  2.5 mg at bedtime  #Allergies: - Continue home dose Levocetirizine 5 mg PO daily - Give Fluticasone 2 sprays daily  #Osteoporosis: - Continue home dose Alendronate 10 mg before breakfast - Continue home dose Cholecalciferol 1,000 mg daily  #Depression: - Continue Venlafaxine 37.5 mg every evening  #Reflux: - Give Pantoprazole 40 mg BID  #Tobacco use: Cut back to 2-3 cigarettes/day for past  3 years. Commended. - Encourage cessation  #FEN: Regular diet  #VTE prophylaxis: Enoxaparin 40 mg SQ daily  #Code: DNR/DNI  Dispo: Admit patient to Observation with expected length of stay less than 2 midnights.  Signed: Loleta Books, Medical Student 08/31/2016, 5:24 PM  Pager: [phone]  Attestation for Student Documentation:  I personally was present and performed or re-performed the history, physical exam and medical decision-making activities of this service and have verified that the service and findings are accurately documented in the student's note.  Jule Ser, DO 08/31/2016, 7:58 PM

## 2016-08-31 NOTE — ED Notes (Signed)
Admitting at bedside 

## 2016-09-01 DIAGNOSIS — F319 Bipolar disorder, unspecified: Secondary | ICD-10-CM

## 2016-09-01 DIAGNOSIS — G894 Chronic pain syndrome: Secondary | ICD-10-CM

## 2016-09-01 DIAGNOSIS — K219 Gastro-esophageal reflux disease without esophagitis: Secondary | ICD-10-CM

## 2016-09-01 DIAGNOSIS — Z7951 Long term (current) use of inhaled steroids: Secondary | ICD-10-CM

## 2016-09-01 DIAGNOSIS — Z91018 Allergy to other foods: Secondary | ICD-10-CM

## 2016-09-01 DIAGNOSIS — R634 Abnormal weight loss: Secondary | ICD-10-CM

## 2016-09-01 DIAGNOSIS — Z9981 Dependence on supplemental oxygen: Secondary | ICD-10-CM

## 2016-09-01 DIAGNOSIS — X58XXXA Exposure to other specified factors, initial encounter: Secondary | ICD-10-CM

## 2016-09-01 DIAGNOSIS — M810 Age-related osteoporosis without current pathological fracture: Secondary | ICD-10-CM

## 2016-09-01 DIAGNOSIS — E876 Hypokalemia: Secondary | ICD-10-CM

## 2016-09-01 DIAGNOSIS — Z87310 Personal history of (healed) osteoporosis fracture: Secondary | ICD-10-CM

## 2016-09-01 DIAGNOSIS — Z91048 Other nonmedicinal substance allergy status: Secondary | ICD-10-CM

## 2016-09-01 DIAGNOSIS — T7840XA Allergy, unspecified, initial encounter: Secondary | ICD-10-CM

## 2016-09-01 DIAGNOSIS — J441 Chronic obstructive pulmonary disease with (acute) exacerbation: Principal | ICD-10-CM

## 2016-09-01 DIAGNOSIS — Z832 Family history of diseases of the blood and blood-forming organs and certain disorders involving the immune mechanism: Secondary | ICD-10-CM

## 2016-09-01 DIAGNOSIS — Z9181 History of falling: Secondary | ICD-10-CM

## 2016-09-01 DIAGNOSIS — Z79899 Other long term (current) drug therapy: Secondary | ICD-10-CM

## 2016-09-01 DIAGNOSIS — Z66 Do not resuscitate: Secondary | ICD-10-CM

## 2016-09-01 DIAGNOSIS — J9621 Acute and chronic respiratory failure with hypoxia: Secondary | ICD-10-CM

## 2016-09-01 DIAGNOSIS — F17210 Nicotine dependence, cigarettes, uncomplicated: Secondary | ICD-10-CM

## 2016-09-01 DIAGNOSIS — Z79891 Long term (current) use of opiate analgesic: Secondary | ICD-10-CM

## 2016-09-01 DIAGNOSIS — Z888 Allergy status to other drugs, medicaments and biological substances status: Secondary | ICD-10-CM

## 2016-09-01 LAB — CBC
HCT: 29.8 % — ABNORMAL LOW (ref 36.0–46.0)
Hemoglobin: 8.9 g/dL — ABNORMAL LOW (ref 12.0–15.0)
MCH: 25.7 pg — AB (ref 26.0–34.0)
MCHC: 29.9 g/dL — AB (ref 30.0–36.0)
MCV: 86.1 fL (ref 78.0–100.0)
PLATELETS: 591 10*3/uL — AB (ref 150–400)
RBC: 3.46 MIL/uL — ABNORMAL LOW (ref 3.87–5.11)
RDW: 14.9 % (ref 11.5–15.5)
WBC: 7.1 10*3/uL (ref 4.0–10.5)

## 2016-09-01 LAB — HIV ANTIBODY (ROUTINE TESTING W REFLEX): HIV Screen 4th Generation wRfx: NONREACTIVE

## 2016-09-01 LAB — BASIC METABOLIC PANEL
Anion gap: 9 (ref 5–15)
BUN: 6 mg/dL (ref 6–20)
CALCIUM: 8.7 mg/dL — AB (ref 8.9–10.3)
CO2: 33 mmol/L — AB (ref 22–32)
CREATININE: 0.34 mg/dL — AB (ref 0.44–1.00)
Chloride: 98 mmol/L — ABNORMAL LOW (ref 101–111)
GFR calc Af Amer: >60 mL/min (ref >60)
GFR calc non Af Amer: >60 mL/min (ref >60)
GLUCOSE: 207 mg/dL — AB (ref 65–99)
Potassium: 3.2 mmol/L — ABNORMAL LOW (ref 3.5–5.1)
Sodium: 140 mmol/L (ref 135–145)

## 2016-09-01 MED ORDER — ADULT MULTIVITAMIN LIQUID CH
15.0000 mL | Freq: Every day | ORAL | Status: DC
  Administered 2016-09-01 – 2016-09-03 (×3): 15 mL via ORAL
  Filled 2016-09-01 (×3): qty 15

## 2016-09-01 MED ORDER — BOOST / RESOURCE BREEZE PO LIQD: 1.0000 | Freq: Three times a day (TID) | ORAL | Status: DC

## 2016-09-01 MED ORDER — ADULT MULTIVITAMIN W/MINERALS CH
1.0000 | ORAL_TABLET | Freq: Every day | ORAL | Status: DC
  Filled 2016-09-01: qty 1

## 2016-09-01 MED ORDER — ENOXAPARIN SODIUM 30 MG/0.3ML ~~LOC~~ SOLN
30.0000 mg | SUBCUTANEOUS | Status: DC
  Administered 2016-09-02 – 2016-09-03 (×2): 30 mg via SUBCUTANEOUS
  Filled 2016-09-01 (×2): qty 0.3

## 2016-09-01 MED ORDER — PHENOL 1.4 % MT LIQD: 1.0000 | OROMUCOSAL | Status: DC | PRN

## 2016-09-01 MED ORDER — MENTHOL 3 MG MT LOZG
1.0000 | LOZENGE | OROMUCOSAL | Status: DC | PRN
  Administered 2016-09-01: 3 mg via ORAL
  Filled 2016-09-01: qty 9

## 2016-09-01 MED ORDER — AZITHROMYCIN 500 MG PO TABS
500.0000 mg | ORAL_TABLET | Freq: Every day | ORAL | Status: DC
  Administered 2016-09-02 – 2016-09-03 (×2): 500 mg via ORAL
  Filled 2016-09-01 (×2): qty 1

## 2016-09-01 NOTE — Progress Notes (Signed)
IV infiltrated. Attempted to start x 2 without success. IV team started in upper part of right forearm.

## 2016-09-01 NOTE — Progress Notes (Signed)
Subjective: NAEO. Breathing improved but still feels "crummy." Eating < 50% of meals because of sore throat. No chest pain, palpitations or fevers. Last BM yesterday. Would really like to see her PCP, Dr. Posey Pronto.   Objective:  Vital signs in last 24 hours: Vitals:   08/31/16 2321 09/01/16 0300 09/01/16 0631 09/01/16 0940  BP: (!) 160/79  123/65   Pulse: 80 70 64 69  Resp: 18 18 17 18   Temp: 98.3 F (36.8 C)     TempSrc: Axillary     SpO2: 90% 90% 99% 98%  Weight:      Height:       Weight change:   Intake/Output Summary (Last 24 hours) at 09/01/16 1111 Last data filed at 09/01/16 1001  Gross per 24 hour  Intake             1381 ml  Output              250 ml  Net             1131 ml   General appearance: alert sitting in chair, cooperative, appears older than stated age, looks fatigued Lungs: normal WOB, diminished breath sounds, intermittent end-expiratory wheezing Heart: regular rate and rhythm, S1, S2 normal, no murmur, click, rub or gallop Abdomen: soft, non-tender; bowel sounds normal; no masses,  no organomegaly Extremities: thin extremities, atraumatic, no cyanosis or edema Pulses: 2+ and symmetric Skin: Skin color, texture normal, turgor decreased. No rashes or lesions  Lab Results: Basic metabolic panel (Abnormal) Collected: 09/01/16 0600  Specimen: Blood Updated: 09/01/16 0819   Sodium 140 mmol/L    Potassium 3.2 (L) mmol/L    Chloride 98 (L) mmol/L    CO2 33 (H) mmol/L    Glucose, Bld 207 (H) mg/dL    BUN 6 mg/dL    Creatinine, Ser 0.34 (L) mg/dL    Calcium 8.7 (L) mg/dL    GFR calc non Af Amer >60 mL/min    GFR calc Af Amer >60 mL/min    Anion gap 9  CBC (Abnormal) Collected: 09/01/16 0600  Specimen: Blood Updated: 09/01/16 0812   WBC 7.1 K/uL    RBC 3.46 (L) MIL/uL    Hemoglobin 8.9 (L) g/dL    HCT 29.8 (L) %    MCV 86.1 fL    MCH 25.7 (L) pg    MCHC 29.9 (L) g/dL    RDW 14.9 %    Platelets 591 (H) K/uL     Assessment/Plan:  Active  Problems:   Pneumonia  [name] is a 66 y.o. lady with a PMHx of COPD on continuous 3L nasal cannula, chronic pain syndrome, bipolar disorder who just completed rehab s/p left hip fracture and surgery in March 2018 here with an acute COPD exacerbation 2/2 PNA.  #Acute COPD exacerbation: ## Possible Left lingular Pneumonia  Breathing improved this morning with improved saturation. Hx of COPD with acute worsening of breathing status, wheezing, a productive cough, decreased oxygen saturation on home dose 3L O2 and CXR findings consistent with acute COPD exacerbation 2/2 pneumonia vs. PE without recent travel, unilateral leg pain or swelling, low Well's score. - Continue IV Azithromycin 500 mg daily, IV Ceftriaxone 1 g daily for CAP - Continue Ipatropium-albuterol 3 mL Q6H; Albuterol PRN - Continue Prednisone 40 mg daily X4 days - O2 nasal cannula; keep O2 sats 88-92% - Guaifensin-dextro 5 mL Q4H PRN for cough - Cepacol lozenge 3 mg PRN, Phenol mouth spray PRN for throat pain -  AM CBC - Continuous pulse oximetry - Give Acetaminophen 650 mg PO or rectal PRN for pain or fever - Consider adding LAMA such as Spiriva or Incruse Ellipta to home regimen for COPD management given N-stage disease; both on patient's formulary, Incruse with easier mouthpiece to use  #Hypokalemia: 3.2 this morning, 3.1 on presentation. Likely due to decreased PO intake. - Continue K 40 mEq PO BID - Encourage PO intake - AM BMP  #Chronic pain:  - Continue home dose Oxycodone-acetaminophen 5-325 mg Q8H PRN for severe pain - Continue home dose Gabapentin 300 mg at bedtime - Senna-docusate 1 tablet PRN for constipation  #Bipolar disorder: - Continue home dose Lamotrigine 100 mg daily - Continue home dose Olanzapine 2.5 mg at bedtime  #Allergies: - Continue home dose Levocetirizine 5 mg PO daily - Give Fluticasone 2 sprays daily  #Osteoporosis: - Continue home dose Cholecalciferol 1,000 mg  daily  #Depression: - Continue Venlafaxine 37.5 mg every evening  #Reflux: - Continue Pantoprazole 40 mg BID  #Tobacco use: Cut back to 2-3 cigarettes/day for past 3 years. Commended. - Encourage cessation  #FEN: Regular diet  #VTE prophylaxis: Enoxaparin 40 mg SQ daily  #Stage 2 pressure ulcer of left buttock -foam dressing applied -   #Code: DNR/DNI  #Dispo: Pending clinical improvement    LOS: 1 day   [name], Medical Student 09/01/2016, 11:11 AM   Attestation for Student Documentation:  I personally was present and performed or re-performed the history, physical exam and medical decision-making activities of this service and have verified that the service and findings are accurately documented in the student's note and I have edited in full  Lucious Groves, DO 09/01/2016, 2:49 PM

## 2016-09-01 NOTE — Progress Notes (Signed)
Initial Nutrition Assessment  DOCUMENTATION CODES:   Underweight, Severe malnutrition in context of chronic illness  INTERVENTION:   -D/c Ensure Enlive po BID, each supplement provides 350 kcal and 20 grams of protein -Boost Breeze po TID, each supplement provides 250 kcal and 9 grams of protein -30 ml Prostat BID, each supplement provides 100 kcals and 15 grams protein -MVI dialy  NUTRITION DIAGNOSIS:   Malnutrition (severe) related to chronic illness (COPD) as evidenced by moderate depletion of body fat, severe depletion of body fat, moderate depletions of muscle mass, severe depletion of muscle mass, energy intake < 75% for > or equal to 1 month.  GOAL:   Patient will meet greater than or equal to 90% of their needs  MONITOR:   PO intake, Supplement acceptance, Labs, Weight trends, Skin, I & O's  REASON FOR ASSESSMENT:   Malnutrition Screening Tool    ASSESSMENT:   Ms. Munshi is a 66 y.o. lady with a PMHx of COPD on continuous 3L nasal cannula, chronic pain syndrome, bipolar disorder who just completed rehab s/p left hip fracture and surgery in March 2018 who presents with 3-4 days of dyspnea and productive cough.  Pt admitted with COPD exacerbation.   Spoke with pt, who reports ongoing poor appetite for a long time. Pt reports she mainly grazes throughout the day, consuming bites and sips of food and beverages. She does not like the taste of Ensure supplements- reporting the taste is "too chalky and thick". She reports she ate 1/3 of her eggs this morning ("I know I need the protein").   Pt reports UBW is around 90# and estimates she has lost 2-3# over the past 2-3 weeks.   Nutrition-Focused physical exam completed. Findings are moderate to severe fat depletion, moderate to severe muscle depletion, and no edema.   Discussed with pt importance of good meal and supplement intake to promote healing. Discussed ways to increase protein intake in diet.   Labs reviewed: K:  3.4  Diet Order:  Diet regular Room service appropriate? Yes; Fluid consistency: Thin  Skin:  Wound (see comment) (stage II buttocks)  Last BM:  08/30/16  Height:   Ht Readings from Last 1 Encounters:  08/31/16 5\' 1"  (1.549 m)    Weight:   Wt Readings from Last 1 Encounters:  08/31/16 90 lb (40.8 kg)    Ideal Body Weight:  47.7 kg  BMI:  Body mass index is 17.01 kg/m.  Estimated Nutritional Needs:   Kcal:  1450-1650  Protein:  70-85 grams  Fluid:  >1 4. L  EDUCATION NEEDS:   Education needs addressed  Celise Bazar A. Jimmye Norman, RD, LDN, CDE Pager: [phone] After hours Pager: [phone]

## 2016-09-02 DIAGNOSIS — L89322 Pressure ulcer of left buttock, stage 2: Secondary | ICD-10-CM

## 2016-09-02 LAB — BASIC METABOLIC PANEL
ANION GAP: 10 (ref 5–15)
BUN: 5 mg/dL — ABNORMAL LOW (ref 6–20)
CHLORIDE: 98 mmol/L — AB (ref 101–111)
CO2: 34 mmol/L — AB (ref 22–32)
Calcium: 9 mg/dL (ref 8.9–10.3)
Creatinine, Ser: 0.36 mg/dL — ABNORMAL LOW (ref 0.44–1.00)
GFR calc non Af Amer: >60 mL/min (ref >60)
Glucose, Bld: 87 mg/dL (ref 65–99)
POTASSIUM: 3.4 mmol/L — AB (ref 3.5–5.1)
SODIUM: 142 mmol/L (ref 135–145)

## 2016-09-02 MED ORDER — POTASSIUM CHLORIDE 20 MEQ PO PACK
40.0000 meq | PACK | Freq: Once | ORAL | Status: AC
  Administered 2016-09-02: 40 meq via ORAL
  Filled 2016-09-02: qty 2

## 2016-09-02 NOTE — Progress Notes (Signed)
Subjective: NAEO. Breathing improved but feels generally "lethargic". Endorses productive cough, brown in color. PO intake still low because of throat pain but eating some of every meal. Drinking fluids. Otherwise no fever, chest pain or palpitations. No BM since admission, sometimes goes a few days without BM at baseline. Not ready to go home, thinks she's too weak to handle her ADLs.  Objective:  Vital signs in last 24 hours: Vitals:   09/01/16 2006 09/01/16 2227 09/02/16 0126 09/02/16 0458  BP:  134/65  (!) 140/59  Pulse:  78 69 73  Resp:  18 18 18   Temp:  98 F (36.7 C)  97.8 F (36.6 C)  TempSrc:  Oral  Oral  SpO2: (!) 89% 96% 93% 100%  Weight:      Height:       Weight change:   Intake/Output Summary (Last 24 hours) at 09/02/16 0815 Last data filed at 09/02/16 0730  Gross per 24 hour  Intake              570 ml  Output              400 ml  Net              170 ml   General appearance: alert sitting in bed, cooperative, appears older than stated age Lungs: normal WOB, diminished breath sounds, intermittent end-expiratory wheezing Heart: regular rate and rhythm, S1, S2 normal, no murmur, click, rub or gallop Abdomen: soft, non-tender; bowel sounds normal; no masses,  no organomegaly Extremities: thin extremities, atraumatic, no cyanosis or edema Pulses: 2+ and symmetric Skin: Skin color, texture normal, turgor decreased. No rashes or lesions  Lab Results: Basic metabolic panel (Abnormal) Collected: 09/02/16 0704  Specimen: Blood Updated: 09/02/16 0750   Sodium 142 mmol/L    Potassium 3.4 (L) mmol/L    Chloride 98 (L) mmol/L    CO2 34 (H) mmol/L    Glucose, Bld 87 mg/dL    BUN 5 (L) mg/dL    Creatinine, Ser 0.36 (L) mg/dL    Calcium 9.0 mg/dL    GFR calc non Af Amer >60 mL/min    GFR calc Af Amer >60 mL/min    Anion gap 10  Blood culture (routine x 2)  Collected: 08/31/16 1541  Specimen: Blood from BLOOD LEFT FOREARM Updated: 09/01/16 1610   Specimen  Description BLOOD LEFT FOREARM   Special Requests BOTTLES DRAWN AEROBIC AND ANAEROBIC Blood Culture adequate volume   Culture NO GROWTH < 24 HOURS   Report Status PENDING  Blood culture (routine x 2)  Collected: 08/31/16 1542  Specimen: Blood from BLOOD Updated: 09/01/16 1610   Specimen Description BLOOD LEFT ANTECUBITAL   Special Requests BOTTLES DRAWN AEROBIC AND ANAEROBIC Blood Culture adequate volume   Culture NO GROWTH < 24 HOURS   Report Status PENDING    Assessment/Plan:  Principal Problem:   COPD (chronic obstructive pulmonary disease) with emphysema (HCC) Active Problems:   Protein-calorie malnutrition, severe (HCC)   Pneumonia   Hypokalemia   Pressure injury of skin  [name] is a 66 y.o. lady with a PMHx of COPD on continuous 3L nasal cannula, chronic pain syndrome, bipolar disorder who just completed rehab s/p left hip fracture and surgery in March 2018 here with an acute COPD exacerbation 2/2 PNA.  #Acute COPD exacerbation: ## Possible Left lingular Pneumonia  Breathing improved this morning, sats 89-100% on 3L O2 overnight, afebrile. Hx of COPD with acute worsening of breathing status, wheezing, a  productive cough, decreased oxygen saturation on home dose 3L O2 and CXR findings consistent with acute COPD exacerbation 2/2 pneumonia. Received single doses of Vancomycin and Cefepime on presentation. - Continue IV Ceftriaxone 1 g daily, transition to PO Azithromycin 500 mg for CAP, day 3 of treatment - Continue Ipatropium-albuterol 3 mL Q6H; Albuterol PRN - Continue Prednisone 40 mg daily X4 days - Continue 3L O2 nasal cannula; keep O2 sats 88-92% - Guaifensin-dextro 5 mL Q4H PRN for cough - Cepacol lozenge 3 mg PRN, Phenol mouth spray PRN for throat pain - Acetaminophen 650 mg PO PRN for pain or fever - AM CBC - Consider adding LAMA Spiriva to home regimen for COPD management given N-stage disease  #Fatigue: Feels deconditioned and unsure of ability to handle ADLs  at home. Lives alone. - PT consult for evaluation and treatment to improve function  #Hypokalemia: 3.4 this morning, 3.2yesterday. Improving. Likely due to decreased PO intake. - Continue K 40 mEq PO X1 - Encourage PO intake - AM BMP  #Chronic pain:  - Continue home dose Oxycodone-acetaminophen 5-325 mg Q8H PRN for severe pain - Continue home dose Gabapentin 300 mg at bedtime - Senna-docusate 1 tablet PRN for constipation  #Bipolar disorder: - Continue home dose Lamotrigine 100 mg daily - Continue home dose Olanzapine 2.5 mg at bedtime  #Allergies: - Continue home dose Levocetirizine 5 mg PO daily - Give Fluticasone 2 sprays daily  #Osteoporosis: - Continue home dose Cholecalciferol 1,000 mg daily  #Depression: - Continue Venlafaxine 37.5 mg every evening  #Reflux: - Continue Pantoprazole 40 mg daily  #Tobacco use: Cut back to 2-3 cigarettes/day for past 3 years. Commended. - Encourage cessation  #FEN:Regular diet  #VTE prophylaxis: Enoxaparin 40 mg SQ daily; encourage ambulation with assistance  #Stage 2 pressure ulcer of left buttock -foam dressing applied  #Code:DNR/DNI  #Dispo: Tomorrow    LOS: 2 days   Loleta Books, Medical Student 09/02/2016, 8:15 AM   Attestation for Student Documentation:  I personally was present and performed or re-performed the history, physical exam and medical decision-making activities of this service and have verified that the service and findings are accurately documented in the student's note.  Jule Ser, DO 09/02/2016, 12:41 PM

## 2016-09-02 NOTE — Evaluation (Signed)
Physical Therapy Evaluation Patient Details Name: [name] MRN: [id number] DOB: [birth year] Today's Date: 09/02/2016   History of Present Illness  Pt is 66 y/o female admitted secondary to COPD exacerbation secondary to pneumonia. PMH includes L hip fx s/p IM nail placement in March of 2018 (went to rehab; had bad experience), bipolar, anxiety, duodenal ulcer, substance abuse, L ankle ORIF, and COPD on 3L of oxygen at home.   Clinical Impression  Pt admitted secondary to problem above with deficits below. PTA, pt was using RW, however, reports she needed more assistance at home. Reports she was giving herself bird baths and was unable to shower like she would like. Required assist for grocery shopping as well. Upon evaluation, pt very limited by pain, cardiopulmonary status, decreased endurance, weakness, and decreased balance. Pt requiring min guard to min A and only able to tolerate very short distances without requiring seated rest break. Oxygen sats to 88% on 3L with short ambulation distances, however, increase to 92% on 3L with seated rest and pursed lip breathing. Educated about SNF, however, adamantly refusing SNF. Will need HHPT, [place], and [facility], since pt refusing SNF. Also recommending OT consult to assess safety with ADLs. Will continue to follow acutely to maximize functional mobility independence.     Follow Up Recommendations Home health PT;Supervision/Assistance - 24 hour (HHOT, [facility])    Equipment Recommendations  Wheelchair (measurements PT);Wheelchair cushion (measurements PT)    Recommendations for Other Services OT consult     Precautions / Restrictions Precautions Precautions: Fall Restrictions Weight Bearing Restrictions: No      Mobility  Bed Mobility               General bed mobility comments: In chair upon entry   Transfers Overall transfer level: Needs assistance Equipment used: Rolling walker (2 wheeled) Transfers: Sit to/from Stand Sit to  Stand: Min assist;Min guard         General transfer comment: Min A initially for lift assist, however, progressed to min guard by end of session. Demonstrated safe hand placement.   Ambulation/Gait Ambulation/Gait assistance: Min guard Ambulation Distance (Feet): 15 Feet Assistive device: Rolling walker (2 wheeled) Gait Pattern/deviations: Step-to pattern;Decreased step length - left;Decreased weight shift to left;Antalgic;Trunk flexed Gait velocity: Decreased Gait velocity interpretation: Below normal speed for age/gender General Gait Details: 68' X 2 with min guard A for safety. Slow, antalgic gait secondary to L hip pain. Reports LLE is shorter than RLE. Required seated rest secondary to SOB and fatigue. Oxygen sats checked. 88% on 3L for 15', but with seated rest and cues for pursed lip breathing, able to increase oxygen sats to 92% on 3L. Pt reports feeling weaker than normal.   Stairs            Wheelchair Mobility    Modified Rankin (Stroke Patients Only)       Balance Overall balance assessment: Needs assistance Sitting-balance support: No upper extremity supported;Feet supported Sitting balance-Leahy Scale: Fair     Standing balance support: Bilateral upper extremity supported;During functional activity Standing balance-Leahy Scale: Poor Standing balance comment: Reliant on RW for stability                              Pertinent Vitals/Pain Pain Assessment: 0-10 Pain Score: 10-Worst pain ever Pain Location: back and L hip  Pain Descriptors / Indicators: Throbbing Pain Intervention(s): Limited activity within patient's tolerance;Monitored during session;Repositioned    Home Living Family/patient  expects to be discharged to:: Private residence Living Arrangements: Alone Available Help at Discharge: Friend(s);Available PRN/intermittently Type of Home: Apartment Home Access: Level entry     Home Layout: One level Home Equipment: Walker - 4  wheels;Shower seat;Other (comment);Grab bars - tub/shower;Grab bars - toilet (oxygen )      Prior Function Level of Independence: Needs assistance   Gait / Transfers Assistance Needed: Used rollator   ADL's / Homemaking Assistance Needed: Reports she only gave herself bird baths because she was nervous to get in tub. Limited to household distances. Friend assists with grocery shopping.         Hand Dominance   Dominant Hand: Right    Extremity/Trunk Assessment   Upper Extremity Assessment Upper Extremity Assessment: Generalized weakness    Lower Extremity Assessment Lower Extremity Assessment: LLE deficits/detail LLE Deficits / Details: S/p IM nail placement in March 2018. Continues to be painful and is weak. Unable to formally assess secondary to pain.     Cervical / Trunk Assessment Cervical / Trunk Assessment: Kyphotic  Communication   Communication: No difficulties  Cognition Arousal/Alertness: Awake/alert Behavior During Therapy: WFL for tasks assessed/performed Overall Cognitive Status: Within Functional Limits for tasks assessed                                        General Comments General comments (skin integrity, edema, etc.): Educated about current deficits and need for follow up SNF, however, pt adamantly refusing and stating she will never go back to a facility again. Will need HHPT, [place], and [facility] at d/c to ensure safety at home.    Exercises     Assessment/Plan    PT Assessment Patient needs continued PT services  PT Problem List Decreased strength;Decreased range of motion;Decreased activity tolerance;Decreased balance;Decreased mobility;Decreased coordination;Decreased knowledge of use of DME;Decreased safety awareness;Decreased knowledge of precautions;Pain       PT Treatment Interventions DME instruction;Gait training;Functional mobility training;Therapeutic activities;Therapeutic exercise;Neuromuscular re-education;Balance  training;Patient/family education    PT Goals (Current goals can be found in the Care Plan section)  Acute Rehab PT Goals Patient Stated Goal: to get stronger and then go home  PT Goal Formulation: With patient Time For Goal Achievement: 09/09/16 Potential to Achieve Goals: Fair    Frequency Min 3X/week   Barriers to discharge Decreased caregiver support Pt lives alone and no one can come stay with her    Co-evaluation               AM-PAC PT "6 Clicks" Daily Activity  Outcome Measure Difficulty turning over in bed (including adjusting bedclothes, sheets and blankets)?: A Lot Difficulty moving from lying on back to sitting on the side of the bed? : Total Difficulty sitting down on and standing up from a chair with arms (e.g., wheelchair, bedside commode, etc,.)?: Total Help needed moving to and from a bed to chair (including a wheelchair)?: A Little Help needed walking in hospital room?: A Little Help needed climbing 3-5 steps with a railing? : A Lot 6 Click Score: 12    End of Session Equipment Utilized During Treatment: Gait belt;Oxygen Activity Tolerance: Patient limited by fatigue;Treatment limited secondary to medical complications (Comment) (SOB, decreased oxygen sats) Patient left: in chair;with call bell/phone within reach;with chair alarm set Nurse Communication: Mobility status PT Visit Diagnosis: Unsteadiness on feet (R26.81);Muscle weakness (generalized) (M62.81);Pain Pain - Right/Left: Left Pain - part of body:  Leg;Hip    Time: 8676-7209 PT Time Calculation (min) (ACUTE ONLY): 31 min   Charges:   PT Evaluation $PT Eval Moderate Complexity: 1 Procedure PT Treatments $Gait Training: 8-22 mins   PT G Codes:        Leighton Ruff, PT, DPT  Acute Rehabilitation Services  Pager: [phone]   Rudean Hitt 09/02/2016, 4:58 PM

## 2016-09-03 DIAGNOSIS — J189 Pneumonia, unspecified organism: Secondary | ICD-10-CM

## 2016-09-03 DIAGNOSIS — E43 Unspecified severe protein-calorie malnutrition: Secondary | ICD-10-CM

## 2016-09-03 DIAGNOSIS — Z681 Body mass index (BMI) 19 or less, adult: Secondary | ICD-10-CM

## 2016-09-03 DIAGNOSIS — J439 Emphysema, unspecified: Secondary | ICD-10-CM

## 2016-09-03 LAB — BASIC METABOLIC PANEL
ANION GAP: 11 (ref 5–15)
BUN: 5 mg/dL — ABNORMAL LOW (ref 6–20)
CHLORIDE: 96 mmol/L — AB (ref 101–111)
CO2: 35 mmol/L — AB (ref 22–32)
Calcium: 8.6 mg/dL — ABNORMAL LOW (ref 8.9–10.3)
Creatinine, Ser: 0.35 mg/dL — ABNORMAL LOW (ref 0.44–1.00)
GFR calc Af Amer: >60 mL/min (ref >60)
GFR calc non Af Amer: >60 mL/min (ref >60)
Glucose, Bld: 78 mg/dL (ref 65–99)
POTASSIUM: 3.4 mmol/L — AB (ref 3.5–5.1)
Sodium: 142 mmol/L (ref 135–145)

## 2016-09-03 MED ORDER — POTASSIUM CHLORIDE 20 MEQ PO PACK
40.0000 meq | PACK | Freq: Once | ORAL | Status: AC
  Administered 2016-09-03: 40 meq via ORAL
  Filled 2016-09-03: qty 2

## 2016-09-03 MED ORDER — PREDNISONE 20 MG PO TABS: 40.0000 mg | ORAL_TABLET | Freq: Every day | ORAL | 0 refills | Status: DC

## 2016-09-03 MED ORDER — AZITHROMYCIN 500 MG PO TABS: 500.0000 mg | ORAL_TABLET | Freq: Every day | ORAL | 0 refills | Status: DC

## 2016-09-03 NOTE — Evaluation (Addendum)
Occupational Therapy Evaluation Patient Details Name: [name] MRN: [id number] DOB: 09-24-1950 Today's Date: 09/03/2016    History of Present Illness Pt is 66 y/o female admitted secondary to COPD exacerbation secondary to pneumonia. PMH includes L hip fx s/p IM nail placement in March of 2018 (went to rehab; had bad experience), bipolar, anxiety, duodenal ulcer, substance abuse, L ankle ORIF, and COPD on 3L of oxygen at home.    Clinical Impression   PTA, pt had been "managing" at home and reports difficulty with LB ADL and inability to complete tub transfers safely. Pt currently requires mod assist for LB ADL secondary to L LE pain since hip IM nail in March, min guard assist for toilet transfers, and supervision for UB ADL in seated position. She presents with decreased activity tolerance for ADL and generalized weakness impacting her ability to safely participate in ADL tasks. Initiated education concerning energy conservation strategies. She would benefit from short-term SNF placement for continued rehabilitation in order to maximize safety and independence with ADL. However, pt is adamantly declining SNF at this time. Thus, recommend maximizing home health services including home health OT, PT, and aide. Will continue to follow while admitted.     Follow Up Recommendations  Home health OT (Maximum HH services as pt declining SNF)    Equipment Recommendations  3 in 1 bedside commode    Recommendations for Other Services       Precautions / Restrictions Precautions Precautions: Fall;Other (comment) Precaution Comments: watch O2 sats Restrictions Weight Bearing Restrictions: No      Mobility Bed Mobility               General bed mobility comments: In chair upon entry   Transfers Overall transfer level: Needs assistance Equipment used:  (pt declined use of Rollator for stand-pivot transfer.) Transfers: Stand Pivot Transfers Sit to Stand: Min guard Stand pivot  transfers: Min guard       General transfer comment: Min guard assist for safety. Pt declined use of rollator.    Balance Overall balance assessment: Needs assistance Sitting-balance support: No upper extremity supported;Feet supported Sitting balance-Leahy Scale: Fair     Standing balance support: During functional activity;Single extremity supported Standing balance-Leahy Scale: Poor Standing balance comment: Reliant on single UE support as well as min guard assist for safety and stability.                            ADL either performed or assessed with clinical judgement   ADL Overall ADL's : Needs assistance/impaired Eating/Feeding: Set up;Sitting   Grooming: Set up;Sitting   Upper Body Bathing: Supervision/ safety;Sitting   Lower Body Bathing: Moderate assistance;Sit to/from stand   Upper Body Dressing : Supervision/safety;Sitting   Lower Body Dressing: Moderate assistance;Sit to/from stand   Toilet Transfer: Min Statistician Details (indicate cue type and reason): Pt declined use of rollator during stand-pivot transfer.  Toileting- Water quality scientist and Hygiene: Min guard;Sitting/lateral lean       Functional mobility during ADLs: Min guard;Rolling walker General ADL Comments: Pt limited by cardiopulmonary status, generalized weakness, and LLE pain.      Vision Patient Visual Report: No change from baseline Vision Assessment?: No apparent visual deficits     Perception     Praxis      Pertinent Vitals/Pain Pain Assessment: Faces Faces Pain Scale: Hurts even more Pain Location: L hip Pain Descriptors / Indicators: Throbbing Pain Intervention(s): Limited activity within  patient's tolerance;Monitored during session;Repositioned     Hand Dominance Right   Extremity/Trunk Assessment Upper Extremity Assessment Upper Extremity Assessment: Generalized weakness   Lower Extremity Assessment Lower Extremity  Assessment: Defer to PT evaluation   Cervical / Trunk Assessment Cervical / Trunk Assessment: Kyphotic   Communication Communication Communication: No difficulties   Cognition Arousal/Alertness: Awake/alert Behavior During Therapy: WFL for tasks assessed/performed Overall Cognitive Status: Within Functional Limits for tasks assessed                                     General Comments  Pt initially reporting that she does not need occupational therapy. Educated pt on OT role concerning improving independence with both ADL and IADL and she reported that she was interested in continuing with OT in order to be able to participate in her home management and basic ADL tasks with increased independence as she had not been able to take a shower for 2 weeks at home.     Exercises     Shoulder Instructions      Home Living Family/patient expects to be discharged to:: Private residence Living Arrangements: Alone Available Help at Discharge: Friend(s);Available PRN/intermittently Type of Home: Apartment Home Access: Level entry     Home Layout: One level     Bathroom Shower/Tub: Teacher, early years/pre: Handicapped height     Home Equipment: Environmental consultant - 4 wheels;Shower seat;Other (comment);Grab bars - tub/shower;Grab bars - toilet (oxygen)          Prior Functioning/Environment Level of Independence: Needs assistance  Gait / Transfers Assistance Needed: Used rollator  ADL's / Homemaking Assistance Needed: Reports she only gave herself bird baths because she was nervous to get in tub. Reports she had not taken a shower in 2 weeks because she was waiting on an aide to be set-up. Reports difficulty with LB dressing tasks.  Limited to household distances. Friend assists with grocery shopping.             OT Problem List: Decreased strength;Decreased activity tolerance;Impaired balance (sitting and/or standing);Decreased safety awareness;Decreased knowledge  of use of DME or AE;Pain      OT Treatment/Interventions: Self-care/ADL training;Therapeutic exercise;Energy conservation;DME and/or AE instruction;Therapeutic activities;Patient/family education;Balance training    OT Goals(Current goals can be found in the care plan section) Acute Rehab OT Goals Patient Stated Goal: to get stronger and then go home  OT Goal Formulation: With patient Time For Goal Achievement: 09/17/16 Potential to Achieve Goals: Good ADL Goals Pt Will Perform Grooming: standing;with modified independence Pt Will Perform Upper Body Dressing: with modified independence;sitting Pt Will Perform Lower Body Dressing: with adaptive equipment;sit to/from stand;with modified independence Pt Will Transfer to Toilet: with modified independence;ambulating;bedside commode (BSC over toilet) Pt Will Perform Toileting - Clothing Manipulation and hygiene: with modified independence;sit to/from stand Pt Will Perform Tub/Shower Transfer: Tub transfer;3 in 1;ambulating;rolling walker;with min assist Additional ADL Goal #1: Pt will generalize 3 strategies to conserve energy into daily ADL routine.   OT Frequency: Min 2X/week   Barriers to D/C:            Co-evaluation              AM-PAC PT "6 Clicks" Daily Activity     Outcome Measure Help from another person eating meals?: A Little Help from another person taking care of personal grooming?: A Little Help from another person toileting, which includes using  toliet, bedpan, or urinal?: A Little Help from another person bathing (including washing, rinsing, drying)?: A Lot Help from another person to put on and taking off regular upper body clothing?: A Little Help from another person to put on and taking off regular lower body clothing?: A Lot 6 Click Score: 16   End of Session Equipment Utilized During Treatment: Oxygen (3L O2) Nurse Communication: Mobility status  Activity Tolerance: Patient tolerated treatment  well Patient left: in chair;with call bell/phone within reach  OT Visit Diagnosis: Muscle weakness (generalized) (M62.81);Pain Pain - Right/Left: Left Pain - part of body: Hip                Time: 1355-1411 OT Time Calculation (min): 16 min Charges:  OT General Charges $OT Visit: 1 Procedure OT Evaluation $OT Eval Moderate Complexity: 1 Procedure G-Codes:     Norman Herrlich, MS OTR/L  Pager: [place] [facility] [place] [facility] 09/03/2016, 4:48 PM

## 2016-09-03 NOTE — Discharge Summary (Signed)
Name: [name] MRN: [id number] DOB: Mar 13, 1951 66 y.o. PCP: Riccardo Dubin, MD  Date of Admission: 08/31/2016  2:16 PM Date of Discharge: 09/03/2016 Attending Physician: Lucious Groves, DO  Discharge Diagnosis:  Principal Problem:   COPD (chronic obstructive pulmonary disease) with emphysema ([place]) Active Problems:   Protein-calorie malnutrition, severe ([place])   Pneumonia   Hypokalemia   Pressure injury of skin   Discharge Medications: Allergies as of 09/03/2016      Reactions   Banana Shortness Of Breath, Nausea And Vomiting   Lyrica [pregabalin] Other (See Comments)   "Disorients me"   Pollen Extract Other (See Comments)   Seasonal       Medication List    TAKE these medications   albuterol 108 (90 Base) MCG/ACT inhaler Commonly known as:  PROAIR HFA INHALE ONE PUFF BY MOUTH EVERY 2 TO 4 HOURS AS NEEDED FOR WHEEZING   alendronate 10 MG tablet Commonly known as:  FOSAMAX Take 1 tablet (10 mg total) by mouth daily before breakfast. Take with a full glass of water on an empty stomach.   azithromycin 500 MG tablet Commonly known as:  ZITHROMAX Take 1 tablet (500 mg total) by mouth daily. Start taking on:  09/04/2016   Cholecalciferol 1000 units Tbdp Take 1,000 mg by mouth daily.   fluticasone 50 MCG/ACT nasal spray Commonly known as:  FLONASE Place 2 sprays into both nostrils daily.   Fluticasone-Salmeterol 250-50 MCG/DOSE Aepb Commonly known as:  ADVAIR DISKUS INHALE 1 PUFF INTO THE LUNGS 2 TIMES DAILY   gabapentin 300 MG capsule Commonly known as:  NEURONTIN Take 300 mg by mouth at bedtime.   HYDROcodone-acetaminophen 5-325 MG tablet Commonly known as:  NORCO/VICODIN Take 1-2 tablets by mouth every 4 (four) hours as needed for moderate pain.   ipratropium-albuterol 0.5-2.5 (3) MG/3ML Soln Commonly known as:  DUONEB 4 times a day (breakfast, lunch, dinner, bedtime).Can take 2 additional treatments if needed on bad days. DX J43.8 What  changed:  how much to take  how to take this  when to take this  additional instructions   lamoTRIgine 100 MG tablet Commonly known as:  LAMICTAL TAKE ONE TABLET EVERY DAY   levocetirizine 5 MG tablet Commonly known as:  XYZAL Take 5 mg by mouth at bedtime.   Melatonin 10 MG Tabs Take 1 tablet by mouth at bedtime as needed. What changed:  when to take this   OLANZapine 2.5 MG tablet Commonly known as:  ZYPREXA Take 2.5 mg by mouth at bedtime.   ondansetron 4 MG tablet Commonly known as:  ZOFRAN Take 1 tablet (4 mg total) by mouth every 8 (eight) hours as needed for nausea or vomiting.   oxyCODONE 5 MG immediate release tablet Commonly known as:  Oxy IR/ROXICODONE Take 5-10 mg by mouth See admin instructions. Take 1 tablet for moderate pain or 2 tablets for severe pain every 4 hours for post surgical pain.   OXYGEN Inhale 3 L into the lungs continuous.   polyethylene glycol powder powder Commonly known as:  GLYCOLAX/MIRALAX TAKE 17G DISSOLVED IN 8OZ WATER DAILY ASNEEDED FOR MODERATE CONSTIPATION   predniSONE 20 MG tablet Commonly known as:  DELTASONE Take 2 tablets (40 mg total) by mouth daily with breakfast. Start taking on:  09/04/2016   venlafaxine XR 37.5 MG 24 hr capsule Commonly known as:  EFFEXOR-XR Take 37.5 mg by mouth every evening.   white petrolatum Gel Commonly known as:  VASELINE Apply 1 application topically daily.  To both feet for dry skin            Durable Medical Equipment        Start     Ordered   09/03/16 1126  For home use only DME 4 wheeled rolling walker with seat  Once    Question:  Patient needs a walker to treat with the following condition  Answer:  Weakness   09/03/16 1125   09/03/16 1035  For home use only DME lightweight manual wheelchair with seat cushion  Once    Comments:  Patient suffers from COPD on oxygen, s/p hip fracture march 2018, left ankle ORIF,  which impairs their ability to perform daily activities like  bathing and feeding in the home.  A walker will not resolve  issue with performing activities of daily living. A wheelchair will allow patient to safely perform daily activities. Patient is not able to propel themselves in the home using a standard weight wheelchair due to general weakness. Patient can self propel in the lightweight wheelchair.  Accessories: elevating leg rests (ELRs), wheel locks, extensions and anti-tippers.   09/03/16 1039      Disposition and follow-up:   Ms.Sueann H Truby was discharged from [facility] in Stable condition.    1.  At the hospital follow up visit please address:  - consider adding LAMA to her COPD regimen - consider palliative care referral - ensure she is receiving enough support at home with ADLs (pt declined SNF referral during hospitalization)  2.  Labs / imaging needed at time of follow-up: BMET, CBC  3.  Pending labs/ test needing follow-up: none  Follow-up Appointments: Follow-up Information    [facility] Follow up.   Why:    Resumption of home health services( RN,PT, and Nurse Aide arranged, office will call and setup home visits Contact information:     [address], [place], [place] [address]  [phone]       Riccardo Dubin, MD. Schedule an appointment as soon as possible for a visit.   Specialty:  Internal Medicine Why:  Please call the Internal Medicine Center to schedule a hospital follow up appointment. Contact information: [place] [address] [phone]           Hospital Course by problem list: Principal Problem:   COPD (chronic obstructive pulmonary disease) with emphysema ([facility]) Active Problems:   Protein-calorie malnutrition, severe ([facility])   Pneumonia   Hypokalemia   Pressure injury of skin    # Acute COPD exacerbation: # Possible Left lingular Pneumonia  Saturation 85% on 3 L of O2 on presentation. History of COPD with acute worsening of breathing  status, wheezing, a productive cough, decreased oxygen saturation on home dose 3L O2 and CXR findings showing hyperinflation of lungs consistent with COPD and increased lingular opacity concerning for pneumonia consistent with acute COPD exacerbation 2/2 pneumonia. Treated with IV Azithromycin 500 mg daily, IV Ceftriaxone 1 g daily for CAP,  Ipatropium-albuterol 3 mL Q6H; Albuterol PRN, Prednisone 40 mg daily X4 days and O2 nasal cannula with goal of sats between 88-92%. Symptomatic relief provided for cough and throat pain. Patient was afebrile without an elevated WBC count during course of stay. Transitioned to PO Azithromycin 500 mg for continued treatment at home, to be taken for 2 days after discharge.  Consider adding a LAMA such as Spiriva to home regimen for COPD management given end-stage disease.  Resume daily  Advair use, Duonebs QID, and ProAir use as needed. Continue on 3L nasal cannula.  She was evaluated by our physical therapist but adamantly refuses SNF.  She is frail and there is concern that she will not be able to manage at home.  She was therefore provided home health services to assist her in the home.  She was provided with DME orders per therapy recommendations and patient preference.  Could consider palliative care referral as well.   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 Foam dressing applied during stay.   Discharge Vitals:   BP (!) 164/71 (BP Location: Left Arm)   Pulse 80   Temp 98.5 F (36.9 C)   Resp 18   Ht 5\' 1"  (1.549 m)   Wt 90 lb (40.8 kg)   SpO2 97%   BMI 17.01 kg/m   Pertinent Labs, Studies, and Procedures:  Chest X-ray IMPRESSION: Advanced bullous emphysema.  Increasing lingular opacity concerning for pneumonia.  BMP Latest Ref Rng & Units 09/03/2016 09/02/2016 09/01/2016  Glucose 65 - 99 mg/dL 78 87 207(H)  BUN 6 - 20 mg/dL 5(L) 5(L) 6  Creatinine 0.44 - 1.00 mg/dL 0.35(L) 0.36(L) 0.34(L)   BUN/Creat Ratio 11 - 26 - - -  Sodium 135 - 145 mmol/L 142 142 140  Potassium 3.5 - 5.1 mmol/L 3.4(L) 3.4(L) 3.2(L)  Chloride 101 - 111 mmol/L 96(L) 98(L) 98(L)  CO2 22 - 32 mmol/L 35(H) 34(H) 33(H)  Calcium 8.9 - 10.3 mg/dL 8.6(L) 9.0 8.7(L)     Discharge Instructions: Discharge Instructions    Call MD for:  difficulty breathing, headache or visual disturbances    Complete by:  As directed    Call MD for:  persistant dizziness or light-headedness    Complete by:  As directed    Call MD for:  severe uncontrolled pain    Complete by:  As directed    Call MD for:  temperature >100.4    Complete by:  As directed    Diet - low sodium heart healthy    Complete by:  As directed    Discharge instructions    Complete by:  As directed    Ms Lunz,  It was a pleasure taking care of you in the hospital.  You were admitted with a COPD exacerbation and potentially a mild Pneumonia.  We have treated you for both with steroids and antibiotics.  You have 2 more days left of both after you go home.  Because you did not want to go to a skilled rehab facility, we are discharging you with all the home health services we can provide such as physical therapy, occupational therapy, nursing, and an aide.  Please call the clinic to schedule a hospital follow up appointment this week.  Take Care.   Increase activity slowly    Complete by:  As directed       Signed: Jule Ser, DO 09/03/2016, 11:39 AM   Pager: [phone]

## 2016-09-03 NOTE — Progress Notes (Signed)
  Date: 09/03/2016  Patient name: [name]  Medical record number: [id number]  Date of birth: 09-16-1950   I have seen and evaluated this patient and I have discussed the plan of care with the house staff. Please see Dr. Alcario Drought note for complete details. I concur with his findings with the following additions:   Patient is frail, and there is true concern that she will not be able to manage at home.  However, she adamantly refuses SNF placement.  We will provide her with the most home health services which can be arranged.  She will follow up closely in our clinic.    Sid Falcon, MD 09/03/2016, 12:30 PM

## 2016-09-03 NOTE — Progress Notes (Addendum)
Patient being discharged home with instructions given on medications,and follow up visits,patient verbalized understanding. Prescriptions sent to Pharmacy of choice documented on AVS. Patient on chronic oxygen at 3 liters via nasal canula, to be transported by EMS . Awaiting for transportation.

## 2016-09-03 NOTE — Progress Notes (Signed)
Physical Therapy Treatment Patient Details Name: [name] MRN: [id number] DOB: October 21, 1950 Today's Date: 09/03/2016    History of Present Illness Pt is 66 y/o female admitted secondary to COPD exacerbation secondary to pneumonia. PMH includes L hip fx s/p IM nail placement in March of 2018 (went to rehab; had bad experience), bipolar, anxiety, duodenal ulcer, substance abuse, L ankle ORIF, and COPD on 3L of oxygen at home.     PT Comments    Pt progressing well. Significant increase in gait ability. Pt able to ambulate 150 feet with rollator. Decreased assist required for all aspect of functional mobility.   Follow Up Recommendations  Home health PT;Supervision/Assistance - 24 hour (HHOT, HHaide)     Equipment Recommendations  Wheelchair (measurements PT);Wheelchair cushion (measurements PT)    Recommendations for Other Services       Precautions / Restrictions Precautions Precautions: Fall;Other (comment) Precaution Comments: watch O2 sats Restrictions Weight Bearing Restrictions: No    Mobility  Bed Mobility               General bed mobility comments: In chair upon entry   Transfers   Equipment used: Ambulation equipment used Transfers: Stand Pivot Transfers Sit to Stand: Supervision Stand pivot transfers: Supervision       General transfer comment: increased time to complete at supervision level  Ambulation/Gait Ambulation/Gait assistance: Min guard Ambulation Distance (Feet): 150 Feet Assistive device: 4-wheeled walker Gait Pattern/deviations: Step-through pattern;Decreased stride length Gait velocity: Decreased Gait velocity interpretation: Below normal speed for age/gender General Gait Details: Pt ambulated on 3 L O2. Seated rest break after 100 feet due to desat to 85%. 2-minute seated recovery time to 90%.   Stairs            Wheelchair Mobility    Modified Rankin (Stroke Patients Only)       Balance                                             Cognition Arousal/Alertness: Awake/alert Behavior During Therapy: WFL for tasks assessed/performed Overall Cognitive Status: Within Functional Limits for tasks assessed                                        Exercises      General Comments        Pertinent Vitals/Pain Pain Assessment: No/denies pain    Home Living                      Prior Function            PT Goals (current goals can now be found in the care plan section) Acute Rehab PT Goals Patient Stated Goal: to get stronger and then go home  PT Goal Formulation: With patient Time For Goal Achievement: 09/09/16 Potential to Achieve Goals: Fair Progress towards PT goals: Progressing toward goals    Frequency           PT Plan Current plan remains appropriate    Co-evaluation              AM-PAC PT "6 Clicks" Daily Activity  Outcome Measure  Difficulty turning over in bed (including adjusting bedclothes, sheets and blankets)?: A Little Difficulty moving from lying on back to sitting on  the side of the bed? : A Little Difficulty sitting down on and standing up from a chair with arms (e.g., wheelchair, bedside commode, etc,.)?: A Little Help needed moving to and from a bed to chair (including a wheelchair)?: None Help needed walking in hospital room?: A Little Help needed climbing 3-5 steps with a railing? : A Lot 6 Click Score: 18    End of Session Equipment Utilized During Treatment: Gait belt;Oxygen Activity Tolerance: Patient tolerated treatment well Patient left: in chair;with chair alarm set;with call bell/phone within reach Nurse Communication: Mobility status PT Visit Diagnosis: Unsteadiness on feet (R26.81);Muscle weakness (generalized) (M62.81)     Time: 5374-8270 PT Time Calculation (min) (ACUTE ONLY): 18 min  Charges:  $Gait Training: 8-22 mins                    G Codes:       Lorrin Goodell, PT  Office #  [phone] Pager [phone]    Lorriane Shire 09/03/2016, 11:38 AM

## 2016-09-03 NOTE — Progress Notes (Addendum)
   Subjective: Patient seen and examined this morning.  She does not want to leave the hospital yet due to feeling that she will be unable to perform ADLs at home such as cooking and toileting.  However, she still adamantly refuses SNF placement as would be recommended otherwise.  She is requesting a Rollator walker.  PT eval yesterday recommends a wheelchair with cushion.  Objective:  Vital signs in last 24 hours: Vitals:   09/02/16 2156 09/03/16 0125 09/03/16 0505 09/03/16 0842  BP: 130/67  (!) 164/71   Pulse: 69 74 80   Resp: 18 18 18    Temp: 98.3 F (36.8 C)  98.5 F (36.9 C)   TempSrc:      SpO2: 94% 98% 100% 97%  Weight:      Height:       Weight change:   Intake/Output Summary (Last 24 hours) at 09/03/16 1040 Last data filed at 09/03/16 0955  Gross per 24 hour  Intake              573 ml  Output             1550 ml  Net             -977 ml   General: sitting up in chair, on O2, NAD, thin, frail appearing HEENT: NCAT, EOMI, no scleral icterus Cardiac: RRR, no rubs, murmurs or gallops Pulm: diminished breath sounds bilaterally with normal effort.  Mild end expiratory wheezes. Neuro: alert and oriented X3, cranial nerves II-XII grossly intact   Assessment/Plan:  Principal Problem:   COPD (chronic obstructive pulmonary disease) with emphysema (HCC) Active Problems:   Protein-calorie malnutrition, severe (HCC)   Pneumonia   Hypokalemia   Pressure injury of skin  [name] is a 66 y.o. lady with a PMHx of COPD on continuous 3L nasal cannula, chronic pain syndrome, bipolar disorder who just completed rehab s/p left hip fracture and surgery in March 2018 here with an acute COPD exacerbation 2/2 PNA.  #Acute COPD exacerbation: ## Possible Left lingular Pneumonia  Her breathing is stable and medically appears ready for discharge.  We discussed her concerns regarding performing ADLs and that the best place to get assistance would be with a SNF placement.  However, she  declines this offer.  She will be discharged with [facility]. - Continue PO Azithromycin 500 mg, day 3/5 of treatment.  Discharge with 2 more days tx - Continue Prednisone 40 mg daily X 2 more days - Continue 3L O2 nasal cannula; keep O2 sats 88-92% - Continue home regimen at discharge: Advair, Duonebs, Albuterol PRN, and home oxygen  #Fatigue: Feels deconditioned and unsure of ability to handle ADLs at home. Lives alone, but declines SNF - Home health PT, OT, RN, Aide - DME wheelchair per PT recs  #Hypokalemia: 3.4 this morning - Replace with K 40 mEq PO X1 - Encourage PO intake - BMP at follow up  #Code:DNR/DNI  #Dispo: DC today    LOS: 3 days   Jule Ser, DO 09/03/2016, 10:40 AM

## 2016-09-03 NOTE — Care Management Note (Signed)
Case Management Note  Patient Details  Name: [name] MRN: [id number] Date of Birth: 08-22-1950  Subjective/Objective:                Spoke with patient, she states she has oxygen through [place], would like rollator not wc, order placed, referral called to [facility], will be delivered to room inabout 1.5 hours, patient active with [facility], called in referral for [facility] PT OT RN [facility].     Action/Plan:  No other CM needs.  Expected Discharge Date:  09/03/16               Expected Discharge Plan:  [place]  In-House Referral:     Discharge planning Services  CM Consult  Post Acute Care Choice:  Resumption of Svcs/PTA Provider, Durable Medical Equipment, Home Health Choice offered to:  Patient  DME Arranged:  Oxygen DME Agency:     HH Arranged:  PT, RN, Nurse's Aide [place] Agency:  [facility]  Status of Service:  Completed, signed off  If discussed at [place] of Stay Meetings, dates discussed:    Additional Comments:  Carles Collet, RN 09/03/2016, 11:39 AM

## 2016-09-05 LAB — CULTURE, BLOOD (ROUTINE X 2)
CULTURE: NO GROWTH
Culture: NO GROWTH
SPECIAL REQUESTS: ADEQUATE
Special Requests: ADEQUATE

## 2016-09-08 ENCOUNTER — Ambulatory Visit (INDEPENDENT_AMBULATORY_CARE_PROVIDER_SITE_OTHER): Payer: Medicare Other | Admitting: Internal Medicine

## 2016-09-08 VITALS — BP 126/59 | HR 83 | Temp 98.0°F | Ht 61.0 in

## 2016-09-08 DIAGNOSIS — G8929 Other chronic pain: Secondary | ICD-10-CM

## 2016-09-08 DIAGNOSIS — J029 Acute pharyngitis, unspecified: Secondary | ICD-10-CM

## 2016-09-08 DIAGNOSIS — F17210 Nicotine dependence, cigarettes, uncomplicated: Secondary | ICD-10-CM

## 2016-09-08 DIAGNOSIS — M549 Dorsalgia, unspecified: Secondary | ICD-10-CM

## 2016-09-08 DIAGNOSIS — I878 Other specified disorders of veins: Secondary | ICD-10-CM

## 2016-09-08 DIAGNOSIS — M545 Low back pain: Secondary | ICD-10-CM | POA: Diagnosis not present

## 2016-09-08 DIAGNOSIS — J439 Emphysema, unspecified: Secondary | ICD-10-CM | POA: Diagnosis not present

## 2016-09-08 DIAGNOSIS — Z79899 Other long term (current) drug therapy: Secondary | ICD-10-CM

## 2016-09-08 DIAGNOSIS — J040 Acute laryngitis: Secondary | ICD-10-CM | POA: Diagnosis not present

## 2016-09-08 DIAGNOSIS — Z9981 Dependence on supplemental oxygen: Secondary | ICD-10-CM | POA: Diagnosis not present

## 2016-09-08 DIAGNOSIS — K219 Gastro-esophageal reflux disease without esophagitis: Secondary | ICD-10-CM | POA: Diagnosis not present

## 2016-09-08 DIAGNOSIS — Z09 Encounter for follow-up examination after completed treatment for conditions other than malignant neoplasm: Secondary | ICD-10-CM

## 2016-09-08 DIAGNOSIS — G4700 Insomnia, unspecified: Secondary | ICD-10-CM

## 2016-09-08 DIAGNOSIS — G4709 Other insomnia: Secondary | ICD-10-CM

## 2016-09-08 DIAGNOSIS — Z8701 Personal history of pneumonia (recurrent): Secondary | ICD-10-CM

## 2016-09-08 DIAGNOSIS — J060 Acute laryngopharyngitis: Secondary | ICD-10-CM | POA: Insufficient documentation

## 2016-09-08 MED ORDER — CHOLECALCIFEROL 25 MCG (1000 UT) PO TBDP: 1000.0000 mg | ORAL_TABLET | Freq: Every day | ORAL | 2 refills | Status: DC

## 2016-09-08 MED ORDER — PANTOPRAZOLE SODIUM 40 MG PO TBEC: 40.0000 mg | DELAYED_RELEASE_TABLET | Freq: Every day | ORAL | 1 refills | Status: DC

## 2016-09-08 MED ORDER — ALENDRONATE SODIUM 10 MG PO TABS: 10.0000 mg | ORAL_TABLET | Freq: Every day | ORAL | 1 refills | Status: DC

## 2016-09-08 MED ORDER — LEVOCETIRIZINE DIHYDROCHLORIDE 5 MG PO TABS: 5.0000 mg | ORAL_TABLET | Freq: Every day | ORAL | 2 refills | Status: AC

## 2016-09-08 MED ORDER — MELATONIN 10 MG PO TABS: 1.0000 | ORAL_TABLET | Freq: Every evening | ORAL | 3 refills | Status: AC | PRN

## 2016-09-08 MED ORDER — PANTOPRAZOLE SODIUM 40 MG PO TBEC: 40.0000 mg | DELAYED_RELEASE_TABLET | Freq: Every day | ORAL | 1 refills | Status: AC

## 2016-09-08 NOTE — Progress Notes (Signed)
CC: For hospital follow-up after having a pneumonia.  HPI:  [name] is a 65 y.o. with past medical history listed below came to the clinic for hospital follow-up after discharge. She was admitted from 09/08/2016 till 09/03/2016 because of pneumonia.  Patient was feeling better and denies any more worsening of her shortness of breath,Cough is improving, she has completed her antibiotics and prednisone. She is compliant with her home oxygen. She was doing DuoNeb nebulizer 4 times a day and albuterol twice daily. She was concerned about her home health aide at it was discontinued during her hospital stay.  She was also complaining of worsening sore throat and muffled voice going on for 6-8 weeks. According to patient it is very painful to swallow. She denies any fever or chills.  She was also complaining of bilateral lower extremity edema, her caregiver noticed on Monday with some mottled appearance of skin.  She was also complaining of worsening lower back pain, she used to follow-up with a pain clinic , they stopped taking her insurance, she was given a referral to a different pain clinic she never had an appointment with them yet. She was asking for prescription for oxycodone, stating that this is the only medicine which helped she was also given a prescription of Vicodin in the past , according to patient that does not give her much relief . Because of her compromised pulmonary function and an history of overdosage, she was advised to make an appointment with her new pain clinic.  She was also complaining about insomnia and asking about a prescription for Ambien. I told her that Ambien is too risky for her, melatonin prescription was given.  Past Medical History:  Diagnosis Date  . Anemia   . Anxiety   . Arthritis    "all in my body, primarily in my hands" (08/31/2016)  . Bipolar disorder ([place])   . Chronic lower back pain   . Chronic pain syndrome    follows at [facility]  . COPD (chronic obstructive pulmonary disease) (HCC)    oxygen dependent (3L home continuous)  . DEFICIENCY, VITAMIN D NOS 01/03/2007  . Depression   . Duodenal ulcer, chronic   . Elevated liver function tests   . HCAP (healthcare-associated pneumonia) 08/31/2016  . History of blood transfusion 03/2014; 10/2014   "low HgB" (11/11/2014)  . Hyperthyroidism    "borderline"  . On home oxygen therapy    "3L; 24/7" (08/31/2016)  . OSTEOPOROSIS 06/17/2009   DEXA 05/2009 : L femur -2.9; R femur -2.5. Alendronate on med list but not taking. Needs addressed ASAP as h/o fractures.    . Pneumonia X 1?  . Sciatic pain   . Shortness of breath    "all the time" (02/27/2013)  . Substance abuse     narcotics, alcohol, tobacco  . Suicidal ideation 2007    attempted overdose 2012  Dr. Tivis Ringer report    Review of Systems:  As per HPI.  Physical Exam:  Vitals:   09/08/16 1049  BP: (!) 126/59  Pulse: 83  Temp: 98 F (36.7 C)  TempSrc: Oral  SpO2: 100%  Height: 5\' 1"  (1.549 m)    General: Vital signs reviewed.  Patient is well-developed and well-nourished, in no acute distress and cooperative with exam. HENT. No erythema or edema or pharyngeal exudate on limited exam.  Cardiovascular: RRR, S1 normal, S2 normal, no murmurs, gallops, or rubs. Pulmonary/Chest: Decreased air entry at right base, no rhonchi or  wheeze. Abdominal: Soft, non-tender, non-distended, BS +, no masses, organomegaly, or guarding present.  Extremities: 1+ lower extremity edema bilaterally, with some erythema more consistent with venous stasis. pulses symmetric and intact bilaterally. No cyanosis or clubbing. Psychiatric: Normal mood and affect. speech and behavior is normal. Cognition and memory are normal.  Assessment & Plan:   See Encounters Tab for problem based charting.  Patient discussed with Dr. Evette Doffing.

## 2016-09-08 NOTE — Assessment & Plan Note (Signed)
Because of the complexity of her pulmonary condition and history of overdosage in the past, she was advised to get an appointment with new pain clinic. We will assist her getting a quicker appointment.

## 2016-09-08 NOTE — Assessment & Plan Note (Signed)
Her lower extremity edema is more consistent with venous stasis.  She was advised to use the compression stockings with ambulation-prescription was provided and we did a in clinic fit. She was also advised to keep her feet elevated while resting.

## 2016-09-08 NOTE — Assessment & Plan Note (Signed)
She is compliant with her home oxygen. She was saturating 100% on 3 L.  She denies any worsening of her shortness of breath.  -A new order for home health care was placed. -Continue DuoNeb and albuterol nebulizer as directed.

## 2016-09-08 NOTE — Assessment & Plan Note (Signed)
She is having the symptoms of worsening sore throat and muffled voice for 6-8 weeks on exam there was no obvious erythema or exudate.  GERD can be a possibility. In order to have a good exam. She was given a referral to ENT for laryngoscopy. -She was advised to use saltwater gargles and lozenges.

## 2016-09-08 NOTE — Assessment & Plan Note (Signed)
Her sore throat might be due to worsening GERD. Although she denies any specific symptoms related to GERD.  -Protonix 40 mg daily.

## 2016-09-08 NOTE — Assessment & Plan Note (Signed)
She was asking for Ambien prescription.  I explained to her that Ambien is too risky for her, give her a prescription for melatonin.

## 2016-09-08 NOTE — Patient Instructions (Signed)
Thank you for visiting clinic today. I'm giving you a referral for ENT for your sore throat and muffled voice. We are also trying to reactivate your home. We will check your labs today. Please contact your pain clinic for your pain management. I'm also giving you a prescription for your acid reflux, as that can cause sore throat too. Please follow-up in about 4 weeks.

## 2016-09-09 LAB — BMP8+ANION GAP
ANION GAP: 18 mmol/L (ref 10.0–18.0)
BUN/Creatinine Ratio: 19 (ref 12–28)
BUN: 8 mg/dL (ref 8–27)
CHLORIDE: 94 mmol/L — AB (ref 96–106)
CO2: 30 mmol/L — ABNORMAL HIGH (ref 20–29)
Calcium: 10.1 mg/dL (ref 8.7–10.3)
Creatinine, Ser: 0.43 mg/dL — ABNORMAL LOW (ref 0.57–1.00)
GFR calc Af Amer: 123 mL/min/{1.73_m2} (ref >59)
GFR, EST NON AFRICAN AMERICAN: 107 mL/min/{1.73_m2} (ref >59)
GLUCOSE: 85 mg/dL (ref 65–99)
POTASSIUM: 4.5 mmol/L (ref 3.5–5.2)
Sodium: 142 mmol/L (ref 134–144)

## 2016-09-09 NOTE — Progress Notes (Signed)
Internal Medicine Clinic Attending  Case discussed with Dr. Amin at the time of the visit.  We reviewed the resident's history and exam and pertinent patient test results.  I agree with the assessment, diagnosis, and plan of care documented in the resident's note.    

## 2016-09-12 ENCOUNTER — Telehealth: Payer: Self-pay

## 2016-09-12 NOTE — Telephone Encounter (Signed)
I would be happy to sign it since I took care of her in the hospital.  I checked Dr Chundi's box and did not see any orders to sign, what do I need to do.

## 2016-09-13 ENCOUNTER — Telehealth: Payer: Self-pay

## 2016-09-13 NOTE — Telephone Encounter (Signed)
Lattie Haw with [facility] requesting VO. Please call back.

## 2016-09-15 NOTE — Telephone Encounter (Signed)
Nicolette with [facility] requesitng VO. Please call pt back.

## 2016-09-16 NOTE — Telephone Encounter (Signed)
VO given for HHN, PT and OT for medication management, disease management, wound care to L hip, clean with saline and pat dry foam drsg to wound, change mon, wed and Friday.do you agree?

## 2016-09-16 NOTE — Telephone Encounter (Signed)
I agree. Thank you.

## 2016-09-16 NOTE — Telephone Encounter (Signed)
Agree with VO

## 2016-09-22 ENCOUNTER — Telehealth: Payer: Self-pay | Admitting: Internal Medicine

## 2016-09-22 ENCOUNTER — Encounter: Payer: Self-pay | Admitting: *Deleted

## 2016-09-22 NOTE — Telephone Encounter (Signed)
April (physical therapist)  from [facility] requesting verbal orders.  Please call back.

## 2016-09-22 NOTE — Telephone Encounter (Signed)
Have tried to rtc, no answer, no vmail at [phone]

## 2016-09-23 ENCOUNTER — Telehealth: Payer: Self-pay

## 2016-09-23 NOTE — Telephone Encounter (Signed)
April with [facility] requesting VO. Please call back.

## 2016-09-23 NOTE — Telephone Encounter (Signed)
HH PT calls for VO for 1x week for 1 week 2xweek for 5 weeks For balance, endurance, gait, safety VO given, do you agree?

## 2016-09-23 NOTE — Telephone Encounter (Signed)
Lm for rtc 

## 2016-09-27 ENCOUNTER — Telehealth: Payer: Self-pay

## 2016-09-27 ENCOUNTER — Ambulatory Visit (INDEPENDENT_AMBULATORY_CARE_PROVIDER_SITE_OTHER): Payer: Medicare Other | Admitting: Internal Medicine

## 2016-09-27 VITALS — BP 133/60 | HR 80 | Temp 98.6°F | Wt 78.6 lb

## 2016-09-27 DIAGNOSIS — G8921 Chronic pain due to trauma: Secondary | ICD-10-CM | POA: Diagnosis not present

## 2016-09-27 DIAGNOSIS — G8929 Other chronic pain: Secondary | ICD-10-CM

## 2016-09-27 DIAGNOSIS — Z8781 Personal history of (healed) traumatic fracture: Secondary | ICD-10-CM | POA: Diagnosis not present

## 2016-09-27 DIAGNOSIS — Z026 Encounter for examination for insurance purposes: Secondary | ICD-10-CM | POA: Diagnosis not present

## 2016-09-27 DIAGNOSIS — L89222 Pressure ulcer of left hip, stage 2: Secondary | ICD-10-CM | POA: Diagnosis not present

## 2016-09-27 DIAGNOSIS — M549 Dorsalgia, unspecified: Secondary | ICD-10-CM

## 2016-09-27 DIAGNOSIS — F17210 Nicotine dependence, cigarettes, uncomplicated: Secondary | ICD-10-CM | POA: Diagnosis not present

## 2016-09-27 DIAGNOSIS — M25559 Pain in unspecified hip: Secondary | ICD-10-CM | POA: Diagnosis not present

## 2016-09-27 DIAGNOSIS — L89322 Pressure ulcer of left buttock, stage 2: Secondary | ICD-10-CM

## 2016-09-27 DIAGNOSIS — J439 Emphysema, unspecified: Secondary | ICD-10-CM

## 2016-09-27 DIAGNOSIS — Z9981 Dependence on supplemental oxygen: Secondary | ICD-10-CM | POA: Diagnosis not present

## 2016-09-27 DIAGNOSIS — Z0000 Encounter for general adult medical examination without abnormal findings: Secondary | ICD-10-CM

## 2016-09-27 NOTE — Assessment & Plan Note (Signed)
See HPI for specifics regarding her COPD and the utility of a hospital bed with regard to this problem.

## 2016-09-27 NOTE — Assessment & Plan Note (Addendum)
See HPI for specifics regarding her back and msk pain and the utility of a hospital bed with regard to this problem.

## 2016-09-27 NOTE — Patient Instructions (Addendum)
Thank you for coming in today [name].   We've completed your assessment for a hospital bed and the note from the visit today can be evaluated by Medicare to get the process started. I apologize for the frustration with getting help at home and hope that some of the resources [name] shared and this visit can help keep things moving. We also checked some lab work with you and can fax results. Call the clinic if there is any delay with this.

## 2016-09-27 NOTE — Telephone Encounter (Signed)
[facility] nurse reports patient is febrile 100.7 coughing up thick green sputum diminished breath sounds left lobe, wheezes right lobe 02  Sats 96% on 3 lpm/ Patient does appear to be having difficulty breathing but reports she is SOB. Per Dr. Lynnae January patient is to go to ED if symptoms worsen and she can come back to [facility] tomorrow for re check if she needs to. gave patient Dr. Zenovia Jarred instructions she verbalized understanding to go to Ed if she gets worse if not she can call for appointment in the am if she needs to

## 2016-09-27 NOTE — Progress Notes (Signed)
   CC: Hospital bed evaluation   HPI:  [name] is a 65 y.o. F with past medical history detailed below who presents to the clinic for evaluation for a hospital bed.   [name] suffers from chronic obstructive pulmonary disease and has trouble breathing at night when head is elevated less than 45-50 degrees. Bed wedges do not provide enough elevation to resolve breathing issues. Shortness of breath causes the patient to require frequent changes in body position which cannot be achieved with a normal bed.   She also suffers from chronic back and hip pain which is caused by degenerative changes and history of intertrochanteric fracture. Hospital bed will alleviate pain by allowing her hip and back to be positioned in ways not feasible with a normal bed. Pain episodes frequenty require frequent changes in body position which cannot be achieved with a normal bed.   She also has a stage 2 pressure ulcer of her left hip/buttock which requires her hips to be positioned in ways not feasible with a normal bed. Head must be elevated 45-50 and position changed or there is increased pressure put onto the wound which interferes with healing.     Past Medical History:  Diagnosis Date  . Anemia   . Anxiety   . Arthritis    "all in my body, primarily in my hands" (08/31/2016)  . Bipolar disorder ([place])   . Chronic lower back pain   . Chronic pain syndrome    [facility]s [facility]  . COPD (chronic obstructive pulmonary disease) (HCC)    oxygen dependent (3L home continuous)  . DEFICIENCY, VITAMIN D NOS 01/03/2007  . Depression   . Duodenal ulcer, chronic   . Elevated liver function tests   . HCAP (healthcare-associated pneumonia) 08/31/2016  . History of blood transfusion 03/2014; 10/2014   "low HgB" (11/11/2014)  . Hyperthyroidism    "borderline"  . On home oxygen therapy    "3L; 24/7" (08/31/2016)  . OSTEOPOROSIS 06/17/2009   DEXA 05/2009 : L femur -2.9; R femur -2.5. Alendronate  on med list but not taking. Needs addressed ASAP as h/o fractures.    . Pneumonia X 1?  . Sciatic pain   . Shortness of breath    "all the time" (02/27/2013)  . Substance abuse     narcotics, alcohol, tobacco  . Suicidal ideation 2007    attempted overdose 2012  Dr. Tivis Ringer report   Review of Systems:  Review of Systems  Constitutional: Negative for chills and fever.  Respiratory: Positive for cough (chronic, baseline).   Cardiovascular: Negative for chest pain.     Physical Exam:  Vitals:   09/27/16 1100  BP: 133/60  Pulse: 80  Temp: 98.6 F (37 C)  TempSrc: Oral  SpO2: 99%  Weight: 78 lb 9.6 oz (35.7 kg)   Physical Exam  Constitutional: She is oriented to person, place, and time.  Elderly, frail female on oxygen, no distress   HENT:  Mouth/Throat: Oropharynx is clear and moist.  Cardiovascular: Normal rate and regular rhythm.   Pulmonary/Chest:  Decreased breath sounds, increased AP diameter of the chest, recruitment of accessory muscles when speaking   Neurological: She is alert and oriented to person, place, and time.  Skin: Capillary refill takes less than 2 seconds.    Assessment & Plan:   See Encounters Tab for problem based charting.  Patient seen with Dr. Lynnae January

## 2016-09-27 NOTE — Assessment & Plan Note (Signed)
See HPI for specifics regarding her pressure ulcer and the utility of a hospital bed with regard to this problem.

## 2016-09-27 NOTE — Telephone Encounter (Signed)
agree

## 2016-09-27 NOTE — Assessment & Plan Note (Signed)
[name] has multiple chronic medical issues which would benefit from a hospital bed, as detailed above. In addition, she is very frail and a hospital bed would likely decrease the chance of further pathologic fractures or adverse events given the greater ease of use and safety it would offer. At f/u with her PCP, she expresses a willingness to engage in advanced directives conversations, though she briefly mentions she wishes to live independently for as long as possible.

## 2016-09-28 LAB — CBC WITH DIFFERENTIAL/PLATELET
BASOS: 0 %
Basophils Absolute: 0 10*3/uL (ref 0.0–0.2)
EOS (ABSOLUTE): 0.1 10*3/uL (ref 0.0–0.4)
EOS: 1 %
HEMATOCRIT: 32.7 % — AB (ref 34.0–46.6)
Hemoglobin: 9.8 g/dL — ABNORMAL LOW (ref 11.1–15.9)
IMMATURE GRANS (ABS): 0 10*3/uL (ref 0.0–0.1)
Immature Granulocytes: 0 %
LYMPHS: 20 %
Lymphocytes Absolute: 1.6 10*3/uL (ref 0.7–3.1)
MCH: 24.7 pg — ABNORMAL LOW (ref 26.6–33.0)
MCHC: 30 g/dL — ABNORMAL LOW (ref 31.5–35.7)
MCV: 83 fL (ref 79–97)
MONOCYTES: 8 %
Monocytes Absolute: 0.7 10*3/uL (ref 0.1–0.9)
NEUTROS PCT: 71 %
Neutrophils Absolute: 5.7 10*3/uL (ref 1.4–7.0)
Platelets: 579 10*3/uL — ABNORMAL HIGH (ref 150–379)
RBC: 3.96 x10E6/uL (ref 3.77–5.28)
RDW: 14.6 % (ref 12.3–15.4)
WBC: 8.1 10*3/uL (ref 3.4–10.8)

## 2016-09-28 LAB — LIPID PANEL
CHOL/HDL RATIO: 2.4 ratio (ref 0.0–4.4)
Cholesterol, Total: 182 mg/dL (ref 100–199)
HDL: 77 mg/dL (ref >39)
LDL Calculated: 91 mg/dL (ref 0–99)
TRIGLYCERIDES: 68 mg/dL (ref 0–149)
VLDL CHOLESTEROL CAL: 14 mg/dL (ref 5–40)

## 2016-09-28 LAB — CMP14 + ANION GAP
ALT: 9 IU/L (ref 0–32)
AST: 13 IU/L (ref 0–40)
Albumin/Globulin Ratio: 1.2 (ref 1.2–2.2)
Albumin: 3.5 g/dL — ABNORMAL LOW (ref 3.6–4.8)
Alkaline Phosphatase: 155 IU/L — ABNORMAL HIGH (ref 39–117)
Anion Gap: 16 mmol/L (ref 10.0–18.0)
BUN/Creatinine Ratio: 11 — ABNORMAL LOW (ref 12–28)
BUN: 5 mg/dL — AB (ref 8–27)
CALCIUM: 11.4 mg/dL — AB (ref 8.7–10.3)
CHLORIDE: 92 mmol/L — AB (ref 96–106)
CO2: 34 mmol/L — ABNORMAL HIGH (ref 20–29)
Creatinine, Ser: 0.47 mg/dL — ABNORMAL LOW (ref 0.57–1.00)
GFR calc Af Amer: 120 mL/min/{1.73_m2} (ref >59)
GFR, EST NON AFRICAN AMERICAN: 104 mL/min/{1.73_m2} (ref >59)
GLOBULIN, TOTAL: 2.9 g/dL (ref 1.5–4.5)
GLUCOSE: 73 mg/dL (ref 65–99)
Potassium: 3.5 mmol/L (ref 3.5–5.2)
SODIUM: 142 mmol/L (ref 134–144)
Total Protein: 6.4 g/dL (ref 6.0–8.5)

## 2016-09-28 LAB — TSH: TSH: 2.16 u[IU]/mL (ref 0.450–4.500)

## 2016-09-28 LAB — HEMOGLOBIN A1C
Est. average glucose Bld gHb Est-mCnc: 126 mg/dL
Hgb A1c MFr Bld: 6 % — ABNORMAL HIGH (ref 4.8–5.6)

## 2016-09-28 NOTE — Telephone Encounter (Signed)
Has had appt since this time

## 2016-09-28 NOTE — Progress Notes (Signed)
Internal Medicine Clinic Attending  I saw and evaluated the patient.  I personally confirmed the key portions of the history and exam documented by Dr. Harden and I reviewed pertinent patient test results.  The assessment, diagnosis, and plan were formulated together and I agree with the documentation in the resident's note.  

## 2016-10-05 ENCOUNTER — Telehealth: Payer: Self-pay

## 2016-10-05 NOTE — Telephone Encounter (Signed)
[facility] RN calls about pt's wound care, she states the wound has been "stagnant" for appr 2 weeks, would like to switch wound care to hydrosera and foam drsg x 3days wk do you approve?

## 2016-10-05 NOTE — Telephone Encounter (Signed)
Elmyra Ricks with [facility] want to notify of missed visit for PT on 10/04/2016.

## 2016-10-05 NOTE — Telephone Encounter (Signed)
Yes, approve change

## 2016-10-05 NOTE — Telephone Encounter (Signed)
noted 

## 2016-10-07 NOTE — Addendum Note (Signed)
Addended by: Tawny Asal A on: 10/07/2016 11:38 AM   Modules accepted: Orders

## 2016-10-14 ENCOUNTER — Telehealth: Payer: Self-pay | Admitting: *Deleted

## 2016-10-14 ENCOUNTER — Encounter: Payer: Self-pay | Admitting: *Deleted

## 2016-10-14 ENCOUNTER — Telehealth: Payer: Self-pay | Admitting: Internal Medicine

## 2016-10-14 NOTE — Telephone Encounter (Signed)
Home health nurse states patient has been c/o throat pain x few weeks, difficulty eating. Still coughing up greenish brown mucus. Lungs clear, continues to use advair, albuterol nebs & continuous O2. Said patient is "just not herself". Requesting for appt for Monday as patient will not have enough time to be here today. Advised that patient needs to be seen in ED/urgent care & not wait for Monday's appt.

## 2016-10-14 NOTE — Telephone Encounter (Signed)
Agree with plan.  She is a very frail COPD patient and should be seen sooner rather than later.

## 2016-10-14 NOTE — Telephone Encounter (Signed)
Rec'd call from [place].  Refaxed DME ORDER with OV notes for Hospital Bed.  If patient has any questions please ask her to call [phone] and press 7 and then 6 for a Rep and they will assist the patient.

## 2016-10-17 ENCOUNTER — Emergency Department (HOSPITAL_COMMUNITY): Payer: Medicare Other

## 2016-10-17 ENCOUNTER — Ambulatory Visit: Payer: Self-pay

## 2016-10-17 ENCOUNTER — Telehealth: Payer: Self-pay

## 2016-10-17 ENCOUNTER — Encounter (HOSPITAL_COMMUNITY): Payer: Self-pay

## 2016-10-17 ENCOUNTER — Inpatient Hospital Stay (HOSPITAL_COMMUNITY)
Admission: EM | Admit: 2016-10-17 | Discharge: 2016-10-21 | DRG: 374 | Disposition: A | Discharge status: Hospice | Payer: Medicare Other | Attending: Internal Medicine | Admitting: Internal Medicine

## 2016-10-17 ENCOUNTER — Inpatient Hospital Stay (HOSPITAL_COMMUNITY): Payer: Medicare Other

## 2016-10-17 ENCOUNTER — Encounter: Payer: Self-pay | Admitting: Internal Medicine

## 2016-10-17 DIAGNOSIS — Z7952 Long term (current) use of systemic steroids: Secondary | ICD-10-CM

## 2016-10-17 DIAGNOSIS — Z8579 Personal history of other malignant neoplasms of lymphoid, hematopoietic and related tissues: Secondary | ICD-10-CM | POA: Diagnosis not present

## 2016-10-17 DIAGNOSIS — G894 Chronic pain syndrome: Secondary | ICD-10-CM | POA: Diagnosis present

## 2016-10-17 DIAGNOSIS — Z961 Presence of intraocular lens: Secondary | ICD-10-CM | POA: Diagnosis present

## 2016-10-17 DIAGNOSIS — Z9842 Cataract extraction status, left eye: Secondary | ICD-10-CM

## 2016-10-17 DIAGNOSIS — J189 Pneumonia, unspecified organism: Secondary | ICD-10-CM | POA: Diagnosis present

## 2016-10-17 DIAGNOSIS — Z681 Body mass index (BMI) 19 or less, adult: Secondary | ICD-10-CM | POA: Diagnosis not present

## 2016-10-17 DIAGNOSIS — Z91018 Allergy to other foods: Secondary | ICD-10-CM

## 2016-10-17 DIAGNOSIS — Z8711 Personal history of peptic ulcer disease: Secondary | ICD-10-CM

## 2016-10-17 DIAGNOSIS — E870 Hyperosmolality and hypernatremia: Secondary | ICD-10-CM | POA: Diagnosis present

## 2016-10-17 DIAGNOSIS — C159 Malignant neoplasm of esophagus, unspecified: Principal | ICD-10-CM | POA: Diagnosis present

## 2016-10-17 DIAGNOSIS — C779 Secondary and unspecified malignant neoplasm of lymph node, unspecified: Secondary | ICD-10-CM | POA: Diagnosis present

## 2016-10-17 DIAGNOSIS — K219 Gastro-esophageal reflux disease without esophagitis: Secondary | ICD-10-CM | POA: Diagnosis present

## 2016-10-17 DIAGNOSIS — Z888 Allergy status to other drugs, medicaments and biological substances status: Secondary | ICD-10-CM | POA: Diagnosis not present

## 2016-10-17 DIAGNOSIS — J029 Acute pharyngitis, unspecified: Secondary | ICD-10-CM | POA: Diagnosis present

## 2016-10-17 DIAGNOSIS — F319 Bipolar disorder, unspecified: Secondary | ICD-10-CM | POA: Diagnosis present

## 2016-10-17 DIAGNOSIS — M1990 Unspecified osteoarthritis, unspecified site: Secondary | ICD-10-CM | POA: Diagnosis present

## 2016-10-17 DIAGNOSIS — F17210 Nicotine dependence, cigarettes, uncomplicated: Secondary | ICD-10-CM | POA: Diagnosis present

## 2016-10-17 DIAGNOSIS — R1314 Dysphagia, pharyngoesophageal phase: Secondary | ICD-10-CM | POA: Diagnosis not present

## 2016-10-17 DIAGNOSIS — G8929 Other chronic pain: Secondary | ICD-10-CM | POA: Diagnosis present

## 2016-10-17 DIAGNOSIS — M810 Age-related osteoporosis without current pathological fracture: Secondary | ICD-10-CM | POA: Diagnosis present

## 2016-10-17 DIAGNOSIS — J439 Emphysema, unspecified: Secondary | ICD-10-CM | POA: Diagnosis present

## 2016-10-17 DIAGNOSIS — J040 Acute laryngitis: Secondary | ICD-10-CM | POA: Diagnosis present

## 2016-10-17 DIAGNOSIS — IMO0001 Reserved for inherently not codable concepts without codable children: Secondary | ICD-10-CM | POA: Diagnosis present

## 2016-10-17 DIAGNOSIS — Z91048 Other nonmedicinal substance allergy status: Secondary | ICD-10-CM

## 2016-10-17 DIAGNOSIS — L8990 Pressure ulcer of unspecified site, unspecified stage: Secondary | ICD-10-CM | POA: Diagnosis present

## 2016-10-17 DIAGNOSIS — E559 Vitamin D deficiency, unspecified: Secondary | ICD-10-CM | POA: Diagnosis present

## 2016-10-17 DIAGNOSIS — E43 Unspecified severe protein-calorie malnutrition: Secondary | ICD-10-CM | POA: Diagnosis present

## 2016-10-17 DIAGNOSIS — D509 Iron deficiency anemia, unspecified: Secondary | ICD-10-CM | POA: Diagnosis present

## 2016-10-17 DIAGNOSIS — E869 Volume depletion, unspecified: Secondary | ICD-10-CM | POA: Diagnosis present

## 2016-10-17 DIAGNOSIS — Z7951 Long term (current) use of inhaled steroids: Secondary | ICD-10-CM | POA: Diagnosis not present

## 2016-10-17 DIAGNOSIS — F3130 Bipolar disorder, current episode depressed, mild or moderate severity, unspecified: Secondary | ICD-10-CM | POA: Diagnosis present

## 2016-10-17 DIAGNOSIS — Z9981 Dependence on supplemental oxygen: Secondary | ICD-10-CM | POA: Diagnosis not present

## 2016-10-17 DIAGNOSIS — Z9841 Cataract extraction status, right eye: Secondary | ICD-10-CM

## 2016-10-17 DIAGNOSIS — M549 Dorsalgia, unspecified: Secondary | ICD-10-CM

## 2016-10-17 DIAGNOSIS — Z515 Encounter for palliative care: Secondary | ICD-10-CM | POA: Diagnosis not present

## 2016-10-17 DIAGNOSIS — J9621 Acute and chronic respiratory failure with hypoxia: Secondary | ICD-10-CM

## 2016-10-17 DIAGNOSIS — E874 Mixed disorder of acid-base balance: Secondary | ICD-10-CM | POA: Diagnosis present

## 2016-10-17 DIAGNOSIS — R599 Enlarged lymph nodes, unspecified: Secondary | ICD-10-CM

## 2016-10-17 DIAGNOSIS — Y95 Nosocomial condition: Secondary | ICD-10-CM | POA: Diagnosis present

## 2016-10-17 DIAGNOSIS — F17200 Nicotine dependence, unspecified, uncomplicated: Secondary | ICD-10-CM | POA: Diagnosis present

## 2016-10-17 DIAGNOSIS — R0602 Shortness of breath: Secondary | ICD-10-CM | POA: Diagnosis present

## 2016-10-17 DIAGNOSIS — J690 Pneumonitis due to inhalation of food and vomit: Secondary | ICD-10-CM | POA: Diagnosis present

## 2016-10-17 DIAGNOSIS — J060 Acute laryngopharyngitis: Secondary | ICD-10-CM | POA: Diagnosis present

## 2016-10-17 DIAGNOSIS — R1310 Dysphagia, unspecified: Secondary | ICD-10-CM

## 2016-10-17 DIAGNOSIS — E876 Hypokalemia: Secondary | ICD-10-CM | POA: Diagnosis not present

## 2016-10-17 DIAGNOSIS — R64 Cachexia: Secondary | ICD-10-CM | POA: Diagnosis present

## 2016-10-17 DIAGNOSIS — Z66 Do not resuscitate: Secondary | ICD-10-CM | POA: Diagnosis present

## 2016-10-17 LAB — BASIC METABOLIC PANEL
Anion gap: 12 (ref 5–15)
BUN: 17 mg/dL (ref 6–20)
CO2: 46 mmol/L — ABNORMAL HIGH (ref 22–32)
Calcium: 14 mg/dL (ref 8.9–10.3)
Chloride: 91 mmol/L — ABNORMAL LOW (ref 101–111)
Creatinine, Ser: 0.51 mg/dL (ref 0.44–1.00)
GFR calc Af Amer: >60 mL/min (ref >60)
GFR calc non Af Amer: >60 mL/min (ref >60)
Glucose, Bld: 92 mg/dL (ref 65–99)
Potassium: 2.6 mmol/L — CL (ref 3.5–5.1)
Sodium: 149 mmol/L — ABNORMAL HIGH (ref 135–145)

## 2016-10-17 LAB — CBC WITH DIFFERENTIAL/PLATELET
Basophils Absolute: 0 10*3/uL (ref 0.0–0.1)
Basophils Relative: 0 %
Eosinophils Absolute: 0 10*3/uL (ref 0.0–0.7)
Eosinophils Relative: 0 %
HCT: 39 % (ref 36.0–46.0)
Hemoglobin: 11.3 g/dL — ABNORMAL LOW (ref 12.0–15.0)
Lymphocytes Relative: 12 %
Lymphs Abs: 1.5 10*3/uL (ref 0.7–4.0)
MCH: 24.6 pg — ABNORMAL LOW (ref 26.0–34.0)
MCHC: 29 g/dL — ABNORMAL LOW (ref 30.0–36.0)
MCV: 85 fL (ref 78.0–100.0)
Monocytes Absolute: 0.8 10*3/uL (ref 0.1–1.0)
Monocytes Relative: 6 %
Neutro Abs: 10.4 10*3/uL — ABNORMAL HIGH (ref 1.7–7.7)
Neutrophils Relative %: 82 %
Platelets: 641 10*3/uL — ABNORMAL HIGH (ref 150–400)
RBC: 4.59 MIL/uL (ref 3.87–5.11)
RDW: 15.2 % (ref 11.5–15.5)
WBC: 12.7 10*3/uL — ABNORMAL HIGH (ref 4.0–10.5)

## 2016-10-17 LAB — I-STAT TROPONIN, ED: TROPONIN I, POC: 0 ng/mL (ref 0.00–0.08)

## 2016-10-17 LAB — I-STAT VENOUS BLOOD GAS, ED
Acid-Base Excess: 26 mmol/L — ABNORMAL HIGH (ref 0.0–2.0)
Bicarbonate: 53.4 mmol/L — ABNORMAL HIGH (ref 20.0–28.0)
O2 SAT: 89 %
PCO2 VEN: 65.4 mmHg — AB (ref 44.0–60.0)
TCO2: >50 mmol/L (ref 0–100)
pH, Ven: 7.52 — ABNORMAL HIGH (ref 7.250–7.430)
pO2, Ven: 54 mmHg — ABNORMAL HIGH (ref 32.0–45.0)

## 2016-10-17 LAB — RAPID STREP SCREEN (MED CTR MEBANE ONLY): STREPTOCOCCUS, GROUP A SCREEN (DIRECT): NEGATIVE

## 2016-10-17 MED ORDER — SODIUM CHLORIDE 0.9 % IV BOLUS (SEPSIS)
500.0000 mL | Freq: Once | INTRAVENOUS | Status: AC
  Administered 2016-10-17: 500 mL via INTRAVENOUS

## 2016-10-17 MED ORDER — CALCITONIN (SALMON) 200 UNIT/ML IJ SOLN
4.0000 [IU]/kg | Freq: Two times a day (BID) | INTRAMUSCULAR | Status: DC
  Administered 2016-10-18 (×3): 142 [IU] via SUBCUTANEOUS
  Filled 2016-10-17 (×5): qty 0.71

## 2016-10-17 MED ORDER — POTASSIUM CHLORIDE 10 MEQ/100ML IV SOLN
10.0000 meq | INTRAVENOUS | Status: AC
  Administered 2016-10-17 – 2016-10-18 (×4): 10 meq via INTRAVENOUS
  Filled 2016-10-17 (×4): qty 100

## 2016-10-17 MED ORDER — ALBUTEROL SULFATE (2.5 MG/3ML) 0.083% IN NEBU
5.0000 mg | INHALATION_SOLUTION | Freq: Once | RESPIRATORY_TRACT | Status: AC
  Administered 2016-10-17: 5 mg via RESPIRATORY_TRACT
  Filled 2016-10-17 (×2): qty 6

## 2016-10-17 MED ORDER — METHYLPREDNISOLONE SODIUM SUCC 125 MG IJ SOLR
125.0000 mg | Freq: Once | INTRAMUSCULAR | Status: AC
  Administered 2016-10-17: 125 mg via INTRAVENOUS
  Filled 2016-10-17: qty 2

## 2016-10-17 MED ORDER — VANCOMYCIN HCL IN DEXTROSE 750-5 MG/150ML-% IV SOLN
750.0000 mg | Freq: Once | INTRAVENOUS | Status: AC
  Administered 2016-10-17: 750 mg via INTRAVENOUS
  Filled 2016-10-17: qty 150

## 2016-10-17 MED ORDER — ONDANSETRON HCL 4 MG/2ML IJ SOLN
4.0000 mg | Freq: Four times a day (QID) | INTRAMUSCULAR | Status: DC | PRN
Start: 2016-10-17 — End: 2016-10-20

## 2016-10-17 MED ORDER — WHITE PETROLATUM GEL
1.0000 "application " | Freq: Every day | Status: DC
  Administered 2016-10-18 – 2016-10-21 (×4): 1 via TOPICAL
  Filled 2016-10-17 (×4): qty 1

## 2016-10-17 MED ORDER — ZOLEDRONIC ACID 4 MG/5ML IV CONC
3.0000 mg | Freq: Once | INTRAVENOUS | Status: AC
  Administered 2016-10-18: 3 mg via INTRAVENOUS
  Filled 2016-10-17: qty 3.75

## 2016-10-17 MED ORDER — GI COCKTAIL ~~LOC~~
30.0000 mL | Freq: Once | ORAL | Status: DC
  Filled 2016-10-17: qty 30

## 2016-10-17 MED ORDER — MORPHINE SULFATE (PF) 4 MG/ML IV SOLN
4.0000 mg | INTRAVENOUS | Status: DC | PRN
  Administered 2016-10-17 – 2016-10-20 (×6): 4 mg via INTRAVENOUS
  Filled 2016-10-17 (×6): qty 1

## 2016-10-17 MED ORDER — IPRATROPIUM-ALBUTEROL 0.5-2.5 (3) MG/3ML IN SOLN
3.0000 mL | Freq: Once | RESPIRATORY_TRACT | Status: AC
  Administered 2016-10-17: 3 mL via RESPIRATORY_TRACT
  Filled 2016-10-17: qty 3

## 2016-10-17 MED ORDER — ONDANSETRON HCL 4 MG PO TABS: 4.0000 mg | ORAL_TABLET | Freq: Four times a day (QID) | ORAL | Status: DC | PRN

## 2016-10-17 MED ORDER — MOMETASONE FURO-FORMOTEROL FUM 200-5 MCG/ACT IN AERO
2.0000 | INHALATION_SPRAY | Freq: Two times a day (BID) | RESPIRATORY_TRACT | Status: DC
  Administered 2016-10-18 – 2016-10-20 (×4): 2 via RESPIRATORY_TRACT
  Filled 2016-10-17: qty 8.8

## 2016-10-17 MED ORDER — ALBUTEROL SULFATE (2.5 MG/3ML) 0.083% IN NEBU: 2.5000 mg | INHALATION_SOLUTION | RESPIRATORY_TRACT | Status: DC | PRN

## 2016-10-17 MED ORDER — PANTOPRAZOLE SODIUM 40 MG IV SOLR
40.0000 mg | Freq: Every day | INTRAVENOUS | Status: DC
  Administered 2016-10-18 – 2016-10-19 (×3): 40 mg via INTRAVENOUS
  Filled 2016-10-17 (×3): qty 40

## 2016-10-17 MED ORDER — DEXTROSE 5 % IV SOLN
1.0000 g | Freq: Once | INTRAVENOUS | Status: AC
  Administered 2016-10-17: 1 g via INTRAVENOUS
  Filled 2016-10-17 (×2): qty 1

## 2016-10-17 MED ORDER — POTASSIUM CHLORIDE IN NACL 20-0.9 MEQ/L-% IV SOLN
INTRAVENOUS | Status: DC
  Administered 2016-10-18: 04:00:00 via INTRAVENOUS
  Filled 2016-10-17 (×2): qty 1000

## 2016-10-17 MED ORDER — IOPAMIDOL (ISOVUE-300) INJECTION 61%
INTRAVENOUS | Status: AC
  Administered 2016-10-17: 75 mL via INTRAVENOUS
  Filled 2016-10-17: qty 100

## 2016-10-17 MED ORDER — FLUTICASONE PROPIONATE 50 MCG/ACT NA SUSP
2.0000 | Freq: Every day | NASAL | Status: DC
  Administered 2016-10-18 – 2016-10-19 (×2): 2 via NASAL
  Filled 2016-10-17: qty 16

## 2016-10-17 MED ORDER — IOPAMIDOL (ISOVUE-300) INJECTION 61%
INTRAVENOUS | Status: AC
  Filled 2016-10-17: qty 75

## 2016-10-17 MED ORDER — VANCOMYCIN HCL 500 MG IV SOLR
500.0000 mg | INTRAVENOUS | Status: DC
  Filled 2016-10-17: qty 500

## 2016-10-17 MED ORDER — IPRATROPIUM-ALBUTEROL 0.5-2.5 (3) MG/3ML IN SOLN: 3.0000 mL | Freq: Four times a day (QID) | RESPIRATORY_TRACT | Status: DC

## 2016-10-17 MED ORDER — ENOXAPARIN SODIUM 30 MG/0.3ML ~~LOC~~ SOLN
30.0000 mg | Freq: Every day | SUBCUTANEOUS | Status: DC
  Administered 2016-10-18: 30 mg via SUBCUTANEOUS
  Filled 2016-10-17 (×3): qty 0.3

## 2016-10-17 NOTE — ED Provider Notes (Signed)
[place] DEPT Provider Note   CSN: [id number] Arrival date & time: 10/17/16  1435  By signing my name below, I, Ephriam Jenkins, attest that this documentation has been prepared under the direction and in the presence of [facility] PA-C.  Electronically Signed: Ephriam Jenkins, ED Scribe. 10/17/16. 4:10 PM.  History   Chief Complaint Chief Complaint  Patient presents with  . Sore Throat    HPI HPI Comments: [name] is a 66 y.o. female with history of COPD, chronic pain syndrome, osteoporosis, bipolar 1 disorder and history of HCAPwho presents to the Emergency Department complaining of Gradual onset, progressively worsening sore throat, odynophagia, with associated productive cough, nasal congestion, headaches for past two weeks. She has tried using Hall's lozenges and gargling with salt water with little to no relief. She states she has not had much of anything to eat or drink since the day before yesterday due to pain on swallowing. She states she has mild achy generalized abdominal pain since not eating. She states she has a cough with yellow sputum and occasional green/brown sputum production that started more than two weeks ago. No drooling or facial swelling. She denies worsening shortness of breath with me however complains of shortness of breath with nurse and O2 saturations dropped to 88% while on 3 L nasal cannula when attempting to ambulate from wheelchair to hospital chair. She denies chest pain, nausea, vomiting, or diarrhea. She lives at home alone. Of note, patient was recently discharged from the hospital in June for healthcare associated pneumonia and COPD exacerbation.  The history is provided by the patient. No language interpreter was used.    Past Medical History:  Diagnosis Date  . Anemia   . Anxiety   . Arthritis    "all in my body, primarily in my hands" (08/31/2016)  . Bipolar disorder ([place])   . Chronic lower back pain   . Chronic pain syndrome    follows at  [facility] pain managment  . COPD (chronic obstructive pulmonary disease) (HCC)    oxygen dependent (3L home continuous)  . DEFICIENCY, VITAMIN D NOS 01/03/2007  . Depression   . Duodenal ulcer, chronic   . Elevated liver function tests   . HCAP (healthcare-associated pneumonia) 08/31/2016  . History of blood transfusion 03/2014; 10/2014   "low HgB" (11/11/2014)  . Hyperthyroidism    "borderline"  . On home oxygen therapy    "3L; 24/7" (08/31/2016)  . OSTEOPOROSIS 06/17/2009   DEXA 05/2009 : L femur -2.9; R femur -2.5. Alendronate on med list but not taking. Needs addressed ASAP as h/o fractures.    . Pneumonia X 1?  . Sciatic pain   . Shortness of breath    "all the time" (02/27/2013)  . Substance abuse     narcotics, alcohol, tobacco  . Suicidal ideation 2007    attempted overdose 2012  Dr. Tivis Ringer report    Patient Active Problem List   Diagnosis Date Noted  . Sore throat and laryngitis 09/08/2016  . Venous stasis 09/08/2016  . Hypokalemia 09/01/2016  . Pressure injury of skin 09/01/2016  . Pneumonia 08/31/2016  . Pressure ulcer 06/28/2016  . Right thyroid nodule 06/13/2016  . C3 cervical fracture ([place]) 06/11/2016  . Closed displaced intertrochanteric fracture of left femur ([place]) 06/09/2016  . Wrist drop, left 03/10/2016  . Constipation   . Orthostatic hypotension 11/11/2014  . Duodenal ulcer   . Polypharmacy 10/24/2014  . Smoking 10/24/2014  . Trochanteric bursitis of left hip 10/01/2014  .  Rash and nonspecific skin eruption 09/17/2014  . Immunization, tetanus-diphtheria 09/05/2014  . Iron deficiency anemia 11/12/2013  . Health care maintenance 05/14/2013  . Nausea and vomiting 04/17/2013  . Insomnia 03/06/2013  . GERD (gastroesophageal reflux disease) 03/06/2013  . Protein-calorie malnutrition, severe ([place]) 03/04/2013  . Chronic pain 03/02/2013  . Dyslipidemia 02/26/2013  . Seasonal allergies 07/09/2012  . COPD (chronic obstructive pulmonary disease) with emphysema  ([place]) 03/01/2011  . Osteoporosis 06/17/2009  . Bipolar I disorder, most recent episode depressed ([place]) 06/02/2009  . Vitamin D deficiency 01/03/2007  . Chronic back pain 12/07/2005    Past Surgical History:  Procedure Laterality Date  . ANKLE DEBRIDEMENT Left 10/2010  . AUGMENTATION MAMMAPLASTY Bilateral ~ 2007  . CATARACT EXTRACTION W/ INTRAOCULAR LENS  IMPLANT, BILATERAL Bilateral   . CESAREAN SECTION  1974; 1979  . ESOPHAGOGASTRODUODENOSCOPY N/A 11/07/2014   Procedure: ESOPHAGOGASTRODUODENOSCOPY (EGD);  Surgeon: Irene Shipper, MD;  [facility] ENDOSCOPY;  Service: Endoscopy;  Laterality: N/A;  . FRACTURE SURGERY    . INTRAMEDULLARY (IM) NAIL INTERTROCHANTERIC Left 06/10/2016   Procedure: LEFT FEMUR INTRAMEDULLARY (IM) NAIL INTERTROCHANTRIC;  Surgeon: Leandrew Koyanagi, MD;  Location: [place];  Service: Orthopedics;  Laterality: Left;  . ORIF ANKLE FRACTURE Left 10/2010  . TUBAL LIGATION  1979    OB History    No data available       Home Medications    Prior to Admission medications   Medication Sig Start Date End Date Taking? Authorizing Provider  albuterol (PROAIR HFA) 108 (90 Base) MCG/ACT inhaler INHALE ONE PUFF BY MOUTH EVERY 2 TO 4 HOURS AS NEEDED FOR WHEEZING 08/16/16   Rice, Resa Miner, MD  alendronate (FOSAMAX) 10 MG tablet Take 1 tablet (10 mg total) by mouth daily before breakfast. Take with a full glass of water on an empty stomach. 09/08/16   Lorella Nimrod, MD  azithromycin (ZITHROMAX) 500 MG tablet Take 1 tablet (500 mg total) by mouth daily. 09/04/16   Jule Ser, DO  Cholecalciferol 1000 units TBDP Take 1,000 mg by mouth daily. 09/08/16   Lorella Nimrod, MD  fluticasone (FLONASE) 50 MCG/ACT nasal spray Place 2 sprays into both nostrils daily.    [provider]  Fluticasone-Salmeterol (ADVAIR DISKUS) 250-50 MCG/DOSE AEPB INHALE 1 PUFF INTO THE LUNGS 2 TIMES DAILY 03/19/14   Jones Bales, MD  gabapentin (NEURONTIN) 300 MG capsule Take 300 mg by mouth at  bedtime.  06/25/14   [provider]  HYDROcodone-acetaminophen (NORCO/VICODIN) 5-325 MG tablet Take 1-2 tablets by mouth every 4 (four) hours as needed for moderate pain. 06/13/16   Asencion Partridge, MD  ipratropium-albuterol (DUONEB) 0.5-2.5 (3) MG/3ML SOLN 4 times a day (breakfast, lunch, dinner, bedtime).Can take 2 additional treatments if needed on bad days. DX J43.8 Patient taking differently: Take 3 mLs by nebulization 4 (four) times daily. (breakfast, lunch, dinner, bedtime).Can take 2 additional treatments if needed on bad days. DX J43.8 06/27/16   Maryellen Pile, MD  lamoTRIgine (LAMICTAL) 100 MG tablet TAKE ONE TABLET EVERY DAY 09/23/14   Sid Falcon, MD  levocetirizine (XYZAL) 5 MG tablet Take 1 tablet (5 mg total) by mouth at bedtime. 09/08/16   Lorella Nimrod, MD  Melatonin 10 MG TABS Take 1 tablet by mouth at bedtime as needed. 09/08/16   Lorella Nimrod, MD  OLANZapine (ZYPREXA) 2.5 MG tablet Take 2.5 mg by mouth at bedtime.    [provider]  ondansetron (ZOFRAN) 4 MG tablet Take 1 tablet (4 mg total) by  mouth every 8 (eight) hours as needed for nausea or vomiting. 06/27/16   Maryellen Pile, MD  oxyCODONE (OXY IR/ROXICODONE) 5 MG immediate release tablet Take 5-10 mg by mouth See admin instructions. Take 1 tablet for moderate pain or 2 tablets for severe pain every 4 hours for post surgical pain.    [provider]  OXYGEN Inhale 3 L into the lungs continuous.    [provider]  pantoprazole (PROTONIX) 40 MG tablet Take 1 tablet (40 mg total) by mouth daily. 09/08/16 09/08/17  Lorella Nimrod, MD  polyethylene glycol powder (GLYCOLAX/MIRALAX) powder TAKE 17G DISSOLVED IN 8OZ WATER DAILY ASNEEDED FOR MODERATE CONSTIPATION 09/23/14   Sid Falcon, MD  predniSONE (DELTASONE) 20 MG tablet Take 2 tablets (40 mg total) by mouth daily with breakfast. 09/04/16   Jule Ser, DO  venlafaxine XR (EFFEXOR-XR) 37.5 MG 24 hr capsule Take 37.5 mg by mouth every  evening. 10/24/15   [provider]  white petrolatum (VASELINE) GEL Apply 1 application topically daily. To both feet for dry skin    [provider]    Family History Family History  Problem Relation Age of Onset  . Myelodysplastic syndrome Father        Died from myelofibrosis though diagnosed post-mortem  . Stroke Neg Hx   . Cancer Neg Hx     Social History Social History  Substance Use Topics  . Smoking status: Current Every Day Smoker    Packs/day: 0.10    Years: 48.00    Types: Cigarettes  . Smokeless tobacco: Never Used     Comment: Patient doesn't want to quit  . Alcohol use No     Comment: 08/31/2016  "nothing in 10 years; never had a problem w/it"    Allergies   Banana; Lyrica [pregabalin]; and Pollen extract   Review of Systems Review of Systems  Constitutional: Negative for chills and fever.  HENT: Positive for congestion, rhinorrhea, sore throat, trouble swallowing and voice change. Negative for drooling and ear pain.   Respiratory: Positive for cough. Negative for shortness of breath.   Cardiovascular: Negative for chest pain.  Gastrointestinal: Positive for abdominal pain. Negative for diarrhea, nausea and vomiting.  Neurological: Positive for headaches.  All other systems reviewed and are negative.    Physical Exam Updated Vital Signs BP (!) 167/74   Pulse 83   Temp 98.3 F (36.8 C) (Oral)   Resp 20   SpO2 96%   Physical Exam  Constitutional: She appears well-developed and well-nourished. No distress.  Chronically ill-appearing, appears very fatigued  HENT:  Head: Normocephalic and atraumatic.  Right Ear: External ear normal.  Left Ear: External ear normal.  No tenderness to palpation of the maxillary or frontal sinuses. TMs normal bilaterally without erythema or bulging. Posterior oropharynx is clear without tonsillar hypertrophy, exudates, or uvular deviation. There is no trismus or sublingual abnormalities. There is mild  erythema to the posterior pharynx. Nasal septum is midline with pink mucosa. Speaks with a very soft voice/whispers due to pain on phonation.  Eyes: Pupils are equal, round, and reactive to light. Conjunctivae are normal. Right eye exhibits no discharge. Left eye exhibits no discharge.  Neck: No JVD present. No tracheal deviation present.  Cardiovascular: Normal rate, regular rhythm, normal heart sounds and intact distal pulses.   Pulmonary/Chest:  Globally hypoactive breath sounds. Speaks in short sentences, increased work of breathing noted. Equal rise and fall of chest. Persistent cough during examination. No cyanosis.  Abdominal: Soft. Bowel  sounds are normal. She exhibits no distension. There is no tenderness.  Musculoskeletal: She exhibits no edema.  Neurological: She is alert.  Fluent speech, no facial droop  Skin: Skin is warm and dry. Capillary refill takes less than 2 seconds. No erythema.  Psychiatric: She has a normal mood and affect. Her behavior is normal.  Nursing note and vitals reviewed.    ED Treatments / Results  DIAGNOSTIC STUDIES: Oxygen Saturation is 96% on RA, normal by my interpretation.  COORDINATION OF CARE: 4:07 PM-Discussed treatment plan with pt at bedside and pt agreed to plan.   Labs (all labs ordered are listed, but only abnormal results are displayed) Labs Reviewed  RAPID STREP SCREEN (NOT AT [facility])  CULTURE, GROUP A STREP ([facility])  CBC WITH DIFFERENTIAL/PLATELET  BASIC METABOLIC PANEL    EKG  EKG Interpretation None       Radiology No results found.  Procedures Procedures (including critical care time)  Medications Ordered in ED Medications  gi cocktail (Maalox,Lidocaine,Donnatal) (not administered)  ipratropium-albuterol (DUONEB) 0.5-2.5 (3) MG/3ML nebulizer solution 3 mL (3 mLs Nebulization Given 10/17/16 1728)  methylPREDNISolone sodium succinate (SOLU-MEDROL) 125 mg/2 mL injection 125 mg (125 mg Intravenous Given 10/17/16 1735)      Initial Impression / Assessment and Plan / ED Course  I have reviewed the triage vital signs and the nursing notes.  Pertinent labs & imaging results that were available during my care of the patient were reviewed by me and considered in my medical decision making (see chart for details).     Patient with persisting sore throat for 2 weeks with reduced oral intake as a result of pain. Afebrile, vital signs are stable aside from dropping SPO2 saturation during any sort of activity. While in the ED, patient frail and weak appearing. Will obtain repeat chest x-ray given, located COPD history to ascertain progress from recent pneumonia admission. We'll also obtain lab work, rapid strep screen and control patient's discomfort and SOB. No evidence of strep pharyngitis, PTA, or soft tissue skin infection. Patient seen and evaluated by Dr. Ralene Bathe.   5:49 PM Patient signed out to Dr. Ralene Bathe. Still awaiting labwork and cxr. Given patient's obvious weakness and respiratory condition, I suspect she may require admission for management of COPD exacerbation.   Final Clinical Impressions(s) / ED Diagnoses   Final diagnoses:  Sore throat  SOB (shortness of breath)    New Prescriptions New Prescriptions   No medications on file  I personally performed the services described in this documentation, which was scribed in my presence. The recorded information has been reviewed and is accurate.    Renita Papa, PA-C 10/17/16 1752    Quintella Reichert, MD 10/18/16 1452

## 2016-10-17 NOTE — Progress Notes (Signed)
Pharmacy Antibiotic Note  [name] is a 66 y.o. female admitted on 10/17/2016 with pneumonia.  Pharmacy has been consulted for vancomycin dosing. Pt is afebrile and WBC is elevated at 12.7. SCr is WNL.   Plan: Vancomycin 750mg  IV x 1 then 500mg  IV Q24H F/u renal fxn, C&S, clinical status and trough at [facility] F/u continuation of cefepime or other gram negative coverage  Height: 5\' 1"  (154.9 cm) Weight: 78 lb 11.3 oz (35.7 kg) IBW/kg (Calculated) : 47.8  Temp (24hrs), Avg:98.3 F (36.8 C), Min:98.3 F (36.8 C), Max:98.3 F (36.8 C)   Recent Labs Lab 10/17/16 1729  WBC 12.7*  CREATININE 0.51    Estimated Creatinine Clearance: 39.5 mL/min (by C-G formula based on SCr of 0.51 mg/dL).    Allergies  Allergen Reactions  . Banana Shortness Of Breath and Nausea And Vomiting  . Lyrica [Pregabalin] Other (See Comments)    "Disorients me"  . Pollen Extract Other (See Comments)    Seasonal     Antimicrobials this admission: Vanc 8/6>> Cefepime x 1 8/6  Dose adjustments this admission: N/A  Microbiology results: Pending  Thank you for allowing pharmacy to be a part of this patient's care.  Mauri Temkin, Rande Lawman 10/17/2016 6:51 PM

## 2016-10-17 NOTE — ED Notes (Signed)
This RN went in the room to set up a second IV on the Pt. Pt is currently refusing any IV attempts.

## 2016-10-17 NOTE — Telephone Encounter (Signed)
Pt is in the ED Lm for lisa to rtc

## 2016-10-17 NOTE — ED Triage Notes (Signed)
Pt presents for evaluation of sore throat and URI symptoms x 2 weeks. Pt reports has tried over the counter drops with no improvement, unable to eat and drink per normal due to pain. Pt wears 3L O2  Chronic.

## 2016-10-17 NOTE — Telephone Encounter (Signed)
[name] with [facility] home health want to informed the doctor she will send pt to the ER, due to patient not feeling well.

## 2016-10-17 NOTE — H&P (Signed)
Date: 10/18/2016               Patient Name:  [name] MRN: [id number]  DOB: 27-Apr-1950 Age / Sex: 66 y.o., female   PCP: Lars Mage, MD         Medical Service: Internal Medicine Teaching Service         Attending Physician: Dr. Oval Linsey, MD    First Contact: Dr. Thomasene Ripple Pager: [phone]  Second Contact: Dr. Maryellen Pile Pager: [phone]       After Hours (After 5p/  First Contact Pager: [phone]  weekends / holidays): Second Contact Pager: [phone]   Chief Complaint: dysphagia, odynophagia, hoarseness  History of Present Illness: 66 yo female with PMH of COPD on 3L at home, Osteoporosis,  Tobacco abuse, current every day smoker, Bipolar 1, pressure ulcer on L buttock, HCAP 08/31/16, GERD, Duodenal ulcer, presents to the Emergency department with odynophagia, dysphagia, shortness of breath, hoarseness.  Her throat problems began about 3 months ago per pt with difficulty swallowing and some intermittent muffling of her voice.  She was seen in clinic about his issue on 6/28 and was referred to ENT for further evaluation.  The pt had an appointment scheduled with Dr. Redmond Baseman (ENT) for laryngoscopy.  However, 2 weeks ago the hoarseness and dysphagia began again and it was worse than before and has been progressively worsening, she also notes that it is painful as well unlike before and has been increasingly more painful and difficult when swallowing.  It has progressed to the point where she has been unable to eat or drink for the last two days and it is difficult for her to talk during our interview due to pain and hoarseness.  She has also had cough productive of non bloody, brown sputum develop during this time and notes increased shortness of breath and generalized weakness however she is still able to ambulate. Of note pt was admitted for HCAP on 08/31/16 treated with IV ceftriaxone 1g daily,  IV azithro 500mg  daily, prednisone 40mg  daily for 4 days and given po  azithro 500mg  and  for two days after discharge.   Pt denies fever, CP, constipation, diarrhea, n/v, palpitations, abdominal pain.    ED course: pt was X-rayed new RLL consolidation pneumonia was discovered, group A strep culture ordered, rapid strep screen, BMP revealed hypercalcemia ca>14, hypokalemia 2.6, hypernatremia 149, CBC showed mild leukocytosis, pt given GI cocktail, Solumedrol 125mg  IV, venous blood gas was ordered and showed pH 7.52, bicarb 53.4, pCO2 65.4 demonstrating metabolic alkalosis.  2 duoneb treatments were administered, CT neck with contrast was ordered, EKG ordered, magnesium, cefipime, vanc, Istat troponin ordered which was neg,   Meds:  No outpatient prescriptions have been marked as taking for the 10/17/16 encounter [facility] Encounter).     Allergies: Allergies as of 10/17/2016 - Review Complete 10/17/2016  Allergen Reaction Noted  . Banana Shortness Of Breath and Nausea And Vomiting 01/10/2012  . Lyrica [pregabalin] Other (See Comments) 06/26/2014  . Pollen extract Other (See Comments) 11/20/2012   Past Medical History:  Diagnosis Date  . Anemia   . Anxiety   . Arthritis    "all in my body, primarily in my hands" (08/31/2016)  . Bipolar disorder ([place])   . Chronic lower back pain   . Chronic pain syndrome    follows at [facility]  . COPD (chronic obstructive pulmonary disease) (HCC)    oxygen dependent (3L home continuous)  . DEFICIENCY,  VITAMIN D NOS 01/03/2007  . Depression   . Duodenal ulcer, chronic   . Elevated liver function tests   . HCAP (healthcare-associated pneumonia) 08/31/2016  . History of blood transfusion 03/2014; 10/2014   "low HgB" (11/11/2014)  . Hyperthyroidism    "borderline"  . On home oxygen therapy    "3L; 24/7" (08/31/2016)  . OSTEOPOROSIS 06/17/2009   DEXA 05/2009 : L femur -2.9; R femur -2.5. Alendronate on med list but not taking. Needs addressed ASAP as h/o fractures.    . Pneumonia X 1?  . Sciatic pain   . Shortness  of breath    "all the time" (02/27/2013)  . Substance abuse     narcotics, alcohol, tobacco  . Suicidal ideation 2007    attempted overdose 2012  Dr. Tivis Ringer report    Family History:  Family History  Problem Relation Age of Onset  . Myelodysplastic syndrome Father        Died from myelofibrosis though diagnosed post-mortem  . Stroke Neg Hx   . Cancer Neg Hx     Social History:  Social History   Social History  . Marital status: Divorced    Spouse name: N/A  . Number of children: 2  . Years of education: N/A   Occupational History  . retired Unemployed   Social History Main Topics  . Smoking status: Current Every Day Smoker    Packs/day: 0.10    Years: 48.00    Types: Cigarettes  . Smokeless tobacco: Never Used     Comment: Patient doesn't want to quit  . Alcohol use No     Comment: 08/31/2016  "nothing in 10 years; never had a problem w/it"  . Drug use: No  . Sexual activity: No   Other Topics Concern  . 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  HENT: Negative for hearing loss and tinnitus.   Eyes: Negative for blurred vision and double vision.  Respiratory: Positive for cough, sputum production (non bloody, brown) and shortness of breath. Negative for hemoptysis and wheezing.   Cardiovascular: Negative for chest pain and palpitations.  Gastrointestinal: Negative for abdominal pain, blood in stool, constipation, diarrhea, heartburn, nausea and vomiting.  Genitourinary: Negative for dysuria and urgency.  Musculoskeletal: Positive for back pain and joint pain. Negative for neck pain.  Skin: Negative for rash.  Neurological: Positive for speech change  and weakness. Negative for dizziness, focal weakness, seizures, loss of consciousness and headaches.  Psychiatric/Behavioral: Negative for hallucinations. The patient is nervous/anxious.     Physical Exam: Blood pressure (!) 160/83, pulse 80, temperature 98.6 F (37 C), resp. rate 16, height 5\' 1"  (1.549 m), weight 78 lb 11.3 oz (35.7 kg), SpO2 95 %.  Physical Exam  Constitutional: She is oriented to person, place, and time. She appears cachectic. She is active.  Non-toxic appearance. She has a sickly appearance.  HENT:  Head: Normocephalic and atraumatic.  Neck: No JVD present. No thyromegaly present.  Cardiovascular: Normal rate and regular rhythm.  Exam reveals no gallop and no friction rub.   No murmur heard. Pulmonary/Chest: Accessory  muscle usage present. She has decreased breath sounds in the right upper field and the left upper field. She has wheezes (mild) in the right lower field and the left lower field. She has no rales.  Abdominal: Soft. She exhibits no distension. There is tenderness (diffuse tenderness). There is no guarding.  Musculoskeletal: She exhibits no edema.  Neurological: She is alert and oriented to person, place, and time.  Skin: Skin is warm and dry.  Dry mucous membranes, skin dry overall  Psychiatric: Her mood appears anxious.   EKG: personally reviewed my interpretation is sinus rhythm, multiple pvcs, RA enlargement, Diffuse T wave inversions  CXR: personally reviewed my interpretation is new patchy consolidation throughout the RLL, Severely hyperinflated lungs  Assessment & Plan by Problem: Principal Problem:   Dysphagia Active Problems:   Vitamin D deficiency   Chronic back pain   Osteoporosis   Bipolar I disorder, most recent episode depressed (HCC)   COPD (chronic obstructive pulmonary disease) with emphysema (HCC)   Protein-calorie malnutrition, severe (HCC)   GERD (gastroesophageal reflux disease)   Iron deficiency anemia   Smoking    Pressure ulcer   Pneumonia   Hypokalemia   Sore throat and laryngitis   Hypercalcemia   66 yo female with PMH of COPD on 3L at home, Osteoporosis,  Tobacco abuse, current every day smoker, Bipolar 1, pressure ulcer on L buttock, HCAP 08/31/16, GERD, Duodenal ulcer presents with acute on chronic progressive dysphagia, odynophagia, and hoarseness for the last 2 weeks occurring in the setting of what appears to be a RLL pneumonia exacerbating her COPD.      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    -Mild leukocytosis, cough with brown sputum -Vancomycin/Zosyn -Flonase -repeat chest X-ray in am -DUOnebs 4 times a day with 2 additional treatments PRN -Dulera 200-5  Hypercalcemia: calcium >14 ddx: hypercalcemia of malignancy, Hyperparathyroidism, increased bone turnover  -zoledronic acid 4mg  -Calcitonin 4units/kg -NS 133ml/hr -PTH, if elevated will order 1,25 vit D, phosphate -Ionized calcium in am  Hypokalemia: 2.6,  likely shifted due to metabolic alkalosis  -replacing IV, pt npo -NS fluid resuscitation will likely help normalize -repeat BMP and ECG after replacement  Hypernatremia: Pt volume depleted due to poor oral intake   -NS 189ml/hr -repeat BMP  Metabolic alkalosis: likely contraction alkalosis in  the setting of poor PO intake and prior long standing metabolic compensation to pts CO2 retention.    -NS 169ml/hr   GERD: chronic issue takes 40mg  pantoprazole at home, also history of duodenal ulcer, pt not complaining of GERD symptoms this admission.  -pantoprazole q 24 hr    Dispo: Admit patient to Inpatient with expected length of stay greater than 2 midnights.  Signed: Katherine Roan, MD 10/18/2016, 12:15 AM  Vickki Muff MD PGY-1 Internal Medicine Pager # [phone]

## 2016-10-18 ENCOUNTER — Inpatient Hospital Stay (HOSPITAL_COMMUNITY): Payer: Medicare Other

## 2016-10-18 DIAGNOSIS — R1314 Dysphagia, pharyngoesophageal phase: Secondary | ICD-10-CM

## 2016-10-18 DIAGNOSIS — F17200 Nicotine dependence, unspecified, uncomplicated: Secondary | ICD-10-CM

## 2016-10-18 DIAGNOSIS — E43 Unspecified severe protein-calorie malnutrition: Secondary | ICD-10-CM

## 2016-10-18 DIAGNOSIS — J690 Pneumonitis due to inhalation of food and vomit: Secondary | ICD-10-CM

## 2016-10-18 LAB — BASIC METABOLIC PANEL
ANION GAP: 9 (ref 5–15)
Anion gap: 10 (ref 5–15)
BUN: 17 mg/dL (ref 6–20)
BUN: 17 mg/dL (ref 6–20)
CHLORIDE: 94 mmol/L — AB (ref 101–111)
CO2: 44 mmol/L — AB (ref 22–32)
CO2: 44 mmol/L — ABNORMAL HIGH (ref 22–32)
CREATININE: 0.62 mg/dL (ref 0.44–1.00)
Calcium: 13.2 mg/dL (ref 8.9–10.3)
Calcium: 12.8 mg/dL — ABNORMAL HIGH (ref 8.9–10.3)
Chloride: 96 mmol/L — ABNORMAL LOW (ref 101–111)
Creatinine, Ser: 0.59 mg/dL (ref 0.44–1.00)
GFR calc non Af Amer: >60 mL/min (ref >60)
Glucose, Bld: 124 mg/dL — ABNORMAL HIGH (ref 65–99)
Glucose, Bld: 120 mg/dL — ABNORMAL HIGH (ref 65–99)
POTASSIUM: 3.3 mmol/L — AB (ref 3.5–5.1)
Potassium: 3 mmol/L — ABNORMAL LOW (ref 3.5–5.1)
SODIUM: 149 mmol/L — AB (ref 135–145)
Sodium: 148 mmol/L — ABNORMAL HIGH (ref 135–145)

## 2016-10-18 LAB — HEPATIC FUNCTION PANEL
ALBUMIN: 2.8 g/dL — AB (ref 3.5–5.0)
ALT: 13 U/L — ABNORMAL LOW (ref 14–54)
AST: 17 U/L (ref 15–41)
Alkaline Phosphatase: 113 U/L (ref 38–126)
BILIRUBIN TOTAL: 0.8 mg/dL (ref 0.3–1.2)
Bilirubin, Direct: <0.1 mg/dL — ABNORMAL LOW (ref 0.1–0.5)
Total Protein: 6.6 g/dL (ref 6.5–8.1)

## 2016-10-18 LAB — CBC
HEMATOCRIT: 38.2 % (ref 36.0–46.0)
HEMOGLOBIN: 10.9 g/dL — AB (ref 12.0–15.0)
MCH: 24.6 pg — ABNORMAL LOW (ref 26.0–34.0)
MCHC: 28.5 g/dL — ABNORMAL LOW (ref 30.0–36.0)
MCV: 86.2 fL (ref 78.0–100.0)
Platelets: 651 10*3/uL — ABNORMAL HIGH (ref 150–400)
RBC: 4.43 MIL/uL (ref 3.87–5.11)
RDW: 15.6 % — AB (ref 11.5–15.5)
WBC: 9.7 10*3/uL (ref 4.0–10.5)

## 2016-10-18 LAB — MAGNESIUM: MAGNESIUM: 2.2 mg/dL (ref 1.7–2.4)

## 2016-10-18 MED ORDER — OXYMETAZOLINE HCL 0.05 % NA SOLN
1.0000 | Freq: Once | NASAL | Status: DC | PRN
  Filled 2016-10-18: qty 15

## 2016-10-18 MED ORDER — IPRATROPIUM-ALBUTEROL 0.5-2.5 (3) MG/3ML IN SOLN: 3.0000 mL | Freq: Two times a day (BID) | RESPIRATORY_TRACT | Status: DC

## 2016-10-18 MED ORDER — LIDOCAINE HCL 4 % EX SOLN
0.0000 mL | Freq: Once | CUTANEOUS | Status: DC | PRN
  Filled 2016-10-18: qty 50

## 2016-10-18 MED ORDER — SILVER NITRATE-POT NITRATE 75-25 % EX MISC
1.0000 | Freq: Once | CUTANEOUS | Status: DC | PRN
  Filled 2016-10-18: qty 1

## 2016-10-18 MED ORDER — LIDOCAINE HCL 2 % EX GEL
1.0000 "application " | Freq: Once | CUTANEOUS | Status: DC | PRN
  Filled 2016-10-18 (×2): qty 5

## 2016-10-18 MED ORDER — PIPERACILLIN-TAZOBACTAM 3.375 G IVPB
3.3750 g | Freq: Three times a day (TID) | INTRAVENOUS | Status: DC
  Administered 2016-10-18 – 2016-10-20 (×6): 3.375 g via INTRAVENOUS
  Filled 2016-10-18 (×8): qty 50

## 2016-10-18 MED ORDER — BACITRACIN ZINC 500 UNIT/GM EX OINT
1.0000 "application " | TOPICAL_OINTMENT | Freq: Two times a day (BID) | CUTANEOUS | Status: DC
  Administered 2016-10-18 – 2016-10-20 (×4): 1 via TOPICAL
  Filled 2016-10-18: qty 28.35

## 2016-10-18 MED ORDER — IPRATROPIUM-ALBUTEROL 0.5-2.5 (3) MG/3ML IN SOLN
3.0000 mL | Freq: Four times a day (QID) | RESPIRATORY_TRACT | Status: DC
  Administered 2016-10-18 – 2016-10-20 (×10): 3 mL via RESPIRATORY_TRACT
  Filled 2016-10-18 (×9): qty 3

## 2016-10-18 MED ORDER — TRIPLE ANTIBIOTIC 3.5-400-5000 EX OINT
1.0000 "application " | TOPICAL_OINTMENT | Freq: Once | CUTANEOUS | Status: DC | PRN
  Filled 2016-10-18: qty 1

## 2016-10-18 MED ORDER — LIDOCAINE-EPINEPHRINE (PF) 1 %-1:200000 IJ SOLN
0.0000 mL | Freq: Once | INTRAMUSCULAR | Status: DC | PRN
  Filled 2016-10-18: qty 30

## 2016-10-18 MED ORDER — SODIUM CHLORIDE 0.9 % IV SOLN: 90.0000 mg | Freq: Once | INTRAVENOUS | Status: DC

## 2016-10-18 NOTE — Progress Notes (Signed)
Pt BP running high and also having some TRIGEMINY PVC's  Dr Shan Levans has been informed no new order,  will continue to monitor pt

## 2016-10-18 NOTE — Progress Notes (Signed)
Subjective: Patient history difficult to obtain since she cannot speak. She denies chest pain. She continues to endorse feeling cold and states even the wind from the interview walking by the bed makes her shiver.  Objective:  Vital signs in last 24 hours: Vitals:   10/18/16 0011 10/18/16 0442 10/18/16 1345 10/18/16 1527  BP: (!) 160/83 (!) 167/62 (!) 162/64   Pulse: 80 68 67   Resp: 16 20 16    Temp: 98.6 F (37 C) 98.5 F (36.9 C) 97.8 F (36.6 C)   TempSrc:   Oral   SpO2: 95% 95% 97% 97%  Weight:      Height:       Physical Exam  Constitutional:  Very thin, cachectic appearing woman laying in bed in no acute distress.  HENT:  No palpable supraclavicular lymph nodes appreciated. Oropharynx dry. Eyes sunken. Lips with peeling skin.  Cardiovascular: Normal rate, regular rhythm, normal heart sounds and intact distal pulses.   No murmur heard. Pulmonary/Chest:  Poor effort with exam. Patient gurgling and coughing during exam. Crackles appreciated on right lung base, however exam limited by patient's ability to participate.  Abdominal: Soft. She exhibits no distension. There is no tenderness.  Musculoskeletal: She exhibits no edema (of bilateral lower extremities) or tenderness (of bilateral lower extremities).  Skin: Capillary refill takes more than 3 seconds.  Skin is cool to the touch and pale. Increased skin turgor.   Assessment/Plan:  Principal Problem:   Dysphagia Active Problems:   Vitamin D deficiency   Chronic back pain   Osteoporosis   Bipolar I disorder, most recent episode depressed (HCC)   COPD (chronic obstructive pulmonary disease) with emphysema (HCC)   Protein-calorie malnutrition, severe (HCC)   GERD (gastroesophageal reflux disease)   Iron deficiency anemia   Smoking   Pressure ulcer   Pneumonia   Hypokalemia   Sore throat and laryngitis   Hypercalcemia  [name] is a 66 yo with a PMH of COPD on 3L home oxygen, tobacco abuse, and HCAP on  08/31/2016 who presented to the ED on 10/17/2016 with progressive dysphagia for 3 months, shortness of breath, and hoarseness. The specific problems addressed during the admission are as follows:  Aspiration pneumonia in setting of severe emphysema: The patient had a CXR showing a new RLL consolidation which, in the setting of progressive dysphagia, is concerning for aspiration. WBC on admission 12.7. The patient also has a L lung base opacity which had interval improvement from her previous CXR on 08/31/16 during her hospitalization for HCAP. She also had evidence of advanced emphysema on this study and on the CT of neck.  -Speech pathology consulted, appreciate recommendations for modified or full barium swallow -NPO pending swallow study -Zosyn, pharmacy consulted and appreciated -Scheduled Duoneb, QID, Dulera BID, and albuterol PRN  Progressive dysphagia: The patient had a CT scan of the neck which was concerning for multiple nodal metastases throughout the anterior mediastinum. One possible metastasis was compressing the upper esophagus, with possible invasion, and displacing the subglottic trachea anteriorly. This is likely the cause of her progressive dysphagia. Since the patient has a smoking history, COPD (emyphesema), and hypercalcemia there is concern for small cell carcinoma of the lung.  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  [ ]  Follow up PTH, calcium ionized  -Zolendronic acid, 3 mg in ED -Continue  calcitonin  Hypokalemia with metabolic alkalosis: Patient had K = 2.6 on admission; VBG showed metabolic acidosis. Given the patient's progressive dysphagia, decreased PO intake, and severe deconditioning this is likely to be a contraction alkalosis.  -Continue IV NS + K 20 meq at 100  ml/hr -BMP in AM -Strict I&O  GERD: -Pantoprazole 40 mg   VTE prophylaxis: -Lovenox 30 mg daily  Dispo: Anticipated discharge pending clinical improvement.   Thomasene Ripple, MD 10/18/2016, 5:25 PM Pager: [phone]

## 2016-10-18 NOTE — Progress Notes (Signed)
PT Cancellation Note  Patient Details Name: [name] MRN: [id number] DOB: [birth year]   Cancelled Treatment:    Reason Eval/Treat Not Completed: Patient at procedure or test/unavailable   Duncan Dull 10/18/2016, 10:40 AM Alben Deeds, PT DPT  Board Certified Neurologic Specialist [phone]

## 2016-10-18 NOTE — Progress Notes (Signed)
Internal Medicine Attending  Date: 10/18/2016  Patient name: [name] Medical record number: [id number] Date of birth: 1951-02-05 Age: 66 y.o. Gender: female  I saw and evaluated the patient. I reviewed the resident's note by Dr. Berneice Gandy and I agree with the resident's findings and plans as documented in her progress note.  Please see my H&P dated 10/18/2016 and attached to Dr. Maudie Flakes H&P dated 10/17/2016 for the specifics of my evaluation, assessment, and plan from earlier in the day. Of note, I favor a squamous cell carcinoma of the head and neck or esophagus over a small cell carcinoma, although either may cause hypercalcemia of malignancy.

## 2016-10-18 NOTE — Consult Note (Signed)
[name], [name] 66 y.o., female [id number]     Chief Complaint: throat pain  HPI: no history obtainable from the patient this evening.  From chart, apparently has had dysphagia and weight loss for several months.  Several bouts of aspiration pneumonia.  Past and current smoking hx.  CT today shows multiple bilateral low neck necrotic nodes including mediastinal nodes.  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    Voice very weak.  No breathing difficulty except due to pneumonia and overall weakness/cachexia.    PMH: Past Medical History:  Diagnosis Date  . Anemia   . Anxiety   . Arthritis    "all in my body, primarily in my hands" (08/31/2016)  . Bipolar disorder ([place])   . Chronic lower back pain   . Chronic pain syndrome    follows at [facility]  . COPD (chronic obstructive pulmonary disease) (HCC)    oxygen dependent (3L home continuous)  . DEFICIENCY, VITAMIN D NOS 01/03/2007  . Depression   . Duodenal ulcer, chronic   . Elevated liver function tests   . HCAP (healthcare-associated pneumonia) 08/31/2016  . History of blood transfusion 03/2014; 10/2014   "low HgB" (11/11/2014)  . Hyperthyroidism    "borderline"  . On home oxygen therapy    "3L; 24/7" (08/31/2016)  . OSTEOPOROSIS 06/17/2009   DEXA 05/2009 : L femur -2.9; R femur -2.5. Alendronate on med list but not taking. Needs addressed ASAP as h/o fractures.    . Pneumonia X 1?  . Sciatic pain   . Shortness of breath    "all the time" (02/27/2013)  . Substance abuse     narcotics, alcohol, tobacco  . Suicidal ideation 2007    attempted overdose 2012  Dr. Tivis Ringer report    Surg Hx: Past Surgical History:  Procedure Laterality Date  . ANKLE DEBRIDEMENT Left 10/2010  . AUGMENTATION MAMMAPLASTY Bilateral ~ 2007  . CATARACT EXTRACTION W/ INTRAOCULAR LENS  IMPLANT, BILATERAL Bilateral   . CESAREAN SECTION   1974; 1979  . ESOPHAGOGASTRODUODENOSCOPY N/A 11/07/2014   Procedure: ESOPHAGOGASTRODUODENOSCOPY (EGD);  Surgeon: Irene Shipper, MD;  Location: [facility] ENDOSCOPY;  Service: Endoscopy;  Laterality: N/A;  . FRACTURE SURGERY    . INTRAMEDULLARY (IM) NAIL INTERTROCHANTERIC Left 06/10/2016   Procedure: LEFT FEMUR INTRAMEDULLARY (IM) NAIL INTERTROCHANTRIC;  Surgeon: Leandrew Koyanagi, MD;  Location: [place];  Service: Orthopedics;  Laterality: Left;  . ORIF ANKLE FRACTURE Left 10/2010  . TUBAL LIGATION  1979    FHx:   Family History  Problem Relation Age of Onset  . Myelodysplastic syndrome Father        Died from myelofibrosis though diagnosed post-mortem  . Stroke Neg Hx   . Cancer Neg Hx    SocHx:  reports that she has been smoking Cigarettes.  She has a 4.80 pack-year smoking history. She has never used smokeless tobacco. She reports that she does not drink alcohol or use drugs.  ALLERGIES:  Allergies  Allergen Reactions  . Banana Shortness Of Breath and Nausea And Vomiting  . Lyrica [Pregabalin] Other (See Comments)    "Disorients me"  . Pollen Extract Other (See Comments)    Seasonal     Medications Prior to Admission  Medication Sig Dispense Refill  . albuterol (PROAIR HFA) 108 (90 Base) MCG/ACT inhaler INHALE ONE PUFF BY MOUTH EVERY 2 TO 4 HOURS AS NEEDED FOR WHEEZING  8.5 g 12  . alendronate (FOSAMAX) 10 MG tablet Take 1 tablet (10 mg total) by mouth daily before breakfast. Take with a full glass of water on an empty stomach. 90 tablet 1  . Cholecalciferol 1000 units TBDP Take 1,000 mg by mouth daily. 60 tablet 2  . fluticasone (FLONASE) 50 MCG/ACT nasal spray Place 2 sprays into both nostrils daily.    . Fluticasone-Salmeterol (ADVAIR DISKUS) 250-50 MCG/DOSE AEPB INHALE 1 PUFF INTO THE LUNGS 2 TIMES DAILY 5 each 6  . gabapentin (NEURONTIN) 300 MG capsule Take 300 mg by mouth at bedtime.     Marland Kitchen HYDROcodone-acetaminophen (NORCO/VICODIN) 5-325 MG tablet Take 1-2 tablets by mouth every 4 (four)  hours as needed for moderate pain. 60 tablet 0  . ipratropium-albuterol (DUONEB) 0.5-2.5 (3) MG/3ML SOLN 4 times a day (breakfast, lunch, dinner, bedtime).Can take 2 additional treatments if needed on bad days. DX J43.8 (Patient taking differently: Take 3 mLs by nebulization 4 (four) times daily. (breakfast, lunch, dinner, bedtime).Can take 2 additional treatments if needed on bad days. DX J43.8) 360 mL 3  . lamoTRIgine (LAMICTAL) 100 MG tablet TAKE ONE TABLET EVERY DAY 30 tablet 2  . levocetirizine (XYZAL) 5 MG tablet Take 1 tablet (5 mg total) by mouth at bedtime. 30 tablet 2  . Melatonin 10 MG TABS Take 1 tablet by mouth at bedtime as needed. 90 tablet 3  . OLANZapine (ZYPREXA) 2.5 MG tablet Take 2.5 mg by mouth at bedtime.    . ondansetron (ZOFRAN) 4 MG tablet Take 1 tablet (4 mg total) by mouth every 8 (eight) hours as needed for nausea or vomiting. 20 tablet 0  . OXYGEN Inhale 3 L into the lungs continuous.    . pantoprazole (PROTONIX) 40 MG tablet Take 1 tablet (40 mg total) by mouth daily. 30 tablet 1  . polyethylene glycol powder (GLYCOLAX/MIRALAX) powder TAKE 17G DISSOLVED IN 8OZ WATER DAILY ASNEEDED FOR MODERATE CONSTIPATION 527 g 2  . venlafaxine XR (EFFEXOR-XR) 37.5 MG 24 hr capsule Take 37.5 mg by mouth every evening.    . white petrolatum (VASELINE) GEL Apply 1 application topically daily. To both feet for dry skin      Results for orders placed or performed during the hospital encounter of 10/17/16 (from the past 48 hour(s))  Rapid strep screen     Status: None   Collection Time: 10/17/16  2:39 PM  Result Value Ref Range   Streptococcus, Group A Screen (Direct) NEGATIVE NEGATIVE    Comment: (NOTE) A Rapid Antigen test may result negative if the antigen level in the sample is below the detection level of this test. The FDA has not cleared this test as a stand-alone test therefore the rapid antigen negative result has reflexed to a Group A Strep culture.   Culture, group A  strep     Status: None (Preliminary result)   Collection Time: 10/17/16  2:39 PM  Result Value Ref Range   Specimen Description THROAT    Special Requests NONE Reflexed from [id number]    Culture CULTURE REINCUBATED FOR BETTER GROWTH    Report Status PENDING   CBC with Differential     Status: Abnormal   Collection Time: 10/17/16  5:29 PM  Result Value Ref Range   WBC 12.7 (H) 4.0 - 10.5 K/uL   RBC 4.59 3.87 - 5.11 MIL/uL   Hemoglobin 11.3 (L) 12.0 - 15.0 g/dL   HCT 39.0 36.0 - 46.0 %   MCV 85.0 78.0 - 100.0 fL  MCH 24.6 (L) 26.0 - 34.0 pg   MCHC 29.0 (L) 30.0 - 36.0 g/dL   RDW 15.2 11.5 - 15.5 %   Platelets 641 (H) 150 - 400 K/uL   Neutrophils Relative % 82 %   Neutro Abs 10.4 (H) 1.7 - 7.7 K/uL   Lymphocytes Relative 12 %   Lymphs Abs 1.5 0.7 - 4.0 K/uL   Monocytes Relative 6 %   Monocytes Absolute 0.8 0.1 - 1.0 K/uL   Eosinophils Relative 0 %   Eosinophils Absolute 0.0 0.0 - 0.7 K/uL   Basophils Relative 0 %   Basophils Absolute 0.0 0.0 - 0.1 K/uL  Basic metabolic panel     Status: Abnormal   Collection Time: 10/17/16  5:29 PM  Result Value Ref Range   Sodium 149 (H) 135 - 145 mmol/L   Potassium 2.6 (LL) 3.5 - 5.1 mmol/L    Comment: CRITICAL RESULT CALLED TO, READ BACK BY AND VERIFIED WITH: C.HAMMON,RN 10/17/16 @ [address]    Chloride 91 (L) 101 - 111 mmol/L   CO2 46 (H) 22 - 32 mmol/L   Glucose, Bld 92 65 - 99 mg/dL   BUN 17 6 - 20 mg/dL   Creatinine, Ser 0.51 0.44 - 1.00 mg/dL   Calcium 14.0 (HH) 8.9 - 10.3 mg/dL    Comment: CRITICAL RESULT CALLED TO, READ BACK BY AND VERIFIED WITH: C.HAMMON,RN 10/17/16 @ [address]    GFR calc non Af Amer >60 >60 mL/min   GFR calc Af Amer >60 >60 mL/min    Comment: (NOTE) The eGFR has been calculated using the CKD EPI equation. This calculation has not been validated in all clinical situations. eGFR's persistently <60 mL/min signify possible Chronic Kidney Disease.    Anion gap 12 5 - 15  I-stat troponin, ED      Status: None   Collection Time: 10/17/16  7:23 PM  Result Value Ref Range   Troponin i, poc 0.00 0.00 - 0.08 ng/mL   Comment 3            Comment: Due to the release kinetics of cTnI, a negative result within the first hours of the onset of symptoms does not rule out myocardial infarction with certainty. If myocardial infarction is still suspected, repeat the test at appropriate intervals.   I-Stat venous blood gas, ED     Status: Abnormal   Collection Time: 10/17/16  7:26 PM  Result Value Ref Range   pH, Ven 7.520 (H) 7.250 - 7.430   pCO2, Ven 65.4 (H) 44.0 - 60.0 mmHg   pO2, Ven 54.0 (H) 32.0 - 45.0 mmHg   Bicarbonate 53.4 (H) 20.0 - 28.0 mmol/L   TCO2 >50 0 - 100 mmol/L   O2 Saturation 89.0 %   Acid-Base Excess 26.0 (H) 0.0 - 2.0 mmol/L   Patient temperature HIDE    Sample type VENOUS   Magnesium     Status: None   Collection Time: 10/18/16  1:21 AM  Result Value Ref Range   Magnesium 2.2 1.7 - 2.4 mg/dL  Hepatic function panel     Status: Abnormal   Collection Time: 10/18/16  1:21 AM  Result Value Ref Range   Total Protein 6.6 6.5 - 8.1 g/dL   Albumin 2.8 (L) 3.5 - 5.0 g/dL   AST 17 15 - 41 U/L   ALT 13 (L) 14 - 54 U/L   Alkaline Phosphatase 113 38 - 126 U/L   Total Bilirubin 0.8 0.3 - 1.2 mg/dL  Bilirubin, Direct <0.1 (L) 0.1 - 0.5 mg/dL   Indirect Bilirubin NOT CALCULATED 0.3 - 0.9 mg/dL  Basic metabolic panel     Status: Abnormal   Collection Time: 10/18/16  1:21 AM  Result Value Ref Range   Sodium 148 (H) 135 - 145 mmol/L   Potassium 3.0 (L) 3.5 - 5.1 mmol/L   Chloride 94 (L) 101 - 111 mmol/L   CO2 44 (H) 22 - 32 mmol/L   Glucose, Bld 124 (H) 65 - 99 mg/dL   BUN 17 6 - 20 mg/dL   Creatinine, Ser 0.62 0.44 - 1.00 mg/dL   Calcium 13.2 (HH) 8.9 - 10.3 mg/dL    Comment: CRITICAL RESULT CALLED TO, READ BACK BY AND VERIFIED WITH: ELLIOTT S,RN 10/18/16 0205 [address]    GFR calc non Af Amer >60 >60 mL/min   GFR calc Af Amer >60 >60 mL/min    Comment: (NOTE) The  eGFR has been calculated using the CKD EPI equation. This calculation has not been validated in all clinical situations. eGFR's persistently <60 mL/min signify possible Chronic Kidney Disease.    Anion gap 10 5 - 15  Basic metabolic panel     Status: Abnormal   Collection Time: 10/18/16  3:52 AM  Result Value Ref Range   Sodium 149 (H) 135 - 145 mmol/L   Potassium 3.3 (L) 3.5 - 5.1 mmol/L   Chloride 96 (L) 101 - 111 mmol/L   CO2 44 (H) 22 - 32 mmol/L   Glucose, Bld 120 (H) 65 - 99 mg/dL   BUN 17 6 - 20 mg/dL   Creatinine, Ser 0.59 0.44 - 1.00 mg/dL   Calcium 12.8 (H) 8.9 - 10.3 mg/dL   GFR calc non Af Amer >60 >60 mL/min   GFR calc Af Amer >60 >60 mL/min    Comment: (NOTE) The eGFR has been calculated using the CKD EPI equation. This calculation has not been validated in all clinical situations. eGFR's persistently <60 mL/min signify possible Chronic Kidney Disease.    Anion gap 9 5 - 15  CBC     Status: Abnormal   Collection Time: 10/18/16  3:52 AM  Result Value Ref Range   WBC 9.7 4.0 - 10.5 K/uL   RBC 4.43 3.87 - 5.11 MIL/uL   Hemoglobin 10.9 (L) 12.0 - 15.0 g/dL   HCT 38.2 36.0 - 46.0 %   MCV 86.2 78.0 - 100.0 fL   MCH 24.6 (L) 26.0 - 34.0 pg   MCHC 28.5 (L) 30.0 - 36.0 g/dL   RDW 15.6 (H) 11.5 - 15.5 %   Platelets 651 (H) 150 - 400 K/uL   Dg Chest 2 View  Result Date: 10/18/2016 CLINICAL DATA:  66 year old female with a history of shortness of breath EXAM: CHEST  2 VIEW COMPARISON:  06/09/2016, 08/31/2016, 10/17/2016 FINDINGS: Cardiomediastinal silhouette unchanged in size and contour. Stigmata of emphysema, with increased retrosternal airspace, flattened hemidiaphragms, increased AP diameter, and hyperinflation on the AP view. Similar appearance of lucency of the upper lobes, compatible with lung bulla formation. Patchy airspace opacity of the bilateral, right greater than left mid and lower lungs. No evidence of large pleural effusion. No displaced fracture. Accentuated  kyphotic curvature. Density of the chest wall anteriorly, compatible with breast reconstruction/augmentation. IMPRESSION: Similar appearance of bilateral airspace disease, compatible with multifocal pneumonia superimposed on advanced emphysema. As was previously suggested, follow-up PA and lateral chest x-ray in 4-6 weeks is recommended after treatment. Electronically Signed   By: Corrie Mckusick D.O.  On: 10/18/2016 10:23   Dg Chest 2 View  Result Date: 10/17/2016 CLINICAL DATA:  Cough EXAM: CHEST  2 VIEW COMPARISON:  08/31/2016 chest radiograph. FINDINGS: Stable cardiomediastinal silhouette with normal heart size. No pneumothorax. No pleural effusion. Severely hyperinflated lungs with advanced emphysema. New patchy consolidation throughout the right lower lobe. Mild patchy opacity at the left lung base is improved since 08/31/2016. IMPRESSION: 1. New patchy consolidation throughout the right lower lobe. Mild patchy left lung base opacity is improved since 08/31/2016 chest radiograph. These findings are favored to represent a new multilobar pneumonia. Recommend follow-up PA and lateral post treatment chest radiographs in 4-6 weeks. 2. Severely hyperinflated lungs with advanced emphysema, compatible with COPD. Electronically Signed   By: Ilona Sorrel M.D.   On: 10/17/2016 18:37   Ct Soft Tissue Neck W Contrast  Result Date: 10/18/2016 CLINICAL DATA:  Initial evaluation for acute sore throat, stridor. EXAM: CT NECK WITH CONTRAST TECHNIQUE: Multidetector CT imaging of the neck was performed using the standard protocol following the bolus administration of intravenous contrast. CONTRAST:  32m ISOVUE-300 IOPAMIDOL (ISOVUE-300) INJECTION 61% COMPARISON:  None. FINDINGS: Pharynx and larynx: Oral cavity within normal limits without mass lesion or loculated fluid collection. No acute abnormality about the dentition. [place] tonsils symmetric and within normal limits bilaterally. Parapharyngeal fat preserved.  Nasopharynx normal. Retropharyngeal soft tissues within normal limits. Remainder of the hypopharynx and supraglottic larynx within normal limits. Epiglottis normal. Vallecula clear. True cords symmetric and normal. Subglottic trachea somewhat displaced anteriorly but remains widely patent. Salivary glands: Salivary glands including the parotid and submandibular glands are normal. Thyroid: 9 mm hypodense right thyroid nodule, of doubtful significance. Thyroid otherwise unremarkable. Lymph nodes: Patient is somewhat cachectic. There is extensive heterogeneous mass like opacity positioned at the upper mediastinum near the thoracic inlet, posterior to the trachea and thyroid (series 3, image 109). This measures approximately 4.3 x 5.4 x 4.8 cm, and is felt to be consistent with a large nodal conglomerate. Evidence for associated necrosis throughout this area. Adjacent right supraclavicular necrotic nodal mass measures 1.8 x 2.8 cm (series 3, image 108). Left supraclavicular necrotic nodal mass measures 1.7 x 2.2 cm (series 3, image 107). Scattered necrotic adenopathy extends into the upper mediastinum. Right peritracheal nodes measure up to 1.7 cm (series 3, image 124). Pretracheal node measures 1.4 cm (series 3, image 124). Prevascular nodes measure up to 2.1 x 1.9 cm (series 3, image 132). Finding concerning for nodal metastases. There is secondary mass effect on the subglottic trachea which is displaced anteriorly by remains patent. No conglomerate abuts the posterior surface of the thyroid without thyroidal invasion. Nodal mass surrounds the upper esophagus which is poorly visualized. Suspected secretions/debris noted within the upper esophageal lumen at this level (series 3, image 119). Possible esophageal invasion not entirely excluded. No other discrete adenopathy seen superiorly within the neck. Vascular: Normal intravascular enhancement seen throughout the neck. Vascular calcifications about the left carotid  bifurcation. Nodal mass abuts the medial aspects of the common carotid artery's bilaterally. Additionally, nodal mass partially encompasses the proximal right V1 segment (series 3, image 151). Limited intracranial: Unremarkable. Visualized orbits: Globes and orbital soft tissues within normal limits. Mastoids and visualized paranasal sinuses: Paranasal sinuses are clear. Small osteoma noted at the right ethmoidal air cells. Mastoid air cells and middle ear cavities are clear. Skeleton: No acute osseus abnormality. No worrisome lytic or blastic osseous lesions. Upper chest: Severe emphysema partially visualized. Linear scarring and/or atelectasis partially visualize within the posterior left  lower lobe. Irregular pleural thickening noted at this level as well. IMPRESSION: 1. Extensive necrotic adenopathy involving the lower neck/upper chest, bilateral supraclavicular regions, and upper mediastinum as above, most concerning for nodal metastases. Nodal disease Sir brown scanning compresses the upper esophagus, with possible esophageal invasion not entirely excluded. Subglottic trachea displaced anteriorly but remains patent. 2. Severe emphysema with irregular pleuroparenchymal scarring and thickening at the left lower lobe. Further evaluation with dedicated cross-sectional imaging of the chest recommended. Electronically Signed   By: Jeannine Boga M.D.   On: 10/18/2016 00:16     Blood pressure (!) 162/64, pulse 67, temperature 97.8 F (36.6 C), temperature source Oral, resp. rate 16, height 5' 1"  (1.549 m), weight 35.7 kg (78 lb 11.3 oz), SpO2 97 %.  PHYSICAL EXAM: Overall appearance:  Very cachectic.  Sleepy but responsive and appropriate.  Occasionally combative.   Head:  NCAT Ears:  clear Nose:  Crusted NS, clear ND Oral Cavity:  Dry.  Teeth in fair to good repair.   Oral Pharynx/Hypopharynx/Larynx:  dry Neuro:did not assess Neck:  Cachectic.  Despite minimal muscle mass, no discrete nodes  palpable.  Nl laryngeal crepitus  Using the flexible laryngoscope with 5 ml 2% viscous xylocaine topical anesthesia, NP is clear.  OP clear.  HP/Larynx overall narrow.  Possible bilateral cord paresis vs poor effort with adequate respirations and slightly phonatory vocal quality.    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  Severe inanition/cachexia.  Hypercalcemia.    Will discuss CT with Radiology tomorrow.  Ba swallow might be possible, but would not do a modified swallow.  May need panendoscopy with biopsy to stage the primary, when/if she is a candidate for general anesthesia.  Will need Med Onc, Rad Onc consults.  Likely a gastrostomy tube but not sure if this could be percutaneous or would need to be performed  open.  Oncology may suggest involvement of Thoracic surgery.    Should probably check TSH and prealbumin.  Jodi Marble 08/14/8464, 9:10 PM

## 2016-10-18 NOTE — Evaluation (Signed)
Physical Therapy Evaluation Patient Details Name: [name] MRN: [id number] DOB: 24-Nov-1950 Today's Date: 10/18/2016   History of Present Illness  66 y.o. female admitted on 10/17/2016 with pneumonia; history of COPD, chronic pain syndrome, osteoporosis, bipolar 1 disorder  Clinical Impression  Orders received for PT evaluation. Patient demonstrates deficits in functional mobility as indicated below. Will benefit from continued skilled PT to address deficits and maximize function. Will see as indicated and progress as tolerated.  OF NOTE: this session limited by fatigue    Follow Up Recommendations SNF;Supervision/Assistance - 24 hour    Equipment Recommendations  None recommended by PT    Recommendations for Other Services       Precautions / Restrictions Precautions Precautions: Fall      Mobility  Bed Mobility Overal bed mobility: Needs Assistance Bed Mobility: Supine to Sit;Sit to Supine     Supine to sit: Max assist Sit to supine: Max assist   General bed mobility comments: Max assist for bed mobility, patient able to initiate reach for rails and movement of LEs but does not have the strength to pull herself to EOb or upright without max assist  Transfers Overall transfer level: Needs assistance Equipment used: 1 person hand held assist (face to face) Transfers: Sit to/from [employer] Transfers Sit to Stand: Mod assist Stand pivot transfers: Mod assist       General transfer comment: Moderate assist to power up to standing, posterior bias noted, moderate assist to take shuffling steps to [facility], assist to [facility] and back to bed. Patient extremely fatigued by little activity  Ambulation/Gait                Stairs            Wheelchair Mobility    Modified Rankin (Stroke Patients Only)       Balance Overall balance assessment: Needs assistance   Sitting balance-Leahy Scale: Fair     Standing balance support: During functional  activity Standing balance-Leahy Scale: Poor Standing balance comment: heavy reliance on assist                             Pertinent Vitals/Pain      Home Living Family/patient expects to be discharged to:: Private residence Living Arrangements: Alone Available Help at Discharge: Friend(s);Available PRN/intermittently Type of Home: Apartment Home Access: Level entry     Home Layout: One level Home Equipment: Walker - 4 wheels;Shower seat;Other (comment);Grab bars - tub/shower;Grab bars - toilet (oxygen) Additional Comments: information obtained from chart review prior admission, questionable whether accurate or not as patient not able to provide details to confirm.    Prior Function Level of Independence: Needs assistance   Gait / Transfers Assistance Needed: Used rollator            Hand Dominance   Dominant Hand: Right    Extremity/Trunk Assessment   Upper Extremity Assessment Upper Extremity Assessment: Generalized weakness (frail)    Lower Extremity Assessment Lower Extremity Assessment: Generalized weakness (frail)    Cervical / Trunk Assessment Cervical / Trunk Assessment: Kyphotic (frail)  Communication   Communication: No difficulties  Cognition Arousal/Alertness: Lethargic Behavior During Therapy: Flat affect Overall Cognitive Status: No family/caregiver present to determine baseline cognitive functioning  General Comments      Exercises     Assessment/Plan    PT Assessment Patient needs continued PT services  PT Problem List Decreased strength;Decreased activity tolerance;Decreased balance;Decreased mobility;Decreased cognition;Decreased safety awareness;Decreased skin integrity;Cardiopulmonary status limiting activity       PT Treatment Interventions DME instruction;Gait training;Functional mobility training;Therapeutic activities;Therapeutic exercise;Balance  training;Neuromuscular re-education;Patient/family education    PT Goals (Current goals can be found in the Care Plan section)  Acute Rehab PT Goals Patient Stated Goal: none stated PT Goal Formulation: With patient Time For Goal Achievement: 11/01/16 Potential to Achieve Goals: Fair    Frequency Min 2X/week   Barriers to discharge        Co-evaluation               AM-PAC PT "6 Clicks" Daily Activity  Outcome Measure Difficulty turning over in bed (including adjusting bedclothes, sheets and blankets)?: Total Difficulty moving from lying on back to sitting on the side of the bed? : Total Difficulty sitting down on and standing up from a chair with arms (e.g., wheelchair, bedside commode, etc,.)?: A Lot Help needed moving to and from a bed to chair (including a wheelchair)?: A Lot Help needed walking in hospital room?: Total Help needed climbing 3-5 steps with a railing? : Total 6 Click Score: 8    End of Session Equipment Utilized During Treatment: Gait belt;Oxygen (3 liters) Activity Tolerance: Patient limited by fatigue;Patient limited by lethargy Patient left: in bed;with call bell/phone within reach;with bed alarm set Nurse Communication: Mobility status PT Visit Diagnosis: Unsteadiness on feet (R26.81);Muscle weakness (generalized) (M62.81);Adult, failure to thrive (R62.7)    Time: 2919-1660 PT Time Calculation (min) (ACUTE ONLY): 15 min   Charges:   PT Evaluation $PT Eval Moderate Complexity: 1 Mod     PT G Codes:        Alben Deeds, PT DPT  Board Certified Neurologic Specialist [place] 10/18/2016, 4:03 PM

## 2016-10-18 NOTE — Progress Notes (Signed)
Pt admitted from ED per stretcher accompanied by RN, on arrival pt was alert, self introduced to pt, ID bracelet varified with pt, her speech is very soft, looks weak, BP running high, skin assessment done, fall risk and fall assessment done, kept bed alarm on, pt keeping saying she wants to go out and smoke, I wanted to get her nicotin patch but pt said it doesn't work for her, prescribed medication given, call light and phone within reach and pt able to demonstrate how to use them, admission history not done per pt said she feel too weak to talk

## 2016-10-18 NOTE — Consult Note (Addendum)
[place] Nurse wound consult note Reason for Consult: left buttock Wound type: healed pressure injury; right buttock. Nothing on the left buttock Patient is extremely thin and has no subcutaneous tissue to protect bony prominences.  Pressure Injury POA: Yes Wound DSW:VTVNRWC of healing area, closed and pink Drainage (amount, consistency, odor) none Periwound: intact  Dressing procedure/placement/frequency: Continue silicone foam to protect area of previous injury.    Discussed POC with patient and bedside nurse.  Re consult if needed, will not follow at this time. Thanks  Anchor Dwan [employer], RN,CWOCN, CNS, [place] [phone])

## 2016-10-18 NOTE — Progress Notes (Signed)
CRITICAL VALUE ALERT  Critical Value:  Calcium 13.2  Date & Time Notied:  10/18/2016 @ 0210  Provider Notified: Dr Shan Levans  Orders Received/Actions taken: non

## 2016-10-18 NOTE — Progress Notes (Signed)
Pharmacy Antibiotic Note  [name] is a 66 y.o. female admitted on 10/17/2016 with pneumonia.  Pharmacy has been consulted for zosyn dosing. Pt is afebrile and WBC was elevated on admission, now WNL.  SCr is WNL.   Plan: Zosyn 3.375 gm IV q8h (4 hour infusion).   Height: 5\' 1"  (154.9 cm) Weight: 78 lb 11.3 oz (35.7 kg) IBW/kg (Calculated) : 47.8  Temp (24hrs), Avg:98.5 F (36.9 C), Min:98.3 F (36.8 C), Max:98.6 F (37 C)   Recent Labs Lab 10/17/16 1729 10/18/16 0121 10/18/16 0352  WBC 12.7*  --  9.7  CREATININE 0.51 0.62 0.59    Estimated Creatinine Clearance: 39.5 mL/min (by C-G formula based on SCr of 0.59 mg/dL).    Allergies  Allergen Reactions  . Banana Shortness Of Breath and Nausea And Vomiting  . Lyrica [Pregabalin] Other (See Comments)    "Disorients me"  . Pollen Extract Other (See Comments)    Seasonal     Antimicrobials this admission: Vanc 8/6>>8/7 Cefepime x 1 8/6 Zosyn 8/7 >>  Dose adjustments this admission: N/A  Microbiology results: 8/6 rapid strep screen negative  Thank you for allowing pharmacy to be a part of this patient's care.  [employer], Pharm.D., BCPS Clinical Pharmacist Pager: [phone] Clinical phone for 10/18/2016 from 8:30-4:00 is [phone]. After 4pm, please call Main Rx [phone]) for assistance. 10/18/2016 11:26 AM

## 2016-10-18 NOTE — Progress Notes (Signed)
Initial Nutrition Assessment  DOCUMENTATION CODES:   Underweight, Severe malnutrition in context of chronic illness  INTERVENTION:   -RD will follow for diet advancement and supplement as appropriate  NUTRITION DIAGNOSIS:   Malnutrition (Severe) related to chronic illness (COPD) as evidenced by percent weight loss, energy intake < 75% for > or equal to 1 month, severe depletion of body fat, severe depletion of muscle mass.  GOAL:   Patient will meet greater than or equal to 90% of their needs  MONITOR:   Diet advancement, Labs, Weight trends, Skin, I & O's  REASON FOR ASSESSMENT:   Malnutrition Screening Tool    ASSESSMENT:   66 yo female with PMH of COPD on 3L at home, Osteoporosis,  Tobacco abuse, current every day smoker, Bipolar 1, pressure ulcer on L buttock, HCAP 08/31/16, GERD, Duodenal ulcer presents with acute on chronic progressive dysphagia, odynophagia, and hoarseness for the last 2 weeks occurring in the setting of what appears to be a RLL pneumonia exacerbating her COPD.    Pt admitted with hoarseness/ Dysphagia/ odynophagia.   Spoke with pt and caregiver. Both confirm a decreased appetite over the past 3 weeks, related to difficulty swallowing. Caregiver shares that pt has an upcoming ENT appointment on Monday, 10/24/16, to further address swallowing difficulty. Pt denies recent diet modifications or consuming nutritional supplements PTA. Pt also endorses more difficulty swallowing solids than liquids.   Wt hx reviewed. Noted pt has experienced a 20% wt loss over the past 4 months, which is significant for time frame.   Nutrition-Focused physical exam completed. Findings are severe fat depletion, severe muscle depletion, and no edema.   Discussed potential for diet advancement. Pt understanding of rationale for NPO diet order and is eager to resolve dysphagia.  Labs reviewed: Na: 149, K: 3.3 (on IV supplementation).   Diet Order:  Diet NPO time  specified  Skin:  Reviewed, no issues (healed rt buttock wound)  Last BM:  PTA  Height:   Ht Readings from Last 1 Encounters:  10/17/16 5\' 1"  (1.549 m)    Weight:   Wt Readings from Last 1 Encounters:  10/17/16 78 lb 11.3 oz (35.7 kg)    Ideal Body Weight:  47.7 kg  BMI:  Body mass index is 14.87 kg/m.  Estimated Nutritional Needs:   Kcal:  1200-1400  Protein:  55-70 grams  Fluid:  1.2-1.4 L  EDUCATION NEEDS:   Education needs addressed  Brunella Wileman A. Jimmye Norman, RD, LDN, CDE Pager: [phone] After hours Pager: [phone]

## 2016-10-19 ENCOUNTER — Ambulatory Visit
Admit: 2016-10-19 | Discharge: 2016-10-19 | Disposition: A | Discharge status: Home / Self Care | Payer: Medicare Other | Attending: Radiation Oncology | Admitting: Radiation Oncology

## 2016-10-19 DIAGNOSIS — J9621 Acute and chronic respiratory failure with hypoxia: Secondary | ICD-10-CM

## 2016-10-19 DIAGNOSIS — E876 Hypokalemia: Secondary | ICD-10-CM

## 2016-10-19 DIAGNOSIS — R599 Enlarged lymph nodes, unspecified: Secondary | ICD-10-CM

## 2016-10-19 LAB — BASIC METABOLIC PANEL
Anion gap: 11 (ref 5–15)
Anion gap: 11 (ref 5–15)
BUN: 10 mg/dL (ref 6–20)
BUN: 11 mg/dL (ref 6–20)
CHLORIDE: 104 mmol/L (ref 101–111)
CHLORIDE: 106 mmol/L (ref 101–111)
CO2: 38 mmol/L — ABNORMAL HIGH (ref 22–32)
CO2: 35 mmol/L — AB (ref 22–32)
Calcium: 11 mg/dL — ABNORMAL HIGH (ref 8.9–10.3)
Calcium: 10.7 mg/dL — ABNORMAL HIGH (ref 8.9–10.3)
Creatinine, Ser: 0.46 mg/dL (ref 0.44–1.00)
Creatinine, Ser: 0.46 mg/dL (ref 0.44–1.00)
GFR calc Af Amer: >60 mL/min (ref >60)
GFR calc Af Amer: >60 mL/min (ref >60)
GFR calc non Af Amer: >60 mL/min (ref >60)
GLUCOSE: 87 mg/dL (ref 65–99)
Glucose, Bld: 86 mg/dL (ref 65–99)
POTASSIUM: 2.9 mmol/L — AB (ref 3.5–5.1)
Potassium: 2.8 mmol/L — ABNORMAL LOW (ref 3.5–5.1)
SODIUM: 153 mmol/L — AB (ref 135–145)
Sodium: 152 mmol/L — ABNORMAL HIGH (ref 135–145)

## 2016-10-19 LAB — TSH: TSH: 1.122 u[IU]/mL (ref 0.350–4.500)

## 2016-10-19 LAB — CULTURE, GROUP A STREP (THRC)

## 2016-10-19 LAB — PTH, INTACT AND CALCIUM
Calcium, Total (PTH): 12.7 mg/dL — ABNORMAL HIGH (ref 8.7–10.3)
PTH: 12 pg/mL — AB (ref 15–65)

## 2016-10-19 LAB — CALCIUM, IONIZED: Calcium, Ionized, Serum: 7.2 mg/dL — ABNORMAL HIGH (ref 4.5–5.6)

## 2016-10-19 LAB — PREALBUMIN: Prealbumin: 9.9 mg/dL — ABNORMAL LOW (ref 18–38)

## 2016-10-19 MED ORDER — POTASSIUM CHLORIDE 2 MEQ/ML IV SOLN
INTRAVENOUS | Status: DC
  Administered 2016-10-19 – 2016-10-20 (×2): via INTRAVENOUS
  Filled 2016-10-19 (×4): qty 1000

## 2016-10-19 MED ORDER — POTASSIUM CHLORIDE 10 MEQ/100ML IV SOLN: 10.0000 meq | INTRAVENOUS | Status: DC

## 2016-10-19 MED ORDER — POTASSIUM CHLORIDE 10 MEQ/100ML IV SOLN
10.0000 meq | INTRAVENOUS | Status: DC
  Administered 2016-10-19: 10 meq via INTRAVENOUS
  Filled 2016-10-19: qty 100

## 2016-10-19 MED ORDER — POTASSIUM CHLORIDE 10 MEQ/100ML IV SOLN
10.0000 meq | INTRAVENOUS | Status: DC
Start: 2016-10-19 — End: 2016-10-19
  Administered 2016-10-19: 10 meq via INTRAVENOUS
  Filled 2016-10-19: qty 100

## 2016-10-19 MED ORDER — LACTATED RINGERS IV SOLN
INTRAVENOUS | Status: DC
  Administered 2016-10-19: 11:00:00 via INTRAVENOUS

## 2016-10-19 MED FILL — White Petrolatum Gel: Qty: 28.35 | Status: AC

## 2016-10-19 NOTE — Clinical Social Work Note (Signed)
Clinical Social Work Assessment  Patient Details  Name: [name] MRN: [id number] Date of Birth: 1950-09-18  Date of referral:  10/19/16               Reason for consult:  Facility Placement                Permission sought to share information with:  Facility Sport and exercise psychologist, Family Supports Permission granted to share information::  Yes, Verbal Permission Granted  Name::     [facility]  Agency::  SNFs  Relationship::  Sister  Contact Information:  [phone]  Housing/Transportation Living arrangements for the past 2 months:  Apartment Source of Information:  Patient, Other (Comment Required) Patient Interpreter Needed:  None Criminal Activity/Legal Involvement Pertinent to Current Situation/Hospitalization:  No - Comment as needed Significant Relationships:  Siblings Lives with:  Self Do you feel safe going back to the place where you live?  No Need for family participation in patient care:  Yes (Comment)  Care giving concerns:  CSW received consult for possible SNF placement at time of discharge. CSW spoke with patient regarding PT recommendation of SNF placement at time of discharge. Patient provided permission for CSW to speak in front of her sister, [name], at bedside. Patient reported that she lives alone and is having trouble walking. Patient expressed understanding of PT recommendation and is agreeable to SNF placement at time of discharge. CSW to continue to follow and assist with discharge planning needs.   Social Worker assessment / plan:  CSW spoke with patient and her sister concerning possibility of rehab at [facility] before returning home.  Employment status:  Retired Nurse, adult, Medicaid In [place] PT Recommendations:  [place] / Referral to community resources:  [place]  Patient/Family's Response to care:  Patient recognizes need for rehab before returning home and is agreeable to a SNF in  [place]. Patient's sister requested time to process everything that has happened.   Patient/Family's Understanding of and Emotional Response to Diagnosis, Current Treatment, and Prognosis:  Patient/family is realistic regarding therapy needs and expressed being hopeful for SNF placement. Patient expressed understanding of CSW role and discharge process as well as her medical condition though she has questions about tests they will be performing. No questions/concerns about plan or treatment.    Emotional Assessment Appearance:  Appears older than stated age Attitude/Demeanor/Rapport:  Other (Appropriate; quiet ) Affect (typically observed):  Accepting, Appropriate, Quiet Orientation:  Oriented to Self, Oriented to Situation, Oriented to Place, Oriented to  Time Alcohol / Substance use:  Tobacco Use Psych involvement (Current and /or in the community):  No (Comment)  Discharge Needs  Concerns to be addressed:  Care Coordination Readmission within the last 30 days:  No Current discharge risk:  Dependent with Mobility Barriers to Discharge:  Continued Medical Work up   [employer], [place] 10/19/2016, 11:03 AM

## 2016-10-19 NOTE — NC FL2 (Signed)
[place] MEDICAID FL2 LEVEL OF CARE SCREENING TOOL     IDENTIFICATION  Patient Name: [name] Birthdate: [birth year] Sex: female Admission Date (Current Location): 10/17/2016  [facility] and [place] Number:  Herbalist and Address:  [facility] [place] [place] [address], [place], [place] [address]      Provider Number: [id number]  Attending Physician Name and Address:  Oval Linsey, MD  Relative Name and Phone Number:  Stanton Kidney, sister, [phone]    Current Level of Care: Hospital Recommended Level of Care: [place] Prior Approval Number:    Date Approved/Denied:   PASRR Number:    Discharge Plan: SNF    Current Diagnoses: Patient Active Problem List   Diagnosis Date Noted  . Dysphagia 10/17/2016  . Hypercalcemia 10/17/2016  . Sore throat and laryngitis 09/08/2016  . Venous stasis 09/08/2016  . Hypokalemia 09/01/2016  . Pneumonia 08/31/2016  . Pressure ulcer 06/28/2016  . Right thyroid nodule 06/13/2016  . C3 cervical fracture ([place]) 06/11/2016  . Closed displaced intertrochanteric fracture of left femur ([place]) 06/09/2016  . Wrist drop, left 03/10/2016  . Constipation   . Polypharmacy 10/24/2014  . Smoking 10/24/2014  . Trochanteric bursitis of left hip 10/01/2014  . Immunization, tetanus-diphtheria 09/05/2014  . Iron deficiency anemia 11/12/2013  . Health care maintenance 05/14/2013  . Nausea and vomiting 04/17/2013  . Insomnia 03/06/2013  . GERD (gastroesophageal reflux disease) 03/06/2013  . Protein-calorie malnutrition, severe ([place]) 03/04/2013  . Chronic pain 03/02/2013  . Dyslipidemia 02/26/2013  . Seasonal allergies 07/09/2012  . COPD (chronic obstructive pulmonary disease) with emphysema ([place]) 03/01/2011  . Osteoporosis 06/17/2009  . Bipolar I disorder, most recent episode depressed ([place]) 06/02/2009  . Vitamin D deficiency 01/03/2007  . Chronic back pain 12/07/2005    Orientation RESPIRATION BLADDER  Height & Weight     Self, Time, Situation, Place  O2 (Nasal cannula 4L) Incontinent Weight: 35.7 kg (78 lb 11.3 oz) Height:  5\' 1"  (154.9 cm)  BEHAVIORAL SYMPTOMS/MOOD NEUROLOGICAL BOWEL NUTRITION STATUS      Continent Diet (Please see DC Summary)  AMBULATORY STATUS COMMUNICATION OF NEEDS Skin   Extensive Assist Verbally PU Stage and Appropriate Care (Stage II pressure injury on buttocks)                       Personal Care Assistance Level of Assistance  Bathing, Feeding, Dressing Bathing Assistance: Maximum assistance Feeding assistance: Limited assistance Dressing Assistance: Maximum assistance     Functional Limitations Info             SPECIAL CARE FACTORS FREQUENCY  PT (By licensed PT), Speech therapy     PT Frequency: 5x/week       Speech Therapy Frequency: 2x/week      Contractures      Additional Factors Info  Code Status, Allergies Code Status Info: DNR Allergies Info: Banana, Lyrica Pregabalin, Pollen Extract           Current Medications (10/19/2016):  This is the current hospital active medication list Current Facility-Administered Medications  Medication Dose Route Frequency Provider Last Rate Last Dose  . albuterol (PROVENTIL) (2.5 MG/3ML) 0.083% nebulizer solution 2.5 mg  2.5 mg Inhalation Q4H PRN Rice, Resa Miner, MD      . bacitracin ointment 1 application  1 application Topical BID Jodi Marble, MD   1 application at 25/85/27 2259  . enoxaparin (LOVENOX) injection 30 mg  30 mg Subcutaneous Daily Rice, Resa Miner,  MD   30 mg at 10/18/16 1046  . fluticasone (FLONASE) 50 MCG/ACT nasal spray 2 spray  2 spray Each Nare Daily Collier Salina, MD   2 spray at 10/18/16 1046  . gi cocktail (Maalox,Lidocaine,Donnatal)  30 mL Oral Once Quintella Reichert, MD      . ipratropium-albuterol (DUONEB) 0.5-2.5 (3) MG/3ML nebulizer solution 3 mL  3 mL Nebulization QID Oval Linsey, MD   3 mL at 10/19/16 0941  . lactated ringers infusion    Intravenous Continuous Nedrud, Larena Glassman, MD 100 mL/hr at 10/19/16 1111    . lidocaine (XYLOCAINE) 2 % jelly 1 application  1 application Topical Once PRN Jodi Marble, MD      . lidocaine (XYLOCAINE) 4 % external solution 0-50 mL  0-50 mL Topical Once PRN Jodi Marble, MD      . lidocaine-EPINEPHrine (XYLOCAINE-EPINEPHrine) 1 %-1:200000 (PF) injection 0-30 mL  0-30 mL Intradermal Once PRN Jodi Marble, MD      . mometasone-formoterol [facility] [place]) 200-5 MCG/ACT inhaler 2 puff  2 puff Inhalation BID Collier Salina, MD   2 puff at 10/19/16 0941  . morphine 4 MG/ML injection 4 mg  4 mg Intravenous Q4H PRN Collier Salina, MD   4 mg at 10/18/16 1058  . ondansetron (ZOFRAN) tablet 4 mg  4 mg Oral Q6H PRN Rice, Resa Miner, MD       Or  . ondansetron [facility]) injection 4 mg  4 mg Intravenous Q6H PRN Rice, Resa Miner, MD      . oxymetazoline (AFRIN) 0.05 % nasal spray 1 spray  1 spray Each Nare Once PRN Jodi Marble, MD      . pantoprazole (PROTONIX) injection 40 mg  40 mg Intravenous QHS Collier Salina, MD   40 mg at 10/18/16 2241  . piperacillin-tazobactam (ZOSYN) IVPB 3.375 g  3.375 g Intravenous Q8H Hammons, Theone Murdoch, RPH   Stopped at 10/19/16 1112  . potassium chloride 10 mEq in 100 mL IVPB  10 mEq Intravenous Q1H Nedrud, Marybeth, MD      . silver nitrate applicators applicator 1 Stick  1 Stick Topical Once PRN Jodi Marble, MD      . TRIPLE ANTIBIOTIC [phone] OINT 1 application  1 application Apply externally Once PRN Jodi Marble, MD      . white petrolatum (VASELINE) gel 1 application  1 application Topical Daily Collier Salina, MD   1 application at 00/92/33 1046     Discharge Medications: Please see discharge summary for a list of discharge medications.  Relevant Imaging Results:  Relevant Lab Results:   Additional Information ssn: [id number]  Benard Halsted, LCSWA

## 2016-10-19 NOTE — Progress Notes (Addendum)
Subjective:  [name] was seen laying comfortably in her bed on rounds this morning. She was more interactive today and states that she feels a little better. She was able to speak more this AM and participate with further discussions regarding her care. She has her sister bedside with many questions regarding her progonsis and treatment.   Objective:  Vital signs in last 24 hours: Vitals:   10/18/16 1929 10/18/16 1940 10/18/16 2212 10/19/16 0537  BP:   (!) 168/81 (!) 171/76  Pulse:   70 72  Resp:   17 17  Temp:   98.1 F (36.7 C) 98.2 F (36.8 C)  TempSrc:   Oral Oral  SpO2: 97% 97% 97% 99%  Weight:      Height:       Physical Exam  Constitutional:  Cachectic appearing woman in no acute distress.  HENT:  Oropharynx dry. Sunken eyes. Interval improvement from yesterday's exam.  Cardiovascular: Normal rate, regular rhythm, normal heart sounds and intact distal pulses.   No murmur heard. Pulmonary/Chest: She has no wheezes.  Continued gurgling throughout exam. Decreased pulmonary effort made exam difficult. Upper airway noise transmitted on exam.   Abdominal: Soft. She exhibits no distension. There is no tenderness.  Musculoskeletal: She exhibits no edema (of bilateral lower extremities) or tenderness (of bilateral lower extremities).  Skin: Skin is dry. No rash noted. No erythema. There is pallor.  Interval improvement in dryness of lips and sunken eyes. Patient still has severely dry, cracked lips.   Assessment/Plan:  Principal Problem:   Dysphagia Active Problems:   Vitamin D deficiency   Chronic back pain   Osteoporosis   Bipolar I disorder, most recent episode depressed (HCC)   COPD (chronic obstructive pulmonary disease) with emphysema (HCC)   Protein-calorie malnutrition, severe (HCC)   GERD (gastroesophageal reflux disease)   Iron deficiency anemia   Smoking   Pressure ulcer   Pneumonia   Hypokalemia   Sore throat and laryngitis   Hypercalcemia  Jordan  [name] is a 66 yo with a PMH of COPD on 3L home oxygen, tobacco abuse, and HCAP on 08/31/2016 who presented to the ED on 10/17/2016 with progressive dysphagia for 3 months, shortness of breath, and hoarseness. 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 Dr Erik Obey suggests goals of care discussion, as patient would need anesthesia for ENT biopsy or g-tube placement. She is not a good candidate for anesthesia. They recommended IR biopsy and Rad-Onc for tissue diagnosis/palliative radiation. -Consulted IR for possible biopsy of supraclavicular lymph node on 8/8 -Consult to radiation oncology to discuss palliative radiation on 8/8; patient will be seen this afternoon. -Considering consultation to hospice/palliative care pending further family discussion.  -Patient told me she does not want G-Tube placed. Remains DNR.  -Patient NPO pending further goals of care discussions -Continue pain management with 4mg  morphine, q4 hours PRN  Aspiration pneumonia in setting of severe emphysema: The patient had a CXR showing a new RLL consolidation which, in the setting of progressive dysphagia, is concerning for aspiration. WBC on admission 12.7. The patient also has a L lung base opacity which had interval improvement from her previous CXR on 08/31/16 during her hospitalization for HCAP. She also had evidence of advanced emphysema on this study and on the CT of neck.  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 Ca2+ on HD#2 was 11.0 and calcitonin was discontinued. She is s/p 1 dose of Zolendronic acid, 3 mg on 8/7. Will have to dose this medication weekly.   Hypokalemia with metabolic alkalosis: Patient had K = 2.6 on admission; VBG showed metabolic acidosis. Given the patient's clinical history this is likely contraction alkalosis. Patient continue's to have metabolic disturbances this AM. We will switch to LR for volume resuscitation and replenish K+ with 50mEq over 1 hour x 8 doses.  -LR @100  ml/hr -IV K+ 10 mEq over 1 hour x 8 doses -> Patient does not tolerate K over 1 hour 2/2 burning sensation, will move to over 2 hours -BMP at 6 pm on 8/8 and 5 am on 8/9  GERD: -Pantoprazole 40 mg   VTE prophylaxis: -Lovenox 30 mg daily  Dispo: Anticipated discharge pending clinical improvement.   Thomasene Ripple, MD 10/19/2016, 6:52 AM Pager: [phone]

## 2016-10-19 NOTE — Progress Notes (Addendum)
Internal Medicine Attending  Date: 10/19/2016  Patient name: ARLYCE CIRCLE Medical record number: [id number] Date of birth: 22-Dec-1950 Age: 66 y.o. Gender: female  I saw and evaluated the patient. I reviewed the resident's note by Dr. Berneice Gandy and I agree with the resident's findings and plans as documented in her progress note.  When seen on rounds this morning Ms. Reade was much more interactive and not only looked slightly improved, but felt better. We appreciate the input from ENT and radiation oncology. Dr. Jerold Coombe helped to clarify Ms. Metzer's goals of care and at this point it looks like she is leaning towards twice daily palliative radiation to allow some oral intake, with hospice care thereafter. We are continuing to volume resuscitate and are switching to lactated Ringer's given the hypernatremia. When she has been volume resuscitated we will switch to replacing her free water deficit. Her calcium is slowly coming down and this is associated with the improvement in her mental status. Radiation oncology has consulted Palliative Care. In the meantime, we will continue with the opiates for her pain.  For her acute on chronic hypoxic respiratory failure secondary to the aspiration pneumonia we will continue the antibiotics, supplemental oxygen, and bronchodilators.

## 2016-10-19 NOTE — Evaluation (Signed)
Occupational Therapy Evaluation Patient Details Name: [name] MRN: [id number] DOB: 02-Aug-1950 Today's Date: 10/19/2016    History of Present Illness 66 y.o. female admitted on 10/17/2016 with pneumonia; history of COPD, chronic pain syndrome, osteoporosis, bipolar 1 disorder   Clinical Impression   Pt was living alone using a rollator or cane and struggling to care for herself prior to admission. Pt presents with significant weakness, poor balance and decreased activity tolerance. She requires min to total assist for ADL and mod to max assist for mobility. She is unable to walk. Will follow acutely, recommending SNF upon d/c. Per sister, her priority is to smoke and pt will want a facility which allows.    Follow Up Recommendations  SNF;Supervision/Assistance - 24 hour    Equipment Recommendations       Recommendations for Other Services       Precautions / Restrictions Precautions Precautions: Fall Restrictions Weight Bearing Restrictions: No      Mobility Bed Mobility Overal bed mobility: Needs Assistance Bed Mobility: Supine to Sit;Sit to Supine     Supine to sit: Max assist Sit to supine: Max assist   General bed mobility comments: pt able to initiate movement using rail, but requires assist for all aspects using bed pad  Transfers Overall transfer level: Needs assistance   Transfers: Sit to/from Stand;Stand Pivot Transfers Sit to Stand: Mod assist Stand pivot transfers: Mod assist;Max assist       General transfer comment: assist to rise and for balance    Balance Overall balance assessment: Needs assistance   Sitting balance-Leahy Scale: Fair       Standing balance-Leahy Scale: Poor Standing balance comment: heavy reliance on assist                           ADL either performed or assessed with clinical judgement   ADL Overall ADL's : Needs assistance/impaired Eating/Feeding: Bed level;Minimal assistance Eating/Feeding Details  (indicate cue type and reason): ice chips Grooming: Brushing hair;Bed level;Total assistance   Upper Body Bathing: Total assistance;Sitting   Lower Body Bathing: Total assistance;Bed level   Upper Body Dressing : Maximal assistance;Bed level   Lower Body Dressing: Total assistance;Bed level   Toilet Transfer: Moderate assistance;Stand-pivot;BSC   Toileting- Clothing Manipulation and Hygiene: Total assistance;Sit to/from stand;+2 for physical assistance       Functional mobility during ADLs:  (not able to ambulate) General ADL Comments: Very poor activity tolerance.     Vision Baseline Vision/History: Wears glasses Wears Glasses: At all times Patient Visual Report: No change from baseline       Perception     Praxis      Pertinent Vitals/Pain Pain Assessment: Faces Faces Pain Scale: Hurts even more Pain Location: all over Pain Descriptors / Indicators: Grimacing;Guarding Pain Intervention(s): Monitored during session;Repositioned     Hand Dominance Right   Extremity/Trunk Assessment Upper Extremity Assessment Upper Extremity Assessment: Generalized weakness   Lower Extremity Assessment Lower Extremity Assessment: Defer to PT evaluation   Cervical / Trunk Assessment Cervical / Trunk Assessment: Kyphotic (frail)   Communication Communication Communication: Expressive difficulties   Cognition Arousal/Alertness: Awake/alert Behavior During Therapy: WFL for tasks assessed/performed Overall Cognitive Status: Within Functional Limits for tasks assessed                                     General Comments  Exercises     Shoulder Instructions      Home Living Family/patient expects to be discharged to:: Skilled nursing facility Living Arrangements: Alone Available Help at Discharge: Friend(s);Available PRN/intermittently Type of Home: Apartment Home Access: Level entry     Home Layout: One level     Bathroom Shower/Tub:  Teacher, early years/pre: Handicapped height     Home Equipment: Environmental consultant - 4 wheels;Shower seat;Other (comment);Grab bars - tub/shower;Grab bars - toilet (oxygen, typically 3L)          Prior Functioning/Environment Level of Independence: Needs assistance  Gait / Transfers Assistance Needed: Used rollator  ADL's / Homemaking Assistance Needed: sponge bathed, struggled with IADL and LB bathing and dressing   Comments: pt reported that she ambulates with use of a SPC or rollator        OT Problem List: Decreased strength;Decreased activity tolerance;Impaired balance (sitting and/or standing);Decreased knowledge of use of DME or AE;Cardiopulmonary status limiting activity;Pain      OT Treatment/Interventions: Self-care/ADL training;DME and/or AE instruction;Therapeutic activities;Patient/family education;Balance training    OT Goals(Current goals can be found in the care plan section) Acute Rehab OT Goals Patient Stated Goal: none stated OT Goal Formulation: With patient Time For Goal Achievement: 11/02/16 Potential to Achieve Goals: Fair ADL Goals Pt Will Perform Grooming: with min assist;sitting Pt Will Perform Upper Body Dressing: with min assist;sitting Pt Will Transfer to Toilet: with min assist;stand pivot transfer;bedside commode Additional ADL Goal #1: Pt will perform bed mobility with minimal assist.   OT Frequency: Min 2X/week   Barriers to D/C: Decreased caregiver support          Co-evaluation              AM-PAC PT "6 Clicks" Daily Activity     Outcome Measure Help from another person eating meals?: A Lot Help from another person taking care of personal grooming?: Total Help from another person toileting, which includes using toliet, bedpan, or urinal?: Total Help from another person bathing (including washing, rinsing, drying)?: Total Help from another person to put on and taking off regular upper body clothing?: A Lot Help from another  person to put on and taking off regular lower body clothing?: Total 6 Click Score: 8   End of Session Equipment Utilized During Treatment: Gait belt;Oxygen Nurse Communication: Mobility status  Activity Tolerance: Patient limited by fatigue;Patient limited by pain Patient left: in bed;with call bell/phone within reach;with nursing/sitter in room;with family/visitor present  OT Visit Diagnosis: Unsteadiness on feet (R26.81);Muscle weakness (generalized) (M62.81);Pain;Feeding difficulties (R63.3)                Time: 5597-4163 OT Time Calculation (min): 37 min Charges:  OT General Charges $OT Visit: 1 Procedure OT Evaluation $OT Eval Moderate Complexity: 1 Procedure OT Treatments $Self Care/Home Management : 8-22 mins G-Codes:     Malka So 10/19/2016, 11:51 AM  [phone]

## 2016-10-19 NOTE — Progress Notes (Signed)
10/19/2016 10:24 AM  Shirlyn Goltz [id number]    Temp:  [97.8 F (36.6 C)-98.2 F (36.8 C)] 98.2 F (36.8 C) (08/08 0537) Pulse Rate:  [67-72] 72 (08/08 0537) Resp:  [16-17] 17 (08/08 0537) BP: (162-171)/(64-81) 171/76 (08/08 0537) SpO2:  [97 %-99 %] 99 % (08/08 0537),     Intake/Output Summary (Last 24 hours) at 10/19/16 1024 Last data filed at 10/19/16 0631  Gross per 24 hour  Intake          1383.33 ml  Output                0 ml  Net          1383.33 ml    Results for orders placed or performed during the hospital encounter of 10/17/16 (from the past 24 hour(s))  Basic metabolic panel     Status: Abnormal   Collection Time: 10/19/16  4:04 AM  Result Value Ref Range   Sodium 153 (H) 135 - 145 mmol/L   Potassium 2.9 (L) 3.5 - 5.1 mmol/L   Chloride 104 101 - 111 mmol/L   CO2 38 (H) 22 - 32 mmol/L   Glucose, Bld 86 65 - 99 mg/dL   BUN 10 6 - 20 mg/dL   Creatinine, Ser 0.46 0.44 - 1.00 mg/dL   Calcium 11.0 (H) 8.9 - 10.3 mg/dL   GFR calc non Af Amer >60 >60 mL/min   GFR calc Af Amer >60 >60 mL/min   Anion gap 11 5 - 15  TSH     Status: None   Collection Time: 10/19/16  7:13 AM  Result Value Ref Range   TSH 1.122 0.350 - 4.500 uIU/mL  Prealbumin     Status: Abnormal   Collection Time: 10/19/16  7:13 AM  Result Value Ref Range   Prealbumin 9.9 (L) 18 - 38 mg/dL   I reviewed her neck CT scan with Dr. Jobe Igo, [facility].  Large proximal esophageal cancer.  Bilateral supraclavicular and mediastinal nodes, necrotic.   SUBJECTIVE:  Obtained more history from pt and sister.  Pt with severe COPD, chronic sciatica, decubitus ulcer.  Went from assisted living to home, fell and fractured hip promptly.  Odynophagia and dysphagia ~3 mos.    OBJECTIVE:  Sl more phonatory and awake this AM.    IMPRESSION:  Presumed esophageal cancer.  Not likely curable.  Severe malnutrition.  PLAN:  Spent>30 minutes discussing with pt, sister, and Dr. Berneice Gandy.  Options include:  1) no  treatment whatsoever, with very short anticipated lifespan, she could even possible return to her apartment if this is the election, 2) Hospice terminal care, 3) palliation with analgesics, 4) palliation with Radiation.  If any treatment is elected, we will need an nutritional access and probably a tissue diagnosis.  I would ask Interventional Radiology to consider a needle aspiration of one of the supraclavicular nodes.  If I were to obtain an esophageal biopsy, or if she needs a gastrostomy tube, she would need anesthesia for which she is not currently a very good candidate.  I would consult RT, IR, Hospice.  Sister will call her Doristine Bosworth for some spiritual support. I answered questions.  I am available to help with questions or logistics as needed.    Thanks,  Jodi Marble

## 2016-10-19 NOTE — Consult Note (Addendum)
Radiation Oncology         [phone] ________________________________  Initial inpatient Consultation  Name: [name] MRN: [id number]  Date of Service: 10/19/16 DOB: 02/26/1951  WU:[id number], Verne Spurr, MD  No ref. provider found   DIAGNOSIS: The primary encounter diagnosis was Sore throat. Diagnoses of SOB (shortness of breath), Pneumonia, and Enlarged lymph node were also pertinent to this visit.    ICD-10-CM   1. Sore throat J02.9   2. SOB (shortness of breath) R06.02   3. Pneumonia J18.9 DG Chest 2 View    DG Chest 2 View  4. Enlarged lymph node R59.9 IR Radiologist Eval & Mgmt    IR Radiologist Eval & Mgmt    HISTORY OF PRESENT ILLNESS: [name] is a 66 y.o. female seen at the request of Dr. Berneice Gandy with the inpatient teaching service for a probable advanced head and neck versus cervical esophageal cancer. The patient has had a past medical history significant for chronic pain, asthma, chronic ongoing tobacco use, O2 dependant COPD, and bipolar disorder. She was in her usual state of health until about 3 months ago. She started having episodes of falls and dysphagia. She developed compression fractures in the C spine, and fell and fractured her left femur. She underwent surgical repair of this on 06/10/16. Interestingly she had some soft tissue versus mucous in her trachea at that point in time on a CT neck. It's unclear if she had any further instruction on workup of this. She also had an endoscopy in August 2016 that did not reveal any esophageal tumors. She presented on 10/17/16 with progressive dysphagia, and stridor and CT neck has revealed a large mass that was consistent with nodal conglomerate in the upper mediastinum near the thoracic inlet, posterior to the trachea and thyroid measuring 43 x 54 x 48 mm. There was necrosis and adjacent right supraclavicular nodal mass measuring 18 x 28 mm. A left supraclavicular nodal mass measuring 17 x 22 mm as also seen along with  scattered adenopathy in the upper mediastinum, right paratracheal, pretracheal, and prevascular nodes, the larges measured 21 x 19 mm. The upper chest along the posterior left lower lobe revealed irregular pleural thickening as well as severe emphysema bilaterally. She is not a candidate to undergo endoscopy or laryngoscopy without anesthesia, and Dr. Erik Obey is concerned about her risks of anesthesia. She has a pending consult for IR to consider CT guided biopsy of one of the supraclavicular nodes. We're asked to discuss options of palliative radiotherapy.  PREVIOUS RADIATION THERAPY: No  PAST MEDICAL HISTORY:  Past Medical History:  Diagnosis Date  . Anemia   . Anxiety   . Arthritis    "all in my body, primarily in my hands" (08/31/2016)  . Bipolar disorder ([place])   . Chronic lower back pain   . Chronic pain syndrome    follows at [facility] pain [facility]  . COPD (chronic obstructive pulmonary disease) (HCC)    oxygen dependent (3L home continuous)  . DEFICIENCY, VITAMIN D NOS 01/03/2007  . Depression   . Duodenal ulcer, chronic   . Elevated liver function tests   . HCAP (healthcare-associated pneumonia) 08/31/2016  . History of blood transfusion 03/2014; 10/2014   "low HgB" (11/11/2014)  . Hyperthyroidism    "borderline"  . On home oxygen therapy    "3L; 24/7" (08/31/2016)  . OSTEOPOROSIS 06/17/2009   DEXA 05/2009 : L femur -2.9; R femur -2.5. Alendronate on med list but not taking. Needs addressed ASAP  as h/o fractures.    . Pneumonia X 1?  . Sciatic pain   . Shortness of breath    "all the time" (02/27/2013)  . Substance abuse     narcotics, alcohol, tobacco  . Suicidal ideation 2007    attempted overdose 2012  Dr. Tivis Ringer report      PAST SURGICAL HISTORY: Past Surgical History:  Procedure Laterality Date  . ANKLE DEBRIDEMENT Left 10/2010  . AUGMENTATION MAMMAPLASTY Bilateral ~ 2007  . CATARACT EXTRACTION W/ INTRAOCULAR LENS  IMPLANT, BILATERAL Bilateral   . CESAREAN SECTION   1974; 1979  . ESOPHAGOGASTRODUODENOSCOPY N/A 11/07/2014   Procedure: ESOPHAGOGASTRODUODENOSCOPY (EGD);  Surgeon: Irene Shipper, MD;  Location: [facility] ENDOSCOPY;  Service: Endoscopy;  Laterality: N/A;  . FRACTURE SURGERY    . INTRAMEDULLARY (IM) NAIL INTERTROCHANTERIC Left 06/10/2016   Procedure: LEFT FEMUR INTRAMEDULLARY (IM) NAIL INTERTROCHANTRIC;  Surgeon: Leandrew Koyanagi, MD;  Location: [facility];  Service: Orthopedics;  Laterality: Left;  . ORIF ANKLE FRACTURE Left 10/2010  . TUBAL LIGATION  1979    FAMILY HISTORY:  Family History  Problem Relation Age of Onset  . Myelodysplastic syndrome Father        Died from myelofibrosis though diagnosed post-mortem  . Stroke Neg Hx   . Cancer Neg Hx     SOCIAL HISTORY:  Social History   Social History  . Marital status: Divorced    Spouse name: N/A  . Number of children: 2  . Years of education: N/A   Occupational History  . retired Unemployed   Social History Main Topics  . Smoking status: Current Every Day Smoker    Packs/day: 0.10    Years: 48.00    Types: Cigarettes  . Smokeless tobacco: Never Used     Comment: Patient doesn't want to quit  . Alcohol use No     Comment: 08/31/2016  "nothing in 10 years; never had a problem w/it"  . Drug use: No  . Sexual activity: No   Other Topics Concern  . 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 albuterol (PROVENTIL) (2.5 MG/3ML) 0.083% nebulizer solution 2.5 mg  2.5 mg Inhalation Q4H PRN Rice, Resa Miner, MD      . bacitracin ointment 1 application  1 application Topical BID Jodi Marble, MD   1  application at 36/14/43 1000  . enoxaparin (LOVENOX) injection 30 mg  30 mg Subcutaneous Daily Collier Salina, MD   30 mg at 10/18/16 1046  . fluticasone (FLONASE) 50 MCG/ACT nasal spray 2 spray  2 spray Each Nare Daily Collier Salina, MD   2 spray at 10/19/16 1120  . gi cocktail (Maalox,Lidocaine,Donnatal)  30 mL Oral Once Quintella Reichert, MD      . ipratropium-albuterol (DUONEB) 0.5-2.5 (3) MG/3ML nebulizer solution 3 mL  3 mL Nebulization QID Oval Linsey, MD   3 mL at 10/19/16 1214  . lactated ringers infusion   Intravenous Continuous Nedrud, Larena Glassman, MD 100 mL/hr at 10/19/16 1111    . lidocaine (XYLOCAINE) 2 %  jelly 1 application  1 application Topical Once PRN Jodi Marble, MD      . lidocaine (XYLOCAINE) 4 % external solution 0-50 mL  0-50 mL Topical Once PRN Jodi Marble, MD      . lidocaine-EPINEPHrine (XYLOCAINE-EPINEPHrine) 1 %-1:200000 (PF) injection 0-30 mL  0-30 mL Intradermal Once PRN Jodi Marble, MD      . mometasone-formoterol [facility]) 200-5 MCG/ACT inhaler 2 puff  2 puff Inhalation BID Collier Salina, MD   2 puff at 10/19/16 0941  . morphine 4 MG/ML injection 4 mg  4 mg Intravenous Q4H PRN Collier Salina, MD   4 mg at 10/19/16 1210  . ondansetron (ZOFRAN) tablet 4 mg  4 mg Oral Q6H PRN Rice, Resa Miner, MD       Or  . ondansetron [facility]) injection 4 mg  4 mg Intravenous Q6H PRN Rice, Resa Miner, MD      . oxymetazoline (AFRIN) 0.05 % nasal spray 1 spray  1 spray Each Nare Once PRN Jodi Marble, MD      . pantoprazole (PROTONIX) injection 40 mg  40 mg Intravenous QHS Collier Salina, MD   40 mg at 10/18/16 2241  . piperacillin-tazobactam (ZOSYN) IVPB 3.375 g  3.375 g Intravenous Q8H Hammons, Theone Murdoch, RPH   Stopped at 10/19/16 1112  . potassium chloride 10 mEq in 100 mL IVPB  10 mEq Intravenous Q1H Nedrud, Marybeth, MD      . silver nitrate applicators applicator 1 Stick  1 Stick Topical Once PRN Jodi Marble, MD      . TRIPLE  ANTIBIOTIC [phone] OINT 1 application  1 application Apply externally Once PRN Jodi Marble, MD      . white petrolatum (VASELINE) gel 1 application  1 application Topical Daily Collier Salina, MD   1 application at 49/67/59 1121    REVIEW OF SYSTEMS:  On review of systems, the patient is unable to communicate well due to her dysphagia and sore throat. She's alert and oriented to person, self, time, and place. Her sister notes that she's having trouble swallowing most liquids and solids for quite some time and has lost a significant amount of weight in the past few months. She has not complained of other pain but has a productive cough and has had trouble clearing her secretions. Her sister notes she has been very tired and fatigued in the last few days. No other complaints are noted.    PHYSICAL EXAM:  Wt Readings from Last 3 Encounters:  10/17/16 78 lb 11.3 oz (35.7 kg)  09/27/16 78 lb 9.6 oz (35.7 kg)  08/31/16 90 lb (40.8 kg)   Temp Readings from Last 3 Encounters:  10/19/16 98.2 F (36.8 C) (Oral)  09/27/16 98.6 F (37 C) (Oral)  09/08/16 98 F (36.7 C) (Oral)   BP Readings from Last 3 Encounters:  10/19/16 (!) 171/76  09/27/16 133/60  09/08/16 (!) 126/59   Pulse Readings from Last 3 Encounters:  10/19/16 72  09/27/16 80  09/08/16 83   Pain Assessment Pain Score: 10-Worst pain ever/10  In general this is a chronically ill appearing Caucasian female in no acute distress. She is alert and oriented x4 and appropriate throughout the examination. HEENT reveals that the patient is normocephalic, atraumatic. EOMs are intact. PERRLA. Skin is intact without any evidence of gross lesions. Cardiovascular exam reveals a regular rate and rhythm, no clicks rubs or murmurs are auscultated. Chest is clear to auscultation bilaterally. Lymphatic assessment is performed and  does not reveal any adenopathy in the cervical, supraclavicular, axillary, or inguinal chains. Abdomen has  active bowel sounds in all quadrants and is intact. The abdomen is soft, non tender, non distended. Lower extremities are negative for pretibial pitting edema, deep calf tenderness, cyanosis or clubbing.   KPS = 20  100 - Normal; no complaints; no evidence of disease. 90   - Able to carry on normal activity; minor signs or symptoms of disease. 80   - Normal activity with effort; some signs or symptoms of disease. 67   - Cares for self; unable to carry on normal activity or to do active work. 60   - Requires occasional assistance, but is able to care for most of his personal needs. 50   - Requires considerable assistance and frequent medical care. 36   - Disabled; requires special care and assistance. 41   - Severely disabled; hospital admission is indicated although death not imminent. 17   - Very sick; hospital admission necessary; active supportive treatment necessary. 10   - Moribund; fatal processes progressing rapidly. 0     - Dead  Karnofsky DA, Abelmann Med[place], Craver LS and Burchenal [facility] [phone]) The use of the nitrogen mustards in the palliative treatment of carcinoma: with particular reference to bronchogenic carcinoma Cancer 1 634-56  LABORATORY DATA:  Lab Results  Component Value Date   WBC 9.7 10/18/2016   HGB 10.9 (L) 10/18/2016   HCT 38.2 10/18/2016   MCV 86.2 10/18/2016   PLT 651 (H) 10/18/2016   Lab Results  Component Value Date   NA 153 (H) 10/19/2016   K 2.9 (L) 10/19/2016   CL 104 10/19/2016   CO2 38 (H) 10/19/2016   Lab Results  Component Value Date   ALT 13 (L) 10/18/2016   AST 17 10/18/2016   ALKPHOS 113 10/18/2016   BILITOT 0.8 10/18/2016     RADIOGRAPHY: Dg Chest 2 View  Result Date: 10/18/2016 CLINICAL DATA:  66 year old female with a history of shortness of breath EXAM: CHEST  2 VIEW COMPARISON:  06/09/2016, 08/31/2016, 10/17/2016 FINDINGS: Cardiomediastinal silhouette unchanged in size and contour. Stigmata of emphysema, with increased retrosternal  airspace, flattened hemidiaphragms, increased AP diameter, and hyperinflation on the AP view. Similar appearance of lucency of the upper lobes, compatible with lung bulla formation. Patchy airspace opacity of the bilateral, right greater than left mid and lower lungs. No evidence of large pleural effusion. No displaced fracture. Accentuated kyphotic curvature. Density of the chest wall anteriorly, compatible with breast reconstruction/augmentation. IMPRESSION: Similar appearance of bilateral airspace disease, compatible with multifocal pneumonia superimposed on advanced emphysema. As was previously suggested, follow-up PA and lateral chest x-ray in 4-6 weeks is recommended after treatment. Electronically Signed   By: Corrie Mckusick D.O.   On: 10/18/2016 10:23   Dg Chest 2 View  Result Date: 10/17/2016 CLINICAL DATA:  Cough EXAM: CHEST  2 VIEW COMPARISON:  08/31/2016 chest radiograph. FINDINGS: Stable cardiomediastinal silhouette with normal heart size. No pneumothorax. No pleural effusion. Severely hyperinflated lungs with advanced emphysema. New patchy consolidation throughout the right lower lobe. Mild patchy opacity at the left lung base is improved since 08/31/2016. IMPRESSION: 1. New patchy consolidation throughout the right lower lobe. Mild patchy left lung base opacity is improved since 08/31/2016 chest radiograph. These findings are favored to represent a new multilobar pneumonia. Recommend follow-up PA and lateral post treatment chest radiographs in 4-6 weeks. 2. Severely hyperinflated lungs with advanced emphysema, compatible with COPD. Electronically Signed   By:  Ilona Sorrel M.D.   On: 10/17/2016 18:37   Ct Soft Tissue Neck W Contrast  Result Date: 10/18/2016 CLINICAL DATA:  Initial evaluation for acute sore throat, stridor. EXAM: CT NECK WITH CONTRAST TECHNIQUE: Multidetector CT imaging of the neck was performed using the standard protocol following the bolus administration of intravenous contrast.  CONTRAST:  23mL ISOVUE-300 IOPAMIDOL (ISOVUE-300) INJECTION 61% COMPARISON:  None. FINDINGS: Pharynx and larynx: Oral cavity within normal limits without mass lesion or loculated fluid collection. No acute abnormality about the dentition. Palatine tonsils symmetric and within normal limits bilaterally. Parapharyngeal fat preserved. Nasopharynx normal. Retropharyngeal soft tissues within normal limits. Remainder of the hypopharynx and supraglottic larynx within normal limits. Epiglottis normal. Vallecula clear. True cords symmetric and normal. Subglottic trachea somewhat displaced anteriorly but remains widely patent. Salivary glands: Salivary glands including the parotid and submandibular glands are normal. Thyroid: 9 mm hypodense right thyroid nodule, of doubtful significance. Thyroid otherwise unremarkable. Lymph nodes: Patient is somewhat cachectic. There is extensive heterogeneous mass like opacity positioned at the upper mediastinum near the thoracic inlet, posterior to the trachea and thyroid (series 3, image 109). This measures approximately 4.3 x 5.4 x 4.8 cm, and is felt to be consistent with a large nodal conglomerate. Evidence for associated necrosis throughout this area. Adjacent right supraclavicular necrotic nodal mass measures 1.8 x 2.8 cm (series 3, image 108). Left supraclavicular necrotic nodal mass measures 1.7 x 2.2 cm (series 3, image 107). Scattered necrotic adenopathy extends into the upper mediastinum. Right peritracheal nodes measure up to 1.7 cm (series 3, image 124). Pretracheal node measures 1.4 cm (series 3, image 124). Prevascular nodes measure up to 2.1 x 1.9 cm (series 3, image 132). Finding concerning for nodal metastases. There is secondary mass effect on the subglottic trachea which is displaced anteriorly by remains patent. No conglomerate abuts the posterior surface of the thyroid without thyroidal invasion. Nodal mass surrounds the upper esophagus which is poorly visualized.  Suspected secretions/debris noted within the upper esophageal lumen at this level (series 3, image 119). Possible esophageal invasion not entirely excluded. No other discrete adenopathy seen superiorly within the neck. Vascular: Normal intravascular enhancement seen throughout the neck. Vascular calcifications about the left carotid bifurcation. Nodal mass abuts the medial aspects of the common carotid artery's bilaterally. Additionally, nodal mass partially encompasses the proximal right V1 segment (series 3, image 151). Limited intracranial: Unremarkable. Visualized orbits: Globes and orbital soft tissues within normal limits. Mastoids and visualized paranasal sinuses: Paranasal sinuses are clear. Small osteoma noted at the right ethmoidal air cells. Mastoid air cells and middle ear cavities are clear. Skeleton: No acute osseus abnormality. No worrisome lytic or blastic osseous lesions. Upper chest: Severe emphysema partially visualized. Linear scarring and/or atelectasis partially visualize within the posterior left lower lobe. Irregular pleural thickening noted at this level as well. IMPRESSION: 1. Extensive necrotic adenopathy involving the lower neck/upper chest, bilateral supraclavicular regions, and upper mediastinum as above, most concerning for nodal metastases. Nodal disease Sir brown scanning compresses the upper esophagus, with possible esophageal invasion not entirely excluded. Subglottic trachea displaced anteriorly but remains patent. 2. Severe emphysema with irregular pleuroparenchymal scarring and thickening at the left lower lobe. Further evaluation with dedicated cross-sectional imaging of the chest recommended. Electronically Signed   By: Jeannine Boga M.D.   On: 10/18/2016 00:16      IMPRESSION/PLAN: 1. 66 y.o. 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 The patient and her sister are counseled on the radiographic findings  from her CT neck. It appears she  had some soft tissue mass versus mucous in her previous MRI of the cervical spine in March 2018. Now it appears that the process has advanced involving multiple nodes in the the cervical region. We discussed the findings and the typical process for patients who are medically able to undergo panendoscopy, however her medical team is concerned about her comorbidities in approaching this without anesthesia, and that she may have poor outcome due to concerns about putting her through anesthesia. As a result there is an IR consult pending for biopsy. We discussed that having the biopsy would not necessarily change our approach to offering her palliative radiotherapy, however if she wanted to know the type of cancer she has, this would be reasonable to consider. I feel that her poor performance status would keep her from being a candidate for curative treatment. We spent time discussing options for pursuing hospice care alone versus palliative radiotherapy as an inpatient with a BID treatment for 2 days. We discussed the risks, benefits, short, and long term effects of therapy. At the end of the conversation, the patient and her sister are interested in meeting with palliative care. I've called a consult and informed the primary team as well. She will consider her options and let us know how she'd like to proceed. I did stress that palliative radiotherapy goals would be to improve her swallowing but not her overall prognosis, and knowledge of her prognosis is limited based on the unknown type of disease and extent of disease. The patient does state she'd like to receive hospice care and seems amenable to placement at [facility]. She will discuss this further with palliative care and case management.  2. Hypercalcemia in the setting of probable malignancy, and probable aspiration pneumonia. The patient will continue under the direction for this with her primary care team. 3. Code status. The patient is DNR.   In a  visit lasting 70 minutes, greater than 50% of the time was spent face to face discussing the findings and clinical concerns regarding a probable advanced cancer diagnosis, and in floor time, coordinating the patient's care.     Carola Rhine, PAC Seen on behalf of though discussed with Sheral Apley. Tammi Klippel, MD

## 2016-10-19 NOTE — Evaluation (Signed)
Clinical/Bedside Swallow Evaluation Patient Details  Name: [name] MRN: [id number] Date of Birth: [birth year]  Today's Date: 10/19/2016 Time: SLP Start Time (ACUTE ONLY): 0845 SLP Stop Time (ACUTE ONLY): 0925 SLP Time Calculation (min) (ACUTE ONLY): 40 min  Past Medical History:  Past Medical History:  Diagnosis Date  . Anemia   . Anxiety   . Arthritis    "all in my body, primarily in my hands" (08/31/2016)  . Bipolar disorder ([place])   . Chronic lower back pain   . Chronic pain syndrome    follows at [facility] pain [facility]  . COPD (chronic obstructive pulmonary disease) (HCC)    oxygen dependent (3L home continuous)  . DEFICIENCY, VITAMIN D NOS 01/03/2007  . Depression   . Duodenal ulcer, chronic   . Elevated liver function tests   . HCAP (healthcare-associated pneumonia) 08/31/2016  . History of blood transfusion 03/2014; 10/2014   "low HgB" (11/11/2014)  . Hyperthyroidism    "borderline"  . On home oxygen therapy    "3L; 24/7" (08/31/2016)  . OSTEOPOROSIS 06/17/2009   DEXA 05/2009 : L femur -2.9; R femur -2.5. Alendronate on med list but not taking. Needs addressed ASAP as h/o fractures.    . Pneumonia X 1?  . Sciatic pain   . Shortness of breath    "all the time" (02/27/2013)  . Substance abuse     narcotics, alcohol, tobacco  . Suicidal ideation 2007    attempted overdose 2012  Dr. Tivis Ringer report   Past Surgical History:  Past Surgical History:  Procedure Laterality Date  . ANKLE DEBRIDEMENT Left 10/2010  . AUGMENTATION MAMMAPLASTY Bilateral ~ 2007  . CATARACT EXTRACTION W/ INTRAOCULAR LENS  IMPLANT, BILATERAL Bilateral   . CESAREAN SECTION  1974; 1979  . ESOPHAGOGASTRODUODENOSCOPY N/A 11/07/2014   Procedure: ESOPHAGOGASTRODUODENOSCOPY (EGD);  Surgeon: Irene Shipper, MD;  Location: [facility] ENDOSCOPY;  Service: Endoscopy;  Laterality: N/A;  . FRACTURE SURGERY    . INTRAMEDULLARY (IM) NAIL INTERTROCHANTERIC Left 06/10/2016   Procedure: LEFT FEMUR INTRAMEDULLARY (IM) NAIL  INTERTROCHANTRIC;  Surgeon: Leandrew Koyanagi, MD;  Location: [place];  Service: Orthopedics;  Laterality: Left;  . ORIF ANKLE FRACTURE Left 10/2010  . TUBAL LIGATION  1979   HPI:  Pt is a 66 y.o. female with PMH of oxygen dependent chronic obstructive pulmonary disease, continued tobacco abuse, osteoporosis, and malnutrition who presents to the emergency department with progressive odynophagia, dysphasia, hoarseness, and shortness of breath. Developed dysphagia 3 months ago. Two weeks PTA dysphagia worsened, pt had hoarseness and increased odynophagia; over the past two days the pt hasn't been able to have anything by mouth, productive cough with brown sputum, increasing dyspnea and generalized weakness. Head/ neck CT showed "Extensive necrotic adenopathy involving the lower neck/upper chest, bilateral supraclavicular regions, and upper mediastinum as above, most concerning for nodal metastases. Nodal disease Sir brown scanning compresses the upper esophagus, with possible esophageal invasion not entirely excluded. Subglottic trachea displaced anteriorly but remains patent. Severe emphysema with irregular pleuroparenchymal scarring and thickening at the left lower lobe. Further evaluation with dedicated cross-sectional imaging of the chest recommended." Likely multifocal PNA. ENT note indicated "Possible bilateral cord paresis vs poor effort with adequate respirations and slightly phonatory vocal quality." This note also stated "barium swallow might be possible but would not do a modified swallow."   Assessment / Plan / Recommendation Clinical Impression  Pt at severe risk of aspiration of any consistency at this time given aphonia (suspect CN X affected impacting vocal  fold movement), odynophagia, immediate cough (weak) following trials of thin liquids/ some trials of ice chips, generalized weakness due to inadequate nutrition, likely esophageal phase impacted given findings on neck CT. Pt's oral cavity was very  dry with dried secretions; oral care with suction was provided prior to PO trials. Recommend that pt remain NPO with frequent oral care, allow 3-4 ice chips after oral care. SLP will continue following; will await to hear plan regarding nutrition, radiation, etc.  SLP Visit Diagnosis: Dysphagia, unspecified (R13.10)    Aspiration Risk  Severe aspiration risk;Risk for inadequate nutrition/hydration    Diet Recommendation NPO;Ice chips PRN after oral care   Medication Administration: Via alternative means Postural Changes: Seated upright at 90 degrees    Other  Recommendations Oral Care Recommendations: Oral care BID;Oral care prior to ice chip/H20 Other Recommendations: Have oral suction available;Clarify dietary restrictions   Follow up Recommendations Other (comment) (TBD)      Frequency and Duration min 2x/week  2 weeks       Prognosis Prognosis for Safe Diet Advancement: Guarded      Swallow Study   General HPI: Pt is a 66 y.o. female with PMH of oxygen dependent chronic obstructive pulmonary disease, continued tobacco abuse, osteoporosis, and malnutrition who presents to the emergency department with progressive odynophagia, dysphasia, hoarseness, and shortness of breath. Developed dysphagia 3 months ago. Two weeks PTA dysphagia worsened, pt had hoarseness and increased odynophagia; over the past two days the pt hasn't been able to have anything by mouth, productive cough with brown sputum, increasing dyspnea and generalized weakness. Head/ neck CT showed "Extensive necrotic adenopathy involving the lower neck/upper chest, bilateral supraclavicular regions, and upper mediastinum as above, most concerning for nodal metastases. Nodal disease Sir brown scanning compresses the upper esophagus, with possible esophageal invasion not entirely excluded. Subglottic trachea displaced anteriorly but remains patent. Severe emphysema with irregular pleuroparenchymal scarring and thickening at the  left lower lobe. Further evaluation with dedicated cross-sectional imaging of the chest recommended." Likely multifocal PNA. ENT note indicated "Possible bilateral cord paresis vs poor effort with adequate respirations and slightly phonatory vocal quality." This note also stated "barium swallow might be possible but would not do a modified swallow." Type of Study: Bedside Swallow Evaluation Previous Swallow Assessment: none in chart Diet Prior to this Study: NPO Temperature Spikes Noted: No Respiratory Status: Nasal cannula History of Recent Intubation: No Behavior/Cognition: Alert;Cooperative Oral Cavity Assessment: Dry;Dried secretions Oral Care Completed by SLP: Yes Oral Cavity - Dentition: Poor condition Vision: Functional for self-feeding Patient Positioning: Upright in bed Baseline Vocal Quality: Aphonic;Suspected CN X (Vagus) involvement Volitional Cough: Weak;Congested Volitional Swallow: Able to elicit    Oral/Motor/Sensory Function Overall Oral Motor/Sensory Function: Within functional limits   Ice Chips Ice chips: Impaired Presentation: Spoon Pharyngeal Phase Impairments: Cough - Immediate   Thin Liquid Thin Liquid: Impaired Presentation: Cup Pharyngeal  Phase Impairments: Cough - Immediate    Nectar Thick Nectar Thick Liquid: Not tested   Honey Thick Honey Thick Liquid: Not tested   Puree Puree: Not tested   Solid   GO   Solid: Not tested        Kern Reap, MA, CCC-SLP 10/19/2016,10:00 AM  [phone]

## 2016-10-20 LAB — BASIC METABOLIC PANEL
Anion gap: 15 (ref 5–15)
BUN: 9 mg/dL (ref 6–20)
CHLORIDE: 110 mmol/L (ref 101–111)
CO2: 28 mmol/L (ref 22–32)
Calcium: 10.6 mg/dL — ABNORMAL HIGH (ref 8.9–10.3)
Creatinine, Ser: 0.4 mg/dL — ABNORMAL LOW (ref 0.44–1.00)
GFR calc non Af Amer: >60 mL/min (ref >60)
GLUCOSE: 85 mg/dL (ref 65–99)
Potassium: 3.5 mmol/L (ref 3.5–5.1)
Sodium: 153 mmol/L — ABNORMAL HIGH (ref 135–145)

## 2016-10-20 MED ORDER — SODIUM CHLORIDE 0.9% FLUSH: 3.0000 mL | Freq: Two times a day (BID) | INTRAVENOUS | Status: DC

## 2016-10-20 MED ORDER — MAGIC MOUTHWASH W/LIDOCAINE: 15.0000 mL | Freq: Four times a day (QID) | ORAL | Status: DC | PRN

## 2016-10-20 MED ORDER — MORPHINE SULFATE (PF) 2 MG/ML IV SOLN: 1.0000 mg | INTRAVENOUS | Status: DC | PRN

## 2016-10-20 MED ORDER — MORPHINE SULFATE (CONCENTRATE) 10 MG/0.5ML PO SOLN: 5.0000 mg | ORAL | Status: DC | PRN

## 2016-10-20 MED ORDER — ACETAMINOPHEN 650 MG RE SUPP: 650.0000 mg | Freq: Four times a day (QID) | RECTAL | Status: DC | PRN

## 2016-10-20 MED ORDER — SODIUM CHLORIDE 0.9% FLUSH: 3.0000 mL | INTRAVENOUS | Status: DC | PRN

## 2016-10-20 MED ORDER — GLYCOPYRROLATE 0.2 MG/ML IJ SOLN: 0.2000 mg | INTRAMUSCULAR | Status: DC | PRN

## 2016-10-20 MED ORDER — GLYCOPYRROLATE 1 MG PO TABS: 1.0000 mg | ORAL_TABLET | ORAL | Status: DC | PRN

## 2016-10-20 MED ORDER — DEXTROSE-NACL 5-0.45 % IV SOLN
INTRAVENOUS | Status: DC
  Administered 2016-10-20 – 2016-10-21 (×2): via INTRAVENOUS

## 2016-10-20 MED ORDER — MORPHINE SULFATE (CONCENTRATE) 10 MG/0.5ML PO SOLN
5.0000 mg | ORAL | Status: DC | PRN
  Administered 2016-10-21: 5 mg via SUBLINGUAL
  Filled 2016-10-20: qty 0.5

## 2016-10-20 MED ORDER — ONDANSETRON 4 MG PO TBDP: 4.0000 mg | ORAL_TABLET | Freq: Four times a day (QID) | ORAL | Status: DC | PRN

## 2016-10-20 MED ORDER — MORPHINE SULFATE (PF) 2 MG/ML IV SOLN
2.0000 mg | INTRAVENOUS | Status: DC | PRN
  Administered 2016-10-20 – 2016-10-21 (×4): 2 mg via INTRAVENOUS
  Filled 2016-10-20 (×4): qty 1

## 2016-10-20 MED ORDER — ACETAMINOPHEN 325 MG PO TABS: 650.0000 mg | ORAL_TABLET | Freq: Four times a day (QID) | ORAL | Status: DC | PRN

## 2016-10-20 MED ORDER — POLYVINYL ALCOHOL 1.4 % OP SOLN
1.0000 [drp] | Freq: Four times a day (QID) | OPHTHALMIC | Status: DC | PRN
  Filled 2016-10-20: qty 15

## 2016-10-20 MED ORDER — ONDANSETRON HCL 4 MG/2ML IJ SOLN: 4.0000 mg | Freq: Four times a day (QID) | INTRAMUSCULAR | Status: DC | PRN

## 2016-10-20 MED ORDER — BIOTENE DRY MOUTH MT LIQD: 15.0000 mL | OROMUCOSAL | Status: DC | PRN

## 2016-10-20 MED ORDER — SODIUM CHLORIDE 0.9 % IV SOLN: 250.0000 mL | INTRAVENOUS | Status: DC | PRN

## 2016-10-20 NOTE — Progress Notes (Signed)
[facility] Liaison:  RN visit  Received request from [place], [place], for family interest in [facility].  Chart reviewed and spoke with family to acknowledge referral.  Unfortunately, [facility] is not able to offer a room today.  Family and CSW are aware.  [facility] will follow up with CSW and family tomorrow or sooner if room becomes available.  Please do not hesitate to call with questions.  Thank you for the referral.  Edyth Gunnels, RN, BSN [facility] Liaison [phone]  All hospital liaisons are now on [place].

## 2016-10-20 NOTE — Progress Notes (Signed)
Palliative Medicine RN Note: Called sister/POA [name] to set up meeting for this afternoon with PMT PA Florentina Jenny. Stanton Kidney told me that they don't need to meet with [place], as they want only hospice care, want her to go inpatient, and want her to go to [facility]. Offered other locations/choice, but family wants [facility]. Order placed per Haynes Dage, and notified Amy with [facility]. She will reach out to sister.  Marjie Skiff Harlean Regula, RN, BSN, [facility] 10/20/2016 11:16 AM Cell [phone] 8:00-4:00 Monday-Friday Office [phone]

## 2016-10-20 NOTE — Progress Notes (Signed)
IR met with patient and sister at bedside.   Patient was lethargic and unable to arouse for conservation.  Sister attempted to awaken several times for conversation but [name] kept falling asleep.  Sister reports [name] has thus far been interested in pursuing Palliative/hospice only.  She has been refusing medications and lab draws in the hospital today.  Sister states they are considering palliative radiation to improve her ability to swallow.  Case reviewed with Dr. Laurence Ferrari.  A lymph node biopsy is feasible, however may not be indicated based on goals of care and current treatment goals.  No procedure planned.  Will cancel consult request at this time.   Brynda Greathouse, MMS RDN PA-C 8:40 AM

## 2016-10-20 NOTE — Progress Notes (Signed)
SLP Cancellation Note  Patient Details Name: NARE GASPARI MRN: [id number] DOB: 31-Jul-1950   Cancelled treatment:       Reason Eval/Treat Not Completed: Other (comment). Given today's notes pt is resting peacefully and will likely transition to full comfort measures with Hospice care. Will sign off, if further needs arise please reorder.   Herbie [place], [place] CCC-SLP [phone]  Lynann Beaver 10/20/2016, 11:36 AM

## 2016-10-20 NOTE — Progress Notes (Signed)
Internal Medicine Attending  Date: 10/20/2016  Patient name: [name] Medical record number: [id number] Date of birth: [birth year] Age: 66 y.o. Gender: female  I saw and evaluated the patient. I reviewed the resident's note by Dr. Berneice Gandy and I agree with the resident's findings and plans as documented in her progress note.  The decision is made to transition [name] to comfort measures only. This is a very reasonable decision and I believe she is currently in the process of actively dying. She appears to be comfortable at this time and we will continue to focus on her comfort. Hopefully, a bed at the [facility] will be available soon.

## 2016-10-20 NOTE — Progress Notes (Signed)
CSW received referral for residential hospice. Patient has requested [employer] [facility]. Referral sent to [facility] for assessment. CSW to continue to follow for discharge needs.   Percell Locus Chamberlain Steinborn LCSWA [phone]

## 2016-10-20 NOTE — Progress Notes (Signed)
   Subjective:  The patient was laying comfortably in bed this morning. She was difficult to arouse but able to speak intermittently during exam. Her sister was bedside and they have talked about foregoing a biopsy for tissue diagnosis. They have also decided to forego palliative radiation.  Objective:  Vital signs in last 24 hours: Vitals:   10/20/16 0458 10/20/16 0738 10/20/16 1234 10/20/16 1627  BP: 131/81     Pulse: (!) 58 75 76 72  Resp: 18 16 16 18   Temp: 98.9 F (37.2 C)     TempSrc:      SpO2: 94% 95% 96% 95%  Weight:      Height:       Physical Exam  Constitutional:  Thin, cachectic appearing woman laying comfortably in bed wearing nasal cannula.  HENT:  Oropharynx moist, improved from yesterday's exam. Vaseline on lips.  Abdominal: Soft. She exhibits no distension. There is no tenderness.  Musculoskeletal: She exhibits no edema (of bilateral lower extremities) or tenderness (of bilateral lower extremities).  Skin: Skin is dry. No rash noted. No erythema. There is pallor.   Assessment/Plan:  Principal Problem:   Dysphagia Active Problems:   Vitamin D deficiency   Chronic back pain   Osteoporosis   Bipolar I disorder, most recent episode depressed (HCC)   COPD (chronic obstructive pulmonary disease) with emphysema (HCC)   Protein-calorie malnutrition, severe (HCC)   GERD (gastroesophageal reflux disease)   Iron deficiency anemia   Smoking   Pressure ulcer   Pneumonia   Hypokalemia   Sore throat and laryngitis   Hypercalcemia   Acute on chronic respiratory failure with hypoxia (HCC)   Enlarged lymph node  Ms Waddle is a 66 yo with a PMH of COPD on 3L home oxygen, tobacco abuse, and HCAP on 08/31/2016 who presented to the ED on 10/17/2016 with progressive dysphagia for 3 months, shortness of breath, and hoarseness. The specific problems addressed during the admission are as follows:  The patient now desires comfort care and we will facilitate this pending  transfer to inpatient hospice.  Aspiration pneumonia in setting of severe emphysema: The patient had a CXR showing a new RLL consolidation which, in the setting of progressive dysphagia, is concerning for aspiration. She was previously treated with zosyn for this aspiration pneumonia, but antibiotics were stopped secondary to comfort care.  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 This is likely the cause of her progressive dysphagia. She is now comfort care and can have liquids/foods for comfort.  Hypercalcemia: Will continue fluids for hydration, comfort, and prevention of altered mental status associated with hypercalcemia.  Hypokalemia with metabolic alkalosis: Patient continues to have metabolic derangements with lactated ringers likely 2/2 need of free water. Will continue fluids for hydration and comfort.  Dispo: Anticipated discharge in approximately 1 day(s) to [facility] for in hospital hospice.   Thomasene Ripple, MD 10/20/2016, 5:31 PM Pager: [phone]

## 2016-10-20 NOTE — Progress Notes (Addendum)
No charge note.  Discussed with Rad/Onc and Patient's sister, [name].    [name] is inclined to avoid biopsy and radiation.  She would like for the patient to receive Hospice services - quickly.  After speaking with rad onc - there is concern that the patient may be starting the process of dying.  Palliative Medicine is available to the Teaching Service as well as the patient and family.  Please call us on [phone] if we can be of assistance.  Florentina Jenny, PA-C Palliative Medicine Pager: [phone]

## 2016-10-20 NOTE — Progress Notes (Signed)
10/20/2016 9:56 AM  [name] [id number]     Temp:  [97.7 F (36.5 C)-98.9 F (37.2 C)] 98.9 F (37.2 C) (08/09 0458) Pulse Rate:  [58-88] 75 (08/09 0738) Resp:  [16-20] 16 (08/09 0738) BP: (131-163)/(78-84) 131/81 (08/09 0458) SpO2:  [92 %-95 %] 95 % (08/09 0738),     Intake/Output Summary (Last 24 hours) at 10/20/16 0956 Last data filed at 10/20/16 5809  Gross per 24 hour  Intake             1965 ml  Output                0 ml  Net             1965 ml    Results for orders placed or performed during the hospital encounter of 10/17/16 (from the past 24 hour(s))  Basic metabolic panel     Status: Abnormal   Collection Time: 10/19/16  6:21 PM  Result Value Ref Range   Sodium 152 (H) 135 - 145 mmol/L   Potassium 2.8 (L) 3.5 - 5.1 mmol/L   Chloride 106 101 - 111 mmol/L   CO2 35 (H) 22 - 32 mmol/L   Glucose, Bld 87 65 - 99 mg/dL   BUN 11 6 - 20 mg/dL   Creatinine, Ser 0.46 0.44 - 1.00 mg/dL   Calcium 10.7 (H) 8.9 - 10.3 mg/dL   GFR calc non Af Amer >60 >60 mL/min   GFR calc Af Amer >60 >60 mL/min   Anion gap 11 5 - 15  Basic metabolic panel     Status: Abnormal   Collection Time: 10/20/16  5:24 AM  Result Value Ref Range   Sodium 153 (H) 135 - 145 mmol/L   Potassium 3.5 3.5 - 5.1 mmol/L   Chloride 110 101 - 111 mmol/L   CO2 28 22 - 32 mmol/L   Glucose, Bld 85 65 - 99 mg/dL   BUN 9 6 - 20 mg/dL   Creatinine, Ser 0.40 (L) 0.44 - 1.00 mg/dL   Calcium 10.6 (H) 8.9 - 10.3 mg/dL   GFR calc non Af Amer >60 >60 mL/min   GFR calc Af Amer >60 >60 mL/min   Anion gap 15 5 - 15    SUBJECTIVE:  More difficult to arouse.  Responding to analgesics  OBJECTIVE:  Quiet, breathing easily. No obvious signs of distress.    IMPRESSION:  Appears to be fading  PLAN:   Discussed with sister, supporting her loving and heartfelt decision making.  I emphasized that even palliative efforts were likely to be difficult and unpredictable as to success.  I assured her that her sister did  not seem to be suffering, and that a decision for strictly palliative pain relief and sedation was very reasonable.  I still think Hospice would be a valuable resource.  I don't know if they have been successful in contacting her Doristine Bosworth.    I am available if needed.    Jodi Marble

## 2016-10-20 NOTE — Progress Notes (Signed)
I spoke with the patient's sister Stanton Kidney her [facility] and the patient has been persistently somnolent, difficult to rouse, and has only had two doses of morphine at [facility] yesterday, and at 2253 last night. The same lethargy has been witnessed by nursing and the patient is not communicating much with the staff or her sister. At this point I'm suspicious that she's transitioning and in discussing with the patient's sister, she is requesting hospice only at this point. We will sign off, but Stanton Kidney has my contact information if things clinically change.     Carola Rhine, PAC

## 2016-10-21 MED ORDER — MORPHINE SULFATE (CONCENTRATE) 10 MG/0.5ML PO SOLN: 5.0000 mg | ORAL | 0 refills | Status: AC | PRN

## 2016-10-21 NOTE — Progress Notes (Signed)
Nutrition Brief Note  Chart reviewed. Pt now transitioning to comfort care.  No further nutrition interventions warranted at this time.  Please re-consult as needed.   Jennesis Ramaswamy A. Crestina Strike, RD, LDN, CDE Pager: [phone] After hours Pager: [phone]  

## 2016-10-21 NOTE — Progress Notes (Signed)
Patient has a bed available at [facility] today.  [name] LCSWA [phone]

## 2016-10-21 NOTE — Progress Notes (Signed)
[facility] Liaison:  RN visit  Received request from [place], [place] for family interest in [facility] with request for tranfer today.  Chart reviewed.  Met with Stanton Kidney, sister to confirm interest and explain services.  Family agreeable to transfer today.  Percell Locus, [place], aware.  Registration paperwork completed today.  Dr. Orpah Melter to assume care per family request.  Please fax discharge summary to [phone].  RN, please call report to [phone].  Please arrange transport for patient to arrive as soon as possible.   Thank you for the referral.  Edyth Gunnels, RN, [facility] Liaison [phone]  All hospital liaisons are now on [place].

## 2016-10-21 NOTE — Discharge Summary (Signed)
Name: [name] MRN: [id number] DOB: 22-Mar-1950 66 y.o. PCP: Lars Mage, MD  Date of Admission: 10/17/2016  2:59 PM Date of Discharge: 10/21/2016 Attending Physician: Oval Linsey, MD  Discharge Diagnosis:  Principal Problem:   Dysphagia Active Problems:   Vitamin D deficiency   Chronic back pain   Osteoporosis   Bipolar I disorder, most recent episode depressed ([place])   COPD (chronic obstructive pulmonary disease) with emphysema ([place])   Protein-calorie malnutrition, severe (HCC)   GERD (gastroesophageal reflux disease)   Iron deficiency anemia   Smoking   Pressure ulcer   Pneumonia   Hypokalemia   Sore throat and laryngitis   Hypercalcemia   Acute on chronic respiratory failure with hypoxia ([place])   Enlarged lymph node   Discharge Medications: Allergies as of 10/21/2016      Reactions   Banana Shortness Of Breath, Nausea And Vomiting   Lyrica [pregabalin] Other (See Comments)   "Disorients me"   Pollen Extract Other (See Comments)   Seasonal       Medication List    STOP taking these medications   alendronate 10 MG tablet Commonly known as:  FOSAMAX   Cholecalciferol 1000 units Tbdp     TAKE these medications   albuterol 108 (90 Base) MCG/ACT inhaler Commonly known as:  PROAIR HFA INHALE ONE PUFF BY MOUTH EVERY 2 TO 4 HOURS AS NEEDED FOR WHEEZING   fluticasone 50 MCG/ACT nasal spray Commonly known as:  FLONASE Place 2 sprays into both nostrils daily.   Fluticasone-Salmeterol 250-50 MCG/DOSE Aepb Commonly known as:  ADVAIR DISKUS INHALE 1 PUFF INTO THE LUNGS 2 TIMES DAILY   gabapentin 300 MG capsule Commonly known as:  NEURONTIN Take 300 mg by mouth at bedtime.   HYDROcodone-acetaminophen 5-325 MG tablet Commonly known as:  NORCO/VICODIN Take 1-2 tablets by mouth every 4 (four) hours as needed for moderate pain.   ipratropium-albuterol 0.5-2.5 (3) MG/3ML Soln Commonly known as:  DUONEB 4 times a day (breakfast, lunch, dinner,  bedtime).Can take 2 additional treatments if needed on bad days. DX J43.8 What changed:  how much to take  how to take this  when to take this  additional instructions   lamoTRIgine 100 MG tablet Commonly known as:  LAMICTAL TAKE ONE TABLET EVERY DAY   levocetirizine 5 MG tablet Commonly known as:  XYZAL Take 1 tablet (5 mg total) by mouth at bedtime.   Melatonin 10 MG Tabs Take 1 tablet by mouth at bedtime as needed.   morphine CONCENTRATE 10 MG/0.5ML Soln concentrated solution Place 0.25 mLs (5 mg total) under the tongue every 2 (two) hours as needed for moderate pain (or dyspnea).   OLANZapine 2.5 MG tablet Commonly known as:  ZYPREXA Take 2.5 mg by mouth at bedtime.   ondansetron 4 MG tablet Commonly known as:  ZOFRAN Take 1 tablet (4 mg total) by mouth every 8 (eight) hours as needed for nausea or vomiting.   OXYGEN Inhale 3 L into the lungs continuous.   pantoprazole 40 MG tablet Commonly known as:  PROTONIX Take 1 tablet (40 mg total) by mouth daily.   polyethylene glycol powder powder Commonly known as:  GLYCOLAX/MIRALAX TAKE 17G DISSOLVED IN 8OZ WATER DAILY ASNEEDED FOR MODERATE CONSTIPATION   venlafaxine XR 37.5 MG 24 hr capsule Commonly known as:  EFFEXOR-XR Take 37.5 mg by mouth every evening.   white petrolatum Gel Commonly known as:  VASELINE Apply 1 application topically daily. To both feet for dry skin  Disposition and follow-up:   [name] was discharged from [facility] in Stable condition.  At the hospital follow up visit please address:  1.  Patient made comfort care only on 10/20/2016. The patient responded well to IV morphine during hospitalization and clinically appeared more comfortable with IV fluid therapy. Thank you for your assistance with comfort care measures.   2.  Labs / imaging needed at time of follow-up: None  3.  Pending labs/ test needing follow-up: None  Follow-up  Appointments: [place] Hospital Course by problem list: Principal Problem:   Dysphagia Active Problems:   Vitamin D deficiency   Chronic back pain   Osteoporosis   Bipolar I disorder, most recent episode depressed (HCC)   COPD (chronic obstructive pulmonary disease) with emphysema (HCC)   Protein-calorie malnutrition, severe (HCC)   GERD (gastroesophageal reflux disease)   Iron deficiency anemia   Smoking   Pressure ulcer   Pneumonia   Hypokalemia   Sore throat and laryngitis   Hypercalcemia   Acute on chronic respiratory failure with hypoxia (HCC)   Enlarged lymph node   Ms Placzek is a 66 yo with a PMH of COPD on 3L home oxygen, tobacco abuse, and HCAP on 08/31/2016 who presented to the ED on 10/17/2016 with progressive dysphagia for 3 months, shortness of breath, and hoarseness. A chest Xray on admission was consistent for RLL pneumonia 2/2 aspiration. A CT of neck on admission showed multiple nodal masses throughout the anterior mediastinum concerning for metastasis. The patient did not wish to pursue tissue diagnosis and wanted to maximize comfort during her hospital stay. She was made comfort care only on 8/9. The specific problems addressed during the admission are as follows:  Aspiration pneumonia with severe emphysema:She was previously treated with zosyn for this aspiration pneumonia, but antibiotics were stopped on HD#3 as the patient and family decided to proceed with comfort care only.   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 This is likely the cause of her progressive dysphagia. Early in the hospital course ENT and interventional radiology were consulted for assistance with tissue biopsy, however the family made the decision to forego this diagnostic testing so that the patient could avoid invasive, potentially harmful procedures. The patient and her family  spoke with radiation oncology regarding palliative RT for her neck masses causing dysphagia, however the family decided to not pursue this avenue 2/2 patient weakness and thought she would not tolerate this therapy. The patient was NPO early in hospitalization 2/2 dysphagia and risk of aspiration, but switched to food/liquid for comfort on HD#3.   Hypercalcemia: Patient presented with AMS and calcium of 14.0 on admission. She was given zoledronic acid and calcitonin in the ED, which normalized her calcium levels by HD#1. She was given fluids throughout hospitalization to prevent rise in calcium and associated AMS.   Hypokalemia with metabolic alkalosis: She presented with metabolic acidosis and electrolyte abnormalities consistent with contraction alkalosis. She was given NS and LR during hospitalization with electrolyte replacement. She was also given free water replacement for hypernatremia that unresponsive to above therapies. The patient's clinic appearance improved with hydration throughout her stay.   Discharge Vitals:   BP (!) 150/54 (BP Location: Left Arm)   Pulse (!) 106   Temp (!) 97.4 F (36.3 C) (Oral)   Resp 17   Ht 5\' 1"  (1.549 m)   Wt 78 lb  11.3 oz (35.7 kg)   SpO2 95%   BMI 14.87 kg/m   Pertinent Labs, Studies, and Procedures:  BMP Latest Ref Rng & Units 10/20/2016 10/19/2016 10/19/2016  Glucose 65 - 99 mg/dL 85 87 86  BUN 6 - 20 mg/dL 9 11 10   Creatinine 0.44 - 1.00 mg/dL 0.40(L) 0.46 0.46  BUN/Creat Ratio 12 - 28 - - -  Sodium 135 - 145 mmol/L 153(H) 152(H) 153(H)  Potassium 3.5 - 5.1 mmol/L 3.5 2.8(L) 2.9(L)  Chloride 101 - 111 mmol/L 110 106 104  CO2 22 - 32 mmol/L 28 35(H) 38(H)  Calcium 8.9 - 10.3 mg/dL 10.6(H) 10.7(H) 11.0(H)   CT Neck with contrast: IMPRESSION: 1. Extensive necrotic adenopathy involving the lower neck/upper chest, bilateral supraclavicular regions, and upper mediastinum as above, most concerning for nodal metastases. Nodal disease Sir  brown scanning compresses the upper esophagus, with possible esophageal invasion not entirely excluded. Subglottic trachea displaced anteriorly but remains patent. 2. Severe emphysema with irregular pleuroparenchymal scarring and thickening at the left lower lobe. Further evaluation with dedicated cross-sectional imaging of the chest recommended.  Electronically Signed   By: Jeannine Boga M.D.   On: 10/18/2016 00:16  Discharge Instructions: Discharge Instructions    Activity as tolerated - No restrictions    Complete by:  As directed    Diet general    Complete by:  As directed       Signed: Thomasene Ripple, MD 10/21/2016, 10:19 AM   Pager: [phone]

## 2016-10-21 NOTE — Progress Notes (Signed)
Internal Medicine Attending  Date: 10/21/2016  Patient name: [name] Medical record number: [id number] Date of birth: [birth year] Age: 66 y.o. Gender: female  I saw and evaluated the patient. I reviewed the resident's note by Dr. Berneice Gandy and I agree with the resident's findings and plans as documented in her progress note.  When seen on rounds this morning [name] was lying comfortably in bed and did not interact with the care team. A bed at [facility] is available and she will be discharged there for further end-of-life care.

## 2016-10-21 NOTE — Progress Notes (Signed)
Subjective:  Patient seen laying comfortably in her bed on rounds this morning. She was easily awoken but had limited interaction with the interviewer. She had blankets in place and did not appear to be in pain.  Objective:  Vital signs in last 24 hours: Vitals:   10/20/16 1627 10/20/16 2050 10/20/16 2217 10/21/16 0553  BP:   (!) 158/79 (!) 150/54  Pulse: 72  (!) 109 (!) 106  Resp: 18  18 17   Temp:   97.7 F (36.5 C) (!) 97.4 F (36.3 C)  TempSrc:   Oral Oral  SpO2: 95% 96% 91% 95%  Weight:      Height:       Physical Exam  Constitutional:  Thin, frail appearing woman laying in bed in no acute distress.  HENT:  Oropharynx moist with vaseline on lips.   Cardiovascular: Normal rate, regular rhythm and normal heart sounds.   No murmur heard. Pulmonary/Chest: Breath sounds normal. No respiratory distress. She has no wheezes.  Abdominal: Soft. She exhibits no distension. There is no tenderness. There is no guarding.  Musculoskeletal: She exhibits no edema (of lower extremities) or tenderness (of upper and lower extremities).  Neurological:  Easily aroused but quickly returns back to sleep without answering questions and exam participation.  Skin: Skin is warm and dry. No rash noted. There is pallor.   Assessment/Plan:  Principal Problem:   Dysphagia Active Problems:   Vitamin D deficiency   Chronic back pain   Osteoporosis   Bipolar I disorder, most recent episode depressed (HCC)   COPD (chronic obstructive pulmonary disease) with emphysema (HCC)   Protein-calorie malnutrition, severe (HCC)   GERD (gastroesophageal reflux disease)   Iron deficiency anemia   Smoking   Pressure ulcer   Pneumonia   Hypokalemia   Sore throat and laryngitis   Hypercalcemia   Acute on chronic respiratory failure with hypoxia (HCC)   Enlarged lymph node  [name] is a 66 yo with a PMH of COPD on 3L home oxygen, tobacco abuse, and HCAP on 08/31/2016 who presented to the ED on 10/17/2016  with progressive dysphagia for 3 months, shortness of breath, and hoarseness. A chest Xray on admission was consistent for RLL pneumonia 2/2 aspiration. A CT of neck on admission showed multiple nodal masses throughout the anterior mediastinum concerning for metastasis. Early in the hospital course ENT and interventional radiology were consulted for assistance with tissue biopsy, however the family made the decision to forego these procedures so that the patient could avoid invasive, potentially harmful procedures. The patient and her family spoke with radiation oncology regarding palliative RT for her neck masses causing dysphagia, however the family decided to not pursue this avenue 2/2 patient weakness and thought she would not tolerate this therapy. She was made comfort care only on 10/20/2016. The specific problems addressed during the admission are as follows:  Aspiration pneumonia with severe emphysema:She was previously treated with zosyn for this aspiration pneumonia, but antibiotics were stopped 8/9 as the patient and family decided to proceed with comfort care only.   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 This is likely the cause of her progressive dysphagia. She will continue to have liquids/foods for comfort.  Hypercalcemia: We will continue fluids for hydration, comfort, and prevention of altered mental status associated with hypercalcemia.  Hypokalemia with metabolic alkalosis: We wiill continue fluids and replacement of free water for comfort.  Diet:  Previously NPO 2/2 aspiration risk; now comfort care so will encourage food for comfort.   Dispo: Anticipated discharge today to [facility] for inpatient hospice.   Thomasene Ripple, MD 10/21/2016, 9:57 AM Pager: [phone]

## 2016-10-21 NOTE — Progress Notes (Signed)
   10/21/16 0940  Clinical Encounter Type  Visited With Patient and family together  Visit Type Other (Comment) ([place] consult)  Spiritual Encounters  Spiritual Needs Emotional  Stress Factors  Patient Stress Factors Health changes  Family Stress Factors None identified  Introduction to Pt and sister. [employer] of presence.

## 2016-10-21 NOTE — Progress Notes (Signed)
Patient will DC to: [facility] Anticipated DC date: 10/21/16  Family notified: Sister Transport by: Corey Harold   Per MD patient ready for DC to [facility]. RN, patient, patient's family, and facility notified of DC. Discharge Summary sent to facility. RN given number for report [phone]). DC packet on chart. Ambulance transport requested for patient.   CSW signing off.  Cedric Fishman, [place] Social Worker [phone]

## 2016-10-21 NOTE — Progress Notes (Signed)
[name] to be D/C'd Skilled nursing facility per MD order.  Discussed with the patient and all questions fully answered.  VSS. IV catheter discontinued intact. Site without signs and symptoms of complications. Dressing and pressure applied.  Report called to Caryl Asp, RN at [facility]. All questions answered.  Pt taken by PTAR.  [place] 10/21/2016 12:39 PM

## 2016-10-21 NOTE — Care Management Important Message (Signed)
Important Message  Patient Details  Name: [name] MRN: [id number] Date of Birth: March 05, 1951   Medicare Important Message Given:  Yes    Nathen May 10/21/2016, 9:40 AM

## 2016-11-12 DEATH — deceased

## 2017-08-17 IMAGING — CT CT HEAD W/O CM
4 series · 17 of 47 positions shown, 19 images · non-contrast
Comparison: Head CT without contrast 12/18/2015 and earlier.

CLINICAL DATA: 64-year-old female status post fall out of bed 5
days ago. Headaches, right eye bruising, right frontal scalp
hematoma. Initial encounter.

EXAM:
CT HEAD WITHOUT CONTRAST
TECHNIQUE: Contiguous axial images were obtained from the base of the skull
through the vertex without intravenous contrast.

[Series 3: head bone · axial · 0.49mm/px · z∈[-46,-10]mm · 3 of 67 slices shown]
[im 7/67  bone]
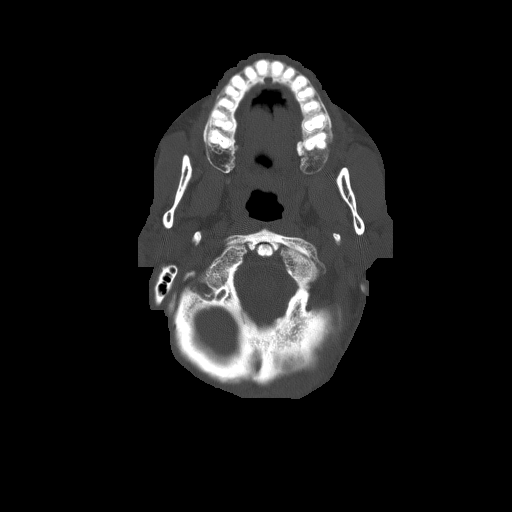
[im 14/67  bone]
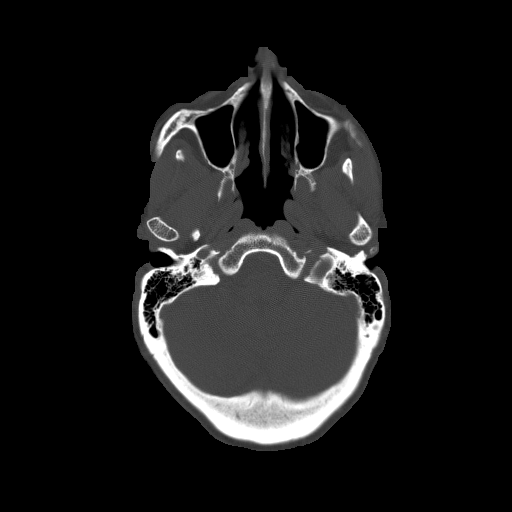
[im 21/67  bone]
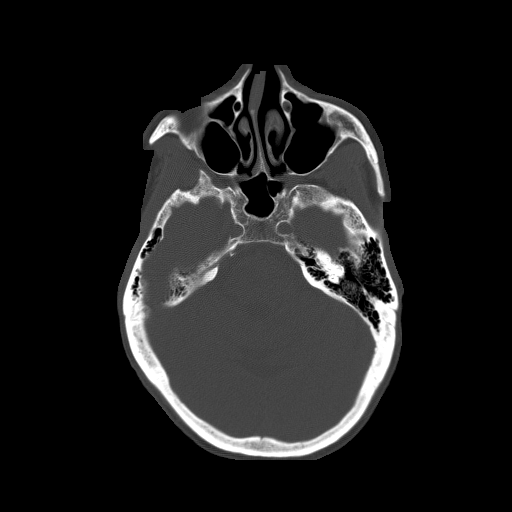

[Series 32: 3d filtered head w/o · axial · non-contrast · 0.49mm/px · z∈[-46,+84]mm · 8 of 34 slices shown, 10 images]
[im 4/34  brain]
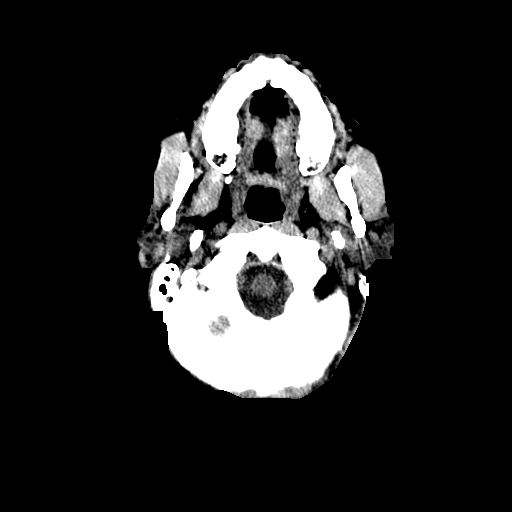
[im 4/34  bone]
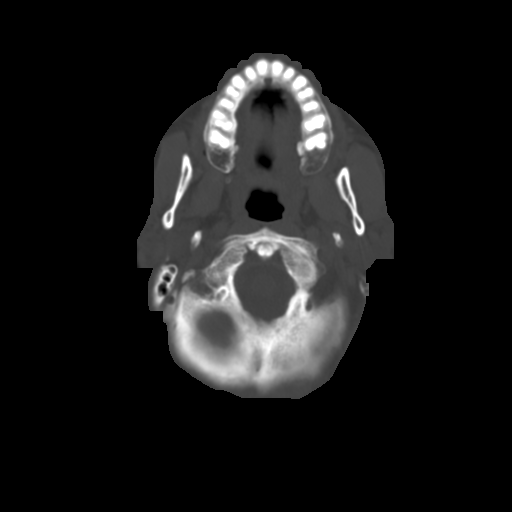
[im 8/34  brain]
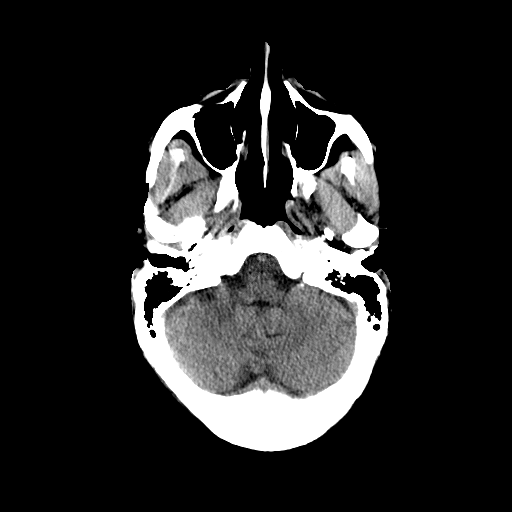
[im 12/34  brain]
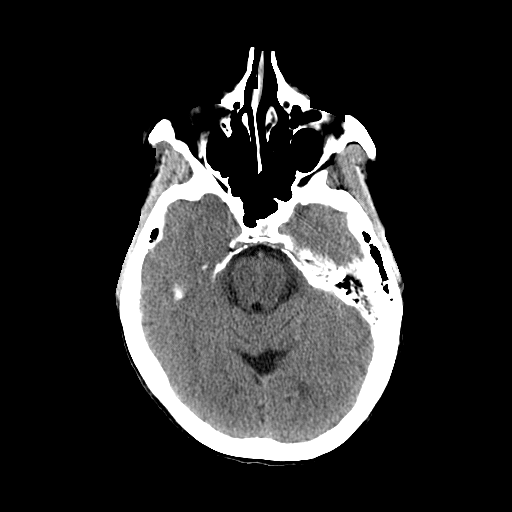
[im 15/34  brain]
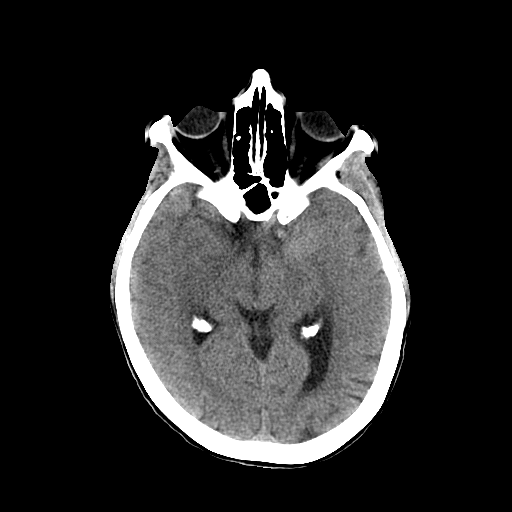
[im 19/34  brain]
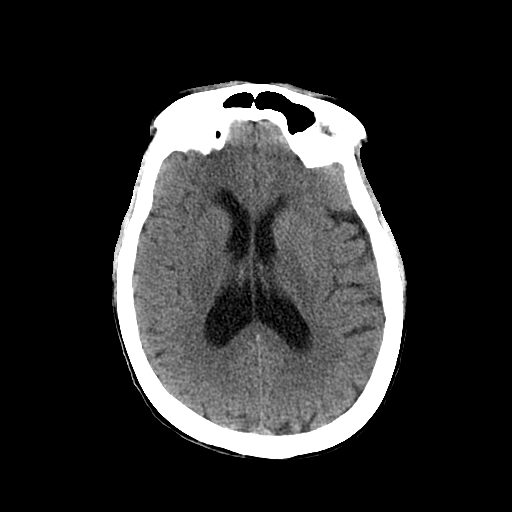
[im 19/34  bone]
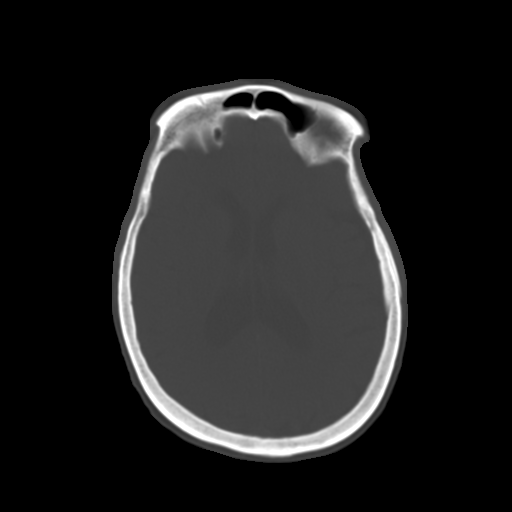
[im 23/34  brain]
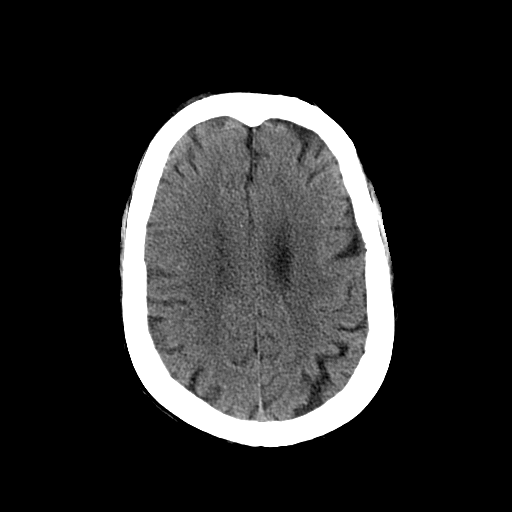
[im 26/34  brain]
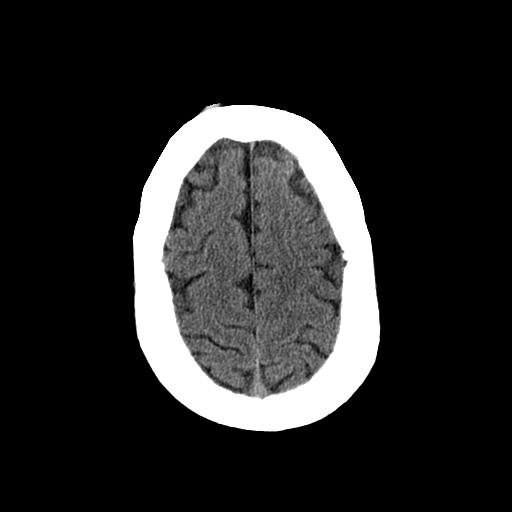
[im 30/34  brain]
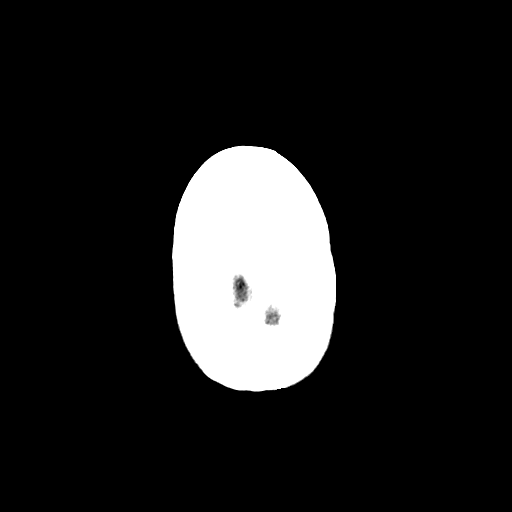

[Series 601: coronal brain · coronal · 0.49mm/px · 3 of 76 slices shown]
[im 26/76  brain]
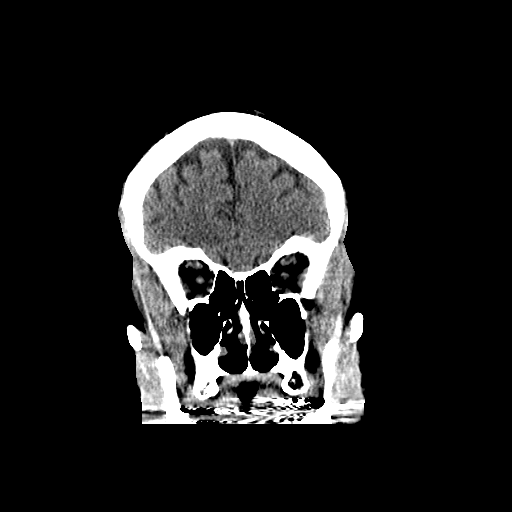
[im 34/76  brain]
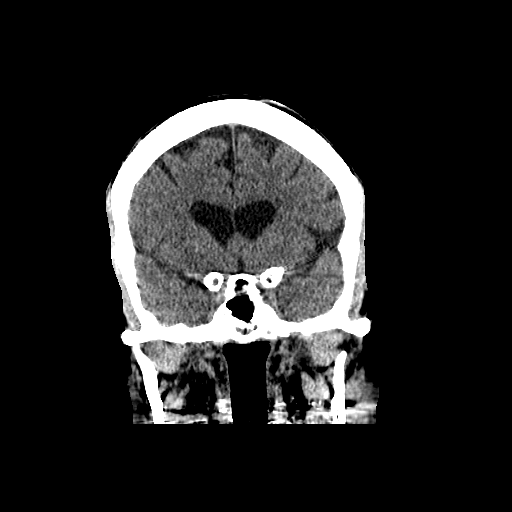
[im 42/76  brain]
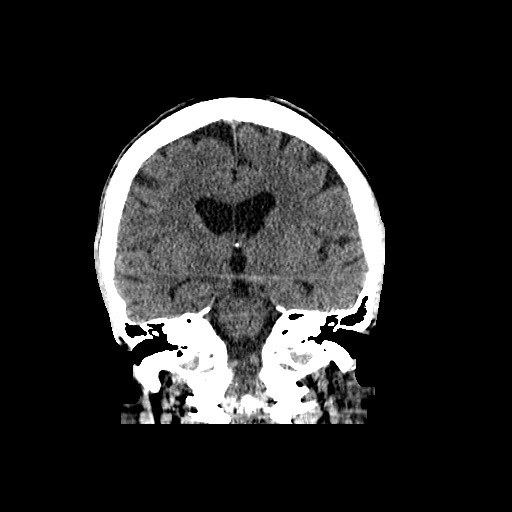

[Series 602: sagittal brain · sagittal · 0.49mm/px · 3 of 59 slices shown]
[im 20/59  brain]
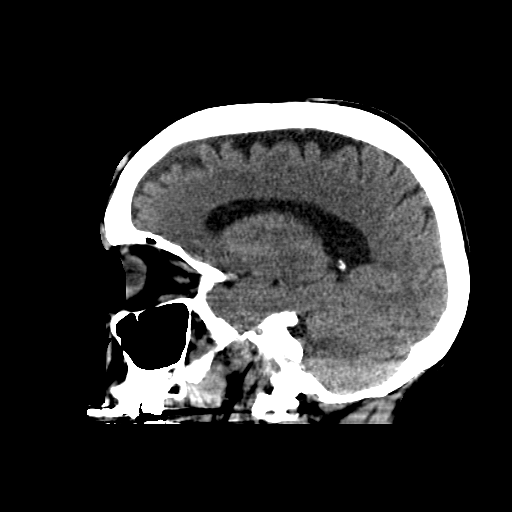
[im 30/59  brain]
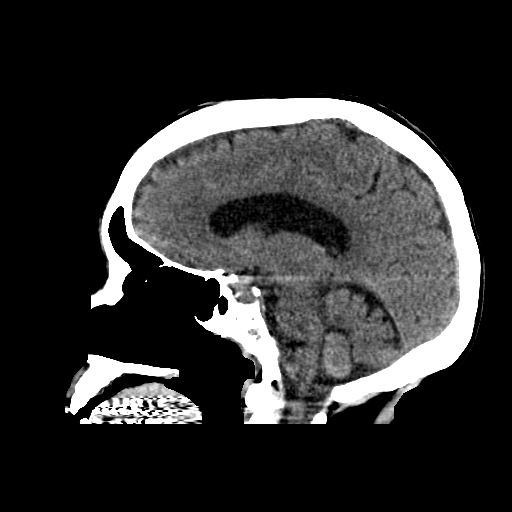
[im 39/59  brain]
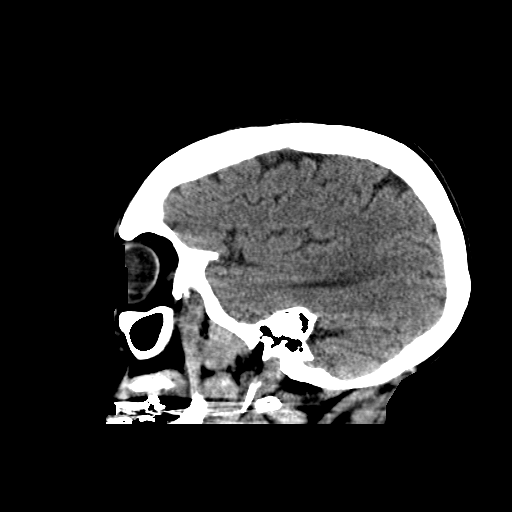

[17 of 47 positions shown; findings below may reference images not displayed]

FINDINGS: Brain: No midline shift, ventriculomegaly, mass effect, evidence of
mass lesion, intracranial hemorrhage or evidence of cortically based
acute infarction. Gray-white matter differentiation is within normal
limits throughout the brain.

Vascular: Mild Calcified atherosclerosis at the skull base. No
suspicious intracranial vascular hyperdensity.

Skull: Intact calvarium.  No acute osseous abnormality identified.

Sinuses/Orbits: Visualized paranasal sinuses and mastoids are stable
and well pneumatized. Incidental right ethmoid osteoma re-
demonstrated (inconsequential).

Other: Regressed but not resolved right forehead scalp hematoma.
Stable and negative orbits soft tissues.

Other visible scalp and face soft tissues are within normal limits;
incidental small left forehead scalp lipoma.
IMPRESSION: 1. Stable and normal for age non contrast CT appearance of the
brain.
2. Regressed right frontal scalp hematoma. No underlying skull
fracture.
3. No other traumatic injury identified.
